# Patient Record
Sex: Male | Born: 1975
Health system: Southern US, Community
[De-identification: ages and names within clinical notes are randomized; demographics above are authoritative.]

## PROBLEM LIST (undated history)

## (undated) DIAGNOSIS — E11621 Type 2 diabetes mellitus with foot ulcer: Secondary | ICD-10-CM

## (undated) DIAGNOSIS — E349 Endocrine disorder, unspecified: Secondary | ICD-10-CM

## (undated) DIAGNOSIS — N183 Chronic kidney disease, stage 3 unspecified: Secondary | ICD-10-CM

## (undated) DIAGNOSIS — T8781 Dehiscence of amputation stump: Secondary | ICD-10-CM

## (undated) DIAGNOSIS — M869 Osteomyelitis, unspecified: Secondary | ICD-10-CM

## (undated) DIAGNOSIS — N289 Disorder of kidney and ureter, unspecified: Secondary | ICD-10-CM

## (undated) DIAGNOSIS — M5136 Other intervertebral disc degeneration, lumbar region: Secondary | ICD-10-CM

## (undated) DIAGNOSIS — K219 Gastro-esophageal reflux disease without esophagitis: Secondary | ICD-10-CM

## (undated) DIAGNOSIS — I999 Unspecified disorder of circulatory system: Secondary | ICD-10-CM

## (undated) DIAGNOSIS — E785 Hyperlipidemia, unspecified: Secondary | ICD-10-CM

## (undated) DIAGNOSIS — M51369 Other intervertebral disc degeneration, lumbar region without mention of lumbar back pain or lower extremity pain: Secondary | ICD-10-CM

## (undated) DIAGNOSIS — R519 Headache, unspecified: Secondary | ICD-10-CM

## (undated) DIAGNOSIS — K3184 Gastroparesis: Secondary | ICD-10-CM

## (undated) DIAGNOSIS — E119 Type 2 diabetes mellitus without complications: Secondary | ICD-10-CM

## (undated) DIAGNOSIS — D649 Anemia, unspecified: Secondary | ICD-10-CM

## (undated) DIAGNOSIS — I739 Peripheral vascular disease, unspecified: Secondary | ICD-10-CM

## (undated) DIAGNOSIS — F1910 Other psychoactive substance abuse, uncomplicated: Secondary | ICD-10-CM

## (undated) DIAGNOSIS — M86179 Other acute osteomyelitis, unspecified ankle and foot: Secondary | ICD-10-CM

## (undated) DIAGNOSIS — L97509 Non-pressure chronic ulcer of other part of unspecified foot with unspecified severity: Secondary | ICD-10-CM

## (undated) DIAGNOSIS — I10 Essential (primary) hypertension: Secondary | ICD-10-CM

## (undated) DIAGNOSIS — M359 Systemic involvement of connective tissue, unspecified: Secondary | ICD-10-CM

## (undated) DIAGNOSIS — Z89431 Acquired absence of right foot: Secondary | ICD-10-CM

## (undated) DIAGNOSIS — R51 Headache: Secondary | ICD-10-CM

## (undated) DIAGNOSIS — M86671 Other chronic osteomyelitis, right ankle and foot: Secondary | ICD-10-CM

## (undated) DIAGNOSIS — E1169 Type 2 diabetes mellitus with other specified complication: Secondary | ICD-10-CM

## (undated) DIAGNOSIS — K859 Acute pancreatitis without necrosis or infection, unspecified: Secondary | ICD-10-CM

## (undated) DIAGNOSIS — M6701 Short Achilles tendon (acquired), right ankle: Secondary | ICD-10-CM

## (undated) DIAGNOSIS — K449 Diaphragmatic hernia without obstruction or gangrene: Secondary | ICD-10-CM

## (undated) HISTORY — DX: Acquired absence of right foot: Z89.431

## (undated) HISTORY — DX: Osteomyelitis, unspecified: M86.9

## (undated) HISTORY — PX: LAPAROSCOPIC CHOLECYSTECTOMY: SUR755

## (undated) HISTORY — DX: Other chronic osteomyelitis, right ankle and foot: M86.671

## (undated) HISTORY — DX: Non-pressure chronic ulcer of other part of unspecified foot with unspecified severity: L97.509

## (undated) HISTORY — DX: Type 2 diabetes mellitus with other specified complication: E11.69

## (undated) HISTORY — DX: Type 2 diabetes mellitus with foot ulcer: E11.621

## (undated) HISTORY — DX: Short Achilles tendon (acquired), right ankle: M67.01

---

## 2002-08-02 ENCOUNTER — Emergency Department (HOSPITAL_COMMUNITY): Admission: EM | Admit: 2002-08-02 | Discharge: 2002-08-02 | Payer: Self-pay | Admitting: Emergency Medicine

## 2002-11-15 ENCOUNTER — Emergency Department (HOSPITAL_COMMUNITY): Admission: EM | Admit: 2002-11-15 | Discharge: 2002-11-15 | Payer: Self-pay | Admitting: Emergency Medicine

## 2004-04-12 ENCOUNTER — Emergency Department (HOSPITAL_COMMUNITY): Admission: EM | Admit: 2004-04-12 | Discharge: 2004-04-12 | Payer: Self-pay | Admitting: Emergency Medicine

## 2005-03-19 ENCOUNTER — Emergency Department (HOSPITAL_COMMUNITY): Admission: EM | Admit: 2005-03-19 | Discharge: 2005-03-19 | Payer: Self-pay | Admitting: Emergency Medicine

## 2005-08-20 ENCOUNTER — Inpatient Hospital Stay (HOSPITAL_COMMUNITY): Admission: EM | Admit: 2005-08-20 | Discharge: 2005-08-22 | Payer: Self-pay | Admitting: Emergency Medicine

## 2005-09-25 ENCOUNTER — Encounter (HOSPITAL_BASED_OUTPATIENT_CLINIC_OR_DEPARTMENT_OTHER): Admission: RE | Admit: 2005-09-25 | Discharge: 2005-10-14 | Payer: Self-pay | Admitting: Surgery

## 2006-04-22 ENCOUNTER — Ambulatory Visit: Payer: Self-pay | Admitting: Family Medicine

## 2006-04-23 ENCOUNTER — Ambulatory Visit: Payer: Self-pay | Admitting: *Deleted

## 2006-06-11 ENCOUNTER — Ambulatory Visit: Payer: Self-pay | Admitting: Nurse Practitioner

## 2006-07-18 ENCOUNTER — Emergency Department (HOSPITAL_COMMUNITY): Admission: EM | Admit: 2006-07-18 | Discharge: 2006-07-19 | Payer: Self-pay | Admitting: Emergency Medicine

## 2006-07-30 ENCOUNTER — Ambulatory Visit: Payer: Self-pay | Admitting: Family Medicine

## 2006-10-28 ENCOUNTER — Ambulatory Visit: Payer: Self-pay | Admitting: Family Medicine

## 2007-02-24 ENCOUNTER — Encounter (INDEPENDENT_AMBULATORY_CARE_PROVIDER_SITE_OTHER): Payer: Self-pay | Admitting: *Deleted

## 2007-04-26 ENCOUNTER — Emergency Department (HOSPITAL_COMMUNITY): Admission: EM | Admit: 2007-04-26 | Discharge: 2007-04-26 | Payer: Self-pay | Admitting: Emergency Medicine

## 2007-07-05 ENCOUNTER — Ambulatory Visit: Payer: Self-pay | Admitting: Internal Medicine

## 2007-07-26 ENCOUNTER — Emergency Department (HOSPITAL_COMMUNITY): Admission: EM | Admit: 2007-07-26 | Discharge: 2007-07-27 | Payer: Self-pay | Admitting: Emergency Medicine

## 2007-08-19 ENCOUNTER — Emergency Department (HOSPITAL_COMMUNITY): Admission: EM | Admit: 2007-08-19 | Discharge: 2007-08-19 | Payer: Self-pay | Admitting: Emergency Medicine

## 2007-08-26 ENCOUNTER — Emergency Department (HOSPITAL_COMMUNITY): Admission: EM | Admit: 2007-08-26 | Discharge: 2007-08-27 | Payer: Self-pay | Admitting: Emergency Medicine

## 2007-09-03 ENCOUNTER — Ambulatory Visit: Payer: Self-pay | Admitting: Internal Medicine

## 2007-10-04 ENCOUNTER — Emergency Department (HOSPITAL_COMMUNITY): Admission: EM | Admit: 2007-10-04 | Discharge: 2007-10-04 | Payer: Self-pay | Admitting: Emergency Medicine

## 2007-12-23 ENCOUNTER — Emergency Department (HOSPITAL_COMMUNITY): Admission: EM | Admit: 2007-12-23 | Discharge: 2007-12-24 | Payer: Self-pay | Admitting: Emergency Medicine

## 2008-02-01 ENCOUNTER — Emergency Department (HOSPITAL_COMMUNITY): Admission: EM | Admit: 2008-02-01 | Discharge: 2008-02-01 | Payer: Self-pay | Admitting: Emergency Medicine

## 2008-02-15 ENCOUNTER — Emergency Department (HOSPITAL_COMMUNITY): Admission: EM | Admit: 2008-02-15 | Discharge: 2008-02-15 | Payer: Self-pay | Admitting: Emergency Medicine

## 2008-02-25 ENCOUNTER — Encounter (HOSPITAL_BASED_OUTPATIENT_CLINIC_OR_DEPARTMENT_OTHER): Admission: RE | Admit: 2008-02-25 | Discharge: 2008-05-25 | Payer: Self-pay | Admitting: Internal Medicine

## 2008-03-23 ENCOUNTER — Ambulatory Visit (HOSPITAL_COMMUNITY): Admission: RE | Admit: 2008-03-23 | Discharge: 2008-03-23 | Payer: Self-pay | Admitting: Internal Medicine

## 2008-04-12 ENCOUNTER — Emergency Department (HOSPITAL_COMMUNITY): Admission: EM | Admit: 2008-04-12 | Discharge: 2008-04-12 | Payer: Self-pay | Admitting: Emergency Medicine

## 2008-04-27 ENCOUNTER — Encounter (INDEPENDENT_AMBULATORY_CARE_PROVIDER_SITE_OTHER): Payer: Self-pay | Admitting: Internal Medicine

## 2008-04-27 ENCOUNTER — Ambulatory Visit: Payer: Self-pay | Admitting: Family Medicine

## 2008-04-27 LAB — CONVERTED CEMR LAB
ALT: 22 units/L (ref 0–53)
AST: 14 units/L (ref 0–37)
Albumin: 3.8 g/dL (ref 3.5–5.2)
Alkaline Phosphatase: 80 units/L (ref 39–117)
CO2: 25 meq/L (ref 19–32)
Chloride: 97 meq/L (ref 96–112)
Glucose, Bld: 405 mg/dL — ABNORMAL HIGH (ref 70–99)
Sodium: 132 meq/L — ABNORMAL LOW (ref 135–145)
Total Protein: 8.2 g/dL (ref 6.0–8.3)

## 2008-05-26 ENCOUNTER — Encounter (HOSPITAL_BASED_OUTPATIENT_CLINIC_OR_DEPARTMENT_OTHER): Admission: RE | Admit: 2008-05-26 | Discharge: 2008-08-24 | Payer: Self-pay | Admitting: Internal Medicine

## 2008-05-31 ENCOUNTER — Ambulatory Visit: Payer: Self-pay | Admitting: Internal Medicine

## 2008-05-31 ENCOUNTER — Encounter: Payer: Self-pay | Admitting: Family Medicine

## 2008-05-31 LAB — CONVERTED CEMR LAB
LDL Cholesterol: 103 mg/dL — ABNORMAL HIGH (ref 0–99)
Triglycerides: 135 mg/dL (ref <150)

## 2009-01-16 ENCOUNTER — Emergency Department (HOSPITAL_COMMUNITY): Admission: EM | Admit: 2009-01-16 | Discharge: 2009-01-16 | Payer: Self-pay | Admitting: Emergency Medicine

## 2009-01-18 ENCOUNTER — Inpatient Hospital Stay (HOSPITAL_COMMUNITY): Admission: EM | Admit: 2009-01-18 | Discharge: 2009-01-21 | Payer: Self-pay | Admitting: Emergency Medicine

## 2009-06-04 ENCOUNTER — Emergency Department (HOSPITAL_COMMUNITY): Admission: EM | Admit: 2009-06-04 | Discharge: 2009-06-04 | Payer: Self-pay | Admitting: Emergency Medicine

## 2009-06-06 ENCOUNTER — Ambulatory Visit: Payer: Self-pay | Admitting: Internal Medicine

## 2009-06-06 ENCOUNTER — Encounter (INDEPENDENT_AMBULATORY_CARE_PROVIDER_SITE_OTHER): Payer: Self-pay | Admitting: Family Medicine

## 2009-06-06 LAB — CONVERTED CEMR LAB: Microalb, Ur: 26.44 mg/dL — ABNORMAL HIGH (ref 0.00–1.89)

## 2009-11-13 ENCOUNTER — Ambulatory Visit: Payer: Self-pay | Admitting: Family Medicine

## 2009-12-29 ENCOUNTER — Emergency Department (HOSPITAL_COMMUNITY): Admission: EM | Admit: 2009-12-29 | Discharge: 2009-12-29 | Payer: Self-pay | Admitting: Emergency Medicine

## 2010-01-16 ENCOUNTER — Emergency Department (HOSPITAL_COMMUNITY): Admission: EM | Admit: 2010-01-16 | Discharge: 2010-01-16 | Payer: Self-pay | Admitting: Emergency Medicine

## 2010-04-25 ENCOUNTER — Emergency Department (HOSPITAL_COMMUNITY): Admission: EM | Admit: 2010-04-25 | Discharge: 2010-04-26 | Payer: Self-pay | Admitting: Emergency Medicine

## 2010-04-26 ENCOUNTER — Encounter (INDEPENDENT_AMBULATORY_CARE_PROVIDER_SITE_OTHER): Payer: Self-pay | Admitting: *Deleted

## 2010-04-26 LAB — CONVERTED CEMR LAB
ALT: 17 units/L (ref 0–53)
BUN: 13 mg/dL (ref 6–23)
CO2: 29 meq/L (ref 19–32)
Calcium: 9.4 mg/dL (ref 8.4–10.5)
Creatinine, Ser: 0.93 mg/dL (ref 0.40–1.50)
LDL Cholesterol: 99 mg/dL (ref 0–99)
Potassium: 4.4 meq/L (ref 3.5–5.3)
Total Bilirubin: 0.4 mg/dL (ref 0.3–1.2)
Triglycerides: 35 mg/dL (ref <150)
VLDL: 7 mg/dL (ref 0–40)

## 2010-04-30 ENCOUNTER — Emergency Department (HOSPITAL_COMMUNITY): Admission: EM | Admit: 2010-04-30 | Discharge: 2010-04-30 | Payer: Self-pay | Admitting: Emergency Medicine

## 2010-05-01 ENCOUNTER — Emergency Department (HOSPITAL_COMMUNITY): Admission: EM | Admit: 2010-05-01 | Discharge: 2010-05-01 | Payer: Self-pay | Admitting: Emergency Medicine

## 2010-06-09 HISTORY — PX: TOE AMPUTATION: SHX809

## 2010-07-19 ENCOUNTER — Encounter: Payer: Self-pay | Admitting: Infectious Disease

## 2010-07-19 ENCOUNTER — Inpatient Hospital Stay (HOSPITAL_COMMUNITY)
Admission: EM | Admit: 2010-07-19 | Discharge: 2010-07-24 | DRG: 617 | Disposition: A | Discharge status: Home / Self Care | Payer: Medicaid Other | Attending: Internal Medicine | Admitting: Internal Medicine

## 2010-07-19 ENCOUNTER — Emergency Department (HOSPITAL_COMMUNITY): Payer: Medicaid Other

## 2010-07-19 DIAGNOSIS — M9080 Osteopathy in diseases classified elsewhere, unspecified site: Secondary | ICD-10-CM | POA: Diagnosis present

## 2010-07-19 DIAGNOSIS — E1169 Type 2 diabetes mellitus with other specified complication: Principal | ICD-10-CM | POA: Diagnosis present

## 2010-07-19 DIAGNOSIS — I10 Essential (primary) hypertension: Secondary | ICD-10-CM | POA: Diagnosis present

## 2010-07-19 DIAGNOSIS — F17200 Nicotine dependence, unspecified, uncomplicated: Secondary | ICD-10-CM | POA: Diagnosis present

## 2010-07-19 DIAGNOSIS — M869 Osteomyelitis, unspecified: Secondary | ICD-10-CM | POA: Diagnosis present

## 2010-07-20 LAB — DIFFERENTIAL
Basophils Absolute: 0 10*3/uL (ref 0.0–0.1)
Lymphocytes Relative: 32 % (ref 12–46)
Lymphs Abs: 2.6 10*3/uL (ref 0.7–4.0)
Monocytes Absolute: 0.5 10*3/uL (ref 0.1–1.0)
Monocytes Relative: 6 % (ref 3–12)
Neutrophils Relative %: 58 % (ref 43–77)

## 2010-07-20 LAB — BASIC METABOLIC PANEL
BUN: 11 mg/dL (ref 6–23)
Calcium: 9.2 mg/dL (ref 8.4–10.5)
Creatinine, Ser: 0.92 mg/dL (ref 0.4–1.5)
GFR calc non Af Amer: >60 mL/min (ref >60)
Potassium: 3.5 mEq/L (ref 3.5–5.1)

## 2010-07-20 LAB — GLUCOSE, CAPILLARY
Glucose-Capillary: 162 mg/dL — ABNORMAL HIGH (ref 70–99)
Glucose-Capillary: 167 mg/dL — ABNORMAL HIGH (ref 70–99)

## 2010-07-20 LAB — HEMOGLOBIN A1C: Mean Plasma Glucose: 146 mg/dL — ABNORMAL HIGH (ref <117)

## 2010-07-20 LAB — SEDIMENTATION RATE: Sed Rate: 24 mm/hr — ABNORMAL HIGH (ref 0–16)

## 2010-07-20 LAB — CBC
HCT: 37.6 % — ABNORMAL LOW (ref 39.0–52.0)
WBC: 8 10*3/uL (ref 4.0–10.5)

## 2010-07-20 LAB — MRSA PCR SCREENING: MRSA by PCR: NEGATIVE

## 2010-07-21 ENCOUNTER — Inpatient Hospital Stay (HOSPITAL_COMMUNITY): Payer: Medicaid Other

## 2010-07-21 DIAGNOSIS — L089 Local infection of the skin and subcutaneous tissue, unspecified: Secondary | ICD-10-CM

## 2010-07-21 DIAGNOSIS — E1169 Type 2 diabetes mellitus with other specified complication: Secondary | ICD-10-CM

## 2010-07-21 LAB — GLUCOSE, CAPILLARY
Glucose-Capillary: 105 mg/dL — ABNORMAL HIGH (ref 70–99)
Glucose-Capillary: 147 mg/dL — ABNORMAL HIGH (ref 70–99)
Glucose-Capillary: 121 mg/dL — ABNORMAL HIGH (ref 70–99)

## 2010-07-21 MED ORDER — GADOBENATE DIMEGLUMINE 529 MG/ML IV SOLN
20.0000 mL | Freq: Once | INTRAVENOUS | Status: AC
  Administered 2010-07-21: 20 mL via INTRAVENOUS

## 2010-07-21 NOTE — H&P (Signed)
NAMEYEIDEN, FRENKEL              ACCOUNT NO.:  [id number]  MEDICAL RECORD NO.:  [id number]           PATIENT TYPE:  E  LOCATION:  [facility]                         FACILITY:  [facility]  PHYSICIAN:  Hilary Hertz, MD      DATE OF BIRTH:  Feb 26, 1976  DATE OF ADMISSION:  07/19/2010                              HISTORY & PHYSICAL   PRIMARY CARE PHYSICIAN:  [facility].  CHIEF COMPLAINT:  Left toe pain.  HISTORY OF PRESENT ILLNESS:  The patient is a 35 year old man with a history of diabetes and hypertension who presents with a few day history of left toe pain after he stubbed his toe.  The patient reports that since that time he has had a clear drainage out of the tip of his left toe.  He denies any fevers.  He is walking.  He is able to walk around okay and no nausea, vomiting, or diarrhea.  No other specific symptoms.  REVIEW OF SYSTEMS:  Review of 10-organ systems was done and is negative except as stated above in the HPI.  ALLERGIES:  No known drug allergies.  MEDICATIONS: 1. Glipizide 10 mg daily. 2. Lisinopril 10 mg daily. 3. Metformin 500 mg twice a day.  PAST MEDICAL HISTORY:  Diabetes, hypertension, and cellulitis of right leg in 2010.  SOCIAL HISTORY:  He smokes 1 cigarette a day for the past year.  No alcohol use.  FAMILY HISTORY:  No family history of diabetes.  PHYSICAL EXAMINATION:  VITAL SIGNS:  Blood pressure 150/94, heart rate 83, respiratory rate 18, and temperature 97.7. GENERAL:  The patient is in no acute distress. HEENT:  Mucous membranes are moist.  Sclerae are anicteric. CARDIOVASCULAR:  Regular rate and rhythm.  No murmurs, rubs, or gallops. LUNGS:  Clear to auscultation bilaterally.  Nonlabored respiratory effort. ABDOMEN:  Obese, soft, nontender, and nondistended. EXTREMITIES:  Trace pedal edema.  The patient has scaling skin of bilateral lower extremities and the patient's bilateral feet has multiple areas on both sides of extensive calluses and  skin cracks as well as overgrown toenails bilaterally.  There is no overlying skin erythema or warmth.  On the plantar side of the patient's left great toe, there is an avulsed area of skin with no current active drainage. The skin surrounding that lesion is extensively callused and sloughing off.  Again, there is no appreciable surrounding skin change. NEURO:  Cranial nerves II-XII grossly intact.  Nonfocal. SKIN:  As above. PSYCHIATRIC:  Normal mood and affect.  LABORATORY DATA:  Sodium 139, potassium 3.5, chloride 107, bicarb 27, BUN 11, creatinine 0.92, and glucose 125.  White count 8, hemoglobin 12.5, and platelets 239.  Left toe x-ray shows soft tissue irregularity consistent with ulcerations of the first and second digits, irregularity of the top of the distal phalanx of the great toe, may indicate osteomyelitis.  ASSESSMENT AND PLAN:  The patient is a 35 year old man with hypertension and diabetes who presents with draining left toe ulceration, status post injury. 1. Left toe pain/draining ulceration:  This may have been due to the     recent trauma the patient describes or this may  just be diabetic     foot disease.  The patient's feet have been described in this     manner since his last admission in 2010.  The patient reports that     he does not see a podiatrist at this time.  I am concerned that     this is a diabetic foot infection, probable osteomyelitis given the     x-ray findings.  We will send blood cultures and get an MRI of the     patient's left foot to rule out osteomyelitis as well as check a     CRP and a sed rate.  We will start the patient on vancomycin and     Zosyn.  He does have a history of MRSA cellulitis.  We will make     sure the antibiotics are started after the blood cultures are     obtained.  We will obtain a surgery consult in the morning for     surgical evaluation of the patient's infection because it is     unlikely to resolve just with  antibiotics.  Wound care consult ordered. 2. Diabetes:  At this time, we will check hemoglobin A1c to assess the     patient's control of his diabetes.  We will hold his glipizide and     metformin for now in case for any contrast studies.  We will put     the patient on sliding scale insulin and we will need to arrange     podiatry followup for the patient as an outpatient for future care     of his feet as a preventative measure. 3. Hypertension:  We will continue the patient's home lisinopril. 4. Fluid, electrolytes, nutrition:  Hep-Lock IV.  Replete     electrolytes.  Diabetic diet. 5. Prophylaxis:  Lovenox for DVT prophylaxis. 6. Disposition:  The patient will be admitted for further workup.          ______________________________ Hilary Hertz, MD     JF/MEDQ  D:  07/20/2010  T:  07/20/2010  Job:  [id number]  Electronically Signed by Hilary Hertz MD on 07/20/2010 05:42:53 AM

## 2010-07-22 ENCOUNTER — Other Ambulatory Visit: Payer: Self-pay | Admitting: Orthopedic Surgery

## 2010-07-22 LAB — GLUCOSE, CAPILLARY
Glucose-Capillary: 99 mg/dL (ref 70–99)
Glucose-Capillary: 120 mg/dL — ABNORMAL HIGH (ref 70–99)
Glucose-Capillary: 88 mg/dL (ref 70–99)
Glucose-Capillary: 88 mg/dL (ref 70–99)

## 2010-07-23 DIAGNOSIS — E1169 Type 2 diabetes mellitus with other specified complication: Secondary | ICD-10-CM

## 2010-07-23 DIAGNOSIS — L089 Local infection of the skin and subcutaneous tissue, unspecified: Secondary | ICD-10-CM

## 2010-07-23 LAB — GLUCOSE, CAPILLARY
Glucose-Capillary: 76 mg/dL (ref 70–99)
Glucose-Capillary: 112 mg/dL — ABNORMAL HIGH (ref 70–99)
Glucose-Capillary: 108 mg/dL — ABNORMAL HIGH (ref 70–99)

## 2010-07-24 ENCOUNTER — Inpatient Hospital Stay (HOSPITAL_COMMUNITY): Payer: Medicaid Other

## 2010-07-24 LAB — CBC
HCT: 35 % — ABNORMAL LOW (ref 39.0–52.0)
Hemoglobin: 11.7 g/dL — ABNORMAL LOW (ref 13.0–17.0)
RDW: 14 % (ref 11.5–15.5)
WBC: 8.4 10*3/uL (ref 4.0–10.5)

## 2010-07-24 LAB — DIFFERENTIAL
Basophils Absolute: 0 10*3/uL (ref 0.0–0.1)
Basophils Relative: 0 % (ref 0–1)
Lymphocytes Relative: 24 % (ref 12–46)
Neutro Abs: 5.3 10*3/uL (ref 1.7–7.7)

## 2010-07-24 LAB — BASIC METABOLIC PANEL
CO2: 29 mEq/L (ref 19–32)
Calcium: 8.7 mg/dL (ref 8.4–10.5)
GFR calc Af Amer: >60 mL/min (ref >60)
GFR calc non Af Amer: >60 mL/min (ref >60)
Glucose, Bld: 105 mg/dL — ABNORMAL HIGH (ref 70–99)
Potassium: 3.8 mEq/L (ref 3.5–5.1)
Sodium: 137 mEq/L (ref 135–145)

## 2010-07-24 LAB — GLUCOSE, CAPILLARY

## 2010-07-26 ENCOUNTER — Telehealth: Payer: Self-pay | Admitting: Infectious Disease

## 2010-07-26 LAB — CULTURE, BLOOD (ROUTINE X 2): Culture: NO GROWTH

## 2010-07-26 LAB — TISSUE CULTURE

## 2010-07-27 LAB — ANAEROBIC CULTURE

## 2010-07-29 NOTE — Discharge Summary (Signed)
[name], [name]              ACCOUNT NO.:  [id number]  MEDICAL RECORD NO.:  [id number]           PATIENT TYPE:  I  LOCATION:  5151                         FACILITY:  [facility]  PHYSICIAN:  Curtis Massed, MD    DATE OF BIRTH:  August 03, 1975  DATE OF ADMISSION:  07/19/2010 DATE OF DISCHARGE:                        DISCHARGE SUMMARY - REFERRING   The patient's primary care practitioner is at [facility].  PRIMARY DISCHARGE DIAGNOSES: 1. Left great toe osteomyelitis status post left great toe amputation     through the midportion of the proximal phalanx. 2. The patient's secondary discharge diagnoses type 2 diabetes. 3. Hypertension.  DISCHARGE MEDICATIONS: 1. Levofloxacin 750 mg p.o. daily for 26 days. 2. Lisinopril 20 mg p.o. daily. 3. Percocet 5/325 1 tablet p.o. q.6 h. p.r.n. 4. Vancomycin 1 gram IV q.8 h. 5. Glipizide 10 mg 1 tablet p.o. daily. 6. Levemir 20 units at bedtime. 7. Metformin 500 mg 1 tablet p.o. twice daily. CONSULTANTS:  On the case, 1. Dr. August Clark from Orthopedics. 2. Dr. Daiva Clark and Dr. Orvan Clark from Infectious Disease.  BRIEF HPI:  The patient is a very pleasant 35 year old black male with history of diabetes and hypertension, who actually came in on July 19, 2010 for left foot pain after he had stubbed his toe.  He also reported drainage from the left toe.  He denied any nausea or vomiting. He was evaluated in the emergency room and found to have draining ulceration along with local cellulitis and then admitted to the hospitalist service for further evaluation and treatment.  For further details, please see the history and physical that was dictated by Dr. Lurline Clark on admission.  PERTINENT RADIOLOGICAL STUDIES:  MRI of the left foot with and without contrast shows cellulitis involving the great toe with osteomyelitis involving the distal phalanx.  PERTINENT LABORATORY DATA: 1. Tissue culture done on the July 22, 2010, shows gram-positive  cocci in pairs and the gram stain, but the culture results are     still pending. 2. Blood cultures done on the July 20, 2010, were negative. 3. Discharge laboratory data shows a WBC of 8.5, hemoglobin of 11.7,     and a platelet count of 241. 4. Discharge chemistry shows a sodium of 137, potassium of 3.8,     chloride of 102, bicarb of 29, BUN of 10, creatinine of 1.07, and a     calcium of 8.7. 5. HbA1c is 6.7. 6. ESR was 24.  BRIEF HOSPITAL COURSE: 1. Left great toe cellulitis and osteomyelitis.  The patient was     admitted and orthopedic consultation was obtained..  The patient     was then seen in consult by Dr. August Clark.  The patient then underwent a     left great toe amputation through the midportion of the proximal     phalanx on July 22, 2010.  Cultures are negative so far, but     the Gram-stain is positive for gram-positive cocci in pairs.  The     patient has been evaluated by Infectious Disease and initially the     patient was on vancomycin and Zosyn.  This  regimen has now been     changed to vancomycin and Levaquin.  A PICC line has been placed.     Current plans are to continue with vancomycin and Levaquin for 4     weeks.  Levaquin will be provided orally.  Vancomycin will be given     IV at home.  I have spoken with Dr. Daiva Clark.  He will follow up the     cultures and also follow up on the patient as well as an     outpatient.  The patient will need to make an appointment with Dr.     August Clark in around 5 to 6 days' time as an outpatient. 2. Diabetes.  The patient was placed on sliding scale regimen while in     the hospital, however, on discharge he will resume his usual     medications. 3. Hypertension.  His blood pressure was slightly uncontrolled and his     lisinopril has been increased to 20 mg.  DISPOSITION:  The patient will be discharged home.  FOLLOWUP INSTRUCTIONS: 1. The patient will follow up with Dr. Daiva Clark, he is to call [phone]     in the  next 1 or 2 weeks for an appointment. 2. Weekly CBC, chemistries, and vancomycin trough will need to be     faxed to Dr. Clinton Clark office as ordered by Dr. Daiva Clark in the     order sheet. 3. The patient will follow up with Dr. August Clark in the next 5 to 6 days'     time.  He is to call and make an appointment.  Dr. Diamantina Clark number     has been left on the pink sheet. 4. The patient should follow up with Dr. Audria Clark at [facility], who     was his primary care practitioner, in the next 1 or 2 weeks.  He is     to call and make an appointment. 5. The patient's tissue cultures is currently pending.  The patient's     HIV test is also currently pending and this will need to be     followed up as well.  Dr. Daiva Clark will follow this up.  Total time spent equals 45 minutes.     Curtis Massed, MD     SG/MEDQ  D:  07/24/2010  T:  07/24/2010  Job:  [id number]  Electronically Signed by [name]  on 07/29/2010 04:26:21 PM

## 2010-07-30 ENCOUNTER — Telehealth: Payer: Self-pay | Admitting: Infectious Disease

## 2010-07-30 ENCOUNTER — Encounter: Payer: Self-pay | Admitting: Infectious Disease

## 2010-07-30 DIAGNOSIS — M86179 Other acute osteomyelitis, unspecified ankle and foot: Secondary | ICD-10-CM | POA: Insufficient documentation

## 2010-07-30 DIAGNOSIS — A4101 Sepsis due to Methicillin susceptible Staphylococcus aureus: Secondary | ICD-10-CM | POA: Insufficient documentation

## 2010-07-30 HISTORY — DX: Other acute osteomyelitis, unspecified ankle and foot: M86.179

## 2010-08-06 NOTE — Progress Notes (Signed)
Summary: Care Plan OVersight  Phone Note Outgoing Call   Call placed by: Acey Lav MD,  July 30, 2010 6:02 PM Details for Reason: Care Plan Oversight Summary of Call: [phone] (30 or more mins)  I have supervised home care and/or infusion therapy for this pt, including providing orders for care, review of labs and/or home health care plans, communicating with the home health care professionals and/or patient/caregivers to integrate current information into the medical treatment plan and/or adjust the medical therapy. This supervision has been provided for _32_minutes during the calendar month. Dates for this oversight February 15th thru August 22, 2010  Treatment for MSSA osteomyeltiis Initial call taken by: Acey Lav MD,  July 30, 2010 6:03 PM  New Problems: METHICILLIN SUSCEPTIBLE STAPH AUREUS SEPTICEMIA (ICD-038.11) ACUTE OSTEOMYELITIS, ANKLE AND FOOT (ICD-730.07)   New Problems: METHICILLIN SUSCEPTIBLE STAPH AUREUS SEPTICEMIA (ICD-038.11) ACUTE OSTEOMYELITIS, ANKLE AND FOOT (ICD-730.07)

## 2010-08-06 NOTE — Progress Notes (Signed)
Summary: Want to change him to IV ANCEF  Phone Note Other Incoming   Caller: Fulton Mole [phone] Summary of Call: Patient is currently receiving IV Vanc, he was also d/c on Levaquin but can't afford bc its 700.00. Would the IV Vanc be enough or can he have something cheaper to replace the Levaquin? He is currently on charity case with [facility]. Initial call taken by: Starleen Arms CMA,  July 26, 2010 4:56 PM  Follow-up for Phone Call        He actually grew pure MSSA from his wound culture. Can we change him to cefazolin at 2 grams IV every 8 hours and stop the vancomycin and the levaquin? Follow-up by: Acey Lav MD,  July 30, 2010 4:05 PM

## 2010-08-07 NOTE — Consult Note (Signed)
Curtis, Clark              ACCOUNT NO.:  [id number]  MEDICAL RECORD NO.:  [id number]           PATIENT TYPE:  LOCATION:                                 FACILITY:  PHYSICIAN:  Burnard Bunting, [name]    DATE OF BIRTH:  May 10, 1976  DATE OF CONSULTATION: DATE OF DISCHARGE:                                CONSULTATION   CHIEF COMPLAINT:  Left great toe infection.  HISTORY OF PRESENT ILLNESS:  [name] is a 35 year old patient admitted to the hospital on July 19, 2010, with left toe pain.  The patient has a history of diabetes and hypertension after he stabbed his toe 2-3 days prior to admission.  Since that time, he has had some clear drainage out of the tip of his left toe.  Denies any fevers.  The patient is ambulatory.  He denies any other orthopedic complaints.  All systems reviewed are negative as they relate to the left toe.  He has no known drug allergies.  CURRENT MEDICATIONS: 1. Glipizide 10 mg daily. 2. Lisinopril 10 mg daily. 3. Metformin 500 mg twice a day.  Past medical history is notable for diabetes, hypertension, cellulitis of right leg in 2010.  The patient smokes one cigarette a day for the past year.  Denies any alcohol use.  No family history of diabetes.  On examination, he is well developed, well nourished in no acute distress, alert and oriented, answers questions appropriately.  Blood pressure 140/90, heart rate is 80, respiratory 18, temperature is 98. The patient is in no acute distress.  He has good cervical spine range of motion.  Extraocular movements intact.  Both feet are examined.  He has palpable pedal pulses, does have decreased sensation on the dorsoplantar aspect of his foot.  He does have a dry skin and fungal infection in the toes on the left-hand side.  He has got about a centimeter by centimeter area of ulceration on the plantar pad on the distal phalanx of his toe.  There is no real crepitus, induration, or cellulitis  noted around this region.  There is no lesser toe deformities.  No pitting edema in the lower extremities.  He does not have heel-cord contracture.  There is little bit of a plantar callus over the first metatarsal head but again this does not appear to be infected.  Laboratory data include sodium 139, potassium 3.5, BUN and creatinine 11 and 0.92.  White count on admission 8, hemoglobin 12.5.  Plain radiographs show ulcerations and MRI scan shows distal tuft osteomyelitis.  IMPRESSION:  Left distal tuft osteomyelitis in a 35 year old patient with diabetes.  Plan is to hold empiric antibiotics per Infectious Disease consult.  RECOMMENDATIONS:  Needs to have a distal tuft toe amputation performed which will be scheduled for today.  Lovenox was held yesterday.  The patient understands the risks and benefits of surgical intervention as well as need for diligent diabetes management.  All questions were answered.  Medical decision-making is complicated today by decision for surgery.     Burnard Bunting, [name]     GSD/MEDQ  D:  07/22/2010  T:  07/22/2010  Job:  [id number]  Electronically Signed by Reece Agar.  DEAN [name] on 08/07/2010 08:40:04 AM

## 2010-08-07 NOTE — Op Note (Signed)
  [name], [name]              ACCOUNT NO.:  [id number]  MEDICAL RECORD NO.:  [id number]           PATIENT TYPE:  LOCATION:                                 FACILITY:  PHYSICIAN:  Burnard Bunting, [name]    DATE OF BIRTH:  Apr 22, 1976  DATE OF PROCEDURE:  07/22/2010 DATE OF DISCHARGE:                              OPERATIVE REPORT   PREOPERATIVE DIAGNOSIS:  Left great toe osteomyelitis.  POSTOPERATIVE DIAGNOSIS:  Left great toe osteomyelitis.  PROCEDURE:  Left great toe amputation through the midportion of the proximal phalanx.  SURGEON:  Burnard Bunting, MD  ASSISTANT:  None.  ANESTHESIA:  LMA.  ESTIMATED BLOOD LOSS:  Minimal.  INDICATIONS:  [name] is a 36 year old patient with left toe osteomyelitis who presents for operative management after explanation of risks and benefits.  PROCEDURE IN DETAIL:  The patient was brought to the operating room where LMA anesthesia was induced.  Perioperative antibiotics were held until cultures were obtained.  Time-out was called.  Left foot was then pre-scrubbed with Hibiclens and saline, draped in a sterile manner.  An elliptical fish-mouth incision was then made centered with the distal limbs over the IP joint.  This was an area of where which maximize normal skin coverage.  The skin distal to these incisions were quite callus.  The skin and subcutaneous tissue were sharply divided.  IP joint was circumferentially incised.  Periosteal elevation was performed at distal end of the proximal phalanx proximally.  A saw was then used to remove about half of the distal phalanx in order to achieve a flap closure.  Dorsal surface was beveled.  Tourniquet was released. Bleeding points encountered and controlled using electrocautery.  A thorough irrigation was performed.  Capsule was then closed over the distal bone stump with a 3-0 Vicryl suture.  Skin flaps were then reapproximated using 3-0 nylon suture.  Bulky dressing was applied.   The patient tolerated the procedure well, without immediate complications.     Burnard Bunting, [name]    GSD/MEDQ  D:  07/22/2010  T:  07/23/2010  Job:  [id number]  Electronically Signed by Reece Agar.  Amorina Doerr [name] on 08/07/2010 08:40:01 AM

## 2010-08-15 NOTE — Miscellaneous (Signed)
Summary: [facility]: Infusion Therapy Orders  [facility]: Infusion Therapy Orders   Imported By: Florinda Marker 08/07/2010 15:41:36  _____________________________________________________________________  External Attachment:    Type:   Image     Comment:   External Document

## 2010-08-15 NOTE — Medication Information (Signed)
Summary: [facility]: RX  [facility]: RX   Imported By: Florinda Marker 08/06/2010 15:47:24  _____________________________________________________________________  External Attachment:    Type:   Image     Comment:   External Document

## 2010-09-09 LAB — DIFFERENTIAL
Basophils Absolute: 0 10*3/uL (ref 0.0–0.1)
Lymphocytes Relative: 11 % — ABNORMAL LOW (ref 12–46)
Neutro Abs: 9.7 10*3/uL — ABNORMAL HIGH (ref 1.7–7.7)

## 2010-09-09 LAB — COMPREHENSIVE METABOLIC PANEL WITH GFR
ALT: 18 U/L (ref 0–53)
AST: 20 U/L (ref 0–37)
Albumin: 3.6 g/dL (ref 3.5–5.2)
Alkaline Phosphatase: 83 U/L (ref 39–117)
BUN: 10 mg/dL (ref 6–23)
CO2: 28 meq/L (ref 19–32)
Calcium: 9.4 mg/dL (ref 8.4–10.5)
Chloride: 102 meq/L (ref 96–112)
Creatinine, Ser: 0.86 mg/dL (ref 0.4–1.5)
GFR calc non Af Amer: >60 mL/min
Glucose, Bld: 217 mg/dL — ABNORMAL HIGH (ref 70–99)
Potassium: 3.9 meq/L (ref 3.5–5.1)
Sodium: 139 meq/L (ref 135–145)
Total Bilirubin: 0.8 mg/dL (ref 0.3–1.2)
Total Protein: 8.6 g/dL — ABNORMAL HIGH (ref 6.0–8.3)

## 2010-09-09 LAB — CBC
HCT: 41.2 % (ref 39.0–52.0)
Hemoglobin: 14 g/dL (ref 13.0–17.0)
MCHC: 33.9 g/dL (ref 30.0–36.0)
MCV: 87.7 fL (ref 78.0–100.0)
Platelets: 276 K/uL (ref 150–400)
RBC: 4.7 MIL/uL (ref 4.22–5.81)
RDW: 13.8 % (ref 11.5–15.5)
WBC: 11.3 K/uL — ABNORMAL HIGH (ref 4.0–10.5)

## 2010-09-09 LAB — LIPASE, BLOOD: Lipase: 14 U/L (ref 11–59)

## 2010-09-14 LAB — GLUCOSE, CAPILLARY
Glucose-Capillary: 145 mg/dL — ABNORMAL HIGH (ref 70–99)
Glucose-Capillary: 151 mg/dL — ABNORMAL HIGH (ref 70–99)
Glucose-Capillary: 151 mg/dL — ABNORMAL HIGH (ref 70–99)
Glucose-Capillary: 201 mg/dL — ABNORMAL HIGH (ref 70–99)
Glucose-Capillary: 231 mg/dL — ABNORMAL HIGH (ref 70–99)
Glucose-Capillary: 254 mg/dL — ABNORMAL HIGH (ref 70–99)
Glucose-Capillary: 259 mg/dL — ABNORMAL HIGH (ref 70–99)
Glucose-Capillary: 94 mg/dL (ref 70–99)
Glucose-Capillary: 95 mg/dL (ref 70–99)

## 2010-09-14 LAB — COMPREHENSIVE METABOLIC PANEL
AST: 19 U/L (ref 0–37)
Albumin: 2.9 g/dL — ABNORMAL LOW (ref 3.5–5.2)
Calcium: 9 mg/dL (ref 8.4–10.5)
Creatinine, Ser: 1.14 mg/dL (ref 0.4–1.5)
GFR calc Af Amer: >60 mL/min (ref >60)
GFR calc non Af Amer: >60 mL/min (ref >60)

## 2010-09-14 LAB — URINALYSIS, ROUTINE W REFLEX MICROSCOPIC
Ketones, ur: 15 mg/dL — AB
Nitrite: NEGATIVE
Protein, ur: 100 mg/dL — AB
Specific Gravity, Urine: 1.029 (ref 1.005–1.030)
Urobilinogen, UA: 4 mg/dL — ABNORMAL HIGH (ref 0.0–1.0)

## 2010-09-14 LAB — CULTURE, BLOOD (ROUTINE X 2): Culture: NO GROWTH

## 2010-09-14 LAB — CBC
Hemoglobin: 10.5 g/dL — ABNORMAL LOW (ref 13.0–17.0)
Hemoglobin: 10.5 g/dL — ABNORMAL LOW (ref 13.0–17.0)
MCHC: 33.2 g/dL (ref 30.0–36.0)
MCHC: 33.5 g/dL (ref 30.0–36.0)
MCHC: 33.6 g/dL (ref 30.0–36.0)
MCV: 87.9 fL (ref 78.0–100.0)
Platelets: 295 10*3/uL (ref 150–400)
RBC: 3.52 MIL/uL — ABNORMAL LOW (ref 4.22–5.81)
RBC: 3.54 MIL/uL — ABNORMAL LOW (ref 4.22–5.81)
WBC: 11 10*3/uL — ABNORMAL HIGH (ref 4.0–10.5)

## 2010-09-14 LAB — URINE MICROSCOPIC-ADD ON

## 2010-09-14 LAB — DIFFERENTIAL
Eosinophils Relative: 0 % (ref 0–5)
Lymphocytes Relative: 12 % (ref 12–46)
Lymphs Abs: 1.6 10*3/uL (ref 0.7–4.0)
Monocytes Absolute: 1.3 10*3/uL — ABNORMAL HIGH (ref 0.1–1.0)
Neutro Abs: 10.7 10*3/uL — ABNORMAL HIGH (ref 1.7–7.7)

## 2010-09-14 LAB — BASIC METABOLIC PANEL
Calcium: 8.7 mg/dL (ref 8.4–10.5)
Creatinine, Ser: 0.77 mg/dL (ref 0.4–1.5)
GFR calc Af Amer: >60 mL/min (ref >60)
GFR calc non Af Amer: >60 mL/min (ref >60)
Sodium: 134 mEq/L — ABNORMAL LOW (ref 135–145)

## 2010-09-14 LAB — IRON AND TIBC
Saturation Ratios: 16 % — ABNORMAL LOW (ref 20–55)
TIBC: 151 ug/dL — ABNORMAL LOW (ref 215–435)
UIBC: 127 ug/dL

## 2010-09-14 LAB — FERRITIN: Ferritin: 368 ng/mL — ABNORMAL HIGH (ref 22–322)

## 2010-09-14 LAB — CULTURE, ROUTINE-ABSCESS

## 2010-09-14 LAB — FOLATE: Folate: 6.3 ng/mL

## 2010-09-14 LAB — HEMOGLOBIN A1C
Hgb A1c MFr Bld: 10.6 % — ABNORMAL HIGH (ref 4.6–6.1)
Mean Plasma Glucose: 258 mg/dL

## 2010-09-14 LAB — RETICULOCYTES: Retic Count, Absolute: 33 10*3/uL (ref 19.0–186.0)

## 2010-09-14 LAB — TSH: TSH: 1.526 u[IU]/mL (ref 0.350–4.500)

## 2010-09-14 LAB — T4, FREE: Free T4: 0.87 ng/dL (ref 0.80–1.80)

## 2010-09-16 ENCOUNTER — Ambulatory Visit: Payer: Self-pay | Admitting: Infectious Disease

## 2010-09-20 ENCOUNTER — Ambulatory Visit (INDEPENDENT_AMBULATORY_CARE_PROVIDER_SITE_OTHER): Payer: Self-pay | Admitting: Infectious Disease

## 2010-09-20 ENCOUNTER — Encounter: Payer: Self-pay | Admitting: Infectious Disease

## 2010-09-20 VITALS — BP 164/111 | HR 79 | Temp 98.2°F | Ht 73.0 in | Wt 232.0 lb

## 2010-09-20 DIAGNOSIS — M869 Osteomyelitis, unspecified: Secondary | ICD-10-CM

## 2010-09-20 DIAGNOSIS — M86179 Other acute osteomyelitis, unspecified ankle and foot: Secondary | ICD-10-CM

## 2010-09-20 DIAGNOSIS — E119 Type 2 diabetes mellitus without complications: Secondary | ICD-10-CM

## 2010-09-20 DIAGNOSIS — B351 Tinea unguium: Secondary | ICD-10-CM | POA: Insufficient documentation

## 2010-09-20 DIAGNOSIS — A4101 Sepsis due to Methicillin susceptible Staphylococcus aureus: Secondary | ICD-10-CM

## 2010-09-20 NOTE — Progress Notes (Signed)
  Subjective:    Patient ID: [name], male    DOB: May 21, 1976, 35 y.o.   MRN: [id number]  HPI  35 year old [place] American male with history of diabetes mellitus who presented to [facility] along with a with osteomyelitis involving his left great toe. He was given pre-operative antibiotics by the hospitalist team there were broad including vancomycin and Zosyn. Dr August Saucer saw the patient  and performed  a Left great toe amputation through the midportion of the   proximal phalanx. Intraoperative cultures subsequently grew grew methicillin sensitive staph not aureus. The patient was transitioned from vancomycin and Levaquin to intravenous cefazolin as an as remained on this through this current date. His pain has resolved. He has no drainage from his foot. He has no fevers chills night sweats nausea, vomiting or malaise. His weight is improved.   Review of Systems As per history of present illness otherwise 12 point review of systems is negative    Objective:   Physical Exam Patient is alert and oriented x4. HEENT is normocephalic atraumatic his pupils are equal round reactive to light his vision is grossly intact. Oropharynx is clear his neck is supple his cardiovascular exam reveals a regular rate and rhythm no murmurs or rubs his lungs are clear to auscultation bilaterally without wheezes rhonchi or rales abdomen soft nondistended nontender positive bowel sounds. Extremities. Patient's left great toe indication site is clean dry and intact without purulence or or fluctuance. His toenails are heavily infected with the fundus and are with onychomycosis. His soles are heavily calloused. Bilaterally. There is no other drainable lesion. PMH of his PICC line reveals to be clean dry and intact without purulence or fluctuance. Neurological he is not a nonfocal exam. His gait is normal.   Assessment & Plan:  ACUTE OSTEOMYELITIS, ANKLE AND FOOT We will discontinue his cefazolin and pulled his PICC line. I  will check a sedimentation rate and C-reactive protein. If these are encouraging we'll observe him off antibiotics and have him return to clinic.  METHICILLIN SUSCEPTIBLE STAPH AUREUS SEPTICEMIA Will consider decontamination regimen in the future certainly he should have this before any elective surgery.  Diabetes mellitus He is followed at [facility]. He'll need to better control his diabetes. He needs to get diabetic shoes be followed by podiatry.  Onychomycosis Benefit from terbinafine.

## 2010-09-20 NOTE — Assessment & Plan Note (Signed)
He is followed at [facility]. He'll need to better control his diabetes. He needs to get diabetic shoes be followed by podiatry.

## 2010-09-20 NOTE — Assessment & Plan Note (Signed)
Will consider decontamination regimen in the future certainly he should have this before any elective surgery.

## 2010-09-20 NOTE — Assessment & Plan Note (Signed)
Benefit from terbinafine.

## 2010-09-20 NOTE — Assessment & Plan Note (Signed)
We will discontinue his cefazolin and pulled his PICC line. I will check a sedimentation rate and C-reactive protein. If these are encouraging we'll observe him off antibiotics and have him return to clinic.

## 2010-10-10 ENCOUNTER — Encounter: Payer: Self-pay | Admitting: Infectious Disease

## 2010-10-13 ENCOUNTER — Emergency Department (HOSPITAL_COMMUNITY): Payer: Medicaid Other

## 2010-10-13 ENCOUNTER — Inpatient Hospital Stay (HOSPITAL_COMMUNITY)
Admission: EM | Admit: 2010-10-13 | Discharge: 2010-10-18 | DRG: 639 | Disposition: A | Discharge status: Home / Self Care | Payer: Medicaid Other | Source: Ambulatory Visit | Attending: Internal Medicine | Admitting: Internal Medicine

## 2010-10-13 DIAGNOSIS — Z7982 Long term (current) use of aspirin: Secondary | ICD-10-CM

## 2010-10-13 DIAGNOSIS — I10 Essential (primary) hypertension: Secondary | ICD-10-CM | POA: Diagnosis present

## 2010-10-13 DIAGNOSIS — S98119A Complete traumatic amputation of unspecified great toe, initial encounter: Secondary | ICD-10-CM

## 2010-10-13 DIAGNOSIS — R112 Nausea with vomiting, unspecified: Secondary | ICD-10-CM | POA: Diagnosis not present

## 2010-10-13 DIAGNOSIS — F17200 Nicotine dependence, unspecified, uncomplicated: Secondary | ICD-10-CM | POA: Diagnosis present

## 2010-10-13 DIAGNOSIS — E1169 Type 2 diabetes mellitus with other specified complication: Principal | ICD-10-CM | POA: Diagnosis present

## 2010-10-13 DIAGNOSIS — L02619 Cutaneous abscess of unspecified foot: Secondary | ICD-10-CM | POA: Diagnosis present

## 2010-10-13 DIAGNOSIS — R51 Headache: Secondary | ICD-10-CM | POA: Diagnosis not present

## 2010-10-13 DIAGNOSIS — L03039 Cellulitis of unspecified toe: Secondary | ICD-10-CM | POA: Diagnosis present

## 2010-10-14 LAB — DIFFERENTIAL
Basophils Relative: 0 % (ref 0–1)
Eosinophils Absolute: 0.3 10*3/uL (ref 0.0–0.7)
Lymphs Abs: 3.1 10*3/uL (ref 0.7–4.0)
Monocytes Absolute: 0.8 10*3/uL (ref 0.1–1.0)
Monocytes Relative: 7 % (ref 3–12)

## 2010-10-14 LAB — URINALYSIS, ROUTINE W REFLEX MICROSCOPIC
Glucose, UA: NEGATIVE mg/dL
Hgb urine dipstick: NEGATIVE
Protein, ur: 30 mg/dL — AB
pH: 5 (ref 5.0–8.0)

## 2010-10-14 LAB — URINE MICROSCOPIC-ADD ON

## 2010-10-14 LAB — GLUCOSE, CAPILLARY: Glucose-Capillary: 126 mg/dL — ABNORMAL HIGH (ref 70–99)

## 2010-10-14 LAB — BASIC METABOLIC PANEL
BUN: 19 mg/dL (ref 6–23)
CO2: 29 mEq/L (ref 19–32)
Chloride: 102 mEq/L (ref 96–112)
Creatinine, Ser: 0.98 mg/dL (ref 0.4–1.5)
GFR calc Af Amer: >60 mL/min (ref >60)
Potassium: 3.6 mEq/L (ref 3.5–5.1)

## 2010-10-14 LAB — HEMOGLOBIN A1C
Hgb A1c MFr Bld: 6.9 % — ABNORMAL HIGH (ref <5.7)
Mean Plasma Glucose: 151 mg/dL — ABNORMAL HIGH (ref <117)

## 2010-10-14 LAB — CBC
Hemoglobin: 12 g/dL — ABNORMAL LOW (ref 13.0–17.0)
MCH: 29.1 pg (ref 26.0–34.0)
MCHC: 33.5 g/dL (ref 30.0–36.0)
MCV: 86.9 fL (ref 78.0–100.0)
Platelets: 235 10*3/uL (ref 150–400)

## 2010-10-14 NOTE — H&P (Signed)
[name], [name]              ACCOUNT NO.:  [id number]  MEDICAL RECORD NO.:  [id number]           PATIENT TYPE:  E  LOCATION:  [facility]                         FACILITY:  [facility]  PHYSICIAN:  Houston Siren, MD           DATE OF BIRTH:  1975/06/27  DATE OF ADMISSION:  10/13/2010 DATE OF DISCHARGE:                             HISTORY & PHYSICAL   PRIMARY CARE PHYSICIAN:  [facility].  ORTHOPEDICS:  Burnard Bunting, MD  INFECTIOUS DISEASE:  Acey Lav, MD  ADVANCE DIRECTIVE:  Full code.  REASON FOR ADMISSION:  Diabetic cellulitis, and toe necrosis.  HISTORY OF PRESENT ILLNESS:  This is a 35 year old male with history of hypertension, diabetes, and unfortunately active tobacco user, presented with swelling of his left second toe and ankle along with pain.  This has progressed over only a few days according to him.  There is some purulent discharge over the toe as well.  He has no systemic illness. He denied any fever, chills, nausea, vomiting, diaphoresis or any other symptomatology.  Workup in the emergency room included a creatinine of 0.98, a white count of 10,500.  His x-rays showed partial first toe amputation and second toe with swelling and gas foci consistent with infection.  Hospitalist was asked to admit the patient for diabetic cellulitis and second left toe necrosis.  PAST MEDICAL HISTORY: 1. Hypertension. 2. Diabetes. 3. Prior toe amputation.  SOCIAL HISTORY:  He denied tobacco, alcohol, or drug use.  ALLERGIES:  No known drug allergies.  CURRENT MEDICATIONS:  Glipizide, metformin, and lisinopril.  Dosages unclear.  PHYSICAL EXAMINATION:  VITAL SIGNS:  Blood pressure 160/90, pulse of 82, respiratory rate of 20, temperature 97.8. GENERAL:  He is alert, oriented, and conversing. HEENT:  Sclerae are nonicteric.  He does not look toxic.  Throat is clear. NECK:  Supple.  No stridor. CARDIAC:  S1, S2 regular.  I do not hear any murmur, rub, or gallop. LUNGS:   Clear. ABDOMEN:  Soft, nondistended, and nontender. EXTREMITIES:  Right leg was unremarkable.  No swelling.  Left leg with swollen ankle all the way to approximately third down from his lower extremity.  The second toe is necrotic looking with some crusty and purulent exudate.  He does have good distal pulses bilaterally.  OBJECTIVE FINDINGS:  X-ray and lab work as noted above.  IMPRESSION:  This is a 35 year old with diabetes, hypertension, tobacco use, who had lost partial left great toe, now presented with infection and having gas foci on his x-ray, likely having a diabetic cellulitis and necrosis.  We will admit and continue vancomycin and Zosyn given in the emergency room.  We will give him some pain medication as well.  I suspect that he might need amputation, so please consult Orthopedics in the morning.  In the interim, I will make him n.p.o., give him intravenous fluids, and hold his metformin and glipizide.  If his blood glucose is elevated, we will use insulin sliding scale.  He is a full code, will be admitted to [facility].  I will continue his ACE inhibitor.  It will  serve him well to quit his tobacco use indefinitely.     Houston Siren, MD     PL/MEDQ  D:  10/14/2010  T:  10/14/2010  Job:  [id number]  Electronically Signed by Houston Siren  on 10/14/2010 04:44:30 AM

## 2010-10-15 LAB — CBC
HCT: 31.7 % — ABNORMAL LOW (ref 39.0–52.0)
Hemoglobin: 10.7 g/dL — ABNORMAL LOW (ref 13.0–17.0)
MCH: 29.4 pg (ref 26.0–34.0)
MCV: 87.1 fL (ref 78.0–100.0)
RBC: 3.64 MIL/uL — ABNORMAL LOW (ref 4.22–5.81)
WBC: 5.8 10*3/uL (ref 4.0–10.5)

## 2010-10-15 LAB — BASIC METABOLIC PANEL
BUN: 14 mg/dL (ref 6–23)
CO2: 30 mEq/L (ref 19–32)
Chloride: 101 mEq/L (ref 96–112)
Creatinine, Ser: 0.99 mg/dL (ref 0.4–1.5)
Potassium: 3.7 mEq/L (ref 3.5–5.1)

## 2010-10-15 LAB — GLUCOSE, CAPILLARY
Glucose-Capillary: 101 mg/dL — ABNORMAL HIGH (ref 70–99)
Glucose-Capillary: 96 mg/dL (ref 70–99)

## 2010-10-16 LAB — GLUCOSE, CAPILLARY
Glucose-Capillary: 106 mg/dL — ABNORMAL HIGH (ref 70–99)
Glucose-Capillary: 100 mg/dL — ABNORMAL HIGH (ref 70–99)
Glucose-Capillary: 120 mg/dL — ABNORMAL HIGH (ref 70–99)

## 2010-10-17 LAB — GLUCOSE, CAPILLARY
Glucose-Capillary: 104 mg/dL — ABNORMAL HIGH (ref 70–99)
Glucose-Capillary: 99 mg/dL (ref 70–99)
Glucose-Capillary: 98 mg/dL (ref 70–99)

## 2010-10-18 LAB — GLUCOSE, CAPILLARY
Glucose-Capillary: 127 mg/dL — ABNORMAL HIGH (ref 70–99)
Glucose-Capillary: 96 mg/dL (ref 70–99)

## 2010-10-21 ENCOUNTER — Encounter: Payer: Self-pay | Admitting: Infectious Disease

## 2010-10-22 NOTE — Group Therapy Note (Signed)
NAMEARIF, AMENDOLA              ACCOUNT NO.:  [id number]   MEDICAL RECORD NO.:  [id number]           PATIENT TYPE:   LOCATION:                                 FACILITY:   PHYSICIAN:  Lenon Curt. Chilton Si, [name]  DATE OF BIRTH:  [birth year]                                 PROGRESS NOTE   HISTORY:  A 35 year old male returns for inspection of an ulceration/wound of the  tip of his right great toe, reportedly sustained in a basketball game.  He says that the wound is doing well.  He has no discomfort there and  there has been little drainage.   Diabetic control continues to be poor with all capillary glucoses  greater than 200 mg% despite rise in his Levemir to 25 units nightly.   EXAMINATION:  1. Temperature 97.6, pulse 84, respirations 16, and blood pressure      136/87.  Capillary glucose 201.  2. Wound, right great toe, measures 0.5 x 0.7 x 0.2 and surrounded by      a significant amount of callus still despite prior debridements.   PLAN:  Wound again was debrided of extensive callus that surrounds it.  We were  down to new tissue in virtually every dimension following this.  The  wound itself appears to be doing well and may be beginning to pucker in  and heal.  There is no evidence of infection.   The dressings included Neosporin, gauze, and a toe sack.  The patient  advised to return in 2 weeks for, hopefully, a final checkout visit.  Return visit was to be sure that callus was not continuing to impede the  healing of this wound.      Lenon Curt Chilton Si, [name]  Electronically Signed     AGG/MEDQ  D:  03/31/2008  T:  04/01/2008  Job:  [id number]

## 2010-10-22 NOTE — Assessment & Plan Note (Signed)
[facility]   NAMEMarland Clark  PARKE, JANDREAU              ACCOUNT NO.:  [id number]   MEDICAL RECORD NO.:  [id number]      DATE OF BIRTH:  [birth year]   PHYSICIAN:  Lenon Curt. Chilton Si, [name]   VISIT DATE:  04/14/2008                                   OFFICE VISIT   HISTORY:  A 35 year old with ulceration at the end of his right great  toe, returns for a recheck, last seen by me 2 weeks ago.  Wound was  doing reasonably well at that time, has been covered over with more  callus since his last visitation.  Denies any pain, minimal drainage.   Incidental finding is also made of a spider bite with large abscess of  the back of his head.  His primary care physician is taking care of this  and Wound Care Clinic is not involved.   EXAM:  Temperature 97.6, pulse 87, respirations 20, blood pressure  131/87.  Wound measurements of wound #2 right great toe are 0.6 x 0.9 x  0.1 cm depth.  The wound is clean.  There is good granulation tissue at  the base.  There is a considerable amount of overlying callus hiding  some of the wounds.  The following debridement, the wound actually  appeared little larger than was initially felt to be.  However, the  tissues are in good shape and healing him very nicely.   TREATMENT:  A selective debridement was done with the scalpel.  The  patient tolerated the procedure well.  Neosporin ointment was applied as  well as gauze.  The patient was given instructions on how to protect  this area.  He is to return in 2 weeks hopefully for a final check of  this wound.   CPT code, 16109 and [phone].  ICD-9 code, 707.15 ulcer of other part of foot.      Lenon Curt Chilton Si, [name]  Electronically Signed     AGG/MEDQ  D:  04/14/2008  T:  04/14/2008  Job:  [id number]

## 2010-10-22 NOTE — H&P (Signed)
[name], KRISE NO.:  [id number]   MEDICAL RECORD NO.:  [id number]          PATIENT TYPE:  EMS   LOCATION:  MAJO                         FACILITY:  [facility]   PHYSICIAN:  Vania Rea, [name] DATE OF BIRTH:  [birth year]   DATE OF ADMISSION:  01/17/2009  DATE OF DISCHARGE:                              HISTORY & PHYSICAL   PRIMARY CARE PHYSICIAN:  Dr. Audria Nine at [facility].   CHIEF COMPLAINT:  Recurrent boils.   HISTORY OF PRESENT ILLNESS:  This is a 35 year old African American  gentleman with a history of diabetes, type 2 for the past 15 years who  is maintained on Levemir as well as oral antidiabetic medication but  whose blood sugar tends to run in the 200s each morning.  He has been  having recurrent boils for the past one month.  The patient says the  boil started in his axilla, but cleared up but for the past 10 days has  been having abscess boil that has started on his right knee and has been  getting worse.  The patient was seen in the emergency room yesterday,  was started on Bactrim and Keflex and returned for evaluation today and  has a fever and degree of the inflammation around his knee has gotten  markedly worse and the hospitalist service was called to assist with  management.  Apart from the pain in the knee and the fevers the patient  does not complain of any other issues.  He denies frequency, dysuria or  increasing thirst.   The patient does have a past history of right toe infection secondary to  trauma which took a long time to heal as an outpatient.   PAST MEDICAL HISTORY:  1. Diabetes.  2. Hypertension,   MEDICATIONS:  1. Keflex 500 mg three times daily since yesterday.  2. Bactrim double strength one tablet twice daily since yesterday.  3. Metformin 500 mg twice daily.  4. Glucotrol 10 mg twice daily.  5. Lisinopril 10 mg daily.  6. Levemir 30 units at bedtime.   ALLERGIES:  NO KNOWN DRUG ALLERGIES.   SOCIAL HISTORY:  He  works as a Financial risk analyst at [employer]. He lives with his  sister.  He does have a fiancee' but he is not married.  He denies  tobacco, alcohol or illicit drug use.   FAMILY HISTORY:  Significant for diabetes and hypertension.   REVIEW OF SYSTEMS:  Other than noted above a 10-point review of systems  did not reveal any significant information other than his girlfriend  insists that he does not take his medications regularly and she feels  this is why his blood sugars are frequently uncontrolled.   PHYSICAL EXAMINATION:  GENERAL:  This is an obese young African American  gentleman lying in the stretcher in no acute distress.  VITAL SIGNS:  His temperature is 100.3, his pulse is 114, respirations 16, blood  pressure 132/72, saturating at 100% on room air.  HEENT:  His pupils are round, equal, and reactive.  Mucous membranes are  pink and anicteric.  He is not dehydrated.  NECK:  No cervical lymphadenopathy or thyromegaly.  No jugular venous  distention.  No carotid bruits  CHEST:  Clear to auscultation bilaterally.  CARDIOVASCULAR:  Regular rhythm.  No murmur.  ABDOMEN:  Soft and nontender.  No masses.  EXTREMITIES:  He has a healing boil on the left elbow.  He has a scabbed  over boil on his right knee with extensive cellulitis surrounding this  area about four inches in diameter and swollen and hot right leg.  Dorsalis pedis pulses are 3+ on the left and 2+ on the right.  He has no  bony joint deformity.  Some of his toenails are overgrown and need  cutting.  He has calluses on the balls of both feet, the left greater  than right and also on the pads of the great toes bilaterally.  He has  multiple moderate-sized lymph nodes in the right inguinal region.  They  are nontender.  He has shotty left axillary lymph nodes.  SKIN:  His skin has boils and cellulitis as noted above.  CENTRAL NERVOUS SYSTEM:  Cranial nerves II-XII are grossly intact.  He  has no focal lateralizing signs.   LABORATORY  DATA:  His white count is 13.6, hemoglobin 11.3, platelets  295, absolute neutrophil count is 10.7.  Sodium 132, potassium 3.9,  chloride 96, CO2 26, glucose 249, BUN 11, creatinine 1.04.  Liver  functions are normal with the exception of albumin of 2.9 with a total  protein of 8.  Urinalysis showed cloudy urine with a specific gravity of  1.029, 100 protein, nitrite and leukocyte esterase negative.  Microscopy  shows no white cells or red cells and rare bacteria.   ASSESSMENT:  1. Cellulitis of the right leg.  2. Recurrent boils.  3. Diabetes type 2, uncontrolled.  4. Hypertension, controlled.  5. Obesity.  6. Probable diabetic neuropathy, given multiple calluses on his feet.  7. Diabetic nephropathy.   PLAN:  Will admit this gentleman for intravenous antibiotic treatment  with vancomycin and Rocephin after blood cultures.  Will counsel him on  the importance of tight diabetes control to prevent vascular  complications. the importance of ophthalmology and podiatry follow-up  every six months.  Other plans as per orders.      Vania Rea, [name]  Electronically Signed     LC/MEDQ  D:  01/17/2009  T:  01/18/2009  Job:  [id number]   cc:   Dr. Audria Nine

## 2010-10-22 NOTE — Assessment & Plan Note (Signed)
[facility]   NAMEMarland Clark  [name], [name]              ACCOUNT NO.:  [id number]   MEDICAL RECORD NO.:  [id number]      DATE OF BIRTH:  [birth year]   PHYSICIAN:  Lenon Curt. Chilton Si, [name]        VISIT DATE:                                   OFFICE VISIT   HISTORY:  A 35 year old male with a deep ulceration at the tip of his  left great toe which he states was brought on by playing basketball.  This is his second visit.  He had some selective debridement done,  March 16, 2008, by Dr. Leanord Hawking. There was a question of underlying  osteomyelitis.  The patient was to get an x-ray of his toe.  This has  not been done as of yet.  He was given a prescription for doxycycline  and was also given an offloading sandal.   The patient is diabetic and is under poor control.  His last hemoglobin  A1c reportedly was over 10.  His blood pressure today was 149/99,  however, he says that he did not take his blood pressure medications  this morning.  General medical care has been through [facility].  He says it has been several months since he was there.   Review of blood sugars with the patient today indicates that his  diabetes is under poor control with glucose values generally being  greater than 200 mg percent (none in the area less than 150 mg percent).  He is taking oral hypoglycemics as well as Levemir 20 units at bedtime.   PHYSICAL EXAMINATION:  Temp 97.8, pulse 86, respirations 18, and blood  pressure 149/99.  Left great toe has extensive amount of callous  surrounding deep ulceration and some sluff inside the ulceration.   TREATMENT:  Extensive debridement of callous and sluff was done in the  course of today's visit.   Dressings, Neosporin ointment and gauze to be applied daily at home by  the patient after he wash with soap and water.   The patient was also advised to continue to use offloading sandal daily  and to increase his Levemir to 25 units at bedtime.  If  blood sugars are  not less than 200 mg percent on average next week, then he should  increase to 30 units Levemir at bedtime.   We emphasized diabetic control in the course of trying to get this wound  healed and we advised him that his increased risk of long term adverse  complications of diabetes because of the combined problems of diabetes  and hypertension.  The patient's wife accompanied him on today's visit  and is aware of all the advice that was given.   The patient advised to return in 1-2 weeks for reevaluation of his  wound, possible additional debridement, and hopefully to see some signs  of healing.      Lenon Curt Chilton Si, [name]  Electronically Signed    AGG/MEDQ  D:  03/23/2008  T:  03/24/2008  Job:  [id number]

## 2010-10-22 NOTE — Assessment & Plan Note (Signed)
[facility]   NAMEMarland Kitchen  Curtis, Clark              ACCOUNT NO.:  [id number]   MEDICAL RECORD NO.:  [id number]      DATE OF BIRTH:  1975-06-26   PHYSICIAN:  Lenon Curt. Chilton Si, [name]   VISIT DATE:  05/26/2008                                   OFFICE VISIT   HISTORY:  A 35 year old male who initially injured his left great toe  while playing basketball and returns for reevaluation of the wound  today.  It has been debrided on several visits to the [facility] Care Clinic  and has done well.  There is full healing of the wound present today.  He has no residual pain or discomfort.   PHYSICAL EXAMINATION:  VITAL SIGNS:  Temperature 97.7, pulse 79,  respirations 18, and blood pressure 148/98.  Capillary glucose 200 mg%.  EXTREMITIES:  Wound inspection shows full healing of the left great toe  with no residual problems.   TREATMENT:  The patient is discharged from clinic, will return as  needed.   ICD-9 code 707.15 ulcer of the toe.   CPT code number [phone].      Lenon Curt Chilton Si, [name]  Electronically Signed     AGG/MEDQ  D:  05/26/2008  T:  05/27/2008  Job:  [id number]

## 2010-10-22 NOTE — Assessment & Plan Note (Signed)
[facility]   NAMEMarland Clark  LOVELACE, CERVENY              ACCOUNT NO.:  [id number]   MEDICAL RECORD NO.:  [id number]      DATE OF BIRTH:  04/24/1976   PHYSICIAN:  Lenon Curt. Chilton Si, [name]   VISIT DATE:  05/12/2008                                   OFFICE VISIT   HISTORY:  This 35 year old male with ulceration at the end of his left  great toe returns for reevaluation today.  The patient has no pain.  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  He tries to protect his  feet as much as possible, but again there is much time spent up on his  feet walking around.   PHYSICAL EXAMINATION:  VITAL SIGNS:  Temperature 97.7, pulse 74,  respirations 16, blood pressure 120/86, and glucose 199.  Wound measurements are less than 0.1 x less than 0.1 x a depth of 0.1  cm.  Wound is closing up nicely.  There is no inflammation and no  significant drainage.   TREATMENT:  Sharp debridement with scalpel and forceps was done.  The  patient tolerated the procedure well.  There was no discomfort.  We then  applied Neosporin and gauze to the left great toe.  He would change his  bandage daily.  He is to return in 2 weeks for hopefully a final check  on this wound.  Dimensions were so small today that we considered  discharge; however, he forms callus readily that we want to check on  this wound one more time.   ICD-9 CODE NUMBER:  707.15, ulcer of the toe.   CPT CODE NUMBER:  99213 and [id number] (selective debridement).      Lenon Curt Chilton Si, [name]  Electronically Signed     AGG/MEDQ  D:  05/12/2008  T:  05/12/2008  Job:  [id number]

## 2010-10-22 NOTE — Discharge Summary (Signed)
NAMESAWYER, MENTZER              ACCOUNT NO.:  [id number]   MEDICAL RECORD NO.:  [id number]          PATIENT TYPE:  INP   LOCATION:  5124                         FACILITY:  [facility]   PHYSICIAN:  Hillery Aldo, [name]   DATE OF BIRTH:  [birth year]   DATE OF ADMISSION:  01/17/2009  DATE OF DISCHARGE:  01/21/2009                               DISCHARGE SUMMARY   PRIMARY CARE PHYSICIAN:  Maurice March, MD at [facility].   DISCHARGE DIAGNOSES:  1. Right knee MRSA cellulitis.  2. Subcutaneous abscess anterior to the patellar tendons status post      incision and drainage.  3. Infectious myositis of the tibialis anterior and anterior      compartment of the proximal right leg.  4. Uncontrolled type 2 diabetes.  5. Hypertension.  6. Anemia of chronic disease.   DISCHARGE MEDICATIONS:  1. Bactrim DS 2 tablets p.o. q.12 h x11 days.  2. Oxycodone 5 mg p.o. q.4 h p.r.n. pain.  3. Metformin 500 mg p.o. b.i.d.  4. Glucotrol 10 mg p.o. b.i.d.  5. Lisinopril 10 mg p.o. daily.  6. Levemir 30 units subcutaneously nightly.   CONSULTATIONS:  Mark C. Ophelia Charter, MD of Orthopedic Surgery.   BRIEF ADMISSION HISTORY OF PRESENT ILLNESS:  The patient is a 35-year-  old male with past medical history of recurrent boils.  Apparently, the  patient has had boils involving his axilla, elbow, and developed a  painful boil of the right knee.  The patient was seen in the emergency  department on January 16, 2009, and given a prescription for Bactrim and  Keflex, but returned for evaluation when he became febrile and the  inflammation and pain in his right knee got worse.  For the full  details, please see the dictated report done by Dr. Orvan Falconer.   PROCEDURES AND DIAGNOSTIC STUDIES:  MRI of the right knee showed severe  cellulitis.  Small abscess anterior to the patellar tendon and tibial  tuberosity measuring 3.2 x 2.9 x 1.0 cm.  Infectious myositis of the  tibialis anterior/anterior compartment of the  proximal leg.   DISCHARGE LABORATORY VALUES:  Blood cultures were negative.  White blood  cell count was 11, hemoglobin 10.5, hematocrit 31.6, platelets 310.  Sodium was 134, potassium 3.6, chloride 97, bicarb 29, BUN 6, creatinine  0.77, glucose 113, calcium 8.7.  Hemoglobin A1c was 10.6%.  Thyroid  function studies were normal.  An anemia panel showed a low iron, low  total iron binding capacity, low percent saturation and high ferritin  with normal B12 and folate levels consistent with anemia of chronic  disease.   HOSPITAL COURSE:  1. Cellulitis/right knee abscess/myositis:  The patient was seen and      evaluated and it was felt that the patient had a high probability      for an underlying abscess.  Because of the significant swelling and      pain, an MRI was obtained to rule out underlying fasciitis or      abscess.  MRI did confirm an abscess and he subsequently underwent  incision and drainage by Dr. Ophelia Charter.  It appeared as if the abscess      was consistent with staph aureus.  The patient has completed 3 days      of empiric treatment with vancomycin and Rocephin.  The abscess      cavity is granulating well.  At this point, the patient is stable      for discharge.  We will discharge him on double strength Septra, 2      tablets twice a day for an additional 11 days to complete a total      course of therapy of 2 weeks.  The patient will follow up with Dr.      Ophelia Charter.  The patient was advised that if he should develop any      further abscesses, these will likely need to be drained and that he      should contact his primary care physician.  Wound cultures      confirmed MRSA, septra sensitive.  2. Uncontrolled type 2 diabetes:  The patient's insulin was adjusted      while in the hospital, but we suspect that his lack of glycemic      control was related to his underlying infectious process.  Once his      abscess was drained, his glycemic control improved.  3.  Hypertension:  The patient's blood pressure is well controlled on      his home regimen.  4. Anemia of chronic disease:  The patient did have an anemia panel      drawn with the findings as noted above.  He does not require any      additional supplementation.   DISPOSITION:  The patient is medically stable and is requesting  discharge home.  The patient was instructed by the nursing staff how to  adequately change his dressings.  He was given a supply of iodoform,  sterile Q-tips, and 4 x 4's for dressing changes.  Instructed to shower  daily with an antibacterial soap such as Dial.  Instructed to keep the  abscess covered with 4 x 4's and an Ace wrap until it completely heals.   CONDITION ON DISCHARGE:  Improved.   Time spent coordinating care for discharge and discharge instructions  equals 35 minutes.      Hillery Aldo, [name]  Electronically Signed     CR/MEDQ  D:  01/21/2009  T:  01/21/2009  Job:  [id number]   cc:   Maurice March, [name]

## 2010-10-22 NOTE — Assessment & Plan Note (Signed)
[facility]   NAME:  [name], [name]              ACCOUNT NO.:  [id number]   MEDICAL RECORD NO.:  [id number]      DATE OF BIRTH:  January 09, 1976   PHYSICIAN:  Maxwell Caul, [name] VISIT DATE:  03/16/2008                                   OFFICE VISIT   [name] is a new patient to our clinic I believe referred by the  emergency room.  He is a 35 year old type 2 diabetic who is an active  man who is here for our primary review of wound on his left great toe.   The history is slightly over a month ago he noted pain and draining on  the tip of his left great toe.  He thought he had injured the toe  playing basketball with friction in his shoes.  He was seen in the  emergency room.  I do not see that any cultures or x-rays were done.  He  received Keflex and ciprofloxacin.  He then returned to the emergency  room in followup but he does not have a primary care physician.  They re-  prescribed Augmentin which he completed both courses of antibiotics for  about a week.   The area in general feels better.  He is not experiencing any pain.   PAST MEDICAL HISTORY:  Type 2 diabetes initially on oral medications  from when he was about 20.  He has been seen in this clinic before and  there was a review here from Dr. Tanda Rockers from 2007 at which time he had  a left foot ulcer.   Current medications are reviewed.  He is on Glucotrol 5 mg b.i.d.,  metformin 500 b.i.d., and Levemir insulin 20 units at bedtime.  He is  also on lisinopril 10 mg once a day.   Socially, the patient is active, plays pickup basketball where he thinks  this wound actually happened, he is in security at [employer], spends a lot of  time on his feet and actually needs to work.   REVIEW OF SYSTEMS:  Specifically, negative for chest pain, shortness of  breath or easy fatigability.   On examination limited to his lower extremities, his peripheral pulses  were very brisk and normal on the right, perhaps  less so on the left but  easily palpable.  He does not have any active ischemia.  The left toe  was slightly discolored and also somewhat swollen versus the right.  Nevertheless, there was no pain or warmth.   Wound exam, the area on the tip of his left great toe underwent a  selective debridement of a large amount of callus and adherent tissue to  the wound surface.  After all of this was removed, there is a small open  wound on the tip of the toe.   IMPRESSION:  1. Wagner grade 2 diabetic wound on the left great toe.  This was      selectively debrided as listed above.  I recommended antibacterial      soap wash, Polysporin, Kerlix toe sock all and a healing sandal.  I      am hopeful that he will be able to work in the healing sandal and      he seems to think he  is.  The right great toe has no specific      wound.  I did debride some callus from this.  I simply recommended      Allevyn.  2. Question of underlying osteomyelitis.  I am going to send him for      an x-ray of the toe.  I am really uncertain      whether the swelling and the discoloration is chronic or not.  He      seems to think not although he was less than 100% certain about      this.  I gave him a prescription for doxycycline 100 b.i.d. simply      on the appearance of the toe.  There was certainly nothing that      needed culturing, however.           ______________________________  Maxwell Caul, [name]     MGR/MEDQ  D:  03/16/2008  T:  03/17/2008  Job:  [id number]

## 2010-10-22 NOTE — Assessment & Plan Note (Signed)
[facility]   NAMEMarland Clark  AKRAM, KISSICK              ACCOUNT NO.:  [id number]   MEDICAL RECORD NO.:  [id number]      DATE OF BIRTH:  [birth year]   PHYSICIAN:  Lenon Curt. Chilton Si, [name]   VISIT DATE:  04/28/2008                                   OFFICE VISIT   HISTORY:  A 35 year old male returns for a recheck of the wound of his  right great toe was last seen about 2 weeks ago and the wound again has  been covered with more callus since his last visitation.  He denies any  pain.  Drainage has been minimal.   Incidental finding of a spider bite with a large abscess of the back of  the head was made last visit.  His primary care physician took care of  this.  Wound Care Clinic is not involved.  This area is healing.   EXAMINATION:  Temperature 97.9, pulse 85, respirations 20, blood  pressure 132/86.  Capillary glucose 160 mg percent.  The measurement of  the left great toe was not recorded today.  Extensive debridement was  done with a scalpel as far as callus surrounding this wound.  The wound  overall appears smaller to me than was previously measured.  Following  debridement, it was again covered with Neosporin and gauze.  The patient  is advised to do this daily.  He is to return for physician check of the  wound in 2 weeks.      Lenon Curt Chilton Si, [name]  Electronically Signed     AGG/MEDQ  D:  04/28/2008  T:  04/29/2008  Job:  [id number]

## 2010-10-25 NOTE — Discharge Summary (Signed)
  [name], [name]              ACCOUNT NO.:  [id number]  MEDICAL RECORD NO.:  [id number]           PATIENT TYPE:  I  LOCATION:  5039                         FACILITY:  [facility]  PHYSICIAN:  Rock Nephew, MD       DATE OF BIRTH:  12-May-1976  DATE OF ADMISSION:  10/13/2010 DATE OF DISCHARGE:  10/17/2010                        DISCHARGE SUMMARY - REFERRING   ADDENDUM  The patient had some nausea and vomiting.  The patient will also be in addition Zofran will be added, Zofran 4 mg by mouth every 6 hours as needed for pain to the discharge medications.     Rock Nephew, MD     NH/MEDQ  D:  10/17/2010  T:  10/17/2010  Job:  [id number]  Electronically Signed by Rock Nephew MD on 10/25/2010 04:46:03 PM

## 2010-10-25 NOTE — Discharge Summary (Signed)
[name], [name]              ACCOUNT NO.:  [id number]  MEDICAL RECORD NO.:  [id number]           PATIENT TYPE:  I  LOCATION:  5039                         FACILITY:  [facility]  PHYSICIAN:  Rock Nephew, MD       DATE OF BIRTH:  1976-04-24  DATE OF ADMISSION:  10/14/2010 DATE OF DISCHARGE:  10/17/2010                        DISCHARGE SUMMARY - REFERRING   PRIMARY CARE PHYSICIAN:  Maurice March, MD, at [facility].  DISCHARGE DIAGNOSES: 1. Left second toe cellulitis. 2. Hypertension 3. Type 2 diabetes mellitus. 4. Tobacco abuse.  DISCHARGE MEDICATIONS: 1. Aspirin 81 mg p.o. daily. 2. Bactrim Double Strength 1 tablet p.o. b.i.d. for 7 days. 3. Vicodin 1 tablet p.o. q.6 h. p.r.n. pain. 4. Glipizide 10 mg p.o. daily. 5. Levemir 20 units subcutaneously daily at bedtime. 6. Lisinopril 20 mg p.o. daily. 7. Metformin 500 mg p.o. b.i.d.  DISPOSITION:  The patient is discharged home.  DIET:  Carbohydrate modified.  PROCEDURES PERFORMED:  The patient had 2-view left foot x-ray which showed partial left first toe amputation, left second toe soft tissue swelling with small scattered foci of gas suspicious for soft tissue infection, no acute osseous abnormalities.  CONSULTATIONS ON THIS CASE:  Nadara Mustard, MD, of Orthopedics.  FOLLOWUP:  The patient should follow up with Dr. Jonathon Jordan on Oct 22, 2010.  The patient is to also follow up with Dr. Lajoyce Corners in around Oct 21, 2010.  BRIEF HISTORY OF PRESENT ILLNESS:  Chief complaint:  Left foot diabetic cellulitis and toe necrosis.  A 35 year old male with a history of multiple medical problems including hypertension, diabetes, and unfortunately active tobacco abuse who presents with swelling of the left toe and ankle with pain.  This has progressed over in a few days.  He has no systemic illness.  He denied any fevers, chills, nausea, vomiting, diaphoresis, or other symptomatology.  The patient was noted to have  left toe cellulitis.  HOSPITAL COURSE: 1. Left second toe cellulitis:  The patient was admitted to the     hospital.  The patient was placed on broad-spectrum antibiotics,     vancomycin and Zosyn.  The patient was seen in consultation with     Dr. Lajoyce Corners.  He recommended 5 days of IV antibiotics followed by oral     antibiotics and following up in his office.  The patient did not     have cultures drawn.  The patient will be discharged on oral     Bactrim and follow up with Dr. Lajoyce Corners. 2. Hypertension:  The patient's blood pressure has been controlled.     The patient takes lisinopril at home which is continued. 3. Type 2 diabetes mellitus:  The patient was placed on Lantus and     sliding scale during the hospitalization.  Sugars were reasonably     well controlled.  The patient's hemoglobin A1c was 6.9. 4. Tobacco abuse:  The patient was counseled. 5. DVT prophylaxis:  The patient received heparin for DVT prophylaxis.     Rock Nephew, MD     NH/MEDQ  D:  10/17/2010  T:  10/17/2010  Job:  [id number]  cc:   Maurice March, [name] Acey Lav, MD Nadara Mustard, MD  Electronically Signed by Rock Nephew MD on 10/25/2010 04:45:53 PM

## 2010-10-25 NOTE — Consult Note (Signed)
Curtis, Clark              ACCOUNT NO.:  [id number]   MEDICAL RECORD NO.:  [id number]          PATIENT TYPE:  INP   LOCATION:  5707                         FACILITY:  [facility]   PHYSICIAN:  Theresia Majors. Tanda Rockers, M.D.DATE OF BIRTH:  1975-07-08   DATE OF CONSULTATION:  09/26/2005  DATE OF DISCHARGE:  08/22/2005                                   CONSULTATION   REASON FOR CONSULTATION:  [name] is a 35 year old diabetic referred  by Dr. Morene Antu for evaluation of an ulcerations of the left foot.   IMPRESSION:  Diabetic foot ulcer, Wagner II.   RECOMMENDATION:  The ulcer was debrided full-thickness in the [facility]ound Center,  cultured and off-loaded with a shoe and topical antibiotic and had a  dressing. We will see the patient in three days for follow-up to assess his  response to therapy.   SUBJECTIVE:  [name] is a 35 year old diabetic who has been on oral  hypoglycemic agents for several years. His most recent blood sugar was day  before yesterday and measured to 230 mg percent on a.m. fasting. He does not  have a primary care physician. He was initially seen by Dr. Ferd Glassing on an  urgent basis with the diagnosis of diabetes and foot ulceration made and  referral to the Wound Center ensued.   His past medical history is remarkable for no reported allergies to  medication.  His previous surgery has included a circumcision.   CURRENT MEDICATIONS:  Rifampin 300 mg b.i.d., started on September 21, 2005,  Doxycycline 100 mg b.i.d. started September 21, 2005, Glipizide 5 mg daily and  metformin 500 mg b.i.d.   The patient has never had diabetic dietary instruction. He has a strong  family history of diabetes.   SOCIAL HISTORY:  He is employed at a [employer].   REVIEW OF SYSTEMS:  Specifically negative for chest pain, shortness of  breath, or easy fatigue ability. He admits to the polydipsia and polyphagia.  His weight has been steadily increasing and he feels it is  related to his  increased intake. He does admit to some bilateral thigh pain associated with  some fuzzy vision but denies any transient paralysis. There are no bowel or  bladder symptoms. The remainder of review of systems is negative.   PHYSICAL EXAMINATION:  GENERAL:  He is a well-developed male in no acute distress.  HEENT exam is clear.  NECK:  Supple.  LUNGS:  Clear.  HEART:  Sounds were distant.  ABDOMEN:  Soft.  EXTREMITY:  Exam is remarkable for trace edema. There are bounding 3+  dorsalis pedis pulses bilaterally. On the left foot there is a large  necrotic area at the metatarsal phalangeal joint at the first digit. The  center of the ulcer is oriented more medially but does rotate volarly onto  the foot. This area was sharply debrided with a Baird Lyons in this content which  was cultured and sent to the laboratory. Photographs were taken which  characterize the lesion.  NEUROLOGICAL:  The patient is insensate.   DISCUSSION:  This patient has a diabetic  foot ulcer. We will treat him with  off loading continues antibiotics. We have instructed him of the need for a  primary care physician for the ongoing management of his diabetes. We will  see him in three days for reevaluation of his wound. The patient seems to  understand and he will give the Wound Center a call if there are  difficulties. He has also been instructed to be seen again at the [facility] if he notices progressive fever, bleeding or chills.           ______________________________  Theresia Majors Tanda Rockers, [name]     [name]  D:  09/26/2005  T:  09/26/2005  Job:  [id number]   cc:   Morene Antu, [name]   Dr. Earlene Plater

## 2010-10-27 ENCOUNTER — Inpatient Hospital Stay (HOSPITAL_COMMUNITY)
Admission: EM | Admit: 2010-10-27 | Discharge: 2010-11-01 | DRG: 394 | Disposition: A | Discharge status: Home / Self Care | Payer: Medicaid Other | Attending: Internal Medicine | Admitting: Internal Medicine

## 2010-10-27 ENCOUNTER — Encounter (HOSPITAL_COMMUNITY): Payer: Self-pay | Admitting: Radiology

## 2010-10-27 ENCOUNTER — Emergency Department (HOSPITAL_COMMUNITY): Payer: Medicaid Other

## 2010-10-27 DIAGNOSIS — E538 Deficiency of other specified B group vitamins: Secondary | ICD-10-CM | POA: Diagnosis present

## 2010-10-27 DIAGNOSIS — I10 Essential (primary) hypertension: Secondary | ICD-10-CM | POA: Diagnosis present

## 2010-10-27 DIAGNOSIS — D649 Anemia, unspecified: Secondary | ICD-10-CM | POA: Diagnosis present

## 2010-10-27 DIAGNOSIS — S98119A Complete traumatic amputation of unspecified great toe, initial encounter: Secondary | ICD-10-CM

## 2010-10-27 DIAGNOSIS — E1169 Type 2 diabetes mellitus with other specified complication: Secondary | ICD-10-CM | POA: Diagnosis present

## 2010-10-27 DIAGNOSIS — Z79899 Other long term (current) drug therapy: Secondary | ICD-10-CM

## 2010-10-27 DIAGNOSIS — N179 Acute kidney failure, unspecified: Secondary | ICD-10-CM | POA: Diagnosis present

## 2010-10-27 DIAGNOSIS — Z7982 Long term (current) use of aspirin: Secondary | ICD-10-CM

## 2010-10-27 DIAGNOSIS — M9080 Osteopathy in diseases classified elsewhere, unspecified site: Secondary | ICD-10-CM | POA: Diagnosis present

## 2010-10-27 DIAGNOSIS — M86679 Other chronic osteomyelitis, unspecified ankle and foot: Secondary | ICD-10-CM | POA: Diagnosis present

## 2010-10-27 DIAGNOSIS — I880 Nonspecific mesenteric lymphadenitis: Principal | ICD-10-CM | POA: Diagnosis present

## 2010-10-27 DIAGNOSIS — F17200 Nicotine dependence, unspecified, uncomplicated: Secondary | ICD-10-CM | POA: Diagnosis present

## 2010-10-27 DIAGNOSIS — Z794 Long term (current) use of insulin: Secondary | ICD-10-CM

## 2010-10-27 LAB — CBC
HCT: 30.3 % — ABNORMAL LOW (ref 39.0–52.0)
MCV: 85.1 fL (ref 78.0–100.0)
RDW: 13.1 % (ref 11.5–15.5)
WBC: 9 10*3/uL (ref 4.0–10.5)

## 2010-10-27 LAB — URINE MICROSCOPIC-ADD ON

## 2010-10-27 LAB — URINALYSIS, ROUTINE W REFLEX MICROSCOPIC
Ketones, ur: NEGATIVE mg/dL
Leukocytes, UA: NEGATIVE
Nitrite: NEGATIVE
Protein, ur: 100 mg/dL — AB
pH: 5 (ref 5.0–8.0)

## 2010-10-27 LAB — DIFFERENTIAL
Eosinophils Relative: 1 % (ref 0–5)
Lymphocytes Relative: 18 % (ref 12–46)
Lymphs Abs: 1.7 10*3/uL (ref 0.7–4.0)
Monocytes Absolute: 0.7 10*3/uL (ref 0.1–1.0)

## 2010-10-27 LAB — BASIC METABOLIC PANEL
BUN: 28 mg/dL — ABNORMAL HIGH (ref 6–23)
GFR calc non Af Amer: 28 mL/min — ABNORMAL LOW (ref >60)
Glucose, Bld: 181 mg/dL — ABNORMAL HIGH (ref 70–99)
Potassium: 3.7 mEq/L (ref 3.5–5.1)

## 2010-10-27 MED ORDER — IOHEXOL 300 MG/ML  SOLN
100.0000 mL | Freq: Once | INTRAMUSCULAR | Status: AC | PRN
  Administered 2010-10-27: 100 mL via INTRAVENOUS

## 2010-10-28 DIAGNOSIS — K920 Hematemesis: Secondary | ICD-10-CM

## 2010-10-28 DIAGNOSIS — R112 Nausea with vomiting, unspecified: Secondary | ICD-10-CM

## 2010-10-28 DIAGNOSIS — R1031 Right lower quadrant pain: Secondary | ICD-10-CM

## 2010-10-28 DIAGNOSIS — I880 Nonspecific mesenteric lymphadenitis: Secondary | ICD-10-CM

## 2010-10-28 LAB — BASIC METABOLIC PANEL
Calcium: 8.8 mg/dL (ref 8.4–10.5)
Chloride: 101 mEq/L (ref 96–112)
Creatinine, Ser: 2.24 mg/dL — ABNORMAL HIGH (ref 0.4–1.5)
GFR calc non Af Amer: 34 mL/min — ABNORMAL LOW (ref >60)
Sodium: 136 mEq/L (ref 135–145)

## 2010-10-28 LAB — GLUCOSE, CAPILLARY
Glucose-Capillary: 104 mg/dL — ABNORMAL HIGH (ref 70–99)
Glucose-Capillary: 101 mg/dL — ABNORMAL HIGH (ref 70–99)

## 2010-10-29 ENCOUNTER — Inpatient Hospital Stay (HOSPITAL_COMMUNITY): Payer: Medicaid Other

## 2010-10-29 DIAGNOSIS — R112 Nausea with vomiting, unspecified: Secondary | ICD-10-CM

## 2010-10-29 DIAGNOSIS — R1031 Right lower quadrant pain: Secondary | ICD-10-CM

## 2010-10-29 DIAGNOSIS — I880 Nonspecific mesenteric lymphadenitis: Secondary | ICD-10-CM

## 2010-10-29 LAB — MRSA PCR SCREENING: MRSA by PCR: NEGATIVE

## 2010-10-29 LAB — CBC
Platelets: 372 10*3/uL (ref 150–400)
RBC: 3.1 MIL/uL — ABNORMAL LOW (ref 4.22–5.81)
RDW: 13.4 % (ref 11.5–15.5)
WBC: 7.6 10*3/uL (ref 4.0–10.5)

## 2010-10-29 LAB — HEMOGLOBIN AND HEMATOCRIT, BLOOD: Hemoglobin: 9 g/dL — ABNORMAL LOW (ref 13.0–17.0)

## 2010-10-29 LAB — BASIC METABOLIC PANEL
Chloride: 105 mEq/L (ref 96–112)
GFR calc Af Amer: 47 mL/min — ABNORMAL LOW (ref >60)
GFR calc non Af Amer: 39 mL/min — ABNORMAL LOW (ref >60)
Potassium: 3.8 mEq/L (ref 3.5–5.1)
Sodium: 140 mEq/L (ref 135–145)

## 2010-10-29 LAB — GLUCOSE, CAPILLARY
Glucose-Capillary: 102 mg/dL — ABNORMAL HIGH (ref 70–99)
Glucose-Capillary: 110 mg/dL — ABNORMAL HIGH (ref 70–99)
Glucose-Capillary: 111 mg/dL — ABNORMAL HIGH (ref 70–99)
Glucose-Capillary: 115 mg/dL — ABNORMAL HIGH (ref 70–99)
Glucose-Capillary: 116 mg/dL — ABNORMAL HIGH (ref 70–99)
Glucose-Capillary: 98 mg/dL (ref 70–99)

## 2010-10-29 LAB — HEMOGLOBIN A1C: Hgb A1c MFr Bld: 6.9 % — ABNORMAL HIGH (ref <5.7)

## 2010-10-30 ENCOUNTER — Inpatient Hospital Stay (HOSPITAL_COMMUNITY): Payer: Medicaid Other

## 2010-10-30 DIAGNOSIS — R1031 Right lower quadrant pain: Secondary | ICD-10-CM

## 2010-10-30 DIAGNOSIS — I880 Nonspecific mesenteric lymphadenitis: Secondary | ICD-10-CM

## 2010-10-30 DIAGNOSIS — R112 Nausea with vomiting, unspecified: Secondary | ICD-10-CM

## 2010-10-30 LAB — BASIC METABOLIC PANEL
CO2: 27 mEq/L (ref 19–32)
Calcium: 8.9 mg/dL (ref 8.4–10.5)
Creatinine, Ser: 1.91 mg/dL — ABNORMAL HIGH (ref 0.4–1.5)
GFR calc Af Amer: 49 mL/min — ABNORMAL LOW (ref >60)
GFR calc non Af Amer: 41 mL/min — ABNORMAL LOW (ref >60)
Sodium: 139 mEq/L (ref 135–145)

## 2010-10-30 LAB — CBC
HCT: 25.9 % — ABNORMAL LOW (ref 39.0–52.0)
Hemoglobin: 8.8 g/dL — ABNORMAL LOW (ref 13.0–17.0)
MCHC: 34 g/dL (ref 30.0–36.0)
RDW: 13.5 % (ref 11.5–15.5)
WBC: 8.1 10*3/uL (ref 4.0–10.5)

## 2010-10-30 LAB — GLUCOSE, CAPILLARY
Glucose-Capillary: 90 mg/dL (ref 70–99)
Glucose-Capillary: 98 mg/dL (ref 70–99)
Glucose-Capillary: 101 mg/dL — ABNORMAL HIGH (ref 70–99)
Glucose-Capillary: 92 mg/dL (ref 70–99)
Glucose-Capillary: 88 mg/dL (ref 70–99)

## 2010-10-30 LAB — LIPASE, BLOOD: Lipase: 23 U/L (ref 11–59)

## 2010-10-30 LAB — URINE CULTURE: Colony Count: NO GROWTH

## 2010-10-31 ENCOUNTER — Other Ambulatory Visit (HOSPITAL_COMMUNITY): Payer: Self-pay

## 2010-10-31 DIAGNOSIS — R112 Nausea with vomiting, unspecified: Secondary | ICD-10-CM

## 2010-10-31 DIAGNOSIS — I880 Nonspecific mesenteric lymphadenitis: Secondary | ICD-10-CM

## 2010-10-31 LAB — GLUCOSE, CAPILLARY
Glucose-Capillary: 121 mg/dL — ABNORMAL HIGH (ref 70–99)
Glucose-Capillary: 89 mg/dL (ref 70–99)
Glucose-Capillary: 92 mg/dL (ref 70–99)
Glucose-Capillary: 95 mg/dL (ref 70–99)

## 2010-10-31 LAB — COMPREHENSIVE METABOLIC PANEL
AST: 16 U/L (ref 0–37)
Albumin: 2.7 g/dL — ABNORMAL LOW (ref 3.5–5.2)
Alkaline Phosphatase: 65 U/L (ref 39–117)
BUN: 13 mg/dL (ref 6–23)
CO2: 24 mEq/L (ref 19–32)
Chloride: 104 mEq/L (ref 96–112)
GFR calc Af Amer: 52 mL/min — ABNORMAL LOW (ref >60)
GFR calc non Af Amer: 43 mL/min — ABNORMAL LOW (ref >60)
Potassium: 3.5 mEq/L (ref 3.5–5.1)
Total Bilirubin: 0.4 mg/dL (ref 0.3–1.2)

## 2010-10-31 LAB — RPR: RPR Ser Ql: NONREACTIVE

## 2010-10-31 LAB — VITAMIN B12: Vitamin B-12: 254 pg/mL (ref 211–911)

## 2010-10-31 LAB — FERRITIN: Ferritin: 244 ng/mL (ref 22–322)

## 2010-11-01 ENCOUNTER — Other Ambulatory Visit (HOSPITAL_COMMUNITY): Payer: Self-pay

## 2010-11-01 LAB — BASIC METABOLIC PANEL
GFR calc non Af Amer: 45 mL/min — ABNORMAL LOW (ref >60)
Glucose, Bld: 139 mg/dL — ABNORMAL HIGH (ref 70–99)
Potassium: 3.7 mEq/L (ref 3.5–5.1)
Sodium: 139 mEq/L (ref 135–145)

## 2010-11-01 LAB — GLUCOSE, CAPILLARY: Glucose-Capillary: 121 mg/dL — ABNORMAL HIGH (ref 70–99)

## 2010-11-03 NOTE — Discharge Summary (Signed)
[name], [name]              ACCOUNT NO.:  [id number]  MEDICAL RECORD NO.:  [id number]           PATIENT TYPE:  I  LOCATION:  5523                         FACILITY:  [facility]  PHYSICIAN:  Marinda Elk, M.D.DATE OF BIRTH:  Feb 03, 1976  DATE OF ADMISSION:  10/27/2010 DATE OF DISCHARGE:  11/01/2010                              DISCHARGE SUMMARY   PRIMARY CARE DOCTOR:  [facility].  CONSULTING DOCTOR:  Iva Boop, MD,FACG and Eber Hong, PA  DISCHARGE DIAGNOSES: 1. Mesenteric adenitis. 2. Low B12. 3. Chronic osteomyelitis to be followed up by his orthopedic doctor as     an outpatient. 4. Hypertension. 5. Acute kidney injury.  DISCHARGE MEDICATIONS: 1. Cipro 500 mg p.o. b.i.d. 2. Flagyl 500 mg t.i.d. 3. Norvasc 10 mg daily. 4. Aspirin 81 mg daily. 5. Glipizide 10 mg daily. 6. Levemir 20 units at bedtime. 7. Zocor 20 mg daily. 8. Zofran 4 mg every 6 hours p.r.n. 9. Vicodin one subcu every 4-6 hours p.r.n.  PROCEDURES PERFORMED: 1. Oct 30, 2010, chest x-ray showed no acute cardiopulmonary disease. 2. Abdominal on Oct 30, 2010, showed negative abdominal abdomen. 3. Renal ultrasound show unremarkable renal ultrasound. 4. CT abdomen and pelvis on Oct 27, 2010, showed a single large 3.5 x     2.1 mesenteric node noted in the anterior distal aorta with     surrounding soft tissue stranding this may represent focal     infectious and inflammatory process, incomplete ascending rotation     of the right kidney with mild associated perinephric stranding. 5. Small foci of increased attenuation of the distal esophagus may     represent normal intraluminal contour and possible for small polyp.  BRIEF ADMITTING H AND P:  This is a 35 year old African American male with type 2 diabetes, well-controlled comes in with a history of cellulitis on the left middle toe treated with antibiotic discharged on Oct 17, 2010, as per him he is continued to have nausea and  vomiting since discharge his condition has worsened about a week with progressive nausea and vomiting and abdominal pain in the right lower quadrant. Denies any fever or chills.  Has not eaten anything.  His pain is 7/10. He cannot keep anything down.  As per him, yesterday, he noticed some blood in his vomitus, which was described as a small amount, however, his vomitus was basically the color of what he eats.  He came to the ED, was admitted to the CTU for observation with IV Zofran and symptoms did not improve, so we were asked to admit and further evaluate.  Please refer to dictation on Oct 28, 2010, for further details.  BRIEF HOSPITAL COURSE: 1. Mesenteric adenitis.  He was started on Cipro and Flagyl.  His pain     took about 3 days to improve.  After this, he was tolerating his     diet.  On admission, he was started on Cipro and Flagyl, but then     changed to vancomycin and Zosyn.  When his pain was improved and     when he is tolerating his diet, he was changed  to Cipro and Flagyl     which he tolerated well.  He will continue this for a total 14     days.  He will follow up with his primary care doctor as an     outpatient. 2. Chronic osteomyelitis of his right toe.  Ortho was consulted.  They     say that they will follow him as an outpatient.  Until 4 weeks     after this abdominal condition has been resolved. 3. Low B12.  An MMA is pending.  At this time, he was given 1000 mcg     vitamin B12.  He will follow up with his primary care doctor  who     will check a B12 at that time. 4. Hypertension.  His blood pressure was high, initially he has had     acute kidney injury, so his blood pressure medications were     stopped, especially his lisinopril.  He was started on Norvasc.     Now, he was well-controlled.  His creatinine is coming down, so he     was started on lisinopril the next week, and go back on his     metformin as needed. 5. Acute kidney injury.  This  resolved with IV fluids.  His creatinine     continues to trend down.  On the day of discharge, his creatinine     is 1.7, his baseline creatinine was 1.4.  His baseline is 1.  So he     will continue to hold his ACE and metformin and we will start his     Ace and metformin next week. 6. Diabetes type 2, currently well-controlled, no changes were made.     But his metformin was held on admission.  I am going to continued     to be on hold until next week.  When he sees his primary care     doctor, at that time, we will start his metformin.  Discharge vitals, temperature 98, pulse 91, respirations 18, blood pressure 170/106.  He is saturating 99% on room air.     Marinda Elk, [name]     AF/MEDQ  D:  11/01/2010  T:  11/02/2010  Job:  [id number]  cc:   Dala Dock  Electronically Signed by Marinda Elk [name] on 11/03/2010 02:19:00 PM

## 2010-11-06 LAB — CULTURE, BLOOD (ROUTINE X 2)
Culture  Setup Time: 201205240045
Culture: NO GROWTH
Culture: NO GROWTH

## 2010-11-15 ENCOUNTER — Ambulatory Visit (HOSPITAL_COMMUNITY)
Admission: RE | Admit: 2010-11-15 | Discharge: 2010-11-15 | Disposition: A | Discharge status: Home / Self Care | Payer: Medicaid Other | Source: Ambulatory Visit | Attending: Orthopedic Surgery | Admitting: Orthopedic Surgery

## 2010-11-15 ENCOUNTER — Other Ambulatory Visit: Payer: Self-pay | Admitting: Orthopedic Surgery

## 2010-11-15 DIAGNOSIS — L97509 Non-pressure chronic ulcer of other part of unspecified foot with unspecified severity: Secondary | ICD-10-CM | POA: Insufficient documentation

## 2010-11-15 DIAGNOSIS — I10 Essential (primary) hypertension: Secondary | ICD-10-CM | POA: Insufficient documentation

## 2010-11-15 DIAGNOSIS — M869 Osteomyelitis, unspecified: Secondary | ICD-10-CM | POA: Insufficient documentation

## 2010-11-15 DIAGNOSIS — E1169 Type 2 diabetes mellitus with other specified complication: Secondary | ICD-10-CM | POA: Insufficient documentation

## 2010-11-15 DIAGNOSIS — M9080 Osteopathy in diseases classified elsewhere, unspecified site: Secondary | ICD-10-CM | POA: Insufficient documentation

## 2010-11-15 DIAGNOSIS — Z01812 Encounter for preprocedural laboratory examination: Secondary | ICD-10-CM | POA: Insufficient documentation

## 2010-11-15 LAB — APTT: aPTT: 38 seconds — ABNORMAL HIGH (ref 24–37)

## 2010-11-15 LAB — COMPREHENSIVE METABOLIC PANEL
ALT: 13 U/L (ref 0–53)
AST: 14 U/L (ref 0–37)
CO2: 28 mEq/L (ref 19–32)
Calcium: 9.1 mg/dL (ref 8.4–10.5)
Sodium: 136 mEq/L (ref 135–145)
Total Protein: 7.8 g/dL (ref 6.0–8.3)

## 2010-11-15 LAB — CBC
MCV: 86.7 fL (ref 78.0–100.0)
Platelets: 299 10*3/uL (ref 150–400)
RBC: 3.69 MIL/uL — ABNORMAL LOW (ref 4.22–5.81)
WBC: 7.5 10*3/uL (ref 4.0–10.5)

## 2010-11-15 LAB — GLUCOSE, CAPILLARY: Glucose-Capillary: 119 mg/dL — ABNORMAL HIGH (ref 70–99)

## 2010-11-15 LAB — SURGICAL PCR SCREEN: Staphylococcus aureus: NEGATIVE

## 2010-11-15 LAB — PROTIME-INR: Prothrombin Time: 14.9 seconds (ref 11.6–15.2)

## 2010-11-18 LAB — GLUCOSE, CAPILLARY: Glucose-Capillary: 177 mg/dL — ABNORMAL HIGH (ref 70–99)

## 2010-11-20 NOTE — Op Note (Signed)
  NAMEEDON, HOADLEY              ACCOUNT NO.:  [id number]  MEDICAL RECORD NO.:  [id number]  LOCATION:  SDSC                         FACILITY:  [facility]  PHYSICIAN:  Nadara Mustard, MD     DATE OF BIRTH:  Jan 14, 1976  DATE OF PROCEDURE:  11/15/2010 DATE OF DISCHARGE:  11/15/2010                              OPERATIVE REPORT   PREOPERATIVE DIAGNOSIS:  Osteomyelitis, abscess, and ulceration, left foot second toe.  POSTOPERATIVE DIAGNOSIS:  Osteomyelitis, abscess, and ulceration, left foot second toe.  PROCEDURE: 1. Left foot second toe amputation at the metatarsophalangeal joint. 2. Application of ACell powder and ACell xenograft tissue graft to the     amputation site.  SURGEON:  Nadara Mustard, MD  ANESTHESIA:  Ankle block.  ESTIMATED BLOOD LOSS:  Minimal.  ANTIBIOTICS:  One gram of Kefzol.  DRAINS:  None.  COMPLICATIONS:  None.  TOURNIQUET TIME:  None.  DISPOSITION:  To PACU in stable condition.  INDICATIONS FOR PROCEDURE:  The patient is a 35 year old gentleman with type 2 diabetes status post great toe amputation who has had progressive sausage digit, swelling, ulceration, foul odor, and exposed bone on the second toe.  Due to the clinical abscess and osteomyelitis ulceration with exposed bone, and failure of conservative treatment with antibiotics, The patient presents at this time for surgical intervention.  Risks and benefits were discussed including persistent infection, nonhealing of the wound, need for higher-level amputation. The patient states he understands and wished to proceed at this time.  DESCRIPTION OF PROCEDURE:  The patient underwent an ankle block and then was brought to OR 10.  After adequate level of anesthesia was obtained, the patient's left lower extremity was prepped using DuraPrep and draped into a sterile field.  A fishmouth incision was made through the mid shaft of the proximal phalanx of the left foot second toe.  The toe  was amputated through the MTP joint.  The wound was irrigated with normal saline.  Electrocautery was used for hemostasis.  The void within the soft tissue was filled with the ACell powder.  The incision was closed using 2-0 nylon.  The wound was then covered with the ACell xenograft tissue graft.  This was covered Adaptic, orthopedic sponges, ABD dressing, Kerlix, and Coban.  The patient was then taken to PACU in stable condition.  Plan for discharge to home.  Prescription for Percocet for pain.  Follow up in the office in 2 weeks.     Nadara Mustard, MD     MVD/MEDQ  D:  11/15/2010  T:  11/16/2010  Job:  [id number]  Electronically Signed by Aldean Baker MD on 11/20/2010 06:56:10 AM

## 2010-11-22 NOTE — Consult Note (Signed)
[name], [name]              ACCOUNT NO.:  [id number]  MEDICAL RECORD NO.:  [id number]  LOCATION:                                 FACILITY:  PHYSICIAN:  Gabrielle Dare. Janee Morn, M.D.DATE OF BIRTH:  [birth year]  DATE OF CONSULTATION:  10/30/2010 DATE OF DISCHARGE:                                CONSULTATION   REFERRING PHYSICIAN: 1. Iva Boop, MD, FACG 2. Marinda Elk, MD  CHIEF COMPLAINT:  Right lower quadrant pain/adenitis.  BRIEF HISTORY:  The patient is a 35 year old African American male who was recently in hospital from Oct 14, 2010, through Oct 17, 2010, with a diagnosis of left second toe cellulitis.  He was treated during that hospitalization for cellulitis and distal toe necrosis with IV and then oral antibiotics.  He was subsequently discharged home on Oct 17, 2010, at which time he was having some nausea and vomiting and was treated with Zofran.  The patient reports ongoing nausea and vomiting since he was discharged and was readmitted for nausea and vomiting on Oct 27, 2010.  Initially, the exam was reported negative and then there was some right lower quadrant discomfort.  He underwent a CT scan on Oct 27, 2010, which showed some mild focal inflammatory changes.  There was also incomplete ascend and rotation of the right kidney with mild perinephric stranding.  There was also some attenuation at the distal esophagus. The patient has been in the hospital and on antibiotics with ongoing nausea and vomiting since readmission on Oct 27, 2010.  He has been seen by GI and we are asked see in consultation also.  PAST MEDICAL HISTORY: 1. He is an insulin-dependent diabetic. 2. Tobacco use. 3. Hypertension. 4. Left toe cellulitis.  PAST SURGICAL HISTORY:  He had distal great toe amputation on July 22, 2010.  FAMILY HISTORY:  Both parents are living in good health.  SOCIAL HISTORY:  The patient denies tobacco, alcohol, or drug use.  He lives with his  fiancee.  He works as a Financial risk analyst at [employer], currently unemployed.  REVIEW OF SYSTEMS:  SKIN:  The only changes are noted in his left second toe.  Fever none.  Weight loss none he is aware of.  CV:  Negative for headache, dizziness, syncope, stroke, or seizure.  PULMONARY:  No orthopnea, PND, or DOE.  He is not coughing or wheezing.  GI:  Positive for nausea, vomiting, abdominal discomfort, usually most recently in the right lower quadrant.  He also reports he has not had a recent bowel movement.  GU:  No trouble voiding.  MUSCULOSKELETAL:  No changes. ENDOCRINE:  Diabetes.  No other changes.  PSYCHIATRIC:  No new changes.  HOME MEDICATIONS: 1. Levemir insulin 20 units h.s. 2. Lisinopril 20 mg daily. 3. Metformin 500 mg b.i.d.  Current medications include heparin, Reglan, Protonix.  Says he is on the vanc and Zosyn protocol, labetalol, Lopressor, Zofran, and Phenergan.  ALLERGIES:  None known.  PHYSICAL EXAMINATION:  GENERAL:  The patient is a 35 year old African American male who was asleep only went we went in.  Alert, oriented, cooperative.  At that time, he was having no discomfort and no nausea. Apparently, he had  been recently medicated. VITAL SIGNS:  Temperature 98.5, heart rate 88, blood pressure 157/53, respiratory rate is 17, sats are 95% on room air last exam. HEAD, EAR, NOSE, AND THROAT:  Within normal limits. NECK:  Trachea is in the midline.  Thyroid is nonpalpable.  No JVD.  No bruits. RESPIRATORY:  Effort is normal. CHEST:  Clear to auscultation.  No wheezing, rhonchi, or rales. CARDIAC:  Normal S1 and S2.  No murmur or rub was heard.  Pulses are +2 and equal in both upper and lower extremities.  Chest is nontender. ABDOMEN:  Bowel sounds are present.  He is not distended.  Currently, he is not tender.  No hernia, masses, or abscesses noted. RECTAL AND GENITALIA:  Deferred. LYMPHADENOPATHY:  None palpated. SKIN:  Changes of left second toe.  Great distal toe has  been amputated. NEUROLOGIC:  No focal changes PSYCHIATRIC:  No acute changes.  Bit of a flat affect.  The patient was up vomiting when I went back to ask another question after the initial exam.  LABORATORY DATA:  Urine culture on admission showed no growth.  White count is 8.1, hemoglobin 8.8, hematocrit 25, platelets 361,000. Hemoglobin A1c is 6.9.  Sodium is 139, potassium is 3.9, chloride is 106, CO2 is 27, BUN is 13, creatinine is 1.91.  I do not see liver function studies or lipase or amylase.  Hemoglobin A1c is 6.9.  DIAGNOSTICS:  Renal ultrasound is essentially normal on Oct 29, 2010. CT of the abdomen as noted above.  IMPRESSION: 1. Right lower quadrant abdominal pain, nausea, vomiting. 2. Adenitis on CT. 3. Insulin-dependent diabetes. 4. Acute renal insufficiency, creatinine on Oct 15, 2010, was 0.99, it     was 2.65 on admission on Oct 27, 2010, and is currently down to     1.91. 5. Anemia.  Hemoglobin was 10.7 and hematocrit was 31.7 on Oct 15, 2010.  Again, today it is down to 8.8 and 25.9 respectively. 6. Questionable osteomyelitis. 7. Body mass index of 30.  PLAN:  The patient was seen and evaluated by Dr. Janee Morn.  He notes he has mesenteric adenitis with no significant abdominal tenderness.  No need for surgery at this time.  He should improve with IV fluids and antiemetics.  At this point, there is no reason for surgical intervention.  Please call us back if we can be of any further assistance.     Eber Hong, P.A.   ______________________________ Gabrielle Dare. Janee Morn, [name]    WDJ/MEDQ  D:  10/30/2010  T:  10/31/2010  Job:  [id number]  cc:   Iva Boop, MD,FACG  Electronically Signed by Sherrie George P.A. on 11/13/2010 03:27:30 PM Electronically Signed by Violeta Gelinas [name] on 11/22/2010 06:32:12 PM

## 2010-12-22 ENCOUNTER — Emergency Department (HOSPITAL_COMMUNITY): Payer: Medicaid Other

## 2010-12-22 ENCOUNTER — Inpatient Hospital Stay (HOSPITAL_COMMUNITY)
Admission: EM | Admit: 2010-12-22 | Discharge: 2010-12-25 | DRG: 418 | Disposition: A | Discharge status: Home / Self Care | Payer: Medicaid Other | Attending: Internal Medicine | Admitting: Internal Medicine

## 2010-12-22 DIAGNOSIS — K8000 Calculus of gallbladder with acute cholecystitis without obstruction: Principal | ICD-10-CM | POA: Diagnosis present

## 2010-12-22 DIAGNOSIS — Z794 Long term (current) use of insulin: Secondary | ICD-10-CM

## 2010-12-22 DIAGNOSIS — I10 Essential (primary) hypertension: Secondary | ICD-10-CM | POA: Diagnosis present

## 2010-12-22 DIAGNOSIS — F17200 Nicotine dependence, unspecified, uncomplicated: Secondary | ICD-10-CM | POA: Diagnosis present

## 2010-12-22 DIAGNOSIS — E109 Type 1 diabetes mellitus without complications: Secondary | ICD-10-CM | POA: Diagnosis present

## 2010-12-22 DIAGNOSIS — M8660 Other chronic osteomyelitis, unspecified site: Secondary | ICD-10-CM | POA: Diagnosis present

## 2010-12-22 LAB — BLOOD GAS, VENOUS
Acid-Base Excess: 3.6 mmol/L — ABNORMAL HIGH (ref 0.0–2.0)
Bicarbonate: 27.3 mEq/L — ABNORMAL HIGH (ref 20.0–24.0)
O2 Saturation: 52 %
Patient temperature: 98.6
TCO2: 25 mmol/L (ref 0–100)
pCO2, Ven: 39.7 mmHg — ABNORMAL LOW (ref 45.0–50.0)
pH, Ven: 7.453 — ABNORMAL HIGH (ref 7.250–7.300)
pO2, Ven: 28.2 mmHg — CL (ref 30.0–45.0)

## 2010-12-22 LAB — DIFFERENTIAL
Basophils Absolute: 0 10*3/uL (ref 0.0–0.1)
Basophils Relative: 0 % (ref 0–1)
Eosinophils Absolute: 0 10*3/uL (ref 0.0–0.7)
Eosinophils Relative: 0 % (ref 0–5)
Lymphocytes Relative: 8 % — ABNORMAL LOW (ref 12–46)
Lymphs Abs: 1 10*3/uL (ref 0.7–4.0)
Monocytes Absolute: 1 10*3/uL (ref 0.1–1.0)
Monocytes Relative: 8 % (ref 3–12)
Neutro Abs: 10.8 10*3/uL — ABNORMAL HIGH (ref 1.7–7.7)
Neutrophils Relative %: 84 % — ABNORMAL HIGH (ref 43–77)

## 2010-12-22 LAB — COMPREHENSIVE METABOLIC PANEL
BUN: 19 mg/dL (ref 6–23)
Calcium: 9.7 mg/dL (ref 8.4–10.5)
Creatinine, Ser: 1.33 mg/dL (ref 0.50–1.35)
GFR calc Af Amer: >60 mL/min (ref >60)
Glucose, Bld: 140 mg/dL — ABNORMAL HIGH (ref 70–99)
Total Protein: 8.5 g/dL — ABNORMAL HIGH (ref 6.0–8.3)

## 2010-12-22 LAB — CBC
HCT: 32.4 % — ABNORMAL LOW (ref 39.0–52.0)
Hemoglobin: 11 g/dL — ABNORMAL LOW (ref 13.0–17.0)
MCH: 29.6 pg (ref 26.0–34.0)
MCHC: 34 g/dL (ref 30.0–36.0)
MCV: 87.3 fL (ref 78.0–100.0)
Platelets: 242 10*3/uL (ref 150–400)
RBC: 3.71 MIL/uL — ABNORMAL LOW (ref 4.22–5.81)
RDW: 13.4 % (ref 11.5–15.5)
WBC: 12.8 10*3/uL — ABNORMAL HIGH (ref 4.0–10.5)

## 2010-12-22 LAB — URINALYSIS, ROUTINE W REFLEX MICROSCOPIC
Nitrite: NEGATIVE
Protein, ur: 30 mg/dL — AB
Specific Gravity, Urine: 1.018 (ref 1.005–1.030)
Urobilinogen, UA: 1 mg/dL (ref 0.0–1.0)

## 2010-12-22 LAB — LIPASE, BLOOD: Lipase: 17 U/L (ref 11–59)

## 2010-12-22 LAB — URINE MICROSCOPIC-ADD ON

## 2010-12-22 MED ORDER — IOHEXOL 300 MG/ML  SOLN
100.0000 mL | Freq: Once | INTRAMUSCULAR | Status: AC | PRN
  Administered 2010-12-22: 100 mL via INTRAVENOUS

## 2010-12-23 ENCOUNTER — Inpatient Hospital Stay (HOSPITAL_COMMUNITY): Payer: Medicaid Other

## 2010-12-23 ENCOUNTER — Other Ambulatory Visit (INDEPENDENT_AMBULATORY_CARE_PROVIDER_SITE_OTHER): Payer: Self-pay | Admitting: General Surgery

## 2010-12-23 DIAGNOSIS — K8000 Calculus of gallbladder with acute cholecystitis without obstruction: Secondary | ICD-10-CM

## 2010-12-23 LAB — CBC
HCT: 31.5 % — ABNORMAL LOW (ref 39.0–52.0)
Hemoglobin: 10.4 g/dL — ABNORMAL LOW (ref 13.0–17.0)
MCH: 29.1 pg (ref 26.0–34.0)
MCHC: 33 g/dL (ref 30.0–36.0)
MCV: 88 fL (ref 78.0–100.0)
Platelets: 244 10*3/uL (ref 150–400)
RBC: 3.58 MIL/uL — ABNORMAL LOW (ref 4.22–5.81)
RDW: 13.8 % (ref 11.5–15.5)
WBC: 14.8 10*3/uL — ABNORMAL HIGH (ref 4.0–10.5)

## 2010-12-23 LAB — SURGICAL PCR SCREEN
MRSA, PCR: NEGATIVE
Staphylococcus aureus: NEGATIVE

## 2010-12-23 LAB — BASIC METABOLIC PANEL
BUN: 14 mg/dL (ref 6–23)
CO2: 28 mEq/L (ref 19–32)
Calcium: 9.4 mg/dL (ref 8.4–10.5)
Chloride: 104 mEq/L (ref 96–112)
Creatinine, Ser: 1.26 mg/dL (ref 0.50–1.35)
GFR calc Af Amer: >60 mL/min (ref >60)
GFR calc non Af Amer: >60 mL/min (ref >60)
Glucose, Bld: 135 mg/dL — ABNORMAL HIGH (ref 70–99)
Potassium: 4.2 mEq/L (ref 3.5–5.1)
Sodium: 140 mEq/L (ref 135–145)

## 2010-12-23 LAB — GLUCOSE, CAPILLARY
Glucose-Capillary: 145 mg/dL — ABNORMAL HIGH (ref 70–99)
Glucose-Capillary: 151 mg/dL — ABNORMAL HIGH (ref 70–99)
Glucose-Capillary: 67 mg/dL — ABNORMAL LOW (ref 70–99)

## 2010-12-23 LAB — HEMOGLOBIN A1C
Hgb A1c MFr Bld: 6.2 % — ABNORMAL HIGH (ref <5.7)
Mean Plasma Glucose: 131 mg/dL — ABNORMAL HIGH (ref <117)

## 2010-12-23 NOTE — Consult Note (Signed)
NAMEEUCLID, CASSETTA              ACCOUNT NO.:  [id number]  MEDICAL RECORD NO.:  [id number]  LOCATION:  1444                         FACILITY:  [facility]  PHYSICIAN:  Sharlet Salina T. Raeleen Winstanley, M.D.DATE OF BIRTH:  [birth year]  DATE OF CONSULTATION:  12/23/2010 DATE OF DISCHARGE:                                CONSULTATION   CHIEF COMPLAINT:  Right upper quadrant abdominal pain.  HISTORY:  I was asked by the [facility] service to evaluate Mr. Lynnea Ferrier.  He was admitted last night from the emergency room.  He states that yesterday morning at about 24 hours ago he developed the fairly sudden onset of severe right upper quadrant abdominal pain.  He describes aching pain underneath his right ribcage without radiation. The pain is quite severe.  It has been constant since onset and only partially relieved when he gets pain medication and then it recurs. Soon after onset, he had nausea and vomiting with no hematemesis.  Bowel movements have been normal.  He denies fever or chills.  He denies any previous history of similar pain or any previous significant GI or abdominal complaints.  He was admitted by the [facility] last night and has been placed on Zosyn for possible cholecystitis.  PAST MEDICAL HISTORY:  Significant for insulin-dependent diabetes mellitus, hypertension.  He has had a previous episode of acute renal injury.  PAST SURGICAL HISTORY:  He has had 2 toes amputated from his left foot about a month ago.  MEDICATIONS ON ADMISSION: 1. Levemir 20 units at bedtime. 2. Lisinopril 20 daily. 3. Metformin 500 b.i.d. 4. Norvasc 10 daily. 5. Aspirin 81 daily. 6. Glipizide 10 daily. 7. Zofran p.r.n. 8. Vicodin p.r.n.[name]  CURRENT MEDICATIONS:  Lovenox 50 mg subcu last night, Zosyn IV.  ALLERGIES:  No known drug allergies.  SOCIAL HISTORY:  He is currently unemployed.  Healthcare is at [employer].  He is married.  He smokes cigarettes.  Does not drink alcohol.  FAMILY  HISTORY:  Significant for diabetes and hypertension.  REVIEW OF SYSTEMS:  GENERAL:  Denies fever, chills, weight change. HEENT:  Denies vision, hearing, swallowing problems.  RESPIRATORY:  No cough, shortness of breath, wheezing.  CARDIAC:  No chest pain, palpitations, history of heart disease.  ABDOMEN:  GI as above.  GU:  No urinary burning frequency.  PHYSICAL EXAMINATION:  GENERAL:  Normal weight African-American male who appears in significant pain. SKIN:  Warm and dry without rash or infection. HEENT:  No palpable mass or thyromegaly.  Sclerae nonicteric. Oropharynx clear. LYMPH NODES:  No cervical, supraclavicular or inguinal nodes palpable. LUNGS:  Clear without wheezing or increased work of breathing. CARDIAC:  Regular.  Mild tachycardia.  No murmurs.  No JVD.  Peripheral pulses intact. ABDOMEN:  Nondistended.  Bowel sounds hypoactive.  There is well- localized very significant right upper quadrant tenderness with guarding.  No discernible masses or organomegaly. EXTREMITIES:  No joint swelling or deformity.  He has healed amputations of the great and second toe on the left.  NEUROLOGIC:  Alert, fully oriented.  Motor and sensory exams grossly normal.  LABORATORY DATA:  Urine microscopic unremarkable.  LFTs, electrolytes, renal function all within normal limits.  White blood count  on admission was 12.8, today is 14.8; hemoglobin is 10.4; lipase is 17.  IMAGING:  CT angio of the chest last night from the emergency room was unremarkable with no evidence of pulmonary embolus or other pathology. Ultrasound was performed which I reviewed today with the radiologist. There is abnormal thickening of the gallbladder wall consistent with adenosis or possible inflammation.  There is sludge or possibly small stones in the neck of the gallbladder.  The common bile duct is normal in size at 5 mm, and there is no evidence of common bile duct stones seen despite the original printed  report which will be amended.  ASSESSMENT/PLAN:  This is a 35 year old diabetic male with acute severe right upper quadrant abdominal pain, tenderness and elevated white count.  He has an abnormal gallbladder ultrasound with wall thickening, sludge and possible small stones.  He has no chronic GI history such as ulcer disease to explain his clinical presentation.  I suspect he has acute cholecystitis.  I have recommended proceeding with urgent laparoscopic cholecystectomy with cholangiogram.  He will be taken to the operating room today.     Lorne Skeens. Namine Beahm, [name]     Tory Emerald  D:  12/23/2010  T:  12/23/2010  Job:  [id number]  Electronically Signed by Glenna Fellows [name] on 12/23/2010 03:40:59 PM

## 2010-12-23 NOTE — Op Note (Signed)
NAMEROBBY, [name]              ACCOUNT NO.:  [id number]  MEDICAL RECORD NO.:  [id number]  LOCATION:  1444                         FACILITY:  [facility]  PHYSICIAN:  Sharlet Salina T. Jamin Humphries, M.D.DATE OF BIRTH:  [birth year]  DATE OF PROCEDURE:  12/23/2010 DATE OF DISCHARGE:                              OPERATIVE REPORT   PREOPERATIVE DIAGNOSIS:  Acute cholecystitis.  POSTOPERATIVE DIAGNOSIS:  Acute cholecystitis.  SURGICAL PROCEDURES:  Laparoscopic cholecystectomy with intraoperative cholangiogram.  SURGEON:  Sharlet Salina T. Elisa Sorlie, [name]  ASSISTANT:  Barnetta Chapel, PA  BRIEF HISTORY:  This patient is a 36 year old male who presents with 24 hours of acute severe right upper quadrant abdominal pain.  He has elevated white count and localized tenderness.  Gallbladder ultrasound has shown some irregular thickening of the gallbladder wall and possible sludge or small stones.  I have recommended laparoscopic and possible open cholecystectomy for apparent acute cholecystitis.  Ashby Dawes of the procedure, its indications, risks of anesthetic complications, bleeding, infection, bile leak, bile duct injury were discussed and understood. He is now brought to operating room for this procedure.  DESCRIPTION OF OPERATION:  The patient was brought to the operating room, placed in supine position on the operating room table and general endotracheal anesthesia was induced.  He was already on broad-spectrum IV antibiotics.  PAS were in place.  The abdomen was widely sterilely prepped and draped.  A preprocedure time-out was performed.  Local anesthesia was used to infiltrate the trocar sites.  A supraumbilical incision 0.5 cm in the midline was made and dissection was carried down to the midline fascia which was incised for 1 cm and under direct vision and elevated with Kocher clamps.  The peritoneum entered with careful blunt dissection.  Through mattress suture of 0 Vicryl, the Hassan trocar was  placed and pneumoperitoneum established.  Under direct vision, a 12-mm trocar was placed subxiphoid and two 5 mm trocars on the right subcostal margin.  The omentum was adhered to the gallbladder and it was bluntly stripped down showing a distended, edematous acutely inflamed gallbladder with patchy necrosis.  The gallbladder was tense and was aspirated with the aspiration needle allowing the fundus to be grasped and elevated.  Further omental adhesions were stripped down off the gallbladder and chronic adhesions to the omentum up to the liver edge were carefully taken down with cautery dissection.  The infundibulum was gradually able to be exposed and was grasped and retracted inferolaterally.  Further fibrofatty tissue was carefully taken down off the infundibulum working down towards the porta hepatis in a slow fashion.  The cystic artery was then identified anteriorly coursing up on the gallbladder wall and it was divided between 2 proximal and 1 distal clip.  This allowed further dissection of the distal gallbladder and the cystic duct was identified and the cystic duct gallbladder junction identified and dissected 360 degrees.  When the anatomy was clear, the cystic duct was clipped off the gallbladder junction.  Operative cholangiogram obtained through the cystic duct. This showed good filling of normal common bile duct and intrahepatic ducts with free flow into the duodenum and no filling defects. Following this, cholangiocath was removed and the cystic duct was triply clipped  proximally and divided.  The gallbladder was then dissected free from its bed using hook cautery.  Again noted was marked edema, fibrosis and patchy gangrenous changes.  The gallbladder was able be detached, placed in EndoCatch bag and brought out through the umbilical incision. There were small stones and a lot of sludge in the gallbladder.  The gallbladder bed was irrigated.  Hemostasis obtained with  cautery.  A closed suction drain was left in gallbladder bed and brought out through one of the lateral 5-mm trocar sites.  The abdomen was inspected for hemostasis which appeared complete.  There was no evidence of trocar injury or other problems.  All CO2 was evacuated and mattress suture secured at the umbilicus after removing the [facility] trocar.  Skin incisions were closed with subcuticular Monocryl and Dermabond.  The patient was taken to recovery room in good condition.     [name], [name]     Tory Emerald  D:  12/23/2010  T:  12/23/2010  Job:  [id number]  Electronically Signed by Glenna Fellows [name] on 12/23/2010 03:41:04 PM

## 2010-12-24 LAB — COMPREHENSIVE METABOLIC PANEL
ALT: 48 U/L (ref 0–53)
Albumin: 2.7 g/dL — ABNORMAL LOW (ref 3.5–5.2)
Alkaline Phosphatase: 77 U/L (ref 39–117)
Calcium: 8.9 mg/dL (ref 8.4–10.5)
GFR calc Af Amer: >60 mL/min (ref >60)
Glucose, Bld: 91 mg/dL (ref 70–99)
Potassium: 4 mEq/L (ref 3.5–5.1)
Sodium: 137 mEq/L (ref 135–145)
Total Protein: 7.3 g/dL (ref 6.0–8.3)

## 2010-12-24 LAB — CBC
MCH: 29 pg (ref 26.0–34.0)
MCHC: 32.6 g/dL (ref 30.0–36.0)
Platelets: 209 10*3/uL (ref 150–400)
RDW: 14 % (ref 11.5–15.5)

## 2010-12-24 LAB — GLUCOSE, CAPILLARY
Glucose-Capillary: 76 mg/dL (ref 70–99)
Glucose-Capillary: 84 mg/dL (ref 70–99)

## 2010-12-25 LAB — CBC
HCT: 25.8 % — ABNORMAL LOW (ref 39.0–52.0)
MCH: 29.4 pg (ref 26.0–34.0)
MCV: 88.1 fL (ref 78.0–100.0)
Platelets: 208 10*3/uL (ref 150–400)
RBC: 2.93 MIL/uL — ABNORMAL LOW (ref 4.22–5.81)

## 2010-12-26 NOTE — Discharge Summary (Unsigned)
[name], [name]              ACCOUNT NO.:  [id number]  MEDICAL RECORD NO.:  [id number]  LOCATION:  1444                         FACILITY:  [facility]  PHYSICIAN:  Hartley Barefoot, MD    DATE OF BIRTH:  11-19-1975  DATE OF ADMISSION:  12/22/2010 DATE OF DISCHARGE:  12/25/2010                              DISCHARGE SUMMARY   DISCHARGE DIAGNOSES: 1. Acute gangrenous cholecystitis, status post cholecystectomy. 2. Diabetes. 3. Hypertension. 4. Anemia. 5. Acute illness post surgery.  DISCHARGE MEDICATIONS: 1. Norvasc 5 mg p.o. daily. 2. Augmentin 875 one tablet b.i.d. for 5 days. 3. Ferrous sulfate one tablet p.o. b.i.d. 4. Oxycodone one to two tablet by mouth every 6 hours as needed. 5. Levemir 10 units subcu daily at bedtime. 6. Metformin 500 p.o. b.i.d.  MEDICATIONS TO STOP DURING THIS HOSPITALIZATION:  Lisinopril, ibuprofen, and glipizide.  X-ray:  Abdominal ultrasound irregular nodularity around the margin of the gall bladder and appearance suggestive of cholesterolosis or possibly adenoma.  No definite gallstone, mild dilated common bite with borderline intrahepatic biliary duct dilation.  Cholangiogram showed negative for retained common bile duct stone.  CT angio chest no filling defect is identified in the pulmonary artery tree to suggest pulmonary embolism.  Chest x-ray:  No active cardiopulmonary disease.  BRIEF HISTORY OF PRESENT ILLNESS:  This a very pleasant 35 year old African American male with past medical history of diabetes type 2 who presents complaining of abdominal pain.  He related pain 6/10 in intensity, sharp.  The patient had prior episode in March which improved.  HOSPITAL COURSE: 1. Acute gangrenous cholecystitis, cholelithiasis.  The patient was     admitted to the hospital.  Surgery was consulted and GI.  The     patient was started on Zosyn.  He received 3 days of IV Zosyn.  He     will be discharged on Augmentin for 5 more days.  He is status  post     cholecystectomy on December 23, 2010.  He had an intraoperative     cholangiogram.  The cholangiogram showed good filling of normal     common bile duct and intrahepatic duct with free flow into the     duodenum and no filling defect.  Following this, cholangiocath was     removed and the cystic duct was triply.  The patient tolerating     diet.  He will follow with Surgery.  It was okay to discharge home     today from surgery point of view. 2. Diabetes.  The patient had a couple of hypoglycemic episodes during     this hospitalization.  He is now tolerating diet.  He was     discharged on lower dose of Levemir.  We will hold glipizide.  He     will be discharged on metformin.  We will need to increase his     Levemir and put him back on Glipizide as he tolerates diet and     blood sugar increases. 3. Anemia.  I suspected an acute illness and post surgery, his     hemoglobin has been 8.6 and 9.1.  He will be discharged on ferrous     sulfate  325 p.o. b.i.d. 4. Hypertension.  Lisinopril will be discontinued.  This was changed     to Norvasc. 5. On the day of discharge, the patient was in improved condition,     tolerating diet.  Blood pressure 143/91, sat 99 on room air,     respirations 16, pulse 83, temperature 98.7, white blood cell 11.7,     hemoglobin 8.6, hematocrit 25, platelets 208.  Sodium 137,     potassium 4.0, chloride 101, bicarb 31, glucose 91, BUN 12,     creatinine 1.3.     Hartley Barefoot, MD     BR/MEDQ  D:  12/25/2010  T:  12/25/2010  Job:  [id number]  cc:   [facility]

## 2011-01-02 NOTE — Discharge Summary (Signed)
NAMEDENNYS, GUIN              ACCOUNT NO.:  [id number]  MEDICAL RECORD NO.:  [id number]  LOCATION:  1444                         FACILITY:  [facility]  PHYSICIAN:  Hartley Barefoot, MD    DATE OF BIRTH:  Sep 30, 1975  DATE OF ADMISSION:  12/22/2010 DATE OF DISCHARGE:  12/25/2010                              DISCHARGE SUMMARY   DISCHARGE DIAGNOSES: 1. Acute gangrenous cholecystitis, status post cholecystectomy. 2. Diabetes. 3. Hypertension. 4. Anemia of Acute illness post surgery.  DISCHARGE MEDICATIONS: 1. Norvasc 5 mg p.o. daily. 2. Augmentin 875 one tablet b.i.d. for 5 days. 3. Ferrous sulfate one tablet p.o. b.i.d. 4. Oxycodone one to two tablet by mouth every 6 hours as needed. 5. Levemir 10 units subcu daily at bedtime. 6. Metformin 500 p.o. b.i.d.  MEDICATIONS TO STOP DURING THIS HOSPITALIZATION:  Lisinopril, ibuprofen, and glipizide.  X-ray:  Abdominal ultrasound irregular nodularity around the margin of the gall bladder and appearance suggestive of cholesterolosis or possibly adenoma.  No definite gallstone, mild dilated common bite with borderline intrahepatic biliary duct dilation.  Cholangiogram showed negative for retained common bile duct stone.  CT angio chest no filling defect is identified in the pulmonary artery tree to suggest pulmonary embolism.  Chest x-ray:  No active cardiopulmonary disease.  BRIEF HISTORY OF PRESENT ILLNESS:  This a very pleasant 35 year old African American male with past medical history of diabetes type 2 who presents complaining of abdominal pain.  He related pain 6/10 in intensity, sharp.  The patient had prior episode in March which improved.  HOSPITAL COURSE: 1. Acute gangrenous cholecystitis, cholelithiasis.  The patient was     admitted to the hospital.  Surgery and GI were consulted.  The     patient was started on Zosyn.  He received 3 days of IV Zosyn.  He     will be discharged on Augmentin for 5 more days.  He is status  post     cholecystectomy on December 23, 2010.  He had an intraoperative     cholangiogram.  The cholangiogram showed good filling of normal     common bile duct and intrahepatic duct with free flow into the     duodenum and no filling defect.  Following this, cholangiocath was     removed and the cystic duct was triply.  The patient tolerating     diet.  He will follow with Surgery.  It was okay to discharge home     today from surgery point of view. 2. Diabetes.  The patient had a couple of hypoglycemic episodes during     this hospitalization.  He is now tolerating diet.  He was     discharged on lower dose of Levemir.  We will hold glipizide.  He     will be discharged on metformin.  We will need to increase his     Levemir and put him back on Glipizide as he tolerates diet and     blood sugar increases. 3. Anemia.  I suspected an acute illness and post surgery, his     hemoglobin has been 8.6 and 9.1.  He will be discharged on ferrous     sulfate  325 p.o. b.i.d. 4. Hypertension.  Lisinopril will be discontinued.  This was changed    to Norvasc. 5. On the day of discharge, the patient was in improved condition,     tolerating diet.  Blood pressure 143/91, sat 99 on room air,     respirations 16, pulse 83, temperature 98.7, white blood cell 11.7,     hemoglobin 8.6, hematocrit 25, platelets 208.  Sodium 137,     potassium 4.0, chloride 101, bicarb 31, glucose 91, BUN 12,     creatinine 1.3.     Hartley Barefoot, MD     BR/MEDQ  D:  12/25/2010  T:  12/31/2010  Job:  [id number]  cc:   [facility]  Electronically Signed by Hartley Barefoot MD on 01/02/2011 08:46:40 PM

## 2011-01-03 NOTE — H&P (Signed)
[name], Clark              ACCOUNT NO.:  [id number]  MEDICAL RECORD NO.:  [id number]           PATIENT TYPE:  O  LOCATION:  1839                         FACILITY:  [facility]  PHYSICIAN:  Baltazar Najjar, MD     DATE OF BIRTH:  [birth year]  DATE OF ADMISSION:  10/27/2010 DATE OF DISCHARGE:                             HISTORY & PHYSICAL   PRIMARY CARE PHYSICIAN:  [facility].  CODE STATUS:  Full code.  CHIEF COMPLAINT:  Abdominal pain and vomiting.  HISTORY OF PRESENT ILLNESS:  [name] is a 35 year old African- American man with history of diabetes type 1.  He was recently hospitalized for cellulitis of his left middle toe treated with antibiotics, discharged on Oct 17, 2010.  As per him, he had nausea and vomiting since discharge and his condition worsened about a week ago with progressive nausea or vomiting and abdominal pain in his right lower quadrant mostly.  He denies any fever or chills.  Denies any diarrhea.  As per him, he did not have bowel movement for 3 days.  He described his abdominal pain as intermittent, sharp, mostly located on his right lower quadrant 7/10 and is worse associated with nausea and vomiting.  He cannot keep anything down in his stomach.  As per him, yesterday he noticed some blood in his vomitus, which he describes as small amount; however, his vomitus color was basically when he eats or drink.  In the ER, the patient was kept in the CDU for observation, was treated with Zofran and IV fluid; however, his symptoms did not improve and I was asked to see him for further workup and admission.  I was called by Ms. Green, the PA, who initially stated that his abdominal exam is unremarkable and his CT scan is unremarkable.  However, on further when I saw the patient, the patient did have right lower quadrant abdominal pain and his CT scan showed some finding, which will be described below.  PAST MEDICAL HISTORY: 1. History of diabetes mellitus  type 1. 2. Hypertension. 3. History of tobacco abuse. 4. Recent hospitalization for left second toe cellulitis treated with     antibiotics.  ALLERGIES:  No known drug allergy.  MEDICATIONS:  Home medications:  Medication reconciliation form is still pending, however, as per his records he is on, 1. Levemir insulin 20 units at bedtime. 2. Lisinopril 20 mg daily. 3. Metformin 500 mg b.i.d.  We are awaiting verification and     reconciliation of his medication at the time of dictation.  SOCIAL HISTORY:  Lives with fiance.  He smoked two cigarettes a day as per him is for a year.  Denies any drinking of alcohol or illicit drug use.  He is currently unemployed.  FAMILY HISTORY:  Significant for diabetes and hypertension in his immediate family member.  REVIEW OF SYSTEMS:  CHEST:  Denies any shortness of breath or cough. CARDIOVASCULAR:  No chest pain or palpitation.  ABDOMEN:  As above in HPI, abdominal pain, nausea, vomiting, and constipation.  GU:  The patient denies any dysuria, urinary frequency, hematuria. CONSTITUTIONAL:  No fever or chills.  PHYSICAL  EXAM:  VITAL SIGNS:  Blood pressure 162/104, pulse rate of 20, temperature 98.7, O2 sat 99% on room air.  He is alert in moderate distress. NECK:  Supple.  No JVD. CHEST:  Clear to auscultation bilaterally. CARDIOVASCULAR:  S1 and S2.  Regular rhythm and rate. ABDOMEN:  Soft.  He has right lower quadrant tenderness on palpation. No rebound.  No guarding.  Bowel sounds positive. EXTREMITIES:  Showed no edema.  He does have a right upper plantar corn that is chronic.  His left foot showed third toe partial amputation, second toe chronic cellulitic changes with no erythema, no drainage.  LABORATORY DATA:  Potassium 3.7, BUN 22, creatinine 2.24, glucose 98. Urinalysis showed 3-6 wbc's, 3-6 rbc's, small blood, 100 mg/dL of protein.  CBC showed white count of 9, hemoglobin 10.3, hematocrit 30.3, platelet count  378,000.  RADIOLOGY/IMAGING:  CT of the abdomen and pelvis showed single and large 3.5 x 2.1 cm mesenteric node, anterior to the distal aorta with surrounding soft tissue stranding.  This may reflect a mild focal infectious or inflammatory process, perhaps focal mesenteric adenitis. Incomplete sense and rotation of the right kidney with mild associated perinephric stranding.  Small focal increase attenuation at the distal esophagus may reflect normal intraluminal content or possibly a small polyp.  ASSESSMENT/PLAN: 1. Abdominal pain/nausea and vomiting.  Unclear etiology at this time. 2. Abnormal CT of abdomen findings, suggestive of possible focal     mesenteric adenitis. 3. Diabetes mellitus type 1. 4. Hypertension. 5. Tobacco abuse. 6. Acute  kidney injury.  PLAN: 1. The patient will be kept n.p.o.  I will hydrate him normal saline     at 150 mL/hr. 2. I will empirically start him on ciprofloxacin and Flagyl for     possible mesenteric adenitis, although infectious etiology is not     confirmed yet and the patient does not have systemic symptoms to     suggest any infectious etiology at this time.  No fever.  No     chills.  So antibiotic can be discontinued if not felt to be     helpful. 3. I will send the urine for urine culture given perinephric stranding     on CT scan.  The patient will already be on ciprofloxacin as above. 4. Acute kidney injury, most likely prerenal azotemia secondary to     dehydration.  We will continue with IV fluids and monitor renal     function. 5. Since he is n.p.o., we will check his CBG q.4 h.  I will hold long-     acting insulin and cover him with insulin sliding scale.  Hold     metformin as well. 6. For hypertension, given acute kidney injury, I will hold ACE     inhibitor, however, we will treat with calcium-channel blocker. 7. Dr. Leone Payor from GI was consulted and he will kindly see him on     consultation. 8. The patient stated that  he had small amount of blood in his     vomitus, which I think is probably gastric irritation secondary to     vigorous vomiting, I am not anticipating any major GI bleed on him,     however, I will monitor his H and H q.8 h and place him on PPI     pending further GI evaluation. 9. Consult wound care. 10.The patient will be placed on nicotine patch and counseled on     tobacco cessation. 11.Symptomatic management for his nausea  and vomiting with Phenergan     and Zofran. 12.Continue to monitor the patient's progress and advance diet as     tolerated. 13.Code status.  The patient is full code.          ______________________________ Baltazar Najjar, MD     SA/MEDQ  D:  10/28/2010  T:  10/28/2010  Job:  [id number]  cc:   [facility]  Electronically Signed by Hannah Beat MD on 01/03/2011 03:08:48 PM

## 2011-01-07 ENCOUNTER — Encounter (INDEPENDENT_AMBULATORY_CARE_PROVIDER_SITE_OTHER): Payer: Self-pay | Admitting: Radiology

## 2011-01-07 ENCOUNTER — Ambulatory Visit (INDEPENDENT_AMBULATORY_CARE_PROVIDER_SITE_OTHER): Payer: Medicaid Other | Admitting: Radiology

## 2011-01-07 DIAGNOSIS — K810 Acute cholecystitis: Secondary | ICD-10-CM

## 2011-01-07 NOTE — Progress Notes (Signed)
[name] is a 35 y.o. male who had a laparoscopic cholecystectomy with intraoperative cholangiogram by Dr. Johna Sheriff about 2 weeks ago  The pathology report confirmed cholecystitis and choleltihasis.  The patient reports that they are feeling well with normal bowel movements and good appetite.  The pre-operative symptoms of abdominal pain, nausea, and vomiting have resolved.    Physical examination - Incisions appear well-healed with no sign of infection or bleeding.   Abdomen - soft, non-tender  Impression:  s/p laparoscopic cholecystectomy  Plan:  Resume regular diet and full activity.  Follow-up on a PRN basis. He is also clear to return to work, note provided.

## 2011-01-08 NOTE — H&P (Signed)
NAMEMICHIAL, [name]              ACCOUNT NO.:  [id number]  MEDICAL RECORD NO.:  [id number]  LOCATION:  [facility]                         FACILITY:  [facility]  PHYSICIAN:  Celso Amy, MD   DATE OF BIRTH:  May 22, 1976  DATE OF ADMISSION:  12/22/2010 DATE OF DISCHARGE:                             HISTORY & PHYSICAL   PRIMARY CARE:  [facility].  CHIEF COMPLAINT:  Abdominal pain.  HISTORY OF PRESENT ILLNESS:  The patient is a 35 year old African American male with a past medical history of diabetes mellitus type 1 who presented to the ER with a chief complaint of abdominal pain. History of present illness dates back to this a.m. when the patient had sudden onset of abdominal pain.  It is right upper quadrant in location, 6 out of 10 in intensity, sharp, stabbing in quality and nonradiating. This increases on movement and decreases with pain medication.  The patient has 1 episode of emesis, no blood.  Complained of cough.  Nocomplaint of fever or chills.  No complaint of dyspnea or chest pain. No complaint of syncope.  No complaint of diarrhea.  No complaint of bright red blood per rectum.  The patient had a similar episode in May which improved, but the patient had recurrence of abdominal pain this morning.  Otherwise, he was in good health until this a.m.  ALLERGIES:  The patient has no known drug allergies.  SOCIAL HISTORY:  The patient lives with fiancee.  Continues to smoke. Denies drinking, but occasionally used marijuana.  FAMILY HISTORY:  Positive for hypertension and diabetes.  REVIEW OF SYSTEMS:  Negative.  PAST MEDICAL HISTORY:  Positive for: 1. Diabetes mellitus type 1. 2. Hypertension. 3. Tobacco abuse. 4. Chronic osteomyelitis. 5. Mesenteric adenitis. 6. History of acute kidney injury.  MEDICATIONS:  As outpatient, the patient is on: 1. Levemir insulin 20 units q.h.s. 2. Lisinopril 20 mg p.o. daily. 3. Metformin 500 mg p.o. b.i.d. 4. Norvasc 10 mg p.o.  daily. 5. Aspirin 81 mg p.o. daily. 6. Glipizide 10 mg p.o. daily. 7. Zofran p.r.n. 8. Vicodin p.r.n.  He said he was not taking Norvasc or aspirin.  PHYSICAL EXAMINATION:  VITAL SIGNS:  Blood pressure 136/70, pulse 80, respiratory rate 16, temperature afebrile. GENERAL:  On general examination, the patient is awake, alert, oriented to time, place and person, well built, well nourished, lying comfortably on the bed. HEENT: Pupils equally reactive to light and accommodation.  Extraocular movements intact.  Head is atraumatic, normocephalic. RESPIRATORY SYSTEM:  No acute respiratory distress. CHEST:  Clear to auscultation bilaterally. CARDIOVASCULAR:  S1, S2.  Regular rate and rhythm.  No murmur appreciated. GI:  Bowel sounds are present.  Abdomen is soft, nondistended, is tender over the right upper quadrant. EXTREMITIES:  No lower extremity edema.  No cyanosis was seen. CNS:  Cranial nerves II through XII are grossly intact.  Moving all 4 extremities.  Strength is 5/5 in both upper and lower extremities. PSYCH:  The patient has normal mood and affect.  LABORATORY DATA:  The patient's sodium is 137, potassium 4.4, serum chloride 102, bicarb 28, BUN 19, serum creatinine 1.3.  The patient's glucose is 140.  The patient's WBC are high  at 12.8.  The patient's hemoglobin is 11, platelets 242.  The patient's UA is positive for protein.  His D-dimer was high at 0.92.  The patient's ALP is 71, AST is 22, ALT is 18.  Lipase was 17.  Albumin is 3.5.  The patient had CT angio done because the D-dimer was high and did not show any PE.  The patient had abdominal ultrasound done, which at this point is not visible in E-chart, but is visible in the ER system, which showed irregular nodularity along the margin of gallbladder and appearance suggesting cholesterolosis or possible adenomatous of gallbladder.  No definite gallstones observed.  There is likely sludge in the gallbladder.  Mildly  dilated common bile duct with borderline intrahepatic biliary dilatation.  This is likely related to choledocholithiasis.  Right pelvic kidney.  IMPRESSION: 1. Gastrointestinal:  The patient is being admitted with abdominal     pain.  This is choledocholithiasis.  Surgery was consulted in the     ER.  This is biliary dilatation in the abdominal ultrasound and the     patient's WBCs are high, so this could questionably be cholangitis. 2. Diabetes mellitus type 1.  The patient has a 20 plus history of     diabetes mellitus type 1, is stable at this point. 3. Cardiovascular:  The patient is hemodynamically stable. 4. Hypertension.  The patient's blood pressure is right now stable. 5. DVT.  The patient will need DVT prophylaxis.  PLAN: 1. We will admit the patient to regular floor. 2. We will keep the patient n.p.o. 3. We will start the patient on IV antibiotics for possible     cholangitis. 4. We will ask for GI consult in the a.m. to help with the need of     ERCP. 5. We will continue half dose of insulin as the patient is being kept     n.p.o. 6. We will hold the patient's ACE and metformin, as the patient's     volume status is fragile in SIRS state and the     patient has a history of acute kidney injury in the past. 7. We will keep the patient on DVT prophylaxis.    The patient's further  course depends upon how he does with this treatment plan.     Celso Amy, MD     MB/MEDQ  D:  12/22/2010  T:  12/22/2010  Job:  [id number]  Electronically Signed by Celso Amy [name] on 01/08/2011 21:30:86 PM

## 2011-02-28 LAB — I-STAT 8, (EC8 V) (CONVERTED LAB)
Acid-Base Excess: 3 — ABNORMAL HIGH
Chloride: 98
HCT: 46
Hemoglobin: 15.6
Operator id: 272551
Potassium: 4
Sodium: 135
TCO2: 31
pH, Ven: 7.356 — ABNORMAL HIGH

## 2011-02-28 LAB — POCT I-STAT CREATININE
Creatinine, Ser: 0.9
Operator id: 272551

## 2011-03-03 LAB — I-STAT 8, (EC8 V) (CONVERTED LAB)
Chloride: 102
Glucose, Bld: 355 — ABNORMAL HIGH
Potassium: 4.3
pCO2, Ven: 43.5 — ABNORMAL LOW
pH, Ven: 7.443 — ABNORMAL HIGH

## 2011-03-03 LAB — CBC
HCT: 42.1
Hemoglobin: 14.2
MCHC: 33.7
RBC: 4.86
RDW: 13.3

## 2011-03-03 LAB — URINALYSIS, ROUTINE W REFLEX MICROSCOPIC
Bilirubin Urine: NEGATIVE
Hgb urine dipstick: NEGATIVE
Ketones, ur: NEGATIVE
Nitrite: NEGATIVE
Protein, ur: 30 — AB
Urobilinogen, UA: 1

## 2011-03-03 LAB — DIFFERENTIAL
Basophils Absolute: 0
Basophils Relative: 0
Eosinophils Relative: 1
Monocytes Absolute: 0.5
Monocytes Relative: 7
Neutro Abs: 4

## 2011-03-03 LAB — POCT I-STAT CREATININE: Operator id: 161631

## 2011-03-03 LAB — RAPID STREP SCREEN (MED CTR MEBANE ONLY): Streptococcus, Group A Screen (Direct): NEGATIVE

## 2011-03-03 LAB — URINE MICROSCOPIC-ADD ON

## 2011-03-04 LAB — CBC
HCT: 40.5
MCHC: 33.3
MCV: 86.8
Platelets: 300
RDW: 13.8

## 2011-03-04 LAB — URINALYSIS, ROUTINE W REFLEX MICROSCOPIC
Leukocytes, UA: NEGATIVE
Nitrite: NEGATIVE
Protein, ur: >300 — AB
Specific Gravity, Urine: 1.033 — ABNORMAL HIGH
Urobilinogen, UA: 1

## 2011-03-04 LAB — COMPREHENSIVE METABOLIC PANEL
Albumin: 3.4 — ABNORMAL LOW
BUN: 10
Calcium: 9
Chloride: 98
Creatinine, Ser: 0.84
Total Bilirubin: 0.8
Total Protein: 7.6

## 2011-03-04 LAB — DIFFERENTIAL
Basophils Absolute: 0
Lymphocytes Relative: 11 — ABNORMAL LOW
Lymphs Abs: 1.3
Monocytes Absolute: 0.5
Neutro Abs: 9.5 — ABNORMAL HIGH

## 2011-03-04 LAB — URINE MICROSCOPIC-ADD ON

## 2011-03-07 LAB — DIFFERENTIAL
Basophils Absolute: 0
Basophils Relative: 0
Eosinophils Absolute: 0.1
Monocytes Absolute: 1
Neutro Abs: 7.8 — ABNORMAL HIGH
Neutrophils Relative %: 71

## 2011-03-07 LAB — POCT I-STAT, CHEM 8
BUN: 12
Creatinine, Ser: 1.3
Glucose, Bld: 489 — ABNORMAL HIGH
Hemoglobin: 15.3
Potassium: 4.1

## 2011-03-07 LAB — URINALYSIS, ROUTINE W REFLEX MICROSCOPIC
Bilirubin Urine: NEGATIVE
Nitrite: NEGATIVE
Protein, ur: 30 — AB
Specific Gravity, Urine: 1.038 — ABNORMAL HIGH
Urobilinogen, UA: 1

## 2011-03-07 LAB — CBC
HCT: 40.6
Hemoglobin: 14.1
MCHC: 34.7
RBC: 4.68

## 2011-03-07 LAB — URINE MICROSCOPIC-ADD ON

## 2011-03-11 LAB — CULTURE, ROUTINE-ABSCESS

## 2011-04-11 ENCOUNTER — Encounter (HOSPITAL_COMMUNITY): Payer: Self-pay | Admitting: Pharmacy Technician

## 2011-04-13 ENCOUNTER — Encounter (HOSPITAL_COMMUNITY): Payer: Self-pay

## 2011-04-13 ENCOUNTER — Emergency Department (HOSPITAL_COMMUNITY)
Admission: EM | Admit: 2011-04-13 | Discharge: 2011-04-14 | Disposition: A | Discharge status: Home / Self Care | Payer: Self-pay | Attending: Emergency Medicine | Admitting: Emergency Medicine

## 2011-04-13 DIAGNOSIS — F17200 Nicotine dependence, unspecified, uncomplicated: Secondary | ICD-10-CM | POA: Insufficient documentation

## 2011-04-13 DIAGNOSIS — J020 Streptococcal pharyngitis: Secondary | ICD-10-CM | POA: Insufficient documentation

## 2011-04-13 DIAGNOSIS — I10 Essential (primary) hypertension: Secondary | ICD-10-CM | POA: Insufficient documentation

## 2011-04-13 DIAGNOSIS — E119 Type 2 diabetes mellitus without complications: Secondary | ICD-10-CM | POA: Insufficient documentation

## 2011-04-13 HISTORY — DX: Essential (primary) hypertension: I10

## 2011-04-13 NOTE — ED Notes (Signed)
Pt complains of a sore throat, fever and chills with a lower back and side pain for two days

## 2011-04-14 MED ORDER — PREDNISONE 50 MG PO TABS
ORAL_TABLET | ORAL | Status: AC
  Filled 2011-04-14: qty 1

## 2011-04-14 MED ORDER — PREDNISONE 10 MG PO TABS: 20.0000 mg | ORAL_TABLET | Freq: Every day | ORAL | Status: DC

## 2011-04-14 MED ORDER — PENICILLIN G BENZATHINE 1200000 UNIT/2ML IM SUSP
1.2000 10*6.[IU] | Freq: Once | INTRAMUSCULAR | Status: AC
  Administered 2011-04-14: 1.2 10*6.[IU] via INTRAMUSCULAR

## 2011-04-14 MED ORDER — PREDNISONE 50 MG PO TABS
50.0000 mg | ORAL_TABLET | Freq: Once | ORAL | Status: AC
  Administered 2011-04-14: 50 mg via ORAL

## 2011-04-14 MED ORDER — HYDROCODONE-ACETAMINOPHEN 7.5-500 MG/15ML PO SOLN: 15.0000 mL | Freq: Four times a day (QID) | ORAL | Status: DC | PRN

## 2011-04-14 MED ORDER — PENICILLIN G BENZATHINE 1200000 UNIT/2ML IM SUSP
INTRAMUSCULAR | Status: AC
  Filled 2011-04-14: qty 2

## 2011-04-14 NOTE — ED Provider Notes (Signed)
Medical screening examination/treatment/procedure(s) were performed by non-physician practitioner and as supervising physician I was immediately available for consultation/collaboration.   Nat Christen, MD 04/14/11 [phone]

## 2011-04-14 NOTE — ED Provider Notes (Signed)
History     CSN: [id number] Arrival date & time: 04/13/2011  8:44 PM   None     No chief complaint on file.   (Consider location/radiation/quality/duration/timing/severity/associated sxs/prior treatment) HPI Comments: Patient states that he's had a sore throat for the last 2 days. He's having pain and difficulty swallowing but is not having trouble breathing. He denies any headache, ear pain, change in vision, sinus pressure, or cough. Patient states that he has had fevers, night sweats, and chills.  The history is provided by the patient.    Past Medical History  Diagnosis Date  . Diabetes mellitus   . Hypertension     Past Surgical History  Procedure Date  . Cholecystectomy     History reviewed. No pertinent family history.  History  Substance Use Topics  . Smoking status: Current Some Day Smoker -- 1.0 packs/day    Types: Cigarettes  . Smokeless tobacco: Not on file  . Alcohol Use: No      Review of Systems  HENT: Negative for ear pain, congestion, rhinorrhea and sinus pressure.   Respiratory: Negative for chest tightness, shortness of breath and wheezing.   Cardiovascular: Negative for palpitations.    Allergies  Review of patient's allergies indicates no known allergies.  Home Medications   Current Outpatient Rx  Name Route Sig Dispense Refill  . GLIPIZIDE 10 MG PO TABS Oral Take 10 mg by mouth daily.      [name] LEVEMIR FLEXPEN [place] Subcutaneous Inject 20 Units into the skin at bedtime as needed.      [name] LISINOPRIL PO Oral Take 20 mg by mouth daily.      [name] METFORMIN HCL 1000 MG PO TABS Oral Take 500 mg by mouth 2 (two) times daily.       BP 128/77  Pulse 95  Temp(Src) 99.4 F (37.4 C) (Oral)  Resp 18  SpO2 100%  Physical Exam  Constitutional: He is oriented to person, place, and time. He appears well-developed and well-nourished. No distress.  HENT:  Head: Normocephalic and atraumatic. No trismus in the jaw.  Nose: Nose normal.  Mouth/Throat:  Mucous membranes are not dry. No oral lesions. No uvula swelling. Oropharyngeal exudate, posterior oropharyngeal edema and posterior oropharyngeal erythema present. No tonsillar abscesses.  Eyes: Conjunctivae and EOM are normal. No scleral icterus.  Neck: Normal range of motion. Neck supple. No tracheal deviation present. No thyromegaly present.  Cardiovascular: Normal rate, regular rhythm, normal heart sounds and intact distal pulses.   Pulmonary/Chest: Effort normal and breath sounds normal. No stridor.  Musculoskeletal: Normal range of motion. He exhibits no edema and no tenderness.  Neurological: He is alert and oriented to person, place, and time. He has normal reflexes. Coordination normal.  Skin: Skin is warm and dry. No rash noted. He is not diaphoretic. No erythema. No pallor.  Psychiatric: He has a normal mood and affect. His behavior is normal.    ED Course  Procedures (including critical care time)  Labs Reviewed - No data to display No results found.   No diagnosis found.    MDM    Jaci Carrel, [place] 04/14/11 [phone]

## 2011-04-15 ENCOUNTER — Encounter (HOSPITAL_COMMUNITY)
Admission: RE | Disposition: A | Discharge status: Home / Self Care | Payer: Self-pay | Source: Ambulatory Visit | Attending: Orthopedic Surgery

## 2011-04-15 ENCOUNTER — Encounter (HOSPITAL_COMMUNITY): Payer: Self-pay

## 2011-04-15 ENCOUNTER — Ambulatory Visit (HOSPITAL_COMMUNITY)
Admission: RE | Admit: 2011-04-15 | Discharge: 2011-04-15 | Disposition: A | Discharge status: Home / Self Care | Payer: Self-pay | Source: Ambulatory Visit | Attending: Orthopedic Surgery | Admitting: Orthopedic Surgery

## 2011-04-15 DIAGNOSIS — M1610 Unilateral primary osteoarthritis, unspecified hip: Secondary | ICD-10-CM | POA: Insufficient documentation

## 2011-04-15 DIAGNOSIS — I10 Essential (primary) hypertension: Secondary | ICD-10-CM | POA: Insufficient documentation

## 2011-04-15 DIAGNOSIS — Z538 Procedure and treatment not carried out for other reasons: Secondary | ICD-10-CM | POA: Insufficient documentation

## 2011-04-15 DIAGNOSIS — M169 Osteoarthritis of hip, unspecified: Secondary | ICD-10-CM | POA: Insufficient documentation

## 2011-04-15 DIAGNOSIS — E119 Type 2 diabetes mellitus without complications: Secondary | ICD-10-CM | POA: Insufficient documentation

## 2011-04-15 SURGERY — AMPUTATION, FOOT, RAY
Anesthesia: General | Site: Foot | Laterality: Left

## 2011-04-15 SURGICAL SUPPLY — 39 items
BANDAGE ESMARK 6X9 LF (GAUZE/BANDAGES/DRESSINGS) IMPLANT
BLADE SAW SGTL MED 73X18.5 STR (BLADE) IMPLANT
BNDG CMPR 9X6 STRL LF SNTH (GAUZE/BANDAGES/DRESSINGS)
BNDG COHESIVE 4X5 TAN STRL (GAUZE/BANDAGES/DRESSINGS) ×2 IMPLANT
BNDG COHESIVE 6X5 TAN STRL LF (GAUZE/BANDAGES/DRESSINGS) ×2 IMPLANT
BNDG ESMARK 6X9 LF (GAUZE/BANDAGES/DRESSINGS)
CLOTH BEACON ORANGE TIMEOUT ST (SAFETY) ×2 IMPLANT
CUFF TOURNIQUET SINGLE 34IN LL (TOURNIQUET CUFF) IMPLANT
CUFF TOURNIQUET SINGLE 44IN (TOURNIQUET CUFF) IMPLANT
DRAPE U-SHAPE 47X51 STRL (DRAPES) ×4 IMPLANT
DRSG ADAPTIC 3X8 NADH LF (GAUZE/BANDAGES/DRESSINGS) ×2 IMPLANT
DURAPREP 26ML APPLICATOR (WOUND CARE) ×2 IMPLANT
ELECT REM PT RETURN 9FT ADLT (ELECTROSURGICAL) ×2
ELECTRODE REM PT RTRN 9FT ADLT (ELECTROSURGICAL) ×1 IMPLANT
GLOVE BIOGEL PI IND STRL 9 (GLOVE) ×1 IMPLANT
GLOVE BIOGEL PI INDICATOR 9 (GLOVE) ×1
GLOVE SURG ORTHO 9.0 STRL STRW (GLOVE) ×2 IMPLANT
GOWN PREVENTION PLUS XLARGE (GOWN DISPOSABLE) ×2 IMPLANT
GOWN SRG XL XLNG 56XLVL 4 (GOWN DISPOSABLE) ×1 IMPLANT
GOWN STRL NON-REIN XL XLG LVL4 (GOWN DISPOSABLE) ×2
KIT BASIN OR (CUSTOM PROCEDURE TRAY) ×2 IMPLANT
KIT ROOM TURNOVER OR (KITS) ×2 IMPLANT
MANIFOLD NEPTUNE II (INSTRUMENTS) ×2 IMPLANT
NS IRRIG 1000ML POUR BTL (IV SOLUTION) ×2 IMPLANT
PACK ORTHO EXTREMITY (CUSTOM PROCEDURE TRAY) ×2 IMPLANT
PAD ARMBOARD 7.5X6 YLW CONV (MISCELLANEOUS) ×2 IMPLANT
PAD CAST 4YDX4 CTTN HI CHSV (CAST SUPPLIES) ×1 IMPLANT
PADDING CAST COTTON 4X4 STRL (CAST SUPPLIES) ×2
SPONGE GAUZE 4X4 12PLY (GAUZE/BANDAGES/DRESSINGS) ×2 IMPLANT
SPONGE LAP 18X18 X RAY DECT (DISPOSABLE) ×2 IMPLANT
STAPLER VISISTAT 35W (STAPLE) ×2 IMPLANT
STOCKINETTE IMPERVIOUS LG (DRAPES) IMPLANT
SUCTION FRAZIER TIP 10 FR DISP (SUCTIONS) ×2 IMPLANT
SUT ETHILON 2 0 PSLX (SUTURE) ×4 IMPLANT
TOWEL OR 17X24 6PK STRL BLUE (TOWEL DISPOSABLE) ×2 IMPLANT
TOWEL OR 17X26 10 PK STRL BLUE (TOWEL DISPOSABLE) ×2 IMPLANT
TUBE CONNECTING 12X1/4 (SUCTIONS) ×2 IMPLANT
UNDERPAD 30X30 INCONTINENT (UNDERPADS AND DIAPERS) ×2 IMPLANT
WATER STERILE IRR 1000ML POUR (IV SOLUTION) ×2 IMPLANT

## 2011-04-16 NOTE — H&P (Deleted)
ROS  Physical Exam     

## 2011-04-22 ENCOUNTER — Encounter (HOSPITAL_COMMUNITY): Payer: Self-pay | Admitting: Pharmacy Technician

## 2011-04-23 ENCOUNTER — Encounter (HOSPITAL_COMMUNITY): Payer: Self-pay | Admitting: Pharmacy Technician

## 2011-04-24 ENCOUNTER — Encounter (HOSPITAL_COMMUNITY)
Admission: RE | Admit: 2011-04-24 | Discharge: 2011-04-24 | Disposition: A | Discharge status: Home / Self Care | Payer: Medicaid Other | Source: Ambulatory Visit | Attending: Orthopedic Surgery | Admitting: Orthopedic Surgery

## 2011-04-24 ENCOUNTER — Encounter (HOSPITAL_COMMUNITY): Payer: Self-pay

## 2011-04-24 ENCOUNTER — Other Ambulatory Visit (HOSPITAL_COMMUNITY): Payer: Self-pay | Admitting: Orthopedic Surgery

## 2011-04-24 DIAGNOSIS — Z01818 Encounter for other preprocedural examination: Secondary | ICD-10-CM | POA: Insufficient documentation

## 2011-04-24 DIAGNOSIS — Z01812 Encounter for preprocedural laboratory examination: Secondary | ICD-10-CM | POA: Insufficient documentation

## 2011-04-24 HISTORY — DX: Unspecified disorder of circulatory system: I99.9

## 2011-04-24 LAB — CBC
Platelets: 279 10*3/uL (ref 150–400)
RBC: 3.89 MIL/uL — ABNORMAL LOW (ref 4.22–5.81)
RDW: 14 % (ref 11.5–15.5)
WBC: 10.9 10*3/uL — ABNORMAL HIGH (ref 4.0–10.5)

## 2011-04-24 LAB — BASIC METABOLIC PANEL
CO2: 31 mEq/L (ref 19–32)
Calcium: 10.1 mg/dL (ref 8.4–10.5)
Creatinine, Ser: 1.1 mg/dL (ref 0.50–1.35)
GFR calc Af Amer: >90 mL/min (ref >90)
GFR calc non Af Amer: 86 mL/min — ABNORMAL LOW (ref >90)
Sodium: 142 mEq/L (ref 135–145)

## 2011-04-24 LAB — HEPATIC FUNCTION PANEL
Albumin: 3.4 g/dL — ABNORMAL LOW (ref 3.5–5.2)
Alkaline Phosphatase: 88 U/L (ref 39–117)
Total Protein: 8.4 g/dL — ABNORMAL HIGH (ref 6.0–8.3)

## 2011-04-24 LAB — SURGICAL PCR SCREEN: MRSA, PCR: NEGATIVE

## 2011-04-24 MED ORDER — CEFAZOLIN SODIUM-DEXTROSE 2-3 GM-% IV SOLR
2.0000 g | INTRAVENOUS | Status: DC
  Filled 2011-04-24: qty 50

## 2011-04-24 NOTE — Pre-Procedure Instructions (Signed)
21 [name]  04/24/2011   Your procedure is scheduled on:  Friday, Nov 16  Report to [facility] at 0930 AM.  Call this number if you have problems the morning of surgery: [phone]   Remember:   Do not eat food:After Midnight.  Do not drink clear liquids: 4 Hours before arrival.  Take these medicines the morning of surgery with A SIP OF WATER: none; take 1/2  Insulin dose tonight with a snack   Do not wear jewelry, make-up or nail polish.  Do not wear lotions, powders, or perfumes. You may wear deodorant.  Do not shave 48 hours prior to surgery.  Do not bring valuables to the hospital.  Contacts, dentures or bridgework may not be worn into surgery.  Leave suitcase in the car. After surgery it may be brought to your room.  For patients admitted to the hospital, checkout time is 11:00 AM the day of discharge.   Patients discharged the day of surgery will not be allowed to drive home.  Name and phone number of your driver: N/A  Special Instructions: CHG Shower Use Special Wash: 1/2 bottle night before surgery and 1/2 bottle morning of surgery.   Please read over the following fact sheets that you were given: Pain Booklet

## 2011-04-25 ENCOUNTER — Encounter (HOSPITAL_COMMUNITY): Payer: Self-pay

## 2011-04-25 ENCOUNTER — Ambulatory Visit (HOSPITAL_COMMUNITY)
Admission: RE | Admit: 2011-04-25 | Discharge: 2011-04-25 | Disposition: A | Discharge status: Home / Self Care | Payer: Self-pay | Source: Ambulatory Visit | Attending: Orthopedic Surgery | Admitting: Orthopedic Surgery

## 2011-04-25 ENCOUNTER — Encounter (HOSPITAL_COMMUNITY)
Admission: RE | Disposition: A | Discharge status: Home / Self Care | Payer: Self-pay | Source: Ambulatory Visit | Attending: Orthopedic Surgery

## 2011-04-25 ENCOUNTER — Encounter (HOSPITAL_COMMUNITY): Payer: Self-pay | Admitting: *Deleted

## 2011-04-25 ENCOUNTER — Other Ambulatory Visit: Payer: Self-pay | Admitting: Orthopedic Surgery

## 2011-04-25 ENCOUNTER — Ambulatory Visit (HOSPITAL_COMMUNITY): Payer: Self-pay

## 2011-04-25 DIAGNOSIS — M9080 Osteopathy in diseases classified elsewhere, unspecified site: Secondary | ICD-10-CM | POA: Insufficient documentation

## 2011-04-25 DIAGNOSIS — Z01812 Encounter for preprocedural laboratory examination: Secondary | ICD-10-CM | POA: Insufficient documentation

## 2011-04-25 DIAGNOSIS — M869 Osteomyelitis, unspecified: Secondary | ICD-10-CM | POA: Insufficient documentation

## 2011-04-25 DIAGNOSIS — E1069 Type 1 diabetes mellitus with other specified complication: Secondary | ICD-10-CM | POA: Insufficient documentation

## 2011-04-25 DIAGNOSIS — M86179 Other acute osteomyelitis, unspecified ankle and foot: Secondary | ICD-10-CM

## 2011-04-25 DIAGNOSIS — I10 Essential (primary) hypertension: Secondary | ICD-10-CM | POA: Insufficient documentation

## 2011-04-25 DIAGNOSIS — M6240 Contracture of muscle, unspecified site: Secondary | ICD-10-CM | POA: Insufficient documentation

## 2011-04-25 HISTORY — PX: AMPUTATION: SHX166

## 2011-04-25 LAB — PROTIME-INR
INR: 1.01 (ref 0.00–1.49)
Prothrombin Time: 13.5 seconds (ref 11.6–15.2)

## 2011-04-25 LAB — APTT: aPTT: 32 seconds (ref 24–37)

## 2011-04-25 LAB — GLUCOSE, CAPILLARY: Glucose-Capillary: 156 mg/dL — ABNORMAL HIGH (ref 70–99)

## 2011-04-25 SURGERY — AMPUTATION DIGIT
Anesthesia: General | Site: Foot | Laterality: Left | Wound class: Dirty or Infected

## 2011-04-25 MED ORDER — FENTANYL CITRATE 0.05 MG/ML IJ SOLN
INTRAMUSCULAR | Status: DC | PRN
  Administered 2011-04-25: 50 ug via INTRAVENOUS
  Administered 2011-04-25: 100 ug via INTRAVENOUS

## 2011-04-25 MED ORDER — HYDROMORPHONE HCL PF 1 MG/ML IJ SOLN
0.2500 mg | INTRAMUSCULAR | Status: DC | PRN
  Administered 2011-04-25 (×2): 0.5 mg via INTRAVENOUS

## 2011-04-25 MED ORDER — MIDAZOLAM HCL 5 MG/5ML IJ SOLN
INTRAMUSCULAR | Status: DC | PRN
  Administered 2011-04-25: 1 mg via INTRAVENOUS

## 2011-04-25 MED ORDER — ONDANSETRON HCL 4 MG/2ML IJ SOLN: 4.0000 mg | Freq: Four times a day (QID) | INTRAMUSCULAR | Status: DC | PRN

## 2011-04-25 MED ORDER — CEFAZOLIN SODIUM 1-5 GM-% IV SOLN
INTRAVENOUS | Status: DC | PRN
  Administered 2011-04-25: 2 g via INTRAVENOUS

## 2011-04-25 MED ORDER — PROPOFOL 10 MG/ML IV EMUL
INTRAVENOUS | Status: DC | PRN
  Administered 2011-04-25: 500 mg via INTRAVENOUS

## 2011-04-25 MED ORDER — SODIUM CHLORIDE 0.9 % IR SOLN
Status: DC | PRN
  Administered 2011-04-25: 1000 mL

## 2011-04-25 MED ORDER — ONDANSETRON HCL 4 MG/2ML IJ SOLN
INTRAMUSCULAR | Status: DC | PRN
  Administered 2011-04-25: 4 mg via INTRAVENOUS

## 2011-04-25 MED ORDER — LACTATED RINGERS IV SOLN
INTRAVENOUS | Status: DC
  Administered 2011-04-25: 11:00:00 via INTRAVENOUS

## 2011-04-25 MED ORDER — OXYCODONE-ACETAMINOPHEN 5-325 MG PO TABS: 1.0000 | ORAL_TABLET | ORAL | Status: AC | PRN

## 2011-04-25 SURGICAL SUPPLY — 51 items
BANDAGE GAUZE 4  KLING STR (GAUZE/BANDAGES/DRESSINGS) IMPLANT
BANDAGE GAUZE ELAST BULKY 4 IN (GAUZE/BANDAGES/DRESSINGS) ×1 IMPLANT
BLADE AVERAGE 25X9 (BLADE) IMPLANT
BLADE MINI RND TIP GREEN BEAV (BLADE) IMPLANT
BNDG CMPR 9X4 STRL LF SNTH (GAUZE/BANDAGES/DRESSINGS)
BNDG COHESIVE 1X5 TAN STRL LF (GAUZE/BANDAGES/DRESSINGS) IMPLANT
BNDG COHESIVE 6X5 TAN NS LF (GAUZE/BANDAGES/DRESSINGS) ×1 IMPLANT
BNDG COHESIVE 6X5 TAN STRL LF (GAUZE/BANDAGES/DRESSINGS) ×1 IMPLANT
BNDG ESMARK 4X9 LF (GAUZE/BANDAGES/DRESSINGS) ×1 IMPLANT
BNDG GAUZE STRTCH 6 (GAUZE/BANDAGES/DRESSINGS) IMPLANT
CLOTH BEACON ORANGE TIMEOUT ST (SAFETY) ×2 IMPLANT
CORDS BIPOLAR (ELECTRODE) ×2 IMPLANT
COVER SURGICAL LIGHT HANDLE (MISCELLANEOUS) ×2 IMPLANT
CUFF TOURNIQUET SINGLE 18IN (TOURNIQUET CUFF) IMPLANT
CUFF TOURNIQUET SINGLE 24IN (TOURNIQUET CUFF) IMPLANT
CUFF TOURNIQUET SINGLE 34IN LL (TOURNIQUET CUFF) IMPLANT
CUFF TOURNIQUET SINGLE 44IN (TOURNIQUET CUFF) IMPLANT
DRAPE U-SHAPE 47X51 STRL (DRAPES) ×2 IMPLANT
DRSG ADAPTIC 3X8 NADH LF (GAUZE/BANDAGES/DRESSINGS) ×1 IMPLANT
DURAPREP 26ML APPLICATOR (WOUND CARE) ×2 IMPLANT
ELECT REM PT RETURN 9FT ADLT (ELECTROSURGICAL) ×2
ELECTRODE REM PT RTRN 9FT ADLT (ELECTROSURGICAL) ×1 IMPLANT
GAUZE SPONGE 2X2 8PLY STRL LF (GAUZE/BANDAGES/DRESSINGS) IMPLANT
GAUZE SPONGE 4X4 12PLY STRL LF (GAUZE/BANDAGES/DRESSINGS) ×1 IMPLANT
GLOVE BIOGEL PI IND STRL 9 (GLOVE) ×1 IMPLANT
GLOVE BIOGEL PI INDICATOR 9 (GLOVE) ×1
GLOVE SURG ORTHO 9.0 STRL STRW (GLOVE) ×2 IMPLANT
GOWN PREVENTION PLUS XLARGE (GOWN DISPOSABLE) ×2 IMPLANT
GOWN SRG XL XLNG 56XLVL 4 (GOWN DISPOSABLE) ×1 IMPLANT
GOWN STRL NON-REIN XL XLG LVL4 (GOWN DISPOSABLE) ×2
KIT BASIN OR (CUSTOM PROCEDURE TRAY) ×2 IMPLANT
KIT ROOM TURNOVER OR (KITS) ×2 IMPLANT
MANIFOLD NEPTUNE II (INSTRUMENTS) ×2 IMPLANT
NDL HYPO 25GX1X1/2 BEV (NEEDLE) IMPLANT
NEEDLE HYPO 25GX1X1/2 BEV (NEEDLE) IMPLANT
NS IRRIG 1000ML POUR BTL (IV SOLUTION) ×2 IMPLANT
PACK ORTHO EXTREMITY (CUSTOM PROCEDURE TRAY) ×2 IMPLANT
PAD ARMBOARD 7.5X6 YLW CONV (MISCELLANEOUS) ×2 IMPLANT
PAD CAST 4YDX4 CTTN HI CHSV (CAST SUPPLIES) IMPLANT
PADDING CAST COTTON 4X4 STRL (CAST SUPPLIES)
SPECIMEN JAR SMALL (MISCELLANEOUS) ×2 IMPLANT
SPONGE GAUZE 2X2 STER 10/PKG (GAUZE/BANDAGES/DRESSINGS)
SPONGE GAUZE 4X4 12PLY (GAUZE/BANDAGES/DRESSINGS) IMPLANT
SUCTION FRAZIER TIP 10 FR DISP (SUCTIONS) IMPLANT
SUT ETHILON 2 0 PSLX (SUTURE) IMPLANT
SUT VIC AB 2-0 FS1 27 (SUTURE) IMPLANT
SYR CONTROL 10ML LL (SYRINGE) IMPLANT
TOWEL OR 17X24 6PK STRL BLUE (TOWEL DISPOSABLE) ×2 IMPLANT
TOWEL OR 17X26 10 PK STRL BLUE (TOWEL DISPOSABLE) ×2 IMPLANT
TUBE CONNECTING 12X1/4 (SUCTIONS) IMPLANT
WATER STERILE IRR 1000ML POUR (IV SOLUTION) ×2 IMPLANT

## 2011-04-25 NOTE — Anesthesia Postprocedure Evaluation (Signed)
Anesthesia Post Note  Patient: [name]  Procedure(s) Performed:  AMPUTATION DIGIT - Left foot 3rd toe amputation MTP joint, Gastroc Recession  Achilles Lengthening   Anesthesia type: General  Patient location: PACU  Post pain: Pain level controlled and Adequate analgesia  Post assessment: Post-op Vital signs reviewed, Patient's Cardiovascular Status Stable, Respiratory Function Stable, Patent Airway and Pain level controlled  Last Vitals:  Filed Vitals:   04/25/11 1342  BP:   Pulse: 69  Temp:   Resp: 13    Post vital signs: Reviewed and stable  Level of consciousness: awake, alert  and oriented  Complications: No apparent anesthesia complications

## 2011-04-25 NOTE — Preoperative (Signed)
Beta Blockers   Reason not to administer Beta Blockers:Not Applicable 

## 2011-04-25 NOTE — Anesthesia Procedure Notes (Signed)
Procedure Name: LMA Insertion Date/Time: 04/25/2011 12:10 PM Performed by: Glendora Score Pre-anesthesia Checklist: Patient identified, Emergency Drugs available, Suction available and Patient being monitored Patient Re-evaluated:Patient Re-evaluated prior to inductionOxygen Delivery Method: Circle System Utilized Preoxygenation: Pre-oxygenation with 100% oxygen Intubation Type: IV induction Ventilation: Mask ventilation without difficulty LMA: LMA with gastric port inserted LMA Size: 5.0 Number of attempts: 2 Placement Confirmation: positive ETCO2 and breath sounds checked- equal and bilateral Tube secured with: Tape Dental Injury: Teeth and Oropharynx as per pre-operative assessment

## 2011-04-25 NOTE — Anesthesia Preprocedure Evaluation (Addendum)
Anesthesia Evaluation  Patient identified by MRN, date of birth, ID band Patient awake    Reviewed: Allergy & Precautions, H&P , NPO status , Patient's Chart, lab work & pertinent test results  Airway Mallampati: II  Neck ROM: full    Dental   Pulmonary          Cardiovascular hypertension,     Neuro/Psych    GI/Hepatic   Endo/Other  Diabetes mellitus-, Type 1  Renal/GU      Musculoskeletal   Abdominal   Peds  Hematology   Anesthesia Other Findings   Reproductive/Obstetrics                          Anesthesia Physical Anesthesia Plan  ASA: II  Anesthesia Plan: General   Post-op Pain Management:    Induction:   Airway Management Planned: LMA  Additional Equipment:   Intra-op Plan:   Post-operative Plan:   Informed Consent: I have reviewed the patients History and Physical, chart, labs and discussed the procedure including the risks, benefits and alternatives for the proposed anesthesia with the patient or authorized representative who has indicated his/her understanding and acceptance.   Dental advisory given  Plan Discussed with: CRNA, Surgeon and Anesthesiologist  Anesthesia Plan Comments:        Anesthesia Quick Evaluation

## 2011-04-25 NOTE — Transfer of Care (Signed)
Immediate Anesthesia Transfer of Care Note  Patient: [name]  Procedure(s) Performed:  AMPUTATION DIGIT - Left foot 3rd toe amputation MTP joint, Gastroc Recession  Achilles Lengthening   Patient Location: PACU  Anesthesia Type: General  Level of Consciousness: sedated  Airway & Oxygen Therapy: Patient Spontanous Breathing and Patient connected to face mask oxygen  Post-op Assessment: Report given to PACU RN and Post -op Vital signs reviewed and stable  Post vital signs: Reviewed and stable  Complications: No apparent anesthesia complications

## 2011-04-25 NOTE — H&P (Signed)
[name] is an 35 y.o. male.   Chief Complaint: Osteomyelitis and ulcer left third toe HPI: Patient presents with chronic heel CORD contracture with diabetic insensate neuropathy with ulceration and osteomyelitis of the third toe. Patient has failed conservative treatment with activity modifications pressure unloading and wound care dressings as well as antibiotics.  Past Medical History  Diagnosis Date  . Hypertension   . Diabetes mellitus     type 2 iddm x 18 yrs  . Vascular disease     poor circulation to left foot    Past Surgical History  Procedure Date  . Cholecystectomy   . Toe amputation 2012    left foot; great toe and second toe    History reviewed. No pertinent family history. Social History:  reports that he has been smoking Cigarettes.  He has a .25 pack-year smoking history. He has never used smokeless tobacco. He reports that he uses illicit drugs (Marijuana) about 10 times per week. His alcohol history not on file.  Allergies: No Known Allergies  Medications Prior to Admission  Medication Dose Route Frequency Provider Last Rate Last Dose  . ceFAZolin (ANCEF) IVPB 2 g/50 mL premix  2 g Intravenous 60 min Pre-Op Nadara Mustard, MD       Medications Prior to Admission  Medication Sig Dispense Refill  . glipiZIDE (GLUCOTROL) 10 MG tablet Take 10 mg by mouth daily.        . Insulin Detemir (LEVEMIR FLEXPEN [place]) Inject 20 Units into the skin at bedtime as needed. Based on CBGs      . lisinopril (PRINIVIL,ZESTRIL) 20 MG tablet Take 20 mg by mouth daily.        . metFORMIN (GLUCOPHAGE) 1000 MG tablet Take 500 mg by mouth 2 (two) times daily.         Results for orders placed during the hospital encounter of 04/25/11 (from the past 48 hour(s))  HEPATIC FUNCTION PANEL     Status: Abnormal   Collection Time   04/24/11 12:19 PM      Component Value Range Comment   Total Protein 8.4 (*) 6.0 - 8.3 (g/dL)    Albumin 3.4 (*) 3.5 - 5.2 (g/dL)    AST 15  0 - 37 (U/L)    ALT 20  0 - 53 (U/L)    Alkaline Phosphatase 88  39 - 117 (U/L)    Total Bilirubin 0.2 (*) 0.3 - 1.2 (mg/dL)    Bilirubin, Direct <1.9  0.0 - 0.3 (mg/dL)    Indirect Bilirubin NOT CALCULATED  0.3 - 0.9 (mg/dL)   APTT     Status: Normal   Collection Time   04/25/11  9:44 AM      Component Value Range Comment   aPTT 32  24 - 37 (seconds)   PROTIME-INR     Status: Normal   Collection Time   04/25/11  9:44 AM      Component Value Range Comment   Prothrombin Time 13.5  11.6 - 15.2 (seconds)    INR 1.01  0.00 - 1.49    GLUCOSE, CAPILLARY     Status: Abnormal   Collection Time   04/25/11  9:51 AM      Component Value Range Comment   Glucose-Capillary 156 (*) 70 - 99 (mg/dL)    No results found.  ROS  Blood pressure 136/88, pulse 76, temperature 98.2 F (36.8 C), temperature source Oral, resp. rate 20, weight 102.6 kg (226 lb 3.1 oz), SpO2 100.00%. Physical  Exam  On examination patient is heel cord contracture with dorsiflexion of the ankle only to neutral. He has palpable pulses. He has the ulcer osteomyelitis and a wound that probes to bone and the left third toe. Assessment/Plan Osteomyelitis and Wagner grade 3 ulcer left third toe. Heel cord contracture. Plan Will plan for a gastrocnemius recession and amputation of the third toe through the MTP joint.  [name],[name] 04/25/2011, 11:19 AM

## 2011-04-25 NOTE — Op Note (Signed)
04/25/2011  12:50 PM  PATIENT:  [name]  35 y.o. male  PRE-OPERATIVE DIAGNOSIS:  Left foot 3rd toe osteomyelitis, heel cord contracture  POST-OPERATIVE DIAGNOSIS: Same   PROCEDURE:  Procedure(s): Amputation left foot third toe MTP joint Gastrocnemius recession left leg AMPUTATION DIGIT  SURGEON:  Surgeon(s): Nadara Mustard, MD      ANESTHESIA:   general  EBL:            SPECIMEN:  Source of Specimen:  Left foot third toe  DISPOSITION OF SPECIMEN:  PATHOLOGY    TOURNIQUET:  * No tourniquets in log *  DICTATION: .Dragon Dictation patient was brought to OR room 3 and underwent a general anesthetic. After adequate levels of anesthesia were obtained patient's left lower extremity was prepped using DuraPrep and draped in a sterile field. A fishmouth incision was made at the third toe MTP joint the toe was amputated through the MTP joint the wound is irrigated and closed with 3-0 nylon. Attention was then focused at the medial aspect of left calf. A longitudinal incision was made medially approximately 3 cm in length this was bluntly carried down to the gastrocnemius fascia. The fascia was released the patient's dorsiflexion went from 10 short of neutral to 30 past neutral. The wound is irrigated with normal saline. The wound was closed using 2-0 nylon. The wound was covered with Adaptic orthopedic sponges Kerlix and Coban. Patient was extubated taken the PACU in stable condition.  PLAN OF CARE: Discharge to home after PACU  PATIENT DISPOSITION:  PACU - hemodynamically stable.   Delay start of Pharmacological VTE agent (>24hrs) due to surgical blood loss or risk of bleeding:  no

## 2011-04-25 NOTE — Progress Notes (Signed)
Dr Chaney Malling notified that pt drank one cup of water at 0800.

## 2011-04-29 ENCOUNTER — Encounter (HOSPITAL_COMMUNITY): Payer: Self-pay | Admitting: Orthopedic Surgery

## 2011-06-15 ENCOUNTER — Emergency Department (HOSPITAL_COMMUNITY)
Admission: EM | Admit: 2011-06-15 | Discharge: 2011-06-15 | Disposition: A | Discharge status: Home / Self Care | Payer: Self-pay | Attending: Emergency Medicine | Admitting: Emergency Medicine

## 2011-06-15 ENCOUNTER — Encounter (HOSPITAL_COMMUNITY): Payer: Self-pay | Admitting: *Deleted

## 2011-06-15 ENCOUNTER — Emergency Department (HOSPITAL_COMMUNITY): Payer: Self-pay

## 2011-06-15 DIAGNOSIS — R059 Cough, unspecified: Secondary | ICD-10-CM | POA: Insufficient documentation

## 2011-06-15 DIAGNOSIS — Z79899 Other long term (current) drug therapy: Secondary | ICD-10-CM | POA: Insufficient documentation

## 2011-06-15 DIAGNOSIS — R05 Cough: Secondary | ICD-10-CM | POA: Insufficient documentation

## 2011-06-15 DIAGNOSIS — N390 Urinary tract infection, site not specified: Secondary | ICD-10-CM | POA: Insufficient documentation

## 2011-06-15 DIAGNOSIS — R1013 Epigastric pain: Secondary | ICD-10-CM | POA: Insufficient documentation

## 2011-06-15 DIAGNOSIS — R112 Nausea with vomiting, unspecified: Secondary | ICD-10-CM | POA: Insufficient documentation

## 2011-06-15 DIAGNOSIS — I10 Essential (primary) hypertension: Secondary | ICD-10-CM | POA: Insufficient documentation

## 2011-06-15 DIAGNOSIS — E119 Type 2 diabetes mellitus without complications: Secondary | ICD-10-CM | POA: Insufficient documentation

## 2011-06-15 DIAGNOSIS — R5381 Other malaise: Secondary | ICD-10-CM | POA: Insufficient documentation

## 2011-06-15 DIAGNOSIS — M2550 Pain in unspecified joint: Secondary | ICD-10-CM | POA: Insufficient documentation

## 2011-06-15 DIAGNOSIS — IMO0001 Reserved for inherently not codable concepts without codable children: Secondary | ICD-10-CM | POA: Insufficient documentation

## 2011-06-15 LAB — COMPREHENSIVE METABOLIC PANEL
Albumin: 3.5 g/dL (ref 3.5–5.2)
Alkaline Phosphatase: 76 U/L (ref 39–117)
BUN: 22 mg/dL (ref 6–23)
Calcium: 10 mg/dL (ref 8.4–10.5)
Creatinine, Ser: 1.09 mg/dL (ref 0.50–1.35)
Potassium: 4.6 mEq/L (ref 3.5–5.1)
Total Protein: 8.5 g/dL — ABNORMAL HIGH (ref 6.0–8.3)

## 2011-06-15 LAB — URINALYSIS, ROUTINE W REFLEX MICROSCOPIC
Leukocytes, UA: NEGATIVE
Nitrite: NEGATIVE
Specific Gravity, Urine: 1.02 (ref 1.005–1.030)
pH: 7.5 (ref 5.0–8.0)

## 2011-06-15 LAB — URINE MICROSCOPIC-ADD ON

## 2011-06-15 LAB — DIFFERENTIAL
Basophils Relative: 0 % (ref 0–1)
Eosinophils Absolute: 0.2 10*3/uL (ref 0.0–0.7)
Monocytes Relative: 5 % (ref 3–12)
Neutrophils Relative %: 66 % (ref 43–77)

## 2011-06-15 LAB — CBC
Hemoglobin: 11.5 g/dL — ABNORMAL LOW (ref 13.0–17.0)
MCH: 30.4 pg (ref 26.0–34.0)
MCHC: 34.6 g/dL (ref 30.0–36.0)

## 2011-06-15 LAB — POCT I-STAT 3, VENOUS BLOOD GAS (G3P V): Acid-Base Excess: 3 mmol/L — ABNORMAL HIGH (ref 0.0–2.0)

## 2011-06-15 LAB — GLUCOSE, CAPILLARY: Glucose-Capillary: 98 mg/dL (ref 70–99)

## 2011-06-15 LAB — LIPASE, BLOOD: Lipase: 25 U/L (ref 11–59)

## 2011-06-15 MED ORDER — ONDANSETRON HCL 4 MG/2ML IJ SOLN
4.0000 mg | Freq: Once | INTRAMUSCULAR | Status: AC
  Administered 2011-06-15: 4 mg via INTRAVENOUS
  Filled 2011-06-15: qty 2

## 2011-06-15 MED ORDER — SODIUM CHLORIDE 0.9 % IV SOLN
INTRAVENOUS | Status: DC
  Administered 2011-06-15: 1000 mL via INTRAVENOUS

## 2011-06-15 MED ORDER — DEXTROSE 5 % IV SOLN
1.0000 g | Freq: Once | INTRAVENOUS | Status: AC
  Administered 2011-06-15: 1 g via INTRAVENOUS
  Filled 2011-06-15: qty 10

## 2011-06-15 MED ORDER — CEPHALEXIN 500 MG PO CAPS: ORAL_CAPSULE | ORAL | Status: AC

## 2011-06-15 MED ORDER — PANTOPRAZOLE SODIUM 40 MG IV SOLR
40.0000 mg | Freq: Once | INTRAVENOUS | Status: AC
  Administered 2011-06-15: 40 mg via INTRAVENOUS
  Filled 2011-06-15: qty 40

## 2011-06-15 MED ORDER — HYDROMORPHONE HCL PF 1 MG/ML IJ SOLN
1.0000 mg | Freq: Once | INTRAMUSCULAR | Status: AC
  Administered 2011-06-15: 1 mg via INTRAVENOUS
  Filled 2011-06-15: qty 1

## 2011-06-15 MED ORDER — METOCLOPRAMIDE HCL 5 MG/ML IJ SOLN
10.0000 mg | Freq: Once | INTRAMUSCULAR | Status: AC
  Filled 2011-06-15: qty 2

## 2011-06-15 MED ORDER — ONDANSETRON 8 MG PO TBDP: ORAL_TABLET | ORAL | Status: AC

## 2011-06-15 MED ORDER — METOCLOPRAMIDE HCL 10 MG PO TABS: 10.0000 mg | ORAL_TABLET | Freq: Four times a day (QID) | ORAL | Status: DC | PRN

## 2011-06-15 MED ORDER — FAMOTIDINE 20 MG PO TABS: 20.0000 mg | ORAL_TABLET | Freq: Two times a day (BID) | ORAL | Status: DC

## 2011-06-15 MED ORDER — SODIUM CHLORIDE 0.9 % IV BOLUS (SEPSIS)
2000.0000 mL | Freq: Once | INTRAVENOUS | Status: AC
  Administered 2011-06-15: 2000 mL via INTRAVENOUS

## 2011-06-15 MED ORDER — OXYCODONE-ACETAMINOPHEN 5-325 MG PO TABS: 2.0000 | ORAL_TABLET | ORAL | Status: AC | PRN

## 2011-06-15 MED ORDER — FAMOTIDINE IN NACL 20-0.9 MG/50ML-% IV SOLN
20.0000 mg | Freq: Once | INTRAVENOUS | Status: AC
  Administered 2011-06-15: 20 mg via INTRAVENOUS
  Filled 2011-06-15: qty 50

## 2011-06-15 NOTE — ED Notes (Signed)
CBG 98  

## 2011-06-15 NOTE — ED Notes (Signed)
Reports mid abd pain that started 4 days ago but has become progressively worse, denies any n/v/d.

## 2011-06-15 NOTE — ED Provider Notes (Signed)
History     CSN: [id number]  Arrival date & time 06/15/11  1427   First MD Initiated Contact with Patient 06/15/11 1638      Chief Complaint  Patient presents with  . Abdominal Pain    (Consider location/radiation/quality/duration/timing/severity/associated sxs/prior treatment) HPI This 36 year old male has a history of diabetes with neuropathy and he has also had prior cholecystectomy who now presents with 2-3 days of cough, general body aches, generalized fatigue, nausea, upper abdominal epigastric pain which is nonradiating, and now today developed several episodes of nonbloody vomiting. His abdominal pain is nonradiating it is constant aching and sore moderately severe with associated nausea and vomiting without dysuria or frequency of urination without diarrhea or bloody stools. He has no shortness of breath no confusion no severe headache no stiff neck no rash. There's been no treatment at home prior to arrival. Past Medical History  Diagnosis Date  . Hypertension   . Diabetes mellitus     type 2 iddm x 18 yrs  . Vascular disease     poor circulation to left foot    Past Surgical History  Procedure Date  . Cholecystectomy   . Toe amputation 2012    left foot; great toe and second toe  . Amputation 04/15/2011    Procedure: AMPUTATION RAY;  Surgeon: Nadara Mustard, MD;  Location: [facility] OR;  Service: Orthopedics;  Laterality: Left;  left foot third toe amputation and MPP joint and gastroc resection VS. achilles lengthing   . Amputation 04/25/2011    Procedure: AMPUTATION DIGIT;  Surgeon: Nadara Mustard, MD;  Location: [facility] OR;  Service: Orthopedics;  Laterality: Left;  Left foot 3rd toe amputation MTP joint, Gastroc Recession  Achilles Lengthening     History reviewed. No pertinent family history.  History  Substance Use Topics  . Smoking status: Current Some Day Smoker -- 0.2 packs/day for 1 years    Types: Cigarettes  . Smokeless tobacco: Never Used  . Alcohol Use: No       Review of Systems  Constitutional: Negative for fever.       10 Systems reviewed and are negative for acute change except as noted in the HPI.  HENT: Negative for congestion.   Eyes: Negative for discharge and redness.  Respiratory: Positive for cough. Negative for shortness of breath.   Cardiovascular: Negative for chest pain.  Gastrointestinal: Positive for nausea, vomiting and abdominal pain. Negative for diarrhea and blood in stool.  Genitourinary: Negative for dysuria.  Musculoskeletal: Positive for myalgias and arthralgias. Negative for back pain.  Skin: Negative for rash.  Neurological: Negative for syncope, numbness and headaches.  Psychiatric/Behavioral:       No behavior change.    Allergies  Review of patient's allergies indicates no known allergies.  Home Medications   Current Outpatient Rx  Name Route Sig Dispense Refill  . GLIPIZIDE 10 MG PO TABS Oral Take 10 mg by mouth daily.      [name] LEVEMIR FLEXPEN [place] Subcutaneous Inject 20 Units into the skin at bedtime as needed. Based on CBGs    . LISINOPRIL 20 MG PO TABS Oral Take 20 mg by mouth daily.      [name] METFORMIN HCL 1000 MG PO TABS Oral Take 500 mg by mouth 2 (two) times daily.     . CEPHALEXIN 500 MG PO CAPS  2 caps po bid x 7 days 28 capsule 0  . FAMOTIDINE 20 MG PO TABS Oral Take 1 tablet (20 mg total) by  mouth 2 (two) times daily. 10 tablet 0  . METOCLOPRAMIDE HCL 10 MG PO TABS Oral Take 1 tablet (10 mg total) by mouth every 6 (six) hours as needed (nausea/headache). 6 tablet 0  . ONDANSETRON 8 MG PO TBDP  8mg  ODT q4 hours prn nausea 4 tablet 0  . OXYCODONE-ACETAMINOPHEN 5-325 MG PO TABS Oral Take 2 tablets by mouth every 4 (four) hours as needed for pain. 20 tablet 0    BP 148/89  Pulse 79  Temp(Src) 98.4 F (36.9 C) (Oral)  Resp 18  SpO2 100%  Physical Exam  Nursing note and vitals reviewed. Constitutional:       Awake, alert, nontoxic appearance.  HENT:  Head: Atraumatic.  Mouth/Throat: No  oropharyngeal exudate.  Eyes: Right eye exhibits no discharge. Left eye exhibits no discharge.  Neck: Neck supple.  Cardiovascular: Regular rhythm.   No murmur heard. Pulmonary/Chest: Effort normal and breath sounds normal. No respiratory distress. He has no wheezes. He has no rales. He exhibits no tenderness.  Abdominal: Soft. Bowel sounds are normal. He exhibits no mass. There is tenderness. There is no rebound and no guarding.       Mild to moderate epigastric and left upper quadrant tenderness only with right upper quadrant nontender in the entire lower abdomen is nontender with no rebound and no CVA tenderness  Musculoskeletal: He exhibits no tenderness.       Baseline ROM, no obvious new focal weakness.  Neurological:       Mental status and motor strength appears baseline for patient and situation.  Skin: No rash noted.  Psychiatric: He has a normal mood and affect.    ED Course  Procedures (including critical care time) Patient feels much better, abdomen soft and nontender, patient denies flank pain, patient's pain is not colicky, patient's presentation is not typical for renal colic, since the patient feels much better we will place him on antibiotics for possible urine infection, I will add a urine culture, he appears stable for close follow up with his doctor in one to 2 days and he understands and agrees with this assessment and plan as well. Labs Reviewed  CBC - Abnormal; Notable for the following:    RBC 3.78 (*)    Hemoglobin 11.5 (*)    HCT 33.2 (*)    All other components within normal limits  COMPREHENSIVE METABOLIC PANEL - Abnormal; Notable for the following:    Glucose, Bld 103 (*)    Total Protein 8.5 (*)    Total Bilirubin 0.2 (*)    GFR calc non Af Amer 87 (*)    All other components within normal limits  URINALYSIS, ROUTINE W REFLEX MICROSCOPIC - Abnormal; Notable for the following:    Hgb urine dipstick LARGE (*)    Protein, ur >300 (*)    All other  components within normal limits  POCT I-STAT 3, BLOOD GAS (G3P V) - Abnormal; Notable for the following:    pH, Ven 7.384 (*)    Bicarbonate 29.1 (*)    Acid-Base Excess 3.0 (*)    All other components within normal limits  DIFFERENTIAL  LIPASE, BLOOD  GLUCOSE, CAPILLARY  URINE MICROSCOPIC-ADD ON  POCT CBG MONITORING  URINE CULTURE   Dg Chest 2 View  06/15/2011  *RADIOLOGY REPORT*  Clinical Data: Cough, chest congestion, chills, vomiting, right upper quadrant abdominal pain, smoker.  CHEST - 2 VIEW  Comparison: 12/22/2010.  Findings: Poor inspiration.  Grossly stable normal sized heart. Clear lungs with  normal vascularity.  Interval cholecystectomy clips.  Unremarkable bones.  IMPRESSION: No acute abnormality.  Original Report Authenticated By: Darrol Angel, [name]     1. Abdominal pain   2. Urinary tract infection       MDM  I doubt any other EMC precluding discharge at this time including, but not necessarily limited to the following:sepsis, peritonitis.        Hurman Horn, MD 06/15/11 2146

## 2011-06-15 NOTE — ED Notes (Signed)
Pt states understanding of discharge instructions 

## 2011-06-15 NOTE — ED Notes (Signed)
Pt actively vomiting. IV started and Dr. Fonnie Jarvis notified of pt.

## 2011-08-03 ENCOUNTER — Emergency Department (HOSPITAL_COMMUNITY)
Admission: EM | Admit: 2011-08-03 | Discharge: 2011-08-03 | Disposition: A | Discharge status: Home / Self Care | Payer: Self-pay | Attending: Emergency Medicine | Admitting: Emergency Medicine

## 2011-08-03 ENCOUNTER — Encounter (HOSPITAL_COMMUNITY): Payer: Self-pay | Admitting: Emergency Medicine

## 2011-08-03 DIAGNOSIS — I10 Essential (primary) hypertension: Secondary | ICD-10-CM | POA: Insufficient documentation

## 2011-08-03 DIAGNOSIS — R112 Nausea with vomiting, unspecified: Secondary | ICD-10-CM | POA: Insufficient documentation

## 2011-08-03 DIAGNOSIS — E119 Type 2 diabetes mellitus without complications: Secondary | ICD-10-CM | POA: Insufficient documentation

## 2011-08-03 DIAGNOSIS — F17200 Nicotine dependence, unspecified, uncomplicated: Secondary | ICD-10-CM | POA: Insufficient documentation

## 2011-08-03 DIAGNOSIS — R197 Diarrhea, unspecified: Secondary | ICD-10-CM | POA: Insufficient documentation

## 2011-08-03 LAB — COMPREHENSIVE METABOLIC PANEL
ALT: 38 U/L (ref 0–53)
AST: 25 U/L (ref 0–37)
Albumin: 3.2 g/dL — ABNORMAL LOW (ref 3.5–5.2)
Alkaline Phosphatase: 81 U/L (ref 39–117)
BUN: 17 mg/dL (ref 6–23)
Chloride: 108 mEq/L (ref 96–112)
Potassium: 3.7 mEq/L (ref 3.5–5.1)
Sodium: 141 mEq/L (ref 135–145)
Total Bilirubin: 0.2 mg/dL — ABNORMAL LOW (ref 0.3–1.2)
Total Protein: 7.7 g/dL (ref 6.0–8.3)

## 2011-08-03 LAB — URINE MICROSCOPIC-ADD ON

## 2011-08-03 LAB — URINALYSIS, ROUTINE W REFLEX MICROSCOPIC
Bilirubin Urine: NEGATIVE
Glucose, UA: NEGATIVE mg/dL
Ketones, ur: NEGATIVE mg/dL
Specific Gravity, Urine: 1.025 (ref 1.005–1.030)
pH: 5.5 (ref 5.0–8.0)

## 2011-08-03 LAB — LIPASE, BLOOD: Lipase: 25 U/L (ref 11–59)

## 2011-08-03 LAB — DIFFERENTIAL
Basophils Relative: 0 % (ref 0–1)
Monocytes Relative: 5 % (ref 3–12)
Neutro Abs: 6.7 10*3/uL (ref 1.7–7.7)
Neutrophils Relative %: 72 % (ref 43–77)

## 2011-08-03 LAB — CBC
Hemoglobin: 10.7 g/dL — ABNORMAL LOW (ref 13.0–17.0)
MCHC: 33.6 g/dL (ref 30.0–36.0)
Platelets: 217 10*3/uL (ref 150–400)
RBC: 3.6 MIL/uL — ABNORMAL LOW (ref 4.22–5.81)

## 2011-08-03 MED ORDER — MORPHINE SULFATE 4 MG/ML IJ SOLN
4.0000 mg | Freq: Once | INTRAMUSCULAR | Status: AC
  Administered 2011-08-03: 4 mg via INTRAVENOUS
  Filled 2011-08-03: qty 1

## 2011-08-03 MED ORDER — KETOROLAC TROMETHAMINE 30 MG/ML IJ SOLN
30.0000 mg | Freq: Once | INTRAMUSCULAR | Status: AC
  Administered 2011-08-03: 30 mg via INTRAVENOUS
  Filled 2011-08-03: qty 1

## 2011-08-03 MED ORDER — ONDANSETRON HCL 4 MG/2ML IJ SOLN
4.0000 mg | Freq: Once | INTRAMUSCULAR | Status: AC
  Administered 2011-08-03: 4 mg via INTRAVENOUS
  Filled 2011-08-03: qty 2

## 2011-08-03 MED ORDER — ONDANSETRON HCL 4 MG PO TABS: 4.0000 mg | ORAL_TABLET | Freq: Four times a day (QID) | ORAL | Status: AC

## 2011-08-03 MED ORDER — HYDROCODONE-ACETAMINOPHEN 5-500 MG PO TABS: 1.0000 | ORAL_TABLET | Freq: Four times a day (QID) | ORAL | Status: AC | PRN

## 2011-08-03 MED ORDER — SODIUM CHLORIDE 0.9 % IV BOLUS (SEPSIS)
1000.0000 mL | Freq: Once | INTRAVENOUS | Status: AC
  Administered 2011-08-03: 1000 mL via INTRAVENOUS

## 2011-08-03 NOTE — Discharge Instructions (Signed)
Nausea and Vomiting Nausea is a sick feeling that often comes before throwing up (vomiting). Vomiting is a reflex where stomach contents come out of your mouth. Vomiting can cause severe loss of body fluids (dehydration). Children and elderly adults can become dehydrated quickly, especially if they also have diarrhea. Nausea and vomiting are symptoms of a condition or disease. It is important to find the cause of your symptoms. CAUSES   Direct irritation of the stomach lining. This irritation can result from increased acid production (gastroesophageal reflux disease), infection, food poisoning, taking certain medicines (such as nonsteroidal anti-inflammatory drugs), alcohol use, or tobacco use.   Signals from the brain.These signals could be caused by a headache, heat exposure, an inner ear disturbance, increased pressure in the brain from injury, infection, a tumor, or a concussion, pain, emotional stimulus, or metabolic problems.   An obstruction in the gastrointestinal tract (bowel obstruction).   Illnesses such as diabetes, hepatitis, gallbladder problems, appendicitis, kidney problems, cancer, sepsis, atypical symptoms of a heart attack, or eating disorders.   Medical treatments such as chemotherapy and radiation.   Receiving medicine that makes you sleep (general anesthetic) during surgery.  DIAGNOSIS Your caregiver may ask for tests to be done if the problems do not improve after a few days. Tests may also be done if symptoms are severe or if the reason for the nausea and vomiting is not clear. Tests may include:  Urine tests.   Blood tests.   Stool tests.   Cultures (to look for evidence of infection).   X-rays or other imaging studies.  Test results can help your caregiver make decisions about treatment or the need for additional tests. TREATMENT You need to stay well hydrated. Drink frequently but in small amounts.You may wish to drink water, sports drinks, clear broth, or  eat frozen ice pops or gelatin dessert to help stay hydrated.When you eat, eating slowly may help prevent nausea.There are also some antinausea medicines that may help prevent nausea. HOME CARE INSTRUCTIONS   Take all medicine as directed by your caregiver.   If you do not have an appetite, do not force yourself to eat. However, you must continue to drink fluids.   If you have an appetite, eat a normal diet unless your caregiver tells you differently.   Eat a variety of complex carbohydrates (rice, wheat, potatoes, bread), lean meats, yogurt, fruits, and vegetables.   Avoid high-fat foods because they are more difficult to digest.   Drink enough water and fluids to keep your urine clear or pale yellow.   If you are dehydrated, ask your caregiver for specific rehydration instructions. Signs of dehydration may include:   Severe thirst.   Dry lips and mouth.   Dizziness.   Dark urine.   Decreasing urine frequency and amount.   Confusion.   Rapid breathing or pulse.  SEEK IMMEDIATE MEDICAL CARE IF:   You have blood or brown flecks (like coffee grounds) in your vomit.   You have black or bloody stools.   You have a severe headache or stiff neck.   You are confused.   You have severe abdominal pain.   You have chest pain or trouble breathing.   You do not urinate at least once every 8 hours.   You develop cold or clammy skin.   You continue to vomit for longer than 24 to 48 hours.   You have a fever.  MAKE SURE YOU:   Understand these instructions.   Will watch your  condition.   Will get help right away if you are not doing well or get worse.  Document Released: 05/26/2005 Document Revised: 02/05/2011 Document Reviewed: 10/23/2010 [employer] Patient Information 2012 [place], [place].  This is secondary to a viral illness or your metformin. Stop the metformin and only take it if you notice your blood sugar increasing. Followup as scheduled with your primary  doctor

## 2011-08-03 NOTE — ED Notes (Signed)
[id number]  Rn notified [address]

## 2011-08-03 NOTE — ED Provider Notes (Signed)
History     CSN: [id number]  Arrival date & time 08/03/11  1648   First MD Initiated Contact with Patient 08/03/11 1853      Chief Complaint  Patient presents with  . Emesis    (Consider location/radiation/quality/duration/timing/severity/associated sxs/prior treatment) HPI Comments: Patient states that he began vomiting after resuming his metformin. States he's had these symptoms on and off since being on the informant. States when not taking it he had no emesis.  Patient is a 36 y.o. male presenting with vomiting. The history is provided by the patient. No language interpreter was used.  Emesis  This is a new problem. The current episode started more than 2 days ago (3 days ago). The problem occurs 2 to 4 times per day. The problem has been gradually worsening. The emesis has an appearance of stomach contents. There has been no fever. Associated symptoms include abdominal pain and diarrhea. Pertinent negatives include no arthralgias, no chills, no cough, no fever, no headaches and no myalgias.    Past Medical History  Diagnosis Date  . Hypertension   . Diabetes mellitus     type 2 iddm x 18 yrs  . Vascular disease     poor circulation to left foot    Past Surgical History  Procedure Date  . Cholecystectomy   . Toe amputation 2012    left foot; great toe and second toe  . Amputation 04/15/2011    Procedure: AMPUTATION RAY;  Surgeon: Nadara Mustard, MD;  Location: [facility] OR;  Service: Orthopedics;  Laterality: Left;  left foot third toe amputation and MPP joint and gastroc resection VS. achilles lengthing   . Amputation 04/25/2011    Procedure: AMPUTATION DIGIT;  Surgeon: Nadara Mustard, MD;  Location: [facility] OR;  Service: Orthopedics;  Laterality: Left;  Left foot 3rd toe amputation MTP joint, Gastroc Recession  Achilles Lengthening     No family history on file.  History  Substance Use Topics  . Smoking status: Current Everyday Smoker -- 0.2 packs/day for 1 years    Types:  Cigarettes  . Smokeless tobacco: Never Used  . Alcohol Use: No      Review of Systems  Constitutional: Positive for fatigue. Negative for fever, chills, activity change and appetite change.  HENT: Negative for congestion, sore throat, rhinorrhea, neck pain and neck stiffness.   Respiratory: Negative for cough and shortness of breath.   Cardiovascular: Negative for chest pain and palpitations.  Gastrointestinal: Positive for nausea, vomiting, abdominal pain and diarrhea.  Genitourinary: Negative for dysuria, urgency, frequency and flank pain.  Musculoskeletal: Negative for myalgias, back pain and arthralgias.  Neurological: Negative for dizziness, weakness, light-headedness, numbness and headaches.  All other systems reviewed and are negative.    Allergies  Review of patient's allergies indicates no known allergies.  Home Medications   Current Outpatient Rx  Name Route Sig Dispense Refill  . GLIPIZIDE 10 MG PO TABS Oral Take 10 mg by mouth daily.     [name] LEVEMIR FLEXPEN [place] Subcutaneous Inject 20 Units into the skin at bedtime as needed. Based on CBGs    . LISINOPRIL 20 MG PO TABS Oral Take 20 mg by mouth daily.     [name] METFORMIN HCL 1000 MG PO TABS Oral Take 500 mg by mouth 2 (two) times daily.     [name] HYDROCODONE-ACETAMINOPHEN 5-500 MG PO TABS Oral Take 1-2 tablets by mouth every 6 (six) hours as needed for pain. 6 tablet 0  . ONDANSETRON HCL 4  MG PO TABS Oral Take 1 tablet (4 mg total) by mouth every 6 (six) hours. 12 tablet 0    BP 136/92  Pulse 88  Temp(Src) 98.1 F (36.7 C) (Oral)  Resp 22  SpO2 100%  Physical Exam  Nursing note and vitals reviewed. Constitutional: He is oriented to person, place, and time. He appears well-developed and well-nourished. No distress.  HENT:  Head: Normocephalic and atraumatic.  Mouth/Throat: Oropharynx is clear and moist.  Eyes: Conjunctivae and EOM are normal. Pupils are equal, round, and reactive to light.  Neck: Normal range of motion.  Neck supple.  Cardiovascular: Normal rate, regular rhythm, normal heart sounds and intact distal pulses.  Exam reveals no gallop and no friction rub.   No murmur heard. Pulmonary/Chest: Effort normal and breath sounds normal. No respiratory distress.  Abdominal: Soft. Bowel sounds are normal. There is tenderness (diffuse). There is no rebound and no guarding.  Musculoskeletal: Normal range of motion. He exhibits no tenderness.  Neurological: He is alert and oriented to person, place, and time. No cranial nerve deficit.  Skin: Skin is warm and dry. No rash noted.    ED Course  Procedures (including critical care time)  Labs Reviewed  CBC - Abnormal; Notable for the following:    RBC 3.60 (*)    Hemoglobin 10.7 (*)    HCT 31.8 (*)    All other components within normal limits  URINALYSIS, ROUTINE W REFLEX MICROSCOPIC - Abnormal; Notable for the following:    Hgb urine dipstick TRACE (*)    Protein, ur >300 (*)    All other components within normal limits  GLUCOSE, CAPILLARY - Abnormal; Notable for the following:    Glucose-Capillary 104 (*)    All other components within normal limits  DIFFERENTIAL  URINE MICROSCOPIC-ADD ON  COMPREHENSIVE METABOLIC PANEL  LIPASE, BLOOD   No results found.   1. Diabetes mellitus   2. Nausea vomiting and diarrhea       MDM  Nausea, vomiting, diarrhea likely secondary to viral gastroenteritis versus metformin. Patient states he has a history of similar symptoms while taking metformin. Laboratory studies are relatively unremarkable. He was signed out to my colleague Ivonne Krysti Hickling [facility] who will followup on laboratory studies and his overall status. If he is improved he'll be discharged home with instructions to continue aggressive oral hydration and a prescription for Vicodin Zofran. He has a previously scheduled appointment in one week for reassessment. I instructed him to refrain from metformin unless his blood glucose is significantly higher than  his baseline.        Dayton Bailiff, MD 08/03/11 2003

## 2011-08-03 NOTE — ED Notes (Signed)
C/o nausea, vomiting, diarrhea, and LUQ pain x 3 days.

## 2011-12-08 ENCOUNTER — Emergency Department (HOSPITAL_BASED_OUTPATIENT_CLINIC_OR_DEPARTMENT_OTHER): Payer: Self-pay

## 2011-12-08 ENCOUNTER — Encounter (HOSPITAL_BASED_OUTPATIENT_CLINIC_OR_DEPARTMENT_OTHER): Payer: Self-pay | Admitting: *Deleted

## 2011-12-08 ENCOUNTER — Emergency Department (HOSPITAL_BASED_OUTPATIENT_CLINIC_OR_DEPARTMENT_OTHER)
Admission: EM | Admit: 2011-12-08 | Discharge: 2011-12-08 | Disposition: A | Discharge status: Home / Self Care | Payer: Self-pay | Attending: Emergency Medicine | Admitting: Emergency Medicine

## 2011-12-08 DIAGNOSIS — E119 Type 2 diabetes mellitus without complications: Secondary | ICD-10-CM | POA: Insufficient documentation

## 2011-12-08 DIAGNOSIS — R109 Unspecified abdominal pain: Secondary | ICD-10-CM | POA: Insufficient documentation

## 2011-12-08 DIAGNOSIS — K529 Noninfective gastroenteritis and colitis, unspecified: Secondary | ICD-10-CM

## 2011-12-08 DIAGNOSIS — R10817 Generalized abdominal tenderness: Secondary | ICD-10-CM | POA: Insufficient documentation

## 2011-12-08 DIAGNOSIS — I10 Essential (primary) hypertension: Secondary | ICD-10-CM | POA: Insufficient documentation

## 2011-12-08 DIAGNOSIS — K449 Diaphragmatic hernia without obstruction or gangrene: Secondary | ICD-10-CM | POA: Insufficient documentation

## 2011-12-08 DIAGNOSIS — K5289 Other specified noninfective gastroenteritis and colitis: Secondary | ICD-10-CM | POA: Insufficient documentation

## 2011-12-08 DIAGNOSIS — R1110 Vomiting, unspecified: Secondary | ICD-10-CM | POA: Insufficient documentation

## 2011-12-08 LAB — LIPASE, BLOOD: Lipase: 23 U/L (ref 11–59)

## 2011-12-08 LAB — URINE MICROSCOPIC-ADD ON

## 2011-12-08 LAB — URINALYSIS, ROUTINE W REFLEX MICROSCOPIC
Bilirubin Urine: NEGATIVE
Nitrite: NEGATIVE
Specific Gravity, Urine: 1.038 — ABNORMAL HIGH (ref 1.005–1.030)
Urobilinogen, UA: 1 mg/dL (ref 0.0–1.0)
pH: 7 (ref 5.0–8.0)

## 2011-12-08 LAB — HEPATIC FUNCTION PANEL
AST: 24 U/L (ref 0–37)
Albumin: 3.9 g/dL (ref 3.5–5.2)
Total Protein: 8.6 g/dL — ABNORMAL HIGH (ref 6.0–8.3)

## 2011-12-08 LAB — CBC WITH DIFFERENTIAL/PLATELET
Basophils Absolute: 0 10*3/uL (ref 0.0–0.1)
Eosinophils Relative: 1 % (ref 0–5)
HCT: 34.9 % — ABNORMAL LOW (ref 39.0–52.0)
Lymphocytes Relative: 16 % (ref 12–46)
Lymphs Abs: 1.8 10*3/uL (ref 0.7–4.0)
MCV: 86.8 fL (ref 78.0–100.0)
Monocytes Absolute: 0.7 10*3/uL (ref 0.1–1.0)
Neutro Abs: 8.2 10*3/uL — ABNORMAL HIGH (ref 1.7–7.7)
RBC: 4.02 MIL/uL — ABNORMAL LOW (ref 4.22–5.81)
RDW: 13.1 % (ref 11.5–15.5)
WBC: 10.7 10*3/uL — ABNORMAL HIGH (ref 4.0–10.5)

## 2011-12-08 LAB — BASIC METABOLIC PANEL
CO2: 26 mEq/L (ref 19–32)
Chloride: 100 mEq/L (ref 96–112)
GFR calc Af Amer: 81 mL/min — ABNORMAL LOW (ref >90)
Potassium: 3.9 mEq/L (ref 3.5–5.1)
Sodium: 137 mEq/L (ref 135–145)

## 2011-12-08 MED ORDER — CIPROFLOXACIN HCL 500 MG PO TABS: 500.0000 mg | ORAL_TABLET | Freq: Two times a day (BID) | ORAL | Status: AC

## 2011-12-08 MED ORDER — SODIUM CHLORIDE 0.9 % IV BOLUS (SEPSIS)
1000.0000 mL | Freq: Once | INTRAVENOUS | Status: AC
  Administered 2011-12-08: 1000 mL via INTRAVENOUS

## 2011-12-08 MED ORDER — ONDANSETRON HCL 4 MG/2ML IJ SOLN
4.0000 mg | Freq: Once | INTRAMUSCULAR | Status: AC
  Administered 2011-12-08: 4 mg via INTRAVENOUS

## 2011-12-08 MED ORDER — PROMETHAZINE HCL 25 MG/ML IJ SOLN
25.0000 mg | Freq: Once | INTRAMUSCULAR | Status: AC
  Administered 2011-12-08: 25 mg via INTRAVENOUS
  Filled 2011-12-08: qty 1

## 2011-12-08 MED ORDER — ONDANSETRON HCL 4 MG/2ML IJ SOLN
INTRAMUSCULAR | Status: AC
  Administered 2011-12-08: 4 mg via INTRAVENOUS
  Filled 2011-12-08: qty 2

## 2011-12-08 MED ORDER — IOHEXOL 300 MG/ML  SOLN: 10.0000 mL | Freq: Once | INTRAMUSCULAR | Status: DC | PRN

## 2011-12-08 MED ORDER — ONDANSETRON 4 MG PO TBDP: 4.0000 mg | ORAL_TABLET | Freq: Three times a day (TID) | ORAL | Status: AC | PRN

## 2011-12-08 MED ORDER — METRONIDAZOLE 500 MG PO TABS: 500.0000 mg | ORAL_TABLET | Freq: Two times a day (BID) | ORAL | Status: AC

## 2011-12-08 MED ORDER — MORPHINE SULFATE 4 MG/ML IJ SOLN
4.0000 mg | Freq: Once | INTRAMUSCULAR | Status: AC
  Administered 2011-12-08: 4 mg via INTRAVENOUS
  Filled 2011-12-08: qty 1

## 2011-12-08 MED ORDER — PROMETHAZINE HCL 25 MG PO TABS: 25.0000 mg | ORAL_TABLET | Freq: Four times a day (QID) | ORAL | Status: DC | PRN

## 2011-12-08 MED ORDER — IOHEXOL 300 MG/ML  SOLN: 100.0000 mL | Freq: Once | INTRAMUSCULAR | Status: DC | PRN

## 2011-12-08 NOTE — ED Provider Notes (Signed)
Medical screening examination/treatment/procedure(s) were performed by non-physician practitioner and as supervising physician I was immediately available for consultation/collaboration.   Jerryl Holzhauer B. Panagiota Perfetti, MD 12/08/11 2125 

## 2011-12-08 NOTE — ED Provider Notes (Signed)
History     CSN: [id number]  Arrival date & time 12/08/11  1124   First MD Initiated Contact with Patient 12/08/11 1211      Chief Complaint  Patient presents with  . Abdominal Pain    (Consider location/radiation/quality/duration/timing/severity/associated sxs/prior treatment) HPI Comments: Pt c/o generalized abdominal pain:pt states that he was seen 2 days ago at [place] and had a negative ultrasound:pt states that he was sent home on pain medication and vomiting and he hasn't had any relief  Patient is a 36 y.o. male presenting with abdominal pain. The history is provided by the patient.  Abdominal Pain The primary symptoms of the illness include abdominal pain, nausea, vomiting and diarrhea. The primary symptoms of the illness do not include fever or dysuria. The current episode started more than 2 days ago. The onset of the illness was sudden. The problem has not changed since onset.   Past Medical History  Diagnosis Date  . Hypertension   . Diabetes mellitus     type 2 iddm x 18 yrs  . Vascular disease     poor circulation to left foot    Past Surgical History  Procedure Date  . Cholecystectomy   . Toe amputation 2012    left foot; great toe and second toe  . Amputation 04/15/2011    Procedure: AMPUTATION RAY;  Surgeon: Nadara Mustard, MD;  Location: [facility] OR;  Service: Orthopedics;  Laterality: Left;  left foot third toe amputation and MPP joint and gastroc resection VS. achilles lengthing   . Amputation 04/25/2011    Procedure: AMPUTATION DIGIT;  Surgeon: Nadara Mustard, MD;  Location: [facility] OR;  Service: Orthopedics;  Laterality: Left;  Left foot 3rd toe amputation MTP joint, Gastroc Recession  Achilles Lengthening     No family history on file.  History  Substance Use Topics  . Smoking status: Current Everyday Smoker -- 0.2 packs/day for 1 years    Types: Cigarettes  . Smokeless tobacco: Never Used  . Alcohol Use: No      Review of Systems  Constitutional:  Negative for fever.  HENT: Negative.   Respiratory: Negative.   Cardiovascular: Negative.   Gastrointestinal: Positive for nausea, vomiting, abdominal pain and diarrhea.  Genitourinary: Negative for dysuria.  Musculoskeletal: Negative.     Allergies  Review of patient's allergies indicates no known allergies.  Home Medications   Current Outpatient Rx  Name Route Sig Dispense Refill  . GLIPIZIDE 10 MG PO TABS Oral Take 10 mg by mouth daily.     [name] LEVEMIR FLEXPEN [place] Subcutaneous Inject 20 Units into the skin at bedtime as needed. Based on CBGs    . LISINOPRIL 20 MG PO TABS Oral Take 20 mg by mouth daily.     [name] METFORMIN HCL 1000 MG PO TABS Oral Take 500 mg by mouth 2 (two) times daily.       BP 172/102  Pulse 81  Temp 97.7 F (36.5 C) (Oral)  Resp 22  Ht 6\' 1"  (1.854 m)  Wt 226 lb (102.513 kg)  BMI 29.82 kg/m2  SpO2 100%  Physical Exam  Nursing note and vitals reviewed. Constitutional: He is oriented to person, place, and time. He appears well-developed and well-nourished.  HENT:  Head: Normocephalic and atraumatic.  Cardiovascular: Normal rate and regular rhythm.   Pulmonary/Chest: Effort normal and breath sounds normal.  Abdominal: Soft. Bowel sounds are normal. There is generalized tenderness.  Musculoskeletal: Normal range of motion.  Neurological: He is  alert and oriented to person, place, and time.  Skin: Skin is warm and dry.  Psychiatric: He has a normal mood and affect.    ED Course  Procedures (including critical care time)  Labs Reviewed  BASIC METABOLIC PANEL - Abnormal; Notable for the following:    Glucose, Bld 163 (*)     GFR calc non Af Amer 70 (*)     GFR calc Af Amer 81 (*)     All other components within normal limits  CBC WITH DIFFERENTIAL - Abnormal; Notable for the following:    WBC 10.7 (*)     RBC 4.02 (*)     Hemoglobin 12.1 (*)     HCT 34.9 (*)     Neutro Abs 8.2 (*)     All other components within normal limits  URINALYSIS,  ROUTINE W REFLEX MICROSCOPIC - Abnormal; Notable for the following:    Specific Gravity, Urine 1.038 (*)     Hgb urine dipstick TRACE (*)     Protein, ur 100 (*)     All other components within normal limits  HEPATIC FUNCTION PANEL - Abnormal; Notable for the following:    Total Protein 8.6 (*)     All other components within normal limits  LIPASE, BLOOD  URINE MICROSCOPIC-ADD ON   Ct Abdomen Pelvis W Contrast  12/08/2011  *RADIOLOGY REPORT*  Clinical Data: Abdominal pain  CT ABDOMEN AND PELVIS WITH CONTRAST  Technique:  Multidetector CT imaging of the abdomen and pelvis was performed following the standard protocol during bolus administration of intravenous contrast.  Contrast:  100 ml of omni 300  Comparison: 10/27/2010  Findings: The lung bases are clear.  There is no focal liver abnormality.  Previous cholecystectomy.  No biliary dilatation.  The pancreas appears within normal limits. Normal appearance of the spleen.  Both adrenal glands are normal.  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  The left kidney appears normal.  No evidence for nephrolithiasis or obstructive uropathy.  The urinary bladder appears normal.  The prostate gland and seminal vesicles are negative.  There is no adenopathy within the upper abdomen.  No pelvic or inguinal adenopathy identified.  The patient has a small hiatal hernia.  The stomach appears normal.   The proximal small bowel loops are upper limits of normal in caliber measuring 2.9 cm, image 19. There is a small amount of free fluid within the right lower quadrant of the abdomen.  Mild edema/fluid is identified within the mesentery.  There is hyperemia of the vasa recta to the small bowel loops  Review of the visualized osseous structures is significant for mild L4-5 and L5 S1 degenerative disc disease.  IMPRESSION:  1.  Small bowel loops are upper limits of normal and there is mild edema and hyperemia within the small bowel mesentery. I suspect these findings  reflect an inflammatory or infectious enteritis with small bowel ileus. 2.  No evidence for high-grade bowel obstruction or abscess formation. 3.  Small amount of free fluid within the right lower quadrant of the abdomen likely secondary to inflammatory changes. 3.  Hiatal hernia.  Original Report Authenticated By: Rosealee Albee, [name]     1. Enteritis   2. Vomiting       MDM  Pt tolerating po here;will treat with antibiotic on out pt basis:pt not in dka        Teressa Lower, NP 12/08/11 1646

## 2011-12-08 NOTE — Discharge Instructions (Signed)
Nausea and Vomiting  Nausea is a sick feeling that often comes before throwing up (vomiting). Vomiting is a reflex where stomach contents come out of your mouth. Vomiting can cause severe loss of body fluids (dehydration). Children and elderly adults can become dehydrated quickly, especially if they also have diarrhea. Nausea and vomiting are symptoms of a condition or disease. It is important to find the cause of your symptoms.  CAUSES    Direct irritation of the stomach lining. This irritation can result from increased acid production (gastroesophageal reflux disease), infection, food poisoning, taking certain medicines (such as nonsteroidal anti-inflammatory drugs), alcohol use, or tobacco use.   Signals from the brain.These signals could be caused by a headache, heat exposure, an inner ear disturbance, increased pressure in the brain from injury, infection, a tumor, or a concussion, pain, emotional stimulus, or metabolic problems.   An obstruction in the gastrointestinal tract (bowel obstruction).   Illnesses such as diabetes, hepatitis, gallbladder problems, appendicitis, kidney problems, cancer, sepsis, atypical symptoms of a heart attack, or eating disorders.   Medical treatments such as chemotherapy and radiation.   Receiving medicine that makes you sleep (general anesthetic) during surgery.  DIAGNOSIS  Your caregiver may ask for tests to be done if the problems do not improve after a few days. Tests may also be done if symptoms are severe or if the reason for the nausea and vomiting is not clear. Tests may include:   Urine tests.   Blood tests.   Stool tests.   Cultures (to look for evidence of infection).   X-rays or other imaging studies.  Test results can help your caregiver make decisions about treatment or the need for additional tests.  TREATMENT  You need to stay well hydrated. Drink frequently but in small amounts.You may wish to drink water, sports drinks, clear broth, or eat frozen  ice pops or gelatin dessert to help stay hydrated.When you eat, eating slowly may help prevent nausea.There are also some antinausea medicines that may help prevent nausea.  HOME CARE INSTRUCTIONS    Take all medicine as directed by your caregiver.   If you do not have an appetite, do not force yourself to eat. However, you must continue to drink fluids.   If you have an appetite, eat a normal diet unless your caregiver tells you differently.   Eat a variety of complex carbohydrates (rice, wheat, potatoes, bread), lean meats, yogurt, fruits, and vegetables.   Avoid high-fat foods because they are more difficult to digest.   Drink enough water and fluids to keep your urine clear or pale yellow.   If you are dehydrated, ask your caregiver for specific rehydration instructions. Signs of dehydration may include:   Severe thirst.   Dry lips and mouth.   Dizziness.   Dark urine.   Decreasing urine frequency and amount.   Confusion.   Rapid breathing or pulse.  SEEK IMMEDIATE MEDICAL CARE IF:    You have blood or brown flecks (like coffee grounds) in your vomit.   You have black or bloody stools.   You have a severe headache or stiff neck.   You are confused.   You have severe abdominal pain.   You have chest pain or trouble breathing.   You do not urinate at least once every 8 hours.   You develop cold or clammy skin.   You continue to vomit for longer than 24 to 48 hours.   You have a fever.  MAKE SURE YOU:      Understand these instructions.   Will watch your condition.   Will get help right away if you are not doing well or get worse.  Document Released: 05/26/2005 Document Revised: 05/15/2011 Document Reviewed: 10/23/2010  [employer] Patient Information 2012 [employer].

## 2011-12-08 NOTE — ED Notes (Signed)
This rn enters pt room with erin, from radiology. Pt noted putting finger down his throat. Pt instructed not to put finger down his throat to induce vomiting. Pt given two cups water, instructed to drink over 30 minutes. Pt verbalizes understanding of importance of drinking water for quality of ct scan.

## 2011-12-08 NOTE — ED Notes (Signed)
Sent to [facility] for information on patient and have received--gave to [place] at 13:05.

## 2011-12-08 NOTE — ED Notes (Signed)
Patient states he developed generalized abdominal pain 4 days ago which is associated with nausea, vomiting and diarrhea..  States he was seen at [facility] 2 days ago, given IVF, phenergan and an ultrasound which was negative.

## 2011-12-10 ENCOUNTER — Emergency Department (HOSPITAL_COMMUNITY)
Admission: EM | Admit: 2011-12-10 | Discharge: 2011-12-10 | Disposition: A | Discharge status: Home / Self Care | Payer: Self-pay | Attending: Emergency Medicine | Admitting: Emergency Medicine

## 2011-12-10 ENCOUNTER — Emergency Department (HOSPITAL_COMMUNITY): Payer: Self-pay

## 2011-12-10 ENCOUNTER — Encounter (HOSPITAL_COMMUNITY): Payer: Self-pay | Admitting: Emergency Medicine

## 2011-12-10 DIAGNOSIS — K529 Noninfective gastroenteritis and colitis, unspecified: Secondary | ICD-10-CM

## 2011-12-10 DIAGNOSIS — F17200 Nicotine dependence, unspecified, uncomplicated: Secondary | ICD-10-CM | POA: Insufficient documentation

## 2011-12-10 DIAGNOSIS — E119 Type 2 diabetes mellitus without complications: Secondary | ICD-10-CM | POA: Insufficient documentation

## 2011-12-10 DIAGNOSIS — I10 Essential (primary) hypertension: Secondary | ICD-10-CM | POA: Insufficient documentation

## 2011-12-10 DIAGNOSIS — R109 Unspecified abdominal pain: Secondary | ICD-10-CM | POA: Insufficient documentation

## 2011-12-10 DIAGNOSIS — R112 Nausea with vomiting, unspecified: Secondary | ICD-10-CM

## 2011-12-10 LAB — URINALYSIS, ROUTINE W REFLEX MICROSCOPIC
Glucose, UA: NEGATIVE mg/dL
Leukocytes, UA: NEGATIVE
Nitrite: NEGATIVE
Protein, ur: >300 mg/dL — AB
pH: 7 (ref 5.0–8.0)

## 2011-12-10 LAB — COMPREHENSIVE METABOLIC PANEL
Albumin: 3.8 g/dL (ref 3.5–5.2)
BUN: 14 mg/dL (ref 6–23)
Creatinine, Ser: 1.16 mg/dL (ref 0.50–1.35)
GFR calc Af Amer: >90 mL/min (ref >90)
Total Bilirubin: 0.4 mg/dL (ref 0.3–1.2)
Total Protein: 8.5 g/dL — ABNORMAL HIGH (ref 6.0–8.3)

## 2011-12-10 LAB — CBC WITH DIFFERENTIAL/PLATELET
Basophils Relative: 0 % (ref 0–1)
Eosinophils Absolute: 0 10*3/uL (ref 0.0–0.7)
Eosinophils Relative: 0 % (ref 0–5)
HCT: 35.3 % — ABNORMAL LOW (ref 39.0–52.0)
Hemoglobin: 12.5 g/dL — ABNORMAL LOW (ref 13.0–17.0)
MCH: 30.3 pg (ref 26.0–34.0)
MCHC: 35.4 g/dL (ref 30.0–36.0)
MCV: 85.7 fL (ref 78.0–100.0)
Monocytes Absolute: 0.5 10*3/uL (ref 0.1–1.0)
Monocytes Relative: 5 % (ref 3–12)

## 2011-12-10 LAB — URINE MICROSCOPIC-ADD ON

## 2011-12-10 MED ORDER — HYDROCODONE-ACETAMINOPHEN 5-325 MG PO TABS: 1.0000 | ORAL_TABLET | Freq: Four times a day (QID) | ORAL | Status: AC | PRN

## 2011-12-10 MED ORDER — SODIUM CHLORIDE 0.9 % IV BOLUS (SEPSIS)
1000.0000 mL | Freq: Once | INTRAVENOUS | Status: AC
  Administered 2011-12-10 (×2): 1000 mL via INTRAVENOUS

## 2011-12-10 MED ORDER — PANTOPRAZOLE SODIUM 40 MG IV SOLR
40.0000 mg | Freq: Once | INTRAVENOUS | Status: AC
  Administered 2011-12-10: 40 mg via INTRAVENOUS
  Filled 2011-12-10: qty 40

## 2011-12-10 MED ORDER — METOCLOPRAMIDE HCL 5 MG/ML IJ SOLN
10.0000 mg | Freq: Once | INTRAMUSCULAR | Status: AC
  Administered 2011-12-10: 10 mg via INTRAVENOUS
  Filled 2011-12-10: qty 2

## 2011-12-10 MED ORDER — FENTANYL CITRATE 0.05 MG/ML IJ SOLN
50.0000 ug | Freq: Once | INTRAMUSCULAR | Status: AC
  Administered 2011-12-10: 50 ug via INTRAVENOUS
  Filled 2011-12-10: qty 2

## 2011-12-10 MED ORDER — ONDANSETRON 8 MG PO TBDP: 8.0000 mg | ORAL_TABLET | Freq: Once | ORAL | Status: DC

## 2011-12-10 MED ORDER — PROMETHAZINE HCL 25 MG RE SUPP: 25.0000 mg | Freq: Four times a day (QID) | RECTAL | Status: DC | PRN

## 2011-12-10 MED ORDER — ONDANSETRON HCL 4 MG/2ML IJ SOLN
4.0000 mg | Freq: Once | INTRAMUSCULAR | Status: AC
  Administered 2011-12-10: 4 mg via INTRAVENOUS
  Filled 2011-12-10: qty 2

## 2011-12-10 NOTE — ED Provider Notes (Signed)
History     CSN: [id number]  Arrival date & time 12/10/11  1301   First MD Initiated Contact with Patient 12/10/11 1319      3:19 PM HPI Pt reports persistent abdominal pain, N/v for 5 days. States he has been diagnosed with Enteritis. Spouse reports he is unable to keep medications down.Denies chang in BMs, or urinary symptoms. Reports similar symptoms in the past but never this severe. Patient is a 36 y.o. male presenting with abdominal pain.  Abdominal Pain The primary symptoms of the illness include abdominal pain, nausea and vomiting. The primary symptoms of the illness do not include fever, shortness of breath, diarrhea or dysuria. The current episode started more than 2 days ago. The onset of the illness was gradual. The problem has been gradually worsening.  The patient has not had a change in bowel habit. Symptoms associated with the illness do not include chills, constipation, urgency, hematuria, frequency or back pain. Significant associated medical issues include diabetes.    Past Medical History  Diagnosis Date  . Hypertension   . Diabetes mellitus     type 2 iddm x 18 yrs  . Vascular disease     poor circulation to left foot    Past Surgical History  Procedure Date  . Cholecystectomy   . Toe amputation 2012    left foot; great toe and second toe  . Amputation 04/15/2011    Procedure: AMPUTATION RAY;  Surgeon: Nadara Mustard, MD;  Location: [facility] OR;  Service: Orthopedics;  Laterality: Left;  left foot third toe amputation and MPP joint and gastroc resection VS. achilles lengthing   . Amputation 04/25/2011    Procedure: AMPUTATION DIGIT;  Surgeon: Nadara Mustard, MD;  Location: [facility] OR;  Service: Orthopedics;  Laterality: Left;  Left foot 3rd toe amputation MTP joint, Gastroc Recession  Achilles Lengthening     No family history on file.  History  Substance Use Topics  . Smoking status: Current Everyday Smoker -- 0.2 packs/day for 1 years    Types: Cigarettes  .  Smokeless tobacco: Never Used  . Alcohol Use: No      Review of Systems  Constitutional: Negative for fever and chills.  Respiratory: Negative for shortness of breath.   Cardiovascular: Negative for chest pain.  Gastrointestinal: Positive for nausea, vomiting and abdominal pain. Negative for diarrhea, constipation, blood in stool and rectal pain.  Genitourinary: Negative for dysuria, urgency, frequency, hematuria, flank pain, discharge, penile pain and testicular pain.  Musculoskeletal: Negative for back pain.  Neurological: Negative for dizziness, weakness, numbness and headaches.  All other systems reviewed and are negative.    Allergies  Review of patient's allergies indicates no known allergies.  Home Medications   Current Outpatient Rx  Name Route Sig Dispense Refill  . CIPROFLOXACIN HCL 500 MG PO TABS Oral Take 1 tablet (500 mg total) by mouth every 12 (twelve) hours. 14 tablet 0  . LEVEMIR FLEXPEN [place] Subcutaneous Inject 20-30 Units into the skin at bedtime as needed. Based on CBGs    . LISINOPRIL 20 MG PO TABS Oral Take 20 mg by mouth daily.     [name] METFORMIN HCL 1000 MG PO TABS Oral Take 500 mg by mouth 2 (two) times daily.     [name] METRONIDAZOLE 500 MG PO TABS Oral Take 1 tablet (500 mg total) by mouth 2 (two) times daily. 14 tablet 0  . ONDANSETRON 4 MG PO TBDP Oral Take 1 tablet (4 mg total) by mouth  every 8 (eight) hours as needed for nausea. 20 tablet 0  . PROMETHAZINE HCL 25 MG PO TABS Oral Take 1 tablet (25 mg total) by mouth every 6 (six) hours as needed for nausea. 15 tablet 0    BP 177/101  Pulse 105  Temp 99.9 F (37.7 C) (Oral)  Resp 26  Ht 6\' 1"  (1.854 m)  Wt 235 lb (106.595 kg)  BMI 31.00 kg/m2  SpO2 99%  Physical Exam  Vitals reviewed. Constitutional: He is oriented to person, place, and time. He appears well-developed and well-nourished.  HENT:  Head: Normocephalic and atraumatic.  Eyes: Conjunctivae are normal. Pupils are equal, round, and reactive  to light.  Neck: Normal range of motion. Neck supple.  Cardiovascular: Normal rate, regular rhythm and normal heart sounds.   Pulmonary/Chest: Effort normal and breath sounds normal.  Abdominal: Soft. Bowel sounds are normal. He exhibits no distension and no mass. There is tenderness (right sided). There is no rebound and no guarding.  Neurological: He is alert and oriented to person, place, and time.  Skin: Skin is warm and dry. No rash noted. No erythema. No pallor.  Psychiatric: He has a normal mood and affect. His behavior is normal.    ED Course  Procedures   Results for orders placed during the hospital encounter of 12/10/11  CBC WITH DIFFERENTIAL      Component Value Range   WBC 9.9  4.0 - 10.5 K/uL   RBC 4.12 (*) 4.22 - 5.81 MIL/uL   Hemoglobin 12.5 (*) 13.0 - 17.0 g/dL   HCT 16.1 (*) 09.6 - 04.5 %   MCV 85.7  78.0 - 100.0 fL   MCH 30.3  26.0 - 34.0 pg   MCHC 35.4  30.0 - 36.0 g/dL   RDW 40.9  81.1 - 91.4 %   Platelets 253  150 - 400 K/uL   Neutrophils Relative 83 (*) 43 - 77 %   Neutro Abs 8.2 (*) 1.7 - 7.7 K/uL   Lymphocytes Relative 12  12 - 46 %   Lymphs Abs 1.2  0.7 - 4.0 K/uL   Monocytes Relative 5  3 - 12 %   Monocytes Absolute 0.5  0.1 - 1.0 K/uL   Eosinophils Relative 0  0 - 5 %   Eosinophils Absolute 0.0  0.0 - 0.7 K/uL   Basophils Relative 0  0 - 1 %   Basophils Absolute 0.0  0.0 - 0.1 K/uL  COMPREHENSIVE METABOLIC PANEL      Component Value Range   Sodium 134 (*) 135 - 145 mEq/L   Potassium 3.5  3.5 - 5.1 mEq/L   Chloride 99  96 - 112 mEq/L   CO2 24  19 - 32 mEq/L   Glucose, Bld 153 (*) 70 - 99 mg/dL   BUN 14  6 - 23 mg/dL   Creatinine, Ser 7.82  0.50 - 1.35 mg/dL   Calcium 9.8  8.4 - 95.6 mg/dL   Total Protein 8.5 (*) 6.0 - 8.3 g/dL   Albumin 3.8  3.5 - 5.2 g/dL   AST 16  0 - 37 U/L   ALT 16  0 - 53 U/L   Alkaline Phosphatase 89  39 - 117 U/L   Total Bilirubin 0.4  0.3 - 1.2 mg/dL   GFR calc non Af Amer 80 (*) >90 mL/min   GFR calc Af Amer >90   >90 mL/min  URINALYSIS, ROUTINE W REFLEX MICROSCOPIC      Component Value Range  Color, Urine AMBER (*) YELLOW   APPearance CLEAR  CLEAR   Specific Gravity, Urine 1.025  1.005 - 1.030   pH 7.0  5.0 - 8.0   Glucose, UA NEGATIVE  NEGATIVE mg/dL   Hgb urine dipstick TRACE (*) NEGATIVE   Bilirubin Urine NEGATIVE  NEGATIVE   Ketones, ur TRACE (*) NEGATIVE mg/dL   Protein, ur >829 (*) NEGATIVE mg/dL   Urobilinogen, UA 0.2  0.0 - 1.0 mg/dL   Nitrite NEGATIVE  NEGATIVE   Leukocytes, UA NEGATIVE  NEGATIVE  URINE MICROSCOPIC-ADD ON      Component Value Range   Squamous Epithelial / LPF RARE  RARE   RBC / HPF 0-2  <3 RBC/hpf   Bacteria, UA FEW (*) RARE   Urine-Other MUCOUS PRESENT     Ct Abdomen Pelvis W Contrast  12/08/2011  *RADIOLOGY REPORT*  Clinical Data: Abdominal pain  CT ABDOMEN AND PELVIS WITH CONTRAST  Technique:  Multidetector CT imaging of the abdomen and pelvis was performed following the standard protocol during bolus administration of intravenous contrast.  Contrast:  100 ml of omni 300  Comparison: 10/27/2010  Findings: The lung bases are clear.  There is no focal liver abnormality.  Previous cholecystectomy.  No biliary dilatation.  The pancreas appears within normal limits. Normal appearance of the spleen.  Both adrenal glands are normal.  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  The left kidney appears normal.  No evidence for nephrolithiasis or obstructive uropathy.  The urinary bladder appears normal.  The prostate gland and seminal vesicles are negative.  There is no adenopathy within the upper abdomen.  No pelvic or inguinal adenopathy identified.  The patient has a small hiatal hernia.  The stomach appears normal.   The proximal small bowel loops are upper limits of normal in caliber measuring 2.9 cm, image 19. There is a small amount of free fluid within the right lower quadrant of the abdomen.  Mild edema/fluid is identified within the mesentery.  There is hyperemia of the  vasa recta to the small bowel loops  Review of the visualized osseous structures is significant for mild L4-5 and L5 S1 degenerative disc disease.  IMPRESSION:  1.  Small bowel loops are upper limits of normal and there is mild edema and hyperemia within the small bowel mesentery. I suspect these findings reflect an inflammatory or infectious enteritis with small bowel ileus. 2.  No evidence for high-grade bowel obstruction or abscess formation. 3.  Small amount of free fluid within the right lower quadrant of the abdomen likely secondary to inflammatory changes. 3.  Hiatal hernia.  Original Report Authenticated By: Rosealee Albee, [name]     MDM   Reports feeling better after reglan and fentanyl. No acute changes on labs or imaging. CT 2 days ago indicated gastritis. Will d/c with analgesics and Phenergan suppository. Pt voices understanding and is ready for d/c      Thomasene Lot, PA-C 12/10/11 1832

## 2011-12-10 NOTE — ED Provider Notes (Signed)
Medical screening examination/treatment/procedure(s) were performed by non-physician practitioner and as supervising physician I was immediately available for consultation/collaboration.  Desira Alessandrini R. Deshawn Witty, MD 12/10/11 2355 

## 2011-12-10 NOTE — ED Notes (Signed)
Pt reports right sided abdominal pain that started this today at 6 am pt has nausea and vomiting. Pt reports fever. Denies diarrhea.

## 2012-02-06 ENCOUNTER — Encounter (HOSPITAL_COMMUNITY): Payer: Self-pay | Admitting: *Deleted

## 2012-02-06 ENCOUNTER — Emergency Department (HOSPITAL_COMMUNITY)
Admission: EM | Admit: 2012-02-06 | Discharge: 2012-02-06 | Disposition: A | Discharge status: Home / Self Care | Payer: Self-pay | Attending: Emergency Medicine | Admitting: Emergency Medicine

## 2012-02-06 DIAGNOSIS — S98139A Complete traumatic amputation of one unspecified lesser toe, initial encounter: Secondary | ICD-10-CM | POA: Insufficient documentation

## 2012-02-06 DIAGNOSIS — K0889 Other specified disorders of teeth and supporting structures: Secondary | ICD-10-CM

## 2012-02-06 DIAGNOSIS — F17200 Nicotine dependence, unspecified, uncomplicated: Secondary | ICD-10-CM | POA: Insufficient documentation

## 2012-02-06 DIAGNOSIS — I10 Essential (primary) hypertension: Secondary | ICD-10-CM | POA: Insufficient documentation

## 2012-02-06 DIAGNOSIS — E119 Type 2 diabetes mellitus without complications: Secondary | ICD-10-CM | POA: Insufficient documentation

## 2012-02-06 DIAGNOSIS — K029 Dental caries, unspecified: Secondary | ICD-10-CM | POA: Insufficient documentation

## 2012-02-06 MED ORDER — PENICILLIN V POTASSIUM 250 MG PO TABS
500.0000 mg | ORAL_TABLET | Freq: Once | ORAL | Status: AC
  Administered 2012-02-06: 500 mg via ORAL
  Filled 2012-02-06: qty 2

## 2012-02-06 MED ORDER — PENICILLIN V POTASSIUM 500 MG PO TABS: 500.0000 mg | ORAL_TABLET | Freq: Three times a day (TID) | ORAL | Status: AC

## 2012-02-06 MED ORDER — HYDROCODONE-ACETAMINOPHEN 5-325 MG PO TABS
2.0000 | ORAL_TABLET | Freq: Once | ORAL | Status: AC
  Administered 2012-02-06: 2 via ORAL
  Filled 2012-02-06: qty 2

## 2012-02-06 MED ORDER — HYDROCODONE-ACETAMINOPHEN 5-325 MG PO TABS
1.0000 | ORAL_TABLET | ORAL | Status: AC | PRN
Start: 2012-02-06 — End: 2012-02-16

## 2012-02-06 NOTE — ED Provider Notes (Signed)
History     CSN: [id number]  Arrival date & time 02/06/12  0050   First MD Initiated Contact with Patient 02/06/12 0109      Chief Complaint  Patient presents with  . Dental Pain  . Otalgia  . Sore Throat   HPI  History provided by the patient. Patient is a 36 year old African American male with history of hypertension and diabetes who presents with complaints of increasing left lower molar dental pains. Patient reports long history of broken indicate molar teeth. He states he never has significant problems her pains with this until the past 2-3 days. He denies any injury to the teeth. Patient tried using some over-the-counter pain medications without relief. Pain radiates to left ear and to the back throat area. He has pain with any eating or sometimes swallowing. He denies any fever, chills or sweats.    Past Medical History  Diagnosis Date  . Hypertension   . Diabetes mellitus     type 2 iddm x 18 yrs  . Vascular disease     poor circulation to left foot    Past Surgical History  Procedure Date  . Cholecystectomy   . Toe amputation 2012    left foot; great toe and second toe  . Amputation 04/15/2011    Procedure: AMPUTATION RAY;  Surgeon: Nadara Mustard, MD;  Location: [facility];  Service: Orthopedics;  Laterality: Left;  left foot third toe amputation and MPP joint and gastroc resection VS. achilles lengthing   . Amputation 04/25/2011    Procedure: AMPUTATION DIGIT;  Surgeon: Nadara Mustard, MD;  Location: [facility] OR;  Service: Orthopedics;  Laterality: Left;  Left foot 3rd toe amputation MTP joint, Gastroc Recession  Achilles Lengthening     No family history on file.  History  Substance Use Topics  . Smoking status: Current Everyday Smoker -- 0.2 packs/day for 1 years    Types: Cigarettes  . Smokeless tobacco: Never Used  . Alcohol Use: No      Review of Systems  Constitutional: Negative for fever, chills and diaphoresis.  HENT: Positive for ear pain and dental  problem. Negative for trouble swallowing and voice change.   Gastrointestinal: Negative for nausea and vomiting.    Allergies  Review of patient's allergies indicates no known allergies.  Home Medications   Current Outpatient Rx  Name Route Sig Dispense Refill  . LEVEMIR FLEXPEN [place] Subcutaneous Inject 20-30 Units into the skin at bedtime as needed. Based on CBGs    . LISINOPRIL 20 MG PO TABS Oral Take 20 mg by mouth daily.     [name] METFORMIN HCL 1000 MG PO TABS Oral Take 500 mg by mouth 2 (two) times daily.     [name] PROMETHAZINE HCL 25 MG RE SUPP Rectal Place 1 suppository (25 mg total) rectally every 6 (six) hours as needed for nausea. 12 each 0  . PROMETHAZINE HCL 25 MG PO TABS Oral Take 1 tablet (25 mg total) by mouth every 6 (six) hours as needed for nausea. 15 tablet 0    BP 134/86  Pulse 87  Temp 98.3 F (36.8 C) (Oral)  Resp 16  SpO2 98%  Physical Exam  Nursing note and vitals reviewed. Constitutional: He is oriented to person, place, and time. He appears well-developed and well-nourished. No distress.  HENT:  Head: Normocephalic and atraumatic.  Mouth/Throat: Oropharynx is clear and moist.    Neck: Normal range of motion. Neck supple.       No  meningeal signs.   Cardiovascular: Normal rate and regular rhythm.   Pulmonary/Chest: Effort normal and breath sounds normal.  Lymphadenopathy:    He has no cervical adenopathy.  Neurological: He is alert and oriented to person, place, and time.  Skin: Skin is warm and dry.  Psychiatric: He has a normal mood and affect. His behavior is normal.    ED Course  Procedures       1. Pain, dental   2. Dental decay       MDM  1:15AM patient seen and evaluated. Patient with widespread dental decay especially over molars bilaterally and top and bottom. Left lower 3 molars are all decayed to the gumline. There is tenderness to palpation along this area. No swelling under the tongue. No significant swelling of the canal. No  asymmetry of face.        Angus Seller, [place] 02/06/12 [phone]

## 2012-02-06 NOTE — ED Notes (Signed)
Pt c/o L lower molar dental pain since Mon.  Tues he began to experience L ear and throat pain.  Denies fever.

## 2012-02-06 NOTE — ED Provider Notes (Signed)
Medical screening examination/treatment/procedure(s) were performed by non-physician practitioner and as supervising physician I was immediately available for consultation/collaboration.  Sunnie Nielsen, MD 02/06/12 [phone]

## 2012-02-06 NOTE — ED Notes (Signed)
Pt states he developed dental pain in left lower molar area 4 days ago.  He also c/o left ear pain and sore throat for 3 days.  Denies Fever, chills.  Pain rated 10/10 , not relieved by tylenol.  Hurts to swallow but does not feel airway is obstructing.  Throat reddened, three broken teeth noted on bottom left.

## 2012-07-06 ENCOUNTER — Encounter (HOSPITAL_COMMUNITY): Payer: Self-pay | Admitting: Cardiology

## 2012-07-06 ENCOUNTER — Emergency Department (HOSPITAL_COMMUNITY)
Admission: EM | Admit: 2012-07-06 | Discharge: 2012-07-06 | Disposition: A | Discharge status: Home / Self Care | Payer: Medicaid Other | Attending: Emergency Medicine | Admitting: Emergency Medicine

## 2012-07-06 ENCOUNTER — Emergency Department (HOSPITAL_COMMUNITY): Payer: Medicaid Other

## 2012-07-06 DIAGNOSIS — Z79899 Other long term (current) drug therapy: Secondary | ICD-10-CM | POA: Insufficient documentation

## 2012-07-06 DIAGNOSIS — J3489 Other specified disorders of nose and nasal sinuses: Secondary | ICD-10-CM | POA: Insufficient documentation

## 2012-07-06 DIAGNOSIS — J029 Acute pharyngitis, unspecified: Secondary | ICD-10-CM | POA: Insufficient documentation

## 2012-07-06 DIAGNOSIS — F17200 Nicotine dependence, unspecified, uncomplicated: Secondary | ICD-10-CM | POA: Insufficient documentation

## 2012-07-06 DIAGNOSIS — E119 Type 2 diabetes mellitus without complications: Secondary | ICD-10-CM | POA: Insufficient documentation

## 2012-07-06 DIAGNOSIS — Z9089 Acquired absence of other organs: Secondary | ICD-10-CM | POA: Insufficient documentation

## 2012-07-06 DIAGNOSIS — I10 Essential (primary) hypertension: Secondary | ICD-10-CM | POA: Insufficient documentation

## 2012-07-06 DIAGNOSIS — R509 Fever, unspecified: Secondary | ICD-10-CM | POA: Insufficient documentation

## 2012-07-06 DIAGNOSIS — R112 Nausea with vomiting, unspecified: Secondary | ICD-10-CM | POA: Insufficient documentation

## 2012-07-06 DIAGNOSIS — Z8679 Personal history of other diseases of the circulatory system: Secondary | ICD-10-CM | POA: Insufficient documentation

## 2012-07-06 DIAGNOSIS — IMO0001 Reserved for inherently not codable concepts without codable children: Secondary | ICD-10-CM | POA: Insufficient documentation

## 2012-07-06 DIAGNOSIS — N179 Acute kidney failure, unspecified: Secondary | ICD-10-CM | POA: Insufficient documentation

## 2012-07-06 DIAGNOSIS — R0602 Shortness of breath: Secondary | ICD-10-CM | POA: Insufficient documentation

## 2012-07-06 LAB — COMPREHENSIVE METABOLIC PANEL
BUN: 16 mg/dL (ref 6–23)
CO2: 23 mEq/L (ref 19–32)
Calcium: 8.9 mg/dL (ref 8.4–10.5)
Chloride: 101 mEq/L (ref 96–112)
Creatinine, Ser: 1.47 mg/dL — ABNORMAL HIGH (ref 0.50–1.35)
GFR calc Af Amer: 70 mL/min — ABNORMAL LOW (ref >90)
GFR calc non Af Amer: 60 mL/min — ABNORMAL LOW (ref >90)
Glucose, Bld: 104 mg/dL — ABNORMAL HIGH (ref 70–99)
Total Bilirubin: 0.3 mg/dL (ref 0.3–1.2)

## 2012-07-06 LAB — CBC WITH DIFFERENTIAL/PLATELET
Basophils Absolute: 0 10*3/uL (ref 0.0–0.1)
Eosinophils Relative: 3 % (ref 0–5)
HCT: 35.4 % — ABNORMAL LOW (ref 39.0–52.0)
Hemoglobin: 11.7 g/dL — ABNORMAL LOW (ref 13.0–17.0)
Lymphocytes Relative: 10 % — ABNORMAL LOW (ref 12–46)
Lymphs Abs: 0.6 10*3/uL — ABNORMAL LOW (ref 0.7–4.0)
MCV: 90.1 fL (ref 78.0–100.0)
Monocytes Absolute: 1 10*3/uL (ref 0.1–1.0)
Monocytes Relative: 15 % — ABNORMAL HIGH (ref 3–12)
Neutro Abs: 4.9 10*3/uL (ref 1.7–7.7)
RDW: 14.7 % (ref 11.5–15.5)
WBC: 6.8 10*3/uL (ref 4.0–10.5)

## 2012-07-06 MED ORDER — HYDROCODONE-ACETAMINOPHEN 7.5-325 MG/15ML PO SOLN: 15.0000 mL | Freq: Four times a day (QID) | ORAL | Status: DC | PRN

## 2012-07-06 MED ORDER — SODIUM CHLORIDE 0.9 % IV BOLUS (SEPSIS)
1000.0000 mL | Freq: Once | INTRAVENOUS | Status: AC
  Administered 2012-07-06: 1000 mL via INTRAVENOUS

## 2012-07-06 MED ORDER — MORPHINE SULFATE 4 MG/ML IJ SOLN
4.0000 mg | INTRAMUSCULAR | Status: DC | PRN
  Administered 2012-07-06: 4 mg via INTRAVENOUS
  Filled 2012-07-06: qty 1

## 2012-07-06 MED ORDER — PROMETHAZINE HCL 25 MG PO TABS: 25.0000 mg | ORAL_TABLET | Freq: Four times a day (QID) | ORAL | Status: DC | PRN

## 2012-07-06 MED ORDER — ONDANSETRON HCL 4 MG/2ML IJ SOLN
4.0000 mg | Freq: Once | INTRAMUSCULAR | Status: AC
  Administered 2012-07-06: 4 mg via INTRAVENOUS
  Filled 2012-07-06: qty 2

## 2012-07-06 MED ORDER — ACETAMINOPHEN 325 MG PO TABS
650.0000 mg | ORAL_TABLET | Freq: Once | ORAL | Status: AC
  Administered 2012-07-06: 650 mg via ORAL
  Filled 2012-07-06: qty 2

## 2012-07-06 NOTE — ED Provider Notes (Signed)
History     CSN: [id number]  Arrival date & time 07/06/12  7829   First MD Initiated Contact with Patient 07/06/12 [phone]      Chief Complaint  Patient presents with  . Cough  . Fever    (Consider location/radiation/quality/duration/timing/severity/associated sxs/prior treatment) HPI  Ziv Kooi is a 37 y.o. male complaining of fever, N/V, productive cough, sore throat, rhinorrhea, myoglia, SOB, Denies change in bowel or bladder habits, rash or meningismus. POsitive sick contacts at work. Pt is not tolerating PO and has not eaten in 3 days.   Past Medical History  Diagnosis Date  . Hypertension   . Diabetes mellitus     type 2 iddm x 18 yrs  . Vascular disease     poor circulation to left foot    Past Surgical History  Procedure Date  . Cholecystectomy   . Toe amputation 2012    left foot; great toe and second toe  . Amputation 04/15/2011    Procedure: AMPUTATION RAY;  Surgeon: Nadara Mustard, MD;  Location: [facility] OR;  Service: Orthopedics;  Laterality: Left;  left foot third toe amputation and MPP joint and gastroc resection VS. achilles lengthing   . Amputation 04/25/2011    Procedure: AMPUTATION DIGIT;  Surgeon: Nadara Mustard, MD;  Location: [facility] OR;  Service: Orthopedics;  Laterality: Left;  Left foot 3rd toe amputation MTP joint, Gastroc Recession  Achilles Lengthening     History reviewed. No pertinent family history.  History  Substance Use Topics  . Smoking status: Current Every Day Smoker -- 0.2 packs/day for 1 years    Types: Cigarettes  . Smokeless tobacco: Never Used  . Alcohol Use: No      Review of Systems  Constitutional: Negative for fever.  Respiratory: Negative for shortness of breath.   Cardiovascular: Negative for chest pain.  Gastrointestinal: Negative for nausea, vomiting, abdominal pain and diarrhea.  All other systems reviewed and are negative.    Allergies  Review of patient's allergies indicates no known allergies.  Home Medications    Current Outpatient Rx  Name  Route  Sig  Dispense  Refill  . LEVEMIR FLEXPEN [place]   Subcutaneous   Inject 20 Units into the skin at bedtime. Based on CBGs         . LISINOPRIL 20 MG PO TABS   Oral   Take 20 mg by mouth daily.          [name] METFORMIN HCL 1000 MG PO TABS   Oral   Take 500 mg by mouth daily with breakfast.            BP 154/83  Pulse 95  Temp 100.6 F (38.1 C) (Oral)  Resp 18  SpO2 100%  Physical Exam  Nursing note and vitals reviewed. Constitutional: He is oriented to person, place, and time. He appears well-developed and well-nourished. No distress.  HENT:  Head: Normocephalic and atraumatic.  Mouth/Throat: Oropharynx is clear and moist.  Eyes: Conjunctivae normal and EOM are normal. Pupils are equal, round, and reactive to light.  Neck: Normal range of motion. Neck supple.  Cardiovascular: Normal rate, regular rhythm and intact distal pulses.   Pulmonary/Chest: Effort normal and breath sounds normal. No stridor. No respiratory distress. He has no wheezes. He has no rales. He exhibits no tenderness.  Abdominal: Soft. Bowel sounds are normal. He exhibits no distension and no mass. There is no tenderness. There is no rebound and no guarding.  Musculoskeletal: Normal range of  motion.  Neurological: He is alert and oriented to person, place, and time.  Psychiatric: He has a normal mood and affect.    ED Course  Procedures (including critical care time)  Labs Reviewed  CBC WITH DIFFERENTIAL - Abnormal; Notable for the following:    RBC 3.93 (*)     Hemoglobin 11.7 (*)     HCT 35.4 (*)     Lymphocytes Relative 10 (*)     Lymphs Abs 0.6 (*)     Monocytes Relative 15 (*)     All other components within normal limits  COMPREHENSIVE METABOLIC PANEL - Abnormal; Notable for the following:    Glucose, Bld 104 (*)     Creatinine, Ser 1.47 (*)     Albumin 3.3 (*)     GFR calc non Af Amer 60 (*)     GFR calc Af Amer 70 (*)     All other components within  normal limits   No results found.   1. Influenza-like illness   2. Acute kidney injury       MDM  Non-insulin-dependent diabetic with hypertension complaining of flulike symptoms including nausea and vomiting. Chest x-ray shows no infiltrate, blood work is normal except for a elevation of his creatinine to 1.47, this is not usual for him. This is likely secondary to dehydration from the vomiting for the last 3 days. IV fluids administered and patient is tolerating by mouth at this time he reports a significant subjective improvement in his condition.  Vital signs are stable and he is appropriate for discharge at this time. Discussed case with attending who agrees with care plan and stability for discharge.   Pt verbalized understanding and agrees with care plan. Outpatient follow-up and return precautions given.    New Prescriptions   HYDROCODONE-ACETAMINOPHEN (HYCET) 7.5-325 MG/15 ML SOLUTION    Take 15 mLs by mouth every 6 (six) hours as needed for pain or cough. Maximum 90 mL per day. Do not combine with any other acetaminophen-containing solutions or tabs   PROMETHAZINE (PHENERGAN) 25 MG TABLET    Take 1 tablet (25 mg total) by mouth every 6 (six) hours as needed for nausea.      Wynetta Emery, PA-C 07/06/12 1556

## 2012-07-06 NOTE — ED Notes (Signed)
Pt reports since Sunday night he's had a cough, congestion, n/v. States he has been unable to eat or drink well over the past 2 days.

## 2012-07-07 ENCOUNTER — Emergency Department (HOSPITAL_COMMUNITY): Payer: Medicaid Other

## 2012-07-07 ENCOUNTER — Emergency Department (HOSPITAL_COMMUNITY)
Admission: EM | Admit: 2012-07-07 | Discharge: 2012-07-08 | Disposition: A | Discharge status: Home / Self Care | Payer: Medicaid Other | Attending: Emergency Medicine | Admitting: Emergency Medicine

## 2012-07-07 ENCOUNTER — Encounter (HOSPITAL_COMMUNITY): Payer: Self-pay

## 2012-07-07 DIAGNOSIS — I10 Essential (primary) hypertension: Secondary | ICD-10-CM | POA: Insufficient documentation

## 2012-07-07 DIAGNOSIS — E119 Type 2 diabetes mellitus without complications: Secondary | ICD-10-CM | POA: Insufficient documentation

## 2012-07-07 DIAGNOSIS — R05 Cough: Secondary | ICD-10-CM | POA: Insufficient documentation

## 2012-07-07 DIAGNOSIS — Z79899 Other long term (current) drug therapy: Secondary | ICD-10-CM | POA: Insufficient documentation

## 2012-07-07 DIAGNOSIS — Z8679 Personal history of other diseases of the circulatory system: Secondary | ICD-10-CM | POA: Insufficient documentation

## 2012-07-07 DIAGNOSIS — M545 Low back pain, unspecified: Secondary | ICD-10-CM | POA: Insufficient documentation

## 2012-07-07 DIAGNOSIS — R059 Cough, unspecified: Secondary | ICD-10-CM | POA: Insufficient documentation

## 2012-07-07 DIAGNOSIS — R110 Nausea: Secondary | ICD-10-CM | POA: Insufficient documentation

## 2012-07-07 DIAGNOSIS — Z794 Long term (current) use of insulin: Secondary | ICD-10-CM | POA: Insufficient documentation

## 2012-07-07 DIAGNOSIS — F17200 Nicotine dependence, unspecified, uncomplicated: Secondary | ICD-10-CM | POA: Insufficient documentation

## 2012-07-07 DIAGNOSIS — R1032 Left lower quadrant pain: Secondary | ICD-10-CM | POA: Insufficient documentation

## 2012-07-07 LAB — BASIC METABOLIC PANEL
BUN: 15 mg/dL (ref 6–23)
Chloride: 99 mEq/L (ref 96–112)
Creatinine, Ser: 1.31 mg/dL (ref 0.50–1.35)
Glucose, Bld: 79 mg/dL (ref 70–99)
Potassium: 3.9 mEq/L (ref 3.5–5.1)

## 2012-07-07 LAB — CBC
HCT: 37.5 % — ABNORMAL LOW (ref 39.0–52.0)
Hemoglobin: 12.5 g/dL — ABNORMAL LOW (ref 13.0–17.0)
MCHC: 33.3 g/dL (ref 30.0–36.0)
MCV: 89.3 fL (ref 78.0–100.0)
RDW: 13.9 % (ref 11.5–15.5)
WBC: 4.1 10*3/uL (ref 4.0–10.5)

## 2012-07-07 MED ORDER — KETOROLAC TROMETHAMINE 60 MG/2ML IM SOLN
60.0000 mg | Freq: Once | INTRAMUSCULAR | Status: AC
  Administered 2012-07-08: 60 mg via INTRAMUSCULAR
  Filled 2012-07-07: qty 2

## 2012-07-07 NOTE — ED Notes (Signed)
Patient was seen here 1 day ago dx influenza-like illness and acute kidney injury. Presents today c/o continued fevers, difficulty breathing, cough, vomiting, back pain and chest aching. Also has headache and abdominal pain. Denies chills, diarrhea or constipation.

## 2012-07-07 NOTE — ED Provider Notes (Signed)
Medical screening examination/treatment/procedure(s) were performed by non-physician practitioner and as supervising physician I was immediately available for consultation/collaboration.   Gwyneth Sprout, MD 07/07/12 1011

## 2012-07-07 NOTE — ED Provider Notes (Signed)
History     CSN: [id number]  Arrival date & time 07/07/12  1804   First MD Initiated Contact with Patient 07/07/12 2325      Chief Complaint  Patient presents with  . URI  . Cough    (Consider location/radiation/quality/duration/timing/severity/associated sxs/prior treatment) HPI Comments: 37 year old male who was seen here in the last 24-48 hours for a flulike illness presents with a complaint of ongoing cough, lower back pain, left lower quadrant abdominal pain and nausea. These are similar symptoms to what he has had recently, he was evaluated yesterday as well as today with some blood work and today had a chest x-ray none of which showed a definite etiology of his symptoms. His symptoms are persistent, nothing seems to make them better or worse but he did improve temporarily with medications given at his prior ER visit. He denies associated diarrhea, rashes, swelling. He does admit to being a diabetic and having high blood pressure.  Patient is a 37 y.o. male presenting with URI and cough. The history is provided by the patient and medical records.  URI The primary symptoms include cough.  Cough    Past Medical History  Diagnosis Date  . Hypertension   . Diabetes mellitus     type 2 iddm x 18 yrs  . Vascular disease     poor circulation to left foot    Past Surgical History  Procedure Date  . Cholecystectomy   . Toe amputation 2012    left foot; great toe and second toe  . Amputation 04/15/2011    Procedure: AMPUTATION RAY;  Surgeon: Nadara Mustard, MD;  Location: [facility] OR;  Service: Orthopedics;  Laterality: Left;  left foot third toe amputation and MPP joint and gastroc resection VS. achilles lengthing   . Amputation 04/25/2011    Procedure: AMPUTATION DIGIT;  Surgeon: Nadara Mustard, MD;  Location: [facility] OR;  Service: Orthopedics;  Laterality: Left;  Left foot 3rd toe amputation MTP joint, Gastroc Recession  Achilles Lengthening     No family history on file.  History   Substance Use Topics  . Smoking status: Current Every Day Smoker -- 0.2 packs/day for 1 years    Types: Cigarettes  . Smokeless tobacco: Never Used  . Alcohol Use: No      Review of Systems  Respiratory: Positive for cough.   All other systems reviewed and are negative.    Allergies  Review of patient's allergies indicates no known allergies.  Home Medications   Current Outpatient Rx  Name  Route  Sig  Dispense  Refill  . HYDROCODONE-ACETAMINOPHEN 7.5-325 MG/15ML PO SOLN   Oral   Take 15 mLs by mouth every 6 (six) hours as needed for pain or cough. Maximum 90 mL per day. Do not combine with any other acetaminophen-containing solutions or tabs   120 mL   0   . LEVEMIR FLEXPEN [place]   Subcutaneous   Inject 20 Units into the skin at bedtime. Based on CBGs         . LISINOPRIL 20 MG PO TABS   Oral   Take 20 mg by mouth daily.          [name] METFORMIN HCL 1000 MG PO TABS   Oral   Take 500 mg by mouth daily with breakfast.          . PROMETHAZINE HCL 25 MG PO TABS   Oral   Take 1 tablet (25 mg total) by mouth every 6 (six)  hours as needed for nausea.   12 tablet   0     BP 147/88  Pulse 81  Temp 99.1 F (37.3 C) (Oral)  Resp 18  SpO2 99%  Physical Exam  Nursing note and vitals reviewed. Constitutional: He appears well-developed and well-nourished. No distress.  HENT:  Head: Normocephalic and atraumatic.  Mouth/Throat: Oropharynx is clear and moist. No oropharyngeal exudate.       Oropharynx is clear, no exudate asymmetry or hypertrophy.  Eyes: Conjunctivae normal and EOM are normal. Pupils are equal, round, and reactive to light. Right eye exhibits no discharge. Left eye exhibits no discharge. No scleral icterus.  Neck: Normal range of motion. Neck supple. No JVD present. No thyromegaly present.  Cardiovascular: Normal rate, regular rhythm, normal heart sounds and intact distal pulses.  Exam reveals no gallop and no friction rub.   No murmur  heard. Pulmonary/Chest: Effort normal and breath sounds normal. No respiratory distress. He has no wheezes. He has no rales.  Abdominal: Soft. Bowel sounds are normal. He exhibits no distension and no mass. There is tenderness ( Mild suprapubic and left lower quadrant tenderness, no guarding, no rebound, no peritoneal signs, no pain at McBurney's point).  Musculoskeletal: Normal range of motion. He exhibits no edema and no tenderness.  Lymphadenopathy:    He has no cervical adenopathy.  Neurological: He is alert. Coordination normal.  Skin: Skin is warm and dry. No rash noted. No erythema.  Psychiatric: He has a normal mood and affect. His behavior is normal.    ED Course  Procedures (including critical care time)  Labs Reviewed  CBC - Abnormal; Notable for the following:    RBC 4.20 (*)     Hemoglobin 12.5 (*)     HCT 37.5 (*)     All other components within normal limits  BASIC METABOLIC PANEL - Abnormal; Notable for the following:    GFR calc non Af Amer 69 (*)     GFR calc Af Amer 80 (*)     All other components within normal limits  URINALYSIS, ROUTINE W REFLEX MICROSCOPIC   Dg Chest 2 View  07/07/2012  *RADIOLOGY REPORT*  Clinical Data: Fever.  Short of breath.  Cough.  CHEST - 2 VIEW  Comparison: 07/06/2012  Findings: Heart size is normal.  Mediastinal shadows are normal. The lungs are clear.  No effusions.  There may be mild central bronchial thickening.  IMPRESSION: No infiltrate or collapse.  Possible bronchial thickening.   Original Report Authenticated By: Paulina Fusi, [name]    Dg Chest 2 View  07/06/2012  *RADIOLOGY REPORT*  Clinical Data: Cough, fever  CHEST - 2 VIEW  Comparison: 06/15/2011  Findings: Cardiomediastinal silhouette is stable.  No acute infiltrate or pleural effusion.  No pulmonary edema.  Central mild bronchitic changes.  IMPRESSION: No acute infiltrate or pulmonary edema.  Central mild bronchitic changes.   Original Report Authenticated By: Natasha Mead, [name]       1. Influenza-like illness       MDM  The patient appears mildly uncomfortable, has lower abdominal tenderness, laboratory data today shows that the patient has a improving creatinine from his prior visits from 1.5-1.3. His other labs for lites are normal, he does not have leukocytosis and his chest x-ray is normal.  PA and lateral views of the chest were obtained by digital radiography. I have personally interpreted these x-rays and find her to be no signs of pulmonary infiltrate, cardiomegaly, subdiaphragmatic free air, soft tissue  abnormality, no obvious bony abnormalities or fractures.  His abdomen does have some tenderness, I would consider urinary tract infection as well as possible diverticulitis though this could be related to his coughing and abdominal wall strain. He does have a positive Carnett's sign.  UA pending, Toradol for pain.   UA negative for infection though he has some ketones.  Pt given pain meds, feels better, no other acute findings.  Pt stable for d/c.   Vida Roller, MD 07/08/12 [phone]

## 2012-07-08 LAB — URINALYSIS, ROUTINE W REFLEX MICROSCOPIC
Bilirubin Urine: NEGATIVE
Glucose, UA: NEGATIVE mg/dL
Ketones, ur: 40 mg/dL — AB
Specific Gravity, Urine: 1.027 (ref 1.005–1.030)
pH: 5.5 (ref 5.0–8.0)

## 2012-07-08 LAB — URINE MICROSCOPIC-ADD ON

## 2012-07-08 MED ORDER — TRAMADOL HCL 50 MG PO TABS: 50.0000 mg | ORAL_TABLET | Freq: Four times a day (QID) | ORAL | Status: DC | PRN

## 2012-07-08 MED ORDER — NAPROXEN 500 MG PO TABS: 500.0000 mg | ORAL_TABLET | Freq: Two times a day (BID) | ORAL | Status: DC

## 2012-07-08 NOTE — ED Notes (Signed)
Pt discharged.Vital signs stable and GCS 15 

## 2012-09-14 ENCOUNTER — Emergency Department (HOSPITAL_COMMUNITY)
Admission: EM | Admit: 2012-09-14 | Discharge: 2012-09-14 | Disposition: A | Discharge status: Home / Self Care | Payer: Medicaid Other | Attending: Emergency Medicine | Admitting: Emergency Medicine

## 2012-09-14 ENCOUNTER — Encounter (HOSPITAL_COMMUNITY): Payer: Self-pay | Admitting: Emergency Medicine

## 2012-09-14 ENCOUNTER — Emergency Department (HOSPITAL_COMMUNITY): Payer: Medicaid Other

## 2012-09-14 DIAGNOSIS — R112 Nausea with vomiting, unspecified: Secondary | ICD-10-CM | POA: Insufficient documentation

## 2012-09-14 DIAGNOSIS — B349 Viral infection, unspecified: Secondary | ICD-10-CM

## 2012-09-14 DIAGNOSIS — I10 Essential (primary) hypertension: Secondary | ICD-10-CM | POA: Insufficient documentation

## 2012-09-14 DIAGNOSIS — B9789 Other viral agents as the cause of diseases classified elsewhere: Secondary | ICD-10-CM | POA: Insufficient documentation

## 2012-09-14 DIAGNOSIS — Z8679 Personal history of other diseases of the circulatory system: Secondary | ICD-10-CM | POA: Insufficient documentation

## 2012-09-14 DIAGNOSIS — R059 Cough, unspecified: Secondary | ICD-10-CM | POA: Insufficient documentation

## 2012-09-14 DIAGNOSIS — F17200 Nicotine dependence, unspecified, uncomplicated: Secondary | ICD-10-CM | POA: Insufficient documentation

## 2012-09-14 DIAGNOSIS — R52 Pain, unspecified: Secondary | ICD-10-CM | POA: Insufficient documentation

## 2012-09-14 DIAGNOSIS — E119 Type 2 diabetes mellitus without complications: Secondary | ICD-10-CM | POA: Insufficient documentation

## 2012-09-14 DIAGNOSIS — R05 Cough: Secondary | ICD-10-CM | POA: Insufficient documentation

## 2012-09-14 DIAGNOSIS — Z794 Long term (current) use of insulin: Secondary | ICD-10-CM | POA: Insufficient documentation

## 2012-09-14 DIAGNOSIS — Z79899 Other long term (current) drug therapy: Secondary | ICD-10-CM | POA: Insufficient documentation

## 2012-09-14 DIAGNOSIS — R509 Fever, unspecified: Secondary | ICD-10-CM | POA: Insufficient documentation

## 2012-09-14 LAB — POCT I-STAT, CHEM 8
Calcium, Ion: 1.21 mmol/L (ref 1.12–1.23)
Glucose, Bld: 109 mg/dL — ABNORMAL HIGH (ref 70–99)
HCT: 41 % (ref 39.0–52.0)
Hemoglobin: 13.9 g/dL (ref 13.0–17.0)
Potassium: 4.2 mEq/L (ref 3.5–5.1)

## 2012-09-14 LAB — CBC WITH DIFFERENTIAL/PLATELET
Basophils Absolute: 0 10*3/uL (ref 0.0–0.1)
Eosinophils Relative: 2 % (ref 0–5)
Lymphocytes Relative: 11 % — ABNORMAL LOW (ref 12–46)
MCV: 87.8 fL (ref 78.0–100.0)
Neutro Abs: 11.5 10*3/uL — ABNORMAL HIGH (ref 1.7–7.7)
Neutrophils Relative %: 79 % — ABNORMAL HIGH (ref 43–77)
Platelets: 214 10*3/uL (ref 150–400)
RBC: 4.34 MIL/uL (ref 4.22–5.81)
RDW: 14 % (ref 11.5–15.5)
WBC: 14.5 10*3/uL — ABNORMAL HIGH (ref 4.0–10.5)

## 2012-09-14 LAB — URINALYSIS, ROUTINE W REFLEX MICROSCOPIC
Bilirubin Urine: NEGATIVE
Leukocytes, UA: NEGATIVE
Nitrite: NEGATIVE
Specific Gravity, Urine: 1.021 (ref 1.005–1.030)
Urobilinogen, UA: 1 mg/dL (ref 0.0–1.0)

## 2012-09-14 LAB — URINE MICROSCOPIC-ADD ON

## 2012-09-14 LAB — LIPASE, BLOOD: Lipase: 19 U/L (ref 11–59)

## 2012-09-14 MED ORDER — ONDANSETRON HCL 4 MG PO TABS: 4.0000 mg | ORAL_TABLET | Freq: Three times a day (TID) | ORAL | Status: DC | PRN

## 2012-09-14 MED ORDER — IOHEXOL 300 MG/ML  SOLN
100.0000 mL | Freq: Once | INTRAMUSCULAR | Status: AC | PRN
  Administered 2012-09-14: 80 mL via INTRAVENOUS

## 2012-09-14 MED ORDER — MORPHINE SULFATE 4 MG/ML IJ SOLN
4.0000 mg | Freq: Once | INTRAMUSCULAR | Status: AC
  Administered 2012-09-14: 4 mg via INTRAVENOUS
  Filled 2012-09-14: qty 1

## 2012-09-14 MED ORDER — METFORMIN HCL 500 MG PO TABS: 500.0000 mg | ORAL_TABLET | Freq: Two times a day (BID) | ORAL | Status: DC

## 2012-09-14 MED ORDER — ONDANSETRON HCL 4 MG/2ML IJ SOLN
4.0000 mg | Freq: Once | INTRAMUSCULAR | Status: AC
  Administered 2012-09-14: 4 mg via INTRAVENOUS
  Filled 2012-09-14: qty 2

## 2012-09-14 MED ORDER — SODIUM CHLORIDE 0.9 % IV BOLUS (SEPSIS)
1000.0000 mL | Freq: Once | INTRAVENOUS | Status: AC
  Administered 2012-09-14: 1000 mL via INTRAVENOUS

## 2012-09-14 MED ORDER — LISINOPRIL 10 MG PO TABS: 20.0000 mg | ORAL_TABLET | Freq: Every day | ORAL | Status: DC

## 2012-09-14 MED ORDER — IOHEXOL 300 MG/ML  SOLN
25.0000 mL | INTRAMUSCULAR | Status: AC
  Administered 2012-09-14: 25 mL via ORAL

## 2012-09-14 NOTE — ED Provider Notes (Signed)
Medical screening examination/treatment/procedure(s) were performed by non-physician practitioner and as supervising physician I was immediately available for consultation/collaboration.  Dameshia Seybold R. Shirleyann Montero, MD 09/14/12 1545 

## 2012-09-14 NOTE — ED Notes (Signed)
Patient aware that we need urine specimen.  Patient stated he couldn't go right now.

## 2012-09-14 NOTE — ED Provider Notes (Signed)
History     CSN: [id number]  Arrival date & time 09/14/12  5621   First MD Initiated Contact with Patient 09/14/12 1034      Chief Complaint  Patient presents with  . Abdominal Pain    (Consider location/radiation/quality/duration/timing/severity/associated sxs/prior treatment) HPI Comments: Patient presents to the emergency department with chief complaint of abdominal pain. Patient states the pain began 1-2 days ago. He also endorses generalized body aches, cough and low-grade fever. He states that he has had nausea and vomiting, but no diarrhea. He has not tried taking anything to alleviate his symptoms. He states his pain is 8/10. Nothing makes it worse or better.  The history is provided by the patient. No language interpreter was used.    Past Medical History  Diagnosis Date  . Hypertension   . Diabetes mellitus     type 2 iddm x 18 yrs  . Vascular disease     poor circulation to left foot    Past Surgical History  Procedure Laterality Date  . Cholecystectomy    . Toe amputation  2012    left foot; great toe and second toe  . Amputation  04/15/2011    Procedure: AMPUTATION RAY;  Surgeon: Nadara Mustard, MD;  Location: [facility] OR;  Service: Orthopedics;  Laterality: Left;  left foot third toe amputation and MPP joint and gastroc resection VS. achilles lengthing   . Amputation  04/25/2011    Procedure: AMPUTATION DIGIT;  Surgeon: Nadara Mustard, MD;  Location: [facility] OR;  Service: Orthopedics;  Laterality: Left;  Left foot 3rd toe amputation MTP joint, Gastroc Recession  Achilles Lengthening     History reviewed. No pertinent family history.  History  Substance Use Topics  . Smoking status: Current Every Day Smoker -- 0.25 packs/day for 1 years    Types: Cigarettes  . Smokeless tobacco: Never Used  . Alcohol Use: No      Review of Systems  All other systems reviewed and are negative.    Allergies  Review of patient's allergies indicates no known allergies.  Home  Medications   Current Outpatient Rx  Name  Route  Sig  Dispense  Refill  . Insulin Detemir (LEVEMIR FLEXPEN [place])   Subcutaneous   Inject 20 Units into the skin at bedtime. Based on CBGs         . lisinopril (PRINIVIL,ZESTRIL) 20 MG tablet   Oral   Take 20 mg by mouth daily.          . metFORMIN (GLUCOPHAGE) 500 MG tablet   Oral   Take 500 mg by mouth daily with breakfast.           BP 153/93  Pulse 81  Temp(Src) 98.1 F (36.7 C) (Oral)  Resp 18  SpO2 98%  Physical Exam  Nursing note and vitals reviewed. Constitutional: He is oriented to person, place, and time. He appears well-developed and well-nourished.  HENT:  Head: Normocephalic and atraumatic.  Eyes: Conjunctivae and EOM are normal. Pupils are equal, round, and reactive to light. Right eye exhibits no discharge. Left eye exhibits no discharge. No scleral icterus.  Neck: Normal range of motion. Neck supple. No JVD present.  Cardiovascular: Normal rate, regular rhythm, normal heart sounds and intact distal pulses.  Exam reveals no gallop and no friction rub.   No murmur heard. Pulmonary/Chest: Effort normal and breath sounds normal. No respiratory distress. He has no wheezes. He has no rales. He exhibits no tenderness.  Abdominal: Soft. He  exhibits no distension and no mass. There is tenderness. There is no rebound and no guarding.  Right lower quadrant pain, without rebound tenderness, no right upper quadrant tenderness, no left lower quadrant tenderness. No fluid wave or sides peritonitis.  Musculoskeletal: Normal range of motion. He exhibits no edema and no tenderness.  Neurological: He is alert and oriented to person, place, and time.  CN 3-12 intact  Skin: Skin is warm and dry.  Psychiatric: He has a normal mood and affect. His behavior is normal. Judgment and thought content normal.    ED Course  Procedures (including critical care time)  Labs Reviewed  CBC WITH DIFFERENTIAL - Abnormal; Notable for the  following:    WBC 14.5 (*)    HCT 38.1 (*)    Neutrophils Relative 79 (*)    Neutro Abs 11.5 (*)    Lymphocytes Relative 11 (*)    Monocytes Absolute 1.1 (*)    All other components within normal limits  LIPASE, BLOOD  URINALYSIS, ROUTINE W REFLEX MICROSCOPIC   Results for orders placed during the hospital encounter of 09/14/12  CBC WITH DIFFERENTIAL      Result Value Range   WBC 14.5 (*) 4.0 - 10.5 K/uL   RBC 4.34  4.22 - 5.81 MIL/uL   Hemoglobin 13.0  13.0 - 17.0 g/dL   HCT 16.1 (*) 09.6 - 04.5 %   MCV 87.8  78.0 - 100.0 fL   MCH 30.0  26.0 - 34.0 pg   MCHC 34.1  30.0 - 36.0 g/dL   RDW 40.9  81.1 - 91.4 %   Platelets 214  150 - 400 K/uL   Neutrophils Relative 79 (*) 43 - 77 %   Neutro Abs 11.5 (*) 1.7 - 7.7 K/uL   Lymphocytes Relative 11 (*) 12 - 46 %   Lymphs Abs 1.6  0.7 - 4.0 K/uL   Monocytes Relative 8  3 - 12 %   Monocytes Absolute 1.1 (*) 0.1 - 1.0 K/uL   Eosinophils Relative 2  0 - 5 %   Eosinophils Absolute 0.3  0.0 - 0.7 K/uL   Basophils Relative 0  0 - 1 %   Basophils Absolute 0.0  0.0 - 0.1 K/uL  LIPASE, BLOOD      Result Value Range   Lipase 19  11 - 59 U/L  URINALYSIS, ROUTINE W REFLEX MICROSCOPIC      Result Value Range   Color, Urine YELLOW  YELLOW   APPearance CLEAR  CLEAR   Specific Gravity, Urine 1.021  1.005 - 1.030   pH 6.5  5.0 - 8.0   Glucose, UA NEGATIVE  NEGATIVE mg/dL   Hgb urine dipstick TRACE (*) NEGATIVE   Bilirubin Urine NEGATIVE  NEGATIVE   Ketones, ur NEGATIVE  NEGATIVE mg/dL   Protein, ur >782 (*) NEGATIVE mg/dL   Urobilinogen, UA 1.0  0.0 - 1.0 mg/dL   Nitrite NEGATIVE  NEGATIVE   Leukocytes, UA NEGATIVE  NEGATIVE  URINE MICROSCOPIC-ADD ON      Result Value Range   Squamous Epithelial / LPF RARE  RARE   WBC, UA 0-2  <3 WBC/hpf   RBC / HPF 0-2  <3 RBC/hpf   Bacteria, UA RARE  RARE   Casts GRANULAR CAST (*) NEGATIVE  POCT I-STAT, CHEM 8      Result Value Range   Sodium 142  135 - 145 mEq/L   Potassium 4.2  3.5 - 5.1 mEq/L    Chloride 104  96 -  112 mEq/L   BUN 15  6 - 23 mg/dL   Creatinine, Ser 1.61 (*) 0.50 - 1.35 mg/dL   Glucose, Bld 096 (*) 70 - 99 mg/dL   Calcium, Ion 0.45  4.09 - 1.23 mmol/L   TCO2 34  0 - 100 mmol/L   Hemoglobin 13.9  13.0 - 17.0 g/dL   HCT 81.1  91.4 - 78.2 %   Ct Abdomen Pelvis W Contrast  09/14/2012  *RADIOLOGY REPORT*  Clinical Data: Right lower quadrant abdominal pain.  CT ABDOMEN AND PELVIS WITH CONTRAST  Technique:  Multidetector CT imaging of the abdomen and pelvis was performed following the standard protocol during bolus administration of intravenous contrast.  Contrast: 80mL OMNIPAQUE IOHEXOL 300 MG/ML  SOLN  Comparison: 12/08/2011  Findings: Subtle ground-glass densities at the lung bases could represent mild air trapping or atelectasis.  There is no evidence for free intraperitoneal air.  Normal appearance of the liver and portal venous system.  The gallbladder has been removed.  Normal appearance of the spleen, pancreas, left kidney and adrenal tissue.  The right kidney is malrotated but no evidence for hydronephrosis.  The appendix has a normal appearance.  There is no inflammation around the appendix.  Normal appearance of the prostate, seminal vesicles and urinary bladder.  Again seen is a prominent lymph node in the left external iliac region that measures 1.1 cm but not significantly changed. Overall, there is no significant free fluid or lymphadenopathy.  No acute bony abnormality.  There is vacuum disc phenomenon in the L4-L5 and L5-S1.  IMPRESSION: No acute abnormalities within the abdomen or pelvis.   Original Report Authenticated By: Richarda Overlie, [name]       1. Viral illness       MDM  Patient with right lower quadrant abdominal pain. He has definite right lower quadrant tenderness to palpation, concern for appendicitis, will order CT scan to rule out. He also has some other generalized body aches, nausea, and vomiting. Will give fluids, check basic labs, and will  reassess.   Patient still feeling sick after Zofran, and states that the pain medicine helped. He vomited his first cup of contrast. Will give more, and encourage you take small sips.   3:19 PM CT is negative.  Patient states that he is feeling better on recheck.  He is now tolerating PO.  VSS.  Patient has follow-up with [facility] at [facility].  Will discharge the patient to home.  He understands and agrees with the plan.  Return precautions discussed.      Roxy Horseman, PA-C 09/14/12 1529

## 2012-09-14 NOTE — ED Notes (Signed)
Pt reports 3 day hx of cough, bodyaches, low grade fever. Yesterday with onset of nausea, vomiting. Pt reports pain located in lower back. Alert, oriented, NAD, MAE.

## 2012-09-14 NOTE — ED Notes (Signed)
Urinal supplied to patient.  Patient states he is unable to void at this time.

## 2012-09-16 ENCOUNTER — Inpatient Hospital Stay (HOSPITAL_COMMUNITY): Payer: Medicaid Other

## 2012-09-16 ENCOUNTER — Inpatient Hospital Stay (HOSPITAL_COMMUNITY)
Admission: EM | Admit: 2012-09-16 | Discharge: 2012-09-17 | DRG: 440 | Disposition: A | Discharge status: Home / Self Care | Payer: Medicaid Other | Attending: Internal Medicine | Admitting: Internal Medicine

## 2012-09-16 ENCOUNTER — Encounter (HOSPITAL_COMMUNITY): Payer: Self-pay | Admitting: Emergency Medicine

## 2012-09-16 DIAGNOSIS — K859 Acute pancreatitis without necrosis or infection, unspecified: Principal | ICD-10-CM | POA: Diagnosis present

## 2012-09-16 DIAGNOSIS — I999 Unspecified disorder of circulatory system: Secondary | ICD-10-CM | POA: Diagnosis present

## 2012-09-16 DIAGNOSIS — Z9089 Acquired absence of other organs: Secondary | ICD-10-CM

## 2012-09-16 DIAGNOSIS — I10 Essential (primary) hypertension: Secondary | ICD-10-CM | POA: Diagnosis present

## 2012-09-16 DIAGNOSIS — S98139A Complete traumatic amputation of one unspecified lesser toe, initial encounter: Secondary | ICD-10-CM

## 2012-09-16 DIAGNOSIS — R112 Nausea with vomiting, unspecified: Secondary | ICD-10-CM | POA: Diagnosis present

## 2012-09-16 DIAGNOSIS — K5289 Other specified noninfective gastroenteritis and colitis: Secondary | ICD-10-CM | POA: Diagnosis present

## 2012-09-16 DIAGNOSIS — F17200 Nicotine dependence, unspecified, uncomplicated: Secondary | ICD-10-CM | POA: Diagnosis present

## 2012-09-16 DIAGNOSIS — E119 Type 2 diabetes mellitus without complications: Secondary | ICD-10-CM | POA: Diagnosis present

## 2012-09-16 DIAGNOSIS — Z794 Long term (current) use of insulin: Secondary | ICD-10-CM

## 2012-09-16 DIAGNOSIS — R109 Unspecified abdominal pain: Secondary | ICD-10-CM

## 2012-09-16 DIAGNOSIS — A084 Viral intestinal infection, unspecified: Secondary | ICD-10-CM | POA: Diagnosis present

## 2012-09-16 DIAGNOSIS — R05 Cough: Secondary | ICD-10-CM

## 2012-09-16 DIAGNOSIS — K5900 Constipation, unspecified: Secondary | ICD-10-CM | POA: Diagnosis present

## 2012-09-16 HISTORY — DX: Other acute osteomyelitis, unspecified ankle and foot: M86.179

## 2012-09-16 LAB — CBC WITH DIFFERENTIAL/PLATELET
Basophils Relative: 0 % (ref 0–1)
Eosinophils Absolute: 0.3 10*3/uL (ref 0.0–0.7)
HCT: 37.3 % — ABNORMAL LOW (ref 39.0–52.0)
Hemoglobin: 13 g/dL (ref 13.0–17.0)
Lymphs Abs: 1.4 10*3/uL (ref 0.7–4.0)
MCH: 30.2 pg (ref 26.0–34.0)
MCHC: 34.9 g/dL (ref 30.0–36.0)
Monocytes Absolute: 0.5 10*3/uL (ref 0.1–1.0)
Monocytes Relative: 5 % (ref 3–12)
Neutrophils Relative %: 77 % (ref 43–77)
RBC: 4.3 MIL/uL (ref 4.22–5.81)

## 2012-09-16 LAB — COMPREHENSIVE METABOLIC PANEL
Alkaline Phosphatase: 88 U/L (ref 39–117)
BUN: 19 mg/dL (ref 6–23)
Chloride: 103 mEq/L (ref 96–112)
Creatinine, Ser: 1.33 mg/dL (ref 0.50–1.35)
GFR calc Af Amer: 79 mL/min — ABNORMAL LOW (ref >90)
Glucose, Bld: 134 mg/dL — ABNORMAL HIGH (ref 70–99)
Potassium: 3.8 mEq/L (ref 3.5–5.1)
Total Bilirubin: 0.2 mg/dL — ABNORMAL LOW (ref 0.3–1.2)
Total Protein: 8 g/dL (ref 6.0–8.3)

## 2012-09-16 LAB — GLUCOSE, CAPILLARY
Glucose-Capillary: 103 mg/dL — ABNORMAL HIGH (ref 70–99)
Glucose-Capillary: 128 mg/dL — ABNORMAL HIGH (ref 70–99)
Glucose-Capillary: 130 mg/dL — ABNORMAL HIGH (ref 70–99)

## 2012-09-16 LAB — RAPID URINE DRUG SCREEN, HOSP PERFORMED: Barbiturates: NOT DETECTED

## 2012-09-16 LAB — LIPASE, BLOOD: Lipase: 140 U/L — ABNORMAL HIGH (ref 11–59)

## 2012-09-16 LAB — HEMOGLOBIN A1C: Hgb A1c MFr Bld: 5.6 % (ref <5.7)

## 2012-09-16 LAB — HIV ANTIBODY (ROUTINE TESTING W REFLEX): HIV: NONREACTIVE

## 2012-09-16 MED ORDER — METOPROLOL TARTRATE 1 MG/ML IV SOLN
5.0000 mg | Freq: Four times a day (QID) | INTRAVENOUS | Status: DC | PRN
  Administered 2012-09-16 (×2): 5 mg via INTRAVENOUS
  Filled 2012-09-16 (×2): qty 5

## 2012-09-16 MED ORDER — ACETAMINOPHEN 650 MG RE SUPP: 650.0000 mg | Freq: Four times a day (QID) | RECTAL | Status: DC | PRN

## 2012-09-16 MED ORDER — SODIUM CHLORIDE 0.9 % IV BOLUS (SEPSIS)
1000.0000 mL | Freq: Once | INTRAVENOUS | Status: AC
  Administered 2012-09-16: 1000 mL via INTRAVENOUS

## 2012-09-16 MED ORDER — ONDANSETRON HCL 4 MG/2ML IJ SOLN
4.0000 mg | Freq: Once | INTRAMUSCULAR | Status: AC
  Administered 2012-09-16: 4 mg via INTRAVENOUS
  Filled 2012-09-16: qty 2

## 2012-09-16 MED ORDER — FENTANYL CITRATE 0.05 MG/ML IJ SOLN
50.0000 ug | Freq: Once | INTRAMUSCULAR | Status: AC
  Administered 2012-09-16: 50 ug via INTRAVENOUS
  Filled 2012-09-16: qty 2

## 2012-09-16 MED ORDER — SODIUM CHLORIDE 0.9 % IV SOLN
INTRAVENOUS | Status: DC
  Administered 2012-09-16 – 2012-09-17 (×4): via INTRAVENOUS

## 2012-09-16 MED ORDER — PANTOPRAZOLE SODIUM 40 MG IV SOLR
40.0000 mg | Freq: Every day | INTRAVENOUS | Status: DC
  Administered 2012-09-16 – 2012-09-17 (×2): 40 mg via INTRAVENOUS
  Filled 2012-09-16 (×2): qty 40

## 2012-09-16 MED ORDER — ONDANSETRON HCL 4 MG/2ML IJ SOLN
4.0000 mg | Freq: Four times a day (QID) | INTRAMUSCULAR | Status: DC | PRN
  Administered 2012-09-16: 4 mg via INTRAVENOUS
  Filled 2012-09-16: qty 2

## 2012-09-16 MED ORDER — PROMETHAZINE HCL 25 MG/ML IJ SOLN
12.5000 mg | Freq: Four times a day (QID) | INTRAMUSCULAR | Status: DC | PRN
  Filled 2012-09-16: qty 1

## 2012-09-16 MED ORDER — ACETAMINOPHEN 325 MG PO TABS
650.0000 mg | ORAL_TABLET | Freq: Four times a day (QID) | ORAL | Status: DC | PRN
  Administered 2012-09-17: 650 mg via ORAL
  Filled 2012-09-16: qty 2

## 2012-09-16 MED ORDER — MORPHINE SULFATE 2 MG/ML IJ SOLN
2.0000 mg | INTRAMUSCULAR | Status: DC | PRN
  Administered 2012-09-16: 2 mg via INTRAVENOUS
  Filled 2012-09-16: qty 1

## 2012-09-16 MED ORDER — ONDANSETRON HCL 4 MG PO TABS: 4.0000 mg | ORAL_TABLET | Freq: Four times a day (QID) | ORAL | Status: DC | PRN

## 2012-09-16 MED ORDER — PROMETHAZINE HCL 25 MG/ML IJ SOLN
25.0000 mg | Freq: Once | INTRAMUSCULAR | Status: AC
  Administered 2012-09-16: 25 mg via INTRAVENOUS
  Filled 2012-09-16 (×2): qty 1

## 2012-09-16 MED ORDER — ENOXAPARIN SODIUM 40 MG/0.4ML ~~LOC~~ SOLN
40.0000 mg | SUBCUTANEOUS | Status: DC
  Filled 2012-09-16 (×2): qty 0.4

## 2012-09-16 MED ORDER — MORPHINE SULFATE 2 MG/ML IJ SOLN
2.0000 mg | INTRAMUSCULAR | Status: DC | PRN
  Administered 2012-09-16 (×2): 2 mg via INTRAVENOUS
  Filled 2012-09-16 (×2): qty 1

## 2012-09-16 NOTE — ED Notes (Signed)
Pt BIB EMS for c/o abd pain, n/v x4 days. Pt seen in ED yesterday given rx that pt did NOT get filled. Vomiting started again this AM and pt called EMS.

## 2012-09-16 NOTE — ED Notes (Signed)
Paged Admit Doctor for addition pain medication order.  Spoke with Company secretary for status on bed placement. Will call back with updated status.

## 2012-09-16 NOTE — ED Notes (Signed)
Dr.Lockwood in room at this time. Pt reports lower mid abd pain.

## 2012-09-16 NOTE — ED Provider Notes (Signed)
History     CSN: [id number]  Arrival date & time 09/16/12  4696   First MD Initiated Contact with Patient 09/16/12 [phone]      Chief Complaint  Patient presents with  . Emesis  . Abdominal Pain    HPI  Patient presents today as after an initial evaluation for abdominal pain, now with continued abdominal pain, continued nausea and vomiting. He states that his symptoms actually began several days prior to that initial evaluation .  Initially they were briefly better while in the emergency department, when he received fluids, analgesics, antiemetics.  Since discharge symptoms have been persistent, with mid abdominal pain, crampy, intermittent.  There is still nausea, and the patient hasn't had recent episodes of emesis. No diarrhea, fever objectively.  The patient does complain of fever and chills subjectively. No dyspnea, no chest pain. The patient adds that there are multiple people with similar symptoms in his household.   Past Medical History  Diagnosis Date  . Hypertension   . Diabetes mellitus     type 2 iddm x 18 yrs  . Vascular disease     poor circulation to left foot    Past Surgical History  Procedure Laterality Date  . Cholecystectomy    . Toe amputation  2012    left foot; great toe and second toe  . Amputation  04/15/2011    Procedure: AMPUTATION RAY;  Surgeon: Nadara Mustard, MD;  Location: [facility] OR;  Service: Orthopedics;  Laterality: Left;  left foot third toe amputation and MPP joint and gastroc resection VS. achilles lengthing   . Amputation  04/25/2011    Procedure: AMPUTATION DIGIT;  Surgeon: Nadara Mustard, MD;  Location: [facility] OR;  Service: Orthopedics;  Laterality: Left;  Left foot 3rd toe amputation MTP joint, Gastroc Recession  Achilles Lengthening     History reviewed. No pertinent family history.  History  Substance Use Topics  . Smoking status: Current Every Day Smoker -- 0.25 packs/day for 1 years    Types: Cigarettes  . Smokeless tobacco: Never Used   . Alcohol Use: No      Review of Systems  Constitutional:       Per HPI, otherwise negative  HENT:       Per HPI, otherwise negative  Respiratory:       Per HPI, otherwise negative  Cardiovascular:       Per HPI, otherwise negative  Gastrointestinal: Positive for nausea, vomiting and abdominal pain. Negative for diarrhea.  Endocrine:       Negative aside from HPI  Genitourinary:       Neg aside from HPI   Musculoskeletal:       Per HPI, otherwise negative  Skin: Negative.   Neurological: Negative for syncope.    Allergies  Review of patient's allergies indicates no known allergies.  Home Medications   Current Outpatient Rx  Name  Route  Sig  Dispense  Refill  . Insulin Detemir (LEVEMIR FLEXPEN [place])   Subcutaneous   Inject 20 Units into the skin at bedtime. Based on CBGs         . lisinopril (PRINIVIL,ZESTRIL) 10 MG tablet   Oral   Take 2 tablets (20 mg total) by mouth daily.   14 tablet   0   . metFORMIN (GLUCOPHAGE) 500 MG tablet   Oral   Take 1 tablet (500 mg total) by mouth 2 (two) times daily with a meal.   14 tablet   0   .  ondansetron (ZOFRAN) 4 MG tablet   Oral   Take 4 mg by mouth every 8 (eight) hours as needed for nausea. Nausea/vomiting           BP 189/104  Pulse 70  Temp(Src) 97.7 F (36.5 C)  Resp 19  Wt 235 lb (106.595 kg)  BMI 31.01 kg/m2  SpO2 95%  Physical Exam  Nursing note and vitals reviewed. Constitutional: He is oriented to person, place, and time. He appears well-developed. No distress.  HENT:  Head: Normocephalic and atraumatic.  Eyes: Conjunctivae and EOM are normal.  Cardiovascular: Normal rate and regular rhythm.   Pulmonary/Chest: Effort normal. No stridor. No respiratory distress.  Abdominal: Soft. Normal appearance and bowel sounds are normal. He exhibits no distension. There is no hepatosplenomegaly. There is tenderness in the epigastric area, periumbilical area and suprapubic area. There is no rigidity, no  rebound, no guarding and no CVA tenderness.  Musculoskeletal: He exhibits no edema.  Neurological: He is alert and oriented to person, place, and time.  Skin: Skin is warm and dry.  Psychiatric: He has a normal mood and affect.    ED Course  Procedures (including critical care time)  Labs Reviewed  CBC WITH DIFFERENTIAL - Abnormal; Notable for the following:    HCT 37.3 (*)    All other components within normal limits  GLUCOSE, CAPILLARY - Abnormal; Notable for the following:    Glucose-Capillary 130 (*)    All other components within normal limits  COMPREHENSIVE METABOLIC PANEL  LIPASE, BLOOD   Ct Abdomen Pelvis W Contrast  09/14/2012  *RADIOLOGY REPORT*  Clinical Data: Right lower quadrant abdominal pain.  CT ABDOMEN AND PELVIS WITH CONTRAST  Technique:  Multidetector CT imaging of the abdomen and pelvis was performed following the standard protocol during bolus administration of intravenous contrast.  Contrast: 80mL OMNIPAQUE IOHEXOL 300 MG/ML  SOLN  Comparison: 12/08/2011  Findings: Subtle ground-glass densities at the lung bases could represent mild air trapping or atelectasis.  There is no evidence for free intraperitoneal air.  Normal appearance of the liver and portal venous system.  The gallbladder has been removed.  Normal appearance of the spleen, pancreas, left kidney and adrenal tissue.  The right kidney is malrotated but no evidence for hydronephrosis.  The appendix has a normal appearance.  There is no inflammation around the appendix.  Normal appearance of the prostate, seminal vesicles and urinary bladder.  Again seen is a prominent lymph node in the left external iliac region that measures 1.1 cm but not significantly changed. Overall, there is no significant free fluid or lymphadenopathy.  No acute bony abnormality.  There is vacuum disc phenomenon in the L4-L5 and L5-S1.  IMPRESSION: No acute abnormalities within the abdomen or pelvis.   Original Report Authenticated By: Richarda Overlie, [name]      No diagnosis found.   I reviewed the recent radiograph studies  From 2 days , as well as the note from 2 days ago.  10:43 AM Patient ambulatory - nausea persists.  Phenergan is delayed from pharmacy.  Initial labs notable for Lipase elevation.  12:12 PM Patient appears uncomfortable.  O2- 99%ra, normal   MDM  This young male with diabetes presents for second time several days with ongoing abdominal pain.  Notably, the patient had CT evaluation 2 days ago, and this was largely unremarkable.  The patient has prior cholecystectomy.  Today's evaluation demonstrates evidence of pancreatitis.  The patient received 2 L IV fluid resuscitation, as well  as analgesics, antiemetics.  Given the absence of significant that he was admitted for further evaluation and management the        Gerhard Munch, MD 09/16/12 1213

## 2012-09-16 NOTE — H&P (Signed)
Hospital Admission Note Date: 09/16/2012  Patient name: [name] Medical record number: [id number] Date of birth: 01-Aug-1975 Age: 37 y.o. Gender: male PCP: Default, Provider, MD  Medical Service: Internal Medicine Teaching Service   Attending physician:  Dr. Kem Kays    1st Contact: Dr. Sherrine Maples   Pager: [phone] 2nd Contact: Dr. Manson Passey   Pager: [phone] After 5 pm or weekends: 1st Contact:      Pager: [phone] 2nd Contact:      Pager: [phone]  Chief Complaint: Abdominal pain, nausea, vomiting  History of Present Illness:  37yo M with PMH HTN and DM II presents to the ED with 4 days of N/V, an abdominal pain. He was seen in the ED on 4/8 for similar symptoms. A CT abd/pelvis was performed and was normal, and labs were normal as well. He was told that his symptoms appeared to be related to a viral GI illness and he was sent home with Zofran after his symptoms improved. Dueing his intial visist, he endorsedHe presents today with continued mid and lower abdominal pain and N/V. He states that he has vomited 5x today and has not been able to eat or drink anything over the past 4 days. He states that he does not follow a very healthy diet and the last things he ate were ribs but that was at least 4 days ago. He denies EtOH use and states that his last drink was over 2 ys ago. He denies drug use. He is s/p lap chole 1 1/2 ys ago.  He is also c/o a productive cough over the past 2 days with green mucus. He endorses a sore throat with drainage as well, which seems to have resolved. He also feels as through he has had a fever but has not taken his temperature.  Of note, he is supposed to be taking Metformin, Levemir, and  for his diabetes, but ran out of medications in August. He states that he does still have some of the insulin and uses it from time to time.     Meds: Current Outpatient Rx  Name  Route  Sig  Dispense  Refill  . Insulin Detemir (LEVEMIR FLEXPEN [place])   Subcutaneous   Inject 20 Units  into the skin at bedtime. Based on CBGs         . lisinopril (PRINIVIL,ZESTRIL) 10 MG tablet   Oral   Take 2 tablets (20 mg total) by mouth daily.   14 tablet   0   . metFORMIN (GLUCOPHAGE) 500 MG tablet   Oral   Take 1 tablet (500 mg total) by mouth 2 (two) times daily with a meal.   14 tablet   0   . ondansetron (ZOFRAN) 4 MG tablet   Oral   Take 4 mg by mouth every 8 (eight) hours as needed for nausea. Nausea/vomiting           Allergies: Allergies as of 09/16/2012  . (No Known Allergies)   Past Medical History  Diagnosis Date  . Hypertension   . Diabetes mellitus     type 2 iddm x 18 yrs  . Vascular disease     poor circulation to left foot   Past Surgical History  Procedure Laterality Date  . Cholecystectomy    . Toe amputation  2012    left foot; great toe and second toe  . Amputation  04/15/2011    Procedure: AMPUTATION RAY;  Surgeon: Nadara Mustard, MD;  Location: [facility] OR;  Service:  Orthopedics;  Laterality: Left;  left foot third toe amputation and MPP joint and gastroc resection VS. achilles lengthing   . Amputation  04/25/2011    Procedure: AMPUTATION DIGIT;  Surgeon: Nadara Mustard, MD;  Location: [facility] OR;  Service: Orthopedics;  Laterality: Left;  Left foot 3rd toe amputation MTP joint, Gastroc Recession  Achilles Lengthening    History reviewed. No pertinent family history. History   Social History  . Marital Status: Married    Spouse Name: N/A    Number of Children: N/A  . Years of Education: N/A   Occupational History  . Not on file.   Social History Main Topics  . Smoking status: Current Every Day Smoker -- 0.25 packs/day for 1 years    Types: Cigarettes  . Smokeless tobacco: Never Used  . Alcohol Use: No  . Drug Use: 10.00 per week    Special: Marijuana  . Sexually Active: Not on file   Other Topics Concern  . Not on file   Social History Narrative  . No narrative on file    Review of Systems: A 10 point ROS was performed;  pertinent positives and negatives were noted in the HPI   Physical Exam: Blood pressure 146/84, pulse 81, temperature 97.7 F (36.5 C), resp. rate 18, weight 235 lb (106.595 kg), SpO2 94.00%. General: Alert, well-developed, and cooperative on examination.  Head: Normocephalic and atraumatic.  Eyes: Vision grossly intact, pupils equal, round, and reactive to light Mouth: Pharynx pink and moist Neck: Supple, full ROM.  Lungs: CTAB, normal respiratory effort, no accessory muscle use, no crackles, and no wheezes. Heart: Regular rate, regular rhythm, no murmur, no gallop, and no rub.  Abdomen: Soft, TTP in mid abdomen and bilateral lower quadrants, mild midepigastric tenderness to palpation, hypoactive bowel sounds Msk: No joint swelling, warmth, or erythema.  Extremities: 2+ radial and DP pulses bilaterally. No cyanosis, clubbing, edema Neurologic: Alert & oriented X3, nonfocal Skin: Turgor normal and no rashes.  Psych: Appears ill, otherwise normal   Lab results: Basic Metabolic Panel:  Recent Labs  16/10/96 1158 09/16/12 0855  NA 142 140  K 4.2 3.8  CL 104 103  CO2  --  25  GLUCOSE 109* 134*  BUN 15 19  CREATININE 1.40* 1.33  CALCIUM  --  9.6   Liver Function Tests:  Recent Labs  09/16/12 0855  AST 20  ALT 17  ALKPHOS 88  BILITOT 0.2*  PROT 8.0  ALBUMIN 3.2*    Recent Labs  09/14/12 1033 09/16/12 0855  LIPASE 19 140*   CBC:  Recent Labs  09/14/12 1033 09/14/12 1158 09/16/12 0855  WBC 14.5*  --  9.4  NEUTROABS 11.5*  --  7.2  HGB 13.0 13.9 13.0  HCT 38.1* 41.0 37.3*  MCV 87.8  --  86.7  PLT 214  --  206   CBG:  Recent Labs  09/16/12 0913  GLUCAP 130*   Urinalysis:  Recent Labs  09/14/12 1246  COLORURINE YELLOW  LABSPEC 1.021  PHURINE 6.5  GLUCOSEU NEGATIVE  HGBUR TRACE*  BILIRUBINUR NEGATIVE  KETONESUR NEGATIVE  PROTEINUR >300*  UROBILINOGEN 1.0  NITRITE NEGATIVE  LEUKOCYTESUR NEGATIVE     Imaging results:  Ct Abdomen  Pelvis W Contrast  09/14/2012  *RADIOLOGY REPORT*  Clinical Data: Right lower quadrant abdominal pain.  CT ABDOMEN AND PELVIS WITH CONTRAST  Technique:  Multidetector CT imaging of the abdomen and pelvis was performed following the standard protocol during bolus administration of intravenous contrast.  Contrast: 80mL OMNIPAQUE IOHEXOL 300 MG/ML  SOLN  Comparison: 12/08/2011  Findings: Subtle ground-glass densities at the lung bases could represent mild air trapping or atelectasis.  There is no evidence for free intraperitoneal air.  Normal appearance of the liver and portal venous system.  The gallbladder has been removed.  Normal appearance of the spleen, pancreas, left kidney and adrenal tissue.  The right kidney is malrotated but no evidence for hydronephrosis.  The appendix has a normal appearance.  There is no inflammation around the appendix.  Normal appearance of the prostate, seminal vesicles and urinary bladder.  Again seen is a prominent lymph node in the left external iliac region that measures 1.1 cm but not significantly changed. Overall, there is no significant free fluid or lymphadenopathy.  No acute bony abnormality.  There is vacuum disc phenomenon in the L4-L5 and L5-S1.  IMPRESSION: No acute abnormalities within the abdomen or pelvis.   Original Report Authenticated By: Richarda Overlie, [name]     Other results: EKG: None avaliable  Assessment & Plan by Problem: 37yo M with PMH HTN and DM II presents to the ED with 4 days of N/V, and abdominal pain.   Abdominal pain: His symptoms appear to be consistent with viral gastroenteritis which have not improved since he was last seen in the ED. He is tender to palpation in his lower quadrants and mid abdomen however denies any diarrhea but has been constipated for the past 4 days with hypoactive bowel sounds. He has minimal midepigastric tenderness but his lipase is elevated to 140, up from 19 on 4/8. This elevated lipase could be from his vomiting, but  we cannot rule out acute pancreatitis. He is 1 1/2 years s/p cholecystectomy- he is unsure of the reason; he states he hasn't eaten any food for the past 4 days but normally does not eat very healthy meals, and denies any alcohol use over the past 2 years, so possibly his pancreatitis is related to hypertriglyceridemia.  - Admit to IMTS to Med-Surg - NS@175  - NPO - Morphine 2mg  IV q4h PRN - Phenergan/Zofran - Protonix 40mg  IV daily - Lipid panel - AM BMP - AM CBC  Productive cough: Cough x2 days. He endorses a sore throat initially, which has resolved. He is having sneezing and nasal congestion. He denies sick contacts and denies seasonal allergies. This could possibly be a viral URI vs allergic rhinitis. Will obtain CXR to r/o possible infection. - CXR  Diabetes Mellitus, Type II: Pt is supposed to be on Levemir, Metformin, and Glipizide but has only been using his insulin at times. He states that he ran out of his medicines in August. His blood glucose is 139 today, but he has not been eating or drinking over the past few days. Will hold treatment for now while NPO and check CBGs. - A1c - CBGs q4h  HTN: H/o HTN, not on medications. BP elevated on admission, but improving, likely due to his vomiting. Will start PRN Metoprolol. - Metoprolol 5mg  IV q6h PRN with hold parameters  DVT PPx: Lovenox   Dispo: Disposition is deferred at this time, awaiting improvement of current medical problems. Anticipated discharge in approximately 1-3 day(s).   The patient does not have a current PCP (Default, Provider, MD), therefore will possibly be requiring OPC follow-up after discharge.   The patient does not have transportation limitations that hinder transportation to clinic appointments.  Signed: Genelle Gather 09/16/2012, 12:53 PM

## 2012-09-17 DIAGNOSIS — K859 Acute pancreatitis without necrosis or infection, unspecified: Principal | ICD-10-CM

## 2012-09-17 DIAGNOSIS — R112 Nausea with vomiting, unspecified: Secondary | ICD-10-CM

## 2012-09-17 DIAGNOSIS — A084 Viral intestinal infection, unspecified: Secondary | ICD-10-CM | POA: Diagnosis present

## 2012-09-17 LAB — BASIC METABOLIC PANEL
BUN: 14 mg/dL (ref 6–23)
Calcium: 8.4 mg/dL (ref 8.4–10.5)
Creatinine, Ser: 1.21 mg/dL (ref 0.50–1.35)
GFR calc Af Amer: 88 mL/min — ABNORMAL LOW (ref >90)
GFR calc non Af Amer: 76 mL/min — ABNORMAL LOW (ref >90)

## 2012-09-17 LAB — CBC
HCT: 33.2 % — ABNORMAL LOW (ref 39.0–52.0)
MCHC: 33.7 g/dL (ref 30.0–36.0)
Platelets: 215 10*3/uL (ref 150–400)
RDW: 13.8 % (ref 11.5–15.5)

## 2012-09-17 LAB — LIPID PANEL
Cholesterol: 152 mg/dL (ref 0–200)
VLDL: 22 mg/dL (ref 0–40)

## 2012-09-17 LAB — GLUCOSE, CAPILLARY
Glucose-Capillary: 88 mg/dL (ref 70–99)
Glucose-Capillary: 127 mg/dL — ABNORMAL HIGH (ref 70–99)

## 2012-09-17 MED ORDER — POTASSIUM CHLORIDE 10 MEQ/100ML IV SOLN
10.0000 meq | INTRAVENOUS | Status: AC
  Administered 2012-09-17 (×2): 10 meq via INTRAVENOUS
  Filled 2012-09-17 (×2): qty 100

## 2012-09-17 MED ORDER — OXYCODONE HCL 5 MG PO TABS: 5.0000 mg | ORAL_TABLET | ORAL | Status: DC | PRN

## 2012-09-17 MED ORDER — PANTOPRAZOLE SODIUM 20 MG PO TBEC: 20.0000 mg | DELAYED_RELEASE_TABLET | Freq: Every day | ORAL | Status: DC

## 2012-09-17 MED ORDER — POTASSIUM CHLORIDE 10 MEQ/100ML IV SOLN: 10.0000 meq | INTRAVENOUS | Status: DC

## 2012-09-17 NOTE — Progress Notes (Signed)
NURSING PROGRESS NOTE  [name] [id number] Discharge Data: 09/17/2012 8:16 PM Attending Provider: Jonah Blue, DO [id number], Provider, MD     [name] to be D/C'd Home per MD order.  Discussed with the patient the After Visit Summary and all questions fully answered. All IV's discontinued with no bleeding noted. All belongings returned to patient for patient to take home.   Last Vital Signs:  Blood pressure 156/75, pulse 75, temperature 98.3 F (36.8 C), temperature source Oral, resp. rate 20, height 6\' 1"  (1.854 m), weight 106.595 kg (235 lb), SpO2 100.00%.  Discharge Medication List   Medication List    STOP taking these medications       LEVEMIR FLEXPEN [place]     metFORMIN 500 MG tablet  Commonly known as:  GLUCOPHAGE     ondansetron 4 MG tablet  Commonly known as:  ZOFRAN      TAKE these medications       lisinopril 10 MG tablet  Commonly known as:  PRINIVIL,ZESTRIL  Take 2 tablets (20 mg total) by mouth daily.         Pt's education was done. Pt was wheeled down via wheelchair.

## 2012-09-17 NOTE — Care Management Note (Unsigned)
    Page 1 of 1   09/17/2012     12:09:50 PM   CARE MANAGEMENT NOTE 09/17/2012  Patient:  DYER, KLUG   Account Number:  [id number]  Date Initiated:  09/17/2012  Documentation initiated by:  Letha Cape  Subjective/Objective Assessment:   dx pancreatitis  admit- lives with family.     Action/Plan:   Anticipated DC Date:  09/19/2012   Anticipated DC Plan:  HOME/SELF CARE      DC Planning Services  CM consult      Choice offered to / List presented to:             Status of service:  In process, will continue to follow Medicare Important Message given?   (If response is "NO", the following Medicare IM given date fields will be blank) Date Medicare IM given:   Date Additional Medicare IM given:    Discharge Disposition:    Per UR Regulation:  Reviewed for med. necessity/level of care/duration of stay  If discussed at Long Length of Stay Meetings, dates discussed:    Comments:  09/17/12 11:15 Letha Cape RN, BSN [phone] patient lives with family, pta indep.  Patient does not have a PCP , NCM will schedule apt for patient at [facility].  Patient's apt at [facility] is 4/18 at 5:30.

## 2012-09-17 NOTE — Discharge Summary (Signed)
Internal Medicine Teaching [facility] Discharge Note  Name: [name] MRN: [id number] DOB: 05-21-1976 37 y.o.  Date of Admission: 09/16/2012  8:17 AM Date of Discharge: 09/17/2012 Attending Physician: Jonah Blue, DO  Discharge Diagnosis: Principal Problem:   Acute pancreatitis Active Problems:   Diabetes mellitus   Nausea & vomiting   Discharge Medications:   Medication List    ASK your doctor about these medications       LEVEMIR FLEXPEN [place]  Inject 20 Units into the skin at bedtime. Based on CBGs     lisinopril 10 MG tablet  Commonly known as:  PRINIVIL,ZESTRIL  Take 2 tablets (20 mg total) by mouth daily.     metFORMIN 500 MG tablet  Commonly known as:  GLUCOPHAGE  Take 1 tablet (500 mg total) by mouth 2 (two) times daily with a meal.     ondansetron 4 MG tablet  Commonly known as:  ZOFRAN  Take 4 mg by mouth every 8 (eight) hours as needed for nausea. Nausea/vomiting        Disposition and follow-up:   [name] was discharged from [facility] in Stable condition.  At the hospital follow up visit please address his constipation, his blood pressure as it has been elevated, and his blood glucose- he was on Levemir, glypizide, and metformin previously but has not been taking them since August; A1c this admission was 5.6. He has also been given a list of community providers.  Follow-up Appointments:     Follow-up Information   Follow up with [facility] On 09/24/2012. (5:30, please bring $20 co-pay and photo id and medications)    Contact information:   [address] [place] [address] [phone]       Consultations:  None  Procedures Performed:  X-ray Chest Pa And Lateral   09/16/2012  *RADIOLOGY REPORT*  Clinical Data: Cough  CHEST - 2 VIEW  Comparison: 07/07/12  Findings: Cardiomediastinal silhouette is stable.  No acute infiltrate or pleural effusion.  No pulmonary edema.   Bony thorax is unremarkable.  IMPRESSION: No active disease.  No significant change.   Original Report Authenticated By: Natasha Mead, [name]    Ct Abdomen Pelvis W Contrast  09/14/2012  *RADIOLOGY REPORT*  Clinical Data: Right lower quadrant abdominal pain.  CT ABDOMEN AND PELVIS WITH CONTRAST  Technique:  Multidetector CT imaging of the abdomen and pelvis was performed following the standard protocol during bolus administration of intravenous contrast.  Contrast: 80mL OMNIPAQUE IOHEXOL 300 MG/ML  SOLN  Comparison: 12/08/2011  Findings: Subtle ground-glass densities at the lung bases could represent mild air trapping or atelectasis.  There is no evidence for free intraperitoneal air.  Normal appearance of the liver and portal venous system.  The gallbladder has been removed.  Normal appearance of the spleen, pancreas, left kidney and adrenal tissue.  The right kidney is malrotated but no evidence for hydronephrosis.  The appendix has a normal appearance.  There is no inflammation around the appendix.  Normal appearance of the prostate, seminal vesicles and urinary bladder.  Again seen is a prominent lymph node in the left external iliac region that measures 1.1 cm but not significantly changed. Overall, there is no significant free fluid or lymphadenopathy.  No acute bony abnormality.  There is vacuum disc phenomenon in the L4-L5 and L5-S1.  IMPRESSION: No acute abnormalities within the abdomen or pelvis.   Original Report Authenticated By: Richarda Overlie, [name]  Admission History of Present Illness:  37yo M with PMH HTN and DM II presents to the ED with 4 days of N/V, an abdominal pain. He was seen in the ED on 4/8 for similar symptoms. A CT abd/pelvis was performed and was normal, and labs were normal as well. He was told that his symptoms appeared to be related to a viral GI illness and he was sent home with Zofran after his symptoms improved. Dueing his intial visist, he endorsedHe presents today with continued  mid and lower abdominal pain and N/V. He states that he has vomited 5x today and has not been able to eat or drink anything over the past 4 days. He states that he does not follow a very healthy diet and the last things he ate were ribs but that was at least 4 days ago. He denies EtOH use and states that his last drink was over 2 ys ago. He denies drug use. He is s/p lap chole 1 1/2 ys ago.  He is also c/o a productive cough over the past 2 days with green mucus. He endorses a sore throat with drainage as well, which seems to have resolved. He also feels as through he has had a fever but has not taken his temperature.  Of note, he is supposed to be taking Metformin, Levemir, and for his diabetes, but ran out of medications in August. He states that he does still have some of the insulin and uses it from time to time.  Review of Systems:  A 10 point ROS was performed; pertinent positives and negatives were noted in the HPI  Physical Exam:  Blood pressure 146/84, pulse 81, temperature 97.7 F (36.5 C), resp. rate 18, weight 235 lb (106.595 kg), SpO2 94.00%.  General: Alert, well-developed, and cooperative on examination.  Head: Normocephalic and atraumatic.  Eyes: Vision grossly intact, pupils equal, round, and reactive to light  Mouth: Pharynx pink and moist Neck: Supple, full ROM.  Lungs: CTAB, normal respiratory effort, no accessory muscle use, no crackles, and no wheezes. Heart: Regular rate, regular rhythm, no murmur, no gallop, and no rub.  Abdomen: Soft, TTP in mid abdomen and bilateral lower quadrants, mild midepigastric tenderness to palpation, hypoactive bowel sounds  Msk: No joint swelling, warmth, or erythema.  Extremities: 2+ radial and DP pulses bilaterally. No cyanosis, clubbing, edema Neurologic: Alert & oriented X3, nonfocal  Skin: Turgor normal and no rashes.  Psych: Appears ill, otherwise normal    Hospital Course by problem list: 37yo M with PMH HTN and DM II who was  admitted with 4 days of N/V and abdominal pain.   1. Possible viral gastroenteritis and acute pancreatitis: His symptoms appear to be consistent with viral gastroenteritis which, on admission, had not improved since he was last seen in the ED on 4/8. However, today his symptoms have dramatically improved and he looks much better than on admission. He is only mildly tender to palpation in his lower quadrants still with hypoactive bowel sounds.The elevated lipase on admission could be from his vomiting, as acute pancreatitis seems unlikely at this time, but still cannot rule out. He does have a small leukocytosis to 11.2 this morning but remains afebrile. Of note, his lipid panel was normal, HIV nonreactive, and A1c was 5.6 (even off medications for approximately 7-37mo). On the morning of discharge, he did complain about lower abdominal pain, his bowel sounds were hypoactive, and he had not had a bowel movement in over 4 days; possible  that his abdominal pain is from his constipation, which can be monitored as an outpatient. He diet was sucessfully advanced and well tolerated, and he was ready for discharge home.  2. Diabetes Mellitus, Type II: Pt is supposed to be on Levemir, Metformin, and Glipizide but has reportedly only been using his insulin intermittently. He states that he ran out of his medicines in August '13. His blood glucose was 139 on admission, but he had not been eating or drinking over the days prior to admission. CBGs since admission are well controlled. His A1c is 5.6 off of his medications, and since he appears to have good control, he really might be able to maintain control with diet. His blood glucose will need to be monitored as an outpatient by a medical provider.  3. HTN: H/o HTN, not on medications. BP elevated on admission, but improving, likely due to his vomiting/pain. PRN IV Metoprolol was started on admission, but he only required 2 doses when he was first admitted and none since  that time. His pressures have been stable but slightly elevated since that time. This will need to be monitored as an outpatient.  Productive cough: Resolved. Cough x2 days prior to admission. He endorsed a sore throat initially, which resolved prior to admission. CXR was normal. He was having sneezing and nasal congestion, thought likely from a viral URI vs allergic rhinitis.    Discharge Vitals:  BP 153/95  Pulse 69  Temp(Src) 98.2 F (36.8 C) (Oral)  Resp 20  Ht 6\' 1"  (1.854 m)  Wt 235 lb (106.595 kg)  BMI 31.01 kg/m2  SpO2 99%  Discharge Labs:  Results for orders placed during the hospital encounter of 09/16/12 (from the past 24 hour(s))  URINE RAPID DRUG SCREEN (HOSP PERFORMED)     Status: Abnormal   Collection Time    09/16/12  5:07 PM      Result Value Range   Opiates POSITIVE (*) NONE DETECTED   Cocaine NONE DETECTED  NONE DETECTED   Benzodiazepines NONE DETECTED  NONE DETECTED   Amphetamines NONE DETECTED  NONE DETECTED   Tetrahydrocannabinol POSITIVE (*) NONE DETECTED   Barbiturates NONE DETECTED  NONE DETECTED  HEMOGLOBIN A1C     Status: None   Collection Time    09/16/12  5:09 PM      Result Value Range   Hemoglobin A1C 5.6  <5.7 %   Mean Plasma Glucose 114  <117 mg/dL  HIV ANTIBODY (ROUTINE TESTING)     Status: None   Collection Time    09/16/12  5:09 PM      Result Value Range   HIV NON REACTIVE  NON REACTIVE  GLUCOSE, CAPILLARY     Status: Abnormal   Collection Time    09/16/12  7:01 PM      Result Value Range   Glucose-Capillary 128 (*) 70 - 99 mg/dL  GLUCOSE, CAPILLARY     Status: Abnormal   Collection Time    09/16/12 11:51 PM      Result Value Range   Glucose-Capillary 103 (*) 70 - 99 mg/dL  BASIC METABOLIC PANEL     Status: Abnormal   Collection Time    09/17/12  6:58 AM      Result Value Range   Sodium 141  135 - 145 mEq/L   Potassium 3.3 (*) 3.5 - 5.1 mEq/L   Chloride 108  96 - 112 mEq/L   CO2 27  19 - 32 mEq/L   Glucose, Bld  91  70 - 99  mg/dL   BUN 14  6 - 23 mg/dL   Creatinine, Ser 1.61  0.50 - 1.35 mg/dL   Calcium 8.4  8.4 - 09.6 mg/dL   GFR calc non Af Amer 76 (*) >90 mL/min   GFR calc Af Amer 88 (*) >90 mL/min  CBC     Status: Abnormal   Collection Time    09/17/12  6:58 AM      Result Value Range   WBC 11.2 (*) 4.0 - 10.5 K/uL   RBC 3.76 (*) 4.22 - 5.81 MIL/uL   Hemoglobin 11.2 (*) 13.0 - 17.0 g/dL   HCT 04.5 (*) 40.9 - 81.1 %   MCV 88.3  78.0 - 100.0 fL   MCH 29.8  26.0 - 34.0 pg   MCHC 33.7  30.0 - 36.0 g/dL   RDW 91.4  78.2 - 95.6 %   Platelets 215  150 - 400 K/uL  LIPID PANEL     Status: Abnormal   Collection Time    09/17/12  6:58 AM      Result Value Range   Cholesterol 152  0 - 200 mg/dL   Triglycerides 213  <086 mg/dL   HDL 38 (*) >57 mg/dL   Total CHOL/HDL Ratio 4.0     VLDL 22  0 - 40 mg/dL   LDL Cholesterol 92  0 - 99 mg/dL  GLUCOSE, CAPILLARY     Status: None   Collection Time    09/17/12  7:56 AM      Result Value Range   Glucose-Capillary 90  70 - 99 mg/dL  GLUCOSE, CAPILLARY     Status: None   Collection Time    09/17/12 11:22 AM      Result Value Range   Glucose-Capillary 88  70 - 99 mg/dL    Signed: Genelle Gather 09/17/2012, 3:21 PM   Time Spent on Discharge: Services Ordered on Discharge: None Equipment Ordered on Discharge: None

## 2012-09-17 NOTE — H&P (Signed)
INTERNAL MEDICINE TEACHING SERVICE Attending Admission Note  Date: 09/17/2012  Patient name: [name]  Medical record number: [id number]  Date of birth: [birth year]    I have seen and evaluated [name] and discussed their care with the Residency Team.  36 yr. Old AAM w/ pmhx signficant for HTN, Type 2 IDDM, HTN, presented due to abdominal pain, nausea vomiting.  He has been seen in the ED over the past year for repeated similar symptoms. A CT abd/pelvis was performed and no abnormalities reported, other than a malrotated right kidney without hydronephrosis or stones visible.  He admits to recent sick contacts with similar symptoms.  He denies melena or hematochezia.  He states his abdominal pain was mostly over the lower quadrants, crampy in nature, but has now resolved.  He admits to significant vomiting over the previous few days and decreased PO intake. He has a hx of lap chole for gallstones. He denies alcohol use.  He denies illicit drug use but THC was detected in his urine and then he stated he uses THC about 3 times a month. He admitted to recent upper respiratory symptoms likely due to a recent viral URI.  A CXR showed no infiltrated and these have mostly resolved.  He states he skips his medications from time to time due to affordability issues. An elevated lipase was noted on admission. He feels better today and is eager to go home. Physical Exam: Blood pressure 154/92, pulse 72, temperature 98.1 F (36.7 C), temperature source Oral, resp. rate 18, height 6\' 1"  (1.854 m), weight 235 lb (106.595 kg), SpO2 98.00%.  General: Vital signs reviewed and noted. Well-developed, well-nourished, in no acute distress; alert, appropriate and cooperative throughout examination.  Head: Normocephalic, atraumatic.  Eyes: PERRL, EOMI, No signs of anemia or jaundince.  Nose: Mucous membranes moist, not inflammed, nonerythematous.  Throat: Oropharynx nonerythematous, no exudate appreciated.    Neck: No deformities, masses, or tenderness noted.Supple, No carotid Bruits, no JVD.  Lungs:  Normal respiratory effort. Clear to auscultation BL without crackles or wheezes.  Heart: RRR. S1 and S2 normal without gallop, murmur, or rubs.  Abdomen:  +BS. Mild LLQ/RLQ tenderness on deep palpation. No masses. No epigastric tenderness.  Extremities: No pretibial edema.  Neurologic: A&O X3, CN II - XII are grossly intact. Motor strength is 5/5 in the all 4 extremities, Sensations intact to light touch, Cerebellar signs negative.  Skin: No visible rashes, scars.    Lab results: Results for orders placed during the hospital encounter of 09/16/12 (from the past 24 hour(s))  URINE RAPID DRUG SCREEN (HOSP PERFORMED)     Status: Abnormal   Collection Time    09/16/12  5:07 PM      Result Value Range   Opiates POSITIVE (*) NONE DETECTED   Cocaine NONE DETECTED  NONE DETECTED   Benzodiazepines NONE DETECTED  NONE DETECTED   Amphetamines NONE DETECTED  NONE DETECTED   Tetrahydrocannabinol POSITIVE (*) NONE DETECTED   Barbiturates NONE DETECTED  NONE DETECTED  HEMOGLOBIN A1C     Status: None   Collection Time    09/16/12  5:09 PM      Result Value Range   Hemoglobin A1C 5.6  <5.7 %   Mean Plasma Glucose 114  <117 mg/dL  HIV ANTIBODY (ROUTINE TESTING)     Status: None   Collection Time    09/16/12  5:09 PM      Result Value Range   HIV NON REACTIVE  NON REACTIVE  GLUCOSE, CAPILLARY     Status: Abnormal   Collection Time    09/16/12  7:01 PM      Result Value Range   Glucose-Capillary 128 (*) 70 - 99 mg/dL  GLUCOSE, CAPILLARY     Status: Abnormal   Collection Time    09/16/12 11:51 PM      Result Value Range   Glucose-Capillary 103 (*) 70 - 99 mg/dL  BASIC METABOLIC PANEL     Status: Abnormal   Collection Time    09/17/12  6:58 AM      Result Value Range   Sodium 141  135 - 145 mEq/L   Potassium 3.3 (*) 3.5 - 5.1 mEq/L   Chloride 108  96 - 112 mEq/L   CO2 27  19 - 32 mEq/L    Glucose, Bld 91  70 - 99 mg/dL   BUN 14  6 - 23 mg/dL   Creatinine, Ser 1.61  0.50 - 1.35 mg/dL   Calcium 8.4  8.4 - 09.6 mg/dL   GFR calc non Af Amer 76 (*) >90 mL/min   GFR calc Af Amer 88 (*) >90 mL/min  CBC     Status: Abnormal   Collection Time    09/17/12  6:58 AM      Result Value Range   WBC 11.2 (*) 4.0 - 10.5 K/uL   RBC 3.76 (*) 4.22 - 5.81 MIL/uL   Hemoglobin 11.2 (*) 13.0 - 17.0 g/dL   HCT 04.5 (*) 40.9 - 81.1 %   MCV 88.3  78.0 - 100.0 fL   MCH 29.8  26.0 - 34.0 pg   MCHC 33.7  30.0 - 36.0 g/dL   RDW 91.4  78.2 - 95.6 %   Platelets 215  150 - 400 K/uL  LIPID PANEL     Status: Abnormal   Collection Time    09/17/12  6:58 AM      Result Value Range   Cholesterol 152  0 - 200 mg/dL   Triglycerides 213  <086 mg/dL   HDL 38 (*) >57 mg/dL   Total CHOL/HDL Ratio 4.0     VLDL 22  0 - 40 mg/dL   LDL Cholesterol 92  0 - 99 mg/dL  GLUCOSE, CAPILLARY     Status: None   Collection Time    09/17/12  7:56 AM      Result Value Range   Glucose-Capillary 90  70 - 99 mg/dL  GLUCOSE, CAPILLARY     Status: None   Collection Time    09/17/12 11:22 AM      Result Value Range   Glucose-Capillary 88  70 - 99 mg/dL    Imaging results:  X-ray Chest Pa And Lateral   09/16/2012  *RADIOLOGY REPORT*  Clinical Data: Cough  CHEST - 2 VIEW  Comparison: 07/07/12  Findings: Cardiomediastinal silhouette is stable.  No acute infiltrate or pleural effusion.  No pulmonary edema.  Bony thorax is unremarkable.  IMPRESSION: No active disease.  No significant change.   Original Report Authenticated By: Natasha Mead, [name]      Assessment and Plan: I agree with the formulated Assessment and Plan with the following changes:  36 yr. Old AAM w/ pmhx signficant for HTN, Type 2 IDDM, HTN, presented due to abdominal pain, nausea vomiting. 1) Gastroenteritis: I am not convinced this represents acute pancreatitis, I think lipase is elevated due to vomiting. I would advance his diet, make sure his electrolytes  are replaced, and if he tolerates  meals, he can go home.  He will need PCP f/u to assure he completed his medicaid application and continues to take his medications.   2) Type 2 IDDM: A1C is controlled, so I suspect he is taking enough of his medications. He will need f/u. -rest per resident note.   The treatment plan was discussed in detail with the patient.  Alternatives to treatment, side effects, risks and benefits, and complications were discussed with the patient. Informed consent was obtained. The patient agrees to proceed with the current treatment plan.  Jonah Blue, [place] 4/11/20141:14 PM

## 2012-09-17 NOTE — Progress Notes (Signed)
Subjective: No further episodes of nausea and vomiting since last night, per pt. Still with mid lower abdominal pain, which is improved from yesterday. Still no bowel movement over the past few days.  Objective: Vital signs in last 24 hours: Filed Vitals:   09/16/12 1940 09/16/12 2033 09/17/12 0500 09/17/12 0835  BP: 175/95 162/88 127/78 154/92  Pulse: 80  77 72  Temp: 98.8 F (37.1 C)  98.9 F (37.2 C) 98.1 F (36.7 C)  TempSrc: Oral  Oral Oral  Resp: 22  20 18   Height:      Weight:      SpO2: 100%  98% 98%   Weight change:   Intake/Output Summary (Last 24 hours) at 09/17/12 1118 Last data filed at 09/17/12 0900  Gross per 24 hour  Intake      0 ml  Output    126 ml  Net   -126 ml   Vitals reviewed. General: Resting in bed, NAD HEENT: PERRL, EOMI, no scleral icterus Cardiac: RRR, no rubs, murmurs or gallops Pulm: clear to auscultation bilaterally, no wheezes, rales, or rhonchi Abd: soft, mildly TTP in bilateral lower quadrants, nondistended, hypoactive bowel sounds Ext: warm and well perfused, no pedal edema, vascular changes proximal to the ankle bilaterally Neuro: alert and oriented X3, nonfocal  Lab Results: Basic Metabolic Panel:  Recent Labs Lab 09/16/12 0855 09/17/12 0658  NA 140 141  K 3.8 3.3*  CL 103 108  CO2 25 27  GLUCOSE 134* 91  BUN 19 14  CREATININE 1.33 1.21  CALCIUM 9.6 8.4   Liver Function Tests:  Recent Labs Lab 09/16/12 0855  AST 20  ALT 17  ALKPHOS 88  BILITOT 0.2*  PROT 8.0  ALBUMIN 3.2*    Recent Labs Lab 09/14/12 1033 09/16/12 0855  LIPASE 19 140*   CBC:  Recent Labs Lab 09/14/12 1033  09/16/12 0855 09/17/12 0658  WBC 14.5*  --  9.4 11.2*  NEUTROABS 11.5*  --  7.2  --   HGB 13.0  < > 13.0 11.2*  HCT 38.1*  < > 37.3* 33.2*  MCV 87.8  --  86.7 88.3  PLT 214  --  206 215  < > = values in this interval not displayed.  CBG:  Recent Labs Lab 09/16/12 0913 09/16/12 1901 09/16/12 2351 09/17/12 0756  GLUCAP  130* 128* 103* 90   Hemoglobin A1C:  Recent Labs Lab 09/16/12 1709  HGBA1C 5.6   Fasting Lipid Panel:  Recent Labs Lab 09/17/12 0658  CHOL 152  HDL 38*  LDLCALC 92  TRIG 454  CHOLHDL 4.0   Urine Drug Screen: Drugs of Abuse     Component Value Date/Time   LABOPIA POSITIVE* 09/16/2012 1707   COCAINSCRNUR NONE DETECTED 09/16/2012 1707   LABBENZ NONE DETECTED 09/16/2012 1707   AMPHETMU NONE DETECTED 09/16/2012 1707   THCU POSITIVE* 09/16/2012 1707   LABBARB NONE DETECTED 09/16/2012 1707    Urinalysis:  Recent Labs Lab 09/14/12 1246  COLORURINE YELLOW  LABSPEC 1.021  PHURINE 6.5  GLUCOSEU NEGATIVE  HGBUR TRACE*  BILIRUBINUR NEGATIVE  KETONESUR NEGATIVE  PROTEINUR >300*  UROBILINOGEN 1.0  NITRITE NEGATIVE  LEUKOCYTESUR NEGATIVE   Studies/Results: X-ray Chest Pa And Lateral   09/16/2012  *RADIOLOGY REPORT*  Clinical Data: Cough  CHEST - 2 VIEW  Comparison: 07/07/12  Findings: Cardiomediastinal silhouette is stable.  No acute infiltrate or pleural effusion.  No pulmonary edema.  Bony thorax is unremarkable.  IMPRESSION: No active disease.  No significant change.  Original Report Authenticated By: Natasha Mead, [name]    Medications: I have reviewed the patient's current medications. Scheduled Meds: . enoxaparin (LOVENOX) injection  40 mg Subcutaneous Q24H  . pantoprazole (PROTONIX) IV  40 mg Intravenous Daily  . potassium chloride  10 mEq Intravenous Q1 Hr x 2   Continuous Infusions: . sodium chloride 175 mL/hr at 09/17/12 0723   PRN Meds:.acetaminophen, acetaminophen, metoprolol, morphine injection, ondansetron (ZOFRAN) IV, ondansetron, promethazine  Assessment/Plan: 37yo M with PMH HTN and DM II who was admitted with 4 days of N/V and abdominal pain.  Possible viral gastroenteritis and acute pancreatitis: His symptoms appear to be consistent with viral gastroenteritis which, on admission, had not improved since he was last seen in the ED on 4/8. However, today his  symptoms have dramatically improved and he looks much better than on admission. He is only mildly tender to palpation in his lower quadrants still with hypoactive bowel sounds.The elevated lipase on admission could be from his vomiting, as acute pancreatitis seems unlikely at this time, but still cannot rule out. He does have  A small leukocytosis this morning but remains afebrile. Of note, his lipid panel was normal, HIV nonreactive, and A1c was 5.6 (even off medications). He continues to complain about lower abdominal pain, his bowel sounds are hypoactive, and he has not had a bowel movement in over 4 days; possible that his abdominal pain is from his constipation. Holding off on treating the constipation at this time, waiting for him to tolerate a diet.  - NS@175 , will d/c pending po intake. - Advance diet as tolerated - D/c IV pain medication, start po oxycodone IR - Phenergan/Zofran  - Protonix 20mg  po daily  - AM BMP  - AM CBC   Diabetes Mellitus, Type II: Pt is supposed to be on Levemir, Metformin, and Glipizide but has only been using his insulin at times. He states that he ran out of his medicines in August. His blood glucose was 139 on admission, but he had not been eating or drinking over the days prior to admission. CBGs since admission are well controlled. His A1c is 5.6 - CBGs AC&HS  HTN: H/o HTN, not on medications. BP elevated on admission, but improving, likely from to his vomiting/pain. PRN IV Metoprolol was started on admission, but he has not required any since that time. Once he is tolerating a diet, will transition to po medications if needed.  - Metoprolol 5mg  IV q6h PRN with hold parameters   Productive cough: Resolved. Cough x2 days prior to admission. He endorsed a sore throat initially, which resolved prior to admission. He was having sneezing and nasal congestion, thought likely from a viral URI vs allergic rhinitis. CXR normal.   DVT PPx: Lovenox  Dispo: Disposition is  deferred at this time, awaiting improvement of current medical problems.  Anticipated discharge in approximately 1-2 day(s).   The patient does not have a current PCP (Default, Provider, MD), therefore will possibly be requiring OPC follow-up after discharge (previous [facility] patient)  The patient does not have transportation limitations that hinder transportation to clinic appointments.  .Services Needed at time of discharge: Y = Yes, Blank = No PT:   OT:   RN:   Equipment:   Other:     LOS: 1 day   Genelle Gather 09/17/2012, 11:18 AM

## 2012-09-20 NOTE — Discharge Summary (Signed)
INTERNAL MEDICINE TEACHING SERVICE Attending Note  Date: 09/20/2012  Patient name: [name]  Medical record number: [id number]  Date of birth: 08-01-1975   This patient has been  discussed with the house staff. Please see their note for complete details. I concur with their findings and plan.   The treatment plan was discussed in detail with the patient.  Alternatives to treatment, side effects, risks and benefits, and complications were discussed with the patient. Informed consent was obtained. The patient agrees to proceed with the current treatment plan.  Jonah Blue, DO  09/20/2012, 11:07 AM

## 2012-10-01 ENCOUNTER — Encounter (HOSPITAL_COMMUNITY): Payer: Self-pay | Admitting: Emergency Medicine

## 2012-10-01 ENCOUNTER — Emergency Department (HOSPITAL_COMMUNITY)
Admission: EM | Admit: 2012-10-01 | Discharge: 2012-10-01 | Disposition: A | Discharge status: Home / Self Care | Payer: Medicaid Other | Attending: Emergency Medicine | Admitting: Emergency Medicine

## 2012-10-01 DIAGNOSIS — M79675 Pain in left toe(s): Secondary | ICD-10-CM

## 2012-10-01 DIAGNOSIS — Z8679 Personal history of other diseases of the circulatory system: Secondary | ICD-10-CM | POA: Insufficient documentation

## 2012-10-01 DIAGNOSIS — Z8739 Personal history of other diseases of the musculoskeletal system and connective tissue: Secondary | ICD-10-CM | POA: Insufficient documentation

## 2012-10-01 DIAGNOSIS — I10 Essential (primary) hypertension: Secondary | ICD-10-CM | POA: Insufficient documentation

## 2012-10-01 DIAGNOSIS — M79609 Pain in unspecified limb: Secondary | ICD-10-CM | POA: Insufficient documentation

## 2012-10-01 DIAGNOSIS — E119 Type 2 diabetes mellitus without complications: Secondary | ICD-10-CM | POA: Insufficient documentation

## 2012-10-01 DIAGNOSIS — Z79899 Other long term (current) drug therapy: Secondary | ICD-10-CM | POA: Insufficient documentation

## 2012-10-01 LAB — CBC WITH DIFFERENTIAL/PLATELET
Basophils Absolute: 0 10*3/uL (ref 0.0–0.1)
Basophils Relative: 0 % (ref 0–1)
Hemoglobin: 11.7 g/dL — ABNORMAL LOW (ref 13.0–17.0)
MCHC: 33.5 g/dL (ref 30.0–36.0)
Monocytes Relative: 8 % (ref 3–12)
Neutro Abs: 4.5 10*3/uL (ref 1.7–7.7)
Neutrophils Relative %: 60 % (ref 43–77)
WBC: 7.6 10*3/uL (ref 4.0–10.5)

## 2012-10-01 MED ORDER — HYDROCODONE-ACETAMINOPHEN 5-325 MG PO TABS: 1.0000 | ORAL_TABLET | Freq: Four times a day (QID) | ORAL | Status: DC | PRN

## 2012-10-01 MED ORDER — CEPHALEXIN 500 MG PO CAPS: 500.0000 mg | ORAL_CAPSULE | Freq: Four times a day (QID) | ORAL | Status: DC

## 2012-10-01 MED ORDER — HYDROCODONE-ACETAMINOPHEN 5-325 MG PO TABS
1.0000 | ORAL_TABLET | ORAL | Status: AC
  Administered 2012-10-01: 1 via ORAL
  Filled 2012-10-01: qty 1

## 2012-10-01 NOTE — ED Notes (Signed)
Pt presenting to ed with c/o swelling to left leg and pt with drainage noted to his left toe where he had amputation due to his diabetes. Pt with dark discoloration noted to lower leg onset x 3 days

## 2012-10-01 NOTE — ED Provider Notes (Signed)
History    CSN: [id number] Arrival date & time 10/01/12  1819 First MD Initiated Contact with Patient 10/01/12 1915      Chief Complaint  Patient presents with  . swelling to leg    HPI Patient presents to the emergency room with complaints of a sore on his left great toe. Patient states he noticed it a few days ago.  Since that time the symptoms have gradually progressed. He has not had any fevers. He has not noticed any erythema. He has noted some soreness in his lower leg.  She has history of diabetes and previously had an amputation of toes on that left foot. The patient's diabetes is under better control now and in fact he does not take medications any longer. The patient was concerned about the possibility of infection so he came to the emergency department.  Past Medical History  Diagnosis Date  . Hypertension   . Diabetes mellitus     type 2 iddm x 18 yrs  . Vascular disease     poor circulation to left foot  . Acute osteomyelitis, ankle and foot 07/30/2010    Qualifier: Diagnosis of  By: Daiva Eves MD, Remi Haggard      Past Surgical History  Procedure Laterality Date  . Cholecystectomy    . Toe amputation  2012    left foot; great toe and second toe  . Amputation  04/15/2011    Procedure: AMPUTATION RAY;  Surgeon: Nadara Mustard, MD;  Location: [facility] OR;  Service: Orthopedics;  Laterality: Left;  left foot third toe amputation and MPP joint and gastroc resection VS. achilles lengthing   . Amputation  04/25/2011    Procedure: AMPUTATION DIGIT;  Surgeon: Nadara Mustard, MD;  Location: [facility] OR;  Service: Orthopedics;  Laterality: Left;  Left foot 3rd toe amputation MTP joint, Gastroc Recession  Achilles Lengthening     No family history on file.  History  Substance Use Topics  . Smoking status: Current Every Day Smoker -- 0.25 packs/day for 1 years    Types: Cigarettes  . Smokeless tobacco: Never Used  . Alcohol Use: No      Review of Systems  All other systems reviewed and  are negative.    Allergies  Review of patient's allergies indicates no known allergies.  Home Medications   Current Outpatient Rx  Name  Route  Sig  Dispense  Refill  . lisinopril (PRINIVIL,ZESTRIL) 10 MG tablet   Oral   Take 2 tablets (20 mg total) by mouth daily.   14 tablet   0   . thiamine (VITAMIN B-1) 100 MG tablet   Oral   Take 100 mg by mouth daily.           BP 138/70  Pulse 82  Temp(Src) 99.9 F (37.7 C) (Oral)  Resp 20  SpO2 99%  Physical Exam  Nursing note and vitals reviewed. Constitutional: He appears well-developed and well-nourished. No distress.  HENT:  Head: Normocephalic and atraumatic.  Right Ear: External ear normal.  Left Ear: External ear normal.  Eyes: Conjunctivae are normal. Right eye exhibits no discharge. Left eye exhibits no discharge. No scleral icterus.  Neck: Neck supple. No tracheal deviation present.  Cardiovascular: Normal rate.   Pulmonary/Chest: Effort normal. No stridor. No respiratory distress.  Musculoskeletal: He exhibits tenderness. He exhibits no edema.  Status post amputation of toes left foot, left great toe with partial amputation, no purulence, small area of slight fluctuance at the tip of  his toe,  strong peripheral pulses, no calf tenderness, no erythema, mild tenderness of the distal lower leg  Neurological: He is alert. Cranial nerve deficit: no gross deficits.  Skin: Skin is warm and dry. No rash noted.  Psychiatric: He has a normal mood and affect.    ED Course  Procedures (including critical care time) Chlorhexidine was placed on the left great toe. Using an 18-gauge needle under sterile conditions the small area at the tip of his toe was aspirated. No purulent drainage.  No serous fluid.  Labs Reviewed  CBC WITH DIFFERENTIAL - Abnormal; Notable for the following:    RBC 3.86 (*)    Hemoglobin 11.7 (*)    HCT 34.9 (*)    All other components within normal limits  GLUCOSE, CAPILLARY    MDM   Pt exam  is reassuring.  No sign of acute vascular occlusion.  No definite signs of cellulitis, abscess.  Pt does have prior history of diabetes however now he no longer requires medications and blood sugar is normal.  Pt does stand on his feet a lot.  Suspect he might have some irritation from his shoes.  Will start him on oral abx in case he is developing an early cellulitis.       Celene Kras, MD 10/01/12 2128

## 2012-10-29 ENCOUNTER — Encounter (HOSPITAL_COMMUNITY): Payer: Self-pay | Admitting: *Deleted

## 2012-10-29 ENCOUNTER — Emergency Department (HOSPITAL_COMMUNITY)
Admission: EM | Admit: 2012-10-29 | Discharge: 2012-10-29 | Disposition: A | Discharge status: Home / Self Care | Payer: Medicaid Other | Attending: Emergency Medicine | Admitting: Emergency Medicine

## 2012-10-29 ENCOUNTER — Emergency Department (HOSPITAL_COMMUNITY): Payer: Medicaid Other

## 2012-10-29 DIAGNOSIS — S98139A Complete traumatic amputation of one unspecified lesser toe, initial encounter: Secondary | ICD-10-CM | POA: Insufficient documentation

## 2012-10-29 DIAGNOSIS — E1169 Type 2 diabetes mellitus with other specified complication: Secondary | ICD-10-CM | POA: Insufficient documentation

## 2012-10-29 DIAGNOSIS — I10 Essential (primary) hypertension: Secondary | ICD-10-CM | POA: Insufficient documentation

## 2012-10-29 DIAGNOSIS — F17200 Nicotine dependence, unspecified, uncomplicated: Secondary | ICD-10-CM | POA: Insufficient documentation

## 2012-10-29 DIAGNOSIS — M7989 Other specified soft tissue disorders: Secondary | ICD-10-CM | POA: Insufficient documentation

## 2012-10-29 DIAGNOSIS — Z8679 Personal history of other diseases of the circulatory system: Secondary | ICD-10-CM | POA: Insufficient documentation

## 2012-10-29 DIAGNOSIS — E11628 Type 2 diabetes mellitus with other skin complications: Secondary | ICD-10-CM

## 2012-10-29 DIAGNOSIS — S98119A Complete traumatic amputation of unspecified great toe, initial encounter: Secondary | ICD-10-CM | POA: Insufficient documentation

## 2012-10-29 DIAGNOSIS — Y835 Amputation of limb(s) as the cause of abnormal reaction of the patient, or of later complication, without mention of misadventure at the time of the procedure: Secondary | ICD-10-CM | POA: Insufficient documentation

## 2012-10-29 DIAGNOSIS — Z8739 Personal history of other diseases of the musculoskeletal system and connective tissue: Secondary | ICD-10-CM | POA: Insufficient documentation

## 2012-10-29 DIAGNOSIS — T8140XA Infection following a procedure, unspecified, initial encounter: Secondary | ICD-10-CM | POA: Insufficient documentation

## 2012-10-29 DIAGNOSIS — Z79899 Other long term (current) drug therapy: Secondary | ICD-10-CM | POA: Insufficient documentation

## 2012-10-29 LAB — GLUCOSE, CAPILLARY: Glucose-Capillary: 96 mg/dL (ref 70–99)

## 2012-10-29 MED ORDER — CEPHALEXIN 500 MG PO CAPS: 500.0000 mg | ORAL_CAPSULE | Freq: Three times a day (TID) | ORAL | Status: DC

## 2012-10-29 NOTE — ED Notes (Signed)
Pt states he was hospitalized 3 weeks ago for wound infection and he has a new cut on bottom of toe and he thinks it is infected again the pain is 12/10

## 2012-10-29 NOTE — ED Provider Notes (Signed)
History     CSN: [id number]  Arrival date & time 10/29/12  2041   First MD Initiated Contact with Patient 10/29/12 2104      Chief Complaint  Patient presents with  . Foot Pain   HPI  History provided by the patient. Patient is a 37 year old male with history of hypertension, diabetes, previous osteomyelitis and amputation of toes who presents with complaints of increasing pain and drainage from his remaining left great toe. Patient states that he was recently hospitalized with foot infection. He was treated for this with a 10 day course of antibiotics which she finished 1-1/2 weeks ago. He was doing well since that time but states he was playing [employer] call 2 days ago and since that time his had increased pain and slight swelling around his great toe. He also reports noticing some purulent drainage from the toe area. He is not using treatments for symptoms. He denies any erythematous streaks up the leg. Denies fever, chills or sweats. No other aggravating or alleviating factors. No other associated symptoms.    Past Medical History  Diagnosis Date  . Hypertension   . Diabetes mellitus     type 2 iddm x 18 yrs  . Vascular disease     poor circulation to left foot  . Acute osteomyelitis, ankle and foot 07/30/2010    Qualifier: Diagnosis of  By: Daiva Eves MD, Remi Haggard      Past Surgical History  Procedure Laterality Date  . Cholecystectomy    . Toe amputation  2012    left foot; great toe and second toe  . Amputation  04/15/2011    Procedure: AMPUTATION RAY;  Surgeon: Nadara Mustard, MD;  Location: [facility] OR;  Service: Orthopedics;  Laterality: Left;  left foot third toe amputation and MPP joint and gastroc resection VS. achilles lengthing   . Amputation  04/25/2011    Procedure: AMPUTATION DIGIT;  Surgeon: Nadara Mustard, MD;  Location: [facility] OR;  Service: Orthopedics;  Laterality: Left;  Left foot 3rd toe amputation MTP joint, Gastroc Recession  Achilles Lengthening     History reviewed. No  pertinent family history.  History  Substance Use Topics  . Smoking status: Current Every Day Smoker -- 0.25 packs/day for 1 years    Types: Cigarettes  . Smokeless tobacco: Never Used  . Alcohol Use: No      Review of Systems  Constitutional: Negative for fever, chills and diaphoresis.  All other systems reviewed and are negative.    Allergies  Review of patient's allergies indicates no known allergies.  Home Medications   Current Outpatient Rx  Name  Route  Sig  Dispense  Refill  . lisinopril (PRINIVIL,ZESTRIL) 10 MG tablet   Oral   Take 2 tablets (20 mg total) by mouth daily.   14 tablet   0     BP 138/87  Pulse 53  Temp(Src) 98.5 F (36.9 C) (Oral)  Resp 20  Ht 6' (1.829 m)  Wt 222 lb (100.699 kg)  BMI 30.1 kg/m2  SpO2 100%  Physical Exam  Nursing note and vitals reviewed. Constitutional: He is oriented to person, place, and time. He appears well-developed and well-nourished. No distress.  HENT:  Head: Normocephalic.  Cardiovascular: Normal rate and regular rhythm.   No murmur heard. Pulmonary/Chest: Effort normal and breath sounds normal. No respiratory distress. He has no wheezes. He has no rales.  Musculoskeletal: Normal range of motion.  Partial amputation of the left first toe. Complete amputation  of the left second and third toes. There is thick and callused dry skin to the remaining part of the left great toe with deep fissures on the plantar aspect. No active bleeding or drainage. There may be mild swelling with tenderness to palpation. No significant erythema. Mild increased warmth of the skin. Normal dorsal pedal pulses.  Neurological: He is alert and oriented to person, place, and time.  Skin: Skin is warm.  Psychiatric: He has a normal mood and affect. His behavior is normal.    ED Course  Procedures   Results for orders placed during the hospital encounter of 10/29/12  GLUCOSE, CAPILLARY      Result Value Range   Glucose-Capillary 96   70 - 99 mg/dL      Dg Foot Complete Left  10/29/2012   *RADIOLOGY REPORT*  Clinical Data: Swelling and pain about the left foot for 3 weeks.  LEFT FOOT - COMPLETE 3+ VIEW  Comparison: Left foot radiographs performed 10/13/2010  Findings: There is chronic absence of portions of the first, second, third and fifth toes.  Amputation at the second and third toes is new from prior study, and there is new erosion of the distal fifth toe.  There appears to have been some degree of healing with respect to erosion at the distal first toe. Underlying acute osteomyelitis cannot be excluded, but is difficult to fully characterize on radiograph.  Visualized joint spaces are otherwise preserved.  An os peroneum is noted.  Mild diffuse soft tissue swelling is noted.  IMPRESSION:  1.  Chronic absence of portions of the first, second, third and fifth toes; new amputation of the second and third toes, and new erosion of the distal fifth toe.  Some degree of healing with respect to prior erosion at the distal first toe; underlying acute osteomyelitis cannot be excluded, but is not well characterized on radiograph. 2.  Os peroneum noted.   Original Report Authenticated By: Tonia Ghent, [name]     1. Diabetic infection of left foot       MDM  9:15PM patient seen and evaluated. Patient well-appearing in no acute distress. He does not appear severely ill or toxic. Afebrile.        Angus Seller, PA-C 10/30/12 [phone]

## 2012-10-30 NOTE — ED Provider Notes (Signed)
Medical screening examination/treatment/procedure(s) were performed by non-physician practitioner and as supervising physician I was immediately available for consultation/collaboration.    Adonai Helzer R Elver Stadler, MD 10/30/12 1556 

## 2012-11-12 ENCOUNTER — Encounter (HOSPITAL_BASED_OUTPATIENT_CLINIC_OR_DEPARTMENT_OTHER): Payer: Medicaid Other | Attending: General Surgery

## 2012-11-12 DIAGNOSIS — L84 Corns and callosities: Secondary | ICD-10-CM | POA: Insufficient documentation

## 2012-11-12 DIAGNOSIS — X58XXXA Exposure to other specified factors, initial encounter: Secondary | ICD-10-CM | POA: Insufficient documentation

## 2012-11-12 DIAGNOSIS — E1142 Type 2 diabetes mellitus with diabetic polyneuropathy: Secondary | ICD-10-CM | POA: Insufficient documentation

## 2012-11-12 DIAGNOSIS — I10 Essential (primary) hypertension: Secondary | ICD-10-CM | POA: Insufficient documentation

## 2012-11-12 DIAGNOSIS — I739 Peripheral vascular disease, unspecified: Secondary | ICD-10-CM | POA: Insufficient documentation

## 2012-11-12 DIAGNOSIS — S98139A Complete traumatic amputation of one unspecified lesser toe, initial encounter: Secondary | ICD-10-CM | POA: Insufficient documentation

## 2012-11-12 DIAGNOSIS — E1149 Type 2 diabetes mellitus with other diabetic neurological complication: Secondary | ICD-10-CM | POA: Insufficient documentation

## 2012-11-12 DIAGNOSIS — S8990XA Unspecified injury of unspecified lower leg, initial encounter: Secondary | ICD-10-CM | POA: Insufficient documentation

## 2012-11-12 DIAGNOSIS — L97509 Non-pressure chronic ulcer of other part of unspecified foot with unspecified severity: Secondary | ICD-10-CM | POA: Insufficient documentation

## 2012-11-12 LAB — GLUCOSE, CAPILLARY: Glucose-Capillary: 115 mg/dL — ABNORMAL HIGH (ref 70–99)

## 2012-11-13 NOTE — Progress Notes (Signed)
[facility]  NAME:  [name], [name]              ACCOUNT NO.:  [id number]  MEDICAL RECORD NO.:  [id number]      DATE OF BIRTH:  1975-08-29  PHYSICIAN:  Maxwell Caul, [name] VISIT DATE:  11/12/2012                                  OFFICE VISIT   HISTORY OF PRESENT ILLNESS:  This is a 37 year old type 2 diabetic, who apparently is not on any current treatment, although he was recently on Levemir.  He was hospitalized in April for acute pancreatitis, although just seems to have been a short admission.  He was seen in [facility], and I believe referred here for wounds on his remaining left first toe and an extensive area over the right plantar first metatarsal head.  From the records from the urgent care visit on May 23, he states he was recently hospitalized with a foot infection, although I do not see this in the [facility] and finished a course of antibiotics. On that day, he was there, he was playing baseball and had pain and swelling around the toe as well as some drainage.  He has had a history of osteomyelitis involving the ankle and foot in 2012.  PAST MEDICAL HISTORY:  Type 2 diabetes with diabetic neuropathy, hypertension, peripheral vascular disease, and acute osteomyelitis.  PAST SURGICAL HISTORY:  Cholecystectomy and partial great toe amputation on the left as well as amputations of his left 2nd and 3rd toes in 2012.  IMAGING DATA:  X-ray on Oct 29, 2012, documented previous amputation, it was felt to be some degree of healing with respect to an erosion of the left first toe.  Underlying acute osteomyelitis cannot not be well characterized.  Also erosions noted in the distal left 5th toe.  PHYSICAL EXAMINATION:  VITAL SIGNS:  His temperature was 98.2, pulse is 80, respirations 16, blood pressure 140/89, CBG was 115. VASCULAR ASSESSMENT:  He actually had a brisk dorsalis pedis pulses. ABIs were normal on the left at 1.12 and normal on  the right at 1.17. The 2 areas in question were on the plantar first metatarsal head, which had a thick jagged callus measuring 3.5 x 2.5 x 0.3.  The remnant of his left great toe, which was an amputation through the PIP was covered in a thick black eschar with a small ulcer showing on the plantar aspect. Both of these areas underwent extensive surgical debridements removing thick redundant callus, nonviable subcutaneous tissue.  On the right plantar 1st metatarsal head, a eventual wound was the revealed, however, this appears to be clean.  Further debridement of this area is likely to be necessary.  The remnant of his left 1st toe also underwent an extensive removal of the eschar, nonviable subcutaneous tissue.  The open areas on the plantar aspect.  I did not re-culture this nor did I ordered further antibiotics.  IMPRESSIONS:  Probable Wegener's 3 wounds at least on the left first toe possibly on the right as well.  After extensive surgical debridements, we applied this silver alginate.  The plantar right foot was off-loaded in a Darco sandal and a healing sandal.  It is quite reasonable to consider doing an MRI especially on the left foot.  I did not re-culture and did not add to his antibiotics.  I have reviewed [facility] other than the x-ray, I do not see any relevant microbiology nor do I see prior vascular assessments, although at the bed side I think his vascular supply is quite normal.  Type 2 diabetes, recently on insulin.  He is not on insulin now only lisinopril.  As noted above, his blood sugar was actually 115.  We will see this gentleman in a week's time.  I think he is going to need further debridement of these areas.  Further imaging studies may be necessary.  He is neuropathic, but I do not think there is any evidence of a significant macrovascular component to this.          ______________________________ Maxwell Caul, [name]     MGR/MEDQ  D:   11/12/2012  T:  11/13/2012  Job:  [id number]

## 2012-12-17 ENCOUNTER — Encounter (HOSPITAL_BASED_OUTPATIENT_CLINIC_OR_DEPARTMENT_OTHER): Payer: Medicaid Other | Attending: General Surgery

## 2012-12-17 DIAGNOSIS — L84 Corns and callosities: Secondary | ICD-10-CM | POA: Insufficient documentation

## 2012-12-17 DIAGNOSIS — L97509 Non-pressure chronic ulcer of other part of unspecified foot with unspecified severity: Secondary | ICD-10-CM | POA: Insufficient documentation

## 2012-12-17 DIAGNOSIS — E1169 Type 2 diabetes mellitus with other specified complication: Secondary | ICD-10-CM | POA: Insufficient documentation

## 2013-01-28 ENCOUNTER — Other Ambulatory Visit (HOSPITAL_BASED_OUTPATIENT_CLINIC_OR_DEPARTMENT_OTHER): Payer: Self-pay | Admitting: General Surgery

## 2013-01-28 ENCOUNTER — Encounter (HOSPITAL_BASED_OUTPATIENT_CLINIC_OR_DEPARTMENT_OTHER): Payer: Medicaid Other | Attending: General Surgery

## 2013-01-28 ENCOUNTER — Ambulatory Visit (HOSPITAL_COMMUNITY)
Admission: RE | Admit: 2013-01-28 | Discharge: 2013-01-28 | Disposition: A | Discharge status: Home / Self Care | Payer: Medicaid Other | Source: Ambulatory Visit | Attending: General Surgery | Admitting: General Surgery

## 2013-01-28 DIAGNOSIS — E119 Type 2 diabetes mellitus without complications: Secondary | ICD-10-CM | POA: Insufficient documentation

## 2013-01-28 DIAGNOSIS — M869 Osteomyelitis, unspecified: Secondary | ICD-10-CM

## 2013-01-28 DIAGNOSIS — L97509 Non-pressure chronic ulcer of other part of unspecified foot with unspecified severity: Secondary | ICD-10-CM | POA: Insufficient documentation

## 2013-01-28 DIAGNOSIS — L84 Corns and callosities: Secondary | ICD-10-CM | POA: Insufficient documentation

## 2013-01-28 DIAGNOSIS — S8990XA Unspecified injury of unspecified lower leg, initial encounter: Secondary | ICD-10-CM | POA: Insufficient documentation

## 2013-01-28 DIAGNOSIS — E1169 Type 2 diabetes mellitus with other specified complication: Secondary | ICD-10-CM | POA: Insufficient documentation

## 2013-01-28 DIAGNOSIS — X58XXXA Exposure to other specified factors, initial encounter: Secondary | ICD-10-CM | POA: Insufficient documentation

## 2013-02-08 ENCOUNTER — Encounter (HOSPITAL_BASED_OUTPATIENT_CLINIC_OR_DEPARTMENT_OTHER): Payer: Medicaid Other | Attending: General Surgery

## 2013-02-08 DIAGNOSIS — L97509 Non-pressure chronic ulcer of other part of unspecified foot with unspecified severity: Secondary | ICD-10-CM | POA: Insufficient documentation

## 2013-02-08 DIAGNOSIS — L84 Corns and callosities: Secondary | ICD-10-CM | POA: Insufficient documentation

## 2013-02-08 DIAGNOSIS — L97409 Non-pressure chronic ulcer of unspecified heel and midfoot with unspecified severity: Secondary | ICD-10-CM | POA: Insufficient documentation

## 2013-02-08 DIAGNOSIS — E1169 Type 2 diabetes mellitus with other specified complication: Secondary | ICD-10-CM | POA: Insufficient documentation

## 2013-02-15 ENCOUNTER — Other Ambulatory Visit (HOSPITAL_BASED_OUTPATIENT_CLINIC_OR_DEPARTMENT_OTHER): Payer: Self-pay | Admitting: General Surgery

## 2013-02-15 DIAGNOSIS — R609 Edema, unspecified: Secondary | ICD-10-CM

## 2013-02-15 DIAGNOSIS — R52 Pain, unspecified: Secondary | ICD-10-CM

## 2013-02-18 ENCOUNTER — Emergency Department (INDEPENDENT_AMBULATORY_CARE_PROVIDER_SITE_OTHER)
Admission: EM | Admit: 2013-02-18 | Discharge: 2013-02-18 | Disposition: A | Discharge status: Home / Self Care | Payer: Medicaid Other | Source: Home / Self Care | Attending: Family Medicine | Admitting: Family Medicine

## 2013-02-18 ENCOUNTER — Encounter (HOSPITAL_COMMUNITY): Payer: Self-pay | Admitting: Emergency Medicine

## 2013-02-18 DIAGNOSIS — E11621 Type 2 diabetes mellitus with foot ulcer: Secondary | ICD-10-CM

## 2013-02-18 DIAGNOSIS — N189 Chronic kidney disease, unspecified: Secondary | ICD-10-CM

## 2013-02-18 DIAGNOSIS — N179 Acute kidney failure, unspecified: Secondary | ICD-10-CM

## 2013-02-18 DIAGNOSIS — L97509 Non-pressure chronic ulcer of other part of unspecified foot with unspecified severity: Secondary | ICD-10-CM

## 2013-02-18 DIAGNOSIS — E1169 Type 2 diabetes mellitus with other specified complication: Secondary | ICD-10-CM

## 2013-02-18 DIAGNOSIS — I10 Essential (primary) hypertension: Secondary | ICD-10-CM

## 2013-02-18 LAB — POCT I-STAT, CHEM 8
Creatinine, Ser: 1.6 mg/dL — ABNORMAL HIGH (ref 0.50–1.35)
Glucose, Bld: 114 mg/dL — ABNORMAL HIGH (ref 70–99)
HCT: 45 % (ref 39.0–52.0)
Hemoglobin: 15.3 g/dL (ref 13.0–17.0)
TCO2: 30 mmol/L (ref 0–100)

## 2013-02-18 MED ORDER — CEPHALEXIN 500 MG PO CAPS: 500.0000 mg | ORAL_CAPSULE | Freq: Three times a day (TID) | ORAL | Status: DC

## 2013-02-18 MED ORDER — LISINOPRIL 10 MG PO TABS: 10.0000 mg | ORAL_TABLET | Freq: Every day | ORAL | Status: DC

## 2013-02-18 MED ORDER — HYDROCODONE-ACETAMINOPHEN 5-325 MG PO TABS: 2.0000 | ORAL_TABLET | ORAL | Status: DC | PRN

## 2013-02-18 NOTE — ED Notes (Deleted)
Pt here for f/u packing removal from right side of face. Pt denies pain or fever. Wound has been draining. Pt has no concerns. Jan Ranson, SMA

## 2013-02-18 NOTE — ED Notes (Addendum)
Pt c/o diabetic ulcers on both feet x 1 week with swelling and drainage. Pt states he is here for a second opinion on left foot. He has been diagnosed and treated at [facility].  Pt states he is out of Lisinopril for High Blood Pressure and would like a refill. Jan Ranson, SMA

## 2013-02-18 NOTE — ED Notes (Signed)
Ed notes filed under wrong patient by [employer]

## 2013-02-18 NOTE — ED Provider Notes (Signed)
CSN: [id number]     Arrival date & time 02/18/13  1001 History   First MD Initiated Contact with Patient 02/18/13 1031     Chief Complaint  Patient presents with  . Diabetic Ulcer  . Hypertension   (Consider location/radiation/quality/duration/timing/severity/associated sxs/prior Treatment) HPI Comments: 38 year old male with history of poorly controlled diabetes and left food pressure sores and has history of osteomyelitis with several amputations. Here requesting refill on his blood pressure medications. Patient reports intermittent headaches he also had 3 tooth pulled in the last week but denies any gum swelling or drainage. State he ran out of pain medications. He also wants me to check his left toe wound and reports increased pain from baseline in his left toe. He is being followed at the wound care center and has an appointment in 4 days. He also has a MRI scheduled prior to his next appointment. He states his pain in the left toe is worse but denies any changes in swelling states drainage is persistent. Patient says he was told " the toe will be amputated" and wants a second opinion. Denies fever or chills. Denies abdominal pain nausea vomiting or diarrhea. Patient states that he has not taken any diabetes medications after his hospital discharge in May as he was instructed to only follow a diabetic diet on discharge.  States he ran out of his blood pressure medications over a week ago. Denies chest pain, blurry vision, balance problems. Patient does not have a primary care provider.    Past Medical History  Diagnosis Date  . Hypertension   . Diabetes mellitus     type 2 iddm x 18 yrs  . Vascular disease     poor circulation to left foot  . Acute osteomyelitis, ankle and foot 07/30/2010    Qualifier: Diagnosis of  By: Daiva Eves MD, Remi Haggard     Past Surgical History  Procedure Laterality Date  . Cholecystectomy    . Toe amputation  2012    left foot; great toe and second toe  .  Amputation  04/15/2011    Procedure: AMPUTATION RAY;  Surgeon: Nadara Mustard, MD;  Location: [facility] OR;  Service: Orthopedics;  Laterality: Left;  left foot third toe amputation and MPP joint and gastroc resection VS. achilles lengthing   . Amputation  04/25/2011    Procedure: AMPUTATION DIGIT;  Surgeon: Nadara Mustard, MD;  Location: [facility] OR;  Service: Orthopedics;  Laterality: Left;  Left foot 3rd toe amputation MTP joint, Gastroc Recession  Achilles Lengthening    No family history on file. History  Substance Use Topics  . Smoking status: Current Every Day Smoker -- 0.25 packs/day for 1 years    Types: Cigarettes  . Smokeless tobacco: Never Used  . Alcohol Use: No    Review of Systems  Constitutional: Negative for fever, chills, diaphoresis, activity change, appetite change and fatigue.  Gastrointestinal: Negative for nausea, vomiting and abdominal pain.  Endocrine: Negative for polydipsia, polyphagia and polyuria.  Skin: Positive for wound.  Neurological: Positive for headaches. Negative for dizziness.    Allergies  Review of patient's allergies indicates no known allergies.  Home Medications   Current Outpatient Rx  Name  Route  Sig  Dispense  Refill  . cephALEXin (KEFLEX) 500 MG capsule   Oral   Take 1 capsule (500 mg total) by mouth 3 (three) times daily.   21 capsule   0   . HYDROcodone-acetaminophen (NORCO/VICODIN) 5-325 MG per tablet   Oral  Take 2 tablets by mouth every 4 (four) hours as needed for pain.   15 tablet   0   . lisinopril (PRINIVIL,ZESTRIL) 10 MG tablet   Oral   Take 1 tablet (10 mg total) by mouth daily.   30 tablet   0    BP 138/93  Pulse 74  Temp(Src) 97.6 F (36.4 C) (Oral)  Resp 16  SpO2 100% Physical Exam  Nursing note and vitals reviewed. Constitutional: He is oriented to person, place, and time. He appears well-developed and well-nourished. No distress.  HENT:  Head: Normocephalic and atraumatic.  Mouth/Throat: Oropharynx is clear  and moist.  Eyes: Conjunctivae and EOM are normal. Pupils are equal, round, and reactive to light. No scleral icterus.  Neck: No JVD present.  Cardiovascular: Normal rate, regular rhythm, normal heart sounds and intact distal pulses.  Exam reveals no gallop and no friction rub.   No murmur heard. Pulmonary/Chest: Effort normal and breath sounds normal. He has no rales.  Abdominal: Soft. There is no tenderness.  Lymphadenopathy:    He has no cervical adenopathy.  Neurological: He is alert and oriented to person, place, and time. He displays normal reflexes. No cranial nerve deficit. He exhibits normal muscle tone. Coordination normal.  Skin: He is not diaphoretic.  left foot: several partially amputated toes. The first toe is partially amputated and has a pressure sore distal and inferior to the stump. There is transudate drainage and impress mild associated swelling. Patient report significant tenderness with palpation. I do not feel fluctuations and there is no redness increased temperature or purulent drainage.      ED Course  Procedures (including critical care time) Labs Review Labs Reviewed  POCT I-STAT, CHEM 8 - Abnormal; Notable for the following:    BUN 29 (*)    Creatinine, Ser 1.60 (*)    Glucose, Bld 114 (*)    Calcium, Ion 1.24 (*)    All other components within normal limits   Imaging Review No results found.  MDM   1. Hypertension   2. Diabetic foot ulcer   3. Renal failure (ARF), acute on chronic    #1) restarted it lisinopril to a lower dose 10 mg daily due to to patient creatinine being 1.6 on i-STAT today. #2) prescribed Keflex for possible infection although it looks like chronic findings on examination.. Recommended to keep followup appointment and wound care Center and scheduled left foot MRI appointment before. Prescribed Norco 325/5 mg #15 tablets for pain. #3) impress acute on chronic renal failure likely related to diabetes and uncontrolled hypertension as  he has not been taking his blood pressure medications for weeks. Creatinine between 1.2-1.4 in the last 4 months. Patient aware of this finding and about the recommendation to followup in 1-2 weeks here or at a PCP office. I personally scheduled a followup and new patient appointment to stablish care at [facility] on September 24 at 2:00 p.m Supportive care and red flags that should prompt his return to medical attention discussed with patient and provided in writing.   Sharin Grave, MD 02/19/13 1034

## 2013-02-21 ENCOUNTER — Ambulatory Visit (HOSPITAL_COMMUNITY)
Admission: RE | Admit: 2013-02-21 | Discharge: 2013-02-21 | Disposition: A | Discharge status: Home / Self Care | Payer: Medicaid Other | Source: Ambulatory Visit | Attending: General Surgery | Admitting: General Surgery

## 2013-02-21 DIAGNOSIS — R52 Pain, unspecified: Secondary | ICD-10-CM

## 2013-02-21 DIAGNOSIS — L03039 Cellulitis of unspecified toe: Secondary | ICD-10-CM | POA: Insufficient documentation

## 2013-02-21 DIAGNOSIS — S98139A Complete traumatic amputation of one unspecified lesser toe, initial encounter: Secondary | ICD-10-CM | POA: Insufficient documentation

## 2013-02-21 DIAGNOSIS — R609 Edema, unspecified: Secondary | ICD-10-CM

## 2013-02-21 DIAGNOSIS — X58XXXA Exposure to other specified factors, initial encounter: Secondary | ICD-10-CM | POA: Insufficient documentation

## 2013-02-21 DIAGNOSIS — S8990XA Unspecified injury of unspecified lower leg, initial encounter: Secondary | ICD-10-CM | POA: Insufficient documentation

## 2013-02-21 DIAGNOSIS — L02619 Cutaneous abscess of unspecified foot: Secondary | ICD-10-CM | POA: Insufficient documentation

## 2013-02-21 MED ORDER — GADOBENATE DIMEGLUMINE 529 MG/ML IV SOLN
20.0000 mL | Freq: Once | INTRAVENOUS | Status: AC | PRN
  Administered 2013-02-21: 20 mL via INTRAVENOUS

## 2013-03-02 ENCOUNTER — Ambulatory Visit: Payer: Medicaid Other

## 2013-03-08 ENCOUNTER — Other Ambulatory Visit (HOSPITAL_BASED_OUTPATIENT_CLINIC_OR_DEPARTMENT_OTHER): Payer: Self-pay | Admitting: General Surgery

## 2013-03-08 ENCOUNTER — Ambulatory Visit (HOSPITAL_COMMUNITY)
Admission: RE | Admit: 2013-03-08 | Discharge: 2013-03-08 | Disposition: A | Discharge status: Home / Self Care | Payer: Medicaid Other | Source: Ambulatory Visit | Attending: General Surgery | Admitting: General Surgery

## 2013-03-08 DIAGNOSIS — M869 Osteomyelitis, unspecified: Secondary | ICD-10-CM

## 2013-03-08 DIAGNOSIS — L97509 Non-pressure chronic ulcer of other part of unspecified foot with unspecified severity: Secondary | ICD-10-CM | POA: Insufficient documentation

## 2013-03-15 ENCOUNTER — Encounter (HOSPITAL_BASED_OUTPATIENT_CLINIC_OR_DEPARTMENT_OTHER): Payer: Medicaid Other | Attending: General Surgery

## 2013-03-15 DIAGNOSIS — L97509 Non-pressure chronic ulcer of other part of unspecified foot with unspecified severity: Secondary | ICD-10-CM | POA: Insufficient documentation

## 2013-03-15 DIAGNOSIS — L84 Corns and callosities: Secondary | ICD-10-CM | POA: Insufficient documentation

## 2013-03-15 DIAGNOSIS — E1169 Type 2 diabetes mellitus with other specified complication: Secondary | ICD-10-CM | POA: Insufficient documentation

## 2013-03-15 DIAGNOSIS — L089 Local infection of the skin and subcutaneous tissue, unspecified: Secondary | ICD-10-CM | POA: Insufficient documentation

## 2013-03-18 ENCOUNTER — Other Ambulatory Visit (HOSPITAL_COMMUNITY): Payer: Self-pay | Admitting: Orthopedic Surgery

## 2013-03-22 ENCOUNTER — Encounter (HOSPITAL_COMMUNITY): Payer: Self-pay | Admitting: Emergency Medicine

## 2013-03-22 ENCOUNTER — Inpatient Hospital Stay (HOSPITAL_COMMUNITY)
Admission: EM | Admit: 2013-03-22 | Discharge: 2013-03-28 | DRG: 617 | Disposition: A | Discharge status: Home / Self Care | Payer: Medicaid Other | Attending: Internal Medicine | Admitting: Internal Medicine

## 2013-03-22 ENCOUNTER — Emergency Department (HOSPITAL_COMMUNITY): Payer: Medicaid Other

## 2013-03-22 ENCOUNTER — Encounter (HOSPITAL_COMMUNITY): Payer: Self-pay

## 2013-03-22 DIAGNOSIS — IMO0002 Reserved for concepts with insufficient information to code with codable children: Principal | ICD-10-CM | POA: Diagnosis present

## 2013-03-22 DIAGNOSIS — A4101 Sepsis due to Methicillin susceptible Staphylococcus aureus: Secondary | ICD-10-CM

## 2013-03-22 DIAGNOSIS — N183 Chronic kidney disease, stage 3 unspecified: Secondary | ICD-10-CM | POA: Diagnosis present

## 2013-03-22 DIAGNOSIS — I10 Essential (primary) hypertension: Secondary | ICD-10-CM | POA: Diagnosis present

## 2013-03-22 DIAGNOSIS — E1165 Type 2 diabetes mellitus with hyperglycemia: Secondary | ICD-10-CM | POA: Diagnosis present

## 2013-03-22 DIAGNOSIS — A084 Viral intestinal infection, unspecified: Secondary | ICD-10-CM

## 2013-03-22 DIAGNOSIS — M9080 Osteopathy in diseases classified elsewhere, unspecified site: Secondary | ICD-10-CM | POA: Diagnosis present

## 2013-03-22 DIAGNOSIS — B351 Tinea unguium: Secondary | ICD-10-CM

## 2013-03-22 DIAGNOSIS — L97509 Non-pressure chronic ulcer of other part of unspecified foot with unspecified severity: Secondary | ICD-10-CM

## 2013-03-22 DIAGNOSIS — N058 Unspecified nephritic syndrome with other morphologic changes: Secondary | ICD-10-CM | POA: Diagnosis present

## 2013-03-22 DIAGNOSIS — E1169 Type 2 diabetes mellitus with other specified complication: Secondary | ICD-10-CM

## 2013-03-22 DIAGNOSIS — N179 Acute kidney failure, unspecified: Secondary | ICD-10-CM | POA: Diagnosis present

## 2013-03-22 DIAGNOSIS — M869 Osteomyelitis, unspecified: Secondary | ICD-10-CM | POA: Diagnosis present

## 2013-03-22 DIAGNOSIS — L03039 Cellulitis of unspecified toe: Secondary | ICD-10-CM | POA: Diagnosis present

## 2013-03-22 DIAGNOSIS — E1129 Type 2 diabetes mellitus with other diabetic kidney complication: Secondary | ICD-10-CM | POA: Diagnosis present

## 2013-03-22 DIAGNOSIS — L02619 Cutaneous abscess of unspecified foot: Secondary | ICD-10-CM | POA: Diagnosis present

## 2013-03-22 DIAGNOSIS — K859 Acute pancreatitis without necrosis or infection, unspecified: Secondary | ICD-10-CM

## 2013-03-22 DIAGNOSIS — F17200 Nicotine dependence, unspecified, uncomplicated: Secondary | ICD-10-CM | POA: Diagnosis present

## 2013-03-22 DIAGNOSIS — E1151 Type 2 diabetes mellitus with diabetic peripheral angiopathy without gangrene: Secondary | ICD-10-CM

## 2013-03-22 DIAGNOSIS — D649 Anemia, unspecified: Secondary | ICD-10-CM | POA: Diagnosis present

## 2013-03-22 DIAGNOSIS — R112 Nausea with vomiting, unspecified: Secondary | ICD-10-CM

## 2013-03-22 DIAGNOSIS — I129 Hypertensive chronic kidney disease with stage 1 through stage 4 chronic kidney disease, or unspecified chronic kidney disease: Secondary | ICD-10-CM | POA: Diagnosis present

## 2013-03-22 DIAGNOSIS — M86179 Other acute osteomyelitis, unspecified ankle and foot: Secondary | ICD-10-CM

## 2013-03-22 DIAGNOSIS — N189 Chronic kidney disease, unspecified: Secondary | ICD-10-CM | POA: Diagnosis present

## 2013-03-22 DIAGNOSIS — E11621 Type 2 diabetes mellitus with foot ulcer: Secondary | ICD-10-CM

## 2013-03-22 DIAGNOSIS — Z794 Long term (current) use of insulin: Secondary | ICD-10-CM | POA: Diagnosis present

## 2013-03-22 DIAGNOSIS — E119 Type 2 diabetes mellitus without complications: Secondary | ICD-10-CM

## 2013-03-22 DIAGNOSIS — L97511 Non-pressure chronic ulcer of other part of right foot limited to breakdown of skin: Secondary | ICD-10-CM | POA: Diagnosis present

## 2013-03-22 DIAGNOSIS — F1210 Cannabis abuse, uncomplicated: Secondary | ICD-10-CM | POA: Diagnosis present

## 2013-03-22 LAB — CBC WITH DIFFERENTIAL/PLATELET
Basophils Relative: 0 % (ref 0–1)
Eosinophils Absolute: 0.3 10*3/uL (ref 0.0–0.7)
Eosinophils Relative: 2 % (ref 0–5)
HCT: 31.8 % — ABNORMAL LOW (ref 39.0–52.0)
Hemoglobin: 11.2 g/dL — ABNORMAL LOW (ref 13.0–17.0)
MCH: 31.4 pg (ref 26.0–34.0)
MCHC: 35.2 g/dL (ref 30.0–36.0)
MCV: 89.1 fL (ref 78.0–100.0)
Monocytes Absolute: 1 10*3/uL (ref 0.1–1.0)
Monocytes Relative: 9 % (ref 3–12)

## 2013-03-22 LAB — COMPREHENSIVE METABOLIC PANEL
Albumin: 3 g/dL — ABNORMAL LOW (ref 3.5–5.2)
BUN: 27 mg/dL — ABNORMAL HIGH (ref 6–23)
Calcium: 8.8 mg/dL (ref 8.4–10.5)
Chloride: 101 mEq/L (ref 96–112)
Creatinine, Ser: 2.11 mg/dL — ABNORMAL HIGH (ref 0.50–1.35)
Total Bilirubin: 0.3 mg/dL (ref 0.3–1.2)

## 2013-03-22 LAB — GLUCOSE, CAPILLARY: Glucose-Capillary: 180 mg/dL — ABNORMAL HIGH (ref 70–99)

## 2013-03-22 MED ORDER — MORPHINE SULFATE 4 MG/ML IJ SOLN
4.0000 mg | Freq: Once | INTRAMUSCULAR | Status: AC
  Administered 2013-03-22: 4 mg via INTRAVENOUS
  Filled 2013-03-22: qty 1

## 2013-03-22 MED ORDER — SODIUM CHLORIDE 0.9 % IV SOLN
INTRAVENOUS | Status: DC
  Administered 2013-03-23 – 2013-03-26 (×4): via INTRAVENOUS

## 2013-03-22 MED ORDER — PIPERACILLIN-TAZOBACTAM 3.375 G IVPB
3.3750 g | Freq: Three times a day (TID) | INTRAVENOUS | Status: DC
  Administered 2013-03-23 – 2013-03-28 (×17): 3.375 g via INTRAVENOUS
  Filled 2013-03-22 (×19): qty 50

## 2013-03-22 MED ORDER — VANCOMYCIN HCL 10 G IV SOLR
2500.0000 mg | Freq: Once | INTRAVENOUS | Status: AC
  Administered 2013-03-22: 2500 mg via INTRAVENOUS
  Filled 2013-03-22: qty 2500

## 2013-03-22 MED ORDER — SODIUM CHLORIDE 0.9 % IV SOLN
1500.0000 mg | INTRAVENOUS | Status: DC
  Administered 2013-03-23: 1500 mg via INTRAVENOUS
  Filled 2013-03-22 (×2): qty 1500

## 2013-03-22 MED ORDER — HEPARIN SODIUM (PORCINE) 5000 UNIT/ML IJ SOLN
5000.0000 [IU] | Freq: Three times a day (TID) | INTRAMUSCULAR | Status: DC
  Administered 2013-03-23 – 2013-03-28 (×16): 5000 [IU] via SUBCUTANEOUS
  Filled 2013-03-22 (×19): qty 1

## 2013-03-22 MED ORDER — SODIUM CHLORIDE 0.9 % IV BOLUS (SEPSIS): 500.0000 mL | Freq: Once | INTRAVENOUS | Status: DC

## 2013-03-22 MED ORDER — INSULIN ASPART 100 UNIT/ML ~~LOC~~ SOLN
0.0000 [IU] | SUBCUTANEOUS | Status: DC
  Administered 2013-03-23 (×2): via SUBCUTANEOUS

## 2013-03-22 MED ORDER — VANCOMYCIN HCL 1000 MG IV SOLR
15.0000 mg/kg | Freq: Once | INTRAVENOUS | Status: DC
  Administered 2013-03-22: 22:00:00 via INTRAVENOUS

## 2013-03-22 MED ORDER — PIPERACILLIN-TAZOBACTAM 3.375 G IVPB 30 MIN
3.3750 g | Freq: Once | INTRAVENOUS | Status: DC
  Filled 2013-03-22: qty 50

## 2013-03-22 NOTE — Progress Notes (Signed)
ANTIBIOTIC CONSULT NOTE - INITIAL  Pharmacy Consult for vancomycin and zosyn Indication: foot osteomyelitis  No Known Allergies  Patient Measurements: weight 109 kg, height 73 inches (per patient)    Vital Signs: Temp: 98.3 F (36.8 C) (10/14 2051) Temp src: Oral (10/14 2051) BP: 120/63 mmHg (10/14 2145) Pulse Rate: 87 (10/14 2145) Intake/Output from previous day:   Intake/Output from this shift:    Labs:  Recent Labs  03/22/13 1848  WBC 11.5*  HGB 11.2*  PLT 191  CREATININE 2.11*   The CrCl is unknown because both a height and weight (above a minimum accepted value) are required for this calculation. No results found for this basename: VANCOTROUGH, VANCOPEAK, VANCORANDOM, GENTTROUGH, GENTPEAK, GENTRANDOM, TOBRATROUGH, TOBRAPEAK, TOBRARND, AMIKACINPEAK, AMIKACINTROU, AMIKACIN,  in the last 72 hours   Microbiology: No results found for this or any previous visit (from the past 720 hour(s)).  Medical History: Past Medical History  Diagnosis Date  . Hypertension   . Diabetes mellitus     type 2 iddm x 18 yrs  . Vascular disease     poor circulation to left foot  . Acute osteomyelitis, ankle and foot 07/30/2010    Qualifier: Diagnosis of  By: Daiva Eves MD, Remi Haggard      Medications:  See med rec   Assessment: Patient is a 37 y.o M with hx of poorly controlled diabetes and osteomyelitis from foot ulcers.  He had his left foot great toe and second toe amputated in 2012.  He presented to the ED today with c/o worsening of pain and swelling in his feet.  Patient stated that he is scheduled to have his infected toes amputated this Friday by Dr. Lajoyce Corners.  To start broad antibiotic for foot osteomyelitis.   Goal of Therapy:  Vancomycin trough level 15-20 mcg/ml  Plan:  1) vancomycin 2500 mg IV x1 bolus, then 1500mg  IV q24h 2) zosyn 3.375gm Iv x1 over 30 minutes, then zosyn 3.375gm IV q8h (infuse over 4 hours) 3) check vancomycin level at steady state  Lashonna Rieke  P 03/22/2013,9:58 PM

## 2013-03-22 NOTE — ED Notes (Signed)
Family at bedside. 

## 2013-03-22 NOTE — ED Provider Notes (Signed)
CSN: [id number]     Arrival date & time 03/22/13  1830 History   First MD Initiated Contact with Patient 03/22/13 2053     Chief Complaint  Patient presents with  . Foot Pain   (Consider location/radiation/quality/duration/timing/severity/associated sxs/prior Treatment) HPI Comments: 37 yo male with no primary doctor, hx of sepsis, osteo, uncontrolled DM, toe amputation presents with known foot ulcers and plan for amputation on Friday however pt developed fevers, warmth and pus draining the past two days. Sent over for IV abx.  Dr Lajoyce Corners ortho.  Nothing improves.  Pt not on abx currently.  Pain with palpation.    Patient is a 37 y.o. male presenting with lower extremity pain. The history is provided by the patient.  Foot Pain This is a recurrent problem. Pertinent negatives include no chest pain, no abdominal pain, no headaches and no shortness of breath.    Past Medical History  Diagnosis Date  . Hypertension   . Diabetes mellitus     type 2 iddm x 18 yrs  . Vascular disease     poor circulation to left foot  . Acute osteomyelitis, ankle and foot 07/30/2010    Qualifier: Diagnosis of  By: Daiva Eves MD, Remi Haggard     Past Surgical History  Procedure Laterality Date  . Cholecystectomy    . Toe amputation  2012    left foot; great toe and second toe  . Amputation  04/15/2011    Procedure: AMPUTATION RAY;  Surgeon: Nadara Mustard, MD;  Location[facility];  Service: Orthopedics;  Laterality: Left;  left foot third toe amputation and MPP joint and gastroc resection VS. achilles lengthing   . Amputation  04/25/2011    Procedure: AMPUTATION DIGIT;  Surgeon: Nadara Mustard, MD;  Location: [facility] OR;  Service: Orthopedics;  Laterality: Left;  Left foot 3rd toe amputation MTP joint, Gastroc Recession  Achilles Lengthening    History reviewed. No pertinent family history. History  Substance Use Topics  . Smoking status: Current Every Day Smoker -- 0.25 packs/day for 1 years    Types: Cigarettes  .  Smokeless tobacco: Never Used  . Alcohol Use: No    Review of Systems  Constitutional: Negative for fever and chills.  Eyes: Negative for visual disturbance.  Respiratory: Negative for shortness of breath.   Cardiovascular: Negative for chest pain.  Gastrointestinal: Negative for vomiting and abdominal pain.  Genitourinary: Negative for dysuria and flank pain.  Musculoskeletal: Negative for back pain, neck pain and neck stiffness.  Skin: Positive for rash.  Neurological: Positive for light-headedness. Negative for headaches.    Allergies  Review of patient's allergies indicates no known allergies.  Home Medications   Current Outpatient Rx  Name  Route  Sig  Dispense  Refill  . lisinopril (PRINIVIL,ZESTRIL) 10 MG tablet   Oral   Take 1 tablet (10 mg total) by mouth daily.   30 tablet   0    BP 138/72  Pulse 91  Temp(Src) 98.3 F (36.8 C) (Oral)  Resp 18  SpO2 100% Physical Exam  Nursing note and vitals reviewed. Constitutional: He is oriented to person, place, and time. He appears well-developed and well-nourished.  HENT:  Head: Normocephalic and atraumatic.  Eyes: Conjunctivae are normal. Right eye exhibits no discharge. Left eye exhibits no discharge.  Neck: Normal range of motion. Neck supple. No tracheal deviation present.  Cardiovascular: Normal rate and regular rhythm.   Pulmonary/Chest: Effort normal and breath sounds normal.  Abdominal: Soft. He  exhibits no distension. There is no tenderness. There is no guarding.  Musculoskeletal: He exhibits edema and tenderness.  Neurological: He is alert and oriented to person, place, and time.  Skin: Skin is warm. Rash noted.  Mild swelling distal feet bilateral and toes, previous amputations.  Large ulcer with discharge from great toe and plantar surface bilateral.  Mild warmth and erythema distal.    Psychiatric: He has a normal mood and affect.    ED Course  Procedures (including critical care time) Labs  Review Labs Reviewed  CBC WITH DIFFERENTIAL - Abnormal; Notable for the following:    WBC 11.5 (*)    RBC 3.57 (*)    Hemoglobin 11.2 (*)    HCT 31.8 (*)    Neutro Abs 8.2 (*)    All other components within normal limits  COMPREHENSIVE METABOLIC PANEL - Abnormal; Notable for the following:    Glucose, Bld 166 (*)    BUN 27 (*)    Creatinine, Ser 2.11 (*)    Albumin 3.0 (*)    GFR calc non Af Amer 39 (*)    GFR calc Af Amer 45 (*)    All other components within normal limits  GLUCOSE, CAPILLARY - Abnormal; Notable for the following:    Glucose-Capillary 180 (*)    All other components within normal limits  CULTURE, BLOOD (ROUTINE X 2)  CULTURE, BLOOD (ROUTINE X 2)   Imaging Review Dg Foot Complete Left  03/22/2013   CLINICAL DATA:  Foot infection. Wound posterior great toe.  EXAM: LEFT FOOT - COMPLETE 3+ VIEW  COMPARISON:  01/28/2013  FINDINGS: Interphalangeal amputation of the great toe. Complete digital amputations of the 2nd and 3rd digits.  Ulceration of the great toe stump. High-density material on the stump is likely appointment superficially. No osseous erosion. No new joint narrowing.  The remainder of the foot shows no acute findings, such as fracture or evidence of osseous infection. Degenerative changes most notable at the 1st MTP joint and along the dorsal talar neck.  IMPRESSION: Ulcer on the great toe stump. No evidence of osteomyelitis.   Electronically Signed   By: Tiburcio Pea [name]   On: 03/22/2013 22:34   Dg Foot Complete Right  03/22/2013   CLINICAL DATA:  Osteomyelitis with infection. Diabetes.  EXAM: RIGHT FOOT COMPLETE - 3+ VIEW  COMPARISON:  03/08/2013  FINDINGS: Soft tissue wound re- demonstrated with radiodense material in the web space, possibly overlying packing material. Vascular calcification. Cellulitic change but no definite erosive change to suggest osteomyelitis. If no contraindications, MRI is more sensitive.  IMPRESSION: Cellulitis without definite  signs of osteomyelitis. Vascular calcification. Similar appearance to priors.   Electronically Signed   By: Davonna Belling [name]   On: 03/22/2013 22:39    EKG Interpretation   None       MDM  No diagnosis found. Concern for cellulitis and osteo, with upcoming surgery and medical hx Cultures and broad IV abx with pharmacy assistance Fluids given, paged ortho and hospitalist.   Spoke with ortho and Dr Julian Reil, admitted for IV abx.  Cellulitis, Osteomyelitis, ARF  Enid Skeens, MD 03/23/13 [phone]

## 2013-03-22 NOTE — ED Notes (Signed)
Pt presents to department for evaluation of bilateral foot pain and swelling. States wound infection to both feet, he is scheduled to have great toe amputations bilaterally on Friday. States pain and swelling became worse today, was told by PCP to come to ED for possible admission. Pt is alert and oriented x4.

## 2013-03-22 NOTE — H&P (Signed)
[facility] History and Physical  [name] ZOX:[id number] DOB: 1975/10/19 DOA: 03/22/2013  Referring physician: ED PCP: Default, Provider, MD  Chief Complaint: Foot pain  HPI: [name] is a 37 y.o. male with h/o sepsis, osteomyelitis and toe amputation, who presents to the ED with known B great toe ulcers that had been planned to be amputated by Dr. Lajoyce Corners on Friday of this week.  Unfortunately over the past 2 days at home patient developed fevers, warmth, and pus draining from his wounds on both of his feet.  He went to his PCP who promptly sent him to the ED for IV abx and admission.  Review of Systems: 12 systems reviewed and otherwise negative.  Past Medical History  Diagnosis Date  . Hypertension   . Diabetes mellitus     type 2 iddm x 18 yrs  . Vascular disease     poor circulation to left foot  . Acute osteomyelitis, ankle and foot 07/30/2010    Qualifier: Diagnosis of  By: Daiva Eves MD, Remi Haggard     Past Surgical History  Procedure Laterality Date  . Cholecystectomy    . Toe amputation  2012    left foot; great toe and second toe  . Amputation  04/15/2011    Procedure: AMPUTATION RAY;  Surgeon: Nadara Mustard, MD;  Location: [facility] OR;  Service: Orthopedics;  Laterality: Left;  left foot third toe amputation and MPP joint and gastroc resection VS. achilles lengthing   . Amputation  04/25/2011    Procedure: AMPUTATION DIGIT;  Surgeon: Nadara Mustard, MD;  Location: [facility] OR;  Service: Orthopedics;  Laterality: Left;  Left foot 3rd toe amputation MTP joint, Gastroc Recession  Achilles Lengthening    Social History:  reports that he has been smoking Cigarettes.  He has a .25 pack-year smoking history. He has never used smokeless tobacco. He reports that he uses illicit drugs (Marijuana) about 10 times per week. He reports that he does not drink alcohol.   No Known Allergies  History reviewed. No pertinent family history.  Prior to Admission medications   Medication  Sig Start Date End Date Taking? Authorizing Provider  lisinopril (PRINIVIL,ZESTRIL) 10 MG tablet Take 1 tablet (10 mg total) by mouth daily. 02/18/13  Yes Sharin Grave, MD   Physical Exam: Filed Vitals:   03/22/13 2145  BP: 120/63  Pulse: 87  Temp:   Resp:     General:  NAD, resting comfortably in bed Eyes: PEERLA EOMI ENT: mucous membranes moist Neck: supple w/o JVD Cardiovascular: RRR w/o MRG Respiratory: CTA B Abdomen: soft, nt, nd, bs+ Skin: large ulcers with discharge on B great toes on plantar surfaces, erythema surrounding Musculoskeletal: MAE, full ROM all 4 extremities Psychiatric: normal tone and affect Neurologic: AAOx3, grossly non-focal  Labs on Admission:  Basic Metabolic Panel:  Recent Labs Lab 03/22/13 1848  NA 138  K 4.1  CL 101  CO2 26  GLUCOSE 166*  BUN 27*  CREATININE 2.11*  CALCIUM 8.8   Liver Function Tests:  Recent Labs Lab 03/22/13 1848  AST 20  ALT 27  ALKPHOS 74  BILITOT 0.3  PROT 8.1  ALBUMIN 3.0*   No results found for this basename: LIPASE, AMYLASE,  in the last 168 hours No results found for this basename: AMMONIA,  in the last 168 hours CBC:  Recent Labs Lab 03/22/13 1848  WBC 11.5*  NEUTROABS 8.2*  HGB 11.2*  HCT 31.8*  MCV 89.1  PLT 191   Cardiac  Enzymes: No results found for this basename: CKTOTAL, CKMB, CKMBINDEX, TROPONINI,  in the last 168 hours  BNP (last 3 results) No results found for this basename: PROBNP,  in the last 8760 hours CBG:  Recent Labs Lab 03/22/13 2053  GLUCAP 180*    Radiological Exams on Admission: Dg Foot Complete Left  03/22/2013   CLINICAL DATA:  Foot infection. Wound posterior great toe.  EXAM: LEFT FOOT - COMPLETE 3+ VIEW  COMPARISON:  01/28/2013  FINDINGS: Interphalangeal amputation of the great toe. Complete digital amputations of the 2nd and 3rd digits.  Ulceration of the great toe stump. High-density material on the stump is likely appointment superficially. No osseous  erosion. No new joint narrowing.  The remainder of the foot shows no acute findings, such as fracture or evidence of osseous infection. Degenerative changes most notable at the 1st MTP joint and along the dorsal talar neck.  IMPRESSION: Ulcer on the great toe stump. No evidence of osteomyelitis.   Electronically Signed   By: Tiburcio Pea [name]   On: 03/22/2013 22:34   Dg Foot Complete Right  03/22/2013   CLINICAL DATA:  Osteomyelitis with infection. Diabetes.  EXAM: RIGHT FOOT COMPLETE - 3+ VIEW  COMPARISON:  03/08/2013  FINDINGS: Soft tissue wound re- demonstrated with radiodense material in the web space, possibly overlying packing material. Vascular calcification. Cellulitic change but no definite erosive change to suggest osteomyelitis. If no contraindications, MRI is more sensitive.  IMPRESSION: Cellulitis without definite signs of osteomyelitis. Vascular calcification. Similar appearance to priors.   Electronically Signed   By: Davonna Belling [name]   On: 03/22/2013 22:39    EKG: Independently reviewed.  Assessment/Plan Principal Problem:   Diabetic foot ulcers Active Problems:   Diabetes mellitus   AKI (acute kidney injury)   1. Diabetic foot ulcers of both great toes - with systemic symptoms of infection, initiated treatment in ED with broad spectrum zosyn and vanc, continuing this, ortho called but was not [facility] on call tonight so they need to be called in AM.  Likely needs amputation that was scheduled for Friday moved up.  Keeping patient npo after midnight, on SSI for DM2. 2. AKI - creatinine of 2.1 holding lisinopril and gentle hydration with NS.    Code Status: Full Code (must indicate code status--if unknown or must be presumed, indicate so) Family Communication: No family in room (indicate person spoken with, if applicable, with phone number if by telephone) Disposition Plan: Admit to inpatient (indicate anticipated LOS)  Time spent: 70 min  Jamarea Selner M. [facility] Pager [phone]  If 7PM-7AM, please contact night-coverage [email or web address] Password [email or web address] 03/22/2013, 10:58 PM

## 2013-03-22 NOTE — ED Notes (Signed)
Patient transported to X-ray 

## 2013-03-23 ENCOUNTER — Encounter (HOSPITAL_COMMUNITY): Payer: Self-pay | Admitting: *Deleted

## 2013-03-23 ENCOUNTER — Inpatient Hospital Stay (HOSPITAL_COMMUNITY): Admission: RE | Admit: 2013-03-23 | Payer: Medicaid Other | Source: Ambulatory Visit

## 2013-03-23 DIAGNOSIS — E1169 Type 2 diabetes mellitus with other specified complication: Secondary | ICD-10-CM | POA: Diagnosis present

## 2013-03-23 DIAGNOSIS — D649 Anemia, unspecified: Secondary | ICD-10-CM

## 2013-03-23 DIAGNOSIS — E1159 Type 2 diabetes mellitus with other circulatory complications: Secondary | ICD-10-CM

## 2013-03-23 DIAGNOSIS — M869 Osteomyelitis, unspecified: Secondary | ICD-10-CM | POA: Diagnosis present

## 2013-03-23 DIAGNOSIS — N189 Chronic kidney disease, unspecified: Secondary | ICD-10-CM

## 2013-03-23 DIAGNOSIS — M9080 Osteopathy in diseases classified elsewhere, unspecified site: Secondary | ICD-10-CM

## 2013-03-23 DIAGNOSIS — I10 Essential (primary) hypertension: Secondary | ICD-10-CM | POA: Diagnosis present

## 2013-03-23 DIAGNOSIS — I798 Other disorders of arteries, arterioles and capillaries in diseases classified elsewhere: Secondary | ICD-10-CM

## 2013-03-23 DIAGNOSIS — E1151 Type 2 diabetes mellitus with diabetic peripheral angiopathy without gangrene: Secondary | ICD-10-CM | POA: Diagnosis present

## 2013-03-23 LAB — BASIC METABOLIC PANEL
Calcium: 8.1 mg/dL — ABNORMAL LOW (ref 8.4–10.5)
GFR calc Af Amer: 54 mL/min — ABNORMAL LOW (ref >90)
GFR calc non Af Amer: 46 mL/min — ABNORMAL LOW (ref >90)
Glucose, Bld: 119 mg/dL — ABNORMAL HIGH (ref 70–99)
Sodium: 140 mEq/L (ref 135–145)

## 2013-03-23 LAB — GLUCOSE, CAPILLARY
Glucose-Capillary: 153 mg/dL — ABNORMAL HIGH (ref 70–99)
Glucose-Capillary: 135 mg/dL — ABNORMAL HIGH (ref 70–99)
Glucose-Capillary: 85 mg/dL (ref 70–99)

## 2013-03-23 LAB — CBC
HCT: 26.8 % — ABNORMAL LOW (ref 39.0–52.0)
MCH: 30.3 pg (ref 26.0–34.0)
MCHC: 34 g/dL (ref 30.0–36.0)
Platelets: 155 10*3/uL (ref 150–400)
RBC: 3 MIL/uL — ABNORMAL LOW (ref 4.22–5.81)
RDW: 13.3 % (ref 11.5–15.5)

## 2013-03-23 LAB — HEMOGLOBIN A1C
Hgb A1c MFr Bld: 6.9 % — ABNORMAL HIGH (ref <5.7)
Mean Plasma Glucose: 151 mg/dL — ABNORMAL HIGH (ref <117)

## 2013-03-23 MED ORDER — INSULIN ASPART 100 UNIT/ML ~~LOC~~ SOLN
0.0000 [IU] | Freq: Three times a day (TID) | SUBCUTANEOUS | Status: DC
  Administered 2013-03-26: 1 [IU] via SUBCUTANEOUS
  Administered 2013-03-26: 2 [IU] via SUBCUTANEOUS
  Administered 2013-03-26: 1 [IU] via SUBCUTANEOUS
  Administered 2013-03-27: 13:00:00 via SUBCUTANEOUS
  Administered 2013-03-28: 3 [IU] via SUBCUTANEOUS
  Administered 2013-03-28: 1 [IU] via SUBCUTANEOUS

## 2013-03-23 MED ORDER — ACETAMINOPHEN 325 MG PO TABS
650.0000 mg | ORAL_TABLET | ORAL | Status: DC | PRN
  Administered 2013-03-23 – 2013-03-27 (×2): 650 mg via ORAL
  Filled 2013-03-23: qty 2

## 2013-03-23 MED ORDER — INSULIN ASPART 100 UNIT/ML ~~LOC~~ SOLN
0.0000 [IU] | Freq: Three times a day (TID) | SUBCUTANEOUS | Status: DC
  Administered 2013-03-23: 2 [IU] via SUBCUTANEOUS

## 2013-03-23 MED ORDER — INSULIN ASPART 100 UNIT/ML ~~LOC~~ SOLN: 0.0000 [IU] | Freq: Every day | SUBCUTANEOUS | Status: DC

## 2013-03-23 MED ORDER — INFLUENZA VAC SPLIT QUAD 0.5 ML IM SUSP
0.5000 mL | INTRAMUSCULAR | Status: AC
  Administered 2013-03-24: 0.5 mL via INTRAMUSCULAR
  Filled 2013-03-23: qty 0.5

## 2013-03-23 MED ORDER — MORPHINE SULFATE 2 MG/ML IJ SOLN
2.0000 mg | INTRAMUSCULAR | Status: DC | PRN
  Administered 2013-03-23 – 2013-03-26 (×10): 2 mg via INTRAVENOUS
  Filled 2013-03-23 (×10): qty 1

## 2013-03-23 MED ORDER — PNEUMOCOCCAL VAC POLYVALENT 25 MCG/0.5ML IJ INJ
0.5000 mL | INJECTION | INTRAMUSCULAR | Status: AC
  Administered 2013-03-24: 0.5 mL via INTRAMUSCULAR
  Filled 2013-03-23: qty 0.5

## 2013-03-23 NOTE — Consult Note (Signed)
Dr. Lajoyce Corners has seen this patient and is planning amputation on Friday.  [facility] will not consult for this reason.  Kingjames Coury Coeburn RN,CWOCN [phone]

## 2013-03-23 NOTE — Consult Note (Signed)
Reason for Consult: Osteomyelitis bilateral great toe Referring Physician: Dr. Arkin  [name] is an 37 y.o. male.  HPI: Patient is a 37-year-old gentleman well known to me with bilateral great toe osteomyelitis. Patient has had acute increase in purulent drainage from the right great toe.  Past Medical History  Diagnosis Date  . Hypertension   . Diabetes mellitus     type 2 iddm x 18 yrs  . Vascular disease     poor circulation to left foot  . Acute osteomyelitis, ankle and foot 07/30/2010    Qualifier: Diagnosis of  By: Van Dam MD, Cornelius      Past Surgical History  Procedure Laterality Date  . Cholecystectomy    . Toe amputation  2012    left foot; great toe and second toe  . Amputation  04/15/2011    Procedure: AMPUTATION RAY;  Surgeon: Marcus V Duda, MD;  Location: [facility] OR;  Service: Orthopedics;  Laterality: Left;  left foot third toe amputation and MPP joint and gastroc resection VS. achilles lengthing   . Amputation  04/25/2011    Procedure: AMPUTATION DIGIT;  Surgeon: Marcus V Duda, MD;  Location: [facility] OR;  Service: Orthopedics;  Laterality: Left;  Left foot 3rd toe amputation MTP joint, Gastroc Recession  Achilles Lengthening     History reviewed. No pertinent family history.  Social History:  reports that he has been smoking Cigarettes.  He has a .25 pack-year smoking history. He has never used smokeless tobacco. He reports that he uses illicit drugs (Marijuana) about 10 times per week. He reports that he does not drink alcohol.  Allergies: No Known Allergies  Medications: I have reviewed the patient's current medications.  Results for orders placed during the hospital encounter of 03/22/13 (from the past 48 hour(s))  CBC WITH DIFFERENTIAL     Status: Abnormal   Collection Time    03/22/13  6:48 PM      Result Value Range   WBC 11.5 (*) 4.0 - 10.5 K/uL   RBC 3.57 (*) 4.22 - 5.81 MIL/uL   Hemoglobin 11.2 (*) 13.0 - 17.0 g/dL   HCT 31.8 (*) 39.0 - 52.0 %   MCV  89.1  78.0 - 100.0 fL   MCH 31.4  26.0 - 34.0 pg   MCHC 35.2  30.0 - 36.0 g/dL   RDW 13.2  11.5 - 15.5 %   Platelets 191  150 - 400 K/uL   Neutrophils Relative % 71  43 - 77 %   Neutro Abs 8.2 (*) 1.7 - 7.7 K/uL   Lymphocytes Relative 18  12 - 46 %   Lymphs Abs 2.1  0.7 - 4.0 K/uL   Monocytes Relative 9  3 - 12 %   Monocytes Absolute 1.0  0.1 - 1.0 K/uL   Eosinophils Relative 2  0 - 5 %   Eosinophils Absolute 0.3  0.0 - 0.7 K/uL   Basophils Relative 0  0 - 1 %   Basophils Absolute 0.0  0.0 - 0.1 K/uL  COMPREHENSIVE METABOLIC PANEL     Status: Abnormal   Collection Time    03/22/13  6:48 PM      Result Value Range   Sodium 138  135 - 145 mEq/L   Potassium 4.1  3.5 - 5.1 mEq/L   Chloride 101  96 - 112 mEq/L   CO2 26  19 - 32 mEq/L   Glucose, Bld 166 (*) 70 - 99 mg/dL   BUN 27 (*)   6 - 23 mg/dL   Creatinine, Ser 2.11 (*) 0.50 - 1.35 mg/dL   Calcium 8.8  8.4 - 10.5 mg/dL   Total Protein 8.1  6.0 - 8.3 g/dL   Albumin 3.0 (*) 3.5 - 5.2 g/dL   AST 20  0 - 37 U/L   ALT 27  0 - 53 U/L   Alkaline Phosphatase 74  39 - 117 U/L   Total Bilirubin 0.3  0.3 - 1.2 mg/dL   GFR calc non Af Amer 39 (*) >90 mL/min   GFR calc Af Amer 45 (*) >90 mL/min   Comment: (NOTE)     The eGFR has been calculated using the CKD EPI equation.     This calculation has not been validated in all clinical situations.     eGFR's persistently <90 mL/min signify possible Chronic Kidney     Disease.  GLUCOSE, CAPILLARY     Status: Abnormal   Collection Time    03/22/13  8:53 PM      Result Value Range   Glucose-Capillary 180 (*) 70 - 99 mg/dL  GLUCOSE, CAPILLARY     Status: Abnormal   Collection Time    03/23/13  1:08 AM      Result Value Range   Glucose-Capillary 153 (*) 70 - 99 mg/dL  GLUCOSE, CAPILLARY     Status: Abnormal   Collection Time    03/23/13  5:40 AM      Result Value Range   Glucose-Capillary 124 (*) 70 - 99 mg/dL    Dg Foot Complete Left  03/22/2013   CLINICAL DATA:  Foot infection.  Wound posterior great toe.  EXAM: LEFT FOOT - COMPLETE 3+ VIEW  COMPARISON:  01/28/2013  FINDINGS: Interphalangeal amputation of the great toe. Complete digital amputations of the 2nd and 3rd digits.  Ulceration of the great toe stump. High-density material on the stump is likely appointment superficially. No osseous erosion. No new joint narrowing.  The remainder of the foot shows no acute findings, such as fracture or evidence of osseous infection. Degenerative changes most notable at the 1st MTP joint and along the dorsal talar neck.  IMPRESSION: Ulcer on the great toe stump. No evidence of osteomyelitis.   Electronically Signed   By: Jonathan  Watts [name]   On: 03/22/2013 22:34   Dg Foot Complete Right  03/22/2013   CLINICAL DATA:  Osteomyelitis with infection. Diabetes.  EXAM: RIGHT FOOT COMPLETE - 3+ VIEW  COMPARISON:  03/08/2013  FINDINGS: Soft tissue wound re- demonstrated with radiodense material in the web space, possibly overlying packing material. Vascular calcification. Cellulitic change but no definite erosive change to suggest osteomyelitis. If no contraindications, MRI is more sensitive.  IMPRESSION: Cellulitis without definite signs of osteomyelitis. Vascular calcification. Similar appearance to priors.   Electronically Signed   By: John  Curnes [name]   On: 03/22/2013 22:39    Review of Systems  All other systems reviewed and are negative.   Blood pressure 116/74, pulse 83, temperature 98.7 F (37.1 C), temperature source Oral, resp. rate 16, height 6' 1" (1.854 m), weight 107.502 kg (237 lb), SpO2 97.00%. Physical Exam On examination patient has a palpable pulse bilaterally. Patient has osteomyelitis of the first metatarsal head right foot with a purulent draining ulcer beneath the MTP joint. Examination of the left foot he has an ulcer beneath the proximal phalanx of the left great toe. Assessment/Plan: Assessment: Osteomyelitis abscess right great toe first metatarsal head #2  osteomyelitis ulceration left great toe. Plan   will plan for surgery on Friday for right foot first ray amputation and left great toe amputation. Risks and benefits were discussed patient states he understands and wished to proceed at this time. We'll keep the patient on IV antibiotics for 48 hours before surgery to improve chances of  the incisions healing.  DUDA,MARCUS V 03/23/2013, 6:53 AM      

## 2013-03-23 NOTE — Progress Notes (Signed)
[facility] PROGRESS NOTE  [name] RUE:[id number] DOB: 1975-06-26 DOA: 03/22/2013 PCP: Dorrene German, MD  HPI/Brief narrative 37 year old male patient with history of DM 2/IDDM complicated by PAD, possible peripheral neuropathy, HTN, prior history of osteomyelitis & left toe amputations presented to the ED on 03/22/13 due to fevers, draining wounds from both feet. He was evaluated by Dr. Lajoyce Corners who plans surgery on 03/25/13.  Assessment/Plan:  Osteomyelitis & abscess right great toe and osteomyelitis with ulceration of left great toe - Orthopedic consultation appreciated. - Continue empiric IV Zosyn and vancomycin. - Plan for right first ray amputation and left great toe amputation on 03/25/13.  Type II DM with possible peripheral neuropathy and PAD - Patient states that he has been off all antidiabetic medications for approximately a year. He states that he checks CBGs twice daily at home with highest reading being 120 mg per DL. - Continue SSI.  Acute on possible stage II chronic kidney disease from diabetic nephropathy - Acute renal failure precipitated by ACE inhibitors. - Hold ACE inhibitors. Continue gentle hydration and follow BMP. - Improving.  Anemia - Probably from chronic disease and chronic kidney disease - No active bleeding. - Follow CBC in a.m.  Hypertension  - Controlled    DVT prophylaxis: Heparin  Lines/catheters: PIV  Nutrition: Diabetic diet   Activity:  Ad lib.  Code Status: Full  Family Communication: None  Disposition Plan: Home when medically stable    Consultants:  Orthopedics   Procedures:  None   Antibiotics:  IV Zosyn 10/14 >  IV vancomycin 10/14 >   Subjective: Mild pain bilateral feet/toes.   Objective: Filed Vitals:   03/23/13 0000 03/23/13 0102 03/23/13 0549 03/23/13 1000  BP: 112/65 122/80 116/74 120/72  Pulse: 86 86 83 83  Temp: 98.6 F (37 C) 98.6 F (37 C) 98.7 F (37.1 C) 98.2 F (36.8 C)  TempSrc:  Oral Oral Oral Oral  Resp: 15 15 16 18   Height:  6\' 1"  (1.854 m)    Weight:  107.502 kg (237 lb)    SpO2: 97% 99% 97% 98%    Intake/Output Summary (Last 24 hours) at 03/23/13 1414 Last data filed at 03/23/13 0900  Gross per 24 hour  Intake    240 ml  Output    200 ml  Net     40 ml   Filed Weights   03/23/13 0102  Weight: 107.502 kg (237 lb)     Exam:  General exam: Moderately built and overweight male patient lying comfortably supine in bed  Respiratory system: Clear. No increased work of breathing. Cardiovascular system: S1 & S2 heard, RRR. No JVD, murmurs, gallops, clicks or pedal edema. Gastrointestinal system: Abdomen is nondistended, soft and nontender. Normal bowel sounds heard. Central nervous system: Alert and oriented. No focal neurological deficits. Extremities: Symmetric 5 x 5 power.Purulent draining ulcer beneath the right great toe stump/MTP joint. Small ulcer beneath left great toe proximal phalanx. Amputated left second and third toes.    Data Reviewed: Basic Metabolic Panel:  Recent Labs Lab 03/22/13 1848 03/23/13 0539  NA 138 140  K 4.1 4.2  CL 101 106  CO2 26 27  GLUCOSE 166* 119*  BUN 27* 25*  CREATININE 2.11* 1.82*  CALCIUM 8.8 8.1*   Liver Function Tests:  Recent Labs Lab 03/22/13 1848  AST 20  ALT 27  ALKPHOS 74  BILITOT 0.3  PROT 8.1  ALBUMIN 3.0*   No results found for this basename: LIPASE, AMYLASE,  in  the last 168 hours No results found for this basename: AMMONIA,  in the last 168 hours CBC:  Recent Labs Lab 03/22/13 1848 03/23/13 0539  WBC 11.5* 8.1  NEUTROABS 8.2*  --   HGB 11.2* 9.1*  HCT 31.8* 26.8*  MCV 89.1 89.3  PLT 191 155   Cardiac Enzymes: No results found for this basename: CKTOTAL, CKMB, CKMBINDEX, TROPONINI,  in the last 168 hours BNP (last 3 results) No results found for this basename: PROBNP,  in the last 8760 hours CBG:  Recent Labs Lab 03/22/13 2053 03/23/13 0108 03/23/13 0540  03/23/13 0802 03/23/13 1208  GLUCAP 180* 153* 124* 106* 135*    No results found for this or any previous visit (from the past 240 hour(s)).    Additional labs: 1. None     Studies: Dg Foot Complete Left  03/22/2013   CLINICAL DATA:  Foot infection. Wound posterior great toe.  EXAM: LEFT FOOT - COMPLETE 3+ VIEW  COMPARISON:  01/28/2013  FINDINGS: Interphalangeal amputation of the great toe. Complete digital amputations of the 2nd and 3rd digits.  Ulceration of the great toe stump. High-density material on the stump is likely appointment superficially. No osseous erosion. No new joint narrowing.  The remainder of the foot shows no acute findings, such as fracture or evidence of osseous infection. Degenerative changes most notable at the 1st MTP joint and along the dorsal talar neck.  IMPRESSION: Ulcer on the great toe stump. No evidence of osteomyelitis.   Electronically Signed   By: Tiburcio Pea [name]   On: 03/22/2013 22:34   Dg Foot Complete Right  03/22/2013   CLINICAL DATA:  Osteomyelitis with infection. Diabetes.  EXAM: RIGHT FOOT COMPLETE - 3+ VIEW  COMPARISON:  03/08/2013  FINDINGS: Soft tissue wound re- demonstrated with radiodense material in the web space, possibly overlying packing material. Vascular calcification. Cellulitic change but no definite erosive change to suggest osteomyelitis. If no contraindications, MRI is more sensitive.  IMPRESSION: Cellulitis without definite signs of osteomyelitis. Vascular calcification. Similar appearance to priors.   Electronically Signed   By: Davonna Belling [name]   On: 03/22/2013 22:39        Scheduled Meds: . heparin  5,000 Units Subcutaneous Q8H  . [START ON 03/24/2013] influenza vac split quadrivalent PF  0.5 mL Intramuscular Tomorrow-1000  . insulin aspart  0-15 Units Subcutaneous TID WC & HS  . piperacillin-tazobactam  3.375 g Intravenous Once  . piperacillin-tazobactam (ZOSYN)  IV  3.375 g Intravenous Q8H  . [START ON  03/24/2013] pneumococcal 23 valent vaccine  0.5 mL Intramuscular Tomorrow-1000  . sodium chloride  500 mL Intravenous Once  . vancomycin  1,500 mg Intravenous Q24H   Continuous Infusions: . sodium chloride 100 mL/hr at 03/23/13 0224    Principal Problem:   Diabetic foot ulcers Active Problems:   Diabetes mellitus   AKI (acute kidney injury)    Time spent: 35 minutes.    Marcellus Scott, MD, FACP, FHM. [facility] Pager [phone]  If 7PM-7AM, please contact night-coverage [email or web address] Password [email or web address] 03/23/2013, 2:14 PM    LOS: 1 day

## 2013-03-24 LAB — GLUCOSE, CAPILLARY
Glucose-Capillary: 99 mg/dL (ref 70–99)
Glucose-Capillary: 100 mg/dL — ABNORMAL HIGH (ref 70–99)
Glucose-Capillary: 101 mg/dL — ABNORMAL HIGH (ref 70–99)

## 2013-03-24 LAB — BASIC METABOLIC PANEL
CO2: 27 mEq/L (ref 19–32)
Chloride: 103 mEq/L (ref 96–112)
Creatinine, Ser: 1.58 mg/dL — ABNORMAL HIGH (ref 0.50–1.35)
GFR calc Af Amer: 64 mL/min — ABNORMAL LOW (ref >90)
Glucose, Bld: 89 mg/dL (ref 70–99)
Potassium: 4.1 mEq/L (ref 3.5–5.1)
Sodium: 137 mEq/L (ref 135–145)

## 2013-03-24 LAB — CBC
HCT: 26.6 % — ABNORMAL LOW (ref 39.0–52.0)
Platelets: 172 10*3/uL (ref 150–400)
RDW: 13.4 % (ref 11.5–15.5)
WBC: 6.7 10*3/uL (ref 4.0–10.5)

## 2013-03-24 MED ORDER — CHLORHEXIDINE GLUCONATE 4 % EX LIQD
60.0000 mL | Freq: Once | CUTANEOUS | Status: AC
  Administered 2013-03-25: 4 via TOPICAL
  Filled 2013-03-24 (×2): qty 60

## 2013-03-24 MED ORDER — VANCOMYCIN HCL IN DEXTROSE 1-5 GM/200ML-% IV SOLN
1000.0000 mg | Freq: Two times a day (BID) | INTRAVENOUS | Status: DC
  Administered 2013-03-24 – 2013-03-28 (×9): 1000 mg via INTRAVENOUS
  Filled 2013-03-24 (×10): qty 200

## 2013-03-24 NOTE — Progress Notes (Signed)
[facility] PROGRESS NOTE  [name] ZOX:[id number] DOB: 1975-09-22 DOA: 03/22/2013 PCP: Dorrene German, MD  HPI/Brief narrative 37 year old male patient with history of DM 2/IDDM complicated by PAD, possible peripheral neuropathy, HTN, prior history of osteomyelitis & left toe amputations presented to the ED on 03/22/13 due to fevers, draining wounds from both feet. He was evaluated by Dr. Lajoyce Corners who plans surgery on 03/25/13.  Assessment/Plan:  Osteomyelitis & abscess right great toe and osteomyelitis with ulceration of left great toe - Orthopedic consultation appreciated. - Continue empiric IV Zosyn and vancomycin. - Plan for right first ray amputation and left great toe amputation on 03/25/13.  Type II DM with possible peripheral neuropathy and PAD - Patient states that he has been off all antidiabetic medications for approximately a year. He states that he checks CBGs twice daily at home with highest reading being 120 mg per DL. - Continue SSI.  Acute on possible stage II chronic kidney disease from diabetic nephropathy - Acute renal failure precipitated by ACE inhibitors. - Hold ACE inhibitors. Continue gentle hydration and follow BMP. - Improving. Baseline creatinine probably in the 1.3-1.4 range. - Creatinine 2.11 > 1.82 > 1.58  Anemia - Probably from chronic disease and chronic kidney disease - No active bleeding. - Stable. Some of the hemoglobin dropped may be from hemodilution. Follow CBC in a.m. and transfuse if hemoglobin less than 7 g per DL.  Hypertension  - Controlled    DVT prophylaxis: Heparin  Lines/catheters: PIV  Nutrition: Diabetic diet   Activity:  Ad lib.  Code Status: Full  Family Communication: None  Disposition Plan: Home when medically stable    Consultants:  Orthopedics   Procedures:  None   Antibiotics:  IV Zosyn 10/14 >  IV vancomycin 10/14 >   Subjective: Pain in both feet/toes is better. Ambulating in the room. Denies  any other complaints.  Objective: Filed Vitals:   03/23/13 2214 03/24/13 0541 03/24/13 0956 03/24/13 1400  BP: 124/77 126/76 137/85 128/78  Pulse: 71 69 75 78  Temp: 98.3 F (36.8 C) 98.3 F (36.8 C) 98.6 F (37 C) 98.2 F (36.8 C)  TempSrc: Oral Oral Oral Oral  Resp: 18 18 18 18   Height:      Weight:      SpO2: 99% 100% 100% 100%    Intake/Output Summary (Last 24 hours) at 03/24/13 1559 Last data filed at 03/24/13 1523  Gross per 24 hour  Intake 3418.33 ml  Output   1601 ml  Net 1817.33 ml   Filed Weights   03/23/13 0102  Weight: 107.502 kg (237 lb)     Exam:  General exam: Moderately built and overweight male patient lying comfortably supine in bed  Respiratory system: Clear. No increased work of breathing. Cardiovascular system: S1 & S2 heard, RRR. No JVD, murmurs, gallops, clicks or pedal edema. Gastrointestinal system: Abdomen is nondistended, soft and nontender. Normal bowel sounds heard. Central nervous system: Alert and oriented. No focal neurological deficits. Extremities: Symmetric 5 x 5 power.Purulent draining ulcer beneath the right great toe stump/MTP joint. Small ulcer beneath left great toe proximal phalanx. Amputated left second and third toes.    Data Reviewed: Basic Metabolic Panel:  Recent Labs Lab 03/22/13 1848 03/23/13 0539 03/24/13 0440  NA 138 140 137  K 4.1 4.2 4.1  CL 101 106 103  CO2 26 27 27   GLUCOSE 166* 119* 89  BUN 27* 25* 18  CREATININE 2.11* 1.82* 1.58*  CALCIUM 8.8 8.1* 8.4  Liver Function Tests:  Recent Labs Lab 03/22/13 1848  AST 20  ALT 27  ALKPHOS 74  BILITOT 0.3  PROT 8.1  ALBUMIN 3.0*   No results found for this basename: LIPASE, AMYLASE,  in the last 168 hours No results found for this basename: AMMONIA,  in the last 168 hours CBC:  Recent Labs Lab 03/22/13 1848 03/23/13 0539 03/24/13 0440  WBC 11.5* 8.1 6.7  NEUTROABS 8.2*  --   --   HGB 11.2* 9.1* 8.9*  HCT 31.8* 26.8* 26.6*  MCV 89.1 89.3  90.5  PLT 191 155 172   Cardiac Enzymes: No results found for this basename: CKTOTAL, CKMB, CKMBINDEX, TROPONINI,  in the last 168 hours BNP (last 3 results) No results found for this basename: PROBNP,  in the last 8760 hours CBG:  Recent Labs Lab 03/23/13 1208 03/23/13 1611 03/23/13 2210 03/24/13 0751 03/24/13 1204  GLUCAP 135* 85 99 100* 101*    Recent Results (from the past 240 hour(s))  CULTURE, BLOOD (ROUTINE X 2)     Status: None   Collection Time    03/22/13 10:39 PM      Result Value Range Status   Specimen Description BLOOD RIGHT ARM   Final   Special Requests BOTTLES DRAWN AEROBIC AND ANAEROBIC   Final   Culture  Setup Time     Final   Value: 03/23/2013 04:37     Performed at [facility]   Culture     Final   Value:        BLOOD CULTURE RECEIVED NO GROWTH TO DATE CULTURE WILL BE HELD FOR 5 DAYS BEFORE ISSUING A FINAL NEGATIVE REPORT     Performed at [facility]   Report Status PENDING   Incomplete  CULTURE, BLOOD (ROUTINE X 2)     Status: None   Collection Time    03/22/13 10:40 PM      Result Value Range Status   Specimen Description BLOOD LEFT ARM   Final   Special Requests BOTTLES DRAWN AEROBIC AND ANAEROBIC 10CC EACH   Final   Culture  Setup Time     Final   Value: 03/23/2013 04:37     Performed at [facility]   Culture     Final   Value:        BLOOD CULTURE RECEIVED NO GROWTH TO DATE CULTURE WILL BE HELD FOR 5 DAYS BEFORE ISSUING A FINAL NEGATIVE REPORT     Performed at [facility]   Report Status PENDING   Incomplete      Additional labs: 1. None     Studies: Dg Foot Complete Left  03/22/2013   CLINICAL DATA:  Foot infection. Wound posterior great toe.  EXAM: LEFT FOOT - COMPLETE 3+ VIEW  COMPARISON:  01/28/2013  FINDINGS: Interphalangeal amputation of the great toe. Complete digital amputations of the 2nd and 3rd digits.  Ulceration of the great toe stump. High-density material on the stump is likely  appointment superficially. No osseous erosion. No new joint narrowing.  The remainder of the foot shows no acute findings, such as fracture or evidence of osseous infection. Degenerative changes most notable at the 1st MTP joint and along the dorsal talar neck.  IMPRESSION: Ulcer on the great toe stump. No evidence of osteomyelitis.   Electronically Signed   By: Tiburcio Pea [name]   On: 03/22/2013 22:34   Dg Foot Complete Right  03/22/2013   CLINICAL DATA:  Osteomyelitis with  infection. Diabetes.  EXAM: RIGHT FOOT COMPLETE - 3+ VIEW  COMPARISON:  03/08/2013  FINDINGS: Soft tissue wound re- demonstrated with radiodense material in the web space, possibly overlying packing material. Vascular calcification. Cellulitic change but no definite erosive change to suggest osteomyelitis. If no contraindications, MRI is more sensitive.  IMPRESSION: Cellulitis without definite signs of osteomyelitis. Vascular calcification. Similar appearance to priors.   Electronically Signed   By: Davonna Belling [name]   On: 03/22/2013 22:39        Scheduled Meds: . heparin  5,000 Units Subcutaneous Q8H  . insulin aspart  0-5 Units Subcutaneous QHS  . insulin aspart  0-9 Units Subcutaneous TID WC  . piperacillin-tazobactam  3.375 g Intravenous Once  . piperacillin-tazobactam (ZOSYN)  IV  3.375 g Intravenous Q8H  . sodium chloride  500 mL Intravenous Once  . vancomycin  1,000 mg Intravenous BID   Continuous Infusions: . sodium chloride 100 mL/hr at 03/23/13 0224    Principal Problem:   Diabetic osteomyelitis b/l toes Active Problems:   Diabetic foot ulcers   AKI (acute kidney injury)   DM (diabetes mellitus) type II uncontrolled, periph vascular disorder   Renal failure, acute on chronic   Anemia   Essential hypertension, benign    Time spent: 25 minutes.    Marcellus Scott, MD, FACP, FHM. [facility] Pager [phone]  If 7PM-7AM, please contact night-coverage [email or web address] Password  [email or web address] 03/24/2013, 3:59 PM    LOS: 2 days

## 2013-03-24 NOTE — Progress Notes (Signed)
ANTIBIOTIC CONSULT NOTE - FOLLOW UP  Pharmacy Consult for Vancomycin and Zosyn Indication: foot ulcer/osteomyelitis  No Known Allergies  Patient Measurements: Height: 6\' 1"  (185.4 cm) Weight: 237 lb (107.502 kg) IBW/kg (Calculated) : 79.9  Vital Signs: Temp: 98.6 F (37 C) (10/16 0956) Temp src: Oral (10/16 0956) BP: 137/85 mmHg (10/16 0956) Pulse Rate: 75 (10/16 0956) Intake/Output from previous day: 10/15 0701 - 10/16 0700 In: 1830 [P.O.:840; I.V.:890; IV Piggyback:100] Out: 1200 [Urine:1200] Intake/Output from this shift: Total I/O In: 240 [P.O.:240] Out: 400 [Urine:400]  Labs:  Recent Labs  03/22/13 1848 03/23/13 0539 03/24/13 0440  WBC 11.5* 8.1 6.7  HGB 11.2* 9.1* 8.9*  PLT 191 155 172  CREATININE 2.11* 1.82* 1.58*   Estimated Creatinine Clearance: 83.1 ml/min (by C-G formula based on Cr of 1.58).    Microbiology: Recent Results (from the past 720 hour(s))  CULTURE, BLOOD (ROUTINE X 2)     Status: None   Collection Time    03/22/13 10:39 PM      Result Value Range Status   Specimen Description BLOOD RIGHT ARM   Final   Special Requests BOTTLES DRAWN AEROBIC AND ANAEROBIC   Final   Culture  Setup Time     Final   Value: 03/23/2013 04:37     Performed at [facility]   Culture     Final   Value:        BLOOD CULTURE RECEIVED NO GROWTH TO DATE CULTURE WILL BE HELD FOR 5 DAYS BEFORE ISSUING A FINAL NEGATIVE REPORT     Performed at [facility]   Report Status PENDING   Incomplete  CULTURE, BLOOD (ROUTINE X 2)     Status: None   Collection Time    03/22/13 10:40 PM      Result Value Range Status   Specimen Description BLOOD LEFT ARM   Final   Special Requests BOTTLES DRAWN AEROBIC AND ANAEROBIC 10CC EACH   Final   Culture  Setup Time     Final   Value: 03/23/2013 04:37     Performed at [facility]   Culture     Final   Value:        BLOOD CULTURE RECEIVED NO GROWTH TO DATE CULTURE WILL BE HELD FOR 5 DAYS BEFORE ISSUING  A FINAL NEGATIVE REPORT     Performed at [facility]   Report Status PENDING   Incomplete    Anti-infectives   Start     Dose/Rate Route Frequency Ordered Stop   03/23/13 2200  vancomycin (VANCOCIN) 1,500 mg in sodium chloride 0.9 % 500 mL IVPB     1,500 mg 250 mL/hr over 120 Minutes Intravenous Every 24 hours 03/22/13 2215     03/23/13 0500  piperacillin-tazobactam (ZOSYN) IVPB 3.375 g     3.375 g 12.5 mL/hr over 240 Minutes Intravenous Every 8 hours 03/22/13 2215     03/22/13 2230  piperacillin-tazobactam (ZOSYN) IVPB 3.375 g     3.375 g 100 mL/hr over 30 Minutes Intravenous  Once 03/22/13 2215     03/22/13 2230  vancomycin (VANCOCIN) 2,500 mg in sodium chloride 0.9 % 500 mL IVPB     2,500 mg 250 mL/hr over 120 Minutes Intravenous  Once 03/22/13 2215 03/23/13 0142   03/22/13 2145  vancomycin (VANCOCIN) 15 mg/kg in sodium chloride 0.9 % 100 mL IVPB  Status:  Discontinued     15 mg/kg 100 mL/hr over 60 Minutes Intravenous  Once 03/22/13  2142 03/22/13 2216      Assessment: 37 yo M with foot infection started on IV antibiotics 10/14 in preparation for amputation surgery scheduled for 10/17.  Pt noted to have ARF on admission with SCr 2.11.  SCr has continued to improve with hydration and holding of ACEi.  SCr today is 1.58 with CrCl ~ 80 ml./min.  Will adjust abx doses accordingly.   Goal of Therapy:  Vancomycin trough level 15-20 mcg/ml  Plan:  1) Change Vancomycin to 1gm IV q12h 2) Continue zosyn 3.375gm IV q8h (infuse over 4 hours) 3) check vancomycin level at steady state 4) Follow up length of therapy of post-op IV antibiotics  Cuinn Westerhold, Pharm.D., BCPS Clinical Pharmacist Pager [phone] 03/24/2013 11:49 AM

## 2013-03-24 NOTE — Care Management Note (Signed)
   CARE MANAGEMENT NOTE 03/24/2013  Patient:  [name], [name]   Account Number:  [id number]  Date Initiated:  03/24/2013  Documentation initiated by:  Anarie Kalish  Subjective/Objective Assessment:   Consult for [facility] needs, not orders yet.     Action/Plan:   Following for identifed HH needs and orders.   Anticipated DC Date:  03/28/2013   Anticipated DC Plan:  HOME W HOME HEALTH SERVICES         Choice offered to / List presented to:             Status of service:  In process, will continue to follow Medicare Important Message given?   (If response is "NO", the following Medicare IM given date fields will be blank) Date Medicare IM given:   Date Additional Medicare IM given:    Discharge Disposition:    Per UR Regulation:    If discussed at Long Length of Stay Meetings, dates discussed:    Comments:

## 2013-03-25 ENCOUNTER — Inpatient Hospital Stay (HOSPITAL_COMMUNITY): Payer: Medicaid Other | Admitting: Certified Registered"

## 2013-03-25 ENCOUNTER — Ambulatory Visit (HOSPITAL_COMMUNITY): Admission: RE | Admit: 2013-03-25 | Payer: Medicaid Other | Source: Ambulatory Visit | Admitting: Orthopedic Surgery

## 2013-03-25 ENCOUNTER — Encounter (HOSPITAL_COMMUNITY)
Admission: EM | Disposition: A | Discharge status: Home / Self Care | Payer: Self-pay | Source: Home / Self Care | Attending: Internal Medicine

## 2013-03-25 ENCOUNTER — Encounter (HOSPITAL_COMMUNITY): Payer: Self-pay | Admitting: Certified Registered"

## 2013-03-25 ENCOUNTER — Encounter (HOSPITAL_COMMUNITY): Payer: Medicaid Other | Admitting: Vascular Surgery

## 2013-03-25 DIAGNOSIS — I10 Essential (primary) hypertension: Secondary | ICD-10-CM

## 2013-03-25 HISTORY — PX: AMPUTATION: SHX166

## 2013-03-25 LAB — GLUCOSE, CAPILLARY
Glucose-Capillary: 103 mg/dL — ABNORMAL HIGH (ref 70–99)
Glucose-Capillary: 116 mg/dL — ABNORMAL HIGH (ref 70–99)
Glucose-Capillary: 90 mg/dL (ref 70–99)
Glucose-Capillary: 79 mg/dL (ref 70–99)

## 2013-03-25 LAB — BASIC METABOLIC PANEL
BUN: 17 mg/dL (ref 6–23)
CO2: 23 mEq/L (ref 19–32)
Calcium: 8.6 mg/dL (ref 8.4–10.5)
Creatinine, Ser: 1.62 mg/dL — ABNORMAL HIGH (ref 0.50–1.35)
GFR calc non Af Amer: 53 mL/min — ABNORMAL LOW (ref >90)
Glucose, Bld: 110 mg/dL — ABNORMAL HIGH (ref 70–99)

## 2013-03-25 LAB — CBC
HCT: 28.2 % — ABNORMAL LOW (ref 39.0–52.0)
Hemoglobin: 9.6 g/dL — ABNORMAL LOW (ref 13.0–17.0)
MCH: 29.8 pg (ref 26.0–34.0)
MCHC: 34 g/dL (ref 30.0–36.0)
MCV: 87.6 fL (ref 78.0–100.0)
RBC: 3.22 MIL/uL — ABNORMAL LOW (ref 4.22–5.81)

## 2013-03-25 SURGERY — AMPUTATION, FOOT, RAY
Anesthesia: General | Site: Foot | Laterality: Bilateral | Wound class: Dirty or Infected

## 2013-03-25 MED ORDER — 0.9 % SODIUM CHLORIDE (POUR BTL) OPTIME
TOPICAL | Status: DC | PRN
  Administered 2013-03-25: 200 mL

## 2013-03-25 MED ORDER — METOCLOPRAMIDE HCL 5 MG/ML IJ SOLN
5.0000 mg | Freq: Three times a day (TID) | INTRAMUSCULAR | Status: DC | PRN
  Administered 2013-03-27 (×2): 10 mg via INTRAVENOUS
  Filled 2013-03-25 (×3): qty 2

## 2013-03-25 MED ORDER — ONDANSETRON HCL 4 MG PO TABS: 4.0000 mg | ORAL_TABLET | Freq: Four times a day (QID) | ORAL | Status: DC | PRN

## 2013-03-25 MED ORDER — METOCLOPRAMIDE HCL 5 MG PO TABS
5.0000 mg | ORAL_TABLET | Freq: Three times a day (TID) | ORAL | Status: DC | PRN
  Filled 2013-03-25: qty 2

## 2013-03-25 MED ORDER — HYDROMORPHONE HCL PF 1 MG/ML IJ SOLN
1.0000 mg | INTRAMUSCULAR | Status: DC | PRN
  Administered 2013-03-26 – 2013-03-28 (×6): 1 mg via INTRAVENOUS
  Filled 2013-03-25 (×6): qty 1

## 2013-03-25 MED ORDER — SODIUM CHLORIDE 0.9 % IV SOLN: INTRAVENOUS | Status: DC

## 2013-03-25 MED ORDER — OXYCODONE HCL 5 MG/5ML PO SOLN: 5.0000 mg | Freq: Once | ORAL | Status: AC | PRN

## 2013-03-25 MED ORDER — MIDAZOLAM HCL 2 MG/2ML IJ SOLN: 1.0000 mg | INTRAMUSCULAR | Status: DC | PRN

## 2013-03-25 MED ORDER — OXYCODONE-ACETAMINOPHEN 5-325 MG PO TABS
1.0000 | ORAL_TABLET | ORAL | Status: DC | PRN
Start: 2013-03-25 — End: 2013-03-28
  Administered 2013-03-26 – 2013-03-28 (×2): 2 via ORAL
  Filled 2013-03-25 (×2): qty 2

## 2013-03-25 MED ORDER — FENTANYL CITRATE 0.05 MG/ML IJ SOLN: 50.0000 ug | Freq: Once | INTRAMUSCULAR | Status: DC

## 2013-03-25 MED ORDER — HYDROCODONE-ACETAMINOPHEN 5-325 MG PO TABS: 1.0000 | ORAL_TABLET | ORAL | Status: DC | PRN

## 2013-03-25 MED ORDER — OXYCODONE HCL 5 MG PO TABS
5.0000 mg | ORAL_TABLET | Freq: Once | ORAL | Status: AC | PRN
Start: 2013-03-25 — End: 2013-03-25

## 2013-03-25 MED ORDER — FENTANYL CITRATE 0.05 MG/ML IJ SOLN
INTRAMUSCULAR | Status: DC | PRN
  Administered 2013-03-25 (×2): 50 ug via INTRAVENOUS
  Administered 2013-03-25: 25 ug via INTRAVENOUS

## 2013-03-25 MED ORDER — MIDAZOLAM HCL 5 MG/5ML IJ SOLN
INTRAMUSCULAR | Status: DC | PRN
  Administered 2013-03-25: 2 mg via INTRAVENOUS

## 2013-03-25 MED ORDER — PROPOFOL INFUSION 10 MG/ML OPTIME
INTRAVENOUS | Status: DC | PRN
  Administered 2013-03-25: 100 ug/kg/min via INTRAVENOUS

## 2013-03-25 MED ORDER — ONDANSETRON HCL 4 MG/2ML IJ SOLN
4.0000 mg | Freq: Four times a day (QID) | INTRAMUSCULAR | Status: DC | PRN
  Administered 2013-03-25: 4 mg via INTRAVENOUS
  Filled 2013-03-25 (×2): qty 2

## 2013-03-25 MED ORDER — ONDANSETRON HCL 4 MG/2ML IJ SOLN
4.0000 mg | Freq: Four times a day (QID) | INTRAMUSCULAR | Status: DC | PRN
  Administered 2013-03-26 – 2013-03-28 (×4): 4 mg via INTRAVENOUS
  Filled 2013-03-25 (×3): qty 2

## 2013-03-25 MED ORDER — HYDROMORPHONE HCL PF 1 MG/ML IJ SOLN
0.2500 mg | INTRAMUSCULAR | Status: DC | PRN
  Administered 2013-03-26: 0.5 mg via INTRAVENOUS
  Filled 2013-03-25: qty 1

## 2013-03-25 MED ORDER — PROMETHAZINE HCL 25 MG/ML IJ SOLN: 6.2500 mg | INTRAMUSCULAR | Status: DC | PRN

## 2013-03-25 MED ORDER — ONDANSETRON HCL 4 MG/2ML IJ SOLN
INTRAMUSCULAR | Status: DC | PRN
  Administered 2013-03-25: 4 mg via INTRAMUSCULAR

## 2013-03-25 SURGICAL SUPPLY — 52 items
BANDAGE ESMARK 6X9 LF (GAUZE/BANDAGES/DRESSINGS) IMPLANT
BANDAGE GAUZE ELAST BULKY 4 IN (GAUZE/BANDAGES/DRESSINGS) ×4 IMPLANT
BLADE SAW SGTL MED 73X18.5 STR (BLADE) IMPLANT
BNDG CMPR 9X6 STRL LF SNTH (GAUZE/BANDAGES/DRESSINGS)
BNDG COHESIVE 4X5 TAN STRL (GAUZE/BANDAGES/DRESSINGS) ×3 IMPLANT
BNDG COHESIVE 6X5 TAN STRL LF (GAUZE/BANDAGES/DRESSINGS) ×1 IMPLANT
BNDG ESMARK 6X9 LF (GAUZE/BANDAGES/DRESSINGS)
CLOTH BEACON ORANGE TIMEOUT ST (SAFETY) ×1 IMPLANT
CUFF TOURNIQUET SINGLE 34IN LL (TOURNIQUET CUFF) IMPLANT
CUFF TOURNIQUET SINGLE 44IN (TOURNIQUET CUFF) IMPLANT
DRAPE EXTREMITY BILATERAL (DRAPE) ×1 IMPLANT
DRAPE U-SHAPE 47X51 STRL (DRAPES) ×4 IMPLANT
DRSG ADAPTIC 3X8 NADH LF (GAUZE/BANDAGES/DRESSINGS) ×2 IMPLANT
DRSG EMULSION OIL 3X3 NADH (GAUZE/BANDAGES/DRESSINGS) ×2 IMPLANT
DRSG PAD ABDOMINAL 8X10 ST (GAUZE/BANDAGES/DRESSINGS) ×2 IMPLANT
DURAPREP 26ML APPLICATOR (WOUND CARE) ×2 IMPLANT
ELECT REM PT RETURN 9FT ADLT (ELECTROSURGICAL) ×2
ELECTRODE REM PT RTRN 9FT ADLT (ELECTROSURGICAL) ×1 IMPLANT
GLOVE BIOGEL PI IND STRL 7.5 (GLOVE) IMPLANT
GLOVE BIOGEL PI IND STRL 8 (GLOVE) IMPLANT
GLOVE BIOGEL PI IND STRL 9 (GLOVE) ×1 IMPLANT
GLOVE BIOGEL PI INDICATOR 7.5 (GLOVE) ×2
GLOVE BIOGEL PI INDICATOR 8 (GLOVE) ×2
GLOVE BIOGEL PI INDICATOR 9 (GLOVE) ×1
GLOVE ECLIPSE 7.0 STRL STRAW (GLOVE) ×1 IMPLANT
GLOVE SURG ORTHO 9.0 STRL STRW (GLOVE) ×2 IMPLANT
GLOVE SURG SS PI 7.0 STRL IVOR (GLOVE) ×2 IMPLANT
GLOVE SURG SS PI 8.0 STRL IVOR (GLOVE) ×2 IMPLANT
GOWN DECONTAM XXLG NS DISP (GOWN DISPOSABLE) ×1 IMPLANT
GOWN PREVENTION PLUS XLARGE (GOWN DISPOSABLE) IMPLANT
GOWN SRG XL XLNG 56XLVL 4 (GOWN DISPOSABLE) ×1 IMPLANT
GOWN STRL NON-REIN XL XLG LVL4 (GOWN DISPOSABLE) ×4
KIT BASIN OR (CUSTOM PROCEDURE TRAY) ×2 IMPLANT
KIT ROOM TURNOVER OR (KITS) ×2 IMPLANT
MANIFOLD NEPTUNE II (INSTRUMENTS) ×2 IMPLANT
NS IRRIG 1000ML POUR BTL (IV SOLUTION) ×2 IMPLANT
PACK ORTHO EXTREMITY (CUSTOM PROCEDURE TRAY) ×2 IMPLANT
PAD ARMBOARD 7.5X6 YLW CONV (MISCELLANEOUS) ×4 IMPLANT
PAD CAST 4YDX4 CTTN HI CHSV (CAST SUPPLIES) ×1 IMPLANT
PADDING CAST COTTON 4X4 STRL (CAST SUPPLIES)
SPONGE GAUZE 4X4 12PLY (GAUZE/BANDAGES/DRESSINGS) ×4 IMPLANT
SPONGE LAP 18X18 X RAY DECT (DISPOSABLE) ×3 IMPLANT
STAPLER VISISTAT 35W (STAPLE) IMPLANT
STOCKINETTE IMPERVIOUS LG (DRAPES) IMPLANT
STOCKINETTE TUBULAR COTT 4X25 (GAUZE/BANDAGES/DRESSINGS) ×2 IMPLANT
SUCTION FRAZIER TIP 10 FR DISP (SUCTIONS) ×2 IMPLANT
SUT ETHILON 2 0 PSLX (SUTURE) ×5 IMPLANT
TOWEL OR 17X24 6PK STRL BLUE (TOWEL DISPOSABLE) ×2 IMPLANT
TOWEL OR 17X26 10 PK STRL BLUE (TOWEL DISPOSABLE) ×2 IMPLANT
TUBE CONNECTING 12X1/4 (SUCTIONS) ×2 IMPLANT
UNDERPAD 30X30 INCONTINENT (UNDERPADS AND DIAPERS) ×2 IMPLANT
WATER STERILE IRR 1000ML POUR (IV SOLUTION) IMPLANT

## 2013-03-25 NOTE — H&P (View-Only) (Signed)
Reason for Consult: Osteomyelitis bilateral great toe Referring Physician: Dr. Alfonzo Beers [name] is an 37 y.o. male.  HPI: Patient is a 37 year old gentleman well known to me with bilateral great toe osteomyelitis. Patient has had acute increase in purulent drainage from the right great toe.  Past Medical History  Diagnosis Date  . Hypertension   . Diabetes mellitus     type 2 iddm x 18 yrs  . Vascular disease     poor circulation to left foot  . Acute osteomyelitis, ankle and foot 07/30/2010    Qualifier: Diagnosis of  By: Daiva Eves MD, Remi Haggard      Past Surgical History  Procedure Laterality Date  . Cholecystectomy    . Toe amputation  2012    left foot; great toe and second toe  . Amputation  04/15/2011    Procedure: AMPUTATION RAY;  Surgeon: Nadara Mustard, MD;  Location: [facility] OR;  Service: Orthopedics;  Laterality: Left;  left foot third toe amputation and MPP joint and gastroc resection VS. achilles lengthing   . Amputation  04/25/2011    Procedure: AMPUTATION DIGIT;  Surgeon: Nadara Mustard, MD;  Location: [facility] OR;  Service: Orthopedics;  Laterality: Left;  Left foot 3rd toe amputation MTP joint, Gastroc Recession  Achilles Lengthening     History reviewed. No pertinent family history.  Social History:  reports that he has been smoking Cigarettes.  He has a .25 pack-year smoking history. He has never used smokeless tobacco. He reports that he uses illicit drugs (Marijuana) about 10 times per week. He reports that he does not drink alcohol.  Allergies: No Known Allergies  Medications: I have reviewed the patient's current medications.  Results for orders placed during the hospital encounter of 03/22/13 (from the past 48 hour(s))  CBC WITH DIFFERENTIAL     Status: Abnormal   Collection Time    03/22/13  6:48 PM      Result Value Range   WBC 11.5 (*) 4.0 - 10.5 K/uL   RBC 3.57 (*) 4.22 - 5.81 MIL/uL   Hemoglobin 11.2 (*) 13.0 - 17.0 g/dL   HCT 16.1 (*) 09.6 - 04.5 %   MCV  89.1  78.0 - 100.0 fL   MCH 31.4  26.0 - 34.0 pg   MCHC 35.2  30.0 - 36.0 g/dL   RDW 40.9  81.1 - 91.4 %   Platelets 191  150 - 400 K/uL   Neutrophils Relative % 71  43 - 77 %   Neutro Abs 8.2 (*) 1.7 - 7.7 K/uL   Lymphocytes Relative 18  12 - 46 %   Lymphs Abs 2.1  0.7 - 4.0 K/uL   Monocytes Relative 9  3 - 12 %   Monocytes Absolute 1.0  0.1 - 1.0 K/uL   Eosinophils Relative 2  0 - 5 %   Eosinophils Absolute 0.3  0.0 - 0.7 K/uL   Basophils Relative 0  0 - 1 %   Basophils Absolute 0.0  0.0 - 0.1 K/uL  COMPREHENSIVE METABOLIC PANEL     Status: Abnormal   Collection Time    03/22/13  6:48 PM      Result Value Range   Sodium 138  135 - 145 mEq/L   Potassium 4.1  3.5 - 5.1 mEq/L   Chloride 101  96 - 112 mEq/L   CO2 26  19 - 32 mEq/L   Glucose, Bld 166 (*) 70 - 99 mg/dL   BUN 27 (*)  6 - 23 mg/dL   Creatinine, Ser 1.61 (*) 0.50 - 1.35 mg/dL   Calcium 8.8  8.4 - 09.6 mg/dL   Total Protein 8.1  6.0 - 8.3 g/dL   Albumin 3.0 (*) 3.5 - 5.2 g/dL   AST 20  0 - 37 U/L   ALT 27  0 - 53 U/L   Alkaline Phosphatase 74  39 - 117 U/L   Total Bilirubin 0.3  0.3 - 1.2 mg/dL   GFR calc non Af Amer 39 (*) >90 mL/min   GFR calc Af Amer 45 (*) >90 mL/min   Comment: (NOTE)     The eGFR has been calculated using the CKD EPI equation.     This calculation has not been validated in all clinical situations.     eGFR's persistently <90 mL/min signify possible Chronic Kidney     Disease.  GLUCOSE, CAPILLARY     Status: Abnormal   Collection Time    03/22/13  8:53 PM      Result Value Range   Glucose-Capillary 180 (*) 70 - 99 mg/dL  GLUCOSE, CAPILLARY     Status: Abnormal   Collection Time    03/23/13  1:08 AM      Result Value Range   Glucose-Capillary 153 (*) 70 - 99 mg/dL  GLUCOSE, CAPILLARY     Status: Abnormal   Collection Time    03/23/13  5:40 AM      Result Value Range   Glucose-Capillary 124 (*) 70 - 99 mg/dL    Dg Foot Complete Left  03/22/2013   CLINICAL DATA:  Foot infection.  Wound posterior great toe.  EXAM: LEFT FOOT - COMPLETE 3+ VIEW  COMPARISON:  01/28/2013  FINDINGS: Interphalangeal amputation of the great toe. Complete digital amputations of the 2nd and 3rd digits.  Ulceration of the great toe stump. High-density material on the stump is likely appointment superficially. No osseous erosion. No new joint narrowing.  The remainder of the foot shows no acute findings, such as fracture or evidence of osseous infection. Degenerative changes most notable at the 1st MTP joint and along the dorsal talar neck.  IMPRESSION: Ulcer on the great toe stump. No evidence of osteomyelitis.   Electronically Signed   By: Tiburcio Pea [name]   On: 03/22/2013 22:34   Dg Foot Complete Right  03/22/2013   CLINICAL DATA:  Osteomyelitis with infection. Diabetes.  EXAM: RIGHT FOOT COMPLETE - 3+ VIEW  COMPARISON:  03/08/2013  FINDINGS: Soft tissue wound re- demonstrated with radiodense material in the web space, possibly overlying packing material. Vascular calcification. Cellulitic change but no definite erosive change to suggest osteomyelitis. If no contraindications, MRI is more sensitive.  IMPRESSION: Cellulitis without definite signs of osteomyelitis. Vascular calcification. Similar appearance to priors.   Electronically Signed   By: Davonna Belling [name]   On: 03/22/2013 22:39    Review of Systems  All other systems reviewed and are negative.   Blood pressure 116/74, pulse 83, temperature 98.7 F (37.1 C), temperature source Oral, resp. rate 16, height 6\' 1"  (1.854 m), weight 107.502 kg (237 lb), SpO2 97.00%. Physical Exam On examination patient has a palpable pulse bilaterally. Patient has osteomyelitis of the first metatarsal head right foot with a purulent draining ulcer beneath the MTP joint. Examination of the left foot he has an ulcer beneath the proximal phalanx of the left great toe. Assessment/Plan: Assessment: Osteomyelitis abscess right great toe first metatarsal head #2  osteomyelitis ulceration left great toe. Plan  will plan for surgery on Friday for right foot first ray amputation and left great toe amputation. Risks and benefits were discussed patient states he understands and wished to proceed at this time. We'll keep the patient on IV antibiotics for 48 hours before surgery to improve chances of  the incisions healing.  Saretta Dahlem V 03/23/2013, 6:53 AM

## 2013-03-25 NOTE — Progress Notes (Signed)
Called by patient in the room because he's bleeding.Patient was sitting at the side of bed,blood seeping out of dressing and on the floor.Per patient,he stood up to use the urinal.Assisted patient  back to bed,foot elevated on pillows,dressing reinforced with ABD pads and kerlix.Patient denies any c/o dizziness.VSS.Paged MD on call. Brytney Somes Joselita,RN

## 2013-03-25 NOTE — Anesthesia Postprocedure Evaluation (Signed)
  Anesthesia Post-op Note  Patient: [name]  Procedure(s) Performed: Procedure(s) with comments: AMPUTATION RAY (Bilateral) - Left Great Toe Amputation at  MTP Joint, Right 1st and 2nd Ray Amputation   Patient Location: PACU  Anesthesia Type:MAC  Level of Consciousness: awake and alert   Airway and Oxygen Therapy: Patient Spontanous Breathing  Post-op Pain: mild  Post-op Assessment: Post-op Vital signs reviewed, Patient's Cardiovascular Status Stable, Respiratory Function Stable, Patent Airway, No signs of Nausea or vomiting and Pain level controlled  Post-op Vital Signs: Reviewed and stable  Complications: No apparent anesthesia complications

## 2013-03-25 NOTE — Op Note (Signed)
OPERATIVE REPORT  DATE OF SURGERY: 03/25/2013  PATIENT:  [name],  37 y.o. male  PRE-OPERATIVE DIAGNOSIS:  Osteomyelitis Left Great Toe and Right MTP joint Great Toe  POST-OPERATIVE DIAGNOSIS:  Osteomyelitis Left Great Toe and Right MTP joint Great Toe  PROCEDURE:  Procedure(s): AMPUTATION RAY right foot first and second ray. Amputation left great toe at the MTP joint.   SURGEON:  Surgeon(s): Nadara Mustard, MD  ANESTHESIA:   regional  EBL:  Minimal ML  SPECIMEN:  No Specimen  TOURNIQUET:  * No tourniquets in log *  PROCEDURE DETAILS: Patient is a 37 year old gentleman with osteomyelitis abscess ulceration bilateral great toes. He has failed conservative care including IV antibiotics and presents at this time for surgical intervention. Risks and benefits were discussed including infection neurovascular injury nonhealing of the wound need for additional surgery. Patient states he understands and wished to proceed at this time. Description of procedure patient was brought to the operating room after undergoing a regional anesthesia. After adequate levels and anesthesia obtained patient's bilateral lower extremities were prepped using DuraPrep and draped into a sterile field. Attention was first focused on the left foot. The left great toe was amputated at the MTP joint. There was good petechial bleeding hemostasis was obtained the ends the wound was cleansed using saline and the incision was closed using 2-0 nylon. Attention was then focused on the right great toe the first ray was resected through the base of the first metatarsal. There was necrotic wound that extended down to the MTP joint of the second toe. This required the second metatarsal and second toe to be resected due to the infection of the second MTP joint. There was good petechial bleeding around the skin once with this was debrided. The wound was irrigated with normal saline and the skin was closed using 2-0 nylon. The  wounds were covered with Adaptic orthopedic sponges ABDs dressing Kerlix and Coban. Patient was taken to the PACU in stable condition.  PLAN OF CARE: Admit to inpatient   PATIENT DISPOSITION:  PACU - hemodynamically stable.   Nadara Mustard, MD 03/25/2013 7:41 PM

## 2013-03-25 NOTE — Anesthesia Preprocedure Evaluation (Signed)
Anesthesia Evaluation  Patient identified by MRN, date of birth, ID band Patient awake    Reviewed: Allergy & Precautions, H&P , NPO status , Patient's Chart, lab work & pertinent test results, reviewed documented beta blocker date and time   Airway Mallampati: II TM Distance: >3 FB     Dental  (+) Teeth Intact and Dental Advisory Given   Pulmonary          Cardiovascular hypertension, Pt. on medications + Peripheral Vascular Disease     Neuro/Psych    GI/Hepatic   Endo/Other  diabetes, Type 1, Insulin Dependent  Renal/GU Renal InsufficiencyRenal disease     Musculoskeletal   Abdominal   Peds  Hematology  (+) Blood dyscrasia, anemia ,   Anesthesia Other Findings   Reproductive/Obstetrics                           Anesthesia Physical Anesthesia Plan  ASA: III  Anesthesia Plan: MAC and Regional   Post-op Pain Management: MAC Combined w/ Regional for Post-op pain   Induction:   Airway Management Planned: Simple Face Mask and Natural Airway  Additional Equipment:   Intra-op Plan:   Post-operative Plan:   Informed Consent: I have reviewed the patients History and Physical, chart, labs and discussed the procedure including the risks, benefits and alternatives for the proposed anesthesia with the patient or authorized representative who has indicated his/her understanding and acceptance.     Plan Discussed with: Anesthesiologist and Surgeon  Anesthesia Plan Comments:         Anesthesia Quick Evaluation

## 2013-03-25 NOTE — Progress Notes (Signed)
Patient back from OR/PACU,alert,oriented.VSS.Patient able to move both lower extremities,exposed toes warm to touch.Patient able to wiggle toes.Elevated extremities on pillow.Will continue to monitor. Jaysen Wey Joselita,RN

## 2013-03-25 NOTE — Anesthesia Procedure Notes (Addendum)
Anesthesia Regional Block:  Ankle block  Pre-Anesthetic Checklist: ,, timeout performed, Correct Patient, Correct Site, Correct Laterality, Correct Procedure, Correct Position, site marked, Risks and benefits discussed,  Surgical consent,  Pre-op evaluation,  At surgeon's request and post-op pain management  Laterality: Left and Right  Prep: chloraprep and alcohol swabs       Needles:  Injection technique: Single-shot      Needle Gauge: 25 and 25 G    Additional Needles: Ankle block Narrative:  Start time: 03/25/2013 5:20 PM End time: 03/25/2013 5:25 PM Injection made incrementally with aspirations every 5 mL.  Performed by: Personally  Anesthesiologist: Maren Beach MD  Additional Notes: Bilat ankles. 30cc 2% Lidocaine and 25cc 0.5% Marcaine w/ epi w/o difficulty. Ant / Post Tibial nerves .  GES   Procedure Name: MAC Date/Time: 03/25/2013 6:00 PM Performed by: Jerilee Hoh Pre-anesthesia Checklist: Patient identified, Emergency Drugs available, Suction available and Patient being monitored Patient Re-evaluated:Patient Re-evaluated prior to inductionOxygen Delivery Method: Simple face mask Intubation Type: IV induction Placement Confirmation: positive ETCO2 and breath sounds checked- equal and bilateral

## 2013-03-25 NOTE — Progress Notes (Signed)
[facility] PROGRESS NOTE  [name] ZOX:[id number] DOB: 07-Jul-1975 DOA: 03/22/2013 PCP: Dorrene German, MD  HPI/Brief narrative 37 year old male patient with history of DM 2/IDDM complicated by PAD, possible peripheral neuropathy, HTN, prior history of osteomyelitis & left toe amputations presented to the ED on 03/22/13 due to fevers, draining wounds from both feet. He was evaluated by Dr. Lajoyce Corners who plans surgery on 03/25/13.  Assessment/Plan:  Osteomyelitis & abscess right great toe and osteomyelitis with ulceration of left great toe - Orthopedic consultation appreciated. - Continue empiric IV Zosyn and vancomycin. - Plan for right first ray amputation and left great toe amputation on 03/25/13.  Type II DM with possible peripheral neuropathy and PAD - Patient states that he has been off all antidiabetic medications for approximately a year. He states that he checks CBGs twice daily at home with highest reading being 120 mg per DL. - Continue SSI.  Acute on possible stage II chronic kidney disease from diabetic nephropathy - Acute renal failure precipitated by ACE inhibitors. - Hold ACE inhibitors. Continue gentle hydration and follow BMP. - Improving. Baseline creatinine probably in the 1.3-1.4 range. - Creatinine 2.11 > 1.82 > 1.58>1.62 : ? New elevated baseline - Follow BMP in am. Continue IVF additional day while NPO, surgery etc.  Anemia - Probably from chronic disease and chronic kidney disease - No active bleeding. - Stable. Follow CBC in a.m. and transfuse if hemoglobin less than 7 g per DL.  Hypertension  - Controlled    DVT prophylaxis: Heparin  Lines/catheters: PIV  Nutrition: NPO for surgery Activity:  Ad lib.  Code Status: Full  Family Communication: None  Disposition Plan: Home when medically stable ? DC on weekend.   Consultants:  Orthopedics   Procedures:  None   Antibiotics:  IV Zosyn 10/14 >  IV vancomycin 10/14 >    Subjective: Nausea without abdominal pain or vomiting since this morning.  Objective: Filed Vitals:   03/24/13 1900 03/24/13 2138 03/25/13 0548 03/25/13 0924  BP: 137/84 152/81 147/88 146/89  Pulse: 73 78 76 72  Temp: 98.5 F (36.9 C) 97.7 F (36.5 C) 98.3 F (36.8 C) 98.4 F (36.9 C)  TempSrc: Oral Oral Oral   Resp: 18 18 18 18   Height:      Weight:      SpO2: 100% 96% 100% 99%    Intake/Output Summary (Last 24 hours) at 03/25/13 1110 Last data filed at 03/25/13 0925  Gross per 24 hour  Intake 1493.33 ml  Output    351 ml  Net 1142.33 ml   Filed Weights   03/23/13 0102  Weight: 107.502 kg (237 lb)     Exam:  General exam: Moderately built and overweight male patient lying comfortably supine in bed  Respiratory system: Clear. No increased work of breathing. Cardiovascular system: S1 & S2 heard, RRR. No JVD, murmurs, gallops, clicks or pedal edema. Gastrointestinal system: Abdomen is nondistended, soft and nontender. Normal bowel sounds heard. Central nervous system: Alert and oriented. No focal neurological deficits. Extremities: Symmetric 5 x 5 power.Purulent draining ulcer beneath the right great toe stump/MTP joint. Small ulcer beneath left great toe proximal phalanx. Amputated left second and third toes.    Data Reviewed: Basic Metabolic Panel:  Recent Labs Lab 03/22/13 1848 03/23/13 0539 03/24/13 0440 03/25/13 0553  NA 138 140 137 138  K 4.1 4.2 4.1 4.2  CL 101 106 103 106  CO2 26 27 27 23   GLUCOSE 166* 119* 89 110*  BUN 27*  25* 18 17  CREATININE 2.11* 1.82* 1.58* 1.62*  CALCIUM 8.8 8.1* 8.4 8.6   Liver Function Tests:  Recent Labs Lab 03/22/13 1848  AST 20  ALT 27  ALKPHOS 74  BILITOT 0.3  PROT 8.1  ALBUMIN 3.0*   No results found for this basename: LIPASE, AMYLASE,  in the last 168 hours No results found for this basename: AMMONIA,  in the last 168 hours CBC:  Recent Labs Lab 03/22/13 1848 03/23/13 0539 03/24/13 0440  03/25/13 0553  WBC 11.5* 8.1 6.7 7.0  NEUTROABS 8.2*  --   --   --   HGB 11.2* 9.1* 8.9* 9.6*  HCT 31.8* 26.8* 26.6* 28.2*  MCV 89.1 89.3 90.5 87.6  PLT 191 155 172 196   Cardiac Enzymes: No results found for this basename: CKTOTAL, CKMB, CKMBINDEX, TROPONINI,  in the last 168 hours BNP (last 3 results) No results found for this basename: PROBNP,  in the last 8760 hours CBG:  Recent Labs Lab 03/24/13 0751 03/24/13 1204 03/24/13 1713 03/24/13 2132 03/25/13 0723  GLUCAP 100* 101* 101* 112* 103*    Recent Results (from the past 240 hour(s))  CULTURE, BLOOD (ROUTINE X 2)     Status: None   Collection Time    03/22/13 10:39 PM      Result Value Range Status   Specimen Description BLOOD RIGHT ARM   Final   Special Requests BOTTLES DRAWN AEROBIC AND ANAEROBIC   Final   Culture  Setup Time     Final   Value: 03/23/2013 04:37     Performed at [facility]   Culture     Final   Value:        BLOOD CULTURE RECEIVED NO GROWTH TO DATE CULTURE WILL BE HELD FOR 5 DAYS BEFORE ISSUING A FINAL NEGATIVE REPORT     Performed at [facility]   Report Status PENDING   Incomplete  CULTURE, BLOOD (ROUTINE X 2)     Status: None   Collection Time    03/22/13 10:40 PM      Result Value Range Status   Specimen Description BLOOD LEFT ARM   Final   Special Requests BOTTLES DRAWN AEROBIC AND ANAEROBIC 10CC EACH   Final   Culture  Setup Time     Final   Value: 03/23/2013 04:37     Performed at [facility]   Culture     Final   Value:        BLOOD CULTURE RECEIVED NO GROWTH TO DATE CULTURE WILL BE HELD FOR 5 DAYS BEFORE ISSUING A FINAL NEGATIVE REPORT     Performed at [facility]   Report Status PENDING   Incomplete  SURGICAL PCR SCREEN     Status: None   Collection Time    03/24/13 11:41 PM      Result Value Range Status   MRSA, PCR NEGATIVE  NEGATIVE Final   Staphylococcus aureus NEGATIVE  NEGATIVE Final   Comment:            The Xpert SA Assay (FDA      approved for NASAL specimens     in patients over 66 years of age),     is one component of     a comprehensive surveillance     program.  Test performance has     been validated by [employer] for patients greater     than or equal to 1  year old.     It is not intended     to diagnose infection nor to     guide or monitor treatment.      Additional labs: 1. None     Studies: No results found.      Scheduled Meds: . chlorhexidine  60 mL Topical Once  . heparin  5,000 Units Subcutaneous Q8H  . insulin aspart  0-5 Units Subcutaneous QHS  . insulin aspart  0-9 Units Subcutaneous TID WC  . piperacillin-tazobactam  3.375 g Intravenous Once  . piperacillin-tazobactam (ZOSYN)  IV  3.375 g Intravenous Q8H  . sodium chloride  500 mL Intravenous Once  . vancomycin  1,000 mg Intravenous BID   Continuous Infusions: . sodium chloride 100 mL/hr at 03/24/13 1720    Principal Problem:   Diabetic osteomyelitis b/l toes Active Problems:   Diabetic foot ulcers   AKI (acute kidney injury)   DM (diabetes mellitus) type II uncontrolled, periph vascular disorder   Renal failure, acute on chronic   Anemia   Essential hypertension, benign    Time spent: 25 minutes.    Marcellus Scott, MD, FACP, FHM. [facility] Pager [phone]  If 7PM-7AM, please contact night-coverage [email or web address] Password [email or web address] 03/25/2013, 11:10 AM    LOS: 3 days

## 2013-03-25 NOTE — Progress Notes (Signed)
Called received from Dr. Particia Nearing made aware of bleeding on patient's rt foot incision.Order to elevate extremity above level of heart.Will continue to monitor. Tavaughn Silguero Joselita,RN

## 2013-03-25 NOTE — Interval H&P Note (Signed)
History and Physical Interval Note:  03/25/2013 5:54 AM  [name]  has presented today for surgery, with the diagnosis of Osteomyelitis Left Great Toe and Right MTP joint Great Toe  The various methods of treatment have been discussed with the patient and family. After consideration of risks, benefits and other options for treatment, the patient has consented to  Procedure(s) with comments: AMPUTATION RAY (Bilateral) - Left Great Toe Amputation and MTP Joint, Right 1st Ray Amputation Great Toe as a surgical intervention .  The patient's history has been reviewed, patient examined, no change in status, stable for surgery.  I have reviewed the patient's chart and labs.  Questions were answered to the patient's satisfaction.     [name]

## 2013-03-25 NOTE — Transfer of Care (Signed)
Immediate Anesthesia Transfer of Care Note  Patient: [name]  Procedure(s) Performed: Procedure(s) with comments: AMPUTATION RAY (Bilateral) - Left Great Toe Amputation at  MTP Joint, Right 1st and 2nd Ray Amputation   Patient Location: PACU  Anesthesia Type:MAC  Level of Consciousness: awake, alert , oriented and patient cooperative  Airway & Oxygen Therapy: Patient Spontanous Breathing  Post-op Assessment: Report given to PACU RN, Post -op Vital signs reviewed and stable and Patient moving all extremities  Post vital signs: Reviewed and stable  Complications: No apparent anesthesia complications

## 2013-03-26 LAB — BASIC METABOLIC PANEL
BUN: 14 mg/dL (ref 6–23)
Chloride: 100 mEq/L (ref 96–112)
GFR calc Af Amer: 64 mL/min — ABNORMAL LOW (ref >90)
GFR calc non Af Amer: 56 mL/min — ABNORMAL LOW (ref >90)
Glucose, Bld: 144 mg/dL — ABNORMAL HIGH (ref 70–99)
Potassium: 4.1 mEq/L (ref 3.5–5.1)
Sodium: 135 mEq/L (ref 135–145)

## 2013-03-26 LAB — GLUCOSE, CAPILLARY
Glucose-Capillary: 130 mg/dL — ABNORMAL HIGH (ref 70–99)
Glucose-Capillary: 155 mg/dL — ABNORMAL HIGH (ref 70–99)
Glucose-Capillary: 111 mg/dL — ABNORMAL HIGH (ref 70–99)

## 2013-03-26 LAB — CBC
HCT: 24.6 % — ABNORMAL LOW (ref 39.0–52.0)
Hemoglobin: 8.5 g/dL — ABNORMAL LOW (ref 13.0–17.0)
MCHC: 34.6 g/dL (ref 30.0–36.0)
RBC: 2.81 MIL/uL — ABNORMAL LOW (ref 4.22–5.81)
RDW: 12.7 % (ref 11.5–15.5)
WBC: 8.7 10*3/uL (ref 4.0–10.5)

## 2013-03-26 NOTE — Progress Notes (Signed)
[facility] PROGRESS NOTE  [name] KZS:[id number] DOB: [birth year] DOA: 03/22/2013 PCP: Dorrene German, MD  HPI/Brief narrative 37 year old male patient with history of DM 2/IDDM complicated by PAD, possible peripheral neuropathy, HTN, prior history of osteomyelitis & left toe amputations presented to the ED on 03/22/13 due to fevers, draining wounds from both feet. He was evaluated by Dr. Lajoyce Corners who plans surgery on 03/25/13.  Assessment/Plan:  S/P Amputation of right foot 1st & 2nd ray & Left great toe at MTP on 10/17 for Osteomyelitis & abscess - Orthopedic consultation & FU appreciated. - Continue empiric IV Zosyn and vancomycin. Dr. Lajoyce Corners to advise duration & choice of Abx. - Some blood soiling of right foot dressing. Patient also has concerns/questions for Dr. Lajoyce Corners regarding right second toe amputation.  Type II DM with possible peripheral neuropathy and PAD - Patient states that he has been off all antidiabetic medications for approximately a year. He states that he checks CBGs twice daily at home with highest reading being 120 mg per DL. - Continue SSI. Good inpatient control.  Acute on possible stage II chronic kidney disease from diabetic nephropathy - Acute renal failure precipitated by ACE inhibitors. - Hold ACE inhibitors. Continue gentle hydration and follow BMP. - Improving. Baseline creatinine probably in the 1.3-1.4 range. - Creatinine 2.11 > 1.82 > 1.58>1.62 : ? New elevated baseline - Creatinine stable. DC IVF.  Anemia - Probably from chronic disease and chronic kidney disease - No active bleeding. - Stable. Follow CBC in a.m. and transfuse if hemoglobin less than 7 g per DL.  Hypertension  - Controlled    DVT prophylaxis: Heparin  Lines/catheters: PIV  Nutrition: Diabetic diet Activity:  Ad lib.  Code Status: Full  Family Communication: None  Disposition Plan: Home when medically stable ? DC on weekend-pending orthopedics  clearance.   Consultants:  Orthopedics   Procedures:  None   Antibiotics:  IV Zosyn 10/14 >  IV vancomycin 10/14 >   Subjective: Some pain and operated bilateral feet. No further nausea.  Objective: Filed Vitals:   03/25/13 2339 03/26/13 0030 03/26/13 0549 03/26/13 0821  BP: 124/82 125/79 134/81 129/78  Pulse: 80 81 87 83  Temp:   99.8 F (37.7 C) 99.4 F (37.4 C)  TempSrc:   Oral Oral  Resp:  18 16 16   Height:      Weight:      SpO2: 100% 100% 100% 100%    Intake/Output Summary (Last 24 hours) at 03/26/13 1128 Last data filed at 03/26/13 0700  Gross per 24 hour  Intake   1850 ml  Output   1750 ml  Net    100 ml   Filed Weights   03/23/13 0102  Weight: 107.502 kg (237 lb)     Exam:  General exam: Moderately built and overweight male patient lying comfortably supine in bed  Respiratory system: Clear. No increased work of breathing. Cardiovascular system: S1 & S2 heard, RRR. No JVD, murmurs, gallops, clicks or pedal edema. Telemetry: Sinus rhythm. Gastrointestinal system: Abdomen is nondistended, soft and nontender. Normal bowel sounds heard. Central nervous system: Alert and oriented. No focal neurological deficits. Extremities: Symmetric 5 x 5 power. Right foot dressing stained with blood on the medial aspect. Left foot dressing clean and dry.   Data Reviewed: Basic Metabolic Panel:  Recent Labs Lab 03/22/13 1848 03/23/13 0539 03/24/13 0440 03/25/13 0553 03/26/13 0544  NA 138 140 137 138 135  K 4.1 4.2 4.1 4.2 4.1  CL 101 106  103 106 100  CO2 26 27 27 23 26   GLUCOSE 166* 119* 89 110* 144*  BUN 27* 25* 18 17 14   CREATININE 2.11* 1.82* 1.58* 1.62* 1.56*  CALCIUM 8.8 8.1* 8.4 8.6 8.6   Liver Function Tests:  Recent Labs Lab 03/22/13 1848  AST 20  ALT 27  ALKPHOS 74  BILITOT 0.3  PROT 8.1  ALBUMIN 3.0*   No results found for this basename: LIPASE, AMYLASE,  in the last 168 hours No results found for this basename: AMMONIA,  in the  last 168 hours CBC:  Recent Labs Lab 03/22/13 1848 03/23/13 0539 03/24/13 0440 03/25/13 0553 03/26/13 0544  WBC 11.5* 8.1 6.7 7.0 8.7  NEUTROABS 8.2*  --   --   --   --   HGB 11.2* 9.1* 8.9* 9.6* 8.5*  HCT 31.8* 26.8* 26.6* 28.2* 24.6*  MCV 89.1 89.3 90.5 87.6 87.5  PLT 191 155 172 196 207   Cardiac Enzymes: No results found for this basename: CKTOTAL, CKMB, CKMBINDEX, TROPONINI,  in the last 168 hours BNP (last 3 results) No results found for this basename: PROBNP,  in the last 8760 hours CBG:  Recent Labs Lab 03/24/13 2132 03/25/13 0723 03/25/13 1133 03/25/13 1856 03/25/13 2125  GLUCAP 112* 103* 116* 90 79    Recent Results (from the past 240 hour(s))  CULTURE, BLOOD (ROUTINE X 2)     Status: None   Collection Time    03/22/13 10:39 PM      Result Value Range Status   Specimen Description BLOOD RIGHT ARM   Final   Special Requests BOTTLES DRAWN AEROBIC AND ANAEROBIC   Final   Culture  Setup Time     Final   Value: 03/23/2013 04:37     Performed at [facility]   Culture     Final   Value:        BLOOD CULTURE RECEIVED NO GROWTH TO DATE CULTURE WILL BE HELD FOR 5 DAYS BEFORE ISSUING A FINAL NEGATIVE REPORT     Performed at [facility]   Report Status PENDING   Incomplete  CULTURE, BLOOD (ROUTINE X 2)     Status: None   Collection Time    03/22/13 10:40 PM      Result Value Range Status   Specimen Description BLOOD LEFT ARM   Final   Special Requests BOTTLES DRAWN AEROBIC AND ANAEROBIC 10CC EACH   Final   Culture  Setup Time     Final   Value: 03/23/2013 04:37     Performed at [facility]   Culture     Final   Value:        BLOOD CULTURE RECEIVED NO GROWTH TO DATE CULTURE WILL BE HELD FOR 5 DAYS BEFORE ISSUING A FINAL NEGATIVE REPORT     Performed at [facility]   Report Status PENDING   Incomplete  SURGICAL PCR SCREEN     Status: None   Collection Time    03/24/13 11:41 PM      Result Value Range Status   MRSA,  PCR NEGATIVE  NEGATIVE Final   Staphylococcus aureus NEGATIVE  NEGATIVE Final   Comment:            The Xpert SA Assay (FDA     approved for NASAL specimens     in patients over 58 years of age),     is one component of     a comprehensive surveillance  program.  Test performance has     been validated by [facility] for patients greater     than or equal to 64 year old.     It is not intended     to diagnose infection nor to     guide or monitor treatment.      Additional labs: 1. None     Studies: No results found.      Scheduled Meds: . fentaNYL  50-100 mcg Intravenous Once  . heparin  5,000 Units Subcutaneous Q8H  . insulin aspart  0-5 Units Subcutaneous QHS  . insulin aspart  0-9 Units Subcutaneous TID WC  . piperacillin-tazobactam  3.375 g Intravenous Once  . piperacillin-tazobactam (ZOSYN)  IV  3.375 g Intravenous Q8H  . sodium chloride  500 mL Intravenous Once  . vancomycin  1,000 mg Intravenous BID   Continuous Infusions: . sodium chloride 100 mL/hr at 03/26/13 0707  . sodium chloride      Principal Problem:   Diabetic osteomyelitis b/l toes Active Problems:   Diabetic foot ulcers   AKI (acute kidney injury)   DM (diabetes mellitus) type II uncontrolled, periph vascular disorder   Renal failure, acute on chronic   Anemia   Essential hypertension, benign    Time spent: 25 minutes.    Marcellus Scott, MD, FACP, FHM. [facility] Pager [phone]  If 7PM-7AM, please contact night-coverage [email or web address] Password [email or web address] 03/26/2013, 11:28 AM    LOS: 4 days

## 2013-03-26 NOTE — Progress Notes (Signed)
Orthopedic Tech Progress Note Patient Details:  [name] 25-May-1976 [id number]  Ortho Devices Type of Ortho Device: Postop shoe/boot Ortho Device/Splint Location: Bilateral post op shoes Ortho Device/Splint Interventions: Application   [name] 03/26/2013, 7:04 AM

## 2013-03-26 NOTE — Plan of Care (Signed)
Problem: Acute Rehab PT Goals(only PT should resolve) Goal: Pt Will Go Up/Down Stairs Family to be able to demo good technique with wheelchair mobility up/down 3 steps.

## 2013-03-26 NOTE — Progress Notes (Signed)
Notified Ortho tech of order for bilateral post op boot. Horace Lukas Joselita,RN

## 2013-03-26 NOTE — Progress Notes (Signed)
   CARE MANAGEMENT NOTE 03/26/2013  Patient:  [name], [name]   Account Number:  [id number]  Date Initiated:  03/24/2013  Documentation initiated by:  ROYAL,CHERYL  Subjective/Objective Assessment:   Consult for [facility] needs, not orders yet.     Action/Plan:   Following for identifed HH needs and orders.   Anticipated DC Date:  03/28/2013   Anticipated DC Plan:  HOME W HOME HEALTH SERVICES  In-house referral  Clinical Social Worker      DC Associate Professor  CM consult      [facility] Choice  HOME HEALTH   Choice offered to / List presented to:             [facility] agency  [facility]   Status of service:  In process, will continue to follow Medicare Important Message given?   (If response is "NO", the following Medicare IM given date fields will be blank) Date Medicare IM given:   Date Additional Medicare IM given:    Discharge Disposition:    Per UR Regulation:    If discussed at Long Length of Stay Meetings, dates discussed:    Comments:  03/26/13 16:15 CM spoke to pt in room and pt states he will be returning to his mother's address upon discharge.  CM asked if he would be at [address], [place], [place] [address] for home health services.  Pt has Medicaid for insurance and choice was offered.  Pt chooses [facility] to deliver Home Health services.  CM will fax referral once home health orders are written.  Will continue to follow for discharge needs. Freddy Jaksch, BSN, CM [phone].

## 2013-03-26 NOTE — Evaluation (Signed)
Physical Therapy Evaluation Patient Details Name: [name] MRN: [id number] DOB: 07/12/1975 Today's Date: 03/26/2013 Time: 4098-1191 PT Time Calculation (min): 14 min  PT Assessment / Plan / Recommendation History of Present Illness   Pt is a 37 y.o. Male adm with bil great toe osteomyelitis. PMH includes DM, HTN, and vascular disease.    Clinical Impression  Patient is s/p Rt 1st & 2nd ray amputation and Lt great toe amputation surgery resulting in functional limitations due to the deficits listed below (see PT Problem List). Patient will benefit from skilled PT to increase their independence and safety with mobility to allow discharge to the venue listed below. Pt able to perform ant/post transfer very well for first session. Pt will need ramp or family education on stair amb with use of wheelchair mobility to enter house due to NWB status on bil LEs.  Pt reports he will be staying with this family and have 24/7 (A) upon acute D/C.   PT Assessment  Patient needs continued PT services    Follow Up Recommendations  Supervision - Intermittent;Supervision for mobility/OOB    Does the patient have the potential to tolerate intense rehabilitation      Barriers to Discharge Inaccessible home environment pt will need ramp or have family members educated on stair training for wheelchair mobility     Equipment Recommendations  Wheelchair (measurements PT);Wheelchair cushion (measurements PT);Other (comment);3in1 (PT) (wheelchair with elevated leg rests )    Recommendations for Other Services     Frequency Min 4X/week    Precautions / Restrictions Precautions Precautions: None Restrictions Weight Bearing Restrictions: Yes RLE Weight Bearing: Non weight bearing LLE Weight Bearing: Non weight bearing   Pertinent Vitals/Pain 7/10 Rt LE > Lt LE ; patient repositioned for comfort in chair       Mobility  Bed Mobility Bed Mobility: Supine to Sit;Sitting - Scoot to Edge of Bed Supine  to Sit: HOB flat;5: Supervision Sitting - Scoot to Edge of Bed: 5: Supervision Details for Bed Mobility Assistance: cues to come into long sitting position with HOB flattened to simulate home enviroment; no physical (A) needed  Transfers Transfers: Risk manager: To lower surface;5: Supervision Details for Transfer Assistance: pt required setup (A) to bring chair to bedside; cues for sequencing and to maintain NWB status on bil LEs; no physical (A) needed;  Ambulation/Gait Ambulation/Gait Assistance: Not tested (comment) (pt bil LE NWB) Stairs: No Wheelchair Mobility Wheelchair Mobility: No         PT Diagnosis: Difficulty walking;Acute pain  PT Problem List: Decreased knowledge of use of DME;Decreased mobility;Pain PT Treatment Interventions: DME instruction;Functional mobility training;Therapeutic exercise;Therapeutic activities;Balance training;Neuromuscular re-education;Wheelchair mobility training;Patient/family education     PT Goals(Current goals can be found in the care plan section) Acute Rehab PT Goals Patient Stated Goal: to have less pain; especially in the right foot PT Goal Formulation: With patient Time For Goal Achievement: 04/02/13 Potential to Achieve Goals: Good  Visit Information  Last PT Received On: 03/26/13 Assistance Needed: +1       Prior Functioning  Home Living Family/patient expects to be discharged to:: Private residence Living Arrangements: Other relatives;Parent Available Help at Discharge: Family;Available 24 hours/day Type of Home: House Home Access: Stairs to enter [employer] of Steps: 3 Home Layout: One level Home Equipment: None Additional Comments: pt reported that he has walk in shower; believes a wheelchair will fit through doors in his house  Prior Function Level of Independence: Independent Communication Communication: No difficulties Dominant  Hand: Left    Cognition   Cognition Arousal/Alertness: Awake/alert Behavior During Therapy: WFL for tasks assessed/performed Overall Cognitive Status: Within Functional Limits for tasks assessed    Extremity/Trunk Assessment Upper Extremity Assessment Upper Extremity Assessment: Overall WFL for tasks assessed Lower Extremity Assessment Lower Extremity Assessment: RLE deficits/detail;LLE deficits/detail RLE Deficits / Details: unable to test LEs; hips and knees overall WFL RLE: Unable to fully assess due to immobilization;Unable to fully assess due to pain RLE Sensation: history of peripheral neuropathy LLE Deficits / Details: unable to assess LEs  LLE: Unable to fully assess due to pain;Unable to fully assess due to immobilization LLE Sensation: history of peripheral neuropathy Cervical / Trunk Assessment Cervical / Trunk Assessment: Normal   Balance Balance Balance Assessed: Yes Static Sitting Balance Static Sitting - Balance Support: Bilateral upper extremity supported;Feet unsupported Static Sitting - Level of Assistance: 7: Independent  End of Session PT - End of Session Activity Tolerance: Patient tolerated treatment well Patient left: in chair;with call bell/phone within reach Nurse Communication: Mobility status  GP     Donell Sievert, [place] [phone] 03/26/2013, 11:32 AM

## 2013-03-27 LAB — GLUCOSE, CAPILLARY
Glucose-Capillary: 156 mg/dL — ABNORMAL HIGH (ref 70–99)
Glucose-Capillary: 106 mg/dL — ABNORMAL HIGH (ref 70–99)
Glucose-Capillary: 133 mg/dL — ABNORMAL HIGH (ref 70–99)

## 2013-03-27 LAB — CBC
HCT: 23.6 % — ABNORMAL LOW (ref 39.0–52.0)
MCH: 30.7 pg (ref 26.0–34.0)
MCHC: 34.7 g/dL (ref 30.0–36.0)
Platelets: 229 10*3/uL (ref 150–400)
RBC: 2.67 MIL/uL — ABNORMAL LOW (ref 4.22–5.81)
RDW: 13 % (ref 11.5–15.5)

## 2013-03-27 NOTE — Progress Notes (Signed)
Subjective: 2 Days Post-Op Procedure(s) (LRB): AMPUTATION RAY (Bilateral) Patient reports pain as mild.    Objective: Vital signs in last 24 hours: Temp:  [98.1 F (36.7 C)-98.9 F (37.2 C)] 98.5 F (36.9 C) (10/19 0503) Pulse Rate:  [78-87] 78 (10/19 0503) Resp:  [18-20] 18 (10/19 0503) BP: (106-142)/(65-80) 111/65 mmHg (10/19 0503) SpO2:  [98 %-99 %] 98 % (10/19 0503)  Intake/Output from previous day: 10/18 0701 - 10/19 0700 In: 600 [P.O.:600] Out: 2625 [Urine:2625] Intake/Output this shift: Total I/O In: -  Out: 800 [Urine:800]   Recent Labs  03/25/13 0553 03/26/13 0544 03/27/13 0405  HGB 9.6* 8.5* 8.2*    Recent Labs  03/26/13 0544 03/27/13 0405  WBC 8.7 10.4  RBC 2.81* 2.67*  HCT 24.6* 23.6*  PLT 207 229    Recent Labs  03/25/13 0553 03/26/13 0544  NA 138 135  K 4.2 4.1  CL 106 100  CO2 23 26  BUN 17 14  CREATININE 1.62* 1.56*  GLUCOSE 110* 144*  CALCIUM 8.6 8.6   No results found for this basename: LABPT, INR,  in the last 72 hours  Dressing changed bloody drainage. he has been FWB .  therapy ordered. needs walker.  has bilat foot problems and needs to be TDWB on right foot transfers to recliner.   Assessment/Plan: 2 Days Post-Op Procedure(s) (LRB): AMPUTATION RAY (Bilateral)  cont. IV ABX   Edges of right foot skin look good. Blood soaked dressing changed. Has foot edema and needs elevation.   Jerline Linzy C 03/27/2013, 11:39 AM

## 2013-03-27 NOTE — Progress Notes (Signed)
[facility] PROGRESS NOTE  [name] ZOX:[id number] DOB: 02/10/1976 DOA: 03/22/2013 PCP: Dorrene German, MD  HPI/Brief narrative 37 year old male patient with history of DM 2/IDDM complicated by PAD, possible peripheral neuropathy, HTN, prior history of osteomyelitis & left toe amputations presented to the ED on 03/22/13 due to fevers, draining wounds from both feet. He was evaluated by Dr. Lajoyce Corners who plans surgery on 03/25/13.  Assessment/Plan:  S/P Amputation of right foot 1st & 2nd ray & Left great toe at MTP on 10/17 for Osteomyelitis & abscess - Orthopedic consultation & FU appreciated. - Continue empiric IV Zosyn and vancomycin. Dr. Lajoyce Corners to advise duration & choice of Abx, activity/weight bearing status, dressing change and OP FU recommendations . - Patient also has concerns/questions for Dr. Lajoyce Corners regarding right second toe amputation. - Ortho follow up appreciated- recommend continued IV Abx.  Type II DM with possible peripheral neuropathy and PAD - Patient states that he has been off all antidiabetic medications for approximately a year. He states that he checks CBGs twice daily at home with highest reading being 120 mg per DL. - Continue SSI. Good inpatient control.  Acute on possible stage II chronic kidney disease from diabetic nephropathy - Acute renal failure precipitated by ACE inhibitors. - Hold ACE inhibitors. Continue gentle hydration and follow BMP. - Improving. Baseline creatinine probably in the 1.3-1.4 range. - Creatinine 2.11 > 1.82 > 1.58>1.62 : ? New elevated baseline - Creatinine stable. DCed IVF.  Anemia - Probably from chronic disease and chronic kidney disease - No active bleeding. - Stable. Follow CBC in a.m. and transfuse if hemoglobin less than 7 g per DL.  Hypertension  - Controlled    DVT prophylaxis: Heparin  Lines/catheters: PIV  Nutrition: Diabetic diet Activity:  Ad lib.  Code Status: Full  Family Communication: None  Disposition  Plan: Home when medically stable -pending orthopedics clearance.   Consultants:  Orthopedics   Procedures:  None   Antibiotics:  IV Zosyn 10/14 >  IV vancomycin 10/14 >   Subjective: Mild pain in feet/surgery site. Vomited once this am. Denies abdominal pain or nausea.  Objective: Filed Vitals:   03/26/13 1415 03/26/13 1842 03/26/13 2113 03/27/13 0503  BP: 138/78 142/80 106/74 111/65  Pulse: 87 83 79 78  Temp: 98.4 F (36.9 C) 98.1 F (36.7 C) 98.9 F (37.2 C) 98.5 F (36.9 C)  TempSrc: Oral Oral Oral Oral  Resp: 20 20 20 18   Height:      Weight:      SpO2: 98% 99% 98% 98%    Intake/Output Summary (Last 24 hours) at 03/27/13 1402 Last data filed at 03/27/13 0936  Gross per 24 hour  Intake    360 ml  Output   3125 ml  Net  -2765 ml   Filed Weights   03/23/13 0102  Weight: 107.502 kg (237 lb)     Exam:  General exam: Moderately built and overweight male patient lying comfortably supine in bed  Respiratory system: Clear. No increased work of breathing. Cardiovascular system: S1 & S2 heard, RRR. No JVD, murmurs, gallops, clicks or pedal edema.  Gastrointestinal system: Abdomen is nondistended, soft and nontender. Normal bowel sounds heard. Central nervous system: Alert and oriented. No focal neurological deficits. Extremities: Symmetric 5 x 5 power. Right foot dressing stained with blood on the medial aspect- no worse than yesterday. (was seen prior to dressing change by Ortho today). Left foot dressing clean and dry.   Data Reviewed: Basic Metabolic Panel:  Recent  Labs Lab 03/22/13 1848 03/23/13 0539 03/24/13 0440 03/25/13 0553 03/26/13 0544  NA 138 140 137 138 135  K 4.1 4.2 4.1 4.2 4.1  CL 101 106 103 106 100  CO2 26 27 27 23 26   GLUCOSE 166* 119* 89 110* 144*  BUN 27* 25* 18 17 14   CREATININE 2.11* 1.82* 1.58* 1.62* 1.56*  CALCIUM 8.8 8.1* 8.4 8.6 8.6   Liver Function Tests:  Recent Labs Lab 03/22/13 1848  AST 20  ALT 27  ALKPHOS  74  BILITOT 0.3  PROT 8.1  ALBUMIN 3.0*   No results found for this basename: LIPASE, AMYLASE,  in the last 168 hours No results found for this basename: AMMONIA,  in the last 168 hours CBC:  Recent Labs Lab 03/22/13 1848 03/23/13 0539 03/24/13 0440 03/25/13 0553 03/26/13 0544 03/27/13 0405  WBC 11.5* 8.1 6.7 7.0 8.7 10.4  NEUTROABS 8.2*  --   --   --   --   --   HGB 11.2* 9.1* 8.9* 9.6* 8.5* 8.2*  HCT 31.8* 26.8* 26.6* 28.2* 24.6* 23.6*  MCV 89.1 89.3 90.5 87.6 87.5 88.4  PLT 191 155 172 196 207 229   Cardiac Enzymes: No results found for this basename: CKTOTAL, CKMB, CKMBINDEX, TROPONINI,  in the last 168 hours BNP (last 3 results) No results found for this basename: PROBNP,  in the last 8760 hours CBG:  Recent Labs Lab 03/26/13 1203 03/26/13 1623 03/26/13 2116 03/27/13 0820 03/27/13 1223  GLUCAP 130* 155* 111* 120* 156*    Recent Results (from the past 240 hour(s))  CULTURE, BLOOD (ROUTINE X 2)     Status: None   Collection Time    03/22/13 10:39 PM      Result Value Range Status   Specimen Description BLOOD RIGHT ARM   Final   Special Requests BOTTLES DRAWN AEROBIC AND ANAEROBIC   Final   Culture  Setup Time     Final   Value: 03/23/2013 04:37     Performed at [facility]   Culture     Final   Value:        BLOOD CULTURE RECEIVED NO GROWTH TO DATE CULTURE WILL BE HELD FOR 5 DAYS BEFORE ISSUING A FINAL NEGATIVE REPORT     Performed at [facility]   Report Status PENDING   Incomplete  CULTURE, BLOOD (ROUTINE X 2)     Status: None   Collection Time    03/22/13 10:40 PM      Result Value Range Status   Specimen Description BLOOD LEFT ARM   Final   Special Requests BOTTLES DRAWN AEROBIC AND ANAEROBIC 10CC EACH   Final   Culture  Setup Time     Final   Value: 03/23/2013 04:37     Performed at [facility]   Culture     Final   Value:        BLOOD CULTURE RECEIVED NO GROWTH TO DATE CULTURE WILL BE HELD FOR 5 DAYS BEFORE  ISSUING A FINAL NEGATIVE REPORT     Performed at [facility]   Report Status PENDING   Incomplete  SURGICAL PCR SCREEN     Status: None   Collection Time    03/24/13 11:41 PM      Result Value Range Status   MRSA, PCR NEGATIVE  NEGATIVE Final   Staphylococcus aureus NEGATIVE  NEGATIVE Final   Comment:  The Xpert SA Assay (FDA     approved for NASAL specimens     in patients over 30 years of age),     is one component of     a comprehensive surveillance     program.  Test performance has     been validated by [employer] for patients greater     than or equal to 36 year old.     It is not intended     to diagnose infection nor to     guide or monitor treatment.      Additional labs: 1. None     Studies: No results found.      Scheduled Meds: . heparin  5,000 Units Subcutaneous Q8H  . insulin aspart  0-9 Units Subcutaneous TID WC  . piperacillin-tazobactam  3.375 g Intravenous Once  . piperacillin-tazobactam (ZOSYN)  IV  3.375 g Intravenous Q8H  . sodium chloride  500 mL Intravenous Once  . vancomycin  1,000 mg Intravenous BID   Continuous Infusions:    Principal Problem:   Diabetic osteomyelitis b/l toes Active Problems:   Diabetic foot ulcers   AKI (acute kidney injury)   DM (diabetes mellitus) type II uncontrolled, periph vascular disorder   Renal failure, acute on chronic   Anemia   Essential hypertension, benign    Time spent: 20 minutes.    Marcellus Scott, MD, FACP, FHM. [facility] Pager [phone]  If 7PM-7AM, please contact night-coverage [email or web address] Password [email or web address] 03/27/2013, 2:02 PM    LOS: 5 days

## 2013-03-28 ENCOUNTER — Encounter (HOSPITAL_COMMUNITY): Payer: Self-pay | Admitting: Orthopedic Surgery

## 2013-03-28 LAB — CBC
Hemoglobin: 8.1 g/dL — ABNORMAL LOW (ref 13.0–17.0)
MCH: 30.2 pg (ref 26.0–34.0)
MCHC: 34.5 g/dL (ref 30.0–36.0)
RDW: 12.8 % (ref 11.5–15.5)
WBC: 10.4 10*3/uL (ref 4.0–10.5)

## 2013-03-28 LAB — GLUCOSE, CAPILLARY: Glucose-Capillary: 126 mg/dL — ABNORMAL HIGH (ref 70–99)

## 2013-03-28 MED ORDER — DOXYCYCLINE HYCLATE 100 MG PO TABS: 100.0000 mg | ORAL_TABLET | Freq: Two times a day (BID) | ORAL | Status: DC

## 2013-03-28 MED ORDER — OXYCODONE-ACETAMINOPHEN 5-325 MG PO TABS: 1.0000 | ORAL_TABLET | Freq: Four times a day (QID) | ORAL | Status: DC | PRN

## 2013-03-28 NOTE — Progress Notes (Signed)
Physical Therapy Treatment Patient Details Name: [name] MRN: [id number] DOB: June 02, 1976 Today's Date: 03/28/2013 Time: 4098-1191 PT Time Calculation (min): 24 min  PT Assessment / Plan / Recommendation  History of Present Illness Pt is a 37 y.o. Male adm with bil great toe osteomyelitis. PMH includes DM, HTN, and vascular disease.     PT Comments   Treatment session focused on practicing transfers with SPT while WB through heels only with RW. Pt required min guard (A) for this time of transfer. Discussed ant/posterior transfer bed <> wheelchair <> 3 in 1. Pt reports he has been independent with setup (A) only since last session. Discussed and demo pressure relief techniques in wheelchair and in bed to prevent pressure ulcers. Will cont to f/u with pt to increase independence with transfers. Pt will have 24/7 (A) and could benefit from HHPT pending rehab progress in acute setting to maximize independence with transfers.   Follow Up Recommendations  Supervision - Intermittent;Supervision for mobility/OOB; home health PT      Does the patient have the potential to tolerate intense rehabilitation     Barriers to Discharge        Equipment Recommendations  Wheelchair (measurements PT);Wheelchair cushion (measurements PT);Other (comment);3in1 (PT);Rolling walker with 5" wheels    Recommendations for Other Services    Frequency Min 4X/week   Progress towards PT Goals Progress towards PT goals: Progressing toward goals  Plan Current plan remains appropriate    Precautions / Restrictions Precautions Precautions: None Required Braces or Orthoses: Other Brace/Splint Other Brace/Splint: bil post op shoes Restrictions Weight Bearing Restrictions: Yes RLE Weight Bearing: Non weight bearing LLE Weight Bearing: Non weight bearing Other Position/Activity Restrictions: may weight bear through heels for transfers   Pertinent Vitals/Pain Did not rate pain.     Mobility  Bed Mobility Bed  Mobility: Supine to Sit;Sitting - Scoot to Edge of Bed Supine to Sit: 6: Modified independent (Device/Increase time) Sitting - Scoot to Edge of Bed: 6: Modified independent (Device/Increase time) Sit to Supine: 6: Modified independent (Device/Increase time) Details for Bed Mobility Assistance: pt demo good technique and ability to perform bed mobilty without physical (A)  Transfers Transfers: Sit to Stand;Stand to Sit Sit to Stand: 4: Min assist;From bed Stand to Sit: To chair/3-in-1;With armrests;4: Min guard Details for Transfer Assistance: pt with difficulty initially powering up to stand through bil Heels and UEs; cues for hand placement and sequencing with RW: pt required incr time due to pain Ambulation/Gait Ambulation/Gait Assistance: 4: Min assist Ambulation Distance (Feet): 5 Feet Assistive device: Rolling walker Ambulation/Gait Assistance Details: cues for gt sequencing and to maintain WB through bil LEs; min guard to steady and balance Gait Pattern: Step-to pattern (on bil heels) Gait velocity: decreased Stairs: No Wheelchair Mobility Wheelchair Mobility: No         PT Diagnosis:    PT Problem List:   PT Treatment Interventions:     PT Goals (current goals can now be found in the care plan section) Acute Rehab PT Goals Patient Stated Goal: to get home soon PT Goal Formulation: With patient Time For Goal Achievement: 04/02/13 Potential to Achieve Goals: Good  Visit Information  Last PT Received On: 03/28/13 Assistance Needed: +1 History of Present Illness: Pt is a 37 y.o. Male adm with bil great toe osteomyelitis. PMH includes DM, HTN, and vascular disease.      Subjective Data  Subjective: Pt lying supine with wife present; agreeable to therapy. reports he has a ramp at his  house now Patient Stated Goal: to get home soon   Cognition  Cognition Arousal/Alertness: Awake/alert Behavior During Therapy: WFL for tasks assessed/performed Overall Cognitive Status:  Within Functional Limits for tasks assessed    Balance  Balance Balance Assessed: Yes Static Sitting Balance Static Sitting - Balance Support: Bilateral upper extremity supported;Feet unsupported Static Sitting - Level of Assistance: 7: Independent Static Standing Balance Static Standing - Balance Support: Bilateral upper extremity supported;During functional activity Static Standing - Level of Assistance: 5: Stand by assistance  End of Session PT - End of Session Equipment Utilized During Treatment: Gait belt;Other (comment) (post op shoes ) Activity Tolerance: Patient tolerated treatment well Patient left: in chair;with call bell/phone within reach;Other (comment) (OT present ) Nurse Communication: Mobility status;Weight bearing status;Precautions   GP     Donell Sievert, [place] [phone] 03/28/2013, 1:01 PM

## 2013-03-28 NOTE — Progress Notes (Signed)
Patient ID: [name], male   DOB: 02-09-1976, 37 y.o.   MRN: [id number] Patient safe for discharge to home. Change dressings today. Weightbearing on the heels bilaterally for transfer training no gait training. I'll followup in the office in 2 weeks.

## 2013-03-28 NOTE — Progress Notes (Signed)
ANTIBIOTIC CONSULT NOTE - FOLLOW UP  Pharmacy Consult for Vancomycin + Zosyn Indication: Right and Left foot ulcers/osteo (s/p partial amputations on 10/17)  No Known Allergies  Patient Measurements: Height: 6\' 1"  (185.4 cm) Weight: 237 lb (107.502 kg) IBW/kg (Calculated) : 79.9  Vital Signs: Temp: 98.6 F (37 C) (10/20 0917) Temp src: Oral (10/20 0550) BP: 124/77 mmHg (10/20 0917) Pulse Rate: 86 (10/20 0917) Intake/Output from previous day: 10/19 0701 - 10/20 0700 In: 480 [P.O.:480] Out: 3600 [Urine:3600] Intake/Output from this shift: Total I/O In: 200 [P.O.:200] Out: 300 [Urine:300]  Labs:  Recent Labs  03/26/13 0544 03/27/13 0405 03/28/13 0525  WBC 8.7 10.4 10.4  HGB 8.5* 8.2* 8.1*  PLT 207 229 266  CREATININE 1.56*  --   --    Estimated Creatinine Clearance: 84.2 ml/min (by C-G formula based on Cr of 1.56). No results found for this basename: VANCOTROUGH, VANCOPEAK, VANCORANDOM, GENTTROUGH, GENTPEAK, GENTRANDOM, TOBRATROUGH, TOBRAPEAK, TOBRARND, AMIKACINPEAK, AMIKACINTROU, AMIKACIN,  in the last 72 hours   Microbiology: Recent Results (from the past 720 hour(s))  CULTURE, BLOOD (ROUTINE X 2)     Status: None   Collection Time    03/22/13 10:39 PM      Result Value Range Status   Specimen Description BLOOD RIGHT ARM   Final   Special Requests BOTTLES DRAWN AEROBIC AND ANAEROBIC   Final   Culture  Setup Time     Final   Value: 03/23/2013 04:37     Performed at [facility]   Culture     Final   Value:        BLOOD CULTURE RECEIVED NO GROWTH TO DATE CULTURE WILL BE HELD FOR 5 DAYS BEFORE ISSUING A FINAL NEGATIVE REPORT     Performed at [facility]   Report Status PENDING   Incomplete  CULTURE, BLOOD (ROUTINE X 2)     Status: None   Collection Time    03/22/13 10:40 PM      Result Value Range Status   Specimen Description BLOOD LEFT ARM   Final   Special Requests BOTTLES DRAWN AEROBIC AND ANAEROBIC 10CC EACH   Final   Culture   Setup Time     Final   Value: 03/23/2013 04:37     Performed at [facility]   Culture     Final   Value:        BLOOD CULTURE RECEIVED NO GROWTH TO DATE CULTURE WILL BE HELD FOR 5 DAYS BEFORE ISSUING A FINAL NEGATIVE REPORT     Performed at [facility]   Report Status PENDING   Incomplete  SURGICAL PCR SCREEN     Status: None   Collection Time    03/24/13 11:41 PM      Result Value Range Status   MRSA, PCR NEGATIVE  NEGATIVE Final   Staphylococcus aureus NEGATIVE  NEGATIVE Final   Comment:            The Xpert SA Assay (FDA     approved for NASAL specimens     in patients over 47 years of age),     is one component of     a comprehensive surveillance     program.  Test performance has     been validated by [employer] for patients greater     than or equal to 43 year old.     It is not intended     to diagnose  infection nor to     guide or monitor treatment.    Anti-infectives   Start     Dose/Rate Route Frequency Ordered Stop   03/24/13 1230  vancomycin (VANCOCIN) IVPB 1000 mg/200 mL premix     1,000 mg 200 mL/hr over 60 Minutes Intravenous 2 times daily 03/24/13 1150     03/23/13 2200  vancomycin (VANCOCIN) 1,500 mg in sodium chloride 0.9 % 500 mL IVPB  Status:  Discontinued     1,500 mg 250 mL/hr over 120 Minutes Intravenous Every 24 hours 03/22/13 2215 03/24/13 1150   03/23/13 0500  piperacillin-tazobactam (ZOSYN) IVPB 3.375 g     3.375 g 12.5 mL/hr over 240 Minutes Intravenous Every 8 hours 03/22/13 2215     03/22/13 2230  piperacillin-tazobactam (ZOSYN) IVPB 3.375 g  Status:  Discontinued     3.375 g 100 mL/hr over 30 Minutes Intravenous  Once 03/22/13 2215 03/28/13 1116   03/22/13 2230  vancomycin (VANCOCIN) 2,500 mg in sodium chloride 0.9 % 500 mL IVPB     2,500 mg 250 mL/hr over 120 Minutes Intravenous  Once 03/22/13 2215 03/23/13 0142   03/22/13 2145  vancomycin (VANCOCIN) 15 mg/kg in sodium chloride 0.9 % 100 mL IVPB  Status:  Discontinued      15 mg/kg 100 mL/hr over 60 Minutes Intravenous  Once 03/22/13 2142 03/22/13 2216      Assessment: 37 y.o. M who continues on Vancomycin + Zosyn for empiric coverage of B/L foot wounds s/p R foot ray amputation and L great toe amputation at the MTP joint on 10/17. Doses remain appropriate at this time. Holding off on checking Vancomycin trough for now as plans per ortho are to discharge home on oral doxycycline x 2 weeks  Goal of Therapy:  Vancomycin trough level 15-20 mcg/ml Proper antibiotics for infection/cultures adjusted for renal/hepatic function   Plan:  1. Continue Vancomycin 1g IV every 12 hours 2. Zosyn 3.375g IV every 8 hours (infused over 4 hours) 3. Will continue to follow renal function, culture results, LOT, and antibiotic de-escalation plans   Georgina Pillion, PharmD, BCPS Clinical Pharmacist Pager: [phone] 03/28/2013 1:15 PM

## 2013-03-28 NOTE — Progress Notes (Signed)
Patient discharge to home with family at 57.  Discharge instruction provided by RN, patient verbalized understanding, no further questions ask.  Dressing changed prior to discharge to bilateral feet with 4x4, kerlex, and ace wrap.  Belonging packed, patient escorted off unit by NT.

## 2013-03-28 NOTE — Progress Notes (Signed)
   CARE MANAGEMENT NOTE 03/28/2013  Patient:  [name], [name]   Account Number:  [id number]  Date Initiated:  03/24/2013  Documentation initiated by:  ROYAL,CHERYL  Subjective/Objective Assessment:   Consult for [facility] needs, not orders yet.     Action/Plan:   Following for identifed HH needs and orders.   Anticipated DC Date:  03/28/2013   Anticipated DC Plan:  HOME W HOME HEALTH SERVICES  In-house referral  Clinical Social Worker      DC Planning Services  CM consult      [facility] Choice  HOME HEALTH   Choice offered to / List presented to:     DME arranged  WHEELCHAIR - MANUAL      DME agency  [facility]     HH arranged  HH-2 PT      [facility] agency  [facility]   Status of service:  Completed, signed off Medicare Important Message given?   (If response is "NO", the following Medicare IM given date fields will be blank) Date Medicare IM given:   Date Additional Medicare IM given:    Discharge Disposition:  HOME W HOME HEALTH SERVICES  Per UR Regulation:    If discussed at Long Length of Stay Meetings, dates discussed:    Comments:  03/28/2013  [address] Russian RN, [place]  [phone] CM referral:  home health PT DME:  wheelchair/cuschion, ELR, RW, 3:1  Spoke with patient regarding home health servies upon discharge , NCM will contact [facility] with referral for services. He has a ramp at home.  Discussed DME needs: he has a 3:1 at home, NCM will order wheelchair, ELR, cushion. PT recommends RW, advised patient Medicaid would not cover RW,  advised cost $54, patient declined, will get one at home. [facility]/ Corrie Dandy called with referral for HHPT [facility]/ Darian called with referral for wheelchair  03/26/13 16:15 CM spoke to pt in room and pt states he will be returning to his mother's address upon discharge.  CM asked if he would be at [address], [place], [place] [address] for home health services.  Pt has Medicaid for insurance and choice was  offered.  Pt chooses [facility] to deliver Home Health services.  CM will fax referral once home health orders are written.  Will continue to follow for discharge needs. [name], BSN, CM [phone].

## 2013-03-28 NOTE — Evaluation (Signed)
Occupational Therapy Evaluation Patient Details Name: [name] MRN: [id number] DOB: 10-03-1975 Today's Date: 03/28/2013 Time: 7829-5621 OT Time Calculation (min): 15 min  OT Assessment / Plan / Recommendation History of present illness Pt is a 37 y.o. Male adm with bil great toe osteomyelitis. PMH includes DM, HTN, and vascular disease.     Clinical Impression   Pt at mod A level with LB ADLs and set up with UB ADLs. Pt able to transfer with min A and has B post op shoes. Pt will have 24 hr assist and sup at home and no further acute OT services indicated at this time    OT Assessment  Patient does not need any further OT services    Follow Up Recommendations  No OT follow up;Supervision/Assistance - 24 hour;Supervision - Intermittent    Barriers to Discharge  None    Equipment Recommendations  None recommended by OT    Recommendations for Other Services    Frequency       Precautions / Restrictions Precautions Precautions: None Required Braces or Orthoses: Other Brace/Splint Other Brace/Splint: bil post op shoes Restrictions Weight Bearing Restrictions: Yes RLE Weight Bearing: Non weight bearing LLE Weight Bearing: Non weight bearing Other Position/Activity Restrictions: may weight bear through heels for transfers   Pertinent Vitals/Pain 2/10    ADL  Grooming: Performed;Wash/dry hands;Wash/dry face;Set up Where Assessed - Grooming: Unsupported sitting Upper Body Bathing: Simulated;Set up Lower Body Bathing: Simulated;Moderate assistance Upper Body Dressing: Performed;Set up Lower Body Dressing: Performed;Moderate assistance Toilet Transfer: Simulated;Minimal assistance Toilet Transfer Method: Sit to stand Toileting - Clothing Manipulation and Hygiene: Performed;Moderate assistance Tub/Shower Transfer Method: Not assessed Equipment Used: Gait belt;Rolling walker;Other (comment) (B post op shoes) Transfers/Ambulation Related to ADLs: pt allowed to bear weight  through heels for transfers, pt with difficulty initially powering up to stand through bil Heels and UEs; cues for hand placement and sequencing with RW: pt required incr time due to pain ADL Comments: Pt requires mod level of A with LB ADLs due to B LE weight bearing restrictions, will have assist fro spouse/family at home    OT Diagnosis:    OT Problem List:   OT Treatment Interventions:     OT Goals(Current goals can be found in the care plan section) Acute Rehab OT Goals Patient Stated Goal: to get home soon  Visit Information  Last OT Received On: 03/28/13 Assistance Needed: +1 History of Present Illness: Pt is a 37 y.o. Male adm with bil great toe osteomyelitis. PMH includes DM, HTN, and vascular disease.         Prior Functioning     Home Living Family/patient expects to be discharged to:: Private residence Living Arrangements: Other relatives;Parent Available Help at Discharge: Family;Available 24 hours/day Type of Home: House Home Access: Stairs to enter [employer] of Steps: 3 Home Layout: One level Home Equipment: None Prior Function Level of Independence: Independent Communication Communication: No difficulties Dominant Hand: Left         Vision/Perception Vision - History Baseline Vision: No visual deficits Patient Visual Report: No change from baseline Perception Perception: Within Functional Limits   Cognition  Cognition Arousal/Alertness: Awake/alert Behavior During Therapy: WFL for tasks assessed/performed Overall Cognitive Status: Within Functional Limits for tasks assessed    Extremity/Trunk Assessment Upper Extremity Assessment Upper Extremity Assessment: Overall WFL for tasks assessed Lower Extremity Assessment Lower Extremity Assessment: Defer to PT evaluation Cervical / Trunk Assessment Cervical / Trunk Assessment: Normal     Mobility Bed Mobility Bed  Mobility: Supine to Sit;Sitting - Scoot to Edge of Bed Supine to Sit:  6: Modified independent (Device/Increase time) Sitting - Scoot to Edge of Bed: 6: Modified independent (Device/Increase time) Sit to Supine: 6: Modified independent (Device/Increase time) Details for Bed Mobility Assistance: pt demo good technique and ability to perform bed mobilty without physical (A)  Transfers Transfers: Sit to Stand;Stand to Sit Sit to Stand: 4: Min assist;From bed Stand to Sit: To chair/3-in-1;With armrests;4: Min guard;4: Min assist Details for Transfer Assistance: pt with difficulty initially powering up to stand through bil Heels and UEs; cues for hand placement and sequencing with RW: pt required incr time due to pain     Exercise     Balance Balance Balance Assessed: Yes Static Sitting Balance Static Sitting - Balance Support: Bilateral upper extremity supported;Feet unsupported Static Sitting - Level of Assistance: 7: Independent Static Standing Balance Static Standing - Balance Support: Bilateral upper extremity supported;During functional activity Static Standing - Level of Assistance: 5: Stand by assistance   End of Session OT - End of Session Equipment Utilized During Treatment: Gait belt;Rolling walker Activity Tolerance: Patient tolerated treatment well Patient left: in chair;with family/visitor present;with call bell/phone within reach  GO     Galen Manila 03/28/2013, 1:17 PM

## 2013-03-28 NOTE — Discharge Summary (Signed)
Physician Discharge Summary  [name] WUJ:[id number] DOB: [birth year] DOA: 03/22/2013  PCP: Dorrene German, MD  Admit date: 03/22/2013 Discharge date: 03/28/2013  Time spent: Greater than 30 minutes  Recommendations for Outpatient Follow-up:  1. Dr. Aldean Baker, Orthopedics on 04/01/13 at 10 AM. 2. Dr. Fleet Contras, PCP on 04/06/13 with repeat labs (CBC & BMP). 3. Home Health PT, Wheelchair (measurements PT);Wheelchair cushion (measurements PT); 3in1 (PT);Rolling walker with 5" wheels  4. No need of a dressing change of bilateral feet until followup with Dr. Lajoyce Corners on 10/24. 5. Follow final blood culture results that were sent from the hospital on 03/22/13 6. Consider outpatient evaluation and management of anemia as deemed necessary.   Discharge Diagnoses:  Principal Problem:   Diabetic osteomyelitis b/l toes Active Problems:   Diabetic foot ulcers   AKI (acute kidney injury)   DM (diabetes mellitus) type II uncontrolled, periph vascular disorder   Renal failure, acute on chronic   Anemia   Essential hypertension, benign   Discharge Condition: Improved & Stable  Diet recommendation: Heart healthy and diabetic diet.  Filed Weights   03/23/13 0102  Weight: 107.502 kg (237 lb)    History of present illness:  37 year old male patient with history of DM 2/IDDM complicated by PAD, possible peripheral neuropathy, HTN, prior history of osteomyelitis & left toe amputations presented to the ED on 03/22/13 due to fevers, draining wounds from both feet.    Hospital Course:   S/P Amputation of right foot 1st & 2nd ray & Left great toe at MTP on 10/17 for Osteomyelitis & abscess  - Orthopedic was consulted and after couple days of IV antibiotic coverage, patient underwent above surgery.  - Postoperatively, patient was continued on IV antibiotics for approximately 48 hours. - Dr. Lajoyce Corners has seen the patient today and cleared him for discharge. - Discussed with Dr. Lajoyce Corners who  recommends additional 2 weeks of oral doxycycline, weight bearing on heels bilaterally, no change in dressing until OP follow up with him on 10/24 (patient has appointment) - Home health services arranged as per PT and OT recommendations.  Type II DM with possible peripheral neuropathy and PAD  - Patient states that he has been off all antidiabetic medications for approximately a year. He states that he checks CBGs twice daily at home with highest reading being 120 mg per DL.  - Continue SSI. Good inpatient control. Has required minimal insulins. - Diet control as outpatient. - Hemoglobin A1c: 6.9.  Acute on possible stage II chronic kidney disease from diabetic nephropathy  - Acute renal failure precipitated by ACE inhibitors.  - Held ACE inhibitors and treated with gentle IV fluid hydration. Acute renal failure has resolved and patient has probably achieved a new baseline creatinine of 1.5-1.6. - Baseline creatinine probably in the 1.3-1.4 range, PTA.  - Consider outpatient nephrology consultation and followup. - Continue to hold ACE inhibitors due to recent episode of acute renal failure, until followup with PCP in a week's time.  Anemia  - Probably from chronic disease and chronic kidney disease  - No active bleeding.  - Stable. Follow CBC during outpatient PCP visit  Hypertension  - Controlled despite holding lisinopril.    Consultants:  Orthopedics   Procedures:   S/P Amputation of right foot 1st & 2nd ray & Left great toe at [facility] on 10/17      Discharge Exam:  Complaints:  Mild pain in both feet.  Filed Vitals:   03/27/13 2132 03/28/13 0550 03/28/13 0917 03/28/13  1333  BP: 112/66 118/74 124/77 117/71  Pulse: 86 75 86 86  Temp: 100.3 F (37.9 C) 98.2 F (36.8 C) 98.6 F (37 C) 98.3 F (36.8 C)  TempSrc: Oral Oral    Resp: 18 18 18 18   Height:      Weight:      SpO2: 100% 99% 98% 100%    General exam: Moderately built and overweight male patient lying  comfortably supine in bed  Respiratory system: Clear. No increased work of breathing.  Cardiovascular system: S1 & S2 heard, RRR. No JVD, murmurs, gallops, clicks or pedal edema.  Gastrointestinal system: Abdomen is nondistended, soft and nontender. Normal bowel sounds heard.  Central nervous system: Alert and oriented. No focal neurological deficits.  Extremities: Symmetric 5 x 5 power. Bilateral feet dressings, clean and dry.   Discharge Instructions      Discharge Orders   Future Orders Complete By Expires   Call MD for:  difficulty breathing, headache or visual disturbances  As directed    Call MD for:  extreme fatigue  As directed    Call MD for:  persistant dizziness or light-headedness  As directed    Call MD for:  persistant nausea and vomiting  As directed    Call MD for:  redness, tenderness, or signs of infection (pain, swelling, redness, odor or green/yellow discharge around incision site)  As directed    Call MD for:  severe uncontrolled pain  As directed    Call MD for:  temperature >100.4  As directed    Diet - low sodium heart healthy  As directed    Diet Carb Modified  As directed    Discharge instructions  As directed    Comments:     Weightbearing on the both heels   Increase activity slowly  As directed    Weight bearing as tolerated  As directed    Scheduling Instructions:     Weightbearing on bilateral heels for transfer training only. No gait training.   Questions:     Laterality:  bilateral   Extremity:  Lower       Medication List    STOP taking these medications       lisinopril 10 MG tablet  Commonly known as:  PRINIVIL,ZESTRIL      TAKE these medications       doxycycline 100 MG tablet  Commonly known as:  VIBRA-TABS  Take 1 tablet (100 mg total) by mouth 2 (two) times daily.     oxyCODONE-acetaminophen 5-325 MG per tablet  Commonly known as:  PERCOCET/ROXICET  Take 1 tablet by mouth every 6 (six) hours as needed for pain.        Follow-up Information   Follow up with Nadara Mustard, MD On 04/01/2013. (at 10 AM)    Specialty:  Orthopedic Surgery   Contact information:   [address] [place] [address] [place] [address] [phone]       Follow up with Dorrene German, MD On 04/06/2013. (At 2:30 PM. To be seen with repeat labs (CBC & BMP).)    Specialty:  Internal Medicine   Contact information:   [address] [place] [address] [phone]        The results of significant diagnostics from this hospitalization (including imaging, microbiology, ancillary and laboratory) are listed below for reference.    Significant Diagnostic Studies: Dg Foot Complete Left  03/22/2013   CLINICAL DATA:  Foot infection. Wound posterior great toe.  EXAM: LEFT FOOT - COMPLETE  3+ VIEW  COMPARISON:  01/28/2013  FINDINGS: Interphalangeal amputation of the great toe. Complete digital amputations of the 2nd and 3rd digits.  Ulceration of the great toe stump. High-density material on the stump is likely appointment superficially. No osseous erosion. No new joint narrowing.  The remainder of the foot shows no acute findings, such as fracture or evidence of osseous infection. Degenerative changes most notable at the 1st MTP joint and along the dorsal talar neck.  IMPRESSION: Ulcer on the great toe stump. No evidence of osteomyelitis.   Electronically Signed   By: Tiburcio Pea [name]   On: 03/22/2013 22:34   Dg Foot Complete Right  03/22/2013   CLINICAL DATA:  Osteomyelitis with infection. Diabetes.  EXAM: RIGHT FOOT COMPLETE - 3+ VIEW  COMPARISON:  03/08/2013  FINDINGS: Soft tissue wound re- demonstrated with radiodense material in the web space, possibly overlying packing material. Vascular calcification. Cellulitic change but no definite erosive change to suggest osteomyelitis. If no contraindications, MRI is more sensitive.  IMPRESSION: Cellulitis without definite signs of osteomyelitis. Vascular calcification. Similar appearance to  priors.   Electronically Signed   By: Davonna Belling [name]   On: 03/22/2013 22:39   Dg Foot Complete Right  03/08/2013   *RADIOLOGY REPORT*  Clinical Data: Osteomyelitis suspected with open wound of plantar surface right foot in the area of the first and second metatarsal phalangeal joints  RIGHT FOOT COMPLETE - 3+ VIEW  Comparison: None.  Findings: Lucency in the plantaris soft tissues over the first and second metatarsal head consistent with wound.  Wrapping material causes foreign body artifact.  No evidence of periosteal reaction or cortical destruction identified to indicate osteomyelitis definitively.  No osseous abnormalities in the region of the first or second metatarsal phalangeal joints.  There is a tiny approximately 2 mm cortical well defined lucency along the medial surface of the proximal phalanx just proximal to the interphalangeal joint, well away from the soft tissue wound.  This could relate to the anatomic variant or prior infection but does not appear consistent with acute osteomyelitis.  IMPRESSION: Soft tissue abnormalities consistent with clinical history.  No evidence of acute osteomyelitis.   Original Report Authenticated By: Esperanza Heir, [name]    Microbiology: Recent Results (from the past 240 hour(s))  CULTURE, BLOOD (ROUTINE X 2)     Status: None   Collection Time    03/22/13 10:39 PM      Result Value Range Status   Specimen Description BLOOD RIGHT ARM   Final   Special Requests BOTTLES DRAWN AEROBIC AND ANAEROBIC   Final   Culture  Setup Time     Final   Value: 03/23/2013 04:37     Performed at [facility]   Culture     Final   Value:        BLOOD CULTURE RECEIVED NO GROWTH TO DATE CULTURE WILL BE HELD FOR 5 DAYS BEFORE ISSUING A FINAL NEGATIVE REPORT     Performed at [facility]   Report Status PENDING   Incomplete  CULTURE, BLOOD (ROUTINE X 2)     Status: None   Collection Time    03/22/13 10:40 PM      Result Value Range Status    Specimen Description BLOOD LEFT ARM   Final   Special Requests BOTTLES DRAWN AEROBIC AND ANAEROBIC 10CC EACH   Final   Culture  Setup Time     Final   Value: 03/23/2013 04:37  Performed at [facility]     Final   Value:        BLOOD CULTURE RECEIVED NO GROWTH TO DATE CULTURE WILL BE HELD FOR 5 DAYS BEFORE ISSUING A FINAL NEGATIVE REPORT     Performed at [facility]   Report Status PENDING   Incomplete  SURGICAL PCR SCREEN     Status: None   Collection Time    03/24/13 11:41 PM      Result Value Range Status   MRSA, PCR NEGATIVE  NEGATIVE Final   Staphylococcus aureus NEGATIVE  NEGATIVE Final   Comment:            The Xpert SA Assay (FDA     approved for NASAL specimens     in patients over 38 years of age),     is one component of     a comprehensive surveillance     program.  Test performance has     been validated by [employer] for patients greater     than or equal to 32 year old.     It is not intended     to diagnose infection nor to     guide or monitor treatment.     Labs: Basic Metabolic Panel:  Recent Labs Lab 03/22/13 1848 03/23/13 0539 03/24/13 0440 03/25/13 0553 03/26/13 0544  NA 138 140 137 138 135  K 4.1 4.2 4.1 4.2 4.1  CL 101 106 103 106 100  CO2 26 27 27 23 26   GLUCOSE 166* 119* 89 110* 144*  BUN 27* 25* 18 17 14   CREATININE 2.11* 1.82* 1.58* 1.62* 1.56*  CALCIUM 8.8 8.1* 8.4 8.6 8.6   Liver Function Tests:  Recent Labs Lab 03/22/13 1848  AST 20  ALT 27  ALKPHOS 74  BILITOT 0.3  PROT 8.1  ALBUMIN 3.0*   No results found for this basename: LIPASE, AMYLASE,  in the last 168 hours No results found for this basename: AMMONIA,  in the last 168 hours CBC:  Recent Labs Lab 03/22/13 1848  03/24/13 0440 03/25/13 0553 03/26/13 0544 03/27/13 0405 03/28/13 0525  WBC 11.5*  < > 6.7 7.0 8.7 10.4 10.4  NEUTROABS 8.2*  --   --   --   --   --   --   HGB 11.2*  < > 8.9* 9.6* 8.5* 8.2* 8.1*  HCT 31.8*  < >  26.6* 28.2* 24.6* 23.6* 23.5*  MCV 89.1  < > 90.5 87.6 87.5 88.4 87.7  PLT 191  < > 172 196 207 229 266  < > = values in this interval not displayed. Cardiac Enzymes: No results found for this basename: CKTOTAL, CKMB, CKMBINDEX, TROPONINI,  in the last 168 hours BNP: BNP (last 3 results) No results found for this basename: PROBNP,  in the last 8760 hours CBG:  Recent Labs Lab 03/27/13 1223 03/27/13 1715 03/27/13 2137 03/28/13 0737 03/28/13 1113  GLUCAP 156* 106* 133* 126* 204*     Signed:  Marcellus Scott, MD, FACP, FHM. [facility] Pager [phone]  If 7PM-7AM, please contact night-coverage [email or web address] Password [email or web address] 03/28/2013, 1:55 PM

## 2013-03-29 LAB — CULTURE, BLOOD (ROUTINE X 2): Culture: NO GROWTH

## 2013-05-12 ENCOUNTER — Inpatient Hospital Stay (HOSPITAL_COMMUNITY)
Admission: EM | Admit: 2013-05-12 | Discharge: 2013-05-17 | DRG: 638 | Disposition: A | Discharge status: Home / Self Care | Payer: Medicaid Other | Attending: Internal Medicine | Admitting: Internal Medicine

## 2013-05-12 ENCOUNTER — Encounter (HOSPITAL_COMMUNITY): Payer: Self-pay | Admitting: Emergency Medicine

## 2013-05-12 ENCOUNTER — Emergency Department (HOSPITAL_COMMUNITY): Payer: Medicaid Other

## 2013-05-12 ENCOUNTER — Emergency Department (INDEPENDENT_AMBULATORY_CARE_PROVIDER_SITE_OTHER)
Admission: EM | Admit: 2013-05-12 | Discharge: 2013-05-12 | Disposition: A | Discharge status: Hospice | Payer: Medicaid Other | Source: Home / Self Care | Attending: Family Medicine | Admitting: Family Medicine

## 2013-05-12 DIAGNOSIS — M869 Osteomyelitis, unspecified: Secondary | ICD-10-CM

## 2013-05-12 DIAGNOSIS — N182 Chronic kidney disease, stage 2 (mild): Secondary | ICD-10-CM | POA: Diagnosis present

## 2013-05-12 DIAGNOSIS — E11621 Type 2 diabetes mellitus with foot ulcer: Secondary | ICD-10-CM | POA: Insufficient documentation

## 2013-05-12 DIAGNOSIS — M86179 Other acute osteomyelitis, unspecified ankle and foot: Secondary | ICD-10-CM

## 2013-05-12 DIAGNOSIS — A084 Viral intestinal infection, unspecified: Secondary | ICD-10-CM

## 2013-05-12 DIAGNOSIS — A4101 Sepsis due to Methicillin susceptible Staphylococcus aureus: Secondary | ICD-10-CM

## 2013-05-12 DIAGNOSIS — D631 Anemia in chronic kidney disease: Secondary | ICD-10-CM | POA: Diagnosis present

## 2013-05-12 DIAGNOSIS — L97509 Non-pressure chronic ulcer of other part of unspecified foot with unspecified severity: Secondary | ICD-10-CM

## 2013-05-12 DIAGNOSIS — E1169 Type 2 diabetes mellitus with other specified complication: Secondary | ICD-10-CM

## 2013-05-12 DIAGNOSIS — R112 Nausea with vomiting, unspecified: Secondary | ICD-10-CM | POA: Diagnosis present

## 2013-05-12 DIAGNOSIS — E1151 Type 2 diabetes mellitus with diabetic peripheral angiopathy without gangrene: Secondary | ICD-10-CM

## 2013-05-12 DIAGNOSIS — E1165 Type 2 diabetes mellitus with hyperglycemia: Principal | ICD-10-CM | POA: Diagnosis present

## 2013-05-12 DIAGNOSIS — N179 Acute kidney failure, unspecified: Secondary | ICD-10-CM | POA: Diagnosis present

## 2013-05-12 DIAGNOSIS — M9080 Osteopathy in diseases classified elsewhere, unspecified site: Secondary | ICD-10-CM | POA: Diagnosis present

## 2013-05-12 DIAGNOSIS — B351 Tinea unguium: Secondary | ICD-10-CM

## 2013-05-12 DIAGNOSIS — D649 Anemia, unspecified: Secondary | ICD-10-CM

## 2013-05-12 DIAGNOSIS — IMO0002 Reserved for concepts with insufficient information to code with codable children: Principal | ICD-10-CM | POA: Diagnosis present

## 2013-05-12 DIAGNOSIS — S98139A Complete traumatic amputation of one unspecified lesser toe, initial encounter: Secondary | ICD-10-CM

## 2013-05-12 DIAGNOSIS — K859 Acute pancreatitis without necrosis or infection, unspecified: Secondary | ICD-10-CM

## 2013-05-12 DIAGNOSIS — Z794 Long term (current) use of insulin: Secondary | ICD-10-CM

## 2013-05-12 DIAGNOSIS — I129 Hypertensive chronic kidney disease with stage 1 through stage 4 chronic kidney disease, or unspecified chronic kidney disease: Secondary | ICD-10-CM | POA: Diagnosis present

## 2013-05-12 DIAGNOSIS — E119 Type 2 diabetes mellitus without complications: Secondary | ICD-10-CM

## 2013-05-12 DIAGNOSIS — F17200 Nicotine dependence, unspecified, uncomplicated: Secondary | ICD-10-CM | POA: Diagnosis present

## 2013-05-12 DIAGNOSIS — Z993 Dependence on wheelchair: Secondary | ICD-10-CM

## 2013-05-12 DIAGNOSIS — I10 Essential (primary) hypertension: Secondary | ICD-10-CM

## 2013-05-12 HISTORY — DX: Type 2 diabetes mellitus with foot ulcer: L97.509

## 2013-05-12 HISTORY — DX: Type 2 diabetes mellitus with foot ulcer: E11.621

## 2013-05-12 LAB — COMPREHENSIVE METABOLIC PANEL
ALT: 5 U/L (ref 0–53)
AST: 5 U/L (ref 0–37)
Albumin: 3.5 g/dL (ref 3.5–5.2)
Alkaline Phosphatase: 77 U/L (ref 39–117)
BUN: 19 mg/dL (ref 6–23)
CO2: 29 mEq/L (ref 19–32)
Calcium: 9.4 mg/dL (ref 8.4–10.5)
Chloride: 101 mEq/L (ref 96–112)
Creatinine, Ser: 1.54 mg/dL — ABNORMAL HIGH (ref 0.50–1.35)
GFR calc Af Amer: 66 mL/min — ABNORMAL LOW (ref >90)
GFR calc non Af Amer: 57 mL/min — ABNORMAL LOW (ref >90)
Glucose, Bld: 148 mg/dL — ABNORMAL HIGH (ref 70–99)
Potassium: 4.3 mEq/L (ref 3.5–5.1)
Sodium: 138 mEq/L (ref 135–145)
Total Bilirubin: 0.2 mg/dL — ABNORMAL LOW (ref 0.3–1.2)
Total Protein: 8.4 g/dL — ABNORMAL HIGH (ref 6.0–8.3)

## 2013-05-12 LAB — CBC
HCT: 35.2 % — ABNORMAL LOW (ref 39.0–52.0)
Hemoglobin: 12 g/dL — ABNORMAL LOW (ref 13.0–17.0)
MCH: 31.1 pg (ref 26.0–34.0)
MCHC: 34.1 g/dL (ref 30.0–36.0)
MCV: 91.2 fL (ref 78.0–100.0)
Platelets: 243 10*3/uL (ref 150–400)
RBC: 3.86 MIL/uL — ABNORMAL LOW (ref 4.22–5.81)
RDW: 14.1 % (ref 11.5–15.5)
WBC: 7.4 10*3/uL (ref 4.0–10.5)

## 2013-05-12 LAB — GLUCOSE, CAPILLARY
Glucose-Capillary: 131 mg/dL — ABNORMAL HIGH (ref 70–99)
Glucose-Capillary: 175 mg/dL — ABNORMAL HIGH (ref 70–99)
Glucose-Capillary: 198 mg/dL — ABNORMAL HIGH (ref 70–99)

## 2013-05-12 LAB — CG4 I-STAT (LACTIC ACID): Lactic Acid, Venous: 1.43 mmol/L (ref 0.5–2.2)

## 2013-05-12 MED ORDER — MORPHINE SULFATE 2 MG/ML IJ SOLN
1.0000 mg | INTRAMUSCULAR | Status: DC | PRN
  Administered 2013-05-12 – 2013-05-16 (×14): 1 mg via INTRAVENOUS
  Filled 2013-05-12 (×14): qty 1

## 2013-05-12 MED ORDER — CIPROFLOXACIN IN D5W 400 MG/200ML IV SOLN
400.0000 mg | Freq: Two times a day (BID) | INTRAVENOUS | Status: DC
  Administered 2013-05-12 – 2013-05-17 (×10): 400 mg via INTRAVENOUS
  Filled 2013-05-12 (×11): qty 200

## 2013-05-12 MED ORDER — MORPHINE SULFATE 4 MG/ML IJ SOLN
4.0000 mg | Freq: Once | INTRAMUSCULAR | Status: AC
  Administered 2013-05-12: 4 mg via INTRAVENOUS
  Filled 2013-05-12: qty 1

## 2013-05-12 MED ORDER — ONDANSETRON HCL 4 MG PO TABS: 4.0000 mg | ORAL_TABLET | Freq: Four times a day (QID) | ORAL | Status: DC | PRN

## 2013-05-12 MED ORDER — VANCOMYCIN HCL IN DEXTROSE 1-5 GM/200ML-% IV SOLN
1000.0000 mg | Freq: Once | INTRAVENOUS | Status: AC
  Administered 2013-05-12: 1000 mg via INTRAVENOUS
  Filled 2013-05-12: qty 200

## 2013-05-12 MED ORDER — ACETAMINOPHEN 650 MG RE SUPP: 650.0000 mg | Freq: Four times a day (QID) | RECTAL | Status: DC | PRN

## 2013-05-12 MED ORDER — ONDANSETRON HCL 4 MG/2ML IJ SOLN: 4.0000 mg | Freq: Four times a day (QID) | INTRAMUSCULAR | Status: DC | PRN

## 2013-05-12 MED ORDER — ACETAMINOPHEN 325 MG PO TABS
650.0000 mg | ORAL_TABLET | Freq: Four times a day (QID) | ORAL | Status: DC | PRN
  Administered 2013-05-14 – 2013-05-15 (×4): 650 mg via ORAL
  Filled 2013-05-12 (×4): qty 2

## 2013-05-12 MED ORDER — VANCOMYCIN HCL IN DEXTROSE 1-5 GM/200ML-% IV SOLN
1000.0000 mg | Freq: Three times a day (TID) | INTRAVENOUS | Status: DC
  Administered 2013-05-12 – 2013-05-14 (×5): 1000 mg via INTRAVENOUS
  Filled 2013-05-12 (×8): qty 200

## 2013-05-12 MED ORDER — INSULIN ASPART 100 UNIT/ML ~~LOC~~ SOLN
0.0000 [IU] | Freq: Three times a day (TID) | SUBCUTANEOUS | Status: DC
  Administered 2013-05-13 (×2): 2 [IU] via SUBCUTANEOUS

## 2013-05-12 MED ORDER — PIPERACILLIN-TAZOBACTAM 3.375 G IVPB
3.3750 g | Freq: Once | INTRAVENOUS | Status: AC
  Administered 2013-05-12: 3.375 g via INTRAVENOUS
  Filled 2013-05-12: qty 50

## 2013-05-12 MED ORDER — SODIUM CHLORIDE 0.9 % IV SOLN: INTRAVENOUS | Status: AC

## 2013-05-12 NOTE — ED Notes (Signed)
Dr Diamantina Providence answering service notified that pt is roomed.  [phone].

## 2013-05-12 NOTE — ED Notes (Signed)
Pt is diabetic and had partial amputations to bilateral feet here several months ago. Returned to ucc today for eval because he has noticed increased swelling and pain to his feet in the past few days. They sent pt to ed for further workup. Pt states his blood sugar has been normal at home. Pt was told when he went home from the hospital after his foot surgery to stop taking all of his medications and so he has been off his blood pressure and other meds.

## 2013-05-12 NOTE — ED Notes (Signed)
C/o feet swelling for two days now States he has had surgery two months ago on feet. Patient has rest feet and elevated.

## 2013-05-12 NOTE — ED Provider Notes (Signed)
CSN: [id number]     Arrival date & time 05/12/13  1615 History   First MD Initiated Contact with Patient 05/12/13 1650     Chief Complaint  Patient presents with  . Foot Swelling   (Consider location/radiation/quality/duration/timing/severity/associated sxs/prior Treatment) Patient is a 36 y.o. male presenting with lower extremity pain. The history is provided by the patient.  Foot Pain This is a new problem. The current episode started 2 days ago. The problem has been gradually worsening.    Past Medical History  Diagnosis Date  . Hypertension   . Diabetes mellitus     type 2 iddm x 18 yrs  . Vascular disease     poor circulation to left foot  . Acute osteomyelitis, ankle and foot 07/30/2010    Qualifier: Diagnosis of  By: Daiva Eves MD, Remi Haggard     Past Surgical History  Procedure Laterality Date  . Cholecystectomy    . Toe amputation  2012    left foot; great toe and second toe  . Amputation  04/15/2011    Procedure: AMPUTATION RAY;  Surgeon: Nadara Mustard, MD;  Location: [facility] OR;  Service: Orthopedics;  Laterality: Left;  left foot third toe amputation and MPP joint and gastroc resection VS. achilles lengthing   . Amputation  04/25/2011    Procedure: AMPUTATION DIGIT;  Surgeon: Nadara Mustard, MD;  Location: [facility] OR;  Service: Orthopedics;  Laterality: Left;  Left foot 3rd toe amputation MTP joint, Gastroc Recession  Achilles Lengthening   . Amputation Bilateral 03/25/2013    Procedure: AMPUTATION RAY;  Surgeon: Nadara Mustard, MD;  Location: [facility] OR;  Service: Orthopedics;  Laterality: Bilateral;  Left Great Toe Amputation at  MTP Joint, Right 1st and 2nd Ray Amputation    History reviewed. No pertinent family history. History  Substance Use Topics  . Smoking status: Current Every Day Smoker -- 0.25 packs/day for 1 years    Types: Cigarettes  . Smokeless tobacco: Never Used  . Alcohol Use: No    Review of Systems  Constitutional: Negative for fever and chills.    Musculoskeletal: Positive for gait problem.  Skin: Positive for wound.    Allergies  Review of patient's allergies indicates no known allergies.  Home Medications   Current Outpatient Rx  Name  Route  Sig  Dispense  Refill  . doxycycline (VIBRA-TABS) 100 MG tablet   Oral   Take 1 tablet (100 mg total) by mouth 2 (two) times daily.   28 tablet   0   . oxyCODONE-acetaminophen (PERCOCET/ROXICET) 5-325 MG per tablet   Oral   Take 1 tablet by mouth every 6 (six) hours as needed for pain.   20 tablet   0    BP 191/94  Pulse 90  Temp(Src) 98 F (36.7 C) (Oral)  Resp 18  SpO2 98% Physical Exam  Nursing note and vitals reviewed. Constitutional: He is oriented to person, place, and time. He appears well-developed and well-nourished. He appears distressed.  Musculoskeletal: He exhibits edema and tenderness.       Right foot: He exhibits tenderness, bony tenderness and swelling.       Feet:  Neurological: He is alert and oriented to person, place, and time.  Skin: Skin is warm and dry.    ED Course  Procedures (including critical care time) Labs Review Labs Reviewed - No data to display Imaging Review No results found.  EKG Interpretation    Date/Time:    Ventricular Rate:  PR Interval:    QRS Duration:   QT Interval:    QTC Calculation:   R Axis:     Text Interpretation:              MDM      Linna Hoff, MD 05/12/13 1712

## 2013-05-12 NOTE — ED Provider Notes (Signed)
CSN: [id number]     Arrival date & time 05/12/13  1721 History   First MD Initiated Contact with Patient 05/12/13 1747     Chief Complaint  Patient presents with  . Foot Swelling   (Consider location/radiation/quality/duration/timing/severity/associated sxs/prior Treatment) HPI  37 year old male with history of insulin-dependent diabetes, vascular disease, and prior history of osteomyelitis presents for evaluation of foot pain and swelling. Patient reports he has had partial amputation of bilateral feet 2 months ago. Surgery was done by Dr. Lajoyce Corners, due to foot infection from uncontrolled diabetic. For the past 2 days he has had increased pain to both feet right worse than left. Pain is described as sharp and stabbing, persistent starts from the toe and radiates to bilateral calf. Has tried taking Tylenol and elevate feet without relief. States his blood sugar is well-controlled. Otherwise patient denies fever, chills, nausea, vomiting, diarrhea.  No recent abx use.  Currently wheel chair bound.    Past Medical History  Diagnosis Date  . Hypertension   . Diabetes mellitus     type 2 iddm x 18 yrs  . Vascular disease     poor circulation to left foot  . Acute osteomyelitis, ankle and foot 07/30/2010    Qualifier: Diagnosis of  By: Daiva Eves MD, Remi Haggard     Past Surgical History  Procedure Laterality Date  . Cholecystectomy    . Toe amputation  2012    left foot; great toe and second toe  . Amputation  04/15/2011    Procedure: AMPUTATION RAY;  Surgeon: Nadara Mustard, MD;  Location: [facility] OR;  Service: Orthopedics;  Laterality: Left;  left foot third toe amputation and MPP joint and gastroc resection VS. achilles lengthing   . Amputation  04/25/2011    Procedure: AMPUTATION DIGIT;  Surgeon: Nadara Mustard, MD;  Location: [facility] OR;  Service: Orthopedics;  Laterality: Left;  Left foot 3rd toe amputation MTP joint, Gastroc Recession  Achilles Lengthening   . Amputation Bilateral 03/25/2013   Procedure: AMPUTATION RAY;  Surgeon: Nadara Mustard, MD;  Location: [facility] OR;  Service: Orthopedics;  Laterality: Bilateral;  Left Great Toe Amputation at  MTP Joint, Right 1st and 2nd Ray Amputation    History reviewed. No pertinent family history. History  Substance Use Topics  . Smoking status: Current Every Day Smoker -- 0.25 packs/day for 1 years    Types: Cigarettes  . Smokeless tobacco: Never Used  . Alcohol Use: No    Review of Systems  All other systems reviewed and are negative.    Allergies  Review of patient's allergies indicates no known allergies.  Home Medications   Current Outpatient Rx  Name  Route  Sig  Dispense  Refill  . doxycycline (VIBRA-TABS) 100 MG tablet   Oral   Take 1 tablet (100 mg total) by mouth 2 (two) times daily.   28 tablet   0   . oxyCODONE-acetaminophen (PERCOCET/ROXICET) 5-325 MG per tablet   Oral   Take 1 tablet by mouth every 6 (six) hours as needed for pain.   20 tablet   0    BP 141/97  Pulse 83  Temp(Src) 97.7 F (36.5 C) (Oral)  Resp 16  SpO2 100% Physical Exam  Constitutional: He appears well-developed and well-nourished. No distress.  HENT:  Head: Atraumatic.  Eyes: Conjunctivae are normal.  Neck: Normal range of motion. Neck supple.  Cardiovascular: Normal rate and regular rhythm.   Pulmonary/Chest: Effort normal and breath sounds normal.  Abdominal:  Soft. There is no tenderness.  Musculoskeletal:  R foot: prior amputation of 1-3rd toes.  Foot moderately edematous with 2+ pitting edema.  Dry scaly skin with ulceration noted to stump of 1st toe at MCP.  A retain suture were noted, tenderness to palpation, foul smelling without obvious exudates.  No calf tenderness.  L foot: mild tenderness to stump of 1st-2nd toe without ulceration, erythema, or deformity.    Intact pedal pulses bilat.    Neurological: He is alert.  Skin: No rash noted.  Psychiatric: He has a normal mood and affect.    ED Course  Procedures  (including critical care time)  6:30 PM Pt with prior diabetic foot ulcer and prior toes amputations to both feet here with worsening pain and swelling to R foot, and some discomfort with L foot.  R foot with evidence concerning for worsening diabetic foot ulcer.  Will obtain xray to r/o osteo.  Will check basic labs, give pain medication and will start iv abx.    6:42 PM Care discussed with Dr. Juleen China who has also evaluate pt.  Plan to start broad spectrum abx including vanc/zosyn.  Orthopedist, Dr. August Saucer were notified by Urgent Care and is aware of pt being in the ER.  Recommend pt admitted to medicine, NPO after midnight and Dr. Lajoyce Corners to see pt tomorrow.  No prophylactic heparin.    8:19 PM Xray of R foot shows finding suspicious for osteo.  Will consult for admission.    8:34 PM i have consulted with [facility], Dr. Toniann Fail, who agrees to admit pt to team 10, regular floor, under his care.    Labs Review Labs Reviewed  COMPREHENSIVE METABOLIC PANEL - Abnormal; Notable for the following:    Glucose, Bld 148 (*)    Creatinine, Ser 1.54 (*)    Total Protein 8.4 (*)    Total Bilirubin 0.2 (*)    GFR calc non Af Amer 57 (*)    GFR calc Af Amer 66 (*)    All other components within normal limits  CBC - Abnormal; Notable for the following:    RBC 3.86 (*)    Hemoglobin 12.0 (*)    HCT 35.2 (*)    All other components within normal limits  GLUCOSE, CAPILLARY - Abnormal; Notable for the following:    Glucose-Capillary 131 (*)    All other components within normal limits  CG4 I-STAT (LACTIC ACID)   Imaging Review Dg Foot Complete Left  05/12/2013   CLINICAL DATA:  Diabetic ulcer  EXAM: LEFT FOOT - COMPLETE 3+ VIEW  COMPARISON:  Prior radiographs 03/22/2013  FINDINGS: Compared to prior, interval amputation of the proximal phalanx of the great toe. Irregularity of the distal phalanx of the little toe is similar compared to prior. No conventional radiographic evidence of  osteomyelitis. Soft tissue swelling is noted at the ball of the foot. No acute fracture or malalignment.  IMPRESSION: Soft tissue swelling at the ball of the foot. No conventional radiographic findings to suggest osteomyelitis.  Interval amputation of the proximal phalanx of the great toe compared to 03/22/2013.   Electronically Signed   By: Malachy Moan [name]   On: 05/12/2013 19:48   Dg Foot Complete Right  05/12/2013   CLINICAL DATA:  Diabetic foot ulcer.  Foot pain and swelling.  EXAM: RIGHT FOOT COMPLETE - 3+ VIEW  COMPARISON:  03/22/2013  FINDINGS: Patient has undergone interval amputation of the 1st and 2nd toes. Mild osteolysis in periosteal reaction is seen  at the surgical margin of the distal 2nd metatarsal, suspicious for osteomyelitis. No other sites of osteolysis or periostitis identified. Soft tissue swelling is seen involving the medial forefoot, without evidence of soft tissue gas. No evidence of fracture or dislocation.  IMPRESSION: Findings suspicious for osteomyelitis at the distal 2nd metatarsal amputation site.   Electronically Signed   By: Myles Rosenthal [name]   On: 05/12/2013 19:49    EKG Interpretation   None       MDM   1. Diabetic foot ulcer with osteomyelitis    BP 135/63  Pulse 84  Temp(Src) 98.5 F (36.9 C) (Oral)  Resp 16  SpO2 100%  I have reviewed nursing notes and vital signs. I personally reviewed the imaging tests through PACS system  I reviewed available ER/hospitalization records thought the EMR     Fayrene Helper, [place] 05/12/13 2035

## 2013-05-12 NOTE — H&P (Signed)
[facility] History and Physical  Jenesis Suchy WUJ:[id number] DOB: Feb 22, 1976 DOA: 05/12/2013  Referring physician: ER physician. PCP: Dorrene German, MD   Chief Complaint: Bilateral foot pain.  HPI: [name] is a 37 y.o. male who was admitted in October 2 months ago for osteomyelitis and had undergone amputation of the right foot first and second ray and left foot great toe secondary to abscess and osteomyelitis started experiencing swelling and pain of the both feet last 2 days. Patient also noticed mild discharge from the right foot surgical area. Denies any fever chills. X-rays show possible osteomyelitis of the right foot metatarsal area. On-call orthopedic surgeon was consulted by the ER physician and was advised to keep patient n.p.o. past midnight and Dr. Lajoyce Corners, patient's orthopedic surgeon will be seeing patient in a.m. Patient has been started with antibiotics and has been admitted for further management. Patient otherwise denies any chest pain or shortness of breath nausea vomiting abdominal pain diarrhea.   Review of Systems: As presented in the history of presenting illness, rest negative.  Past Medical History  Diagnosis Date  . Hypertension   . Diabetes mellitus     type 2 iddm x 18 yrs  . Vascular disease     poor circulation to left foot  . Acute osteomyelitis, ankle and foot 07/30/2010    Qualifier: Diagnosis of  By: Daiva Eves MD, Remi Haggard     Past Surgical History  Procedure Laterality Date  . Cholecystectomy    . Toe amputation  2012    left foot; great toe and second toe  . Amputation  04/15/2011    Procedure: AMPUTATION RAY;  Surgeon: Nadara Mustard, MD;  Location: [facility] OR;  Service: Orthopedics;  Laterality: Left;  left foot third toe amputation and MPP joint and gastroc resection VS. achilles lengthing   . Amputation  04/25/2011    Procedure: AMPUTATION DIGIT;  Surgeon: Nadara Mustard, MD;  Location: [facility] OR;  Service: Orthopedics;  Laterality: Left;  Left  foot 3rd toe amputation MTP joint, Gastroc Recession  Achilles Lengthening   . Amputation Bilateral 03/25/2013    Procedure: AMPUTATION RAY;  Surgeon: Nadara Mustard, MD;  Location: [facility] OR;  Service: Orthopedics;  Laterality: Bilateral;  Left Great Toe Amputation at  MTP Joint, Right 1st and 2nd Ray Amputation    Social History:  reports that he has been smoking Cigarettes.  He has a .25 pack-year smoking history. He has never used smokeless tobacco. He reports that he uses illicit drugs (Marijuana) about 10 times per week. He reports that he does not drink alcohol. Where does patient live home. Can patient participate in ADLs? Yes.  No Known Allergies  Family History: History reviewed. No pertinent family history.    Prior to Admission medications   Medication Sig Start Date End Date Taking? Authorizing Provider  acetaminophen (TYLENOL) 500 MG tablet Take 1,000 mg by mouth every 6 (six) hours as needed.   Yes Historical Provider, MD    Physical Exam: Filed Vitals:   05/12/13 1729 05/12/13 1906 05/12/13 2000  BP: 141/97 131/83 135/63  Pulse: 83 78 84  Temp: 97.7 F (36.5 C) 98.5 F (36.9 C)   TempSrc: Oral Oral   Resp: 16 16   SpO2: 100% 100% 100%     General:  Well-developed well-nourished.  Eyes: Anicteric no pallor.  ENT: No discharge from the ears eyes nose mouth.  Neck: No mass felt.  Cardiovascular: S1-S2 heard.  Respiratory: No rhonchi or crepitations.  Abdomen: Soft nontender bowel sounds present.  Skin: Patient has chronic skin changes in the lower extremities. There is mild discharge from the right foot surgical area of the right second ray.  Musculoskeletal: Swelling of the both feet but has good pulses.  Psychiatric: Appears normal.  Neurologic: Alert and oriented to time place and person. Moves all extremities.  Labs on Admission:  Basic Metabolic Panel:  Recent Labs Lab 05/12/13 1733  NA 138  K 4.3  CL 101  CO2 29  GLUCOSE 148*  BUN 19   CREATININE 1.54*  CALCIUM 9.4   Liver Function Tests:  Recent Labs Lab 05/12/13 1733  AST 5  ALT 5  ALKPHOS 77  BILITOT 0.2*  PROT 8.4*  ALBUMIN 3.5   No results found for this basename: LIPASE, AMYLASE,  in the last 168 hours No results found for this basename: AMMONIA,  in the last 168 hours CBC:  Recent Labs Lab 05/12/13 1733  WBC 7.4  HGB 12.0*  HCT 35.2*  MCV 91.2  PLT 243   Cardiac Enzymes: No results found for this basename: CKTOTAL, CKMB, CKMBINDEX, TROPONINI,  in the last 168 hours  BNP (last 3 results) No results found for this basename: PROBNP,  in the last 8760 hours CBG:  Recent Labs Lab 05/12/13 1732 05/12/13 2117  GLUCAP 131* 175*    Radiological Exams on Admission: Dg Foot Complete Left  05/12/2013   CLINICAL DATA:  Diabetic ulcer  EXAM: LEFT FOOT - COMPLETE 3+ VIEW  COMPARISON:  Prior radiographs 03/22/2013  FINDINGS: Compared to prior, interval amputation of the proximal phalanx of the great toe. Irregularity of the distal phalanx of the little toe is similar compared to prior. No conventional radiographic evidence of osteomyelitis. Soft tissue swelling is noted at the ball of the foot. No acute fracture or malalignment.  IMPRESSION: Soft tissue swelling at the ball of the foot. No conventional radiographic findings to suggest osteomyelitis.  Interval amputation of the proximal phalanx of the great toe compared to 03/22/2013.   Electronically Signed   By: Malachy Moan [name]   On: 05/12/2013 19:48   Dg Foot Complete Right  05/12/2013   CLINICAL DATA:  Diabetic foot ulcer.  Foot pain and swelling.  EXAM: RIGHT FOOT COMPLETE - 3+ VIEW  COMPARISON:  03/22/2013  FINDINGS: Patient has undergone interval amputation of the 1st and 2nd toes. Mild osteolysis in periosteal reaction is seen at the surgical margin of the distal 2nd metatarsal, suspicious for osteomyelitis. No other sites of osteolysis or periostitis identified. Soft tissue swelling is seen  involving the medial forefoot, without evidence of soft tissue gas. No evidence of fracture or dislocation.  IMPRESSION: Findings suspicious for osteomyelitis at the distal 2nd metatarsal amputation site.   Electronically Signed   By: Myles Rosenthal [name]   On: 05/12/2013 19:49     Assessment/Plan Principal Problem:   Osteomyelitis Active Problems:   Anemia   1. Possible osteomyelitis of the right lower extremity - patient has been placed on vancomycin and Cipro. Check sedimentation rate. Further recommendations per orthopedic surgeon. Patient has been kept n.p.o. past midnight in anticipation of possible procedure. Check Dopplers of the lower extremity. 2. Diabetes mellitus type 2 - last hemoglobin A1c 2 months ago was 6.9. Patient has been placed on sliding-scale coverage. Patient discharge may need to be on antidiabetic medications. Closely follow CBG trends. 3. Anemia - chronic probably from chronic kidney disease. Check anemia panel. Closely follow CBC. 4. Chronic kidney disease -  creatinine appears to be at baseline. Closely follow metabolic panel. 5. History of hypertension - presently on no medications. Closely follow blood pressure trends.    Code Status: Full code.  Family Communication: Family at the bedside.  Disposition Plan: Admit to inpatient.    Shirlie Enck N. [facility] Pager [phone].  If 7PM-7AM, please contact night-coverage [email or web address] Password [email or web address] 05/12/2013, 10:12 PM

## 2013-05-12 NOTE — ED Notes (Signed)
Pt taken to xray 

## 2013-05-12 NOTE — ED Notes (Signed)
Pt back from x-ray.

## 2013-05-12 NOTE — ED Notes (Signed)
Dr Kakrakandy in room 

## 2013-05-12 NOTE — Progress Notes (Signed)
ANTIBIOTIC CONSULT NOTE - INITIAL  Pharmacy Consult for ciprofloxacin and vancomycin Indication: osteomyelitis  No Known Allergies  Patient Measurements: Height: 6\' 1"  (185.4 cm) Weight: 235 lb (106.595 kg) IBW/kg (Calculated) : 79.9   Vital Signs: Temp: 98.5 F (36.9 C) (12/04 1906) Temp src: Oral (12/04 1906) BP: 135/63 mmHg (12/04 2000) Pulse Rate: 84 (12/04 2000) Intake/Output from previous day:   Intake/Output from this shift:    Labs:  Recent Labs  05/12/13 1733  WBC 7.4  HGB 12.0*  PLT 243  CREATININE 1.54*   Estimated Creatinine Clearance: 85 ml/min (by C-G formula based on Cr of 1.54). No results found for this basename: VANCOTROUGH, VANCOPEAK, VANCORANDOM, GENTTROUGH, GENTPEAK, GENTRANDOM, TOBRATROUGH, TOBRAPEAK, TOBRARND, AMIKACINPEAK, AMIKACINTROU, AMIKACIN,  in the last 72 hours   Microbiology: No results found for this or any previous visit (from the past 720 hour(s)).  Medical History: Past Medical History  Diagnosis Date  . Hypertension   . Diabetes mellitus     type 2 iddm x 18 yrs  . Vascular disease     poor circulation to left foot  . Acute osteomyelitis, ankle and foot 07/30/2010    Qualifier: Diagnosis of  By: Daiva Eves MD, Remi Haggard      Assessment: 62 YOM with diabetes s/p partial amputation in the past several months. Has had increased swelling and pain to his feet over the past few days. X-ray suspicious for osteomyelitis and patient admitted for IV antibiotics. To start ciprofloxacin and vancomycin per pharmacy. SCr 1.5mg /dL with est CrCL ~96EA/VWU. WBC 7.4, currently afebrile.  Goal of Therapy:  Vancomycin trough level 15-20 mcg/ml  Plan:  1. Ciprofloxacin 400mg  IV q12h 2. Vancomycin 1000mg  IV q8h 3. Follow c/s, clinical progression, any surgical plans, LOT, renal function, trough at [facility]  [place] Kretz D. Jayel Inks, PharmD, BCPS Clinical Pharmacist Pager: [phone] 05/12/2013 10:21 PM

## 2013-05-13 DIAGNOSIS — I10 Essential (primary) hypertension: Secondary | ICD-10-CM

## 2013-05-13 DIAGNOSIS — M7989 Other specified soft tissue disorders: Secondary | ICD-10-CM

## 2013-05-13 DIAGNOSIS — E1169 Type 2 diabetes mellitus with other specified complication: Secondary | ICD-10-CM

## 2013-05-13 DIAGNOSIS — M86179 Other acute osteomyelitis, unspecified ankle and foot: Secondary | ICD-10-CM

## 2013-05-13 DIAGNOSIS — M869 Osteomyelitis, unspecified: Secondary | ICD-10-CM

## 2013-05-13 DIAGNOSIS — L97509 Non-pressure chronic ulcer of other part of unspecified foot with unspecified severity: Secondary | ICD-10-CM

## 2013-05-13 DIAGNOSIS — M9080 Osteopathy in diseases classified elsewhere, unspecified site: Secondary | ICD-10-CM

## 2013-05-13 DIAGNOSIS — E119 Type 2 diabetes mellitus without complications: Secondary | ICD-10-CM

## 2013-05-13 LAB — BASIC METABOLIC PANEL
BUN: 20 mg/dL (ref 6–23)
CO2: 27 mEq/L (ref 19–32)
Chloride: 102 mEq/L (ref 96–112)
Creatinine, Ser: 1.53 mg/dL — ABNORMAL HIGH (ref 0.50–1.35)
Glucose, Bld: 138 mg/dL — ABNORMAL HIGH (ref 70–99)

## 2013-05-13 LAB — CBC
HCT: 30.9 % — ABNORMAL LOW (ref 39.0–52.0)
MCH: 31.2 pg (ref 26.0–34.0)
MCHC: 34.6 g/dL (ref 30.0–36.0)
MCV: 90.1 fL (ref 78.0–100.0)
Platelets: 200 10*3/uL (ref 150–400)
RDW: 14.2 % (ref 11.5–15.5)

## 2013-05-13 LAB — GLUCOSE, CAPILLARY
Glucose-Capillary: 122 mg/dL — ABNORMAL HIGH (ref 70–99)
Glucose-Capillary: 109 mg/dL — ABNORMAL HIGH (ref 70–99)
Glucose-Capillary: 166 mg/dL — ABNORMAL HIGH (ref 70–99)

## 2013-05-13 LAB — ABO/RH: ABO/RH(D): A POS

## 2013-05-13 MED ORDER — INSULIN ASPART 100 UNIT/ML ~~LOC~~ SOLN: 0.0000 [IU] | Freq: Every day | SUBCUTANEOUS | Status: DC

## 2013-05-13 MED ORDER — INSULIN ASPART 100 UNIT/ML ~~LOC~~ SOLN
0.0000 [IU] | Freq: Three times a day (TID) | SUBCUTANEOUS | Status: DC
  Administered 2013-05-14: 1 [IU] via SUBCUTANEOUS
  Administered 2013-05-14: 2 [IU] via SUBCUTANEOUS
  Administered 2013-05-16 (×2): 1 [IU] via SUBCUTANEOUS

## 2013-05-13 MED ORDER — MUPIROCIN CALCIUM 2 % EX CREA
TOPICAL_CREAM | Freq: Two times a day (BID) | CUTANEOUS | Status: DC
  Administered 2013-05-13 – 2013-05-16 (×8): via TOPICAL
  Administered 2013-05-17: 1 via TOPICAL
  Filled 2013-05-13: qty 15

## 2013-05-13 NOTE — Progress Notes (Signed)
[facility]                                                                                Patient Demographics  [name], is a 37 y.o. male, DOB - Jun 25, 1975, ZOX:[id number]  Admit date - 05/12/2013   Admitting Physician Curtis Clos, MD  Outpatient Primary MD for the patient is Curtis German, MD  LOS - 1   Chief Complaint  Patient presents with  . Foot Swelling        Assessment & Plan   Principal Problem:   Osteomyelitis Active Problems:   Anemia  Possible osteomyelitis of the right lower extremity -Continue IV vancomycin and ciprofloxacin -Dr. Lajoyce Clark, orthopedic surgery has been consulted -Recommendations are to continue IV antibiotics for 3-5 days and sending home on Cipro and doxycycline with outpatient followup -ESR 45 -Will consult wound care  Diabetes mellitus type 2 -Patient states he has been diet controlled -HbA1c pending, 2 months ago it was 6.9 -Continue insulin sliding scale coverage along with CBG monitoring  Hypertension -Currently normotensive -Currently on no medications, will continue to monitor  Chronic kidney disease, stage II -Patient appears to be at his baseline creatinine approximately 1.5 will continue to monitor  Anemia likely secondary to CKD -Baseline hemoglobin appears to be approximately 9 -Currently, H/H 10.7/30.9 -Currently hemodynamically stable with no signs of active bleeding, will continue to monitor -Anemia panel; Iron 53, TIBC 170, ferritin 244, folate 8.1  Code Status: Full  Family Communication: None at this time.  Disposition Plan: Admitted for approximately 3-5 days for IV antibiotics. Will be discharged home  Procedures  None  Consults   Dr. Lajoyce Clark, orthopedics  DVT Prophylaxis SCDs   Lab Results  Component Value Date   PLT 200 05/13/2013    Medications  Scheduled Meds: . ciprofloxacin  400 mg Intravenous Q12H  . insulin aspart  0-9 Units Subcutaneous TID WC  . mupirocin cream    Topical BID  . vancomycin  1,000 mg Intravenous Q8H   Continuous Infusions: . sodium chloride     PRN Meds:.acetaminophen, acetaminophen, morphine injection, ondansetron (ZOFRAN) IV, ondansetron  Antibiotics   Anti-infectives   Start     Dose/Rate Route Frequency Ordered Stop   05/12/13 2300  ciprofloxacin (CIPRO) IVPB 400 mg     400 mg 200 mL/hr over 60 Minutes Intravenous Every 12 hours 05/12/13 2222     05/12/13 2300  vancomycin (VANCOCIN) IVPB 1000 mg/200 mL premix     1,000 mg 200 mL/hr over 60 Minutes Intravenous Every 8 hours 05/12/13 2222     05/12/13 1845  vancomycin (VANCOCIN) IVPB 1000 mg/200 mL premix     1,000 mg 200 mL/hr over 60 Minutes Intravenous  Once 05/12/13 1842 05/12/13 1950   05/12/13 1845  piperacillin-tazobactam (ZOSYN) IVPB 3.375 g     3.375 g 12.5 mL/hr over 240 Minutes Intravenous  Once 05/12/13 1842 05/12/13 2343       Time Spent in minutes   35 minutes   [name] D.O. on 05/13/2013 at 9:55 AM  Between 7am to 7pm - Pager - [phone]  After 7pm go to [email or web address] - password [email or web address]  And look for the night  coverage person covering for me after hours  [facility] Office  [phone]    Subjective:   [name] seen and examined today.  Patient has no complaints today.  States his foot feels slightly improved, but still painful. Patient denies dizziness, chest pain, shortness of breath, abdominal pain, N/V/D/C, new weakness, numbess, tingling.    Objective:   Filed Vitals:   05/12/13 2000 05/12/13 2100 05/12/13 2223 05/13/13 0543  BP: 135/63  132/60 133/89  Pulse: 84  82 73  Temp:   98.7 F (37.1 C) 98.2 F (36.8 C)  TempSrc:    Oral  Resp:   18 18  Height:  6\' 1"  (1.854 m)    Weight:  106.595 kg (235 lb)    SpO2: 100%  100% 100%    Wt Readings from Last 3 Encounters:  05/12/13 106.595 kg (235 lb)  03/23/13 107.502 kg (237 lb)  03/23/13 107.502 kg (237 lb)     Intake/Output Summary (Last 24 hours)  at 05/13/13 0955 Last data filed at 05/13/13 0600  Gross per 24 hour  Intake    690 ml  Output    400 ml  Net    290 ml    Exam  General: Well developed, well nourished, NAD, appears stated age  HEENT: NCAT, PERRLA, EOMI, Anicteic Sclera, mucous membranes moist. No pharyngeal erythema or exudates  Neck: Supple, no JVD, no masses  Cardiovascular: S1 S2 auscultated, no rubs, murmurs or gallops. Regular rate and rhythm.  Respiratory: Clear to auscultation bilaterally with equal chest rise  Abdomen: Soft, nontender, nondistended, + bowel sounds  Extremities: warm dry without cyanosis clubbing. Trace swelling of feet.  Right foot, amputation of the first and second metatarsals. Ulceration noted with drainage on right foot surgical incision. Left foot also has multiple toe amputations including the great toe however incision well-healed, no drainage.   Neuro: AAOx3, cranial nerves grossly intact.   Skin: Without rashes exudates or nodules  Psych: Normal affect and demeanor with intact judgement and insight  Data Review   Micro Results No results found for this or any previous visit (from the past 240 hour(s)).  Radiology Reports Dg Foot Complete Left  05/12/2013   CLINICAL DATA:  Diabetic ulcer  EXAM: LEFT FOOT - COMPLETE 3+ VIEW  COMPARISON:  Prior radiographs 03/22/2013  FINDINGS: Compared to prior, interval amputation of the proximal phalanx of the great toe. Irregularity of the distal phalanx of the little toe is similar compared to prior. No conventional radiographic evidence of osteomyelitis. Soft tissue swelling is noted at the ball of the foot. No acute fracture or malalignment.  IMPRESSION: Soft tissue swelling at the ball of the foot. No conventional radiographic findings to suggest osteomyelitis.  Interval amputation of the proximal phalanx of the great toe compared to 03/22/2013.   Electronically Signed   By: [name] [name]   On: 05/12/2013 19:48   Dg Foot  Complete Right  05/12/2013   CLINICAL DATA:  Diabetic foot ulcer.  Foot pain and swelling.  EXAM: RIGHT FOOT COMPLETE - 3+ VIEW  COMPARISON:  03/22/2013  FINDINGS: Patient has undergone interval amputation of the 1st and 2nd toes. Mild osteolysis in periosteal reaction is seen at the surgical margin of the distal 2nd metatarsal, suspicious for osteomyelitis. No other sites of osteolysis or periostitis identified. Soft tissue swelling is seen involving the medial forefoot, without evidence of soft tissue gas. No evidence of fracture or dislocation.  IMPRESSION: Findings suspicious for osteomyelitis at  the distal 2nd metatarsal amputation site.   Electronically Signed   By: Myles Rosenthal [name]   On: 05/12/2013 19:49    CBC  Recent Labs Lab 05/12/13 1733 05/13/13 0526  WBC 7.4 6.6  HGB 12.0* 10.7*  HCT 35.2* 30.9*  PLT 243 200  MCV 91.2 90.1  MCH 31.1 31.2  MCHC 34.1 34.6  RDW 14.1 14.2    Chemistries   Recent Labs Lab 05/12/13 1733 05/13/13 0526  NA 138 136  K 4.3 4.1  CL 101 102  CO2 29 27  GLUCOSE 148* 138*  BUN 19 20  CREATININE 1.54* 1.53*  CALCIUM 9.4 8.8  AST 5  --   ALT 5  --   ALKPHOS 77  --   BILITOT 0.2*  --    ------------------------------------------------------------------------------------------------------------------ estimated creatinine clearance is 85.5 ml/min (by C-G formula based on Cr of 1.53). ------------------------------------------------------------------------------------------------------------------ No results found for this basename: HGBA1C,  in the last 72 hours ------------------------------------------------------------------------------------------------------------------ No results found for this basename: CHOL, HDL, LDLCALC, TRIG, CHOLHDL, LDLDIRECT,  in the last 72 hours ------------------------------------------------------------------------------------------------------------------ No results found for this basename: TSH, T4TOTAL,  FREET3, T3FREE, THYROIDAB,  in the last 72 hours ------------------------------------------------------------------------------------------------------------------ No results found for this basename: VITAMINB12, FOLATE, FERRITIN, TIBC, IRON, RETICCTPCT,  in the last 72 hours  Coagulation profile No results found for this basename: INR, PROTIME,  in the last 168 hours  No results found for this basename: DDIMER,  in the last 72 hours  Cardiac Enzymes No results found for this basename: CK, CKMB, TROPONINI, MYOGLOBIN,  in the last 168 hours ------------------------------------------------------------------------------------------------------------------ No components found with this basename: POCBNP,

## 2013-05-13 NOTE — Consult Note (Signed)
Please see note from Dr. Lajoyce Corners, he has written orders to address the right foot.  WOC will not consult as orders have been written for care just the am 05/13/13.  M. Eliberto Ivory RN,CWOCN [phone]

## 2013-05-13 NOTE — Progress Notes (Signed)
*  Preliminary Results* Bilateral lower extremity venous duplex completed. Bilateral lower extremities are negative for deep vein thrombosis. There is no evidence of Baker's cyst bilaterally.  05/13/2013  Kimbly Eanes, RVT, RDCS, RDMS  

## 2013-05-13 NOTE — Consult Note (Signed)
Reason for Consult: Ulceration possible osteomyelitis right foot status post first and second ray amputation with diabetic insensate neuropathy Referring Physician: Dr Emmit Pomfret is an 37 y.o. male.  HPI: Patient is a 37 year old gentleman who is about 2 months status post great toe amputation the left and first and second ray amputations of the right. Patient presents at this time with ulceration reported drainage from his right foot surgical incision. Patient denies any elevation of his blood sugars denies any fever or chills.  Past Medical History  Diagnosis Date  . Hypertension   . Diabetes mellitus     type 2 iddm x 18 yrs  . Vascular disease     poor circulation to left foot  . Acute osteomyelitis, ankle and foot 07/30/2010    Qualifier: Diagnosis of  By: Daiva Eves MD, Remi Haggard      Past Surgical History  Procedure Laterality Date  . Cholecystectomy    . Toe amputation  2012    left foot; great toe and second toe  . Amputation  04/15/2011    Procedure: AMPUTATION RAY;  Surgeon: Nadara Mustard, MD;  Location: [facility] OR;  Service: Orthopedics;  Laterality: Left;  left foot third toe amputation and MPP joint and gastroc resection VS. achilles lengthing   . Amputation  04/25/2011    Procedure: AMPUTATION DIGIT;  Surgeon: Nadara Mustard, MD;  Location: [facility] OR;  Service: Orthopedics;  Laterality: Left;  Left foot 3rd toe amputation MTP joint, Gastroc Recession  Achilles Lengthening   . Amputation Bilateral 03/25/2013    Procedure: AMPUTATION RAY;  Surgeon: Nadara Mustard, MD;  Location: [facility] OR;  Service: Orthopedics;  Laterality: Bilateral;  Left Great Toe Amputation at  MTP Joint, Right 1st and 2nd Ray Amputation     History reviewed. No pertinent family history.  Social History:  reports that he has been smoking Cigarettes.  He has a .25 pack-year smoking history. He has never used smokeless tobacco. He reports that he uses illicit drugs (Marijuana) about 10 times per week. He  reports that he does not drink alcohol.  Allergies: No Known Allergies  Medications: I have reviewed the patient's current medications.  Results for orders placed during the hospital encounter of 05/12/13 (from the past 48 hour(s))  GLUCOSE, CAPILLARY     Status: Abnormal   Collection Time    05/12/13  5:32 PM      Result Value Range   Glucose-Capillary 131 (*) 70 - 99 mg/dL  COMPREHENSIVE METABOLIC PANEL     Status: Abnormal   Collection Time    05/12/13  5:33 PM      Result Value Range   Sodium 138  135 - 145 mEq/L   Potassium 4.3  3.5 - 5.1 mEq/L   Chloride 101  96 - 112 mEq/L   CO2 29  19 - 32 mEq/L   Glucose, Bld 148 (*) 70 - 99 mg/dL   BUN 19  6 - 23 mg/dL   Creatinine, Ser 4.09 (*) 0.50 - 1.35 mg/dL   Calcium 9.4  8.4 - 81.1 mg/dL   Total Protein 8.4 (*) 6.0 - 8.3 g/dL   Albumin 3.5  3.5 - 5.2 g/dL   AST 5  0 - 37 U/L   ALT 5  0 - 53 U/L   Alkaline Phosphatase 77  39 - 117 U/L   Total Bilirubin 0.2 (*) 0.3 - 1.2 mg/dL   GFR calc non Af Amer 57 (*) >90 mL/min  GFR calc Af Amer 66 (*) >90 mL/min   Comment: (NOTE)     The eGFR has been calculated using the CKD EPI equation.     This calculation has not been validated in all clinical situations.     eGFR's persistently <90 mL/min signify possible Chronic Kidney     Disease.  CBC     Status: Abnormal   Collection Time    05/12/13  5:33 PM      Result Value Range   WBC 7.4  4.0 - 10.5 K/uL   RBC 3.86 (*) 4.22 - 5.81 MIL/uL   Hemoglobin 12.0 (*) 13.0 - 17.0 g/dL   HCT 16.1 (*) 09.6 - 04.5 %   MCV 91.2  78.0 - 100.0 fL   MCH 31.1  26.0 - 34.0 pg   MCHC 34.1  30.0 - 36.0 g/dL   RDW 40.9  81.1 - 91.4 %   Platelets 243  150 - 400 K/uL  CG4 I-STAT (LACTIC ACID)     Status: None   Collection Time    05/12/13  6:50 PM      Result Value Range   Lactic Acid, Venous 1.43  0.5 - 2.2 mmol/L  GLUCOSE, CAPILLARY     Status: Abnormal   Collection Time    05/12/13  9:17 PM      Result Value Range   Glucose-Capillary 175  (*) 70 - 99 mg/dL  SEDIMENTATION RATE     Status: Abnormal   Collection Time    05/12/13 11:10 PM      Result Value Range   Sed Rate 45 (*) 0 - 16 mm/hr  TYPE AND SCREEN     Status: None   Collection Time    05/12/13 11:10 PM      Result Value Range   ABO/RH(D) A POS     Antibody Screen NEG     Sample Expiration 05/15/2013    ABO/RH     Status: None   Collection Time    05/12/13 11:10 PM      Result Value Range   ABO/RH(D) A POS    GLUCOSE, CAPILLARY     Status: Abnormal   Collection Time    05/12/13 11:50 PM      Result Value Range   Glucose-Capillary 198 (*) 70 - 99 mg/dL  GLUCOSE, CAPILLARY     Status: Abnormal   Collection Time    05/13/13  4:07 AM      Result Value Range   Glucose-Capillary 122 (*) 70 - 99 mg/dL  CBC     Status: Abnormal   Collection Time    05/13/13  5:26 AM      Result Value Range   WBC 6.6  4.0 - 10.5 K/uL   RBC 3.43 (*) 4.22 - 5.81 MIL/uL   Hemoglobin 10.7 (*) 13.0 - 17.0 g/dL   HCT 78.2 (*) 95.6 - 21.3 %   MCV 90.1  78.0 - 100.0 fL   MCH 31.2  26.0 - 34.0 pg   MCHC 34.6  30.0 - 36.0 g/dL   RDW 08.6  57.8 - 46.9 %   Platelets 200  150 - 400 K/uL    Dg Foot Complete Left  05/12/2013   CLINICAL DATA:  Diabetic ulcer  EXAM: LEFT FOOT - COMPLETE 3+ VIEW  COMPARISON:  Prior radiographs 03/22/2013  FINDINGS: Compared to prior, interval amputation of the proximal phalanx of the great toe. Irregularity of the distal phalanx of the little toe is similar  compared to prior. No conventional radiographic evidence of osteomyelitis. Soft tissue swelling is noted at the ball of the foot. No acute fracture or malalignment.  IMPRESSION: Soft tissue swelling at the ball of the foot. No conventional radiographic findings to suggest osteomyelitis.  Interval amputation of the proximal phalanx of the great toe compared to 03/22/2013.   Electronically Signed   By: Malachy Moan [name]   On: 05/12/2013 19:48   Dg Foot Complete Right  05/12/2013   CLINICAL DATA:   Diabetic foot ulcer.  Foot pain and swelling.  EXAM: RIGHT FOOT COMPLETE - 3+ VIEW  COMPARISON:  03/22/2013  FINDINGS: Patient has undergone interval amputation of the 1st and 2nd toes. Mild osteolysis in periosteal reaction is seen at the surgical margin of the distal 2nd metatarsal, suspicious for osteomyelitis. No other sites of osteolysis or periostitis identified. Soft tissue swelling is seen involving the medial forefoot, without evidence of soft tissue gas. No evidence of fracture or dislocation.  IMPRESSION: Findings suspicious for osteomyelitis at the distal 2nd metatarsal amputation site.   Electronically Signed   By: Myles Rosenthal [name]   On: 05/12/2013 19:49    Review of Systems  All other systems reviewed and are negative.   Blood pressure 133/89, pulse 73, temperature 98.2 F (36.8 C), temperature source Oral, resp. rate 18, height 6\' 1"  (1.854 m), weight 106.595 kg (235 lb), SpO2 100.00%. Physical Exam On examination the left foot the surgical incision is well-healed there is no redness no cellulitis no fluctuance no signs of infection. Radiographs shows no bony abnormalities. Examination the right foot there is some red granulation tissue along the surgical incision. Use 1 nylon sutures still in place. The callus was removed and there was no fluctuance no drainage no signs of a deep abscess. Radiographs were reviewed which shows some changes over the distal aspect of the second metatarsal and this may be consistent with osteomyelitis. Patient has a normal white cell count has not had elevated blood glucose and has not had fever or chills. Assessment/Plan: Assessment: Well-healed amputation left great toe with slow healing surgical incision right foot with possible infection of the second metatarsal.  Plan: Would recommend IV antibiotics followed by a oral antibiotics. Discussed with the patient surgical intervention would require a transmetatarsal amputation and patient would like to try  to proceed with conservative therapy to see if antibiotics will resolve the problem. I will followup as an outpatient. Will start Bactroban dressing changes to the surgical incision.  Nataliyah Packham V 05/13/2013, 6:21 AM

## 2013-05-13 NOTE — Progress Notes (Signed)
UR completed 

## 2013-05-13 NOTE — Progress Notes (Signed)
Orthopedic Tech Progress Note Patient Details:  [name] 10-04-1975 [id number]  Ortho Devices Type of Ortho Device: Postop shoe/boot Ortho Device/Splint Interventions: Application   [name] 05/13/2013, 8:52 AM

## 2013-05-14 LAB — CBC
HCT: 33.4 % — ABNORMAL LOW (ref 39.0–52.0)
Hemoglobin: 11.1 g/dL — ABNORMAL LOW (ref 13.0–17.0)
MCV: 91 fL (ref 78.0–100.0)
Platelets: 227 10*3/uL (ref 150–400)
RBC: 3.67 MIL/uL — ABNORMAL LOW (ref 4.22–5.81)
RDW: 13.9 % (ref 11.5–15.5)
WBC: 6.8 10*3/uL (ref 4.0–10.5)

## 2013-05-14 LAB — GLUCOSE, CAPILLARY
Glucose-Capillary: 159 mg/dL — ABNORMAL HIGH (ref 70–99)
Glucose-Capillary: 122 mg/dL — ABNORMAL HIGH (ref 70–99)
Glucose-Capillary: 131 mg/dL — ABNORMAL HIGH (ref 70–99)

## 2013-05-14 LAB — VANCOMYCIN, TROUGH: Vancomycin Tr: 28.3 ug/mL (ref 10.0–20.0)

## 2013-05-14 MED ORDER — VANCOMYCIN HCL IN DEXTROSE 1-5 GM/200ML-% IV SOLN
1000.0000 mg | Freq: Two times a day (BID) | INTRAVENOUS | Status: DC
  Administered 2013-05-14 – 2013-05-17 (×6): 1000 mg via INTRAVENOUS
  Filled 2013-05-14 (×7): qty 200

## 2013-05-14 NOTE — Progress Notes (Signed)
ANTIBIOTIC CONSULT NOTE - FOLLOW UP  Pharmacy Consult for Vancomycin + Cipro Indication: R-foot osteo  No Known Allergies  Patient Measurements: Height: 6\' 1"  (185.4 cm) Weight: 235 lb (106.595 kg) IBW/kg (Calculated) : 79.9  Vital Signs: Temp: 98.2 F (36.8 C) (12/06 1354) Temp src: Oral (12/06 0538) BP: 142/84 mmHg (12/06 1354) Pulse Rate: 77 (12/06 1354) Intake/Output from previous day: 12/05 0701 - 12/06 0700 In: 1940 [P.O.:1340; I.V.:600] Out: 2550 [Urine:2550] Intake/Output from this shift: Total I/O In: 720 [P.O.:720] Out: -   Labs:  Recent Labs  05/12/13 1733 05/13/13 0526 05/14/13 0955  WBC 7.4 6.6 6.8  HGB 12.0* 10.7* 11.1*  PLT 243 200 227  CREATININE 1.54* 1.53*  --    Estimated Creatinine Clearance: 85.5 ml/min (by C-G formula based on Cr of 1.53).  Recent Labs  05/14/13 1353  VANCOTROUGH 28.3*     Microbiology: No results found for this or any previous visit (from the past 720 hour(s)).  Anti-infectives   Start     Dose/Rate Route Frequency Ordered Stop   05/12/13 2300  ciprofloxacin (CIPRO) IVPB 400 mg     400 mg 200 mL/hr over 60 Minutes Intravenous Every 12 hours 05/12/13 2222     05/12/13 2300  vancomycin (VANCOCIN) IVPB 1000 mg/200 mL premix     1,000 mg 200 mL/hr over 60 Minutes Intravenous Every 8 hours 05/12/13 2222     05/12/13 1845  vancomycin (VANCOCIN) IVPB 1000 mg/200 mL premix     1,000 mg 200 mL/hr over 60 Minutes Intravenous  Once 05/12/13 1842 05/12/13 1950   05/12/13 1845  piperacillin-tazobactam (ZOSYN) IVPB 3.375 g     3.375 g 12.5 mL/hr over 240 Minutes Intravenous  Once 05/12/13 1842 05/12/13 2343      Assessment: 37 y.o. M who continues on Vancomycin + Cipro for R-foot osteo with a SUPRAtherapeutic Vancomycin trough this afternoon (drawn VT 28.3, true VT ~26.6 mcg/ml, goal of 15-20 mcg/ml). SCr 1.53, CrCl~60-70 ml/min (normalized). Will reduce Vancomycin dose today.  Goal of Therapy:  Vancomycin trough level  15-20 mcg/ml  Plan:  1. Reduce Vancomycin to 1g IV every 12 hours 2. Continue Cipro 400 mg IV every 12 hours 3. Will continue to follow renal function, culture results, LOT, and antibiotic de-escalation plans   Georgina Pillion, PharmD, BCPS Clinical Pharmacist Pager: [phone] 05/14/2013 3:14 PM

## 2013-05-14 NOTE — Progress Notes (Signed)
[facility]                                                                                Patient Demographics  Loni Abdon, is a 37 y.o. male, DOB - 06/20/1975, ZOX:[id number]  Admit date - 05/12/2013   Admitting Physician Eduard Clos, MD  Outpatient Primary MD for the patient is Dorrene German, MD  LOS - 2   Chief Complaint  Patient presents with  . Foot Swelling        Assessment & Plan   Principal Problem:   Osteomyelitis Active Problems:   Anemia  Possible osteomyelitis of the right lower extremity -Continue IV vancomycin and ciprofloxacin -Dr. Lajoyce Corners, orthopedic surgery has been consulted -Recommendations are to continue IV antibiotics for 3-5 days and sending home on Cipro and doxycycline with outpatient followup -ESR 45 -Will consult wound care  Diabetes mellitus type 2 -Patient states he has been diet controlled -HbA1c pending, 2 months ago it was 6.9 -Continue insulin sliding scale coverage along with CBG monitoring  Hypertension -Currently normotensive -Currently on no medications, will continue to monitor  Chronic kidney disease, stage II -Patient appears to be at his baseline creatinine approximately 1.5 will continue to monitor  Anemia likely secondary to CKD -Baseline hemoglobin appears to be approximately 9 -Currently, H/H 10.7/30.9 -Currently hemodynamically stable with no signs of active bleeding, will continue to monitor -Anemia panel; Iron 53, TIBC 170, ferritin 244, folate 8.1  Code Status: Full  Family Communication: None at this time.  Disposition Plan: Admitted for approximately 3-5 days for IV antibiotics. Will be discharged home on PO antibiotics.    Procedures  None  Consults   Dr. Lajoyce Corners, orthopedics  DVT Prophylaxis SCDs   Lab Results  Component Value Date   PLT 200 05/13/2013    Medications  Scheduled Meds: . ciprofloxacin  400 mg Intravenous Q12H  . insulin aspart  0-5 Units Subcutaneous QHS  .  insulin aspart  0-9 Units Subcutaneous TID WC  . mupirocin cream   Topical BID  . vancomycin  1,000 mg Intravenous Q8H   Continuous Infusions:   PRN Meds:.acetaminophen, acetaminophen, morphine injection, ondansetron (ZOFRAN) IV, ondansetron  Antibiotics   Anti-infectives   Start     Dose/Rate Route Frequency Ordered Stop   05/12/13 2300  ciprofloxacin (CIPRO) IVPB 400 mg     400 mg 200 mL/hr over 60 Minutes Intravenous Every 12 hours 05/12/13 2222     05/12/13 2300  vancomycin (VANCOCIN) IVPB 1000 mg/200 mL premix     1,000 mg 200 mL/hr over 60 Minutes Intravenous Every 8 hours 05/12/13 2222     05/12/13 1845  vancomycin (VANCOCIN) IVPB 1000 mg/200 mL premix     1,000 mg 200 mL/hr over 60 Minutes Intravenous  Once 05/12/13 1842 05/12/13 1950   05/12/13 1845  piperacillin-tazobactam (ZOSYN) IVPB 3.375 g     3.375 g 12.5 mL/hr over 240 Minutes Intravenous  Once 05/12/13 1842 05/12/13 2343       Time Spent in minutes   25 minutes   Mario Voong D.O. on 05/14/2013 at 11:04 AM  Between 7am to 7pm - Pager - [phone]  After 7pm go to [email or web address] -  password [email or web address]  And look for the night coverage person covering for me after hours  [facility] Office  [phone]    Subjective:   Kalen Ratajczak seen and examined today.  Patient complains of right foot pain. Patient denies dizziness, chest pain, shortness of breath, abdominal pain, N/V/D/C, new weakness, numbess, tingling.    Objective:   Filed Vitals:   05/13/13 0543 05/13/13 1300 05/13/13 2201 05/14/13 0538  BP: 133/89 131/86 137/76 134/85  Pulse: 73 76 76 70  Temp: 98.2 F (36.8 C) 97.6 F (36.4 C) 98.3 F (36.8 C) 98.4 F (36.9 C)  TempSrc: Oral  Oral Oral  Resp: 18 17 16 16   Height:      Weight:      SpO2: 100% 100% 100% 99%    Wt Readings from Last 3 Encounters:  05/12/13 106.595 kg (235 lb)  03/23/13 107.502 kg (237 lb)  03/23/13 107.502 kg (237 lb)     Intake/Output Summary  (Last 24 hours) at 05/14/13 1104 Last data filed at 05/14/13 0800  Gross per 24 hour  Intake   2040 ml  Output   2350 ml  Net   -310 ml    Exam  General: Well developed, well nourished, NAD, appears stated age  HEENT: NCAT, PERRLA, mucous membranes moist.   Neck: Supple, no JVD, no masses  Cardiovascular: S1 S2 auscultated, no rubs, murmurs or gallops. Regular rate and rhythm.  Respiratory: Clear to auscultation bilaterally with equal chest rise  Abdomen: Soft, nontender, nondistended, + bowel sounds  Extremities: warm dry without cyanosis clubbing. Trace swelling of feet.  Right foot currently wrapped  Neuro: AAOx3, cranial nerves grossly intact.   Skin: Without rashes exudates or nodules  Psych: Normal affect and demeanor with intact judgement and insight  Data Review   Micro Results No results found for this or any previous visit (from the past 240 hour(s)).  Radiology Reports Dg Foot Complete Left  05/12/2013   CLINICAL DATA:  Diabetic ulcer  EXAM: LEFT FOOT - COMPLETE 3+ VIEW  COMPARISON:  Prior radiographs 03/22/2013  FINDINGS: Compared to prior, interval amputation of the proximal phalanx of the great toe. Irregularity of the distal phalanx of the little toe is similar compared to prior. No conventional radiographic evidence of osteomyelitis. Soft tissue swelling is noted at the ball of the foot. No acute fracture or malalignment.  IMPRESSION: Soft tissue swelling at the ball of the foot. No conventional radiographic findings to suggest osteomyelitis.  Interval amputation of the proximal phalanx of the great toe compared to 03/22/2013.   Electronically Signed   By: Malachy Moan [name]   On: 05/12/2013 19:48   Dg Foot Complete Right  05/12/2013   CLINICAL DATA:  Diabetic foot ulcer.  Foot pain and swelling.  EXAM: RIGHT FOOT COMPLETE - 3+ VIEW  COMPARISON:  03/22/2013  FINDINGS: Patient has undergone interval amputation of the 1st and 2nd toes. Mild osteolysis in  periosteal reaction is seen at the surgical margin of the distal 2nd metatarsal, suspicious for osteomyelitis. No other sites of osteolysis or periostitis identified. Soft tissue swelling is seen involving the medial forefoot, without evidence of soft tissue gas. No evidence of fracture or dislocation.  IMPRESSION: Findings suspicious for osteomyelitis at the distal 2nd metatarsal amputation site.   Electronically Signed   By: Myles Rosenthal [name]   On: 05/12/2013 19:49    CBC  Recent Labs Lab 05/12/13 1733 05/13/13 0526  WBC 7.4 6.6  HGB 12.0* 10.7*  HCT 35.2* 30.9*  PLT 243 200  MCV 91.2 90.1  MCH 31.1 31.2  MCHC 34.1 34.6  RDW 14.1 14.2    Chemistries   Recent Labs Lab 05/12/13 1733 05/13/13 0526  NA 138 136  K 4.3 4.1  CL 101 102  CO2 29 27  GLUCOSE 148* 138*  BUN 19 20  CREATININE 1.54* 1.53*  CALCIUM 9.4 8.8  AST 5  --   ALT 5  --   ALKPHOS 77  --   BILITOT 0.2*  --    ------------------------------------------------------------------------------------------------------------------ estimated creatinine clearance is 85.5 ml/min (by C-G formula based on Cr of 1.53). ------------------------------------------------------------------------------------------------------------------  Recent Labs  05/12/13 2310  HGBA1C 6.7*   ------------------------------------------------------------------------------------------------------------------ No results found for this basename: CHOL, HDL, LDLCALC, TRIG, CHOLHDL, LDLDIRECT,  in the last 72 hours ------------------------------------------------------------------------------------------------------------------ No results found for this basename: TSH, T4TOTAL, FREET3, T3FREE, THYROIDAB,  in the last 72 hours ------------------------------------------------------------------------------------------------------------------ No results found for this basename: VITAMINB12, FOLATE, FERRITIN, TIBC, IRON, RETICCTPCT,  in the last 72  hours  Coagulation profile No results found for this basename: INR, PROTIME,  in the last 168 hours  No results found for this basename: DDIMER,  in the last 72 hours  Cardiac Enzymes No results found for this basename: CK, CKMB, TROPONINI, MYOGLOBIN,  in the last 168 hours ------------------------------------------------------------------------------------------------------------------ No components found with this basename: POCBNP,

## 2013-05-15 DIAGNOSIS — N179 Acute kidney failure, unspecified: Secondary | ICD-10-CM

## 2013-05-15 DIAGNOSIS — N189 Chronic kidney disease, unspecified: Secondary | ICD-10-CM

## 2013-05-15 LAB — CBC
HCT: 30.4 % — ABNORMAL LOW (ref 39.0–52.0)
MCHC: 33.9 g/dL (ref 30.0–36.0)
Platelets: 206 10*3/uL (ref 150–400)
RBC: 3.37 MIL/uL — ABNORMAL LOW (ref 4.22–5.81)
RDW: 13.9 % (ref 11.5–15.5)
WBC: 7.7 10*3/uL (ref 4.0–10.5)

## 2013-05-15 LAB — BASIC METABOLIC PANEL
CO2: 27 mEq/L (ref 19–32)
Chloride: 100 mEq/L (ref 96–112)
GFR calc Af Amer: 73 mL/min — ABNORMAL LOW (ref >90)
GFR calc non Af Amer: 63 mL/min — ABNORMAL LOW (ref >90)
Potassium: 3.8 mEq/L (ref 3.5–5.1)
Sodium: 136 mEq/L (ref 135–145)

## 2013-05-15 LAB — GLUCOSE, CAPILLARY
Glucose-Capillary: 117 mg/dL — ABNORMAL HIGH (ref 70–99)
Glucose-Capillary: 96 mg/dL (ref 70–99)
Glucose-Capillary: 79 mg/dL (ref 70–99)

## 2013-05-15 NOTE — Progress Notes (Signed)
[facility]                                                                                Patient Demographics  [name], is a 37 y.o. male, DOB - [birth year], ZOX:[id number]  Admit date - 05/12/2013   Admitting Physician Eduard Clos, MD  Outpatient Primary MD for the patient is Dorrene German, MD  LOS - 3   Chief Complaint  Patient presents with  . Foot Swelling        Assessment & Plan   Principal Problem:   Osteomyelitis Active Problems:   Anemia  Possible osteomyelitis of the right lower extremity -Continue IV vancomycin and ciprofloxacin -Dr. Lajoyce Corners, orthopedic surgery has been consulted -Recommendations are to continue IV antibiotics for 3-5 days and sending home on Cipro and doxycycline with outpatient followup -ESR 45 -Wound care consulted.  Diabetes mellitus type 2 -Patient states he has been diet controlled -HbA1c pending, 2 months ago it was 6.9 -Continue insulin sliding scale coverage along with CBG monitoring  Hypertension -Currently normotensive -Currently on no medications, will continue to monitor  Chronic kidney disease, stage II -Patient appears to be at his baseline creatinine approximately 1.5 will continue to monitor  Anemia likely secondary to CKD -Baseline hemoglobin appears to be approximately 9 -Currently, H/H 10.3/30.4 -Currently hemodynamically stable with no signs of active bleeding, will continue to monitor -Anemia panel; Iron 53, TIBC 170, ferritin 244, folate 8.1  Code Status: Full  Family Communication: None at this time.  Disposition Plan: Admitted for approximately 3-5 days for IV antibiotics. Will be discharged home on PO antibiotics.    Procedures  None  Consults   Dr. Lajoyce Corners, orthopedics  DVT Prophylaxis SCDs   Lab Results  Component Value Date   PLT 206 05/15/2013    Medications  Scheduled Meds: . ciprofloxacin  400 mg Intravenous Q12H  . insulin aspart  0-5 Units Subcutaneous QHS  . insulin  aspart  0-9 Units Subcutaneous TID WC  . mupirocin cream   Topical BID  . vancomycin  1,000 mg Intravenous Q12H   Continuous Infusions:   PRN Meds:.acetaminophen, acetaminophen, morphine injection, ondansetron (ZOFRAN) IV, ondansetron  Antibiotics   Anti-infectives   Start     Dose/Rate Route Frequency Ordered Stop   05/14/13 2200  vancomycin (VANCOCIN) IVPB 1000 mg/200 mL premix     1,000 mg 200 mL/hr over 60 Minutes Intravenous Every 12 hours 05/14/13 1514     05/12/13 2300  ciprofloxacin (CIPRO) IVPB 400 mg     400 mg 200 mL/hr over 60 Minutes Intravenous Every 12 hours 05/12/13 2222     05/12/13 2300  vancomycin (VANCOCIN) IVPB 1000 mg/200 mL premix  Status:  Discontinued     1,000 mg 200 mL/hr over 60 Minutes Intravenous Every 8 hours 05/12/13 2222 05/14/13 1514   05/12/13 1845  vancomycin (VANCOCIN) IVPB 1000 mg/200 mL premix     1,000 mg 200 mL/hr over 60 Minutes Intravenous  Once 05/12/13 1842 05/12/13 1950   05/12/13 1845  piperacillin-tazobactam (ZOSYN) IVPB 3.375 g     3.375 g 12.5 mL/hr over 240 Minutes Intravenous  Once 05/12/13 1842 05/12/13 2343  Time Spent in minutes   25 minutes   Kayleeann Huxford D.O. on 05/15/2013 at 11:30 AM  Between 7am to 7pm - Pager - [phone]  After 7pm go to [email or web address] - password [email or web address]  And look for the night coverage person covering for me after hours  [facility] Office  [phone]    Subjective:   [name] seen and examined today.  Patient still complains of right foot pain. Patient denies dizziness, chest pain, shortness of breath, abdominal pain, N/V/D/C, new weakness, numbess, tingling.    Objective:   Filed Vitals:   05/14/13 0538 05/14/13 1354 05/14/13 2042 05/15/13 0637  BP: 134/85 142/84 133/88 127/82  Pulse: 70 77 72 73  Temp: 98.4 F (36.9 C) 98.2 F (36.8 C) 97.9 F (36.6 C) 98 F (36.7 C)  TempSrc: Oral  Oral Oral  Resp: 16 16 16 16   Height:      Weight:      SpO2:  99% 100% 100% 100%    Wt Readings from Last 3 Encounters:  05/12/13 106.595 kg (235 lb)  03/23/13 107.502 kg (237 lb)  03/23/13 107.502 kg (237 lb)     Intake/Output Summary (Last 24 hours) at 05/15/13 1130 Last data filed at 05/15/13 0800  Gross per 24 hour  Intake   3030 ml  Output    350 ml  Net   2680 ml    Exam  General: Well developed, well nourished, NAD, appears stated age  HEENT: NCAT, PERRLA, mucous membranes moist.   Neck: Supple, no JVD, no masses  Cardiovascular: S1 S2 auscultated, no rubs, murmurs or gallops. Regular rate and rhythm.  Respiratory: Clear to auscultation bilaterally with equal chest rise  Abdomen: Soft, nontender, nondistended, + bowel sounds  Extremities: warm dry without cyanosis clubbing. Trace swelling of feet.  Right foot currently wrapped  Neuro: AAOx3, cranial nerves grossly intact.   Skin: Without rashes exudates or nodules  Psych: Normal affect and demeanor with intact judgement and insight  Data Review   Micro Results No results found for this or any previous visit (from the past 240 hour(s)).  Radiology Reports Dg Foot Complete Left  05/12/2013   CLINICAL DATA:  Diabetic ulcer  EXAM: LEFT FOOT - COMPLETE 3+ VIEW  COMPARISON:  Prior radiographs 03/22/2013  FINDINGS: Compared to prior, interval amputation of the proximal phalanx of the great toe. Irregularity of the distal phalanx of the little toe is similar compared to prior. No conventional radiographic evidence of osteomyelitis. Soft tissue swelling is noted at the ball of the foot. No acute fracture or malalignment.  IMPRESSION: Soft tissue swelling at the ball of the foot. No conventional radiographic findings to suggest osteomyelitis.  Interval amputation of the proximal phalanx of the great toe compared to 03/22/2013.   Electronically Signed   By: Malachy Moan [name]   On: 05/12/2013 19:48   Dg Foot Complete Right  05/12/2013   CLINICAL DATA:  Diabetic foot ulcer.   Foot pain and swelling.  EXAM: RIGHT FOOT COMPLETE - 3+ VIEW  COMPARISON:  03/22/2013  FINDINGS: Patient has undergone interval amputation of the 1st and 2nd toes. Mild osteolysis in periosteal reaction is seen at the surgical margin of the distal 2nd metatarsal, suspicious for osteomyelitis. No other sites of osteolysis or periostitis identified. Soft tissue swelling is seen involving the medial forefoot, without evidence of soft tissue gas. No evidence of fracture or dislocation.  IMPRESSION: Findings suspicious for osteomyelitis at the distal 2nd metatarsal amputation site.  Electronically Signed   By: Myles Rosenthal [name]   On: 05/12/2013 19:49    CBC  Recent Labs Lab 05/12/13 1733 05/13/13 0526 05/14/13 0955 05/15/13 0300  WBC 7.4 6.6 6.8 7.7  HGB 12.0* 10.7* 11.1* 10.3*  HCT 35.2* 30.9* 33.4* 30.4*  PLT 243 200 227 206  MCV 91.2 90.1 91.0 90.2  MCH 31.1 31.2 30.2 30.6  MCHC 34.1 34.6 33.2 33.9  RDW 14.1 14.2 13.9 13.9    Chemistries   Recent Labs Lab 05/12/13 1733 05/13/13 0526 05/15/13 0300  NA 138 136 136  K 4.3 4.1 3.8  CL 101 102 100  CO2 29 27 27   GLUCOSE 148* 138* 149*  BUN 19 20 19   CREATININE 1.54* 1.53* 1.41*  CALCIUM 9.4 8.8 8.8  AST 5  --   --   ALT 5  --   --   ALKPHOS 77  --   --   BILITOT 0.2*  --   --    ------------------------------------------------------------------------------------------------------------------ estimated creatinine clearance is 92.8 ml/min (by C-G formula based on Cr of 1.41). ------------------------------------------------------------------------------------------------------------------  Recent Labs  05/12/13 2310  HGBA1C 6.7*   ------------------------------------------------------------------------------------------------------------------ No results found for this basename: CHOL, HDL, LDLCALC, TRIG, CHOLHDL, LDLDIRECT,  in the last 72  hours ------------------------------------------------------------------------------------------------------------------ No results found for this basename: TSH, T4TOTAL, FREET3, T3FREE, THYROIDAB,  in the last 72 hours ------------------------------------------------------------------------------------------------------------------ No results found for this basename: VITAMINB12, FOLATE, FERRITIN, TIBC, IRON, RETICCTPCT,  in the last 72 hours  Coagulation profile No results found for this basename: INR, PROTIME,  in the last 168 hours  No results found for this basename: DDIMER,  in the last 72 hours  Cardiac Enzymes No results found for this basename: CK, CKMB, TROPONINI, MYOGLOBIN,  in the last 168 hours ------------------------------------------------------------------------------------------------------------------ No components found with this basename: POCBNP,

## 2013-05-16 LAB — GLUCOSE, CAPILLARY
Glucose-Capillary: 118 mg/dL — ABNORMAL HIGH (ref 70–99)
Glucose-Capillary: 125 mg/dL — ABNORMAL HIGH (ref 70–99)
Glucose-Capillary: 139 mg/dL — ABNORMAL HIGH (ref 70–99)
Glucose-Capillary: 143 mg/dL — ABNORMAL HIGH (ref 70–99)

## 2013-05-16 MED ORDER — HYDROCODONE-ACETAMINOPHEN 5-325 MG PO TABS
1.0000 | ORAL_TABLET | ORAL | Status: DC | PRN
  Administered 2013-05-16 – 2013-05-17 (×4): 2 via ORAL
  Filled 2013-05-16 (×4): qty 2

## 2013-05-16 NOTE — Progress Notes (Signed)
[facility]                                                                                Patient Demographics  [name], is a 37 y.o. male, DOB - 12/09/1975, AVW:[id number]  Admit date - 05/12/2013   Admitting Physician Eduard Clos, MD  Outpatient Primary MD for the patient is Dorrene German, MD  LOS - 4   Chief Complaint  Patient presents with  . Foot Swelling        Assessment & Plan   Principal Problem:   Osteomyelitis Active Problems:   Anemia  Possible osteomyelitis of the right lower extremity -Continue IV vancomycin and ciprofloxacin -Dr. Lajoyce Corners, orthopedic surgery has been consulted -Recommendations are to continue IV antibiotics for 3-5 days and sending home on Cipro and doxycycline with outpatient followup -ESR 45 -Wound care consulted.  Diabetes mellitus type 2 -Patient states he has been diet controlled -HbA1c pending, 2 months ago it was 6.9 -Continue insulin sliding scale coverage along with CBG monitoring  Hypertension -Currently normotensive -Currently on no medications, will continue to monitor  Chronic kidney disease, stage II -Stable -Patient appears to be at his baseline creatinine approximately 1.5 will continue to monitor  Anemia likely secondary to CKD -Baseline hemoglobin appears to be approximately 9 -H/H 10.3/30.4 -Currently hemodynamically stable with no signs of active bleeding, will continue to monitor -Anemia panel; Iron 53, TIBC 170, ferritin 244, folate 8.1  Code Status: Full  Family Communication: None at this time.  Disposition Plan: Admitted for approximately 3-5 days for IV antibiotics. Will be discharged home on PO antibiotics on 12/9  Procedures  None  Consults   Dr. Lajoyce Corners, orthopedics  DVT Prophylaxis SCDs   Lab Results  Component Value Date   PLT 206 05/15/2013    Medications  Scheduled Meds: . ciprofloxacin  400 mg Intravenous Q12H  . insulin aspart  0-5 Units Subcutaneous QHS  .  insulin aspart  0-9 Units Subcutaneous TID WC  . mupirocin cream   Topical BID  . vancomycin  1,000 mg Intravenous Q12H   Continuous Infusions:   PRN Meds:.acetaminophen, acetaminophen, morphine injection, ondansetron (ZOFRAN) IV, ondansetron  Antibiotics   Anti-infectives   Start     Dose/Rate Route Frequency Ordered Stop   05/14/13 2200  vancomycin (VANCOCIN) IVPB 1000 mg/200 mL premix     1,000 mg 200 mL/hr over 60 Minutes Intravenous Every 12 hours 05/14/13 1514     05/12/13 2300  ciprofloxacin (CIPRO) IVPB 400 mg     400 mg 200 mL/hr over 60 Minutes Intravenous Every 12 hours 05/12/13 2222     05/12/13 2300  vancomycin (VANCOCIN) IVPB 1000 mg/200 mL premix  Status:  Discontinued     1,000 mg 200 mL/hr over 60 Minutes Intravenous Every 8 hours 05/12/13 2222 05/14/13 1514   05/12/13 1845  vancomycin (VANCOCIN) IVPB 1000 mg/200 mL premix     1,000 mg 200 mL/hr over 60 Minutes Intravenous  Once 05/12/13 1842 05/12/13 1950   05/12/13 1845  piperacillin-tazobactam (ZOSYN) IVPB 3.375 g     3.375 g 12.5 mL/hr over 240 Minutes Intravenous  Once 05/12/13 1842 05/12/13 2343  Time Spent in minutes   25 minutes   [place] D.O. on 05/16/2013 at 10:08 AM  Between 7am to 7pm - Pager - [phone]  After 7pm go to [email or web address] - password [email or web address]  And look for the night coverage person covering for me after hours  [facility] Office  [phone]    Subjective:   [name] seen and examined today.  Patient still complains of right foot pain, however seen walking in room.  Patient denies dizziness, chest pain, shortness of breath, abdominal pain, N/V/D/C, new weakness, numbess, tingling.    Objective:   Filed Vitals:   05/15/13 0637 05/15/13 1355 05/15/13 2037 05/16/13 0541  BP: 127/82 135/88 155/99 130/80  Pulse: 73 74 72 72  Temp: 98 F (36.7 C) 97.7 F (36.5 C) 98.1 F (36.7 C) 98 F (36.7 C)  TempSrc: Oral   Oral  Resp: 16 16 17 18    Height:      Weight:      SpO2: 100% 100% 100% 100%    Wt Readings from Last 3 Encounters:  05/12/13 106.595 kg (235 lb)  03/23/13 107.502 kg (237 lb)  03/23/13 107.502 kg (237 lb)     Intake/Output Summary (Last 24 hours) at 05/16/13 1008 Last data filed at 05/16/13 0756  Gross per 24 hour  Intake   2090 ml  Output      0 ml  Net   2090 ml    Exam  General: Well developed, well nourished, NAD, appears stated age  HEENT: NCAT, PERRLA, mucous membranes moist.   Neck: Supple, no JVD, no masses  Cardiovascular: S1 S2 auscultated, no rubs, murmurs or gallops. Regular rate and rhythm.  Respiratory: Clear to auscultation bilaterally with equal chest rise  Abdomen: Soft, nontender, nondistended, + bowel sounds  Extremities: warm dry without cyanosis clubbing. Trace swelling of feet.  Right foot currently wrapped  Neuro: AAOx3, cranial nerves grossly intact.   Skin: Without rashes exudates or nodules  Psych: Normal affect and demeanor with intact judgement and insight  Data Review   Micro Results No results found for this or any previous visit (from the past 240 hour(s)).  Radiology Reports Dg Foot Complete Left  05/12/2013   CLINICAL DATA:  Diabetic ulcer  EXAM: LEFT FOOT - COMPLETE 3+ VIEW  COMPARISON:  Prior radiographs 03/22/2013  FINDINGS: Compared to prior, interval amputation of the proximal phalanx of the great toe. Irregularity of the distal phalanx of the little toe is similar compared to prior. No conventional radiographic evidence of osteomyelitis. Soft tissue swelling is noted at the ball of the foot. No acute fracture or malalignment.  IMPRESSION: Soft tissue swelling at the ball of the foot. No conventional radiographic findings to suggest osteomyelitis.  Interval amputation of the proximal phalanx of the great toe compared to 03/22/2013.   Electronically Signed   By: Malachy Moan [name]   On: 05/12/2013 19:48   Dg Foot Complete Right  05/12/2013    CLINICAL DATA:  Diabetic foot ulcer.  Foot pain and swelling.  EXAM: RIGHT FOOT COMPLETE - 3+ VIEW  COMPARISON:  03/22/2013  FINDINGS: Patient has undergone interval amputation of the 1st and 2nd toes. Mild osteolysis in periosteal reaction is seen at the surgical margin of the distal 2nd metatarsal, suspicious for osteomyelitis. No other sites of osteolysis or periostitis identified. Soft tissue swelling is seen involving the medial forefoot, without evidence of soft tissue gas. No evidence of fracture or dislocation.  IMPRESSION: Findings suspicious for  osteomyelitis at the distal 2nd metatarsal amputation site.   Electronically Signed   By: Myles Rosenthal [name]   On: 05/12/2013 19:49    CBC  Recent Labs Lab 05/12/13 1733 05/13/13 0526 05/14/13 0955 05/15/13 0300  WBC 7.4 6.6 6.8 7.7  HGB 12.0* 10.7* 11.1* 10.3*  HCT 35.2* 30.9* 33.4* 30.4*  PLT 243 200 227 206  MCV 91.2 90.1 91.0 90.2  MCH 31.1 31.2 30.2 30.6  MCHC 34.1 34.6 33.2 33.9  RDW 14.1 14.2 13.9 13.9    Chemistries   Recent Labs Lab 05/12/13 1733 05/13/13 0526 05/15/13 0300  NA 138 136 136  K 4.3 4.1 3.8  CL 101 102 100  CO2 29 27 27   GLUCOSE 148* 138* 149*  BUN 19 20 19   CREATININE 1.54* 1.53* 1.41*  CALCIUM 9.4 8.8 8.8  AST 5  --   --   ALT 5  --   --   ALKPHOS 77  --   --   BILITOT 0.2*  --   --    ------------------------------------------------------------------------------------------------------------------ estimated creatinine clearance is 92.8 ml/min (by C-G formula based on Cr of 1.41). ------------------------------------------------------------------------------------------------------------------ No results found for this basename: HGBA1C,  in the last 72 hours ------------------------------------------------------------------------------------------------------------------ No results found for this basename: CHOL, HDL, LDLCALC, TRIG, CHOLHDL, LDLDIRECT,  in the last 72  hours ------------------------------------------------------------------------------------------------------------------ No results found for this basename: TSH, T4TOTAL, FREET3, T3FREE, THYROIDAB,  in the last 72 hours ------------------------------------------------------------------------------------------------------------------ No results found for this basename: VITAMINB12, FOLATE, FERRITIN, TIBC, IRON, RETICCTPCT,  in the last 72 hours  Coagulation profile No results found for this basename: INR, PROTIME,  in the last 168 hours  No results found for this basename: DDIMER,  in the last 72 hours  Cardiac Enzymes No results found for this basename: CK, CKMB, TROPONINI, MYOGLOBIN,  in the last 168 hours ------------------------------------------------------------------------------------------------------------------ No components found with this basename: POCBNP,

## 2013-05-16 NOTE — ED Provider Notes (Signed)
Medical screening examination/treatment/procedure(s) were conducted as a shared visit with non-physician practitioner(s) and myself.  I personally evaluated the patient during the encounter.  EKG Interpretation    Date/Time:    Ventricular Rate:    PR Interval:    QRS Duration:   QT Interval:    QTC Calculation:   R Axis:     Text Interpretation:             37yM with atraumatic foot pain and swelling. On exam RLE swelling from foot to mid shin. Tender to palpation over foot. Increased warmth. Able to range R ankle with no apparent difficulty. XR concerning for osteomyelitis. Hx of same s/p previous orthopedic surgery. Abx, ortho consult, admit.   Raeford Razor, MD 05/16/13 [phone]

## 2013-05-17 DIAGNOSIS — R112 Nausea with vomiting, unspecified: Secondary | ICD-10-CM

## 2013-05-17 DIAGNOSIS — I798 Other disorders of arteries, arterioles and capillaries in diseases classified elsewhere: Secondary | ICD-10-CM

## 2013-05-17 DIAGNOSIS — E1159 Type 2 diabetes mellitus with other circulatory complications: Secondary | ICD-10-CM

## 2013-05-17 MED ORDER — DOXYCYCLINE HYCLATE 50 MG PO CAPS: 50.0000 mg | ORAL_CAPSULE | Freq: Two times a day (BID) | ORAL | Status: DC

## 2013-05-17 MED ORDER — HYDROCODONE-ACETAMINOPHEN 5-325 MG PO TABS: 1.0000 | ORAL_TABLET | ORAL | Status: DC | PRN

## 2013-05-17 MED ORDER — ONDANSETRON HCL 4 MG PO TABS
4.0000 mg | ORAL_TABLET | Freq: Once | ORAL | Status: AC
  Administered 2013-05-17: 4 mg via ORAL
  Filled 2013-05-17: qty 1

## 2013-05-17 MED ORDER — CIPROFLOXACIN HCL 500 MG PO TABS: 500.0000 mg | ORAL_TABLET | Freq: Two times a day (BID) | ORAL | Status: DC

## 2013-05-17 NOTE — Progress Notes (Deleted)
Physician Discharge Summary  [name] ZOX:[id number] DOB: Jan 04, 1976 DOA: 05/12/2013  PCP: Dorrene German, MD  Admit date: 05/12/2013 Discharge date: 05/17/2013  Time spent: 35 minutes  Recommendations for Outpatient Follow-up:  Patient should follow up with his primary care physician in one week of discharge. He should also follow Dr. Lajoyce Corners within 2 weeks of discharge. He should continue taking his medications as prescribed.  Discharge Diagnoses:  Principal Problem:   Osteomyelitis Active Problems:   Diabetes mellitus   Nausea & vomiting   Renal failure, acute on chronic   Anemia   Essential hypertension, benign   Discharge Condition: Stable  Diet recommendation: Heart healthy and carb modified  Filed Weights   05/12/13 2100  Weight: 106.595 kg (235 lb)    History of present illness:  [name] is a 37 y.o. male who was admitted in October 2 months ago for osteomyelitis and had undergone amputation of the right foot first and second ray and left foot great toe secondary to abscess and osteomyelitis started experiencing swelling and pain of the both feet last 2 days. Patient also noticed mild discharge from the right foot surgical area. Denies any fever chills. X-rays show possible osteomyelitis of the right foot metatarsal area. On-call orthopedic surgeon was consulted by the ER physician and was advised to keep patient n.p.o. past midnight and Dr. Lajoyce Corners, patient's orthopedic surgeon will be seeing patient in a.m. Patient has been started with antibiotics and has been admitted for further management. Patient otherwise denies any chest pain or shortness of breath nausea vomiting abdominal pain diarrhea.   Hospital Course:  This is a 37 year old male with a history of osteomyelitis, diabetes mellitus, CAD stage II that presented to the emergency department for lower chimney pain. He was not a possible osteomyelitis. Started on IV vancomycin and ciprofloxacin. Dr. Lajoyce Corners, with  orthopedic surgeon was consulted and did recommend 3-5 days of IV antibiotics and then sending the patient home on ciprofloxacin and doxycycline with outpatient followup. Wound care was also consulted for patient's right lower extremity and wound. Patient also has a history of diabetes mellitus type 2. He had not been taking any medications for this in the diet-controlled. His hemoglobin A1c was noted to be 6.92 months ago. Patient was placed on insulin sliding coverage along with CBG monitoring however his blood sugars did remain stable. Patient also has a history of hypertension however on no medications for his hypertension which did remain stable. Patient does have CAD stage III however this did remain stable close his baseline of 1.5.  Patient also has anemia secondary chronic kidney disease. His H&H have remained stable and will monitor. He did have anemia panel showed an iron of 53, TIBC of 170, ferritin of 244, folate of 8.1. Patient remained hemodynamically stable with no signs of active bleeding. Patient will be discharged to home with by mouth antibiotics. This was discussed with patient he does understand they will follow up with Dr. Lajoyce Corners within 2 weeks time. Patient also follow up with his primary care physician within one week's time. He should have continued blood monitoring due to antibiotic coverage. This was discussed with the patient he does understand agree.  Procedures: None  Consultations: Dr. Lajoyce Corners, orthopedics  Discharge Exam: Filed Vitals:   05/17/13 0706  BP: 120/71  Pulse: 66  Temp: 97.7 F (36.5 C)  Resp: 14   Exam  General: Well developed, well nourished, NAD, appears stated age  HEENT: NCAT, PERRLA, mucous membranes moist.  Neck: Supple,  no JVD, no masses  Cardiovascular: S1 S2 auscultated, no rubs, murmurs or gallops. Regular rate and rhythm.  Respiratory: Clear to auscultation bilaterally with equal chest rise  Abdomen: Soft, nontender, nondistended, + bowel  sounds  Extremities: warm dry without cyanosis clubbing. Trace swelling of feet. Right foot currently wrapped  Neuro: AAOx3, cranial nerves grossly intact.  Skin: Without rashes exudates or nodules  Psych: Normal affect and demeanor with intact judgement and insight  Discharge Instructions      Discharge Orders   Future Orders Complete By Expires   Diet - low sodium heart healthy  As directed    Diet Carb Modified  As directed    Discharge instructions  As directed    Comments:     Patient should follow up with his primary care physician in one week of discharge. He should also follow Dr. Lajoyce Corners within 2 weeks of discharge. He should continue taking his medications as prescribed.   Increase activity slowly  As directed        Medication List         acetaminophen 500 MG tablet  Commonly known as:  TYLENOL  Take 1,000 mg by mouth every 6 (six) hours as needed.     ciprofloxacin 500 MG tablet  Commonly known as:  CIPRO  Take 1 tablet (500 mg total) by mouth 2 (two) times daily.     doxycycline 50 MG capsule  Commonly known as:  VIBRAMYCIN  Take 1 capsule (50 mg total) by mouth 2 (two) times daily.     HYDROcodone-acetaminophen 5-325 MG per tablet  Commonly known as:  NORCO/VICODIN  Take 1-2 tablets by mouth every 4 (four) hours as needed for moderate pain.       No Known Allergies Follow-up Information   Follow up with DUDA,MARCUS V, MD In 2 weeks.   Specialty:  Orthopedic Surgery   Contact information:   [address] [place] [address] [phone]       Follow up with Fleet Contras A, MD. Schedule an appointment as soon as possible for a visit in 1 week.   Specialty:  Internal Medicine   Contact information:   [address] [place] [address] [phone]        The results of significant diagnostics from this hospitalization (including imaging, microbiology, ancillary and laboratory) are listed below for reference.    Significant  Diagnostic Studies: Dg Foot Complete Left  05/12/2013   CLINICAL DATA:  Diabetic ulcer  EXAM: LEFT FOOT - COMPLETE 3+ VIEW  COMPARISON:  Prior radiographs 03/22/2013  FINDINGS: Compared to prior, interval amputation of the proximal phalanx of the great toe. Irregularity of the distal phalanx of the little toe is similar compared to prior. No conventional radiographic evidence of osteomyelitis. Soft tissue swelling is noted at the ball of the foot. No acute fracture or malalignment.  IMPRESSION: Soft tissue swelling at the ball of the foot. No conventional radiographic findings to suggest osteomyelitis.  Interval amputation of the proximal phalanx of the great toe compared to 03/22/2013.   Electronically Signed   By: Malachy Moan [name]   On: 05/12/2013 19:48   Dg Foot Complete Right  05/12/2013   CLINICAL DATA:  Diabetic foot ulcer.  Foot pain and swelling.  EXAM: RIGHT FOOT COMPLETE - 3+ VIEW  COMPARISON:  03/22/2013  FINDINGS: Patient has undergone interval amputation of the 1st and 2nd toes. Mild osteolysis in periosteal reaction is seen at the surgical margin of the distal 2nd  metatarsal, suspicious for osteomyelitis. No other sites of osteolysis or periostitis identified. Soft tissue swelling is seen involving the medial forefoot, without evidence of soft tissue gas. No evidence of fracture or dislocation.  IMPRESSION: Findings suspicious for osteomyelitis at the distal 2nd metatarsal amputation site.   Electronically Signed   By: Myles Rosenthal [name]   On: 05/12/2013 19:49    Microbiology: No results found for this or any previous visit (from the past 240 hour(s)).   Labs: Basic Metabolic Panel:  Recent Labs Lab 05/12/13 1733 05/13/13 0526 05/15/13 0300  NA 138 136 136  K 4.3 4.1 3.8  CL 101 102 100  CO2 29 27 27   GLUCOSE 148* 138* 149*  BUN 19 20 19   CREATININE 1.54* 1.53* 1.41*  CALCIUM 9.4 8.8 8.8   Liver Function Tests:  Recent Labs Lab 05/12/13 1733  AST 5  ALT 5  ALKPHOS  77  BILITOT 0.2*  PROT 8.4*  ALBUMIN 3.5   No results found for this basename: LIPASE, AMYLASE,  in the last 168 hours No results found for this basename: AMMONIA,  in the last 168 hours CBC:  Recent Labs Lab 05/12/13 1733 05/13/13 0526 05/14/13 0955 05/15/13 0300  WBC 7.4 6.6 6.8 7.7  HGB 12.0* 10.7* 11.1* 10.3*  HCT 35.2* 30.9* 33.4* 30.4*  MCV 91.2 90.1 91.0 90.2  PLT 243 200 227 206   Cardiac Enzymes: No results found for this basename: CKTOTAL, CKMB, CKMBINDEX, TROPONINI,  in the last 168 hours BNP: BNP (last 3 results) No results found for this basename: PROBNP,  in the last 8760 hours CBG:  Recent Labs Lab 05/16/13 0627 05/16/13 1209 05/16/13 1624 05/16/13 2150 05/17/13 0710  GLUCAP 118* 125* 139* 143* 114*       Signed:  Schon Clark  [facility] 05/17/2013, 10:08 AM

## 2013-05-17 NOTE — Discharge Summary (Signed)
Physician Discharge Summary  [name] MRN:[id number] DOB: 12/30/1975 DOA: 05/12/2013  PCP: AVBUERE,EDWIN A, MD  Admit date: 05/12/2013 Discharge date: 05/17/2013  Time spent: 35 minutes  Recommendations for Outpatient Follow-up:  Patient should follow up with his primary care physician in one week of discharge. He should also follow Dr. Duda within 2 weeks of discharge. He should continue taking his medications as prescribed.  Discharge Diagnoses:  Principal Problem:   Osteomyelitis Active Problems:   Diabetes mellitus   Nausea & vomiting   Renal failure, acute on chronic   Anemia   Essential hypertension, benign   Discharge Condition: Stable  Diet recommendation: Heart healthy and carb modified  Filed Weights   05/12/13 2100  Weight: 106.595 kg (235 lb)    History of present illness:  [name] is a 37 y.o. male who was admitted in October 2 months ago for osteomyelitis and had undergone amputation of the right foot first and second ray and left foot great toe secondary to abscess and osteomyelitis started experiencing swelling and pain of the both feet last 2 days. Patient also noticed mild discharge from the right foot surgical area. Denies any fever chills. X-rays show possible osteomyelitis of the right foot metatarsal area. On-call orthopedic surgeon was consulted by the ER physician and was advised to keep patient n.p.o. past midnight and Dr. Duda, patient's orthopedic surgeon will be seeing patient in a.m. Patient has been started with antibiotics and has been admitted for further management. Patient otherwise denies any chest pain or shortness of breath nausea vomiting abdominal pain diarrhea.   Hospital Course:  This is a 37-year-old male with a history of osteomyelitis, diabetes mellitus, CAD stage II that presented to the emergency department for lower chimney pain. He was not a possible osteomyelitis. Started on IV vancomycin and ciprofloxacin. Dr. Duda, with  orthopedic surgeon was consulted and did recommend 3-5 days of IV antibiotics and then sending the patient home on ciprofloxacin and doxycycline with outpatient followup. Wound care was also consulted for patient's right lower extremity and wound. Patient also has a history of diabetes mellitus type 2. He had not been taking any medications for this in the diet-controlled. His hemoglobin A1c was noted to be 6.92 months ago. Patient was placed on insulin sliding coverage along with CBG monitoring however his blood sugars did remain stable. Patient also has a history of hypertension however on no medications for his hypertension which did remain stable. Patient does have CAD stage III however this did remain stable close his baseline of 1.5.  Patient also has anemia secondary chronic kidney disease. His H&H have remained stable and will monitor. He did have anemia panel showed an iron of 53, TIBC of 170, ferritin of 244, folate of 8.1. Patient remained hemodynamically stable with no signs of active bleeding. Patient will be discharged to home with by mouth antibiotics. This was discussed with patient he does understand they will follow up with Dr. Duda within 2 weeks time. Patient also follow up with his primary care physician within one week's time. He should have continued blood monitoring due to antibiotic coverage. This was discussed with the patient he does understand agree.  Procedures: None  Consultations: Dr. Duda, orthopedics  Discharge Exam: Filed Vitals:   05/17/13 0706  BP: 120/71  Pulse: 66  Temp: 97.7 F (36.5 C)  Resp: 14   Exam  General: Well developed, well nourished, NAD, appears stated age  HEENT: NCAT, PERRLA, mucous membranes moist.  Neck: Supple,   no JVD, no masses  Cardiovascular: S1 S2 auscultated, no rubs, murmurs or gallops. Regular rate and rhythm.  Respiratory: Clear to auscultation bilaterally with equal chest rise  Abdomen: Soft, nontender, nondistended, + bowel  sounds  Extremities: warm dry without cyanosis clubbing. Trace swelling of feet. Right foot currently wrapped  Neuro: AAOx3, cranial nerves grossly intact.  Skin: Without rashes exudates or nodules  Psych: Normal affect and demeanor with intact judgement and insight  Discharge Instructions      Discharge Orders   Future Orders Complete By Expires   Diet - low sodium heart healthy  As directed    Diet Carb Modified  As directed    Discharge instructions  As directed    Comments:     Patient should follow up with his primary care physician in one week of discharge. He should also follow Dr. Duda within 2 weeks of discharge. He should continue taking his medications as prescribed.   Increase activity slowly  As directed        Medication List         acetaminophen 500 MG tablet  Commonly known as:  TYLENOL  Take 1,000 mg by mouth every 6 (six) hours as needed.     ciprofloxacin 500 MG tablet  Commonly known as:  CIPRO  Take 1 tablet (500 mg total) by mouth 2 (two) times daily.     doxycycline 50 MG capsule  Commonly known as:  VIBRAMYCIN  Take 1 capsule (50 mg total) by mouth 2 (two) times daily.     HYDROcodone-acetaminophen 5-325 MG per tablet  Commonly known as:  NORCO/VICODIN  Take 1-2 tablets by mouth every 4 (four) hours as needed for moderate pain.       No Known Allergies Follow-up Information   Follow up with DUDA,MARCUS V, MD In 2 weeks.   Specialty:  Orthopedic Surgery   Contact information:   [address] [place] [address] [phone]       Follow up with AVBUERE,EDWIN A, MD. Schedule an appointment as soon as possible for a visit in 1 week.   Specialty:  Internal Medicine   Contact information:   [address] [place] [address] [phone]        The results of significant diagnostics from this hospitalization (including imaging, microbiology, ancillary and laboratory) are listed below for reference.    Significant  Diagnostic Studies: Dg Foot Complete Left  05/12/2013   CLINICAL DATA:  Diabetic ulcer  EXAM: LEFT FOOT - COMPLETE 3+ VIEW  COMPARISON:  Prior radiographs 03/22/2013  FINDINGS: Compared to prior, interval amputation of the proximal phalanx of the great toe. Irregularity of the distal phalanx of the little toe is similar compared to prior. No conventional radiographic evidence of osteomyelitis. Soft tissue swelling is noted at the ball of the foot. No acute fracture or malalignment.  IMPRESSION: Soft tissue swelling at the ball of the foot. No conventional radiographic findings to suggest osteomyelitis.  Interval amputation of the proximal phalanx of the great toe compared to 03/22/2013.   Electronically Signed   By: Heath  McCullough [name]   On: 05/12/2013 19:48   Dg Foot Complete Right  05/12/2013   CLINICAL DATA:  Diabetic foot ulcer.  Foot pain and swelling.  EXAM: RIGHT FOOT COMPLETE - 3+ VIEW  COMPARISON:  03/22/2013  FINDINGS: Patient has undergone interval amputation of the 1st and 2nd toes. Mild osteolysis in periosteal reaction is seen at the surgical margin of the distal 2nd   metatarsal, suspicious for osteomyelitis. No other sites of osteolysis or periostitis identified. Soft tissue swelling is seen involving the medial forefoot, without evidence of soft tissue gas. No evidence of fracture or dislocation.  IMPRESSION: Findings suspicious for osteomyelitis at the distal 2nd metatarsal amputation site.   Electronically Signed   By: John  Stahl [name]   On: 05/12/2013 19:49    Microbiology: No results found for this or any previous visit (from the past 240 hour(s)).   Labs: Basic Metabolic Panel:  Recent Labs Lab 05/12/13 1733 05/13/13 0526 05/15/13 0300  NA 138 136 136  K 4.3 4.1 3.8  CL 101 102 100  CO2 29 27 27  GLUCOSE 148* 138* 149*  BUN 19 20 19  CREATININE 1.54* 1.53* 1.41*  CALCIUM 9.4 8.8 8.8   Liver Function Tests:  Recent Labs Lab 05/12/13 1733  AST 5  ALT 5  ALKPHOS  77  BILITOT 0.2*  PROT 8.4*  ALBUMIN 3.5   No results found for this basename: LIPASE, AMYLASE,  in the last 168 hours No results found for this basename: AMMONIA,  in the last 168 hours CBC:  Recent Labs Lab 05/12/13 1733 05/13/13 0526 05/14/13 0955 05/15/13 0300  WBC 7.4 6.6 6.8 7.7  HGB 12.0* 10.7* 11.1* 10.3*  HCT 35.2* 30.9* 33.4* 30.4*  MCV 91.2 90.1 91.0 90.2  PLT 243 200 227 206   Cardiac Enzymes: No results found for this basename: CKTOTAL, CKMB, CKMBINDEX, TROPONINI,  in the last 168 hours BNP: BNP (last 3 results) No results found for this basename: PROBNP,  in the last 8760 hours CBG:  Recent Labs Lab 05/16/13 0627 05/16/13 1209 05/16/13 1624 05/16/13 2150 05/17/13 0710  GLUCAP 118* 125* 139* 143* 114*       Signed:  Atarah Cadogan  [facility] 05/17/2013, 10:08 AM   

## 2013-05-17 NOTE — Progress Notes (Signed)
RN reviewed discharge instructions with patient. Al questions answered. Prescriptions given.   Patient wheeled down with volunteer services.

## 2013-08-26 ENCOUNTER — Emergency Department (HOSPITAL_COMMUNITY): Payer: Medicaid Other

## 2013-08-26 ENCOUNTER — Emergency Department (HOSPITAL_COMMUNITY)
Admission: EM | Admit: 2013-08-26 | Discharge: 2013-08-26 | Disposition: A | Discharge status: Home / Self Care | Payer: Medicaid Other | Attending: Emergency Medicine | Admitting: Emergency Medicine

## 2013-08-26 ENCOUNTER — Encounter (HOSPITAL_COMMUNITY): Payer: Self-pay | Admitting: Emergency Medicine

## 2013-08-26 DIAGNOSIS — Z792 Long term (current) use of antibiotics: Secondary | ICD-10-CM | POA: Insufficient documentation

## 2013-08-26 DIAGNOSIS — K449 Diaphragmatic hernia without obstruction or gangrene: Secondary | ICD-10-CM

## 2013-08-26 DIAGNOSIS — F17200 Nicotine dependence, unspecified, uncomplicated: Secondary | ICD-10-CM | POA: Insufficient documentation

## 2013-08-26 DIAGNOSIS — Z862 Personal history of diseases of the blood and blood-forming organs and certain disorders involving the immune mechanism: Secondary | ICD-10-CM | POA: Insufficient documentation

## 2013-08-26 DIAGNOSIS — R5383 Other fatigue: Secondary | ICD-10-CM

## 2013-08-26 DIAGNOSIS — R112 Nausea with vomiting, unspecified: Secondary | ICD-10-CM | POA: Insufficient documentation

## 2013-08-26 DIAGNOSIS — E119 Type 2 diabetes mellitus without complications: Secondary | ICD-10-CM | POA: Insufficient documentation

## 2013-08-26 DIAGNOSIS — Z9089 Acquired absence of other organs: Secondary | ICD-10-CM | POA: Insufficient documentation

## 2013-08-26 DIAGNOSIS — R5381 Other malaise: Secondary | ICD-10-CM | POA: Insufficient documentation

## 2013-08-26 DIAGNOSIS — R197 Diarrhea, unspecified: Secondary | ICD-10-CM | POA: Insufficient documentation

## 2013-08-26 DIAGNOSIS — Z8739 Personal history of other diseases of the musculoskeletal system and connective tissue: Secondary | ICD-10-CM | POA: Insufficient documentation

## 2013-08-26 DIAGNOSIS — R109 Unspecified abdominal pain: Secondary | ICD-10-CM

## 2013-08-26 DIAGNOSIS — I10 Essential (primary) hypertension: Secondary | ICD-10-CM | POA: Insufficient documentation

## 2013-08-26 DIAGNOSIS — R1013 Epigastric pain: Secondary | ICD-10-CM | POA: Insufficient documentation

## 2013-08-26 DIAGNOSIS — Z87448 Personal history of other diseases of urinary system: Secondary | ICD-10-CM | POA: Insufficient documentation

## 2013-08-26 LAB — COMPREHENSIVE METABOLIC PANEL
ALK PHOS: 96 U/L (ref 39–117)
ALT: 22 U/L (ref 0–53)
AST: 33 U/L (ref 0–37)
Albumin: 3.8 g/dL (ref 3.5–5.2)
BUN: 23 mg/dL (ref 6–23)
CO2: 22 mEq/L (ref 19–32)
Calcium: 10 mg/dL (ref 8.4–10.5)
Chloride: 100 mEq/L (ref 96–112)
Creatinine, Ser: 1.53 mg/dL — ABNORMAL HIGH (ref 0.50–1.35)
GFR calc non Af Amer: 57 mL/min — ABNORMAL LOW (ref >90)
GFR, EST AFRICAN AMERICAN: 66 mL/min — AB (ref >90)
GLUCOSE: 190 mg/dL — AB (ref 70–99)
POTASSIUM: 5.3 meq/L (ref 3.7–5.3)
SODIUM: 139 meq/L (ref 137–147)
Total Bilirubin: 0.3 mg/dL (ref 0.3–1.2)
Total Protein: 8.7 g/dL — ABNORMAL HIGH (ref 6.0–8.3)

## 2013-08-26 LAB — URINALYSIS, ROUTINE W REFLEX MICROSCOPIC
Bilirubin Urine: NEGATIVE
Glucose, UA: NEGATIVE mg/dL
Hgb urine dipstick: NEGATIVE
Ketones, ur: NEGATIVE mg/dL
Leukocytes, UA: NEGATIVE
NITRITE: NEGATIVE
Specific Gravity, Urine: 1.021 (ref 1.005–1.030)
UROBILINOGEN UA: 1 mg/dL (ref 0.0–1.0)
pH: 7.5 (ref 5.0–8.0)

## 2013-08-26 LAB — CBC WITH DIFFERENTIAL/PLATELET
BASOS PCT: 0 % (ref 0–1)
Basophils Absolute: 0 10*3/uL (ref 0.0–0.1)
Eosinophils Absolute: 0.1 10*3/uL (ref 0.0–0.7)
Eosinophils Relative: 1 % (ref 0–5)
HCT: 36.6 % — ABNORMAL LOW (ref 39.0–52.0)
Hemoglobin: 12.4 g/dL — ABNORMAL LOW (ref 13.0–17.0)
LYMPHS PCT: 15 % (ref 12–46)
Lymphs Abs: 1.6 10*3/uL (ref 0.7–4.0)
MCH: 29.3 pg (ref 26.0–34.0)
MCHC: 33.9 g/dL (ref 30.0–36.0)
MCV: 86.5 fL (ref 78.0–100.0)
MONOS PCT: 5 % (ref 3–12)
Monocytes Absolute: 0.5 10*3/uL (ref 0.1–1.0)
NEUTROS ABS: 8.3 10*3/uL — AB (ref 1.7–7.7)
NEUTROS PCT: 79 % — AB (ref 43–77)
Platelets: 204 10*3/uL (ref 150–400)
RBC: 4.23 MIL/uL (ref 4.22–5.81)
RDW: 14.2 % (ref 11.5–15.5)
WBC: 10.5 10*3/uL (ref 4.0–10.5)

## 2013-08-26 LAB — CBG MONITORING, ED: Glucose-Capillary: 155 mg/dL — ABNORMAL HIGH (ref 70–99)

## 2013-08-26 LAB — LIPASE, BLOOD: Lipase: 27 U/L (ref 11–59)

## 2013-08-26 LAB — URINE MICROSCOPIC-ADD ON

## 2013-08-26 MED ORDER — IOHEXOL 300 MG/ML  SOLN
100.0000 mL | Freq: Once | INTRAMUSCULAR | Status: AC | PRN
  Administered 2013-08-26: 100 mL via INTRAVENOUS

## 2013-08-26 MED ORDER — SODIUM CHLORIDE 0.9 % IV BOLUS (SEPSIS)
1000.0000 mL | Freq: Once | INTRAVENOUS | Status: AC
  Administered 2013-08-26: 1000 mL via INTRAVENOUS

## 2013-08-26 MED ORDER — PROMETHAZINE HCL 25 MG PO TABS: 25.0000 mg | ORAL_TABLET | Freq: Four times a day (QID) | ORAL | Status: DC | PRN

## 2013-08-26 MED ORDER — HYDROCODONE-ACETAMINOPHEN 5-325 MG PO TABS: 1.0000 | ORAL_TABLET | ORAL | Status: DC | PRN

## 2013-08-26 MED ORDER — FAMOTIDINE 20 MG PO TABS: 20.0000 mg | ORAL_TABLET | Freq: Two times a day (BID) | ORAL | Status: DC

## 2013-08-26 MED ORDER — HYDROMORPHONE HCL PF 1 MG/ML IJ SOLN
1.0000 mg | Freq: Once | INTRAMUSCULAR | Status: AC
  Administered 2013-08-26: 1 mg via INTRAVENOUS
  Filled 2013-08-26: qty 1

## 2013-08-26 MED ORDER — GI COCKTAIL ~~LOC~~
30.0000 mL | Freq: Once | ORAL | Status: AC
  Administered 2013-08-26: 30 mL via ORAL
  Filled 2013-08-26: qty 30

## 2013-08-26 MED ORDER — HYDROMORPHONE HCL PF 1 MG/ML IJ SOLN
INTRAMUSCULAR | Status: AC
  Filled 2013-08-26: qty 1

## 2013-08-26 MED ORDER — ONDANSETRON HCL 4 MG/2ML IJ SOLN
4.0000 mg | Freq: Once | INTRAMUSCULAR | Status: AC
  Administered 2013-08-26: 4 mg via INTRAVENOUS
  Filled 2013-08-26: qty 2

## 2013-08-26 MED ORDER — OMEPRAZOLE 20 MG PO CPDR: 20.0000 mg | DELAYED_RELEASE_CAPSULE | Freq: Every day | ORAL | Status: DC

## 2013-08-26 MED ORDER — MORPHINE SULFATE 4 MG/ML IJ SOLN
6.0000 mg | Freq: Once | INTRAMUSCULAR | Status: AC
  Administered 2013-08-26: 6 mg via INTRAVENOUS
  Filled 2013-08-26: qty 2

## 2013-08-26 NOTE — Discharge Instructions (Signed)
Start taking pepcid and prilosec for your abdominal pain.  Norco for pain. Phenergan for nausea. Follow up with primary care doctor and gastroenterologist. Return if worsening.    Abdominal Pain, Adult Many things can cause abdominal pain. Usually, abdominal pain is not caused by a disease and will improve without treatment. It can often be observed and treated at home. Your health care provider will do a physical exam and possibly order blood tests and X-rays to help determine the seriousness of your pain. However, in many cases, more time must pass before a clear cause of the pain can be found. Before that point, your health care provider may not know if you need more testing or further treatment. HOME CARE INSTRUCTIONS  Monitor your abdominal pain for any changes. The following actions may help to alleviate any discomfort you are experiencing:  Only take over-the-counter or prescription medicines as directed by your health care provider.  Do not take laxatives unless directed to do so by your health care provider.  Try a clear liquid diet (broth, tea, or water) as directed by your health care provider. Slowly move to a bland diet as tolerated. SEEK MEDICAL CARE IF:  You have unexplained abdominal pain.  You have abdominal pain associated with nausea or diarrhea.  You have pain when you urinate or have a bowel movement.  You experience abdominal pain that wakes you in the night.  You have abdominal pain that is worsened or improved by eating food.  You have abdominal pain that is worsened with eating fatty foods. SEEK IMMEDIATE MEDICAL CARE IF:   Your pain does not go away within 2 hours.  You have a fever.  You keep throwing up (vomiting).  Your pain is felt only in portions of the abdomen, such as the right side or the left lower portion of the abdomen.  You pass bloody or black tarry stools. MAKE SURE YOU:  Understand these instructions.   Will watch your condition.    Will get help right away if you are not doing well or get worse.  Document Released: 03/05/2005 Document Revised: 03/16/2013 Document Reviewed: 02/02/2013 [facility] Patient Information 2014 [employer], [place].   Hiatal Hernia A hiatal hernia occurs when a part of the stomach slides above the diaphragm. The diaphragm is the thin muscle separating the belly (abdomen) from the chest. A hiatal hernia can be something you are born with or develop over time. Hiatal hernias may allow stomach acid to flow back into your esophagus, the tube which carries food from your mouth to your stomach. If this acid causes problems it is called GERD (gastro-esophageal reflux disease).  SYMPTOMS  Common symptoms of GERD are heartburn (burning in your chest). This is worse when lying down or bending over. It may also cause belching and indigestion. Some of the things which make GERD worse are:  Increased weight pushes on stomach making acid rise more easily.  Smoking markedly increases acid production.  Alcohol decreases lower esophageal sphincter pressure (valve between stomach and esophagus), allowing acid from stomach into esophagus.  Late evening meals and going to bed with a full stomach increases pressure.  Anything that causes an increase in acid production.  Lower esophageal sphincter incompetence. DIAGNOSIS  Hiatal hernia is often diagnosed with x-rays of your stomach and small bowel. This is called an UGI (upper gastrointestinal x-ray). Sometimes a gastroscopic procedure is done. This is a procedure where your caregiver uses a flexible instrument to look into the stomach and small  bowel. HOME CARE INSTRUCTIONS   Try to achieve and maintain an ideal body weight.  Avoid drinking alcoholic beverages.  Stop smoking.  Put the head of your bed on 4 to 6 inch blocks. This will keep your head and esophagus higher than your stomach. If you cannot use blocks, sleep with several pillows under your head and  shoulders.  Over-the-counter medications will decrease acid production. Your caregiver can also prescribe medications for this. Take as directed.  1/2 to 1 teaspoon of an antacid taken every hour while awake, with meals and at bedtime, will neutralize acid.  Do not take aspirin, ibuprofen (Advil or Motrin), or other nonsteroidal anti-inflammatory drugs.  Do not wear tight clothing around your chest or stomach.  Eat smaller meals and eat more frequently. This keeps your stomach from getting too full. Eat slowly.  Do not lie down for 2 or 3 hours after eating. Do not eat or drink anything 1 to 2 hours before going to bed.  Avoid caffeine beverages (colas, coffee, cocoa, tea), fatty foods, citrus fruits and all other foods and drinks that contain acid and that seem to increase the problems.  Avoid bending over, especially after eating. Also avoid straining during bowel movements or when urinating or lifting things. Anything that increases the pressure in your belly increases the amount of acid that may be pushed up into your esophagus. SEEK IMMEDIATE MEDICAL CARE IF:  There is change in location (pain in arms, neck, jaw, teeth or back) of your pain, or the pain is getting worse.  You also experience nausea, vomiting, sweating (diaphoresis), or shortness of breath.  You develop continual vomiting, vomit blood or coffee ground material, have bright red blood in your stools, or have black tarry stools. Some of these symptoms could signal other problems such as heart disease. MAKE SURE YOU:   Understand these instructions.  Monitor your condition.  Contact your caregiver if you are not doing well or are getting worse. Document Released: 08/16/2003 Document Revised: 08/18/2011 Document Reviewed: 05/26/2005 [facility] Patient Information 2014 [place], [place].

## 2013-08-26 NOTE — ED Notes (Signed)
Per EMS: Pt c/o multiple episodes of N/V since 7am this morning. No fever. Pt states started having generalized abd pain last night after eating pizza and wings. No BM today. Unable to keep down any food or fluids. Pt had one bout of emesis en route to hospital.

## 2013-08-26 NOTE — ED Notes (Signed)
Pt still unable to void

## 2013-08-26 NOTE — ED Notes (Signed)
Bed: WA16 Expected date:  Expected time:  Means of arrival:  Comments: EMS-vomiting 

## 2013-08-26 NOTE — ED Notes (Addendum)
Pt states he is still unable to give urine sample 

## 2013-08-26 NOTE — ED Notes (Signed)
Pt aware of need for urine sample, states unable to urinate at this time 

## 2013-08-26 NOTE — ED Provider Notes (Signed)
CSN: [id number]     Arrival date & time 08/26/13  1724 History   First MD Initiated Contact with Patient 08/26/13 1727     Chief Complaint  Patient presents with  . Emesis     (Consider location/radiation/quality/duration/timing/severity/associated sxs/prior Treatment) HPI [name] is a 38 y.o. male who presents to ED with complaint of nausea, vomiting, abdominal pain. Pt has chronic abdominal pain, hx of pancreatitis, states this episode is worse than his usual. Unable to keep anything down. No diarrhea. No bowel movement today. States tried taking pepto bismol with no relief. States unable to keep any fluids down. Denies fever, chills. Denies back pain or urinary symptoms. Pt has hx of diabetes, recently started on metformin again. States ate pizza and wings yesterday.  Past Medical History  Diagnosis Date  . Hypertension   . Diabetes mellitus     type 2 iddm x 18 yrs  . Vascular disease     poor circulation to left foot  . Acute osteomyelitis, ankle and foot 07/30/2010    Qualifier: Diagnosis of  By: Daiva Eves MD, Remi Haggard     Past Surgical History  Procedure Laterality Date  . Cholecystectomy    . Toe amputation  2012    left foot; great toe and second toe  . Amputation  04/15/2011    Procedure: AMPUTATION RAY;  Surgeon: Nadara Mustard, MD;  Location: [facility] OR;  Service: Orthopedics;  Laterality: Left;  left foot third toe amputation and MPP joint and gastroc resection VS. achilles lengthing   . Amputation  04/25/2011    Procedure: AMPUTATION DIGIT;  Surgeon: Nadara Mustard, MD;  Location: [facility] OR;  Service: Orthopedics;  Laterality: Left;  Left foot 3rd toe amputation MTP joint, Gastroc Recession  Achilles Lengthening   . Amputation Bilateral 03/25/2013    Procedure: AMPUTATION RAY;  Surgeon: Nadara Mustard, MD;  Location: [facility] OR;  Service: Orthopedics;  Laterality: Bilateral;  Left Great Toe Amputation at  MTP Joint, Right 1st and 2nd Ray Amputation    No family history on  file. History  Substance Use Topics  . Smoking status: Current Every Day Smoker -- 0.25 packs/day for 1 years    Types: Cigarettes  . Smokeless tobacco: Never Used  . Alcohol Use: No    Review of Systems  Constitutional: Negative for fever and chills.  Respiratory: Negative for cough, chest tightness and shortness of breath.   Cardiovascular: Negative for chest pain, palpitations and leg swelling.  Gastrointestinal: Positive for nausea, vomiting, abdominal pain and diarrhea. Negative for abdominal distention.  Genitourinary: Negative for dysuria, urgency, frequency and hematuria.  Musculoskeletal: Negative for arthralgias, myalgias, neck pain and neck stiffness.  Skin: Negative for rash.  Allergic/Immunologic: Negative for immunocompromised state.  Neurological: Positive for weakness. Negative for dizziness, light-headedness, numbness and headaches.      Allergies  Review of patient's allergies indicates no known allergies.  Home Medications   Current Outpatient Rx  Name  Route  Sig  Dispense  Refill  . acetaminophen (TYLENOL) 500 MG tablet   Oral   Take 1,000 mg by mouth every 6 (six) hours as needed.         . ciprofloxacin (CIPRO) 500 MG tablet   Oral   Take 1 tablet (500 mg total) by mouth 2 (two) times daily.   50 tablet   0   . doxycycline (VIBRAMYCIN) 50 MG capsule   Oral   Take 1 capsule (50 mg total) by mouth 2 (two)  times daily.   50 capsule   0   . HYDROcodone-acetaminophen (NORCO/VICODIN) 5-325 MG per tablet   Oral   Take 1-2 tablets by mouth every 4 (four) hours as needed for moderate pain.   30 tablet   0    BP 159/86  Pulse 81  Temp(Src) 98.3 F (36.8 C) (Oral)  Resp 18  SpO2 98% Physical Exam  Nursing note and vitals reviewed. Constitutional: He appears well-developed and well-nourished. No distress.  HENT:  Head: Normocephalic and atraumatic.  Eyes: Conjunctivae are normal.  Neck: Neck supple.  Cardiovascular: Normal rate, regular  rhythm and normal heart sounds.   Pulmonary/Chest: Effort normal. No respiratory distress. He has no wheezes. He has no rales.  Abdominal: Soft. Bowel sounds are normal. He exhibits no distension. There is tenderness. There is no rebound.  Epigastric tenderness  Musculoskeletal: He exhibits no edema.  Neurological: He is alert.  Skin: Skin is warm and dry.    ED Course  Procedures (including critical care time) Labs Review Labs Reviewed  CBC WITH DIFFERENTIAL - Abnormal; Notable for the following:    Hemoglobin 12.4 (*)    HCT 36.6 (*)    Neutrophils Relative % 79 (*)    Neutro Abs 8.3 (*)    All other components within normal limits  COMPREHENSIVE METABOLIC PANEL - Abnormal; Notable for the following:    Glucose, Bld 190 (*)    Creatinine, Ser 1.53 (*)    Total Protein 8.7 (*)    GFR calc non Af Amer 57 (*)    GFR calc Af Amer 66 (*)    All other components within normal limits  URINALYSIS, ROUTINE W REFLEX MICROSCOPIC - Abnormal; Notable for the following:    Protein, ur >300 (*)    All other components within normal limits  CBG MONITORING, ED - Abnormal; Notable for the following:    Glucose-Capillary 155 (*)    All other components within normal limits  URINE CULTURE  LIPASE, BLOOD  URINE MICROSCOPIC-ADD ON   Imaging Review Ct Abdomen Pelvis W Contrast  08/26/2013   CLINICAL DATA:  Nausea, vomiting, generalized abdominal pain, history hypertension, diabetes, remote cholecystectomy in 2012.  EXAM: CT ABDOMEN AND PELVIS WITH CONTRAST  TECHNIQUE: Multidetector CT imaging of the abdomen and pelvis was performed using the standard protocol following bolus administration of intravenous contrast. Sagittal and coronal MPR images reconstructed from axial data set.  CONTRAST:  OMNIPAQUE IOHEXOL 300 MG/ML SOLN IV. Dilute oral contrast.  COMPARISON:  09/14/2012  FINDINGS: Dependent atelectasis right lower lobe.  Post cholecystectomy.  Ptotic and malrotated right kidney.  Tiny  umbilical hernia containing fat.  Liver, spleen, pancreas, kidneys, and adrenal glands otherwise normal appearance.  Normal appendix.  Minimal bladder wall thickening similar to previous exam.  Small hiatal hernia with wall thickening.  Stomach and bowel loops otherwise normal appearance.  No mass, adenopathy, free fluid, or inflammatory process.  Degenerative disc disease changes at L4-L5 and L5-S1.  IMPRESSION: Ptotic and malrotated right kidney.  Small hiatal hernia with questionable wall thickening of the distal esophagus and hernia sac ; consider routine follow-up endoscopy or upper GI exam to exclude mass, gastritis and distal esophagitis.  No other definite intra-abdominal or intrapelvic abnormalities.   Electronically Signed   By: Ulyses Southward [name]   On: 08/26/2013 20:45   Dg Abd Acute W/chest  08/26/2013   CLINICAL DATA:  Abdominal pain and vomiting  EXAM: ACUTE ABDOMEN SERIES (ABDOMEN 2 VIEW & CHEST 1  VIEW)  COMPARISON:  DG CHEST 2 VIEW dated 09/16/2012  FINDINGS: Normal cardiac silhouette. Lungs are clear. No free air beneath hemidiaphragms.  No dilated loops of large or small bowel. Moderate volume stool throughout the colon. Stool and gas in the rectum. No pathologic calcifications. Prior cholecystectomy.  IMPRESSION: 1.  No acute cardiopulmonary process.  . 2. No bowel obstruction or intraperitoneal free air.   Electronically Signed   By: Genevive BiStewart  Edmunds [name]   On: 08/26/2013 18:50     EKG Interpretation None      MDM   Final diagnoses:  Nausea & vomiting  Abdominal pain  Hiatal hernia   Pt with abdominal pain, hx of pancreatitis, feels the same. Will get labs, xray.   Pt continues to have pain, will get CT abd/pelvis to rule out obstriction vs colitis.   9:05 PM Pt feeling better. CT showing hiatal hernia with questionable wall thickening of the distal esophagus and hernia sac. Pt will need further follow up with GI. Will start on pepcid and prilosec. Will give a referral to GI.    Filed Vitals:   08/26/13 1726 08/26/13 1952 08/26/13 2223  BP: 159/86 123/71 113/61  Pulse: 81 89 88  Temp: 98.3 F (36.8 C) 97.9 F (36.6 C)   TempSrc: Oral Oral   Resp: 18 18 16   SpO2: 98% 92% 95%     Hero Mccathern A Emali Heyward, PA-C 08/27/13 0031

## 2013-08-26 NOTE — ED Notes (Signed)
Pt given urinal and made aware of need for urine specimen 

## 2013-08-26 NOTE — ED Notes (Signed)
Pt out of room for x-ray 

## 2013-08-28 ENCOUNTER — Emergency Department (HOSPITAL_COMMUNITY): Payer: Medicaid Other

## 2013-08-28 ENCOUNTER — Emergency Department (HOSPITAL_COMMUNITY)
Admission: EM | Admit: 2013-08-28 | Discharge: 2013-08-28 | Disposition: A | Discharge status: Home / Self Care | Payer: Medicaid Other | Attending: Emergency Medicine | Admitting: Emergency Medicine

## 2013-08-28 ENCOUNTER — Encounter (HOSPITAL_COMMUNITY): Payer: Self-pay | Admitting: Emergency Medicine

## 2013-08-28 DIAGNOSIS — F17200 Nicotine dependence, unspecified, uncomplicated: Secondary | ICD-10-CM | POA: Insufficient documentation

## 2013-08-28 DIAGNOSIS — M5137 Other intervertebral disc degeneration, lumbosacral region: Secondary | ICD-10-CM | POA: Insufficient documentation

## 2013-08-28 DIAGNOSIS — R112 Nausea with vomiting, unspecified: Secondary | ICD-10-CM

## 2013-08-28 DIAGNOSIS — N289 Disorder of kidney and ureter, unspecified: Secondary | ICD-10-CM | POA: Insufficient documentation

## 2013-08-28 DIAGNOSIS — E119 Type 2 diabetes mellitus without complications: Secondary | ICD-10-CM | POA: Insufficient documentation

## 2013-08-28 DIAGNOSIS — S98119A Complete traumatic amputation of unspecified great toe, initial encounter: Secondary | ICD-10-CM | POA: Insufficient documentation

## 2013-08-28 DIAGNOSIS — Z79899 Other long term (current) drug therapy: Secondary | ICD-10-CM | POA: Insufficient documentation

## 2013-08-28 DIAGNOSIS — R630 Anorexia: Secondary | ICD-10-CM | POA: Insufficient documentation

## 2013-08-28 DIAGNOSIS — M51379 Other intervertebral disc degeneration, lumbosacral region without mention of lumbar back pain or lower extremity pain: Secondary | ICD-10-CM | POA: Insufficient documentation

## 2013-08-28 DIAGNOSIS — I129 Hypertensive chronic kidney disease with stage 1 through stage 4 chronic kidney disease, or unspecified chronic kidney disease: Secondary | ICD-10-CM | POA: Insufficient documentation

## 2013-08-28 DIAGNOSIS — R1084 Generalized abdominal pain: Secondary | ICD-10-CM | POA: Insufficient documentation

## 2013-08-28 DIAGNOSIS — R109 Unspecified abdominal pain: Secondary | ICD-10-CM

## 2013-08-28 DIAGNOSIS — S98139A Complete traumatic amputation of one unspecified lesser toe, initial encounter: Secondary | ICD-10-CM | POA: Insufficient documentation

## 2013-08-28 HISTORY — DX: Acute pancreatitis without necrosis or infection, unspecified: K85.90

## 2013-08-28 HISTORY — DX: Other intervertebral disc degeneration, lumbar region: M51.36

## 2013-08-28 HISTORY — DX: Anemia, unspecified: D64.9

## 2013-08-28 HISTORY — DX: Diaphragmatic hernia without obstruction or gangrene: K44.9

## 2013-08-28 HISTORY — DX: Disorder of kidney and ureter, unspecified: N28.9

## 2013-08-28 HISTORY — DX: Other intervertebral disc degeneration, lumbar region without mention of lumbar back pain or lower extremity pain: M51.369

## 2013-08-28 LAB — CBC WITH DIFFERENTIAL/PLATELET
Basophils Absolute: 0 10*3/uL (ref 0.0–0.1)
Basophils Relative: 0 % (ref 0–1)
Eosinophils Absolute: 0.1 10*3/uL (ref 0.0–0.7)
Eosinophils Relative: 1 % (ref 0–5)
HEMATOCRIT: 34 % — AB (ref 39.0–52.0)
HEMOGLOBIN: 11.5 g/dL — AB (ref 13.0–17.0)
LYMPHS PCT: 14 % (ref 12–46)
Lymphs Abs: 1.6 10*3/uL (ref 0.7–4.0)
MCH: 29.5 pg (ref 26.0–34.0)
MCHC: 33.8 g/dL (ref 30.0–36.0)
MCV: 87.2 fL (ref 78.0–100.0)
MONOS PCT: 5 % (ref 3–12)
Monocytes Absolute: 0.6 10*3/uL (ref 0.1–1.0)
NEUTROS ABS: 8.8 10*3/uL — AB (ref 1.7–7.7)
NEUTROS PCT: 80 % — AB (ref 43–77)
Platelets: 203 10*3/uL (ref 150–400)
RBC: 3.9 MIL/uL — AB (ref 4.22–5.81)
RDW: 14.3 % (ref 11.5–15.5)
WBC: 11.1 10*3/uL — AB (ref 4.0–10.5)

## 2013-08-28 LAB — URINALYSIS, ROUTINE W REFLEX MICROSCOPIC
Bilirubin Urine: NEGATIVE
GLUCOSE, UA: NEGATIVE mg/dL
KETONES UR: NEGATIVE mg/dL
LEUKOCYTES UA: NEGATIVE
Nitrite: NEGATIVE
Protein, ur: >300 mg/dL — AB
SPECIFIC GRAVITY, URINE: 1.026 (ref 1.005–1.030)
Urobilinogen, UA: 1 mg/dL (ref 0.0–1.0)
pH: 6 (ref 5.0–8.0)

## 2013-08-28 LAB — COMPREHENSIVE METABOLIC PANEL
ALK PHOS: 90 U/L (ref 39–117)
ALT: 15 U/L (ref 0–53)
AST: 17 U/L (ref 0–37)
Albumin: 3.6 g/dL (ref 3.5–5.2)
BILIRUBIN TOTAL: 0.6 mg/dL (ref 0.3–1.2)
BUN: 18 mg/dL (ref 6–23)
CHLORIDE: 99 meq/L (ref 96–112)
CO2: 26 meq/L (ref 19–32)
CREATININE: 1.62 mg/dL — AB (ref 0.50–1.35)
Calcium: 9.6 mg/dL (ref 8.4–10.5)
GFR calc Af Amer: 62 mL/min — ABNORMAL LOW (ref >90)
GFR, EST NON AFRICAN AMERICAN: 53 mL/min — AB (ref >90)
Glucose, Bld: 213 mg/dL — ABNORMAL HIGH (ref 70–99)
POTASSIUM: 4 meq/L (ref 3.7–5.3)
Sodium: 139 mEq/L (ref 137–147)
Total Protein: 8.1 g/dL (ref 6.0–8.3)

## 2013-08-28 LAB — URINE MICROSCOPIC-ADD ON

## 2013-08-28 LAB — URINE CULTURE
Colony Count: NO GROWTH
Culture: NO GROWTH

## 2013-08-28 LAB — LIPASE, BLOOD: Lipase: 21 U/L (ref 11–59)

## 2013-08-28 MED ORDER — ONDANSETRON HCL 4 MG PO TABS: 4.0000 mg | ORAL_TABLET | Freq: Three times a day (TID) | ORAL | Status: DC | PRN

## 2013-08-28 MED ORDER — ONDANSETRON HCL 4 MG/2ML IJ SOLN
4.0000 mg | INTRAMUSCULAR | Status: AC | PRN
  Administered 2013-08-28 (×2): 4 mg via INTRAVENOUS
  Filled 2013-08-28 (×2): qty 2

## 2013-08-28 MED ORDER — PANTOPRAZOLE SODIUM 40 MG IV SOLR
40.0000 mg | Freq: Once | INTRAVENOUS | Status: AC
  Administered 2013-08-28: 40 mg via INTRAVENOUS
  Filled 2013-08-28: qty 40

## 2013-08-28 MED ORDER — SODIUM CHLORIDE 0.9 % IV BOLUS (SEPSIS)
500.0000 mL | Freq: Once | INTRAVENOUS | Status: AC
  Administered 2013-08-28: 500 mL via INTRAVENOUS

## 2013-08-28 MED ORDER — FAMOTIDINE IN NACL 20-0.9 MG/50ML-% IV SOLN
20.0000 mg | Freq: Once | INTRAVENOUS | Status: AC
  Administered 2013-08-28: 20 mg via INTRAVENOUS
  Filled 2013-08-28: qty 50

## 2013-08-28 MED ORDER — SODIUM CHLORIDE 0.9 % IV SOLN
INTRAVENOUS | Status: DC
  Administered 2013-08-28: 10:00:00 via INTRAVENOUS

## 2013-08-28 MED ORDER — MORPHINE SULFATE 4 MG/ML IJ SOLN
4.0000 mg | INTRAMUSCULAR | Status: AC | PRN
  Administered 2013-08-28 (×2): 4 mg via INTRAVENOUS
  Filled 2013-08-28 (×2): qty 1

## 2013-08-28 NOTE — ED Provider Notes (Signed)
Medical screening examination/treatment/procedure(s) were conducted as a shared visit with non-physician practitioner(s) and myself.  I personally evaluated the patient during the encounter.   EKG Interpretation None      Pt c/o nv, epigastric pain. Mild  Tenderness. Labs.   Suzi RootsKevin E Chaquita Basques, MD 08/28/13 [id number]

## 2013-08-28 NOTE — ED Provider Notes (Signed)
CSN: [id number]     Arrival date & time 08/28/13  0905 History   First MD Initiated Contact with Patient 08/28/13 [phone]     Chief Complaint  Patient presents with  . Emesis  . Abdominal Pain     HPI Pt was seen at 0940. Per pt, c/o gradual onset and persistence of multiple intermittent episodes of N/V that began 2 to 3 days ago.  Has been associated with generalized abd "pain," described as "aching." Pt was evaluated in the ED 2 days ago for same, dx likely gastritis/esophagitis, rx zofran, pepcid and prilosec, and was referred to GI MD. States he continues to have abd pain, intermittent N/V since being discharged. Has been able to tol PO fluids but not food, per family. States he has been taking zofran with relief of nausea.  Denies diarrhea, no CP/SOB, no back pain, no fevers, no black or blood in stools or emesis.     Past Medical History  Diagnosis Date  . Hypertension   . Diabetes mellitus     type 2 iddm x 18 yrs  . Vascular disease     poor circulation to left foot  . Acute osteomyelitis, ankle and foot 07/30/2010    Qualifier: Diagnosis of  By: Daiva Eves MD, Remi Haggard    . Anemia   . Renal insufficiency   . Hiatal hernia   . DDD (degenerative disc disease), lumbar   . Pancreatitis    Past Surgical History  Procedure Laterality Date  . Cholecystectomy    . Toe amputation  2012    left foot; great toe and second toe  . Amputation  04/15/2011    Procedure: AMPUTATION RAY;  Surgeon: Nadara Mustard, MD;  Location: [facility] OR;  Service: Orthopedics;  Laterality: Left;  left foot third toe amputation and MPP joint and gastroc resection VS. achilles lengthing   . Amputation  04/25/2011    Procedure: AMPUTATION DIGIT;  Surgeon: Nadara Mustard, MD;  Location: [facility] OR;  Service: Orthopedics;  Laterality: Left;  Left foot 3rd toe amputation MTP joint, Gastroc Recession  Achilles Lengthening   . Amputation Bilateral 03/25/2013    Procedure: AMPUTATION RAY;  Surgeon: Nadara Mustard, MD;  Location:  [facility] OR;  Service: Orthopedics;  Laterality: Bilateral;  Left Great Toe Amputation at  MTP Joint, Right 1st and 2nd Ray Amputation     History  Substance Use Topics  . Smoking status: Current Every Day Smoker -- 0.25 packs/day for 1 years    Types: Cigarettes  . Smokeless tobacco: Never Used  . Alcohol Use: No    Review of Systems ROS: Statement: All systems negative except as marked or noted in the HPI; Constitutional: Negative for fever and chills. ; ; Eyes: Negative for eye pain, redness and discharge. ; ; ENMT: Negative for ear pain, hoarseness, nasal congestion, sinus pressure and sore throat. ; ; Cardiovascular: Negative for chest pain, palpitations, diaphoresis, dyspnea and peripheral edema. ; ; Respiratory: Negative for cough, wheezing and stridor. ; ; Gastrointestinal: +N/V, abd pain. Negative for diarrhea, blood in stool, hematemesis, jaundice and rectal bleeding. . ; ; Genitourinary: Negative for dysuria, flank pain and hematuria. ; ; Musculoskeletal: Negative for back pain and neck pain. Negative for swelling and trauma.; ; Skin: Negative for pruritus, rash, abrasions, blisters, bruising and skin lesion.; ; Neuro: Negative for headache, lightheadedness and neck stiffness. Negative for weakness, altered level of consciousness , altered mental status, extremity weakness, paresthesias, involuntary movement, seizure and syncope.  Allergies  Review of patient's allergies indicates no known allergies.  Home Medications   Current Outpatient Rx  Name  Route  Sig  Dispense  Refill  . famotidine (PEPCID) 20 MG tablet   Oral   Take 1 tablet (20 mg total) by mouth 2 (two) times daily.   30 tablet   0   . HYDROcodone-acetaminophen (NORCO/VICODIN) 5-325 MG per tablet   Oral   Take 1-2 tablets by mouth every 4 (four) hours as needed.   20 tablet   0   . lisinopril (PRINIVIL,ZESTRIL) 10 MG tablet   Oral   Take 10 mg by mouth daily.         . metFORMIN (GLUCOPHAGE) 500 MG  tablet   Oral   Take 500 mg by mouth 2 (two) times daily with a meal.         . omeprazole (PRILOSEC) 20 MG capsule   Oral   Take 1 capsule (20 mg total) by mouth daily.   30 capsule   0   . promethazine (PHENERGAN) 25 MG tablet   Oral   Take 1 tablet (25 mg total) by mouth every 6 (six) hours as needed for nausea or vomiting.   10 tablet   0    BP 161/96  Pulse 78  Temp(Src) 98.3 F (36.8 C) (Oral)  Resp 23  SpO2 100% Physical Exam 0945: Physical examination:  Nursing notes reviewed; Vital signs and O2 SAT reviewed;  Constitutional: Well developed, Well nourished, Well hydrated, Uncomfortable appearing.; Head:  Normocephalic, atraumatic; Eyes: EOMI, PERRL, No scleral icterus; ENMT: Mouth and pharynx normal, Mucous membranes moist; Neck: Supple, Full range of motion, No lymphadenopathy; Cardiovascular: Regular rate and rhythm, No murmur, rub, or gallop; Respiratory: Breath sounds clear & equal bilaterally, No rales, rhonchi, wheezes.  Speaking full sentences with ease, Normal respiratory effort/excursion; Chest: Nontender, Movement normal; Abdomen: Soft, +diffusely tender to palp, esp mid-epigastric area. No rebound or guarding. Nondistended, Normal bowel sounds; Genitourinary: No CVA tenderness; Extremities: Pulses normal, No tenderness, No edema, No calf edema or asymmetry.; Neuro: AA&Ox3, Major CN grossly intact.  Speech clear. No gross focal motor or sensory deficits in extremities. Climbs on and off stretcher easily by himself. Gait steady.; Skin: Color normal, Warm, Dry.   ED Course  Procedures     EKG Interpretation None      MDM  MDM Reviewed: previous chart, nursing note and vitals Reviewed previous: labs and CT scan Interpretation: labs and x-ray   Ct Abdomen Pelvis W Contrast 08/26/2013   CLINICAL DATA:  Nausea, vomiting, generalized abdominal pain, history hypertension, diabetes, remote cholecystectomy in 2012.  EXAM: CT ABDOMEN AND PELVIS WITH CONTRAST   TECHNIQUE: Multidetector CT imaging of the abdomen and pelvis was performed using the standard protocol following bolus administration of intravenous contrast. Sagittal and coronal MPR images reconstructed from axial data set.  CONTRAST:  100mL OMNIPAQUE IOHEXOL 300 MG/ML SOLN IV. Dilute oral contrast.  COMPARISON:  09/14/2012  FINDINGS: Dependent atelectasis right lower lobe.  Post cholecystectomy.  Ptotic and malrotated right kidney.  Tiny umbilical hernia containing fat.  Liver, spleen, pancreas, kidneys, and adrenal glands otherwise normal appearance.  Normal appendix.  Minimal bladder wall thickening similar to previous exam.  Small hiatal hernia with wall thickening.  Stomach and bowel loops otherwise normal appearance.  No mass, adenopathy, free fluid, or inflammatory process.  Degenerative disc disease changes at L4-L5 and L5-S1.  IMPRESSION: Ptotic and malrotated right kidney.  Small hiatal hernia with questionable wall  thickening of the distal esophagus and hernia sac ; consider routine follow-up endoscopy or upper GI exam to exclude mass, gastritis and distal esophagitis.  No other definite intra-abdominal or intrapelvic abnormalities.   Electronically Signed   By: Ulyses Southward [name]   On: 08/26/2013 20:45    Results for orders placed during the hospital encounter of 08/28/13  URINALYSIS, ROUTINE W REFLEX MICROSCOPIC      Result Value Ref Range   Color, Urine YELLOW  YELLOW   APPearance CLOUDY (*) CLEAR   Specific Gravity, Urine 1.026  1.005 - 1.030   pH 6.0  5.0 - 8.0   Glucose, UA NEGATIVE  NEGATIVE mg/dL   Hgb urine dipstick TRACE (*) NEGATIVE   Bilirubin Urine NEGATIVE  NEGATIVE   Ketones, ur NEGATIVE  NEGATIVE mg/dL   Protein, ur >161 (*) NEGATIVE mg/dL   Urobilinogen, UA 1.0  0.0 - 1.0 mg/dL   Nitrite NEGATIVE  NEGATIVE   Leukocytes, UA NEGATIVE  NEGATIVE  CBC WITH DIFFERENTIAL      Result Value Ref Range   WBC 11.1 (*) 4.0 - 10.5 K/uL   RBC 3.90 (*) 4.22 - 5.81 MIL/uL    Hemoglobin 11.5 (*) 13.0 - 17.0 g/dL   HCT 09.6 (*) 04.5 - 40.9 %   MCV 87.2  78.0 - 100.0 fL   MCH 29.5  26.0 - 34.0 pg   MCHC 33.8  30.0 - 36.0 g/dL   RDW 81.1  91.4 - 78.2 %   Platelets 203  150 - 400 K/uL   Neutrophils Relative % 80 (*) 43 - 77 %   Neutro Abs 8.8 (*) 1.7 - 7.7 K/uL   Lymphocytes Relative 14  12 - 46 %   Lymphs Abs 1.6  0.7 - 4.0 K/uL   Monocytes Relative 5  3 - 12 %   Monocytes Absolute 0.6  0.1 - 1.0 K/uL   Eosinophils Relative 1  0 - 5 %   Eosinophils Absolute 0.1  0.0 - 0.7 K/uL   Basophils Relative 0  0 - 1 %   Basophils Absolute 0.0  0.0 - 0.1 K/uL  COMPREHENSIVE METABOLIC PANEL      Result Value Ref Range   Sodium 139  137 - 147 mEq/L   Potassium 4.0  3.7 - 5.3 mEq/L   Chloride 99  96 - 112 mEq/L   CO2 26  19 - 32 mEq/L   Glucose, Bld 213 (*) 70 - 99 mg/dL   BUN 18  6 - 23 mg/dL   Creatinine, Ser 9.56 (*) 0.50 - 1.35 mg/dL   Calcium 9.6  8.4 - 21.3 mg/dL   Total Protein 8.1  6.0 - 8.3 g/dL   Albumin 3.6  3.5 - 5.2 g/dL   AST 17  0 - 37 U/L   ALT 15  0 - 53 U/L   Alkaline Phosphatase 90  39 - 117 U/L   Total Bilirubin 0.6  0.3 - 1.2 mg/dL   GFR calc non Af Amer 53 (*) >90 mL/min   GFR calc Af Amer 62 (*) >90 mL/min  LIPASE, BLOOD      Result Value Ref Range   Lipase 21  11 - 59 U/L  URINE MICROSCOPIC-ADD ON      Result Value Ref Range   Squamous Epithelial / LPF RARE  RARE   WBC, UA 0-2  <3 WBC/hpf   Bacteria, UA RARE  RARE   Urine-Other MUCOUS PRESENT     Ct Abdomen Pelvis  W Contrast 08/26/2013   CLINICAL DATA:  Nausea, vomiting, generalized abdominal pain, history hypertension, diabetes, remote cholecystectomy in 2012.  EXAM: CT ABDOMEN AND PELVIS WITH CONTRAST  TECHNIQUE: Multidetector CT imaging of the abdomen and pelvis was performed using the standard protocol following bolus administration of intravenous contrast. Sagittal and coronal MPR images reconstructed from axial data set.  CONTRAST:  OMNIPAQUE IOHEXOL 300 MG/ML SOLN IV.  Dilute oral contrast.  COMPARISON:  09/14/2012  FINDINGS: Dependent atelectasis right lower lobe.  Post cholecystectomy.  Ptotic and malrotated right kidney.  Tiny umbilical hernia containing fat.  Liver, spleen, pancreas, kidneys, and adrenal glands otherwise normal appearance.  Normal appendix.  Minimal bladder wall thickening similar to previous exam.  Small hiatal hernia with wall thickening.  Stomach and bowel loops otherwise normal appearance.  No mass, adenopathy, free fluid, or inflammatory process.  Degenerative disc disease changes at L4-L5 and L5-S1.  IMPRESSION: Ptotic and malrotated right kidney.  Small hiatal hernia with questionable wall thickening of the distal esophagus and hernia sac ; consider routine follow-up endoscopy or upper GI exam to exclude mass, gastritis and distal esophagitis.  No other definite intra-abdominal or intrapelvic abnormalities.   Electronically Signed   By: Ulyses Southward [name]   On: 08/26/2013 20:45   Dg Abd Acute W/chest 08/28/2013   CLINICAL DATA:  Shortness of breath, mid abdominal pain, nausea  EXAM: ACUTE ABDOMEN SERIES (ABDOMEN 2 VIEW & CHEST 1 VIEW)  COMPARISON:  CT abdomen pelvis dated 08/26/2013  FINDINGS: Lungs are clear. No pleural effusion or pneumothorax.  The heart is normal in size.  Nonobstructive bowel gas pattern. Residual contrast/stool in the left colon.  No evidence of free air under the diaphragm on the upright view.  Cholecystectomy clips.  Visualized osseous structures are within normal limits.  IMPRESSION: No evidence of acute cardiopulmonary disease.  No evidence of small bowel obstruction or free air.  Residual contrast in the left colon.   Electronically Signed   By: Charline Bills [name]   On: 08/28/2013 10:46    1330:  CT A/P yesterday reassuring. Labs today with known hyperglycemia (hx DM), renal insuff, and anemia; all near baseline. IVF given, as well as morphine, zofran, pepcid and protonix. Pt has tol PO well while in the ED without  N/V.  No stooling while in the ED.  Abd benign, VSS. Feels better and wants to go home now. Pt has already received rx for pepcid, prilosec, phenergan and vicodin 2 days ago; encouraged to take these meds as prescribed. Dx and testing d/w pt and family.  Questions answered.  Verb understanding, agreeable to d/c home with outpt f/u.     Laray Anger, DO 08/31/13 1205

## 2013-08-28 NOTE — Discharge Instructions (Signed)
°Emergency Department Resource Guide °1) Find a Doctor and Pay Out of Pocket °Although you won't have to find out who is covered by your insurance plan, it is a good idea to ask around and get recommendations. You will then need to call the office and see if the doctor you have chosen will accept you as a new patient and what types of options they offer for patients who are self-pay. Some doctors offer discounts or will set up payment plans for their patients who do not have insurance, but you will need to ask so you aren't surprised when you get to your appointment. ° °2) Contact Your Local Health Department °Not all health departments have doctors that can see patients for sick visits, but many do, so it is worth a call to see if yours does. If you don't know where your local health department is, you can check in your phone book. The CDC also has a tool to help you locate your state's health department, and many state websites also have listings of all of their local health departments. ° °3) Find a Walk-in Clinic °If your illness is not likely to be very severe or complicated, you may want to try a walk in clinic. These are popping up all over the country in pharmacies, drugstores, and shopping centers. They're usually staffed by nurse practitioners or physician assistants that have been trained to treat common illnesses and complaints. They're usually fairly quick and inexpensive. However, if you have serious medical issues or chronic medical problems, these are probably not your best option. ° °No Primary Care Doctor: °- Call [facility] at  832-8000 - they can help you locate a primary care doctor that  accepts your insurance, provides certain services, etc. °- Physician Referral Service- 1-800-533-3463 ° °Chronic Pain Problems: °Organization         Address  Phone   Notes  °[facility]  (336) 297-2271 Patients need to be referred by their primary care doctor.  ° °Medication  Assistance: °Organization         Address  Phone   Notes  °[place] [facility] [address] °[place], [place] [address] (336) 641-8030 --Must be a resident of [place] °-- Must have NO insurance coverage whatsoever (no Medicaid/ Medicare, etc.) °-- The pt. MUST have a primary care doctor that directs their care regularly and follows them in the community °  °[facility]  (866) 331-1348   °[employer]  (888) 892-1162   ° °Agencies that provide inexpensive medical care: °Organization         Address  Phone   Notes  °[facility]  (336) 832-8035   °[facility]    (336) 832-7272   °[facility] [address] °[place], [place] [address] (336) 832-4777   °[facility] [address], °[place] (336) 271-4999   °[facility]    (336) 373-0678   °[facility]    (336) 272-1050   °[facility] ° [address], [place] Phone:  (336) 832-4444, Fax:  (336) 832-4440 Hours of Operation:  9 am - 6 pm, M-F.  Also accepts Medicaid/Medicare and self-pay.  °[facility] ° [address], [place] Phone: (336) 832-3150, Fax: (336) [phone]. Hours of Operation:  8:30 am - 5:30 pm, M-F.  Also accepts Medicaid and self-pay.  °[facility] [place] [address], [place] Phone: (336) 878-6027   °[facility] [address], [place], [place] (336)723-1848, Ext. 123 Mondays & Thursdays: 7-9 AM.  First 15 patients are seen on a first come, first serve basis. °  ° °Medicaid-accepting [place] Providers: ° °Organization         Address  Phone   Notes  °[facility] [address], [place] (336) 641-2100 Also accepts self-pay patients.  °[facility] [address], [place] ° (336) 856-9996   °[facility] [address], [place]  (336) 288-8857   °[facility] [address] [place] [address], [place] (336) 299-7000   °Veita Bland [address], [place]  ° (336) 373-1557 Only accepts [place] Access Medicaid patients after they have their name applied to their card.  ° °Self-Pay (no insurance) in [place]: ° °Organization         Address  Phone   Notes  °Sickle Cell Patients, [facility] [address], [place] (336) 832-1970   °[facility] [address], [place] (336) 832-4400   °[facility] ° [address], [place] (336) 992-4800   °[facility]/Dr. Osei-Bonsu ° [address] [place] [address], [place] or [address], [place] (336) 841-8500 Phone number for both [place] and [place] locations is the same.  °[facility] [address], [place] (336) 299-0000   °[facility] [address] [place] [address], [place] or [address] (336) 852-7530 °(336) 878-2260   °[facility] [address], [place] (336) 350-1642, phone; (336) 294-5005, fax Sees patients 1st and 3rd Saturday of every month.  Must not qualify for public or private insurance (i.e. Medicaid, Medicare, [place] Health Choice, Veterans' Benefits) • Household income should be no more than 200% of the poverty level •The clinic cannot treat you if you are pregnant or think you are pregnant • Sexually transmitted diseases are not treated at the clinic.  ° ° °Dental Care: °Organization         Address  Phone  Notes  °[place] [facility] [address], [place] (336) 641-6152 Accepts children up to age 21 who are enrolled in Medicaid or [place] Health Choice; pregnant women with a Medicaid card; and children who have applied for Medicaid or [place] Health Choice, but were declined, whose parents can pay a reduced fee at time of service.  °[place]  [facility] [place]  [address], [place] (336) 641-7733 Accepts children up to age 21 who are enrolled in Medicaid or [place] Health Choice; pregnant women with a Medicaid card; and children who have applied for Medicaid or [place] Health Choice, but were declined, whose parents can pay a reduced fee at time of service.  °[facility] ° [address], [place] (336) 641-4533 Patients are seen by appointment only. Walk-ins are not accepted. [facility] will see patients 18 years of age and older. °Monday - Tuesday (8am-5pm) °Most Wednesdays (8:30-5pm) °$30 per visit, cash only  °[facility] ° [address], [place] (336) 641-4533 Patients are seen by appointment only. Walk-ins are not accepted. [facility] will see patients 18 years of age and older. °One   Wednesday Evening (Monthly: Volunteer Based).  $30 per visit, cash only  °[facility]  (919) 537-3737 for adults; Children under age 4, call Graduate Pediatric Dentistry at ([phone]) [phone]. Children aged 4-14, please call (919) [phone] to request a pediatric application. ° Dental services are provided in all areas of dental care including fillings, crowns and bridges, complete and partial dentures, implants, gum treatment, root canals, and extractions. Preventive care is also provided. Treatment is provided to both adults and children. °Patients are selected via a lottery and there is often a waiting list. °  °[facility] [address], °[place] ° (336) 763-8833 [email or web address] °  °[facility] [address], [place], [place] (336)723-1848, Ext. 123 Second and Fourth Thursday of each month, opens at 6:30 AM; Clinic ends at 9 AM.  Patients are seen on a first-come first-served basis, and a limited number are seen during each clinic.  ° °[facility] ° [address], [place], [place] (336) 723-7904    Eligibility Requirements °You must have lived in [place], [place], or [place] counties for at least the last three months. °  You cannot be eligible for state or federal sponsored healthcare insurance, including Veterans Administration, Medicaid, or Medicare. °  You generally cannot be eligible for healthcare insurance through your employer.  °  How to apply: °Eligibility screenings are held every Tuesday and Wednesday afternoon from 1:00 pm until 4:00 pm. You do not need an appointment for the interview!  °[facility] [address], [place], [place] 336-631-2330   °[facility]  336-342-8273   °[facility]  336-703-3100   °[facility]  336-570-6415   ° °Behavioral Health Resources in the Community: °Intensive Outpatient Programs °Organization         Address  Phone  Notes  °[facility] [address], [place], [place] 336-878-6098   °[facility] [address], [place], [place] 336-832-9800   °[facility] [address], [place], [place] ° 336-882-2125   °[facility] [address],  °[place], [place] 1-800-853-5163 or 336-641-4981   °Substance Abuse Resources °Organization         Address  Phone  Notes  °[facility]  336-882-2125   °[facility]  336-784-9470   °[facility]  336-285-9073   °[facility]  336-845-3988   °Residential & Outpatient Substance Abuse Program  1-800-659-3381   °Psychological Services °Organization         Address  Phone  Notes  °[facility]  336- 832-9600   °[facility]  336- 378-7881   °[facility] [address], [place] 1-800-853-5163 or 336-641-4981   ° °Mobile Crisis Teams °Organization         Address  Phone  Notes  °[facility], Mobile Crisis Care Unit  1-877-626-1772   °Assertive °[facility] ° [address]  [place], [place] 336-834-9664   °Sharon DeEsch [address] °[place] 336-554-5454   ° °Self-Help/Support Groups °Organization         Address  Phone             Notes  °[facility]  - variety of support groups  336- 373-1402 Call for more information  °Narcotics Anonymous (NA), [facility] [address], °[place]  2 meetings at this location  ° °  Residential Treatment Programs Organization         Address  Phone  Notes  [facility] [address],    [place]  1-610-960-45401-(331)023-8793   [facility]  [address], [place] [phone], [place], [place] [phone]   [facility] [address], [place] [phone] Admissions: 8am-3pm M-F  [facility] [address]    [place], [place] [phone]   [facility] [address], [place], [place] [phone]) [phone]   [facility] [address]  [place], [place] [phone]   [facility] Int[facility] Outpatient [address], [place], [place] [phone]   [facility] ([facility] [address]  [place], [place] [phone] or [phone]   [facility] [address] [place], [place] [phone] Accepts Medicaid  [facility] [address]  [place] [phone] Substance Abuse/Addiction Treatment   [facility]         Address  Phone  Notes  [facility]  8621353929(888) (631)701-8002   Angie FavaJulie Brannon, PhD [address], [address] [place], [place]   [phone](336) [phone] or [phone](336) [phone]   [facility]   [address] [place], [place] [phone](336) [phone]   [facility] [address], [place], [place] [phone](336) [phone] Insurance/Medicaid/sponsorship through [facility]  [facility] [address]                                    [place], [place] [phone](336) [phone] Therapy/tele-psych/case    [facility] [address]   [place], [place] [phone](336) [phone]    Dr. Lolly MustacheArfeen  [phone](336) [phone]   [facility]  [employer] [facility] 1) [address], [place] 2) [address], [place] 3)  [address], [place] [phone](336) [phone](336) [phone]  [phone](336) [phone]   [facility] Child Abuse Hot[facility] 226-652-4291(336) 808-754-4919 or (704)406-4281(336) [phone] (After Hours)       Eat a bland diet, avoiding greasy, fatty, fried foods, as well as spicy and acidic foods or beverages.  Avoid eating within the hour or 2 before going to bed or laying down.  Also avoid teas, colas, coffee, chocolate, pepermint and spearment.  Take the prescriptions that you received yesterday for pepcid and prilosec, as directed.  May also take over the counter maalox/mylanta, as directed on packaging, as needed for discomfort.  Take the new prescription as directed.  Call your regular medical doctor tomorrow to schedule a follow up appointment in the next 2 days. Call the GI doctor tomorrow to schedule a follow up appointment this week.  Return to the Emergency Department immediately if worsening.

## 2013-08-28 NOTE — ED Notes (Signed)
He c/o generlaized abd. Pain plus several episodes of emesis/day since Thurs.  Seen here Fri. And "got better--but then it came back".

## 2013-08-28 NOTE — ED Notes (Signed)
Initial contact - pt resting on stretcher with eyes closed, pt reports pain/nausea improved at this time, continues to c/o mild diffuse abd pain.  Pt given PO fluid challenge at this time and tolerating well at this time.  NAD.

## 2013-08-29 LAB — URINE CULTURE
CULTURE: NO GROWTH
Colony Count: NO GROWTH

## 2013-08-30 ENCOUNTER — Observation Stay (HOSPITAL_COMMUNITY)
Admission: EM | Admit: 2013-08-30 | Discharge: 2013-08-31 | Disposition: A | Discharge status: Home / Self Care | Payer: Medicaid Other | Attending: Internal Medicine | Admitting: Internal Medicine

## 2013-08-30 ENCOUNTER — Encounter (HOSPITAL_COMMUNITY): Payer: Self-pay | Admitting: Emergency Medicine

## 2013-08-30 ENCOUNTER — Emergency Department (HOSPITAL_COMMUNITY): Payer: Medicaid Other

## 2013-08-30 DIAGNOSIS — N183 Chronic kidney disease, stage 3 unspecified: Secondary | ICD-10-CM | POA: Diagnosis present

## 2013-08-30 DIAGNOSIS — M51379 Other intervertebral disc degeneration, lumbosacral region without mention of lumbar back pain or lower extremity pain: Secondary | ICD-10-CM | POA: Insufficient documentation

## 2013-08-30 DIAGNOSIS — M869 Osteomyelitis, unspecified: Secondary | ICD-10-CM

## 2013-08-30 DIAGNOSIS — M5137 Other intervertebral disc degeneration, lumbosacral region: Secondary | ICD-10-CM | POA: Insufficient documentation

## 2013-08-30 DIAGNOSIS — E11621 Type 2 diabetes mellitus with foot ulcer: Secondary | ICD-10-CM

## 2013-08-30 DIAGNOSIS — S98139A Complete traumatic amputation of one unspecified lesser toe, initial encounter: Secondary | ICD-10-CM | POA: Insufficient documentation

## 2013-08-30 DIAGNOSIS — N182 Chronic kidney disease, stage 2 (mild): Secondary | ICD-10-CM

## 2013-08-30 DIAGNOSIS — R509 Fever, unspecified: Secondary | ICD-10-CM | POA: Diagnosis present

## 2013-08-30 DIAGNOSIS — R1115 Cyclical vomiting syndrome unrelated to migraine: Secondary | ICD-10-CM | POA: Insufficient documentation

## 2013-08-30 DIAGNOSIS — IMO0002 Reserved for concepts with insufficient information to code with codable children: Secondary | ICD-10-CM

## 2013-08-30 DIAGNOSIS — A4101 Sepsis due to Methicillin susceptible Staphylococcus aureus: Secondary | ICD-10-CM

## 2013-08-30 DIAGNOSIS — R112 Nausea with vomiting, unspecified: Secondary | ICD-10-CM

## 2013-08-30 DIAGNOSIS — D649 Anemia, unspecified: Secondary | ICD-10-CM | POA: Insufficient documentation

## 2013-08-30 DIAGNOSIS — K859 Acute pancreatitis without necrosis or infection, unspecified: Secondary | ICD-10-CM

## 2013-08-30 DIAGNOSIS — I999 Unspecified disorder of circulatory system: Secondary | ICD-10-CM | POA: Insufficient documentation

## 2013-08-30 DIAGNOSIS — I129 Hypertensive chronic kidney disease with stage 1 through stage 4 chronic kidney disease, or unspecified chronic kidney disease: Secondary | ICD-10-CM | POA: Insufficient documentation

## 2013-08-30 DIAGNOSIS — N179 Acute kidney failure, unspecified: Secondary | ICD-10-CM

## 2013-08-30 DIAGNOSIS — F17200 Nicotine dependence, unspecified, uncomplicated: Secondary | ICD-10-CM | POA: Insufficient documentation

## 2013-08-30 DIAGNOSIS — B351 Tinea unguium: Secondary | ICD-10-CM

## 2013-08-30 DIAGNOSIS — R109 Unspecified abdominal pain: Secondary | ICD-10-CM

## 2013-08-30 DIAGNOSIS — E1165 Type 2 diabetes mellitus with hyperglycemia: Secondary | ICD-10-CM

## 2013-08-30 DIAGNOSIS — E1151 Type 2 diabetes mellitus with diabetic peripheral angiopathy without gangrene: Secondary | ICD-10-CM

## 2013-08-30 DIAGNOSIS — S98119A Complete traumatic amputation of unspecified great toe, initial encounter: Secondary | ICD-10-CM | POA: Insufficient documentation

## 2013-08-30 DIAGNOSIS — M86179 Other acute osteomyelitis, unspecified ankle and foot: Secondary | ICD-10-CM

## 2013-08-30 DIAGNOSIS — A084 Viral intestinal infection, unspecified: Secondary | ICD-10-CM

## 2013-08-30 DIAGNOSIS — E119 Type 2 diabetes mellitus without complications: Secondary | ICD-10-CM

## 2013-08-30 DIAGNOSIS — Z794 Long term (current) use of insulin: Secondary | ICD-10-CM | POA: Insufficient documentation

## 2013-08-30 DIAGNOSIS — L97509 Non-pressure chronic ulcer of other part of unspecified foot with unspecified severity: Secondary | ICD-10-CM

## 2013-08-30 DIAGNOSIS — E1169 Type 2 diabetes mellitus with other specified complication: Secondary | ICD-10-CM

## 2013-08-30 DIAGNOSIS — R1013 Epigastric pain: Principal | ICD-10-CM | POA: Diagnosis present

## 2013-08-30 DIAGNOSIS — I10 Essential (primary) hypertension: Secondary | ICD-10-CM

## 2013-08-30 DIAGNOSIS — R110 Nausea: Secondary | ICD-10-CM | POA: Insufficient documentation

## 2013-08-30 DIAGNOSIS — K449 Diaphragmatic hernia without obstruction or gangrene: Secondary | ICD-10-CM | POA: Insufficient documentation

## 2013-08-30 LAB — URINALYSIS, ROUTINE W REFLEX MICROSCOPIC
Glucose, UA: NEGATIVE mg/dL
Hgb urine dipstick: NEGATIVE
KETONES UR: NEGATIVE mg/dL
LEUKOCYTES UA: NEGATIVE
NITRITE: NEGATIVE
Specific Gravity, Urine: 1.023 (ref 1.005–1.030)
Urobilinogen, UA: 1 mg/dL (ref 0.0–1.0)
pH: 6.5 (ref 5.0–8.0)

## 2013-08-30 LAB — COMPREHENSIVE METABOLIC PANEL
ALK PHOS: 87 U/L (ref 39–117)
ALT: 13 U/L (ref 0–53)
AST: 16 U/L (ref 0–37)
Albumin: 3.6 g/dL (ref 3.5–5.2)
BILIRUBIN TOTAL: 0.3 mg/dL (ref 0.3–1.2)
BUN: 16 mg/dL (ref 6–23)
CHLORIDE: 99 meq/L (ref 96–112)
CO2: 27 meq/L (ref 19–32)
Calcium: 9.3 mg/dL (ref 8.4–10.5)
Creatinine, Ser: 1.61 mg/dL — ABNORMAL HIGH (ref 0.50–1.35)
GFR, EST AFRICAN AMERICAN: 62 mL/min — AB (ref >90)
GFR, EST NON AFRICAN AMERICAN: 54 mL/min — AB (ref >90)
GLUCOSE: 183 mg/dL — AB (ref 70–99)
POTASSIUM: 4.2 meq/L (ref 3.7–5.3)
SODIUM: 137 meq/L (ref 137–147)
Total Protein: 8.1 g/dL (ref 6.0–8.3)

## 2013-08-30 LAB — CBC WITH DIFFERENTIAL/PLATELET
Basophils Absolute: 0 10*3/uL (ref 0.0–0.1)
Basophils Relative: 0 % (ref 0–1)
Eosinophils Absolute: 0.1 10*3/uL (ref 0.0–0.7)
Eosinophils Relative: 1 % (ref 0–5)
HCT: 35.4 % — ABNORMAL LOW (ref 39.0–52.0)
Hemoglobin: 12.4 g/dL — ABNORMAL LOW (ref 13.0–17.0)
LYMPHS ABS: 1.4 10*3/uL (ref 0.7–4.0)
LYMPHS PCT: 14 % (ref 12–46)
MCH: 30.4 pg (ref 26.0–34.0)
MCHC: 35 g/dL (ref 30.0–36.0)
MCV: 86.8 fL (ref 78.0–100.0)
MONOS PCT: 6 % (ref 3–12)
Monocytes Absolute: 0.5 10*3/uL (ref 0.1–1.0)
NEUTROS PCT: 80 % — AB (ref 43–77)
Neutro Abs: 7.8 10*3/uL — ABNORMAL HIGH (ref 1.7–7.7)
PLATELETS: 215 10*3/uL (ref 150–400)
RBC: 4.08 MIL/uL — AB (ref 4.22–5.81)
RDW: 14 % (ref 11.5–15.5)
WBC: 9.8 10*3/uL (ref 4.0–10.5)

## 2013-08-30 LAB — RAPID URINE DRUG SCREEN, HOSP PERFORMED
AMPHETAMINES: NOT DETECTED
Barbiturates: POSITIVE — AB
Benzodiazepines: NOT DETECTED
Cocaine: NOT DETECTED
Opiates: POSITIVE — AB
TETRAHYDROCANNABINOL: POSITIVE — AB

## 2013-08-30 LAB — LIPASE, BLOOD: Lipase: 30 U/L (ref 11–59)

## 2013-08-30 LAB — AMYLASE: AMYLASE: 119 U/L — AB (ref 0–105)

## 2013-08-30 LAB — URINE MICROSCOPIC-ADD ON

## 2013-08-30 LAB — GLUCOSE, CAPILLARY: GLUCOSE-CAPILLARY: 104 mg/dL — AB (ref 70–99)

## 2013-08-30 LAB — TROPONIN I: Troponin I: <0.3 ng/mL (ref <0.30)

## 2013-08-30 MED ORDER — ONDANSETRON HCL 4 MG/2ML IJ SOLN
4.0000 mg | Freq: Four times a day (QID) | INTRAMUSCULAR | Status: DC | PRN
Start: 2013-08-30 — End: 2013-08-31

## 2013-08-30 MED ORDER — IOHEXOL 300 MG/ML  SOLN
80.0000 mL | Freq: Once | INTRAMUSCULAR | Status: AC | PRN
  Administered 2013-08-30: 80 mL via INTRAVENOUS

## 2013-08-30 MED ORDER — INSULIN ASPART 100 UNIT/ML ~~LOC~~ SOLN
0.0000 [IU] | Freq: Three times a day (TID) | SUBCUTANEOUS | Status: DC
  Administered 2013-08-31 (×2): 1 [IU] via SUBCUTANEOUS

## 2013-08-30 MED ORDER — SODIUM CHLORIDE 0.9 % IV SOLN
INTRAVENOUS | Status: DC
  Administered 2013-08-30: 20:00:00 via INTRAVENOUS

## 2013-08-30 MED ORDER — MORPHINE SULFATE 4 MG/ML IJ SOLN
4.0000 mg | Freq: Once | INTRAMUSCULAR | Status: AC
  Administered 2013-08-30: 4 mg via INTRAVENOUS
  Filled 2013-08-30: qty 1

## 2013-08-30 MED ORDER — LISINOPRIL 10 MG PO TABS
10.0000 mg | ORAL_TABLET | Freq: Every day | ORAL | Status: DC
  Administered 2013-08-30 – 2013-08-31 (×2): 10 mg via ORAL
  Filled 2013-08-30 (×2): qty 1

## 2013-08-30 MED ORDER — PANTOPRAZOLE SODIUM 40 MG IV SOLR
40.0000 mg | INTRAVENOUS | Status: DC
  Administered 2013-08-30: 40 mg via INTRAVENOUS
  Filled 2013-08-30 (×2): qty 40

## 2013-08-30 MED ORDER — ALUM & MAG HYDROXIDE-SIMETH 200-200-20 MG/5ML PO SUSP: 30.0000 mL | Freq: Four times a day (QID) | ORAL | Status: DC | PRN

## 2013-08-30 MED ORDER — ACETAMINOPHEN 325 MG PO TABS: 650.0000 mg | ORAL_TABLET | Freq: Four times a day (QID) | ORAL | Status: DC | PRN

## 2013-08-30 MED ORDER — ACETAMINOPHEN 650 MG RE SUPP: 650.0000 mg | Freq: Four times a day (QID) | RECTAL | Status: DC | PRN

## 2013-08-30 MED ORDER — HYDROCODONE-ACETAMINOPHEN 5-325 MG PO TABS
1.0000 | ORAL_TABLET | ORAL | Status: DC | PRN
  Administered 2013-08-30 – 2013-08-31 (×4): 2 via ORAL
  Filled 2013-08-30 (×4): qty 2

## 2013-08-30 MED ORDER — HYDRALAZINE HCL 20 MG/ML IJ SOLN
5.0000 mg | Freq: Four times a day (QID) | INTRAMUSCULAR | Status: DC | PRN
  Filled 2013-08-30: qty 1

## 2013-08-30 MED ORDER — ONDANSETRON HCL 4 MG PO TABS: 4.0000 mg | ORAL_TABLET | Freq: Four times a day (QID) | ORAL | Status: DC | PRN

## 2013-08-30 MED ORDER — IOHEXOL 300 MG/ML  SOLN
25.0000 mL | Freq: Once | INTRAMUSCULAR | Status: AC | PRN
  Administered 2013-08-30: 25 mL via ORAL

## 2013-08-30 MED ORDER — HYDROMORPHONE HCL PF 1 MG/ML IJ SOLN
1.0000 mg | Freq: Once | INTRAMUSCULAR | Status: AC
  Administered 2013-08-30: 1 mg via INTRAVENOUS
  Filled 2013-08-30: qty 1

## 2013-08-30 MED ORDER — ONDANSETRON HCL 4 MG/2ML IJ SOLN
4.0000 mg | Freq: Once | INTRAMUSCULAR | Status: AC
  Administered 2013-08-30: 4 mg via INTRAVENOUS
  Filled 2013-08-30: qty 2

## 2013-08-30 MED ORDER — METOCLOPRAMIDE HCL 5 MG/ML IJ SOLN
5.0000 mg | Freq: Three times a day (TID) | INTRAMUSCULAR | Status: DC | PRN
  Filled 2013-08-30: qty 1

## 2013-08-30 MED ORDER — HEPARIN SODIUM (PORCINE) 5000 UNIT/ML IJ SOLN
5000.0000 [IU] | Freq: Three times a day (TID) | INTRAMUSCULAR | Status: DC
  Administered 2013-08-30 – 2013-08-31 (×2): 5000 [IU] via SUBCUTANEOUS
  Filled 2013-08-30 (×5): qty 1

## 2013-08-30 NOTE — ED Notes (Signed)
Contrast has been ingested. Called CT to Notify

## 2013-08-30 NOTE — Progress Notes (Signed)
Called ED for report. Spoke to Stryker CorporationBrandy Leak.

## 2013-08-30 NOTE — ED Provider Notes (Signed)
Medical screening examination/treatment/procedure(s) were performed by non-physician practitioner and as supervising physician I was immediately available for consultation/collaboration.   EKG Interpretation None       Shelda JakesScott W. Nellie Chevalier, MD 08/30/13 [phone]

## 2013-08-30 NOTE — ED Provider Notes (Deleted)
Medical screening examination/treatment/procedure(s) were conducted as a shared visit with non-physician practitioner(s) and myself.  I personally evaluated the patient during the encounter.   EKG Interpretation None        Shelda JakesScott W. Francine Hannan, MD 08/30/13 [phone]

## 2013-08-30 NOTE — ED Notes (Signed)
[facility] Sunday morning for the same abd pain. Was diagnosed with Hiatal Hernia. [facility].

## 2013-08-30 NOTE — ED Notes (Signed)
Fluid Challenge per PA, Mathis FareAlbert

## 2013-08-30 NOTE — H&P (Signed)
[facility] History and Physical  [name] ZOX:[id number] DOB: September 14, 1975 DOA: 08/30/2013  Referring physician: Trevor Mace, PA-C PCP: Dorrene German, MD   Chief Complaint: Nausea and vomiting  HPI: [name] is a 38 y.o. 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 Patient came in to the hospital because of nausea and vomiting. This is a third time he came in to the ED within 4 days. Patient reported about 6 days ago he developed nausea and vomiting associated with some epigastric abdominal pain. Denies any diarrhea. Symptoms continued to worsen seen in the emergency department about 4 days ago and CT scan was done and showed hiatal hernia with some gastric thickening which is probably secondary to gastritis and recommended to followup with GI. Patient to schedule GI appointment on April 8. Meanwhile he continues to have symptoms so he came into the ED 2 days ago and was sent home on nausea medicines, he came in again to the hospital reporting that he could not keep anything since about 6 days ago. In the ED CT scan showed no acute findings, lipase is 30, normal WBCs, normal potassium, normal BUN and creatinine is 1.6 which is at baseline. Patient challenge in the ED with some ginger ale but he and reported severe nausea/vomiting and abdominal pain. Patient will be admitted to the hospital overnight for observation.  Review of Systems:  Constitutional: negative for anorexia, fevers and sweats Eyes: negative for irritation, redness and visual disturbance Ears, nose, mouth, throat, and face: negative for earaches, epistaxis, nasal congestion and sore throat Respiratory: negative for cough, dyspnea on exertion, sputum and wheezing Cardiovascular: negative for chest pain, dyspnea, lower extremity edema, orthopnea, palpitations and syncope Gastrointestinal: Reported abdominal pain, nausea and  vomiting. Denies diarrhea Genitourinary:negative for dysuria, frequency and hematuria Hematologic/lymphatic: negative for bleeding, easy bruising and lymphadenopathy Musculoskeletal:negative for arthralgias, muscle weakness and stiff joints Neurological: negative for coordination problems, gait problems, headaches and weakness Endocrine: negative for diabetic symptoms including polydipsia, polyuria and weight loss Allergic/Immunologic: negative for anaphylaxis, hay fever and urticaria  Past Medical History  Diagnosis Date  . Hypertension   . Diabetes mellitus     type 2 iddm x 18 yrs  . Vascular disease     poor circulation to left foot  . Acute osteomyelitis, ankle and foot 07/30/2010    Qualifier: Diagnosis of  By: Daiva Eves MD, Remi Haggard    . Anemia   . Renal insufficiency   . Hiatal hernia   . DDD (degenerative disc disease), lumbar   . Pancreatitis    Past Surgical History  Procedure Laterality Date  . Cholecystectomy    . Toe amputation  2012    left foot; great toe and second toe  . Amputation  04/15/2011    Procedure: AMPUTATION RAY;  Surgeon: Nadara Mustard, MD;  Location[facility] OR;  Service: Orthopedics;  Laterality: Left;  left foot third toe amputation and MPP joint and gastroc resection VS. achilles lengthing   . Amputation  04/25/2011    Procedure: AMPUTATION DIGIT;  Surgeon: Nadara Mustard, MD;  Location: [facility] OR;  Service: Orthopedics;  Laterality: Left;  Left foot 3rd toe amputation MTP joint, Gastroc Recession  Achilles Lengthening   . Amputation Bilateral 03/25/2013    Procedure: AMPUTATION RAY;  Surgeon: Nadara Mustard, MD;  Location: [facility] OR;  Service: Orthopedics;  Laterality: Bilateral;  Left Great Toe Amputation at  MTP Joint, Right  1st and 2nd Ray Amputation    Social History:   reports that he has been smoking Cigarettes.  He has a .25 pack-year smoking history. He has never used smokeless tobacco. He reports that he uses illicit drugs (Marijuana) about 10 times per  week. He reports that he does not drink alcohol.  No Known Allergies  No family history on file.   Prior to Admission medications   Medication Sig Start Date End Date Taking? Authorizing Provider  famotidine (PEPCID) 20 MG tablet Take 1 tablet (20 mg total) by mouth 2 (two) times daily. 08/26/13  Yes Tatyana A Kirichenko, PA-C  HYDROcodone-acetaminophen (NORCO/VICODIN) 5-325 MG per tablet Take 1-2 tablets by mouth every 4 (four) hours as needed. 08/26/13  Yes Tatyana A Kirichenko, PA-C  lisinopril (PRINIVIL,ZESTRIL) 10 MG tablet Take 10 mg by mouth daily.   Yes Historical Provider, MD  metFORMIN (GLUCOPHAGE) 500 MG tablet Take 500 mg by mouth 2 (two) times daily with a meal.   Yes Historical Provider, MD  omeprazole (PRILOSEC) 20 MG capsule Take 1 capsule (20 mg total) by mouth daily. 08/26/13  Yes Tatyana A Kirichenko, PA-C  ondansetron (ZOFRAN) 4 MG tablet Take 1 tablet (4 mg total) by mouth every 8 (eight) hours as needed for nausea or vomiting. 08/28/13  Yes Laray AngerKathleen M McManus, DO  promethazine (PHENERGAN) 25 MG tablet Take 1 tablet (25 mg total) by mouth every 6 (six) hours as needed for nausea or vomiting. 08/26/13  Yes Tatyana A Kirichenko, PA-C   Physical Exam: Filed Vitals:   08/30/13 1600  BP: 189/102  Pulse: 79  Temp:   Resp:    Constitutional: Oriented to person, place, and time. Well-developed and well-nourished. Cooperative.  Head: Normocephalic and atraumatic.  Nose: Nose normal.  Mouth/Throat: Uvula is midline, oropharynx is clear and moist and mucous membranes are normal.  Eyes: Conjunctivae and EOM are normal. Pupils are equal, round, and reactive to light.  Neck: Trachea normal and normal range of motion. Neck supple.  Cardiovascular: Normal rate, regular rhythm, S1 normal, S2 normal, normal heart sounds and intact distal pulses.   Pulmonary/Chest: Effort normal and breath sounds normal.  Abdominal: Soft. Bowel sounds are normal. There is no hepatosplenomegaly. There is  no tenderness.  Musculoskeletal: Normal range of motion.  bilateral great toe amputation  Neurological: Alert and oriented to person, place, and time. He has normal strength. No cranial nerve deficit or sensory deficit.  Skin: Skin is warm, dry and intact.  Psychiatric: Has a normal mood and affect. Speech is normal and behavior is normal.   Labs on Admission:  Basic Metabolic Panel:  Recent Labs Lab 08/26/13 1750 08/28/13 1017 08/30/13 1203  NA 139 139 137  K 5.3 4.0 4.2  CL 100 99 99  CO2 22 26 27   GLUCOSE 190* 213* 183*  BUN 23 18 16   CREATININE 1.53* 1.62* 1.61*  CALCIUM 10.0 9.6 9.3   Liver Function Tests:  Recent Labs Lab 08/26/13 1750 08/28/13 1017 08/30/13 1203  AST 33 17 16  ALT 22 15 13   ALKPHOS 96 90 87  BILITOT 0.3 0.6 0.3  PROT 8.7* 8.1 8.1  ALBUMIN 3.8 3.6 3.6    Recent Labs Lab 08/26/13 1750 08/28/13 1017 08/30/13 1203  LIPASE 27 21 30   AMYLASE  --   --  119*   No results found for this basename: AMMONIA,  in the last 168 hours CBC:  Recent Labs Lab 08/26/13 1750 08/28/13 1017 08/30/13 1203  WBC 10.5 11.1*  9.8  NEUTROABS 8.3* 8.8* 7.8*  HGB 12.4* 11.5* 12.4*  HCT 36.6* 34.0* 35.4*  MCV 86.5 87.2 86.8  PLT 204 203 215   Cardiac Enzymes: No results found for this basename: CKTOTAL, CKMB, CKMBINDEX, TROPONINI,  in the last 168 hours  BNP (last 3 results) No results found for this basename: PROBNP,  in the last 8760 hours CBG:  Recent Labs Lab 08/26/13 1748  GLUCAP 155*    Radiological Exams on Admission: Ct Abdomen Pelvis W Contrast  08/30/2013   CLINICAL DATA:  Abdominal pain, vomiting.  EXAM: CT ABDOMEN AND PELVIS WITH CONTRAST  TECHNIQUE: Multidetector CT imaging of the abdomen and pelvis was performed using the standard protocol following bolus administration of intravenous contrast.  CONTRAST:  80mL OMNIPAQUE IOHEXOL 300 MG/ML  SOLN  COMPARISON:  DG ABD ACUTE W/CHEST dated 08/28/2013; CT ABD/PELVIS W CM dated 08/26/2013   FINDINGS: Small hiatal hernia. Heart is upper limits normal in size. Ground-glass opacities in the lung bases likely reflect atelectasis. No effusions.  Prior cholecystectomy. Liver, spleen, pancreas, adrenals and left kidney are unremarkable. 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 No hydronephrosis.  Appendix is visualized and is normal. Stomach, large and small bowel are grossly unremarkable. No free fluid, free air or adenopathy.  No acute bony abnormality. Degenerative changes in the lower lumbar spine.  IMPRESSION: No significant change since recent study. No evidence of bowel obstruction. No acute findings.   Electronically Signed   By: Charlett Nose [name]   On: 08/30/2013 15:30    EKG: Independently reviewed.   Assessment/Plan Principal Problem:   Nausea & vomiting Active Problems:   Diabetes mellitus   CKD (chronic kidney disease), stage II   Essential hypertension, benign   Epigastric abdominal pain   Low grade fever    Nausea and vomiting -Intractable nausea and vomiting, reported for the past 6 days. -CT of abdomen pelvis did not show any acute findings. I will check troponin and TSH. -Normal lipase, patient denies diarrhea. -Started on PPI, Maalox as needed. -Symptomatic management with antiemetics, IV fluids and advance diet as tolerated. -Patient said he's been diabetic for over 15 years, start Reglan.  Low-grade fever -Unclear etiology, could be likely secondary to viral syndrome along with his nausea and vomiting. -He has clear urine, chest x-ray from 2 days ago was negative, CT abdomen is negative. -I will defer starting antibiotics for now, obtain blood culture and start antibiotics if develops high-grade fever.  Hypertension -Blood pressure in the high side, last blood pressure was 189/102. -This could be likely secondary to patient not taking blood pressure medicines secondary to nausea and vomiting. -Lisinopril restarted, hydralazine when necessary for  systolic blood pressure more than 160.  Diabetes mellitus -Check hemoglobin A1c, start carbohydrate modified diet. -Insulin sliding scale, hold metformin.  CKD stage II -Likely secondary to diabetes and hypertension. -Patient is on lisinopril, restarted. IV fluids also started.  Epigastric pain -Likely secondary to intractable vomiting, because of diabetes will rule out ACS. -Check troponin, TSH. -Patient will likely benefit from gastric emptying study to be done as outpatient when nausea and vomiting resolves.  Code Status: Full code Family Communication: Plan discussed with the patient. Disposition Plan: Observation, MedSurg  Time spent: 70 minutes  [facility] A [facility] Pager [phone]

## 2013-08-30 NOTE — ED Notes (Signed)
Pt reports since last Wednesday has been nasueated, vomiting with abdominal pain all over. Denies recent fever, diarrhea, sick contacts.

## 2013-08-30 NOTE — ED Provider Notes (Signed)
CSN: [id number]     Arrival date & time 08/30/13  1055 History   First MD Initiated Contact with Patient 08/30/13 1134     Chief Complaint  Patient presents with  . Abdominal Pain  . Emesis     (Consider location/radiation/quality/duration/timing/severity/associated sxs/prior Treatment) HPI Comments: She is a 38 year old male with a past medical history hypertension, diabetes, pancreatitis, hiatal hernia, renal insufficiency, anemia and vascular disease who presents to emergency department complaining of worsening abdominal pain x6 days. Patient has been seen in the emergency department twice for the same, had a CT scan on 08/26/2013 showing Small hiatal hernia with questionable wall thickening of the distal esophagus and hernia sac; consider routine follow-up endoscopy or upper GI exam to exclude mass, gastritis and distal esophagitis. He returned 2 days later and had a normal abdominal plain film. Patient states his pain has worsened, he has not been able to keep anything down for the past 6 days. Pain is generalized throughout his abdomen, described as sharp, cramping and severe, radiating towards his back, worsened by sitting upright or leaning forward. No specific alleviating factors. Admits to associated subjective fever, nausea with multiple episodes of vomiting, states he has seen a few streaks of bright red blood. States he had one episode of nonbloody diarrhea yesterday, otherwise normal bowel movements. He called the GI and has an appointment scheduled for April 8.  The history is provided by the patient and the spouse.    Past Medical History  Diagnosis Date  . Hypertension   . Diabetes mellitus     type 2 iddm x 18 yrs  . Vascular disease     poor circulation to left foot  . Acute osteomyelitis, ankle and foot 07/30/2010    Qualifier: Diagnosis of  By: Daiva EvesVan Dam MD, Remi Haggardornelius    . Anemia   . Renal insufficiency   . Hiatal hernia   . DDD (degenerative disc disease), lumbar   .  Pancreatitis    Past Surgical History  Procedure Laterality Date  . Cholecystectomy    . Toe amputation  2012    left foot; great toe and second toe  . Amputation  04/15/2011    Procedure: AMPUTATION RAY;  Surgeon: Nadara MustardMarcus V Duda, MD;  [facility];  Service: Orthopedics;  Laterality: Left;  left foot third toe amputation and MPP joint and gastroc resection VS. achilles lengthing   . Amputation  04/25/2011    Procedure: AMPUTATION DIGIT;  Surgeon: Nadara MustardMarcus V Duda, MD;  [facility];  Service: Orthopedics;  Laterality: Left;  Left foot 3rd toe amputation MTP joint, Gastroc Recession  Achilles Lengthening   . Amputation Bilateral 03/25/2013    Procedure: AMPUTATION RAY;  Surgeon: Nadara MustardMarcus V Duda, MD;  [facility];  Service: Orthopedics;  Laterality: Bilateral;  Left Great Toe Amputation at  MTP Joint, Right 1st and 2nd Ray Amputation    No family history on file. History  Substance Use Topics  . Smoking status: Current Every Day Smoker -- 0.25 packs/day for 1 years    Types: Cigarettes  . Smokeless tobacco: Never Used  . Alcohol Use: No    Review of Systems  Constitutional: Positive for fever and appetite change.  Gastrointestinal: Positive for nausea, vomiting, abdominal pain and diarrhea.  Musculoskeletal: Positive for back pain.  All other systems reviewed and are negative.      Allergies  Review of patient's allergies indicates no known allergies.  Home Medications   Current Outpatient Rx  Name  Route  Sig  Dispense  Refill  . famotidine (PEPCID) 20 MG tablet   Oral   Take 1 tablet (20 mg total) by mouth 2 (two) times daily.   30 tablet   0   . HYDROcodone-acetaminophen (NORCO/VICODIN) 5-325 MG per tablet   Oral   Take 1-2 tablets by mouth every 4 (four) hours as needed.   20 tablet   0   . lisinopril (PRINIVIL,ZESTRIL) 10 MG tablet   Oral   Take 10 mg by mouth daily.         . metFORMIN (GLUCOPHAGE) 500 MG tablet   Oral   Take 500 mg by mouth 2 (two)  times daily with a meal.         . omeprazole (PRILOSEC) 20 MG capsule   Oral   Take 1 capsule (20 mg total) by mouth daily.   30 capsule   0   . ondansetron (ZOFRAN) 4 MG tablet   Oral   Take 1 tablet (4 mg total) by mouth every 8 (eight) hours as needed for nausea or vomiting.   6 tablet   0   . promethazine (PHENERGAN) 25 MG tablet   Oral   Take 1 tablet (25 mg total) by mouth every 6 (six) hours as needed for nausea or vomiting.   10 tablet   0    BP 188/108  Pulse 83  Temp(Src) 99.3 F (37.4 C) (Oral)  Resp 17  Wt 250 lb (113.399 kg)  SpO2 100% Physical Exam  Nursing note and vitals reviewed. Constitutional: He is oriented to person, place, and time. He appears well-developed and well-nourished. No distress.  Uncomfortable.  HENT:  Head: Normocephalic and atraumatic.  Mouth/Throat: Oropharynx is clear and moist.  Eyes: Conjunctivae are normal.  Neck: Normal range of motion. Neck supple.  Cardiovascular: Normal rate, regular rhythm and normal heart sounds.   Pulmonary/Chest: Effort normal and breath sounds normal.  Abdominal: Soft. Normal appearance and bowel sounds are normal. He exhibits no distension. There is generalized tenderness. There is guarding. There is no rigidity and no rebound.  Generalized abdominal tenderness, worse in midepigastric with guarding. No peritoneal signs.  Musculoskeletal: Normal range of motion. He exhibits no edema.  Neurological: He is alert and oriented to person, place, and time.  Skin: Skin is warm and dry. He is not diaphoretic.  Psychiatric: He has a normal mood and affect. His behavior is normal.    ED Course  Procedures (including critical care time) Labs Review Labs Reviewed  AMYLASE - Abnormal; Notable for the following:    Amylase 119 (*)    All other components within normal limits  CBC WITH DIFFERENTIAL - Abnormal; Notable for the following:    RBC 4.08 (*)    Hemoglobin 12.4 (*)    HCT 35.4 (*)     Neutrophils Relative % 80 (*)    Neutro Abs 7.8 (*)    All other components within normal limits  COMPREHENSIVE METABOLIC PANEL - Abnormal; Notable for the following:    Glucose, Bld 183 (*)    Creatinine, Ser 1.61 (*)    GFR calc non Af Amer 54 (*)    GFR calc Af Amer 62 (*)    All other components within normal limits  URINALYSIS, ROUTINE W REFLEX MICROSCOPIC - Abnormal; Notable for the following:    Bilirubin Urine SMALL (*)    Protein, ur >300 (*)    All other components within normal limits  LIPASE, BLOOD  URINE  MICROSCOPIC-ADD ON  URINE RAPID DRUG SCREEN (HOSP PERFORMED)  URINE RAPID DRUG SCREEN (HOSP PERFORMED)   Imaging Review Ct Abdomen Pelvis W Contrast  08/30/2013   CLINICAL DATA:  Abdominal pain, vomiting.  EXAM: CT ABDOMEN AND PELVIS WITH CONTRAST  TECHNIQUE: Multidetector CT imaging of the abdomen and pelvis was performed using the standard protocol following bolus administration of intravenous contrast.  CONTRAST:  80mL OMNIPAQUE IOHEXOL 300 MG/ML  SOLN  COMPARISON:  DG ABD ACUTE W/CHEST dated 08/28/2013; CT ABD/PELVIS W CM dated 08/26/2013  FINDINGS: Small hiatal hernia. Heart is upper limits normal in size. Ground-glass opacities in the lung bases likely reflect atelectasis. No effusions.  Prior cholecystectomy. Liver, spleen, pancreas, adrenals and left kidney are unremarkable. 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 No hydronephrosis.  Appendix is visualized and is normal. Stomach, large and small bowel are grossly unremarkable. No free fluid, free air or adenopathy.  No acute bony abnormality. Degenerative changes in the lower lumbar spine.  IMPRESSION: No significant change since recent study. No evidence of bowel obstruction. No acute findings.   Electronically Signed   By: Charlett Nose [name]   On: 08/30/2013 15:30     EKG Interpretation None      MDM   Final diagnoses:  Nausea and vomiting  Abdominal pain    Patient presenting with worsening abdominal  pain, third visit for the same in 4 days. He did schedule followup visit with GI on April 8. Patient appears uncomfortable but in no apparent distress. No peritoneal signs on exam. Labs pending. Given patient's symptoms are worsening, unable to keep anything down, will repeat CT scan. Pain, nausea medicine given. Case discussed with attending Dr. Deretha Emory who agrees with plan of care. 4:01 PM Pt resting comfortably, states his pain is still present, however has improved from arrival. CT scan without any changes. Plan to give PO challenge and re-assess. 4:30 PM Patient unable to tolerate PO, pain worsened, states he is very uncomfortable. Will consult for admission.  Admission accepted by Dr. Arthor Captain, [facility].  Trevor Mace, PA-C 08/30/13 1746

## 2013-08-31 ENCOUNTER — Encounter (HOSPITAL_COMMUNITY): Payer: Self-pay | Admitting: *Deleted

## 2013-08-31 LAB — HEMOGLOBIN A1C
Hgb A1c MFr Bld: 7.9 % — ABNORMAL HIGH (ref <5.7)
MEAN PLASMA GLUCOSE: 180 mg/dL — AB (ref <117)

## 2013-08-31 LAB — CBC
HEMATOCRIT: 31.6 % — AB (ref 39.0–52.0)
Hemoglobin: 11.1 g/dL — ABNORMAL LOW (ref 13.0–17.0)
MCH: 30.4 pg (ref 26.0–34.0)
MCHC: 35.1 g/dL (ref 30.0–36.0)
MCV: 86.6 fL (ref 78.0–100.0)
Platelets: 199 10*3/uL (ref 150–400)
RBC: 3.65 MIL/uL — AB (ref 4.22–5.81)
RDW: 13.8 % (ref 11.5–15.5)
WBC: 11.1 10*3/uL — AB (ref 4.0–10.5)

## 2013-08-31 LAB — GLUCOSE, CAPILLARY
GLUCOSE-CAPILLARY: 139 mg/dL — AB (ref 70–99)
GLUCOSE-CAPILLARY: 138 mg/dL — AB (ref 70–99)

## 2013-08-31 LAB — BASIC METABOLIC PANEL
BUN: 14 mg/dL (ref 6–23)
CHLORIDE: 101 meq/L (ref 96–112)
CO2: 28 meq/L (ref 19–32)
Calcium: 8.7 mg/dL (ref 8.4–10.5)
Creatinine, Ser: 1.41 mg/dL — ABNORMAL HIGH (ref 0.50–1.35)
GFR calc Af Amer: 73 mL/min — ABNORMAL LOW (ref >90)
GFR calc non Af Amer: 63 mL/min — ABNORMAL LOW (ref >90)
GLUCOSE: 93 mg/dL (ref 70–99)
Potassium: 3.7 mEq/L (ref 3.7–5.3)
SODIUM: 138 meq/L (ref 137–147)

## 2013-08-31 LAB — TROPONIN I: Troponin I: <0.3 ng/mL (ref <0.30)

## 2013-08-31 LAB — TSH: TSH: 1.012 u[IU]/mL (ref 0.350–4.500)

## 2013-08-31 MED ORDER — OMEPRAZOLE 20 MG PO CPDR: 40.0000 mg | DELAYED_RELEASE_CAPSULE | Freq: Every day | ORAL | Status: DC

## 2013-08-31 MED ORDER — METOCLOPRAMIDE HCL 5 MG PO TABS
5.0000 mg | ORAL_TABLET | Freq: Three times a day (TID) | ORAL | Status: DC
  Administered 2013-08-31: 5 mg via ORAL
  Filled 2013-08-31 (×3): qty 1

## 2013-08-31 MED ORDER — PROMETHAZINE HCL 25 MG PO TABS: 25.0000 mg | ORAL_TABLET | Freq: Four times a day (QID) | ORAL | Status: DC | PRN

## 2013-08-31 MED ORDER — METOCLOPRAMIDE HCL 5 MG PO TABS: 5.0000 mg | ORAL_TABLET | Freq: Three times a day (TID) | ORAL | Status: DC

## 2013-08-31 MED ORDER — PANTOPRAZOLE SODIUM 40 MG PO TBEC: 40.0000 mg | DELAYED_RELEASE_TABLET | Freq: Every day | ORAL | Status: DC

## 2013-08-31 NOTE — Plan of Care (Signed)
Problem: Food- and Nutrition-Related Knowledge Deficit (NB-1.1) Goal: Nutrition education Formal process to instruct or train a patient/client in a skill or to impart knowledge to help patients/clients voluntarily manage or modify food choices and eating behavior to maintain or improve health. Outcome: Completed/Met Date Met:  08/31/13 Brief Nutrition Note  RD consulted for nutrition education regarding gastroparesis. RD provided "Gastroparesis Nutrition Therapy" handout from the [employer]. Discussed different food groups and their fiber and fat content. Provided list of goods to eat and foods to avoid. Encouraged small, frequent meals with adequate hydration. Teach back method used.  Expect good compliance.  Body mass index is 32.99 kg/(m^2). Pt meets criteria for obese weight based on current BMI.  Current diet order is Carbohydrate Modified Medium, patient is consuming approximately 50% of meals at this time. Labs and medications reviewed. No further nutrition interventions warranted at this time. RD contact information provided. If additional nutrition issues arise, please re-consult RD.  Curtis Coke MS, RD, LDN Pager: [phone] After-hours pager: [phone]

## 2013-08-31 NOTE — Progress Notes (Signed)
Patient discharge teaching given, including activity, diet, follow-up appoints, and medications. Patient verbalized understanding of all discharge instructions. IV access was d/c'd. Vitals are stable. Skin is intact except as charted in most recent assessments. Pt to be escorted out by NT, to be driven home by family. 

## 2013-08-31 NOTE — Discharge Summary (Signed)
Physician Discharge Summary  [name] KVQ:[id number] DDennie FettersOB: 09/21/1975 DOA: 08/30/2013  PCP: Dorrene GermanAVBUERE,Curtis A, MD  Admit date: 08/30/2013 Discharge date: 08/31/2013  Time spent: 40 minutes  Recommendations for Outpatient Follow-up:  Recommendations for Outpatient Follow-up:  1. Please obtain a gastric emptying study in the outpatient setting. 2. Minimize narcotics 3. Optimize diabetic control.  Discharge Diagnoses:   Nausea and vomiting  -Resolved- Patient tolerated breakfast without vomiting. -Intractable nausea and vomiting, reported for the past 6 days. Likely 2/2 gastroperesis, will need a gastric emptying study done in the outpatient setting. Educated extensively regarding small portion meals. nutrition consultation obtained for further diabetic education. Patient has been asked to stop narcotic use, educated that narcotics could worsen gastroparesis.  -CT of abdomen pelvis unremarkable aside from small hiatal hernia -Continue PPI and Reglan  Epigastric pain  -Likely secondary to gastroperesis 2/2 diabetes -Patient will benefit from gastric emptying study to be done as outpatient   Hypertension  -Blood pressure in the high side, last blood pressure was 189/102.  -This could be likely secondary to patient not taking blood pressure medicines secondary to nausea and vomiting.  -Lisinopril restarted  Diabetes mellitus  -Hemoglobin A1c 7.9 -Continue carbohydrate modified diet -Resume metformin   CKD stage II  -Likely secondary to diabetes and hypertension -Creatinine stable at baseline -Restart lisinopril  Discharge Condition: Stable  Diet recommendation: Carb Modified  Filed Weights   08/30/13 1100 08/30/13 1931  Weight: 113.399 kg (250 lb) 113.399 kg (250 lb)    History of present illness:  Today patient feels improved. He has not had any vomiting since yesterday and tolerated his breakfast well. He was educated on gastroparesis and eating small meals throughout  the day. He currently denies any abdominal pain, chest pain, SOB.   Hospital Course:  Curtis FettersLamont Clark is a 38 y.o. 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 Patient came in to the hospital because of nausea and vomiting. This is a third time he came in to the ED within 4 days. Patient reported about 6 days ago he developed nausea and vomiting associated with some epigastric abdominal pain. Denies any diarrhea. Symptoms continued to worsen seen in the emergency department about 4 days ago and CT scan was done and showed hiatal hernia with some gastric thickening which is probably secondary to gastritis and recommended to followup with GI. Patient to schedule GI appointment on April 8. Meanwhile he continues to have symptoms so he came into the ED 2 days ago and was sent home on nausea medicines, he came in again to the hospital reporting that he could not keep anything since about 6 days ago. In the ED CT scan showed no acute findings, lipase is 30, normal WBCs, normal potassium, normal BUN and creatinine is 1.6 which is at baseline. Patient challenge in the ED with some ginger ale but he and reported severe nausea/vomiting and abdominal pain. Patient will be admitted to the hospital overnight for observation.   Procedures:  None  Consultations: None  Discharge Exam: Filed Vitals:   08/31/13 1004  BP: 147/95  Pulse:   Temp:   Resp:    Constitutional: Oriented to person, place, and time. Well-developed and well-nourished. Cooperative.  Head: Normocephalic and atraumatic.  Mouth/Throat: MMM Eyes:  EOM are normal. Pupils are equal, round, and reactive to light.  Neck: Neck supple.  Cardiovascular: Normal rate, regular rhythm, S1 normal, S2 normal. Pulmonary/Chest: Effort normal and breath  sounds normal.  Abdominal: Soft. Bowel sounds are normal. There is no hepatosplenomegaly. There is no tenderness.   Musculoskeletal: Normal range of motion. bilateral great toe amputation  Neurological: Alert and oriented to person, place, and time. He has normal strength. No cranial nerve deficit or sensory deficit.  Skin: Skin is warm, dry and intact.  Psychiatric: Has a normal mood and affect. Speech is normal and behavior is normal.    Discharge Instructions     Discharge Orders   Future Orders Complete By Expires   Call MD for:  persistant nausea and vomiting  As directed    Diet - low sodium heart healthy  As directed    Diet general  As directed    Increase activity slowly  As directed        Medication List    STOP taking these medications       HYDROcodone-acetaminophen 5-325 MG per tablet  Commonly known as:  NORCO/VICODIN     ondansetron 4 MG tablet  Commonly known as:  ZOFRAN      TAKE these medications       famotidine 20 MG tablet  Commonly known as:  PEPCID  Take 1 tablet (20 mg total) by mouth 2 (two) times daily.     lisinopril 10 MG tablet  Commonly known as:  PRINIVIL,ZESTRIL  Take 10 mg by mouth daily.     metFORMIN 500 MG tablet  Commonly known as:  GLUCOPHAGE  Take 500 mg by mouth 2 (two) times daily with a meal.     metoCLOPramide 5 MG tablet  Commonly known as:  REGLAN  Take 1 tablet (5 mg total) by mouth 3 (three) times daily before meals.     omeprazole 20 MG capsule  Commonly known as:  PRILOSEC  Take 2 capsules (40 mg total) by mouth daily.     promethazine 25 MG tablet  Commonly known as:  PHENERGAN  Take 1 tablet (25 mg total) by mouth every 6 (six) hours as needed for nausea or vomiting.       No Known Allergies Follow-up Information   Follow up with AVBUERE,Curtis A, MD. Schedule an appointment as soon as possible for a visit in 1 week.   Specialty:  Internal Medicine   Contact information:   [address] [place] [address] [phone]        The results of significant diagnostics from this hospitalization (including  imaging, microbiology, ancillary and laboratory) are listed below for reference.    Significant Diagnostic Studies: Ct Abdomen Pelvis W Contrast  08/30/2013   CLINICAL DATA:  Abdominal pain, vomiting.  EXAM: CT ABDOMEN AND PELVIS WITH CONTRAST  TECHNIQUE: Multidetector CT imaging of the abdomen and pelvis was performed using the standard protocol following bolus administration of intravenous contrast.  CONTRAST:  80mL OMNIPAQUE IOHEXOL 300 MG/ML  SOLN  COMPARISON:  DG ABD ACUTE W/CHEST dated 08/28/2013; CT ABD/PELVIS W CM dated 08/26/2013  FINDINGS: Small hiatal hernia. Heart is upper limits normal in size. Ground-glass opacities in the lung bases likely reflect atelectasis. No effusions.  Prior cholecystectomy. Liver, spleen, pancreas, adrenals and left kidney are unremarkable. 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 No hydronephrosis.  Appendix is visualized and is normal. Stomach, large and small bowel are grossly unremarkable. No free fluid, free air or adenopathy.  No acute bony abnormality. Degenerative changes in the lower lumbar spine.  IMPRESSION: No significant change since recent study. No evidence of bowel obstruction. No acute findings.  Electronically Signed   By: Charlett Nose [name]   On: 08/30/2013 15:30   Ct Abdomen Pelvis W Contrast  08/26/2013   CLINICAL DATA:  Nausea, vomiting, generalized abdominal pain, history hypertension, diabetes, remote cholecystectomy in 2012.  EXAM: CT ABDOMEN AND PELVIS WITH CONTRAST  TECHNIQUE: Multidetector CT imaging of the abdomen and pelvis was performed using the standard protocol following bolus administration of intravenous contrast. Sagittal and coronal MPR images reconstructed from axial data set.  CONTRAST:  OMNIPAQUE IOHEXOL 300 MG/ML SOLN IV. Dilute oral contrast.  COMPARISON:  09/14/2012  FINDINGS: Dependent atelectasis right lower lobe.  Post cholecystectomy.  Ptotic and malrotated right kidney.  Tiny umbilical hernia containing fat.   Liver, spleen, pancreas, kidneys, and adrenal glands otherwise normal appearance.  Normal appendix.  Minimal bladder wall thickening similar to previous exam.  Small hiatal hernia with wall thickening.  Stomach and bowel loops otherwise normal appearance.  No mass, adenopathy, free fluid, or inflammatory process.  Degenerative disc disease changes at L4-L5 and L5-S1.  IMPRESSION: Ptotic and malrotated right kidney.  Small hiatal hernia with questionable wall thickening of the distal esophagus and hernia sac ; consider routine follow-up endoscopy or upper GI exam to exclude mass, gastritis and distal esophagitis.  No other definite intra-abdominal or intrapelvic abnormalities.   Electronically Signed   By: Ulyses Southward [name]   On: 08/26/2013 20:45   Dg Abd Acute W/chest  08/28/2013   CLINICAL DATA:  Shortness of breath, mid abdominal pain, nausea  EXAM: ACUTE ABDOMEN SERIES (ABDOMEN 2 VIEW & CHEST 1 VIEW)  COMPARISON:  CT abdomen pelvis dated 08/26/2013  FINDINGS: Lungs are clear. No pleural effusion or pneumothorax.  The heart is normal in size.  Nonobstructive bowel gas pattern. Residual contrast/stool in the left colon.  No evidence of free air under the diaphragm on the upright view.  Cholecystectomy clips.  Visualized osseous structures are within normal limits.  IMPRESSION: No evidence of acute cardiopulmonary disease.  No evidence of small bowel obstruction or free air.  Residual contrast in the left colon.   Electronically Signed   By: Charline Bills [name]   On: 08/28/2013 10:46   Dg Abd Acute W/chest  08/26/2013   CLINICAL DATA:  Abdominal pain and vomiting  EXAM: ACUTE ABDOMEN SERIES (ABDOMEN 2 VIEW & CHEST 1 VIEW)  COMPARISON:  DG CHEST 2 VIEW dated 09/16/2012  FINDINGS: Normal cardiac silhouette. Lungs are clear. No free air beneath hemidiaphragms.  No dilated loops of large or small bowel. Moderate volume stool throughout the colon. Stool and gas in the rectum. No pathologic calcifications. Prior  cholecystectomy.  IMPRESSION: 1.  No acute cardiopulmonary process.  . 2. No bowel obstruction or intraperitoneal free air.   Electronically Signed   By: Genevive Bi [name]   On: 08/26/2013 18:50    Microbiology: Recent Results (from the past 240 hour(s))  URINE CULTURE     Status: None   Collection Time    08/26/13  7:20 PM      Result Value Ref Range Status   Specimen Description URINE, RANDOM   Final   Special Requests NONE   Final   Culture  Setup Time     Final   Value: 08/27/2013 01:12     Performed at [facility] Count     Final   Value: NO GROWTH     Performed at [facility]   Culture     Final   Value:  NO GROWTH     Performed at [facility]   Report Status 08/28/2013 FINAL   Final  URINE CULTURE     Status: None   Collection Time    08/28/13 10:02 AM      Result Value Ref Range Status   Specimen Description URINE, CLEAN CATCH   Final   Special Requests NONE   Final   Culture  Setup Time     Final   Value: 08/28/2013 14:52     Performed at [facility] Count     Final   Value: NO GROWTH     Performed at [facility]   Culture     Final   Value: NO GROWTH     Performed at [facility]   Report Status 08/29/2013 FINAL   Final     Labs: Basic Metabolic Panel:  Recent Labs Lab 08/26/13 1750 08/28/13 1017 08/30/13 1203 08/31/13 0129  NA 139 139 137 138  K 5.3 4.0 4.2 3.7  CL 100 99 99 101  CO2 22 26 27 28   GLUCOSE 190* 213* 183* 93  BUN 23 18 16 14   CREATININE 1.53* 1.62* 1.61* 1.41*  CALCIUM 10.0 9.6 9.3 8.7   Liver Function Tests:  Recent Labs Lab 08/26/13 1750 08/28/13 1017 08/30/13 1203  AST 33 17 16  ALT 22 15 13   ALKPHOS 96 90 87  BILITOT 0.3 0.6 0.3  PROT 8.7* 8.1 8.1  ALBUMIN 3.8 3.6 3.6    Recent Labs Lab 08/26/13 1750 08/28/13 1017 08/30/13 1203  LIPASE 27 21 30   AMYLASE  --   --  119*   No results found for this basename: AMMONIA,  in the last 168  hours CBC:  Recent Labs Lab 08/26/13 1750 08/28/13 1017 08/30/13 1203 08/31/13 0129  WBC 10.5 11.1* 9.8 11.1*  NEUTROABS 8.3* 8.8* 7.8*  --   HGB 12.4* 11.5* 12.4* 11.1*  HCT 36.6* 34.0* 35.4* 31.6*  MCV 86.5 87.2 86.8 86.6  PLT 204 203 215 199   Cardiac Enzymes:  Recent Labs Lab 08/30/13 2105 08/31/13 0129 08/31/13 0745  TROPONINI <0.30 <0.30 <0.30  CBG:  Recent Labs Lab 08/26/13 1748 08/30/13 2116 08/31/13 0742  GLUCAP 155* 104* 139*       Signed:  Werner,Kate PA-S [facility] 08/31/2013, 11:34 AM  Seen and examined, 38 year old long-standing diabetic admitted with recurrent nausea and vomiting. Suspected to have gastroparesis, started empirically on Reglan. Has significantly improved, has tolerated a regular diet without any major issues. He is stable for discharge. He has been educated regarding gastroprotective diet and importance of a small portion meal. Since he is already on Reglan, unable to proceed with a gastric emptying study while he is here, suspect this can be done in the outpatient setting. He has also been educated about the importance of minimizing or stopping narcotic use altogether. At this point since he has tolerated a regular diet for breakfast, he has no further nausea or vomiting he is stable to be discharged home.  Windell Norfolk MD

## 2013-08-31 NOTE — Progress Notes (Signed)
Pt arrived to unit via stretcher from ED. Pt was oriented to room and call bell system. Pt in no apparent distress at this time. Safety video has been turned on. Admission orders have been reviewed & VSS. RN will continue to monitor pt.

## 2013-09-01 NOTE — Care Management Note (Signed)
    Page 1 of 1   09/01/2013     10:59:52 AM   CARE MANAGEMENT NOTE 09/01/2013  Patient:  Curtis FettersSOLOMON,Albertus   Account Number:  [id number]  Date Initiated:  09/01/2013  Documentation initiated by:  Letha CapeAYLOR,Easten Maceachern  Subjective/Objective Assessment:   dx nausea  admit as observation- from home.     Action/Plan:   Anticipated DC Date:  08/31/2013   Anticipated DC Plan:  HOME/SELF CARE      DC Planning Services  CM consult      Choice offered to / List presented to:             Status of service:  Completed, signed off Medicare Important Message given?   (If response is "NO", the following Medicare IM given date fields will be blank) Date Medicare IM given:   Date Additional Medicare IM given:    Discharge Disposition:  HOME/SELF CARE  Per UR Regulation:  Reviewed for med. necessity/level of care/duration of stay  If discussed at Long Length of Stay Meetings, dates discussed:    Comments:

## 2013-09-22 ENCOUNTER — Other Ambulatory Visit (HOSPITAL_COMMUNITY): Payer: Self-pay | Admitting: Gastroenterology

## 2013-09-22 DIAGNOSIS — R112 Nausea with vomiting, unspecified: Secondary | ICD-10-CM

## 2013-09-30 ENCOUNTER — Ambulatory Visit (HOSPITAL_COMMUNITY)
Admission: RE | Admit: 2013-09-30 | Discharge: 2013-09-30 | Disposition: A | Discharge status: Home / Self Care | Payer: Medicaid Other | Source: Ambulatory Visit | Attending: Gastroenterology | Admitting: Gastroenterology

## 2013-09-30 DIAGNOSIS — R1013 Epigastric pain: Principal | ICD-10-CM

## 2013-09-30 DIAGNOSIS — R112 Nausea with vomiting, unspecified: Secondary | ICD-10-CM | POA: Insufficient documentation

## 2013-09-30 DIAGNOSIS — K3189 Other diseases of stomach and duodenum: Secondary | ICD-10-CM | POA: Insufficient documentation

## 2013-09-30 DIAGNOSIS — R109 Unspecified abdominal pain: Secondary | ICD-10-CM | POA: Insufficient documentation

## 2013-09-30 MED ORDER — TECHNETIUM TC 99M SULFUR COLLOID
2.0000 | Freq: Once | INTRAVENOUS | Status: AC | PRN
  Administered 2013-09-30: 2 via INTRAVENOUS

## 2013-11-07 ENCOUNTER — Other Ambulatory Visit (HOSPITAL_COMMUNITY): Payer: Self-pay | Admitting: Internal Medicine

## 2013-11-07 ENCOUNTER — Ambulatory Visit (HOSPITAL_COMMUNITY)
Admission: RE | Admit: 2013-11-07 | Discharge: 2013-11-07 | Disposition: A | Discharge status: Home / Self Care | Payer: Medicaid Other | Source: Ambulatory Visit | Attending: Internal Medicine | Admitting: Internal Medicine

## 2013-11-07 DIAGNOSIS — R52 Pain, unspecified: Secondary | ICD-10-CM

## 2013-11-07 DIAGNOSIS — W208XXA Other cause of strike by thrown, projected or falling object, initial encounter: Secondary | ICD-10-CM | POA: Diagnosis not present

## 2013-11-07 DIAGNOSIS — S92919A Unspecified fracture of unspecified toe(s), initial encounter for closed fracture: Secondary | ICD-10-CM | POA: Diagnosis not present

## 2013-11-07 DIAGNOSIS — M79609 Pain in unspecified limb: Secondary | ICD-10-CM | POA: Diagnosis present

## 2013-11-07 DIAGNOSIS — Y929 Unspecified place or not applicable: Secondary | ICD-10-CM | POA: Insufficient documentation

## 2013-11-14 ENCOUNTER — Encounter (HOSPITAL_COMMUNITY): Payer: Self-pay | Admitting: Emergency Medicine

## 2013-11-14 ENCOUNTER — Emergency Department (HOSPITAL_COMMUNITY)
Admission: EM | Admit: 2013-11-14 | Discharge: 2013-11-14 | Disposition: A | Discharge status: Home / Self Care | Payer: Medicaid Other | Source: Home / Self Care | Attending: Family Medicine | Admitting: Family Medicine

## 2013-11-14 ENCOUNTER — Emergency Department (INDEPENDENT_AMBULATORY_CARE_PROVIDER_SITE_OTHER): Payer: Medicaid Other

## 2013-11-14 DIAGNOSIS — M79609 Pain in unspecified limb: Secondary | ICD-10-CM

## 2013-11-14 DIAGNOSIS — M79676 Pain in unspecified toe(s): Secondary | ICD-10-CM

## 2013-11-14 MED ORDER — HYDROCODONE-ACETAMINOPHEN 5-325 MG PO TABS: 2.0000 | ORAL_TABLET | ORAL | Status: DC | PRN

## 2013-11-14 MED ORDER — CEPHALEXIN 500 MG PO CAPS: 500.0000 mg | ORAL_CAPSULE | Freq: Four times a day (QID) | ORAL | Status: DC

## 2013-11-14 NOTE — Discharge Instructions (Signed)
Wound Infection  A wound infection happens when a type of germ (bacteria) starts growing in the wound. In some cases, this can cause the wound to break open. If cared for properly, the infected wound will heal from the inside to the outside. Wound infections need treatment.  CAUSES  An infection is caused by bacteria growing in the wound.   SYMPTOMS    Increase in redness, swelling, or pain at the wound site.   Increase in drainage at the wound site.   Wound or bandage (dressing) starts to smell bad.   Fever.   Feeling tired or fatigued.   Pus draining from the wound.  TREATMENT   You caregiver will prescribe antibiotic medicine. The wound infection should improve within 24 to 48 hours. Any redness around the wound should stop spreading and the wound should be less painful.   HOME CARE INSTRUCTIONS    Only take over-the-counter or prescription medicines for pain, discomfort, or fever as directed by your caregiver.   Take your antibiotics as directed. Finish them even if you start to feel better.   Gently wash the area with mild soap and water 2 times a day, or as directed. Rinse off the soap. Pat the area dry with a clean towel. Do not rub the wound. This may cause bleeding.   Follow your caregiver's instructions for how often you need to change the dressing.   Apply ointment and a dressing to the wound as directed.   If the dressing sticks, moisten it with soapy water and gently remove it.   Change the bandage right away if it becomes wet, dirty, or develops a bad smell.   Take showers. Do not take tub baths, swim, or do anything that may soak the wound until it is healed.   Avoid exercises that make you sweat heavily.   Use anti-itch medicine as directed by your caregiver. The wound may itch when it is healing. Do not pick or scratch at the wound.   Follow up with your caregiver to get your wound rechecked as directed.  SEEK MEDICAL CARE IF:   You have an increase in swelling, pain, or redness  around the wound.   You have an increase in the amount of pus coming from the wound.   There is a bad smell coming from the wound.   More of the wound breaks open.   You have a fever.  MAKE SURE YOU:    Understand these instructions.   Will watch your condition.   Will get help right away if you are not doing well or get worse.  Document Released: 02/22/2003 Document Revised: 08/18/2011 Document Reviewed: 09/29/2010  ExitCare Patient Information 2014 [employer].

## 2013-11-14 NOTE — ED Provider Notes (Signed)
CSN: [id number]     Arrival date & time 11/14/13  0935 History   First MD Initiated Contact with Patient 11/14/13 1010     Chief Complaint  Patient presents with  . Foot Injury   (Consider location/radiation/quality/duration/timing/severity/associated sxs/prior Treatment) Patient is a 38 y.o. male presenting with foot injury. The history is provided by the patient. No language interpreter was used.  Foot Injury Location:  Foot Injury: no   Foot location:  R foot Pain details:    Quality:  Aching   Radiates to:  Does not radiate   Severity:  Moderate   Onset quality:  Gradual   Timing:  Constant   Progression:  Worsening Chronicity:  Recurrent Foreign body present:  No foreign bodies Pt has had 2 toes amputated due to infection.  Pt is diabetic.   Past Medical History  Diagnosis Date  . Hypertension   . Diabetes mellitus     type 2 iddm x 18 yrs  . Vascular disease     poor circulation to left foot  . Acute osteomyelitis, ankle and foot 07/30/2010    Qualifier: Diagnosis of  By: Daiva Eves MD, Remi Haggard    . Anemia   . Renal insufficiency   . Hiatal hernia   . DDD (degenerative disc disease), lumbar   . Pancreatitis    Past Surgical History  Procedure Laterality Date  . Cholecystectomy    . Toe amputation  2012    left foot; great toe and second toe  . Amputation  04/15/2011    Procedure: AMPUTATION RAY;  Surgeon: Nadara Mustard, MD;  Location: [facility] OR;  Service: Orthopedics;  Laterality: Left;  left foot third toe amputation and MPP joint and gastroc resection VS. achilles lengthing   . Amputation  04/25/2011    Procedure: AMPUTATION DIGIT;  Surgeon: Nadara Mustard, MD;  Location: [facility] OR;  Service: Orthopedics;  Laterality: Left;  Left foot 3rd toe amputation MTP joint, Gastroc Recession  Achilles Lengthening   . Amputation Bilateral 03/25/2013    Procedure: AMPUTATION RAY;  Surgeon: Nadara Mustard, MD;  Location: [facility] OR;  Service: Orthopedics;  Laterality: Bilateral;  Left Great  Toe Amputation at  MTP Joint, Right 1st and 2nd Ray Amputation    History reviewed. No pertinent family history. History  Substance Use Topics  . Smoking status: Current Every Day Smoker -- 0.25 packs/day for 1 years    Types: Cigarettes  . Smokeless tobacco: Never Used  . Alcohol Use: No    Review of Systems  Musculoskeletal: Positive for joint swelling.  All other systems reviewed and are negative.   Allergies  Review of patient's allergies indicates no known allergies.  Home Medications   Prior to Admission medications   Medication Sig Start Date End Date Taking? Authorizing Provider  famotidine (PEPCID) 20 MG tablet Take 1 tablet (20 mg total) by mouth 2 (two) times daily. 08/26/13  Yes Tatyana A Kirichenko, PA-C  lisinopril (PRINIVIL,ZESTRIL) 10 MG tablet Take 10 mg by mouth daily.   Yes Historical Provider, MD  metFORMIN (GLUCOPHAGE) 500 MG tablet Take 500 mg by mouth 2 (two) times daily with a meal.   Yes Historical Provider, MD  metoCLOPramide (REGLAN) 5 MG tablet Take 1 tablet (5 mg total) by mouth 3 (three) times daily before meals. 08/31/13  Yes Shanker Levora Dredge, MD  omeprazole (PRILOSEC) 20 MG capsule Take 2 capsules (40 mg total) by mouth daily. 08/31/13  Yes Shanker Levora Dredge, MD  promethazine [facility])  25 MG tablet Take 1 tablet (25 mg total) by mouth every 6 (six) hours as needed for nausea or vomiting. 08/31/13  Yes Shanker Levora DredgeM Ghimire, MD  metoCLOPramide (REGLAN) 10 MG tablet Take 1 tablet (10 mg total) by mouth every 6 (six) hours as needed (nausea/headache). 06/15/11 06/25/11  Hurman HornJohn M Bednar, MD   BP 147/93  Pulse 76  Temp(Src) 97.6 F (36.4 C) (Oral)  Resp 12  SpO2 100% Physical Exam  Nursing note and vitals reviewed. Constitutional: He is oriented to person, place, and time. He appears well-developed and well-nourished.  HENT:  Head: Normocephalic.  Musculoskeletal: He exhibits tenderness.  Neurological: He is alert and oriented to person, place, and time. He  has normal reflexes.  Skin: Skin is warm.    ED Course  Procedures (including critical care time) Labs Review Labs Reviewed - No data to display  Imaging Review Dg Foot Complete Right  11/14/2013   CLINICAL DATA:  Dropped can on foot. Foot injury and pain. Diabetes and peripheral vascular disease.  EXAM: RIGHT FOOT COMPLETE - 3+ VIEW  COMPARISON:  11/07/2013  FINDINGS: Avulsion fracture fragment is again seen along the dorsal aspect of the proximal interphalangeal joint of the third toe which is unchanged since previous exam. No new fracture or dislocation identified.  Previous amputation of the first and second toes is demonstrated. Periosteal thickening is again seen involving second and third metatarsals as well as the proximal phalanges of the third and fourth toes which is stable. Chronic osteomyelitis cannot be excluded.  IMPRESSION: No new findings.  Stable appearance of small avulsion fracture fragment along the dorsal aspect of the the PIP joint of the third toe.  Stable periosteal thickening involving the second and third metatarsals and proximal phalanges of the third and fourth toes; chronic osteomyelitis cannot be excluded.   Electronically Signed   By: Myles RosenthalJohn  Stahl [name]   On: 11/14/2013 10:14     MDM   1. Toe pain    Pt counseled on possible osteo.   Pt advised to see Dr. Lajoyce Cornersuda for recheck. rx for keflex hydrocodone   Elson AreasLeslie K Sofia, PA-C 11/14/13 1113

## 2013-11-14 NOTE — ED Notes (Signed)
Reports dropping something on right foot x 1 wk ago.  Still having pain with walking.  Mild swelling.    No otc meds taken for pain.  Hx of DM.

## 2013-11-15 NOTE — ED Provider Notes (Signed)
Medical screening examination/treatment/procedure(s) were performed by a resident physician or non-physician practitioner and as the supervising physician I was immediately available for consultation/collaboration.  Ruford Dudzinski, MD    Angellynn Kimberlin S Syna Gad, MD 11/15/13 0748 

## 2013-11-21 ENCOUNTER — Encounter (HOSPITAL_COMMUNITY): Payer: Self-pay | Admitting: Emergency Medicine

## 2013-11-21 ENCOUNTER — Encounter (HOSPITAL_BASED_OUTPATIENT_CLINIC_OR_DEPARTMENT_OTHER): Payer: Self-pay | Admitting: Emergency Medicine

## 2013-11-21 ENCOUNTER — Emergency Department (HOSPITAL_COMMUNITY)
Admission: EM | Admit: 2013-11-21 | Discharge: 2013-11-21 | Disposition: A | Discharge status: Home / Self Care | Payer: Medicaid Other | Attending: Emergency Medicine | Admitting: Emergency Medicine

## 2013-11-21 ENCOUNTER — Emergency Department (HOSPITAL_COMMUNITY): Payer: Medicaid Other

## 2013-11-21 ENCOUNTER — Emergency Department (HOSPITAL_BASED_OUTPATIENT_CLINIC_OR_DEPARTMENT_OTHER)
Admission: EM | Admit: 2013-11-21 | Discharge: 2013-11-22 | Disposition: A | Discharge status: Home / Self Care | Payer: Medicaid Other | Source: Home / Self Care | Attending: Emergency Medicine | Admitting: Emergency Medicine

## 2013-11-21 DIAGNOSIS — Z79899 Other long term (current) drug therapy: Secondary | ICD-10-CM

## 2013-11-21 DIAGNOSIS — R1011 Right upper quadrant pain: Secondary | ICD-10-CM | POA: Diagnosis not present

## 2013-11-21 DIAGNOSIS — R112 Nausea with vomiting, unspecified: Secondary | ICD-10-CM

## 2013-11-21 DIAGNOSIS — S98139A Complete traumatic amputation of one unspecified lesser toe, initial encounter: Secondary | ICD-10-CM | POA: Insufficient documentation

## 2013-11-21 DIAGNOSIS — M79609 Pain in unspecified limb: Secondary | ICD-10-CM | POA: Insufficient documentation

## 2013-11-21 DIAGNOSIS — Z87448 Personal history of other diseases of urinary system: Secondary | ICD-10-CM | POA: Insufficient documentation

## 2013-11-21 DIAGNOSIS — E119 Type 2 diabetes mellitus without complications: Secondary | ICD-10-CM | POA: Diagnosis not present

## 2013-11-21 DIAGNOSIS — I10 Essential (primary) hypertension: Secondary | ICD-10-CM | POA: Insufficient documentation

## 2013-11-21 DIAGNOSIS — F17200 Nicotine dependence, unspecified, uncomplicated: Secondary | ICD-10-CM | POA: Insufficient documentation

## 2013-11-21 DIAGNOSIS — R109 Unspecified abdominal pain: Secondary | ICD-10-CM

## 2013-11-21 DIAGNOSIS — G8929 Other chronic pain: Secondary | ICD-10-CM | POA: Diagnosis not present

## 2013-11-21 DIAGNOSIS — Z792 Long term (current) use of antibiotics: Secondary | ICD-10-CM | POA: Insufficient documentation

## 2013-11-21 DIAGNOSIS — R1013 Epigastric pain: Secondary | ICD-10-CM

## 2013-11-21 DIAGNOSIS — S98119A Complete traumatic amputation of unspecified great toe, initial encounter: Secondary | ICD-10-CM | POA: Diagnosis not present

## 2013-11-21 DIAGNOSIS — Z862 Personal history of diseases of the blood and blood-forming organs and certain disorders involving the immune mechanism: Secondary | ICD-10-CM | POA: Insufficient documentation

## 2013-11-21 DIAGNOSIS — M79673 Pain in unspecified foot: Secondary | ICD-10-CM

## 2013-11-21 DIAGNOSIS — Z8719 Personal history of other diseases of the digestive system: Secondary | ICD-10-CM | POA: Diagnosis not present

## 2013-11-21 DIAGNOSIS — M5137 Other intervertebral disc degeneration, lumbosacral region: Secondary | ICD-10-CM

## 2013-11-21 DIAGNOSIS — M51379 Other intervertebral disc degeneration, lumbosacral region without mention of lumbar back pain or lower extremity pain: Secondary | ICD-10-CM | POA: Insufficient documentation

## 2013-11-21 HISTORY — DX: Gastroparesis: K31.84

## 2013-11-21 LAB — CBC WITH DIFFERENTIAL/PLATELET
BASOS PCT: 0 % (ref 0–1)
Basophils Absolute: 0 10*3/uL (ref 0.0–0.1)
Basophils Absolute: 0 10*3/uL (ref 0.0–0.1)
Basophils Relative: 0 % (ref 0–1)
EOS ABS: 0.1 10*3/uL (ref 0.0–0.7)
EOS PCT: 2 % (ref 0–5)
Eosinophils Absolute: 0.2 10*3/uL (ref 0.0–0.7)
Eosinophils Relative: 1 % (ref 0–5)
HCT: 37.4 % — ABNORMAL LOW (ref 39.0–52.0)
HCT: 39.1 % (ref 39.0–52.0)
HEMOGLOBIN: 13.4 g/dL (ref 13.0–17.0)
Hemoglobin: 12.5 g/dL — ABNORMAL LOW (ref 13.0–17.0)
LYMPHS ABS: 0.9 10*3/uL (ref 0.7–4.0)
LYMPHS PCT: 33 % (ref 12–46)
Lymphocytes Relative: 11 % — ABNORMAL LOW (ref 12–46)
Lymphs Abs: 2.3 10*3/uL (ref 0.7–4.0)
MCH: 29.8 pg (ref 26.0–34.0)
MCH: 30.9 pg (ref 26.0–34.0)
MCHC: 33.4 g/dL (ref 30.0–36.0)
MCHC: 34.3 g/dL (ref 30.0–36.0)
MCV: 89.3 fL (ref 78.0–100.0)
MCV: 90.1 fL (ref 78.0–100.0)
MONOS PCT: 5 % (ref 3–12)
Monocytes Absolute: 0.5 10*3/uL (ref 0.1–1.0)
Monocytes Absolute: 0.4 10*3/uL (ref 0.1–1.0)
Monocytes Relative: 7 % (ref 3–12)
NEUTROS ABS: 6.7 10*3/uL (ref 1.7–7.7)
NEUTROS PCT: 82 % — AB (ref 43–77)
Neutro Abs: 3.9 10*3/uL (ref 1.7–7.7)
Neutrophils Relative %: 58 % (ref 43–77)
PLATELETS: 220 10*3/uL (ref 150–400)
Platelets: 214 10*3/uL (ref 150–400)
RBC: 4.19 MIL/uL — ABNORMAL LOW (ref 4.22–5.81)
RBC: 4.34 MIL/uL (ref 4.22–5.81)
RDW: 13.3 % (ref 11.5–15.5)
RDW: 12.6 % (ref 11.5–15.5)
WBC: 6.9 10*3/uL (ref 4.0–10.5)
WBC: 8.2 10*3/uL (ref 4.0–10.5)

## 2013-11-21 LAB — HEPATIC FUNCTION PANEL
ALBUMIN: 4.1 g/dL (ref 3.5–5.2)
ALT: 19 U/L (ref 0–53)
AST: 19 U/L (ref 0–37)
Alkaline Phosphatase: 107 U/L (ref 39–117)
Total Bilirubin: 0.5 mg/dL (ref 0.3–1.2)
Total Protein: 8.8 g/dL — ABNORMAL HIGH (ref 6.0–8.3)

## 2013-11-21 LAB — COMPREHENSIVE METABOLIC PANEL
ALT: 18 U/L (ref 0–53)
AST: 17 U/L (ref 0–37)
Albumin: 3.8 g/dL (ref 3.5–5.2)
Alkaline Phosphatase: 106 U/L (ref 39–117)
BUN: 31 mg/dL — ABNORMAL HIGH (ref 6–23)
CALCIUM: 9.7 mg/dL (ref 8.4–10.5)
CO2: 26 meq/L (ref 19–32)
Chloride: 102 mEq/L (ref 96–112)
Creatinine, Ser: 1.82 mg/dL — ABNORMAL HIGH (ref 0.50–1.35)
GFR calc Af Amer: 53 mL/min — ABNORMAL LOW (ref >90)
GFR calc non Af Amer: 46 mL/min — ABNORMAL LOW (ref >90)
Glucose, Bld: 146 mg/dL — ABNORMAL HIGH (ref 70–99)
Potassium: 4.4 mEq/L (ref 3.7–5.3)
SODIUM: 138 meq/L (ref 137–147)
TOTAL PROTEIN: 8.5 g/dL — AB (ref 6.0–8.3)
Total Bilirubin: 0.2 mg/dL — ABNORMAL LOW (ref 0.3–1.2)

## 2013-11-21 LAB — URINE MICROSCOPIC-ADD ON

## 2013-11-21 LAB — URINALYSIS, ROUTINE W REFLEX MICROSCOPIC
BILIRUBIN URINE: NEGATIVE
GLUCOSE, UA: NEGATIVE mg/dL
Ketones, ur: NEGATIVE mg/dL
Leukocytes, UA: NEGATIVE
Nitrite: NEGATIVE
PH: 5 (ref 5.0–8.0)
Protein, ur: 100 mg/dL — AB
SPECIFIC GRAVITY, URINE: 1.022 (ref 1.005–1.030)
Urobilinogen, UA: 0.2 mg/dL (ref 0.0–1.0)

## 2013-11-21 LAB — BASIC METABOLIC PANEL
BUN: 28 mg/dL — AB (ref 6–23)
CHLORIDE: 101 meq/L (ref 96–112)
CO2: 25 mEq/L (ref 19–32)
Calcium: 9.8 mg/dL (ref 8.4–10.5)
Creatinine, Ser: 1.8 mg/dL — ABNORMAL HIGH (ref 0.50–1.35)
GFR, EST AFRICAN AMERICAN: 54 mL/min — AB (ref >90)
GFR, EST NON AFRICAN AMERICAN: 46 mL/min — AB (ref >90)
Glucose, Bld: 182 mg/dL — ABNORMAL HIGH (ref 70–99)
POTASSIUM: 4.8 meq/L (ref 3.7–5.3)
SODIUM: 138 meq/L (ref 137–147)

## 2013-11-21 LAB — LIPASE, BLOOD
Lipase: 35 U/L (ref 11–59)
Lipase: 24 U/L (ref 11–59)

## 2013-11-21 MED ORDER — MORPHINE SULFATE 4 MG/ML IJ SOLN
INTRAMUSCULAR | Status: AC
  Filled 2013-11-21: qty 1

## 2013-11-21 MED ORDER — ONDANSETRON HCL 4 MG/2ML IJ SOLN
4.0000 mg | Freq: Once | INTRAMUSCULAR | Status: AC
  Administered 2013-11-21: 4 mg via INTRAVENOUS
  Filled 2013-11-21: qty 2

## 2013-11-21 MED ORDER — SODIUM CHLORIDE 0.9 % IV BOLUS (SEPSIS)
1000.0000 mL | Freq: Once | INTRAVENOUS | Status: AC
  Administered 2013-11-21: 1000 mL via INTRAVENOUS

## 2013-11-21 MED ORDER — MORPHINE SULFATE 4 MG/ML IJ SOLN
4.0000 mg | Freq: Once | INTRAMUSCULAR | Status: AC
  Administered 2013-11-21: 4 mg via INTRAVENOUS

## 2013-11-21 MED ORDER — HYDROMORPHONE HCL PF 1 MG/ML IJ SOLN
1.0000 mg | Freq: Once | INTRAMUSCULAR | Status: AC
  Administered 2013-11-21: 1 mg via INTRAVENOUS
  Filled 2013-11-21: qty 1

## 2013-11-21 MED ORDER — METOCLOPRAMIDE HCL 5 MG/ML IJ SOLN
10.0000 mg | Freq: Once | INTRAMUSCULAR | Status: AC
  Administered 2013-11-22: 10 mg via INTRAVENOUS
  Filled 2013-11-21: qty 2

## 2013-11-21 MED ORDER — HYDROCODONE-ACETAMINOPHEN 5-325 MG PO TABS: 1.0000 | ORAL_TABLET | Freq: Four times a day (QID) | ORAL | Status: DC | PRN

## 2013-11-21 MED ORDER — ONDANSETRON HCL 4 MG PO TABS: 4.0000 mg | ORAL_TABLET | Freq: Four times a day (QID) | ORAL | Status: DC

## 2013-11-21 MED ORDER — IOHEXOL 300 MG/ML  SOLN
25.0000 mL | INTRAMUSCULAR | Status: AC
  Administered 2013-11-21: 25 mL via ORAL

## 2013-11-21 MED ORDER — DIPHENHYDRAMINE HCL 50 MG/ML IJ SOLN
25.0000 mg | Freq: Once | INTRAMUSCULAR | Status: AC
  Administered 2013-11-22: via INTRAVENOUS
  Filled 2013-11-21: qty 1

## 2013-11-21 NOTE — ED Provider Notes (Signed)
CSN: [id number]     Arrival date & time 11/21/13  0801 History   First MD Initiated Contact with Patient 11/21/13 [phone]     Chief Complaint  Patient presents with  . Foot Pain  . Abdominal Pain     (Consider location/radiation/quality/duration/timing/severity/associated sxs/prior Treatment) HPI Comments: Patient presents to the emergency department with chief complaint of dominant pain and vomiting. He states that he has had right upper quadrant abdominal pain x2 days. He reports associated vomiting. He states that the symptoms have kept him up all night. He rates his pain as a 10 out of 10. The pain does not radiate. He denies drinking alcohol. He denies having this pain before. He has a history of cholecystectomy. Denies any diarrhea or dysuria. Additionally, he also complains of chronic right foot pain. History of antibiotics about a week ago for the same. He states that it feels a little but better. He has an appointment to followup with Dr. Lajoyce Cornersuda on Wednesday.  The history is provided by the patient. No language interpreter was used.    Past Medical History  Diagnosis Date  . Hypertension   . Diabetes mellitus     type 2 iddm x 18 yrs  . Vascular disease     poor circulation to left foot  . Acute osteomyelitis, ankle and foot 07/30/2010    Qualifier: Diagnosis of  By: Daiva EvesVan Dam MD, Remi Haggardornelius    . Anemia   . Renal insufficiency   . Hiatal hernia   . DDD (degenerative disc disease), lumbar   . Pancreatitis    Past Surgical History  Procedure Laterality Date  . Cholecystectomy    . Toe amputation  2012    left foot; great toe and second toe  . Amputation  04/15/2011    Procedure: AMPUTATION RAY;  Surgeon: Nadara MustardMarcus V Duda, MD;  [facility];  Service: Orthopedics;  Laterality: Left;  left foot third toe amputation and MPP joint and gastroc resection VS. achilles lengthing   . Amputation  04/25/2011    Procedure: AMPUTATION DIGIT;  Surgeon: Nadara MustardMarcus V Duda, MD;  [facility];   Service: Orthopedics;  Laterality: Left;  Left foot 3rd toe amputation MTP joint, Gastroc Recession  Achilles Lengthening   . Amputation Bilateral 03/25/2013    Procedure: AMPUTATION RAY;  Surgeon: Nadara MustardMarcus V Duda, MD;  [facility];  Service: Orthopedics;  Laterality: Bilateral;  Left Great Toe Amputation at  MTP Joint, Right 1st and 2nd Ray Amputation    No family history on file. History  Substance Use Topics  . Smoking status: Current Every Day Smoker -- 0.25 packs/day for 1 years    Types: Cigarettes  . Smokeless tobacco: Never Used  . Alcohol Use: No    Review of Systems  Constitutional: Negative for fever and chills.  Respiratory: Negative for shortness of breath.   Cardiovascular: Negative for chest pain.  Gastrointestinal: Positive for nausea, vomiting and abdominal pain. Negative for diarrhea and constipation.  Genitourinary: Negative for dysuria.  Musculoskeletal: Positive for arthralgias.  All other systems reviewed and are negative.     Allergies  Review of patient's allergies indicates no known allergies.  Home Medications   Prior to Admission medications   Medication Sig Start Date End Date Taking? Authorizing Provider  cephALEXin (KEFLEX) 500 MG capsule Take 1 capsule (500 mg total) by mouth 4 (four) times daily. 11/14/13  Yes Elson AreasLeslie K Sofia, PA-C  HYDROcodone-acetaminophen (NORCO/VICODIN) 5-325 MG per tablet Take 2 tablets by mouth  every 4 (four) hours as needed for moderate pain. 11/14/13  Yes Lonia Skinner Sofia, PA-C  lisinopril (PRINIVIL,ZESTRIL) 10 MG tablet Take 10 mg by mouth daily.   Yes Historical Provider, MD  metFORMIN (GLUCOPHAGE) 500 MG tablet Take 500 mg by mouth 2 (two) times daily with a meal.   Yes Historical Provider, MD   BP 135/88  Pulse 73  Temp(Src) 97.5 F (36.4 C) (Oral)  Resp 18  Ht 6\' 1"  (1.854 m)  Wt 235 lb (106.595 kg)  BMI 31.01 kg/m2  SpO2 100% Physical Exam  Nursing note and vitals reviewed. Constitutional: He is oriented to  person, place, and time. He appears well-developed and well-nourished.  HENT:  Head: Normocephalic and atraumatic.  Eyes: Conjunctivae and EOM are normal. Pupils are equal, round, and reactive to light. Right eye exhibits no discharge. Left eye exhibits no discharge. No scleral icterus.  Neck: Normal range of motion. Neck supple. No JVD present.  Cardiovascular: Normal rate, regular rhythm and normal heart sounds.  Exam reveals no gallop and no friction rub.   No murmur heard. Intact distal pulses with brisk capillary refill  Pulmonary/Chest: Effort normal and breath sounds normal. No respiratory distress. He has no wheezes. He has no rales. He exhibits no tenderness.  Abdominal: Soft. He exhibits no distension and no mass. There is no tenderness. There is no rebound and no guarding.  Right upper quadrant tender palpation, no other focal abdominal tenderness  Musculoskeletal: Normal range of motion. He exhibits no edema and no tenderness.  Right foot nontender to palpation, no evidence of erythema, or cellulitis, great toe and second toe have been amputated  Neurological: He is alert and oriented to person, place, and time.  Skin: Skin is warm and dry.  Psychiatric: He has a normal mood and affect. His behavior is normal. Judgment and thought content normal.    ED Course  Procedures (including critical care time) Results for orders placed during the hospital encounter of 11/21/13  CBC WITH DIFFERENTIAL      Result Value Ref Range   WBC 6.9  4.0 - 10.5 K/uL   RBC 4.19 (*) 4.22 - 5.81 MIL/uL   Hemoglobin 12.5 (*) 13.0 - 17.0 g/dL   HCT 96.0 (*) 45.4 - 09.8 %   MCV 89.3  78.0 - 100.0 fL   MCH 29.8  26.0 - 34.0 pg   MCHC 33.4  30.0 - 36.0 g/dL   RDW 11.9  14.7 - 82.9 %   Platelets 220  150 - 400 K/uL   Neutrophils Relative % 58  43 - 77 %   Neutro Abs 3.9  1.7 - 7.7 K/uL   Lymphocytes Relative 33  12 - 46 %   Lymphs Abs 2.3  0.7 - 4.0 K/uL   Monocytes Relative 7  3 - 12 %   Monocytes  Absolute 0.5  0.1 - 1.0 K/uL   Eosinophils Relative 2  0 - 5 %   Eosinophils Absolute 0.2  0.0 - 0.7 K/uL   Basophils Relative 0  0 - 1 %   Basophils Absolute 0.0  0.0 - 0.1 K/uL  COMPREHENSIVE METABOLIC PANEL      Result Value Ref Range   Sodium 138  137 - 147 mEq/L   Potassium 4.4  3.7 - 5.3 mEq/L   Chloride 102  96 - 112 mEq/L   CO2 26  19 - 32 mEq/L   Glucose, Bld 146 (*) 70 - 99 mg/dL   BUN 31 (*)  6 - 23 mg/dL   Creatinine, Ser 1.61 (*) 0.50 - 1.35 mg/dL   Calcium 9.7  8.4 - 09.6 mg/dL   Total Protein 8.5 (*) 6.0 - 8.3 g/dL   Albumin 3.8  3.5 - 5.2 g/dL   AST 17  0 - 37 U/L   ALT 18  0 - 53 U/L   Alkaline Phosphatase 106  39 - 117 U/L   Total Bilirubin 0.2 (*) 0.3 - 1.2 mg/dL   GFR calc non Af Amer 46 (*) >90 mL/min   GFR calc Af Amer 53 (*) >90 mL/min  URINALYSIS, ROUTINE W REFLEX MICROSCOPIC      Result Value Ref Range   Color, Urine YELLOW  YELLOW   APPearance CLEAR  CLEAR   Specific Gravity, Urine 1.022  1.005 - 1.030   pH 5.0  5.0 - 8.0   Glucose, UA NEGATIVE  NEGATIVE mg/dL   Hgb urine dipstick TRACE (*) NEGATIVE   Bilirubin Urine NEGATIVE  NEGATIVE   Ketones, ur NEGATIVE  NEGATIVE mg/dL   Protein, ur 045 (*) NEGATIVE mg/dL   Urobilinogen, UA 0.2  0.0 - 1.0 mg/dL   Nitrite NEGATIVE  NEGATIVE   Leukocytes, UA NEGATIVE  NEGATIVE  LIPASE, BLOOD      Result Value Ref Range   Lipase 35  11 - 59 U/L  URINE MICROSCOPIC-ADD ON      Result Value Ref Range   Squamous Epithelial / LPF RARE  RARE   WBC, UA 3-6  <3 WBC/hpf   Bacteria, UA FEW (*) RARE   Casts HYALINE CASTS (*) NEGATIVE   Ct Abdomen Pelvis Wo Contrast  11/21/2013   CLINICAL DATA:  Abdominal pain and vomiting  EXAM: CT ABDOMEN AND PELVIS WITHOUT CONTRAST  TECHNIQUE: Multidetector CT imaging of the abdomen and pelvis was performed following the standard protocol without IV contrast. Oral contrast was administered.  COMPARISON:  August 30, 2013  FINDINGS: There is mild bibasilar atelectatic change. There is a  small hiatal hernia. Gastroesophageal reflux is noted.  The liver is prominent, measuring 18.6 cm in length. No focal liver lesions are identified on this noncontrast enhanced study. There is no appreciable biliary duct dilatation. Gallbladder is absent.  Spleen, pancreas, adrenals appear normal. The right kidney is malrotated and located inferiorly to the typical flank position. The right kidney is located in the right mid abdomen somewhat medial and inferior to the typical location for right kidney. Left kidney is in normal flank position. There is no renal mass, hydronephrosis, or calculus on either side. There is no ureteral calculus or ureterectasis on either side.  In the pelvis, the urinary bladder wall is borderline thickened. Urinary bladder is located in the midline. There is no appreciable pelvic mass or fluid collection. The appendix appears normal.  There is no bowel obstruction.  No free air or portal venous air.  Is no ascites, adenopathy, or abscess in the abdomen or pelvis. There is no evidence of abdominal aortic aneurysm. There is degenerative change in the lumbar spine. There are no blastic or lytic bone lesions.  IMPRESSION: Malrotated inferiorly positioned right kidney. There is no renal or ureteral calculus or hydronephrosis on either side.  Liver is prominent without focal lesion.  Gallbladder is absent.  Appendix appears normal. There is no bowel bowel obstruction or abscess.  Coronary bladder wall is borderline thickened. Question a degree of cystitis.  Arthropathy in the lumbar spine. Note that there is vacuum phenomenon with disc space narrowing at L4-5  and L5-S1.   Electronically Signed   By: Bretta Bang [name]   On: 11/21/2013 12:50   Dg Foot Complete Right  11/14/2013   CLINICAL DATA:  Dropped can on foot. Foot injury and pain. Diabetes and peripheral vascular disease.  EXAM: RIGHT FOOT COMPLETE - 3+ VIEW  COMPARISON:  11/07/2013  FINDINGS: Avulsion fracture fragment is again  seen along the dorsal aspect of the proximal interphalangeal joint of the third toe which is unchanged since previous exam. No new fracture or dislocation identified.  Previous amputation of the first and second toes is demonstrated. Periosteal thickening is again seen involving second and third metatarsals as well as the proximal phalanges of the third and fourth toes which is stable. Chronic osteomyelitis cannot be excluded.  IMPRESSION: No new findings.  Stable appearance of small avulsion fracture fragment along the dorsal aspect of the the PIP joint of the third toe.  Stable periosteal thickening involving the second and third metatarsals and proximal phalanges of the third and fourth toes; chronic osteomyelitis cannot be excluded.   Electronically Signed   By: Myles Rosenthal [name]   On: 11/14/2013 10:14   Dg Foot Complete Right  11/07/2013   CLINICAL DATA:  Dropped can on foot  EXAM: RIGHT FOOT COMPLETE - 3+ VIEW  COMPARISON:  Prior radiographs of the right foot 05/12/2013  FINDINGS: Small acute chip fracture of the distal aspect of the proximal phalanx of the third toe. Similar appearance of a prior amputation of the right 2nd toe at the metacarpal head, and the entire first ray. There has been interval healing of the osteotomy site of the 2nd metacarpal head. Interval development of abnormal findings at the distal aspect of the metacarpal and proximal phalanx of the third digit. Specifically, there is new thick periosteal reaction along the cortex of both the bones and rarefaction an irregular lucency in the head of the third metacarpal. These are interval findings compared to prior. There is also some mild periosteal reaction along the base of the proximal phalanx of the 4th toe. Associated soft tissue swelling.  IMPRESSION: 1. Acute chip fracture of the distal aspect of the proximal phalanx of the third digit. 2. Interval development of periosteal reaction along the cortex of the third metacarpal and  adjacent proximal phalanx as well as widening, rarefaction and irregular lucency in the metacarpal head. Findings raise concern for chronic osteomyelitis versus severe stress reaction and developing stress fracture/Freiberg's infarction of the metacarpal head related to altered biomechanics of the foot status post amputation. Recommend further evaluation with MRI of the foot with (if renal function permits) and without contrast to differentiate. 3. More subtle periosteal reaction along the base of the proximal phalanx of the fourth toe likely reflects the same process as above.   Electronically Signed   By: Malachy Moan [name]   On: 11/07/2013 13:24      EKG Interpretation None      MDM   Final diagnoses:  Nausea and vomiting  Foot pain    Patient with right upper quadrant abdominal pain, and reported persistent vomiting. Will treat pain, CT abdomen. Also complains of chronic right foot pain. There is no evidence of infection. Plain film from a week ago is reviewed, possible chronic osteomyelitis.  Patient has follow-up with Dr. Lajoyce Corners in 3 days.  No evidence of acute infection.  2:36 PM CT results as above. Suspect that the slight increase in creatinine secondary to vomiting and dehydration. Recommend increasing oral intake. Will  prescribe Zofran. Abdomen is soft, no evidence of surgical abdomen this time. Also, regarding the right foot, did not see any evidence of infection, and will recommend discharge with orthopedic followup later this week. Patient has an appointment in 3 days. Return precautions given. Patient understands and agrees with plan. He is stable and ready for discharge.   Roxy Horsemanobert Mashelle Busick, PA-C 11/21/13 1438

## 2013-11-21 NOTE — ED Provider Notes (Signed)
CSN: [id number]     Arrival date & time 11/21/13  2206 History   First MD Initiated Contact with Patient 11/21/13 2236     Chief Complaint  Patient presents with  . Abdominal Pain     (Consider location/radiation/quality/duration/timing/severity/associated sxs/prior Treatment) HPI Comments: Patient is a 38 year old male with a past medical history of hypertension, diabetes, and cyclic vomiting who presents with abdominal pain for the past 3 days. The pain is located in his epigastrium and does not radiate. The pain is described as cramping and severe. The pain started gradually and progressively worsened since the onset. No alleviating/aggravating factors. The patient has tried nothing at home for symptoms without relief. Associated symptoms include nausea and vomiting. [facility] and had a full work up including labs and CT abdomen/pelvis. The work up was unremarkable and patient left before getting his prescriptions for Vicodin and zofran. Patient denies fever, headache, diarrhea, chest pain, SOB, dysuria, constipation/    Past Medical History  Diagnosis Date  . Hypertension   . Diabetes mellitus     type 2 iddm x 18 yrs  . Vascular disease     poor circulation to left foot  . Acute osteomyelitis, ankle and foot 07/30/2010    Qualifier: Diagnosis of  By: Daiva EvesVan Dam MD, Remi Haggardornelius    . Anemia   . Renal insufficiency   . Hiatal hernia   . DDD (degenerative disc disease), lumbar   . Pancreatitis   . Gastroparesis    Past Surgical History  Procedure Laterality Date  . Cholecystectomy    . Toe amputation  2012    left foot; great toe and second toe  . Amputation  04/15/2011    Procedure: AMPUTATION RAY;  Surgeon: Nadara MustardMarcus V Duda, MD;  [facility];  Service: Orthopedics;  Laterality: Left;  left foot third toe amputation and MPP joint and gastroc resection VS. achilles lengthing   . Amputation  04/25/2011    Procedure: AMPUTATION DIGIT;   Surgeon: Nadara MustardMarcus V Duda, MD;  [facility];  Service: Orthopedics;  Laterality: Left;  Left foot 3rd toe amputation MTP joint, Gastroc Recession  Achilles Lengthening   . Amputation Bilateral 03/25/2013    Procedure: AMPUTATION RAY;  Surgeon: Nadara MustardMarcus V Duda, MD;  [facility];  Service: Orthopedics;  Laterality: Bilateral;  Left Great Toe Amputation at  [facility] Joint, Right 1st and 2nd Ray Amputation    History reviewed. No pertinent family history. History  Substance Use Topics  . Smoking status: Current Every Day Smoker -- 0.25 packs/day for 1 years    Types: Cigarettes  . Smokeless tobacco: Never Used  . Alcohol Use: No    Review of Systems  Constitutional: Negative for fever, chills and fatigue.  HENT: Negative for trouble swallowing.   Eyes: Negative for visual disturbance.  Respiratory: Negative for shortness of breath.   Cardiovascular: Negative for chest pain and palpitations.  Gastrointestinal: Positive for nausea, vomiting and abdominal pain. Negative for diarrhea.  Genitourinary: Negative for dysuria and difficulty urinating.  Musculoskeletal: Negative for arthralgias and neck pain.  Skin: Negative for color change.  Neurological: Negative for dizziness and weakness.  Psychiatric/Behavioral: Negative for dysphoric mood.      Allergies  Review of patient's allergies indicates no known allergies.  Home Medications   Prior to Admission medications   Medication Sig Start Date End Date Taking? Authorizing Provider  cephALEXin (KEFLEX) 500 MG capsule Take 1 capsule (  500 mg total) by mouth 4 (four) times daily. 11/14/13   Elson AreasLeslie K Sofia, PA-C  HYDROcodone-acetaminophen (NORCO/VICODIN) 5-325 MG per tablet Take 2 tablets by mouth every 4 (four) hours as needed for moderate pain. 11/14/13   Elson AreasLeslie K Sofia, PA-C  HYDROcodone-acetaminophen (NORCO/VICODIN) 5-325 MG per tablet Take 1-2 tablets by mouth every 6 (six) hours as needed for moderate pain or severe pain. 11/21/13   Roxy Horsemanobert  Browning, PA-C  lisinopril (PRINIVIL,ZESTRIL) 10 MG tablet Take 10 mg by mouth daily.    Historical Provider, MD  metFORMIN (GLUCOPHAGE) 500 MG tablet Take 500 mg by mouth 2 (two) times daily with a meal.    Historical Provider, MD  ondansetron (ZOFRAN) 4 MG tablet Take 1 tablet (4 mg total) by mouth every 6 (six) hours. 11/21/13   Roxy Horsemanobert Browning, PA-C   BP 154/102  Pulse 82  Temp(Src) 98.1 F (36.7 C)  Resp 18  Wt 235 lb (106.595 kg)  SpO2 100% Physical Exam  Nursing note and vitals reviewed. Constitutional: He is oriented to person, place, and time. He appears well-developed and well-nourished. No distress.  HENT:  Head: Normocephalic and atraumatic.  Eyes: Conjunctivae and EOM are normal. No scleral icterus.  Neck: Normal range of motion.  Cardiovascular: Normal rate and regular rhythm.  Exam reveals no gallop and no friction rub.   No murmur heard. Pulmonary/Chest: Effort normal and breath sounds normal. He has no wheezes. He has no rales. He exhibits no tenderness.  Abdominal: Soft. He exhibits no distension. There is tenderness. There is no rebound and no guarding.  Epigastric tenderness to palpation. No other focal tenderness or peritoneal signs.   Musculoskeletal: Normal range of motion.  Neurological: He is alert and oriented to person, place, and time. Coordination normal.  Speech is goal-oriented. Moves limbs without ataxia.   Skin: Skin is warm and dry.  Psychiatric: He has a normal mood and affect. His behavior is normal.    ED Course  Procedures (including critical care time) Labs Review Labs Reviewed  CBC WITH DIFFERENTIAL - Abnormal; Notable for the following:    Neutrophils Relative % 82 (*)    Lymphocytes Relative 11 (*)    All other components within normal limits  BASIC METABOLIC PANEL - Abnormal; Notable for the following:    Glucose, Bld 182 (*)    BUN 28 (*)    Creatinine, Ser 1.80 (*)    GFR calc non Af Amer 46 (*)    GFR calc Af Amer 54 (*)    All  other components within normal limits  HEPATIC FUNCTION PANEL - Abnormal; Notable for the following:    Total Protein 8.8 (*)    All other components within normal limits  LIPASE, BLOOD    Imaging Review Ct Abdomen Pelvis Wo Contrast  11/21/2013   CLINICAL DATA:  Abdominal pain and vomiting  EXAM: CT ABDOMEN AND PELVIS WITHOUT CONTRAST  TECHNIQUE: Multidetector CT imaging of the abdomen and pelvis was performed following the standard protocol without IV contrast. Oral contrast was administered.  COMPARISON:  August 30, 2013  FINDINGS: There is mild bibasilar atelectatic change. There is a small hiatal hernia. Gastroesophageal reflux is noted.  The liver is prominent, measuring 18.6 cm in length. No focal liver lesions are identified on this noncontrast enhanced study. There is no appreciable biliary duct dilatation. Gallbladder is absent.  Spleen, pancreas, adrenals appear normal. The right kidney is malrotated and located inferiorly to the typical flank position. The right kidney is located  in the right mid abdomen somewhat medial and inferior to the typical location for right kidney. Left kidney is in normal flank position. There is no renal mass, hydronephrosis, or calculus on either side. There is no ureteral calculus or ureterectasis on either side.  In the pelvis, the urinary bladder wall is borderline thickened. Urinary bladder is located in the midline. There is no appreciable pelvic mass or fluid collection. The appendix appears normal.  There is no bowel obstruction.  No free air or portal venous air.  Is no ascites, adenopathy, or abscess in the abdomen or pelvis. There is no evidence of abdominal aortic aneurysm. There is degenerative change in the lumbar spine. There are no blastic or lytic bone lesions.  IMPRESSION: Malrotated inferiorly positioned right kidney. There is no renal or ureteral calculus or hydronephrosis on either side.  Liver is prominent without focal lesion.  Gallbladder is  absent.  Appendix appears normal. There is no bowel bowel obstruction or abscess.  Coronary bladder wall is borderline thickened. Question a degree of cystitis.  Arthropathy in the lumbar spine. Note that there is vacuum phenomenon with disc space narrowing at L4-5 and L5-S1.   Electronically Signed   By: Bretta Bang [name]   On: 11/21/2013 12:50     EKG Interpretation None      MDM   Final diagnoses:  Abdominal pain    11:17 PM Labs pending. Patient will have fluids, zofran, and reglan. Vitals stable and patient afebrile.    1:00 AM Labs unremarkable for acute changes. Patient feeling better and will be discharged without further evaluation. Vitals stable and patient afebrile.   Emilia Beck, PA-C 11/22/13 [phone]

## 2013-11-21 NOTE — ED Provider Notes (Signed)
  Medical screening examination/treatment/procedure(s) were performed by non-physician practitioner and as supervising physician I was immediately available for consultation/collaboration.   EKG Interpretation None         Abigail Marsiglia, MD 11/21/13 1621 

## 2013-11-21 NOTE — ED Notes (Signed)
Pt reports that he has a right foot infection ongoing x 1 week. Pt also c/o abdominal pain with n/v x 2 days.

## 2013-11-21 NOTE — ED Notes (Signed)
CT called.  Contrast complete.

## 2013-11-21 NOTE — Discharge Instructions (Signed)
You need to follow-up with Dr. Lajoyce Cornersuda this week.  Return if your symptoms worsen.  Abdominal Pain, Adult Many things can cause abdominal pain. Usually, abdominal pain is not caused by a disease and will improve without treatment. It can often be observed and treated at home. Your health care provider will do a physical exam and possibly order blood tests and X-rays to help determine the seriousness of your pain. However, in many cases, more time must pass before a clear cause of the pain can be found. Before that point, your health care provider may not know if you need more testing or further treatment. HOME CARE INSTRUCTIONS  Monitor your abdominal pain for any changes. The following actions may help to alleviate any discomfort you are experiencing:  Only take over-the-counter or prescription medicines as directed by your health care provider.  Do not take laxatives unless directed to do so by your health care provider.  Try a clear liquid diet (broth, tea, or water) as directed by your health care provider. Slowly move to a bland diet as tolerated. SEEK MEDICAL CARE IF:  You have unexplained abdominal pain.  You have abdominal pain associated with nausea or diarrhea.  You have pain when you urinate or have a bowel movement.  You experience abdominal pain that wakes you in the night.  You have abdominal pain that is worsened or improved by eating food.  You have abdominal pain that is worsened with eating fatty foods. SEEK IMMEDIATE MEDICAL CARE IF:   Your pain does not go away within 2 hours.  You have a fever.  You keep throwing up (vomiting).  Your pain is felt only in portions of the abdomen, such as the right side or the left lower portion of the abdomen.  You pass bloody or black tarry stools. MAKE SURE YOU:  Understand these instructions.   Will watch your condition.   Will get help right away if you are not doing well or get worse.  Document Released: 03/05/2005  Document Revised: 03/16/2013 Document Reviewed: 02/02/2013 [facility] Patient Information 2014 [employer], [place].

## 2013-11-21 NOTE — ED Notes (Signed)
Pt aware that urine sample is needed. Pt given urinal

## 2013-11-21 NOTE — ED Notes (Signed)
Pt c/o abd pain and vomiting x 3 days, [facility]

## 2013-11-22 LAB — URINE CULTURE
Colony Count: NO GROWTH
Culture: NO GROWTH

## 2013-11-22 MED ORDER — ONDANSETRON 4 MG PO TBDP: 4.0000 mg | ORAL_TABLET | Freq: Three times a day (TID) | ORAL | Status: DC | PRN

## 2013-11-22 MED ORDER — HYDROCODONE-ACETAMINOPHEN 5-325 MG PO TABS: 2.0000 | ORAL_TABLET | ORAL | Status: DC | PRN

## 2013-11-22 NOTE — ED Provider Notes (Signed)
Medical screening examination/treatment/procedure(s) were performed by non-physician practitioner and as supervising physician I was immediately available for consultation/collaboration.   EKG Interpretation None       Derwood KaplanAnkit Lucien Budney, MD 11/22/13 [phone]

## 2013-11-22 NOTE — Discharge Instructions (Signed)
Take Zofran for nausea. Take vicodin for pain. Follow up with your doctor. Refer to attached documents for more information.

## 2014-03-16 ENCOUNTER — Encounter (HOSPITAL_COMMUNITY): Payer: Self-pay | Admitting: Emergency Medicine

## 2014-03-16 ENCOUNTER — Emergency Department (INDEPENDENT_AMBULATORY_CARE_PROVIDER_SITE_OTHER)
Admission: EM | Admit: 2014-03-16 | Discharge: 2014-03-16 | Disposition: A | Discharge status: Home / Self Care | Payer: Self-pay | Source: Home / Self Care | Attending: Emergency Medicine | Admitting: Emergency Medicine

## 2014-03-16 DIAGNOSIS — R69 Illness, unspecified: Principal | ICD-10-CM

## 2014-03-16 DIAGNOSIS — J111 Influenza due to unidentified influenza virus with other respiratory manifestations: Secondary | ICD-10-CM

## 2014-03-16 LAB — POCT URINALYSIS DIP (DEVICE)
Glucose, UA: NEGATIVE mg/dL
Hgb urine dipstick: NEGATIVE
Ketones, ur: NEGATIVE mg/dL
Leukocytes, UA: NEGATIVE
Nitrite: NEGATIVE
Protein, ur: >=300 mg/dL — AB
Specific Gravity, Urine: >=1.03 (ref 1.005–1.030)
Urobilinogen, UA: 0.2 mg/dL (ref 0.0–1.0)
pH: 5.5 (ref 5.0–8.0)

## 2014-03-16 LAB — POCT I-STAT, CHEM 8
BUN: 22 mg/dL (ref 6–23)
CALCIUM ION: 1.24 mmol/L — AB (ref 1.12–1.23)
CHLORIDE: 101 meq/L (ref 96–112)
CREATININE: 2.2 mg/dL — AB (ref 0.50–1.35)
GLUCOSE: 107 mg/dL — AB (ref 70–99)
HCT: 42 % (ref 39.0–52.0)
Hemoglobin: 14.3 g/dL (ref 13.0–17.0)
POTASSIUM: 4.3 meq/L (ref 3.7–5.3)
Sodium: 140 mEq/L (ref 137–147)
TCO2: 26 mmol/L (ref 0–100)

## 2014-03-16 LAB — POCT RAPID STREP A: Streptococcus, Group A Screen (Direct): NEGATIVE

## 2014-03-16 MED ORDER — ACETAMINOPHEN 325 MG PO TABS
ORAL_TABLET | ORAL | Status: AC
  Filled 2014-03-16: qty 3

## 2014-03-16 MED ORDER — ONDANSETRON HCL 4 MG PO TABS: 4.0000 mg | ORAL_TABLET | Freq: Three times a day (TID) | ORAL | Status: DC | PRN

## 2014-03-16 MED ORDER — ONDANSETRON HCL 4 MG/2ML IJ SOLN
4.0000 mg | Freq: Once | INTRAMUSCULAR | Status: AC
  Administered 2014-03-16: 4 mg via INTRAMUSCULAR

## 2014-03-16 MED ORDER — IBUPROFEN 800 MG PO TABS
ORAL_TABLET | ORAL | Status: AC
  Filled 2014-03-16: qty 1

## 2014-03-16 MED ORDER — IBUPROFEN 800 MG PO TABS
800.0000 mg | ORAL_TABLET | Freq: Once | ORAL | Status: AC
  Administered 2014-03-16: 800 mg via ORAL

## 2014-03-16 MED ORDER — ACETAMINOPHEN 500 MG PO TABS
1000.0000 mg | ORAL_TABLET | Freq: Once | ORAL | Status: AC
  Administered 2014-03-16: 1000 mg via ORAL

## 2014-03-16 MED ORDER — SODIUM CHLORIDE 0.9 % IV SOLN
Freq: Once | INTRAVENOUS | Status: AC
  Administered 2014-03-16: 17:00:00 via INTRAVENOUS

## 2014-03-16 MED ORDER — ONDANSETRON HCL 4 MG/2ML IJ SOLN
INTRAMUSCULAR | Status: AC
  Filled 2014-03-16: qty 2

## 2014-03-16 NOTE — ED Provider Notes (Signed)
CSN: [id number]     Arrival date & time 03/16/14  1551 History   First MD Initiated Contact with Patient 03/16/14 1610     Chief Complaint  Patient presents with  . Generalized Body Aches   (Consider location/radiation/quality/duration/timing/severity/associated sxs/prior Treatment) HPI Comments: 3 d history of vomiting, 5x today, 3-4 times 2 d ago, chlls, bodyaches, cough, sore throat, earaches and PND. Not taking any meds for self treatment. Continues taking his Metformin and lisinopril   Past Medical History  Diagnosis Date  . Hypertension   . Diabetes mellitus     type 2 iddm x 18 yrs  . Vascular disease     poor circulation to left foot  . Acute osteomyelitis, ankle and foot 07/30/2010    Qualifier: Diagnosis of  By: Daiva EvesVan Dam MD, Remi Haggardornelius    . Anemia   . Renal insufficiency   . Hiatal hernia   . DDD (degenerative disc disease), lumbar   . Pancreatitis   . Gastroparesis    Past Surgical History  Procedure Laterality Date  . Cholecystectomy    . Toe amputation  2012    left foot; great toe and second toe  . Amputation  04/15/2011    Procedure: AMPUTATION RAY;  Surgeon: Nadara MustardMarcus V Duda, MD;  [facility];  Service: Orthopedics;  Laterality: Left;  left foot third toe amputation and MPP joint and gastroc resection VS. achilles lengthing   . Amputation  04/25/2011    Procedure: AMPUTATION DIGIT;  Surgeon: Nadara MustardMarcus V Duda, MD;  [facility];  Service: Orthopedics;  Laterality: Left;  Left foot 3rd toe amputation MTP joint, Gastroc Recession  Achilles Lengthening   . Amputation Bilateral 03/25/2013    Procedure: AMPUTATION RAY;  Surgeon: Nadara MustardMarcus V Duda, MD;  [facility];  Service: Orthopedics;  Laterality: Bilateral;  Left Great Toe Amputation at  MTP Joint, Right 1st and 2nd Ray Amputation    History reviewed. No pertinent family history. History  Substance Use Topics  . Smoking status: Current Every Day Smoker -- 0.25 packs/day for 1 years    Types: Cigarettes  .  Smokeless tobacco: Never Used  . Alcohol Use: No    Review of Systems  Constitutional: Positive for fever, activity change, appetite change and fatigue. Negative for diaphoresis.  HENT: Positive for ear pain, postnasal drip, sore throat and trouble swallowing. Negative for facial swelling and rhinorrhea.   Eyes: Negative for pain, discharge and redness.  Respiratory: Positive for cough. Negative for chest tightness and shortness of breath.   Cardiovascular: Negative.   Gastrointestinal: Positive for nausea, vomiting and diarrhea. Negative for constipation, blood in stool and abdominal distention.  Genitourinary: Negative.   Musculoskeletal: Positive for back pain. Negative for neck pain and neck stiffness.  Skin: Negative for rash.  Neurological: Negative.     Allergies  Review of patient's allergies indicates no known allergies.  Home Medications   Prior to Admission medications   Medication Sig Start Date End Date Taking? Authorizing Provider  cephALEXin (KEFLEX) 500 MG capsule Take 1 capsule (500 mg total) by mouth 4 (four) times daily. 11/14/13   Elson AreasLeslie K Sofia, PA-C  HYDROcodone-acetaminophen (NORCO/VICODIN) 5-325 MG per tablet Take 2 tablets by mouth every 4 (four) hours as needed for moderate pain. 11/14/13   Elson AreasLeslie K Sofia, PA-C  HYDROcodone-acetaminophen (NORCO/VICODIN) 5-325 MG per tablet Take 1-2 tablets by mouth every 6 (six) hours as needed for moderate pain or severe pain. 11/21/13   Roxy Horsemanobert Browning, PA-C  HYDROcodone-acetaminophen (NORCO/VICODIN)  5-325 MG per tablet Take 2 tablets by mouth every 4 (four) hours as needed for moderate pain or severe pain. 11/22/13   Kaitlyn Szekalski, PA-C  lisinopril (PRINIVIL,ZESTRIL) 10 MG tablet Take 10 mg by mouth daily.    Historical Provider, MD  metFORMIN (GLUCOPHAGE) 500 MG tablet Take 500 mg by mouth 2 (two) times daily with a meal.    Historical Provider, MD  ondansetron (ZOFRAN ODT) 4 MG disintegrating tablet Take 1 tablet (4 mg  total) by mouth every 8 (eight) hours as needed for nausea or vomiting. 11/22/13   Emilia Beck, PA-C  ondansetron (ZOFRAN) 4 MG tablet Take 1 tablet (4 mg total) by mouth every 6 (six) hours. 11/21/13   Roxy Horseman, PA-C   BP 123/84  Pulse 92  Temp(Src) 100.1 F (37.8 C) (Oral)  Resp 18  SpO2 98% Physical Exam  Nursing note and vitals reviewed. Constitutional: He is oriented to person, place, and time. He appears well-developed and well-nourished. No distress.  HENT:  Head: Normocephalic and atraumatic.  Mouth/Throat: No oropharyngeal exudate.  Bilat TM's nl OP with minor erythema  Eyes: Conjunctivae and EOM are normal.  Neck: Normal range of motion. Neck supple.  Cardiovascular: Normal rate, regular rhythm, normal heart sounds and intact distal pulses.   Pulmonary/Chest: Effort normal and breath sounds normal. No respiratory distress. He has no wheezes. He has no rales.  Abdominal: Soft. He exhibits no distension and no mass. There is no rebound and no guarding.  Mild discomfort L upper quadrant of abdomen.  Musculoskeletal: He exhibits no edema.  Lymphadenopathy:    He has no cervical adenopathy.  Neurological: He is alert and oriented to person, place, and time. He exhibits normal muscle tone.  Skin: Skin is warm and dry.  Psychiatric: He has a normal mood and affect.    ED Course  Procedures (including critical care time) Labs Review Labs Reviewed - No data to display  Imaging Review No results found. Results for orders placed during the hospital encounter of 03/16/14  POCT URINALYSIS DIP (DEVICE)      Result Value Ref Range   Glucose, UA NEGATIVE  NEGATIVE mg/dL   Bilirubin Urine SMALL (*) NEGATIVE   Ketones, ur NEGATIVE  NEGATIVE mg/dL   Specific Gravity, Urine >=1.030  1.005 - 1.030   Hgb urine dipstick NEGATIVE  NEGATIVE   pH 5.5  5.0 - 8.0   Protein, ur >=300 (*) NEGATIVE mg/dL   Urobilinogen, UA 0.2  0.0 - 1.0 mg/dL   Nitrite NEGATIVE  NEGATIVE    Leukocytes, UA NEGATIVE  NEGATIVE  POCT I-STAT, CHEM 8      Result Value Ref Range   Sodium 140  137 - 147 mEq/L   Potassium 4.3  3.7 - 5.3 mEq/L   Chloride 101  96 - 112 mEq/L   BUN 22  6 - 23 mg/dL   Creatinine, Ser 1.61 (*) 0.50 - 1.35 mg/dL   Glucose, Bld 096 (*) 70 - 99 mg/dL   Calcium, Ion 0.45 (*) 1.12 - 1.23 mmol/L   TCO2 26  0 - 100 mmol/L   Hemoglobin 14.3  13.0 - 17.0 g/dL   HCT 40.9  81.1 - 91.4 %  POCT RAPID STREP A ([facility] URG CARE ONLY)      Result Value Ref Range   Streptococcus, Group A Screen (Direct) NEGATIVE  NEGATIVE     MDM  No diagnosis found.   Care transferred to Randal Buba, MD at 1700h  Hayden Rasmussen, NP 03/16/14 [phone]

## 2014-03-16 NOTE — ED Provider Notes (Addendum)
Patient was signed out to me by Hayden Rasmussenavid Mabe.  He received 1L NS and zofran 4mg  IM.  Patient was seen after receiving 1 L IV fluids and Zofran. He reports he feels about the same, continues to have achy back pain. He does state his nausea has resolved. He's been able to tolerate by mouth.  Discussed that his renal function is slightly worse currently. This is likely secondary to dehydration. Emphasized importance of fluid intake. We'll have him hold metformin and lisinopril for the next 3 days. He should followup with his primary care provider next week for repeat blood work. He likely has a flulike illness. Will treat symptomatically with Zofran, Tylenol, ibuprofen as needed. Return precautions reviewed as in after visit summary.  Charm RingsErin J Honig, MD 03/16/14 [phone]  Charm RingsErin J Honig, MD 05/19/14 [phone]

## 2014-03-16 NOTE — ED Notes (Signed)
Pt come in today because he has been very achy for the past 3 days, pt says that his entire body aches, pt has been vomiting and coughing, pt also says says that his throat is very dry as well

## 2014-03-16 NOTE — Discharge Instructions (Signed)
You have a flu-like illness.   This has made you dehydrated, which has affected your kidneys. Use the zofran every 8 hours as needed for nausea. Alternate Tylenol and Motrin every 4 hours to help with the body aches. Make sure you drink plenty of fluids - water, clear sodas, broth.  STOP your metformin and lisinopril for the next 3 days.  Follow up with your regular doctor next week to recheck your blood work. If the vomiting is persistent, you are getting dizzy, or you just are not getting better, please go to the emergency room.

## 2014-03-16 NOTE — ED Provider Notes (Signed)
Medical screening examination/treatment/procedure(s) were performed by non-physician practitioner and as supervising physician I was immediately available for consultation/collaboration.  Leslee Homeavid Nolyn Swab, [name]  Reuben Likesavid C Ayda Tancredi, MD 03/16/14 [phone]

## 2014-03-16 NOTE — ED Notes (Signed)
Tol. 1 cup water without vomiting.

## 2014-03-18 LAB — CULTURE, GROUP A STREP

## 2014-05-29 ENCOUNTER — Encounter (HOSPITAL_COMMUNITY): Payer: Self-pay | Admitting: *Deleted

## 2014-05-29 ENCOUNTER — Observation Stay (HOSPITAL_COMMUNITY)
Admission: EM | Admit: 2014-05-29 | Discharge: 2014-05-30 | Disposition: A | Discharge status: Home / Self Care | Payer: Medicaid Other | Attending: Internal Medicine | Admitting: Internal Medicine

## 2014-05-29 DIAGNOSIS — M79606 Pain in leg, unspecified: Secondary | ICD-10-CM

## 2014-05-29 DIAGNOSIS — R2240 Localized swelling, mass and lump, unspecified lower limb: Secondary | ICD-10-CM | POA: Diagnosis present

## 2014-05-29 DIAGNOSIS — E119 Type 2 diabetes mellitus without complications: Secondary | ICD-10-CM | POA: Insufficient documentation

## 2014-05-29 DIAGNOSIS — Z792 Long term (current) use of antibiotics: Secondary | ICD-10-CM | POA: Diagnosis not present

## 2014-05-29 DIAGNOSIS — N289 Disorder of kidney and ureter, unspecified: Secondary | ICD-10-CM | POA: Insufficient documentation

## 2014-05-29 DIAGNOSIS — I10 Essential (primary) hypertension: Secondary | ICD-10-CM | POA: Insufficient documentation

## 2014-05-29 DIAGNOSIS — K3184 Gastroparesis: Secondary | ICD-10-CM | POA: Insufficient documentation

## 2014-05-29 DIAGNOSIS — M5136 Other intervertebral disc degeneration, lumbar region: Secondary | ICD-10-CM | POA: Insufficient documentation

## 2014-05-29 DIAGNOSIS — I999 Unspecified disorder of circulatory system: Secondary | ICD-10-CM | POA: Insufficient documentation

## 2014-05-29 DIAGNOSIS — R0789 Other chest pain: Secondary | ICD-10-CM

## 2014-05-29 DIAGNOSIS — R079 Chest pain, unspecified: Secondary | ICD-10-CM | POA: Diagnosis not present

## 2014-05-29 DIAGNOSIS — K859 Acute pancreatitis, unspecified: Secondary | ICD-10-CM | POA: Insufficient documentation

## 2014-05-29 DIAGNOSIS — Z79899 Other long term (current) drug therapy: Secondary | ICD-10-CM | POA: Insufficient documentation

## 2014-05-29 DIAGNOSIS — K449 Diaphragmatic hernia without obstruction or gangrene: Secondary | ICD-10-CM | POA: Diagnosis not present

## 2014-05-29 DIAGNOSIS — Z720 Tobacco use: Secondary | ICD-10-CM | POA: Insufficient documentation

## 2014-05-29 DIAGNOSIS — D649 Anemia, unspecified: Secondary | ICD-10-CM | POA: Insufficient documentation

## 2014-05-29 DIAGNOSIS — M86179 Other acute osteomyelitis, unspecified ankle and foot: Secondary | ICD-10-CM | POA: Diagnosis not present

## 2014-05-29 DIAGNOSIS — M25579 Pain in unspecified ankle and joints of unspecified foot: Secondary | ICD-10-CM

## 2014-05-29 DIAGNOSIS — R9431 Abnormal electrocardiogram [ECG] [EKG]: Secondary | ICD-10-CM | POA: Diagnosis present

## 2014-05-29 LAB — COMPREHENSIVE METABOLIC PANEL
ALBUMIN: 3.4 g/dL — AB (ref 3.5–5.2)
ALT: 25 U/L (ref 0–53)
AST: 24 U/L (ref 0–37)
Alkaline Phosphatase: 100 U/L (ref 39–117)
Anion gap: 10 (ref 5–15)
BUN: 20 mg/dL (ref 6–23)
CO2: 26 mEq/L (ref 19–32)
Calcium: 9.6 mg/dL (ref 8.4–10.5)
Chloride: 100 mEq/L (ref 96–112)
Creatinine, Ser: 1.9 mg/dL — ABNORMAL HIGH (ref 0.50–1.35)
GFR calc Af Amer: 50 mL/min — ABNORMAL LOW (ref >90)
GFR calc non Af Amer: 44 mL/min — ABNORMAL LOW (ref >90)
GLUCOSE: 118 mg/dL — AB (ref 70–99)
Potassium: 4.4 mEq/L (ref 3.7–5.3)
SODIUM: 136 meq/L — AB (ref 137–147)
TOTAL PROTEIN: 7.8 g/dL (ref 6.0–8.3)

## 2014-05-29 LAB — CBC WITH DIFFERENTIAL/PLATELET
BASOS PCT: 0 % (ref 0–1)
Basophils Absolute: 0 10*3/uL (ref 0.0–0.1)
EOS ABS: 0.2 10*3/uL (ref 0.0–0.7)
Eosinophils Relative: 3 % (ref 0–5)
HCT: 36.7 % — ABNORMAL LOW (ref 39.0–52.0)
HEMOGLOBIN: 12.3 g/dL — AB (ref 13.0–17.0)
LYMPHS ABS: 2.8 10*3/uL (ref 0.7–4.0)
Lymphocytes Relative: 37 % (ref 12–46)
MCH: 30.1 pg (ref 26.0–34.0)
MCHC: 33.5 g/dL (ref 30.0–36.0)
MCV: 89.7 fL (ref 78.0–100.0)
Monocytes Absolute: 0.5 10*3/uL (ref 0.1–1.0)
Monocytes Relative: 6 % (ref 3–12)
NEUTROS PCT: 54 % (ref 43–77)
Neutro Abs: 4.1 10*3/uL (ref 1.7–7.7)
Platelets: 182 10*3/uL (ref 150–400)
RBC: 4.09 MIL/uL — AB (ref 4.22–5.81)
RDW: 13.3 % (ref 11.5–15.5)
WBC: 7.6 10*3/uL (ref 4.0–10.5)

## 2014-05-29 LAB — I-STAT TROPONIN, ED: TROPONIN I, POC: 0.01 ng/mL (ref 0.00–0.08)

## 2014-05-29 MED ORDER — ASPIRIN 81 MG PO CHEW
324.0000 mg | CHEWABLE_TABLET | Freq: Once | ORAL | Status: AC
  Administered 2014-05-29: 324 mg via ORAL
  Filled 2014-05-29: qty 4

## 2014-05-29 NOTE — ED Provider Notes (Signed)
I saw and evaluated the patient, reviewed the resident's note and I agree with the findings and plan.   EKG Interpretation   Date/Time:  Monday May 29 2014 21:22:04 EST Ventricular Rate:  74 PR Interval:  144 QRS Duration: 89 QT Interval:  369 QTC Calculation: 409 R Axis:   19 Text Interpretation:  Sinus rhythm Borderline T wave abnormalities  Borderline ST elevation, lateral leads Baseline wander in lead(s) V5 V6  compared to prior, now has nonspecific ST and T changes Confirmed by  [facility]  MD, TREY ([id number]) on 05/29/2014 9:26:43 PM      38 yo male with hx of HTN, DM, and vascular disease presenting with bilateral foot and ankle pain.  He had some chest pain intermittently for 3 days, but none at all for past two days.  On exam, well appearing, nontoxic, not distressed, normal respiratory effort, normal perfusion, heart sounds normal with RRR, lungs CTAB, bilateral feet with several toes ampuated, 2+ DP pulses, no significant edema. Labwork unremarkable.  Pt relatively well appearing.  However, he has some EKG changes with potentially worrisome morphology.  Most recent available EKG was from about 2 years ago and was normal at that time.  Have asked cardiology to evaluate.    Admitted.  Clinical Impression: 1. Chest pain, unspecified chest pain type   2. Pain of lower extremity, unspecified laterality   3. Chest pain       Warnell Foresterrey Kathlee Barnhardt, MD 05/30/14 [phone]

## 2014-05-29 NOTE — ED Provider Notes (Signed)
CSN: [id number]     Arrival date & time 05/29/14  1837 History   First MD Initiated Contact with Patient 05/29/14 2038     Chief Complaint  Patient presents with  . Leg Swelling  . Foot Swelling     (Consider location/radiation/quality/duration/timing/severity/associated sxs/prior Treatment) Patient is a 38 y.o. male presenting with leg pain. The history is provided by the patient and the spouse.  Leg Pain Location:  Ankle and foot Time since incident:  2 weeks Injury: no   Pain details:    Quality:  Aching   Severity:  Moderate   Onset quality:  Gradual   Duration:  2 weeks   Timing:  Constant   Progression:  Worsening Chronicity:  New Worsened by:  Bearing weight Ineffective treatments:  None tried Associated symptoms: swelling   Associated symptoms: no back pain, no decreased ROM, no fever and no numbness     Past Medical History  Diagnosis Date  . Hypertension   . Diabetes mellitus     type 2 iddm x 18 yrs  . Vascular disease     poor circulation to left foot  . Acute osteomyelitis, ankle and foot 07/30/2010    Qualifier: Diagnosis of  By: Daiva Eves MD, Remi Haggard    . Anemia   . Renal insufficiency   . Hiatal hernia   . DDD (degenerative disc disease), lumbar   . Pancreatitis   . Gastroparesis    Past Surgical History  Procedure Laterality Date  . Cholecystectomy    . Toe amputation  2012    left foot; great toe and second toe  . Amputation  04/15/2011    Procedure: AMPUTATION RAY;  Surgeon: Nadara Mustard, MD;  Location: [facility] OR;  Service: Orthopedics;  Laterality: Left;  left foot third toe amputation and MPP joint and gastroc resection VS. achilles lengthing   . Amputation  04/25/2011    Procedure: AMPUTATION DIGIT;  Surgeon: Nadara Mustard, MD;  Location: [facility] OR;  Service: Orthopedics;  Laterality: Left;  Left foot 3rd toe amputation MTP joint, Gastroc Recession  Achilles Lengthening   . Amputation Bilateral 03/25/2013    Procedure: AMPUTATION RAY;  Surgeon:  Nadara Mustard, MD;  Location: [facility] OR;  Service: Orthopedics;  Laterality: Bilateral;  Left Great Toe Amputation at  MTP Joint, Right 1st and 2nd Ray Amputation    History reviewed. No pertinent family history. History  Substance Use Topics  . Smoking status: Current Every Day Smoker -- 0.25 packs/day for 1 years    Types: Cigarettes  . Smokeless tobacco: Never Used  . Alcohol Use: No    Review of Systems  Constitutional: Negative for fever, diaphoresis, activity change and appetite change.  HENT: Negative for facial swelling, sore throat, tinnitus, trouble swallowing and voice change.   Eyes: Negative for pain, redness and visual disturbance.  Respiratory: Negative for chest tightness, shortness of breath and wheezing.   Cardiovascular: Positive for chest pain (intermittiant. No exertional. No current chest pain) and leg swelling. Negative for palpitations.  Gastrointestinal: Negative for nausea, vomiting, abdominal pain, diarrhea, constipation and abdominal distention.  Endocrine: Negative.   Genitourinary: Negative.  Negative for dysuria, decreased urine volume, scrotal swelling and testicular pain.  Musculoskeletal: Negative for myalgias, back pain and gait problem.  Skin: Negative.  Negative for rash.  Neurological: Negative.  Negative for dizziness, tremors, weakness and headaches.  Psychiatric/Behavioral: Negative for suicidal ideas, hallucinations and self-injury. The patient is not nervous/anxious.  Allergies  Review of patient's allergies indicates no known allergies.  Home Medications   Prior to Admission medications   Medication Sig Start Date End Date Taking? Authorizing Provider  gabapentin (NEURONTIN) 100 MG capsule Take 100 mg by mouth 3 (three) times daily.   Yes Historical Provider, MD  lisinopril (PRINIVIL,ZESTRIL) 10 MG tablet Take 10 mg by mouth daily.   Yes Historical Provider, MD  metFORMIN (GLUCOPHAGE) 500 MG tablet Take 500 mg by mouth 2 (two) times  daily with a meal.   Yes Historical Provider, MD  cephALEXin (KEFLEX) 500 MG capsule Take 1 capsule (500 mg total) by mouth 4 (four) times daily. 11/14/13   Elson AreasLeslie K Sofia, PA-C  HYDROcodone-acetaminophen (NORCO/VICODIN) 5-325 MG per tablet Take 2 tablets by mouth every 4 (four) hours as needed for moderate pain. 11/14/13   Elson AreasLeslie K Sofia, PA-C  HYDROcodone-acetaminophen (NORCO/VICODIN) 5-325 MG per tablet Take 1-2 tablets by mouth every 6 (six) hours as needed for moderate pain or severe pain. 11/21/13   Roxy Horsemanobert Browning, PA-C  HYDROcodone-acetaminophen (NORCO/VICODIN) 5-325 MG per tablet Take 2 tablets by mouth every 4 (four) hours as needed for moderate pain or severe pain. 11/22/13   Kaitlyn Szekalski, PA-C  ondansetron (ZOFRAN ODT) 4 MG disintegrating tablet Take 1 tablet (4 mg total) by mouth every 8 (eight) hours as needed for nausea or vomiting. 11/22/13   Kaitlyn Szekalski, PA-C   BP 118/72 mmHg  Pulse 65  Temp(Src) 97.9 F (36.6 C)  Resp 11  SpO2 100% Physical Exam  Constitutional: He is oriented to person, place, and time. He appears well-developed and well-nourished. No distress.  HENT:  Head: Normocephalic and atraumatic.  Right Ear: External ear normal.  Left Ear: External ear normal.  Nose: Nose normal.  Mouth/Throat: Oropharynx is clear and moist.  Eyes: Conjunctivae and EOM are normal. Pupils are equal, round, and reactive to light. No scleral icterus.  Neck: Normal range of motion. Neck supple. No JVD present. No tracheal deviation present. No thyromegaly present.  Cardiovascular: Normal rate and intact distal pulses.  Exam reveals no gallop and no friction rub.   No murmur heard. Pulmonary/Chest: Effort normal and breath sounds normal. No stridor. No respiratory distress. He has no wheezes. He has no rales. He exhibits no tenderness.  Abdominal: Soft. He exhibits no distension. There is no tenderness. There is no rebound and no guarding.  Musculoskeletal: Normal range of motion.  He exhibits edema (1+ non-pitting edema of the bilateral feet.). He exhibits no tenderness.  Neurological: He is alert and oriented to person, place, and time. No cranial nerve deficit. He exhibits normal muscle tone. Coordination normal.  5/5 strength in all 4 extremities. Normal Gait.   Skin: Skin is warm and dry. No rash noted. He is not diaphoretic.  Surgical incisions from previous toe amputations and c/d/i. No induration, cellulitis, surrounding abscesses or open wounds.   Psychiatric: He has a normal mood and affect. His behavior is normal.  Nursing note and vitals reviewed.   ED Course  Procedures (including critical care time) Labs Review Labs Reviewed  CBC WITH DIFFERENTIAL - Abnormal; Notable for the following:    RBC 4.09 (*)    Hemoglobin 12.3 (*)    HCT 36.7 (*)    All other components within normal limits  COMPREHENSIVE METABOLIC PANEL - Abnormal; Notable for the following:    Sodium 136 (*)    Glucose, Bld 118 (*)    Creatinine, Ser 1.90 (*)    Albumin 3.4 (*)  Total Bilirubin <0.2 (*)    GFR calc non Af Amer 44 (*)    GFR calc Af Amer 50 (*)    All other components within normal limits  BRAIN NATRIURETIC PEPTIDE  I-STAT TROPOININ, ED    Imaging Review No results found.   EKG Interpretation   Date/Time:  Monday May 29 2014 21:22:04 EST Ventricular Rate:  74 PR Interval:  144 QRS Duration: 89 QT Interval:  369 QTC Calculation: 409 R Axis:   19 Text Interpretation:  Sinus rhythm Borderline T wave abnormalities  Borderline ST elevation, lateral leads Baseline wander in lead(s) V5 V6  compared to prior, now has nonspecific ST and T changes Confirmed by  [facility]  MD, Curtis ([id number]) on 05/29/2014 9:26:43 PM      MDM   Final diagnoses:  None    The patient is a 38 y.o. M with DM and previous toe amputations in 2012 and 2014 who presents for 2 weeks of bilateral feet pain and non-pitting edema. No fevers, open sores or infectious symptoms. Symptoms  completely equal bilaterally. Do not suspect infection or blood clot. EKG shows inverted t waves in lead III and isolated ST elevation in aVL. Troponin negative and while patient endorses intermittent chest pain over the past 3 days he denies any chest pain since arrival to the ED. Cardiology consulted and will evaluate patient in the ED.   At 01:00 I have transferred care to Dr. Patria Maneampos for follow up of Cardiology recs and final disposition of the patient.  Patient seen with attending, Dr. Loretha StaplerWofford, who oversaw clinical decision making.   Lula OlszewskiMike Bella Brummet, MD 05/30/14 [phone]  Warnell Foresterrey Wofford, MD 05/30/14 [phone]

## 2014-05-29 NOTE — ED Notes (Signed)
Pt reports being diabetic and having bilateral foot swelling and into his lower legs, has a dull pain that radiates into his legs. Pt denies any open wounds on feet. Ambulatory at triage.

## 2014-05-30 ENCOUNTER — Observation Stay (HOSPITAL_COMMUNITY): Payer: Medicaid Other

## 2014-05-30 ENCOUNTER — Encounter (HOSPITAL_COMMUNITY): Payer: Self-pay | Admitting: Internal Medicine

## 2014-05-30 DIAGNOSIS — M7989 Other specified soft tissue disorders: Secondary | ICD-10-CM | POA: Diagnosis not present

## 2014-05-30 DIAGNOSIS — R9431 Abnormal electrocardiogram [ECG] [EKG]: Secondary | ICD-10-CM | POA: Diagnosis present

## 2014-05-30 DIAGNOSIS — R079 Chest pain, unspecified: Secondary | ICD-10-CM

## 2014-05-30 DIAGNOSIS — M25579 Pain in unspecified ankle and joints of unspecified foot: Secondary | ICD-10-CM

## 2014-05-30 LAB — BRAIN NATRIURETIC PEPTIDE: B Natriuretic Peptide: 19.2 pg/mL (ref 0.0–100.0)

## 2014-05-30 LAB — HEMOGLOBIN A1C
Hgb A1c MFr Bld: 6.9 % — ABNORMAL HIGH (ref <5.7)
Mean Plasma Glucose: 151 mg/dL — ABNORMAL HIGH (ref <117)

## 2014-05-30 LAB — GLUCOSE, CAPILLARY
GLUCOSE-CAPILLARY: 102 mg/dL — AB (ref 70–99)
GLUCOSE-CAPILLARY: 139 mg/dL — AB (ref 70–99)
Glucose-Capillary: 127 mg/dL — ABNORMAL HIGH (ref 70–99)

## 2014-05-30 LAB — TROPONIN I

## 2014-05-30 MED ORDER — REGADENOSON 0.4 MG/5ML IV SOLN
INTRAVENOUS | Status: AC
  Administered 2014-05-30: 0.4 mg via INTRAVENOUS
  Filled 2014-05-30: qty 5

## 2014-05-30 MED ORDER — HYDROCODONE-ACETAMINOPHEN 5-325 MG PO TABS
2.0000 | ORAL_TABLET | ORAL | Status: DC | PRN
  Administered 2014-05-30 (×2): 2 via ORAL
  Filled 2014-05-30 (×2): qty 2

## 2014-05-30 MED ORDER — ASPIRIN EC 81 MG PO TBEC
81.0000 mg | DELAYED_RELEASE_TABLET | Freq: Every day | ORAL | Status: DC
  Administered 2014-05-30: 81 mg via ORAL
  Filled 2014-05-30: qty 1

## 2014-05-30 MED ORDER — HEPARIN SODIUM (PORCINE) 5000 UNIT/ML IJ SOLN
5000.0000 [IU] | Freq: Three times a day (TID) | INTRAMUSCULAR | Status: DC
Start: 2014-05-30 — End: 2014-05-30
  Administered 2014-05-30: 5000 [IU] via SUBCUTANEOUS

## 2014-05-30 MED ORDER — TECHNETIUM TC 99M SESTAMIBI GENERIC - CARDIOLITE
10.0000 | Freq: Once | INTRAVENOUS | Status: AC | PRN
  Administered 2014-05-30: 10 via INTRAVENOUS

## 2014-05-30 MED ORDER — GABAPENTIN 100 MG PO CAPS
200.0000 mg | ORAL_CAPSULE | Freq: Three times a day (TID) | ORAL | Status: DC
  Administered 2014-05-30: 200 mg via ORAL
  Filled 2014-05-30: qty 2

## 2014-05-30 MED ORDER — TECHNETIUM TC 99M TETROFOSMIN IV KIT
10.0000 | PACK | Freq: Once | INTRAVENOUS | Status: AC | PRN
  Administered 2014-05-30: 10 via INTRAVENOUS

## 2014-05-30 MED ORDER — INFLUENZA VAC SPLIT QUAD 0.5 ML IM SUSY: 0.5000 mL | PREFILLED_SYRINGE | INTRAMUSCULAR | Status: DC

## 2014-05-30 MED ORDER — INSULIN ASPART 100 UNIT/ML ~~LOC~~ SOLN: 0.0000 [IU] | Freq: Three times a day (TID) | SUBCUTANEOUS | Status: DC

## 2014-05-30 MED ORDER — ONDANSETRON 4 MG PO TBDP
4.0000 mg | ORAL_TABLET | Freq: Three times a day (TID) | ORAL | Status: DC | PRN
  Filled 2014-05-30: qty 1

## 2014-05-30 MED ORDER — INFLUENZA VAC SPLIT QUAD 0.5 ML IM SUSY
0.5000 mL | PREFILLED_SYRINGE | INTRAMUSCULAR | Status: AC
  Administered 2014-05-30: 0.5 mL via INTRAMUSCULAR
  Filled 2014-05-30: qty 0.5

## 2014-05-30 MED ORDER — GABAPENTIN 100 MG PO CAPS: 200.0000 mg | ORAL_CAPSULE | Freq: Three times a day (TID) | ORAL | Status: DC

## 2014-05-30 MED ORDER — REGADENOSON 0.4 MG/5ML IV SOLN
0.4000 mg | Freq: Once | INTRAVENOUS | Status: AC
  Administered 2014-05-30: 0.4 mg via INTRAVENOUS
  Filled 2014-05-30: qty 5

## 2014-05-30 MED ORDER — SODIUM CHLORIDE 0.9 % IJ SOLN: 3.0000 mL | Freq: Two times a day (BID) | INTRAMUSCULAR | Status: DC

## 2014-05-30 MED ORDER — SODIUM CHLORIDE 0.9 % IV SOLN
INTRAVENOUS | Status: DC
  Administered 2014-05-30: 05:00:00 via INTRAVENOUS

## 2014-05-30 MED ORDER — SIMVASTATIN 40 MG PO TABS: 40.0000 mg | ORAL_TABLET | Freq: Every day | ORAL | Status: DC

## 2014-05-30 MED ORDER — ACETAMINOPHEN 650 MG RE SUPP: 650.0000 mg | Freq: Four times a day (QID) | RECTAL | Status: DC | PRN

## 2014-05-30 MED ORDER — PIOGLITAZONE HCL 15 MG PO TABS
15.0000 mg | ORAL_TABLET | Freq: Every day | ORAL | Status: DC
  Administered 2014-05-30: 15 mg via ORAL
  Filled 2014-05-30: qty 1

## 2014-05-30 MED ORDER — LISINOPRIL 10 MG PO TABS
10.0000 mg | ORAL_TABLET | Freq: Every day | ORAL | Status: DC
  Administered 2014-05-30: 10 mg via ORAL
  Filled 2014-05-30: qty 1

## 2014-05-30 MED ORDER — MORPHINE SULFATE 4 MG/ML IJ SOLN
6.0000 mg | Freq: Once | INTRAMUSCULAR | Status: AC
  Administered 2014-05-30: 6 mg via INTRAVENOUS
  Filled 2014-05-30: qty 2

## 2014-05-30 MED ORDER — INSULIN ASPART 100 UNIT/ML ~~LOC~~ SOLN: 0.0000 [IU] | Freq: Every day | SUBCUTANEOUS | Status: DC

## 2014-05-30 MED ORDER — ACETAMINOPHEN 325 MG PO TABS: 650.0000 mg | ORAL_TABLET | Freq: Four times a day (QID) | ORAL | Status: DC | PRN

## 2014-05-30 NOTE — Discharge Instructions (Signed)
Follow with Primary MD Fleet ContrasAVBUERE,EDWIN A, MD in 7 days   Get CBC, CMP, 2 view Chest X ray checked  by Primary MD next visit.    Activity: As tolerated with Full fall precautions use walker/cane & assistance as needed   Disposition Home    Diet: Heart Healthy /carbohydrate modified , with feeding assistance and aspiration precautions as needed.     On your next visit with your primary care physician please Get Medicines reviewed and adjusted.   Please request your Prim.MD to go over all Hospital Tests and Procedure/Radiological results at the follow up, please get all Hospital records sent to your Prim MD by signing hospital release before you go home.   If you experience worsening of your admission symptoms, develop shortness of breath, life threatening emergency, suicidal or homicidal thoughts you must seek medical attention immediately by calling 911 or calling your MD immediately  if symptoms less severe.  You Must read complete instructions/literature along with all the possible adverse reactions/side effects for all the Medicines you take and that have been prescribed to you. Take any new Medicines after you have completely understood and accpet all the possible adverse reactions/side effects.   Do not drive, operating heavy machinery, perform activities at heights, swimming or participation in water activities or provide baby sitting services if your were admitted for syncope or siezures until you have seen by Primary MD or a Neurologist and advised to do so again.  Do not drive when taking Pain medications.    Do not take more than prescribed Pain, Sleep and Anxiety Medications  Special Instructions: If you have smoked or chewed Tobacco  in the last 2 yrs please stop smoking, stop any regular Alcohol  and or any Recreational drug use.  Wear Seat belts while driving.   Please note  You were cared for by a hospitalist during your hospital stay. If you have any questions  about your discharge medications or the care you received while you were in the hospital after you are discharged, you can call the unit and asked to speak with the hospitalist on call if the hospitalist that took care of you is not available. Once you are discharged, your primary care physician will handle any further medical issues. Please note that NO REFILLS for any discharge medications will be authorized once you are discharged, as it is imperative that you return to your primary care physician (or establish a relationship with a primary care physician if you do not have one) for your aftercare needs so that they can reassess your need for medications and monitor your lab values.

## 2014-05-30 NOTE — ED Provider Notes (Signed)
Cardiology recommends observation overnight on hospitalist service. [facility] hospitalist to admit  Curtis CoKevin M Devell Parkerson, MD 05/30/14 [phone]

## 2014-05-30 NOTE — Progress Notes (Signed)
Patient Name: [name] Date of Encounter: 05/30/2014     Active Problems:   Abnormal ECG   Chest pain   Ankle pain    SUBJECTIVE  Denies any CP or SOB. Patient seen during lexiscan stress test  CURRENT MEDS . aspirin EC  81 mg Oral Daily  . gabapentin  200 mg Oral TID  . heparin  5,000 Units Subcutaneous 3 times per day  . [START ON 05/31/2014] Influenza vac split quadrivalent PF  0.5 mL Intramuscular Tomorrow-1000  . insulin aspart  0-5 Units Subcutaneous QHS  . insulin aspart  0-9 Units Subcutaneous TID WC  . lisinopril  10 mg Oral Daily  . pioglitazone  15 mg Oral Daily  . simvastatin  40 mg Oral q1800  . sodium chloride  3 mL Intravenous Q12H    OBJECTIVE  Filed Vitals:   05/30/14 0933 05/30/14 0935 05/30/14 0937 05/30/14 0939  BP: 126/69 121/74 126/82 127/80  Pulse:      Temp:      TempSrc:      Resp:      Height:      Weight:      SpO2:        Intake/Output Summary (Last 24 hours) at 05/30/14 0946 Last data filed at 05/30/14 0714  Gross per 24 hour  Intake    150 ml  Output      0 ml  Net    150 ml   Filed Weights   05/30/14 0406  Weight: 232 lb 1.6 oz (105.28 kg)    PHYSICAL EXAM  General: Pleasant, NAD. Neuro: Alert and oriented X 3. Moves all extremities spontaneously. Psych: Normal affect. HEENT:  Normal  Neck: Supple without bruits or JVD. Lungs:  Resp regular and unlabored, CTA. Heart: RRR no s3, s4, or murmurs. Abdomen: Soft, non-tender, non-distended, BS + x 4.  Extremities: No clubbing, cyanosis or edema. DP/PT/Radials 2+ and equal bilaterally. Toe amputation  Accessory Clinical Findings  CBC  Recent Labs  05/29/14 2056  WBC 7.6  NEUTROABS 4.1  HGB 12.3*  HCT 36.7*  MCV 89.7  PLT 182   Basic Metabolic Panel  Recent Labs  05/29/14 2056  NA 136*  K 4.4  CL 100  CO2 26  GLUCOSE 118*  BUN 20  CREATININE 1.90*  CALCIUM 9.6   Liver Function Tests  Recent Labs  05/29/14 2056  AST 24  ALT 25  ALKPHOS  100  BILITOT <0.2*  PROT 7.8  ALBUMIN 3.4*   Cardiac Enzymes  Recent Labs  05/30/14 0423  TROPONINI <0.03     ECG  NSR with J point elevation in I and AVL and TWI in III   Radiology/Studies  No results found.  ASSESSMENT AND PLAN  38 year old man with history of type 2 diabetes complicated by reported peripheral vascular disease and multiple toe ulcers/osteomyelitis requiring amputations, hypertension, chronic kidney disease, gastroparesis, and hiatal hernia , with and asked to evaluate due to chest pressure and nonspecific EKG changes. The patient also has 2 weeks of worsening ankle/feet swelling and discomfort.  1. Atypical chest pain:  - abnormal EKG at baseline, serial enzyme negative  - pending lexiscan stress test today (unable to walk with toe amputation)  2. Leg swelling: unclear cause  - BNP 19, normal. TSH normal in Mar 2015. No evidence of CHF  3. Diabetes mellitus:   - will need to switch from metformin given CKD with Cr around 1.9. Manage per IM  4. Hypertension: Blood  pressure relatively well controlled.  - No changes at this time.  5. Chronic kidney disease: Creatinine near baseline. - Further management per hospitalist service.  Signed, Azalee CourseMeng, Hao PA-C Pager: [phone] Myrtis SerKatz

## 2014-05-30 NOTE — Consult Note (Signed)
Cardiology Consultation Note  Patient ID: [name], MRN: [id number], DOB/AGE: 1975/09/05 38 y.o. Admit date: 05/29/2014   Date of Consult: 05/30/2014 Primary Physician: Dorrene German, MD Primary Cardiologist: None  Chief Complaint: Leg swelling Reason for Consult: Atypical chest pain  HPI: [name] is a 38 year old man with history of type 2 diabetes mellitus complicated by multiple toe ulcers and osteomyelitis requiring amputations, hypertension, peripheral vascular disease, chronic kidney disease, gastroparesis, and hiatal hernia, whom we have been asked to evaluate due to right-sided chest pain within the last week, as well as bilateral leg pain and swelling.  The patient states that he first noticed swelling in both feet and ankles about a week ago.  This has progressed and also has been accompanied by intermittent throbbing pain.  The pain worsened today prompting the patient to come to the emergency department for further evaluation.  He notes that some of the discomfort is similar to what he experienced in the past before his toes were amputated.  He denies a history of recent prolonged immobilization, surgery, or prior DVT/PE.  Upon further questioning the patient also endorses chest pain over the last week.  He reports 3 days of intermittent right sided discomfort that he describes as a sharp or throbbing sensation.  The episodes typically lasted about 10-15 minutes, occurring about 3 times per day.  The last episode was 2 days ago.  He notes that the pain improved with lying flat and worsened when sitting upright or exerting himself.  He has never had chest pain similar to this, and also denies a history of cardiac disease.  The patient reports associated nausea without emesis, though he also has a long-standing history of gastroparesis.  His chest discomfort was also associated with palpitations.  He denies shortness of breath and orthopnea.  Past Medical History  Diagnosis Date    . Hypertension   . Diabetes mellitus     type 2 iddm x 18 yrs  . Vascular disease     poor circulation to left foot  . Acute osteomyelitis, ankle and foot 07/30/2010    Qualifier: Diagnosis of  By: Daiva Eves MD, Remi Haggard    . Anemia   . Renal insufficiency   . Hiatal hernia   . DDD (degenerative disc disease), lumbar   . Pancreatitis   . Gastroparesis       Most Recent Cardiac Studies: Bilateral lower extremity venous duplex (05/13/13): No evidence of DVT or Baker's cyst in either lower extremity.     Surgical History:  Past Surgical History  Procedure Laterality Date  . Cholecystectomy    . Toe amputation  2012    left foot; great toe and second toe  . Amputation  04/15/2011    Procedure: AMPUTATION RAY;  Surgeon: Nadara Mustard, MD;  Location: [facility] OR;  Service: Orthopedics;  Laterality: Left;  left foot third toe amputation and MPP joint and gastroc resection VS. achilles lengthing   . Amputation  04/25/2011    Procedure: AMPUTATION DIGIT;  Surgeon: Nadara Mustard, MD;  Location: [facility] OR;  Service: Orthopedics;  Laterality: Left;  Left foot 3rd toe amputation MTP joint, Gastroc Recession  Achilles Lengthening   . Amputation Bilateral 03/25/2013    Procedure: AMPUTATION RAY;  Surgeon: Nadara Mustard, MD;  Location: [facility] OR;  Service: Orthopedics;  Laterality: Bilateral;  Left Great Toe Amputation at  MTP Joint, Right 1st and 2nd Ray Amputation      Home Meds: Prior to Admission medications  Medication Sig Start Date Broox Lonigro Date Taking? Authorizing Provider  gabapentin (NEURONTIN) 100 MG capsule Take 100 mg by mouth 3 (three) times daily.   Yes Historical Provider, MD  lisinopril (PRINIVIL,ZESTRIL) 10 MG tablet Take 10 mg by mouth daily.   Yes Historical Provider, MD  metFORMIN (GLUCOPHAGE) 500 MG tablet Take 500 mg by mouth 2 (two) times daily with a meal.   Yes Historical Provider, MD  cephALEXin (KEFLEX) 500 MG capsule Take 1 capsule (500 mg total) by mouth 4 (four) times daily. 11/14/13    Elson AreasLeslie K Sofia, PA-C  HYDROcodone-acetaminophen (NORCO/VICODIN) 5-325 MG per tablet Take 2 tablets by mouth every 4 (four) hours as needed for moderate pain. 11/14/13   Elson AreasLeslie K Sofia, PA-C  HYDROcodone-acetaminophen (NORCO/VICODIN) 5-325 MG per tablet Take 1-2 tablets by mouth every 6 (six) hours as needed for moderate pain or severe pain. 11/21/13   Roxy Horsemanobert Browning, PA-C  HYDROcodone-acetaminophen (NORCO/VICODIN) 5-325 MG per tablet Take 2 tablets by mouth every 4 (four) hours as needed for moderate pain or severe pain. 11/22/13   Kaitlyn Szekalski, PA-C  ondansetron (ZOFRAN ODT) 4 MG disintegrating tablet Take 1 tablet (4 mg total) by mouth every 8 (eight) hours as needed for nausea or vomiting. 11/22/13   Emilia BeckKaitlyn Szekalski, PA-C    Inpatient Medications:  None.   Allergies: No Known Allergies  History   Social History  . Marital Status: Single    Spouse Name: N/A    Number of Children: N/A  . Years of Education: N/A   Occupational History  . Not on file.   Social History Main Topics  . Smoking status: Current Every Day Smoker -- 0.10 packs/day for 3 years    Types: Cigarettes  . Smokeless tobacco: Never Used  . Alcohol Use: No  . Drug Use: 10.00 per week    Special: Marijuana     Comment: Last used 3 weeks ago  . Sexual Activity: Yes    Birth Control/ Protection: None   Other Topics Concern  . Not on file   Social History Narrative     Family History  Problem Relation Age of Onset  . Heart attack Father 4052  . Hypertension Sister      Review of Systems: Patient reports intermittent chills and sweats, as well as a nonproductive cough.  He also has long-standing emesis for at least 3 years, which happens at least 3 times per week.  He was once treated with Reglan but did not notice much improvement.  Otherwise, a 12 system review of systems was performed and was negative except as noted in the history of present illness.  Labs: No results for input(s): CKTOTAL, CKMB,  TROPONINI in the last 72 hours. Lab Results  Component Value Date   WBC 7.6 05/29/2014   HGB 12.3* 05/29/2014   HCT 36.7* 05/29/2014   MCV 89.7 05/29/2014   PLT 182 05/29/2014    Recent Labs Lab 05/29/14 2056  NA 136*  K 4.4  CL 100  CO2 26  BUN 20  CREATININE 1.90*  CALCIUM 9.6  PROT 7.8  BILITOT <0.2*  ALKPHOS 100  ALT 25  AST 24  GLUCOSE 118*   Lab Results  Component Value Date   CHOL 152 09/17/2012   HDL 38* 09/17/2012   LDLCALC 92 09/17/2012   TRIG 111 09/17/2012   Lab Results  Component Value Date   DDIMER 0.92* 12/22/2010    Radiology/Studies:  No results found.  EKGNormal sinus rhythm with J-point elevation  in V2 and nonspecific ST changes otherwise.  There is also T-wave inversion in lead III, which is new from the prior tracing on 07/07/12.  Physical Exam: Blood pressure 122/73, pulse 65, temperature 97.9 F (36.6 C), resp. rate 13, SpO2 98 %. General: Well developed, well nourished, in no acute distress. Head: Normocephalic, atraumatic, sclera non-icteric, no xanthomas, nares are without discharge.  Neck: Negative for carotid bruits. JVD not elevated. Lungs: Clear bilaterally to auscultation without wheezes, rales, or rhonchi. Breathing is unlabored. Heart: RRR with S1 S2. No murmurs, rubs, or gallops appreciated. Abdomen: Soft, non-tender, non-distended with normoactive bowel sounds. No hepatomegaly. No rebound/guarding. No obvious abdominal masses. Msk:  Strength and tone appear normal for age. Extremities: No clubbing or cyanositrace bilateral ankle edema, which appears to be more associated with the ankle joints as opposed to soft tissue swelling.  Multiple well-healed toe amputation scars are present.    Posterior tibial pulses are not palpated well bilaterally.  Dorsalis pedis pulses are 2+ bilaterally. Neuro: Alert and oriented X 3. No facial asymmetry. No focal deficit. Moves all extremities spontaneously. Psych:  Responds to questions  appropriately with a normal affect.    Assessment and Plan: 38 year old man with history of type 2 diabetes complicated by reported peripheral vascular disease and multiple toe ulcers/osteomyelitis requiring amputations, hypertension, chronic kidney disease, gastroparesis, and hiatal hernia , with and asked to evaluate due to chest pressure and nonspecific EKG changes.  The patient also has 2 weeks of worsening ankle/feet swelling and discomfort.  Chest pain: The quality of the patient's chest pain is atypical , and it has also been absent for at least 2 days.  His EKG shows subtle ST segment changes that are slightly more pronounced compared with the prior tracing, though these findings are nonspecific, particularly in the setting of no ongoing chest pain.  The patient has numerous risk factors for cardiovascular disease despite his young age, including long-standing diabetes, peripheral vascular disease, hypertension, tobacco abuse, and a positive family history of premature CAD.  Therefore, further monitoring and evaluation is warranted.   - Obtain repeat EKG to ensure that there have been no dynamic changes. - Trend troponins 3 or until they have peaked, then stop. - Aspirin 324 mg given in ED.  Consider continuing ASA 81 mg daily indefinitely. - Check fasting lipid panel and hemoglobin A1c. - Given patient's comorbidities, recommend initiating statin therapy, such as atorvastatin 80 mg daily. - Noninvasive ischemia evaluation should be considered.  Inpatient versus outpatient stress testing will be determined by his serial cardiac biomarkers and symptoms.    Leg swelling: the nature of the patient's swelling as well as his discomfort is not classic for edema.  Other etiologies including arthritis, neuropathy, and peripheral vascular disease should be entertained.  The patient does not have evidence of pulmonary edema or elevated JVD. - No indication for diuresis at this time. - Consider  checking proBNP to serve as baseline, though it may be slightly elevated given his chronic kidney disease.  If significantly elevated, consider transthoracic echocardiogram. - Further workup of noncardiac etiologies of the patient's leg swelling/pain.  Diabetes mellitus: Patient reports that his hemoglobin A1c has been quite well controlled (less than 5).  He is currently on metformin alone despite chronic kidney disease with a creatinine greater than 1.5. - Consider switching metformin to one alternative agent, given the patient's chronic kidney disease. - Check hemoglobin A1c for risk stratification of coronary disease and to evaluate degree of glycemic control.  Hypertension: Blood pressure relatively well controlled. - No changes at this time.  Chronic kidney disease: Creatinine near baseline. - Further management per hospitalist service.  Signed, Josemanuel Eakins A.  MD  05/30/2014, 2:11 AM

## 2014-05-30 NOTE — Discharge Summary (Signed)
[name], 38 y.o., DOB 06/08/1976, MRN [id number]. Admission date: 05/29/2014 Discharge Date 05/30/2014 Primary MD Curtis GermanAVBUERE,EDWIN A, MD Admitting Physician Curtis GrippeJames Kim, MD  Admission Diagnosis  Pain of lower extremity, unspecified laterality [M79.606] Chest pain, unspecified chest pain type [R07.9]  Discharge Diagnosis   Active Problems:   Abnormal ECG   Chest pain   Ankle pain   Pain in the chest      Past Medical History  Diagnosis Date  . Hypertension   . Diabetes mellitus     type 2 iddm x 18 yrs  . Vascular disease     poor circulation to left foot  . Acute osteomyelitis, ankle and foot 07/30/2010    Qualifier: Diagnosis of  By: Daiva EvesVan Dam MD, Remi Haggardornelius    . Anemia   . Renal insufficiency   . Hiatal hernia   . DDD (degenerative disc disease), lumbar   . Pancreatitis   . Gastroparesis     Past Surgical History  Procedure Laterality Date  . Cholecystectomy    . Toe amputation  2012    left foot; great toe and second toe  . Amputation  04/15/2011    Procedure: AMPUTATION RAY;  Surgeon: Nadara MustardMarcus V Duda, MD;  [facility];  Service: Orthopedics;  Laterality: Left;  left foot third toe amputation and MPP joint and gastroc resection VS. achilles lengthing   . Amputation  04/25/2011    Procedure: AMPUTATION DIGIT;  Surgeon: Nadara MustardMarcus V Duda, MD;  [facility];  Service: Orthopedics;  Laterality: Left;  Left foot 3rd toe amputation MTP joint, Gastroc Recession  Achilles Lengthening   . Amputation Bilateral 03/25/2013    Procedure: AMPUTATION RAY;  Surgeon: Nadara MustardMarcus V Duda, MD;  [facility];  Service: Orthopedics;  Laterality: Bilateral;  Left Great Toe Amputation at  MTP Joint, Right 1st and 2nd Ray Amputation      Hospital Course See H&P, Labs, Consult and Test reports for all details in brief, patient was admitted for **  Active Problems:   Abnormal ECG   Chest pain   Ankle pain   Pain in the chest  Admission history of present illness/brief narrative:  Mr.  Lynnea Clark is Clark 38 year old man with history of type 2 diabetes mellitus complicated by multiple toe ulcers and osteomyelitis requiring amputations, hypertension, peripheral vascular disease, chronic kidney disease, gastroparesis, and hiatal hernia, presents due to right-sided chest pain within the last week, as well as bilateral leg pain . EKG shows subtotal EKG changes, and giving the patient and numerous risk factors, he was admitted for further evaluation, patient had negative troponins 2, given aspirin in ED, had Clark nuclear stress test done which did not show any reversible ischemia or infarction, overall low risk stress test. -Patient has been complaining of ankle/foot pain, x-ray of ankle and foot did not show any acute findings, patient's symptoms go more with diabetic neuropathic pain, so his gabapentin dose was increased on discharge.  Consults  Cardiology  Significant Tests:  See full reports for all details    Dg Chest 2 View  05/30/2014   CLINICAL DATA:  38 year old with chest pain  EXAM: CHEST  2 VIEW  COMPARISON:  None.  FINDINGS: The cardiac silhouette and mediastinal contours are within normal limits.  There is no focal airspace consolidation, pleural effusion or pneumothorax. There is no overt pulmonary edema.  There is no acute osseous abnormality.  IMPRESSION: No active cardiopulmonary disease.   Electronically Signed   By: Fannie KneeKenneth  Crosby  On: 05/30/2014 11:35   Dg Ankle 2 Views Left  05/30/2014   CLINICAL DATA:  38 year old with ankle pain  EXAM: LEFT ANKLE - 2 VIEW  COMPARISON:  None.  FINDINGS: There is no evidence of an acute fracture or dislocation. Degenerative changes are present at the tibiotalar joint. The ankle mortise is intact. Clark small posterior calcaneal enthesophyte is present. The bone mineralization is normal. There is no significant soft tissue swelling.  IMPRESSION: 1. No acute fracture or dislocation. 2. Mild degenerative changes involving the tibiotalar joint. 3.  Small posterior calcaneal enthesophyte.   Electronically Signed   By: Fannie KneeKenneth  Crosby   On: 05/30/2014 11:40   Dg Ankle 2 Views Right  05/30/2014   CLINICAL DATA:  38 year old with ankle pain, no injury  EXAM: RIGHT ANKLE - 2 VIEW  COMPARISON:  None.  FINDINGS: There is no evidence of an acute fracture or dislocation. Clark prominent posterior calcaneal enthesophyte is present. The bone mineralization is normal. No significant soft tissue swelling is identified. The ankle mortise is intact.  IMPRESSION: 1. No acute fracture, dislocation or soft tissue swelling. 2. Posterior calcaneal enthesophyte.   Electronically Signed   By: Fannie KneeKenneth  Crosby   On: 05/30/2014 11:37   Nm Myocar Multi W/spect W/wall Motion / Ef  05/30/2014   CLINICAL DATA:  Acute chest pain with hypertension and diabetes.  EXAM: MYOCARDIAL IMAGING WITH SPECT (REST AND PHARMACOLOGIC-STRESS)  GATED LEFT VENTRICULAR WALL MOTION STUDY  LEFT VENTRICULAR EJECTION FRACTION  TECHNIQUE: Standard myocardial SPECT imaging was performed after resting intravenous injection of 10 mCi [id number] sestamibi. Subsequently, intravenous infusion of Lexiscan was performed under the supervision of the Cardiology staff. At peak effect of the drug, 30 mCi Tc-4668m sestamibi was injected intravenously and standard myocardial SPECT imaging was performed. Quantitative gated imaging was also performed to evaluate left ventricular wall motion, and estimate left ventricular ejection fraction.  COMPARISON:  None.  FINDINGS: Perfusion: Allowing for inferior attenuation artifact, No significant decreased activity in the left ventricle on stress imaging to suggest reversible ischemia or infarction.  Wall Motion: Normal left ventricular wall motion. No left ventricular dilation.  Left Ventricular Ejection Fraction: 56 %  End diastolic volume 118 ml  End systolic volume 52 ml  IMPRESSION: 1. No reversible ischemia or infarction.  2. Normal left ventricular wall motion.  3. Left  ventricular ejection fraction 56%  4. Low/Intermediate/High-risk stress test findings*.  *2012 Appropriate Use Criteria for Coronary Revascularization Focused Update: J Am Coll Cardiol. 2012;59(9):857-881. http://content.dementiazones.comonlinejacc.org/article.aspx?articleid=1201161   Electronically Signed   By: Ruel Favorsrevor  Shick [name]   On: 05/30/2014 11:16     Today   Subjective:   [name] today has no headache,no chest abdominal pain,no new weakness tingling or numbness, feels much better today.  Objective:   Blood pressure 127/80, pulse 71, temperature 97.7 F (36.5 C), temperature source Oral, resp. rate 18, height 6\' 1"  (1.854 m), weight 105.28 kg (232 lb 1.6 oz), SpO2 100 %.  Intake/Output Summary (Last 24 hours) at 05/30/14 1212 Last data filed at 05/30/14 0714  Gross per 24 hour  Intake    150 ml  Output      0 ml  Net    150 ml    Exam Awake Alert, Oriented *3, No new F.N deficits, Normal affect [place].AT,PERRAL Supple Neck,No JVD, No cervical lymphadenopathy appriciated.  Symmetrical Chest wall movement, Good air movement bilaterally, CTAB RRR,No Gallops,Rubs or new Murmurs, No Parasternal Heave +ve B.Sounds, Abd Soft, Non tender, No organomegaly appriciated,  No rebound -guarding or rigidity. No Cyanosis, Clubbing or edema, No new Rash or bruise, multiple healed toe amputations.  Data Review     CBC w Diff: Lab Results  Component Value Date   WBC 7.6 05/29/2014   HGB 12.3* 05/29/2014   HCT 36.7* 05/29/2014   PLT 182 05/29/2014   LYMPHOPCT 37 05/29/2014   MONOPCT 6 05/29/2014   EOSPCT 3 05/29/2014   BASOPCT 0 05/29/2014   CMP: Lab Results  Component Value Date   NA 136* 05/29/2014   K 4.4 05/29/2014   CL 100 05/29/2014   CO2 26 05/29/2014   BUN 20 05/29/2014   CREATININE 1.90* 05/29/2014   PROT 7.8 05/29/2014   ALBUMIN 3.4* 05/29/2014   BILITOT <0.2* 05/29/2014   ALKPHOS 100 05/29/2014   AST 24 05/29/2014   ALT 25 05/29/2014  .  Micro Results No results found  for this or any previous visit (from the past 240 hour(s)).   Discharge Instructions      Follow-up Information    Follow up with AVBUERE,EDWIN A, MD. Schedule an appointment as soon as possible for Clark visit in 1 week.   Specialty:  Internal Medicine   Contact information:   [address] [place] [address] [phone]       Discharge Medications     Medication List    STOP taking these medications        cephALEXin 500 MG capsule  Commonly known as:  KEFLEX      TAKE these medications        gabapentin 100 MG capsule  Commonly known as:  NEURONTIN  Take 2 capsules (200 mg total) by mouth 3 (three) times daily.     HYDROcodone-acetaminophen 5-325 MG per tablet  Commonly known as:  NORCO/VICODIN  Take 2 tablets by mouth every 4 (four) hours as needed for moderate pain.     lisinopril 10 MG tablet  Commonly known as:  PRINIVIL,ZESTRIL  Take 10 mg by mouth daily.     metFORMIN 500 MG tablet  Commonly known as:  GLUCOPHAGE  Take 500 mg by mouth 2 (two) times daily with Clark meal.     ondansetron 4 MG disintegrating tablet  Commonly known as:  ZOFRAN ODT  Take 1 tablet (4 mg total) by mouth every 8 (eight) hours as needed for nausea or vomiting.         Total Time in preparing paper work, data evaluation and todays exam - 35 minutes  Alliene Klugh M.D on 05/30/2014 at 12:12 PM  [facility] Office  [phone]

## 2014-05-30 NOTE — ED Notes (Signed)
Ambulated to bathroom at this time.  Steady gait noted.  Wife remains at the bedside.  Awaiting admission orders.

## 2014-05-30 NOTE — H&P (Addendum)
[name] is an 38 y.o. male.    Pcp: Dr. Ezekiel Ina  Chief Complaint: bilateral leg pain, chest pain HPI: 38 yo male with hx of dm2, hypertension, pvd, ckd stage 3 apparently c/o chest pain x3 days, right sided pain "sharp" without radiation, usually lasting about 101minutes intermittently.  slight cough (nonproductive),  +n/v, no bloody emesis.   Denies fever, chill, sob,  Heart burn, diarrhea, brbpr, black stool.  Nothing appears to make the pain better or worse.   Past Medical History  Diagnosis Date  . Hypertension   . Diabetes mellitus     type 2 iddm x 18 yrs  . Vascular disease     poor circulation to left foot  . Acute osteomyelitis, ankle and foot 07/30/2010    Qualifier: Diagnosis of  By: Tommy Medal MD, Roderic Scarce    . Anemia   . Renal insufficiency   . Hiatal hernia   . DDD (degenerative disc disease), lumbar   . Pancreatitis   . Gastroparesis     Past Surgical History  Procedure Laterality Date  . Cholecystectomy    . Toe amputation  2012    left foot; great toe and second toe  . Amputation  04/15/2011    Procedure: AMPUTATION RAY;  Surgeon: Newt Minion, MD;  Location: [place];  Service: Orthopedics;  Laterality: Left;  left foot third toe amputation and MPP joint and gastroc resection VS. achilles lengthing   . Amputation  04/25/2011    Procedure: AMPUTATION DIGIT;  Surgeon: Newt Minion, MD;  Location: [place];  Service: Orthopedics;  Laterality: Left;  Left foot 3rd toe amputation MTP joint, Gastroc Recession  Achilles Lengthening   . Amputation Bilateral 03/25/2013    Procedure: AMPUTATION RAY;  Surgeon: Newt Minion, MD;  Location: [place];  Service: Orthopedics;  Laterality: Bilateral;  Left Great Toe Amputation at  MTP Joint, Right 1st and 2nd Ray Amputation     Family History  Problem Relation Age of Onset  . Heart attack Father 31  . Hypertension Sister    Social History:  reports that he has been smoking Cigarettes.  He has a .3 pack-year smoking  history. He has never used smokeless tobacco. He reports that he uses illicit drugs (Marijuana) about 10 times per week. He reports that he does not drink alcohol.  Allergies: No Known Allergies   (Not in a hospital admission)  Results for orders placed or performed during the hospital encounter of 05/29/14 (from the past 48 hour(s))  CBC with Differential     Status: Abnormal   Collection Time: 05/29/14  8:56 PM  Result Value Ref Range   WBC 7.6 4.0 - 10.5 K/uL   RBC 4.09 (L) 4.22 - 5.81 MIL/uL   Hemoglobin 12.3 (L) 13.0 - 17.0 g/dL   HCT 36.7 (L) 39.0 - 52.0 %   MCV 89.7 78.0 - 100.0 fL   MCH 30.1 26.0 - 34.0 pg   MCHC 33.5 30.0 - 36.0 g/dL   RDW 13.3 11.5 - 15.5 %   Platelets 182 150 - 400 K/uL   Neutrophils Relative % 54 43 - 77 %   Neutro Abs 4.1 1.7 - 7.7 K/uL   Lymphocytes Relative 37 12 - 46 %   Lymphs Abs 2.8 0.7 - 4.0 K/uL   Monocytes Relative 6 3 - 12 %   Monocytes Absolute 0.5 0.1 - 1.0 K/uL   Eosinophils Relative 3 0 - 5 %   Eosinophils Absolute 0.2  0.0 - 0.7 K/uL   Basophils Relative 0 0 - 1 %   Basophils Absolute 0.0 0.0 - 0.1 K/uL  Comprehensive metabolic panel     Status: Abnormal   Collection Time: 05/29/14  8:56 PM  Result Value Ref Range   Sodium 136 (L) 137 - 147 mEq/L   Potassium 4.4 3.7 - 5.3 mEq/L   Chloride 100 96 - 112 mEq/L   CO2 26 19 - 32 mEq/L   Glucose, Bld 118 (H) 70 - 99 mg/dL   BUN 20 6 - 23 mg/dL   Creatinine, Ser 1.90 (H) 0.50 - 1.35 mg/dL   Calcium 9.6 8.4 - 10.5 mg/dL   Total Protein 7.8 6.0 - 8.3 g/dL   Albumin 3.4 (L) 3.5 - 5.2 g/dL   AST 24 0 - 37 U/L    Comment: HEMOLYSIS AT THIS LEVEL MAY AFFECT RESULT   ALT 25 0 - 53 U/L   Alkaline Phosphatase 100 39 - 117 U/L   Total Bilirubin <0.2 (L) 0.3 - 1.2 mg/dL   GFR calc non Af Amer 44 (L) >90 mL/min   GFR calc Af Amer 50 (L) >90 mL/min    Comment: (NOTE) The eGFR has been calculated using the CKD EPI equation. This calculation has not been validated in all clinical  situations. eGFR's persistently <90 mL/min signify possible Chronic Kidney Disease.    Anion gap 10 5 - 15  I-stat troponin, ED     Status: None   Collection Time: 05/29/14 10:51 PM  Result Value Ref Range   Troponin i, poc 0.01 0.00 - 0.08 ng/mL   Comment 3            Comment: Due to the release kinetics of cTnI, a negative result within the first hours of the onset of symptoms does not rule out myocardial infarction with certainty. If myocardial infarction is still suspected, repeat the test at appropriate intervals.   Brain natriuretic peptide     Status: None   Collection Time: 05/30/14 12:41 AM  Result Value Ref Range   B Natriuretic Peptide 19.2 0.0 - 100.0 pg/mL   No results found.  Review of Systems  Constitutional: Negative for fever, chills, weight loss, malaise/fatigue and diaphoresis.  HENT: Negative for congestion, ear discharge, ear pain, hearing loss, nosebleeds, sore throat and tinnitus.   Eyes: Negative for blurred vision, double vision, photophobia, pain, discharge and redness.  Respiratory: Negative for cough, hemoptysis, sputum production, shortness of breath, wheezing and stridor.   Cardiovascular: Positive for chest pain. Negative for palpitations, orthopnea, claudication, leg swelling and PND.  Gastrointestinal: Negative for heartburn, nausea, vomiting, abdominal pain, diarrhea, constipation, blood in stool and melena.  Genitourinary: Negative for dysuria, urgency, frequency, hematuria and flank pain.  Musculoskeletal: Positive for joint pain. Negative for myalgias, back pain, falls and neck pain.  Skin: Negative for itching and rash.  Neurological: Negative for dizziness, tingling, tremors, sensory change, speech change, focal weakness, seizures, loss of consciousness, weakness and headaches.  Endo/Heme/Allergies: Negative for environmental allergies and polydipsia. Does not bruise/bleed easily.  Psychiatric/Behavioral: Negative for depression, suicidal  ideas, hallucinations, memory loss and substance abuse. The patient is not nervous/anxious and does not have insomnia.     Blood pressure 130/70, pulse 71, temperature 97.8 F (36.6 C), resp. rate 16, SpO2 100 %. Physical Exam  Constitutional: He is oriented to person, place, and time. He appears well-developed and well-nourished.  HENT:  Head: Normocephalic and atraumatic.  Mouth/Throat: No oropharyngeal exudate.  Eyes: Conjunctivae  and EOM are normal. Pupils are equal, round, and reactive to light. Right eye exhibits no discharge. Left eye exhibits no discharge.  Neck: Normal range of motion. Neck supple. No JVD present. No tracheal deviation present. No thyromegaly present.  Cardiovascular: Normal rate and regular rhythm.  Exam reveals no gallop and no friction rub.   No murmur heard. Respiratory: Effort normal and breath sounds normal. No respiratory distress. He has no wheezes. He has no rales.  GI: Soft. Bowel sounds are normal. He exhibits no distension. There is no tenderness. There is no rebound and no guarding.  Musculoskeletal: Normal range of motion. He exhibits no edema or tenderness.  Lymphadenopathy:    He has no cervical adenopathy.  Neurological: He is alert and oriented to person, place, and time. He has normal reflexes. No cranial nerve deficit. Coordination normal.  Skin: Skin is warm and dry. No rash noted. No erythema. No pallor.  Psychiatric: He has a normal mood and affect. His behavior is normal. Judgment and thought content normal.     Assessment/Plan Cp Telemetry Cycle cardiac markers Check CXR.  Asprin,  Statin, no b blocker due to relative bradycardia NPO after MN  Bilateral leg pain probably neuropathy, pvd Increase gabapentin 200mg  tid  Dm2: Fsbs, ac qhs Start on actos 15mg  daily Anemia Check cbc in am  CKD stage 3 Check cmp in am  Jani Gravel 05/30/2014, 2:45 AM

## 2014-05-30 NOTE — Progress Notes (Signed)
Lexiscan stress test completed without complication, [facility]  Signed, Azalee CourseHao Shanai Lartigue PA Pager: [phone]

## 2014-06-22 ENCOUNTER — Encounter (HOSPITAL_COMMUNITY): Payer: Self-pay | Admitting: Orthopedic Surgery

## 2014-09-27 ENCOUNTER — Encounter (HOSPITAL_COMMUNITY): Payer: Self-pay

## 2014-09-27 ENCOUNTER — Emergency Department (HOSPITAL_COMMUNITY): Payer: PPO

## 2014-09-27 ENCOUNTER — Observation Stay (HOSPITAL_BASED_OUTPATIENT_CLINIC_OR_DEPARTMENT_OTHER)
Admission: EM | Admit: 2014-09-27 | Discharge: 2014-09-30 | Disposition: A | Discharge status: Home / Self Care | Payer: PPO | Source: Home / Self Care | Attending: Emergency Medicine | Admitting: Emergency Medicine

## 2014-09-27 DIAGNOSIS — E119 Type 2 diabetes mellitus without complications: Secondary | ICD-10-CM | POA: Insufficient documentation

## 2014-09-27 DIAGNOSIS — R509 Fever, unspecified: Secondary | ICD-10-CM | POA: Diagnosis not present

## 2014-09-27 DIAGNOSIS — E1122 Type 2 diabetes mellitus with diabetic chronic kidney disease: Secondary | ICD-10-CM | POA: Diagnosis present

## 2014-09-27 DIAGNOSIS — K219 Gastro-esophageal reflux disease without esophagitis: Secondary | ICD-10-CM | POA: Diagnosis present

## 2014-09-27 DIAGNOSIS — E1165 Type 2 diabetes mellitus with hyperglycemia: Secondary | ICD-10-CM | POA: Diagnosis present

## 2014-09-27 DIAGNOSIS — E1140 Type 2 diabetes mellitus with diabetic neuropathy, unspecified: Secondary | ICD-10-CM | POA: Diagnosis present

## 2014-09-27 DIAGNOSIS — Z89422 Acquired absence of other left toe(s): Secondary | ICD-10-CM

## 2014-09-27 DIAGNOSIS — R1012 Left upper quadrant pain: Secondary | ICD-10-CM | POA: Insufficient documentation

## 2014-09-27 DIAGNOSIS — R112 Nausea with vomiting, unspecified: Secondary | ICD-10-CM | POA: Diagnosis present

## 2014-09-27 DIAGNOSIS — IMO0002 Reserved for concepts with insufficient information to code with codable children: Secondary | ICD-10-CM | POA: Diagnosis present

## 2014-09-27 DIAGNOSIS — Z716 Tobacco abuse counseling: Secondary | ICD-10-CM | POA: Diagnosis present

## 2014-09-27 DIAGNOSIS — I999 Unspecified disorder of circulatory system: Secondary | ICD-10-CM | POA: Insufficient documentation

## 2014-09-27 DIAGNOSIS — E11621 Type 2 diabetes mellitus with foot ulcer: Secondary | ICD-10-CM | POA: Diagnosis not present

## 2014-09-27 DIAGNOSIS — R1032 Left lower quadrant pain: Secondary | ICD-10-CM | POA: Insufficient documentation

## 2014-09-27 DIAGNOSIS — Z79899 Other long term (current) drug therapy: Secondary | ICD-10-CM | POA: Insufficient documentation

## 2014-09-27 DIAGNOSIS — L03031 Cellulitis of right toe: Secondary | ICD-10-CM | POA: Diagnosis present

## 2014-09-27 DIAGNOSIS — N182 Chronic kidney disease, stage 2 (mild): Secondary | ICD-10-CM | POA: Diagnosis not present

## 2014-09-27 DIAGNOSIS — I129 Hypertensive chronic kidney disease with stage 1 through stage 4 chronic kidney disease, or unspecified chronic kidney disease: Secondary | ICD-10-CM | POA: Diagnosis present

## 2014-09-27 DIAGNOSIS — Z862 Personal history of diseases of the blood and blood-forming organs and certain disorders involving the immune mechanism: Secondary | ICD-10-CM | POA: Insufficient documentation

## 2014-09-27 DIAGNOSIS — Z720 Tobacco use: Secondary | ICD-10-CM | POA: Insufficient documentation

## 2014-09-27 DIAGNOSIS — F17210 Nicotine dependence, cigarettes, uncomplicated: Secondary | ICD-10-CM | POA: Diagnosis present

## 2014-09-27 DIAGNOSIS — M5136 Other intervertebral disc degeneration, lumbar region: Secondary | ICD-10-CM | POA: Insufficient documentation

## 2014-09-27 DIAGNOSIS — Z9049 Acquired absence of other specified parts of digestive tract: Secondary | ICD-10-CM | POA: Diagnosis present

## 2014-09-27 DIAGNOSIS — I10 Essential (primary) hypertension: Secondary | ICD-10-CM | POA: Diagnosis present

## 2014-09-27 DIAGNOSIS — D62 Acute posthemorrhagic anemia: Secondary | ICD-10-CM | POA: Diagnosis not present

## 2014-09-27 DIAGNOSIS — E1151 Type 2 diabetes mellitus with diabetic peripheral angiopathy without gangrene: Secondary | ICD-10-CM | POA: Diagnosis present

## 2014-09-27 DIAGNOSIS — Z794 Long term (current) use of insulin: Secondary | ICD-10-CM

## 2014-09-27 DIAGNOSIS — K5900 Constipation, unspecified: Secondary | ICD-10-CM | POA: Diagnosis present

## 2014-09-27 DIAGNOSIS — F1290 Cannabis use, unspecified, uncomplicated: Secondary | ICD-10-CM | POA: Diagnosis present

## 2014-09-27 DIAGNOSIS — R1030 Lower abdominal pain, unspecified: Secondary | ICD-10-CM | POA: Diagnosis present

## 2014-09-27 DIAGNOSIS — N183 Chronic kidney disease, stage 3 unspecified: Secondary | ICD-10-CM | POA: Diagnosis present

## 2014-09-27 DIAGNOSIS — M359 Systemic involvement of connective tissue, unspecified: Secondary | ICD-10-CM | POA: Insufficient documentation

## 2014-09-27 DIAGNOSIS — M86171 Other acute osteomyelitis, right ankle and foot: Secondary | ICD-10-CM | POA: Diagnosis present

## 2014-09-27 DIAGNOSIS — M79671 Pain in right foot: Secondary | ICD-10-CM | POA: Diagnosis not present

## 2014-09-27 DIAGNOSIS — M86179 Other acute osteomyelitis, unspecified ankle and foot: Secondary | ICD-10-CM | POA: Insufficient documentation

## 2014-09-27 DIAGNOSIS — I739 Peripheral vascular disease, unspecified: Secondary | ICD-10-CM | POA: Diagnosis present

## 2014-09-27 DIAGNOSIS — M86671 Other chronic osteomyelitis, right ankle and foot: Secondary | ICD-10-CM | POA: Diagnosis present

## 2014-09-27 DIAGNOSIS — R109 Unspecified abdominal pain: Secondary | ICD-10-CM

## 2014-09-27 DIAGNOSIS — N179 Acute kidney failure, unspecified: Secondary | ICD-10-CM | POA: Diagnosis present

## 2014-09-27 DIAGNOSIS — R0602 Shortness of breath: Secondary | ICD-10-CM

## 2014-09-27 DIAGNOSIS — E1169 Type 2 diabetes mellitus with other specified complication: Secondary | ICD-10-CM | POA: Diagnosis present

## 2014-09-27 DIAGNOSIS — K3184 Gastroparesis: Secondary | ICD-10-CM | POA: Diagnosis present

## 2014-09-27 DIAGNOSIS — L97519 Non-pressure chronic ulcer of other part of right foot with unspecified severity: Secondary | ICD-10-CM | POA: Diagnosis present

## 2014-09-27 DIAGNOSIS — Z8719 Personal history of other diseases of the digestive system: Secondary | ICD-10-CM | POA: Insufficient documentation

## 2014-09-27 DIAGNOSIS — Z87448 Personal history of other diseases of urinary system: Secondary | ICD-10-CM | POA: Insufficient documentation

## 2014-09-27 HISTORY — DX: Systemic involvement of connective tissue, unspecified: M35.9

## 2014-09-27 LAB — CBC WITH DIFFERENTIAL/PLATELET
BASOS ABS: 0 10*3/uL (ref 0.0–0.1)
BASOS PCT: 0 % (ref 0–1)
EOS ABS: 0 10*3/uL (ref 0.0–0.7)
Eosinophils Relative: 0 % (ref 0–5)
HCT: 34.3 % — ABNORMAL LOW (ref 39.0–52.0)
Hemoglobin: 12 g/dL — ABNORMAL LOW (ref 13.0–17.0)
Lymphocytes Relative: 13 % (ref 12–46)
Lymphs Abs: 1.6 10*3/uL (ref 0.7–4.0)
MCH: 30.8 pg (ref 26.0–34.0)
MCHC: 35 g/dL (ref 30.0–36.0)
MCV: 88.2 fL (ref 78.0–100.0)
Monocytes Absolute: 1 10*3/uL (ref 0.1–1.0)
Monocytes Relative: 8 % (ref 3–12)
NEUTROS ABS: 9.6 10*3/uL — AB (ref 1.7–7.7)
NEUTROS PCT: 79 % — AB (ref 43–77)
PLATELETS: 184 10*3/uL (ref 150–400)
RBC: 3.89 MIL/uL — ABNORMAL LOW (ref 4.22–5.81)
RDW: 13.1 % (ref 11.5–15.5)
WBC: 12.2 10*3/uL — ABNORMAL HIGH (ref 4.0–10.5)

## 2014-09-27 LAB — CBG MONITORING, ED: GLUCOSE-CAPILLARY: 280 mg/dL — AB (ref 70–99)

## 2014-09-27 LAB — URINALYSIS, ROUTINE W REFLEX MICROSCOPIC
BILIRUBIN URINE: NEGATIVE
Glucose, UA: >1000 mg/dL — AB
KETONES UR: NEGATIVE mg/dL
Leukocytes, UA: NEGATIVE
Nitrite: NEGATIVE
PH: 5.5 (ref 5.0–8.0)
Protein, ur: 100 mg/dL — AB
Specific Gravity, Urine: 1.023 (ref 1.005–1.030)
Urobilinogen, UA: 2 mg/dL — ABNORMAL HIGH (ref 0.0–1.0)

## 2014-09-27 LAB — COMPREHENSIVE METABOLIC PANEL
ALT: 44 U/L (ref 0–53)
AST: 23 U/L (ref 0–37)
Albumin: 3.2 g/dL — ABNORMAL LOW (ref 3.5–5.2)
Alkaline Phosphatase: 105 U/L (ref 39–117)
Anion gap: 9 (ref 5–15)
BUN: 28 mg/dL — ABNORMAL HIGH (ref 6–23)
CALCIUM: 8.6 mg/dL (ref 8.4–10.5)
CO2: 27 mmol/L (ref 19–32)
Chloride: 93 mmol/L — ABNORMAL LOW (ref 96–112)
Creatinine, Ser: 2.37 mg/dL — ABNORMAL HIGH (ref 0.50–1.35)
GFR calc Af Amer: 39 mL/min — ABNORMAL LOW (ref >90)
GFR, EST NON AFRICAN AMERICAN: 33 mL/min — AB (ref >90)
GLUCOSE: 426 mg/dL — AB (ref 70–99)
Potassium: 4.3 mmol/L (ref 3.5–5.1)
Sodium: 129 mmol/L — ABNORMAL LOW (ref 135–145)
Total Bilirubin: 0.8 mg/dL (ref 0.3–1.2)
Total Protein: 7.7 g/dL (ref 6.0–8.3)

## 2014-09-27 LAB — URINE MICROSCOPIC-ADD ON

## 2014-09-27 LAB — I-STAT CG4 LACTIC ACID, ED: Lactic Acid, Venous: 1.49 mmol/L (ref 0.5–2.0)

## 2014-09-27 LAB — LIPASE, BLOOD: LIPASE: 22 U/L (ref 11–59)

## 2014-09-27 MED ORDER — SODIUM CHLORIDE 0.9 % IV BOLUS (SEPSIS)
1000.0000 mL | Freq: Once | INTRAVENOUS | Status: AC
  Administered 2014-09-27: 1000 mL via INTRAVENOUS

## 2014-09-27 MED ORDER — ACETAMINOPHEN 325 MG PO TABS
650.0000 mg | ORAL_TABLET | Freq: Four times a day (QID) | ORAL | Status: DC | PRN
  Administered 2014-09-27: 650 mg via ORAL
  Filled 2014-09-27: qty 2

## 2014-09-27 MED ORDER — MORPHINE SULFATE 4 MG/ML IJ SOLN
4.0000 mg | Freq: Once | INTRAMUSCULAR | Status: AC
  Administered 2014-09-27: 4 mg via INTRAVENOUS
  Filled 2014-09-27: qty 1

## 2014-09-27 MED ORDER — ACETAMINOPHEN 500 MG PO TABS: 1000.0000 mg | ORAL_TABLET | Freq: Once | ORAL | Status: DC

## 2014-09-27 MED ORDER — ENOXAPARIN SODIUM 120 MG/0.8ML ~~LOC~~ SOLN
120.0000 mg | SUBCUTANEOUS | Status: AC
  Administered 2014-09-28: 120 mg via SUBCUTANEOUS
  Filled 2014-09-27 (×2): qty 0.8

## 2014-09-27 NOTE — ED Provider Notes (Signed)
CSN: [id number]     Arrival date & time 09/27/14  1424 History   First MD Initiated Contact with Patient 09/27/14 1514     Chief Complaint  Patient presents with  . Fever     (Consider location/radiation/quality/duration/timing/severity/associated sxs/prior Treatment) Patient is a 39 y.o. male presenting with general illness.  Illness Location:  Left sided abdominal pain, fever Severity:  Severe Onset quality:  Gradual Duration:  4 days Timing:  Constant Progression:  Worsening Chronicity:  New Context:  Started having fever and vomiting, then developed abdominal pain Relieved by:  Nothing Worsened by:  Nothing  Ineffective treatments:  None tried Associated symptoms: abdominal pain, cough (nonproductive), fever, nausea and vomiting   Associated symptoms: no chest pain, no diarrhea (last BM yesterday was normal) and no shortness of breath   Associated symptoms comment:  Bilateral lower extremity swelling   Past Medical History  Diagnosis Date  . Hypertension   . Diabetes mellitus     type 2 iddm x 18 yrs  . Vascular disease     poor circulation to left foot  . Acute osteomyelitis, ankle and foot 07/30/2010    Qualifier: Diagnosis of  By: Daiva Eves MD, Remi Haggard    . Anemia   . Renal insufficiency   . Hiatal hernia   . DDD (degenerative disc disease), lumbar   . Pancreatitis   . Gastroparesis    Past Surgical History  Procedure Laterality Date  . Cholecystectomy    . Toe amputation  2012    left foot; great toe and second toe  . Amputation  04/25/2011    Procedure: AMPUTATION DIGIT;  Surgeon: Nadara Mustard, MD;  Location: [facility] OR;  Service: Orthopedics;  Laterality: Left;  Left foot 3rd toe amputation MTP joint, Gastroc Recession  Achilles Lengthening   . Amputation Bilateral 03/25/2013    Procedure: AMPUTATION RAY;  Surgeon: Nadara Mustard, MD;  Location: [facility] OR;  Service: Orthopedics;  Laterality: Bilateral;  Left Great Toe Amputation at  MTP Joint, Right 1st and 2nd Ray  Amputation    Family History  Problem Relation Age of Onset  . Heart attack Father 24  . Hypertension Sister    History  Substance Use Topics  . Smoking status: Current Every Day Smoker -- 0.10 packs/day for 3 years    Types: Cigarettes  . Smokeless tobacco: Never Used  . Alcohol Use: No    Review of Systems  Constitutional: Positive for fever.  Respiratory: Positive for cough (nonproductive). Negative for shortness of breath.   Cardiovascular: Negative for chest pain.  Gastrointestinal: Positive for nausea, vomiting and abdominal pain. Negative for diarrhea (last BM yesterday was normal).  All other systems reviewed and are negative.     Allergies  Review of patient's allergies indicates no known allergies.  Home Medications   Prior to Admission medications   Medication Sig Start Date End Date Taking? Authorizing Provider  gabapentin (NEURONTIN) 100 MG capsule Take 2 capsules (200 mg total) by mouth 3 (three) times daily. 05/30/14  Yes Leana Roe Elgergawy, MD  lisinopril (PRINIVIL,ZESTRIL) 10 MG tablet Take 10 mg by mouth daily.   Yes Historical Provider, MD  metFORMIN (GLUCOPHAGE) 500 MG tablet Take 500 mg by mouth 2 (two) times daily with a meal.   Yes Historical Provider, MD  HYDROcodone-acetaminophen (NORCO/VICODIN) 5-325 MG per tablet Take 2 tablets by mouth every 4 (four) hours as needed for moderate pain. Patient not taking: Reported on 09/27/2014 11/14/13   Elson Areas,  PA-C  ondansetron (ZOFRAN ODT) 4 MG disintegrating tablet Take 1 tablet (4 mg total) by mouth every 8 (eight) hours as needed for nausea or vomiting. Patient not taking: Reported on 09/27/2014 11/22/13   Emilia Beck, PA-C   BP 129/83 mmHg  Pulse 104  Temp(Src) 103.1 F (39.5 C) (Oral)  Resp 16  SpO2 97% Physical Exam  Constitutional: He is oriented to person, place, and time. He appears well-developed and well-nourished. No distress.  HENT:  Head: Normocephalic and atraumatic.   Mouth/Throat: Oropharynx is clear and moist.  Eyes: Conjunctivae are normal. Pupils are equal, round, and reactive to light. No scleral icterus.  Neck: Neck supple.  Cardiovascular: Normal rate, regular rhythm, normal heart sounds and intact distal pulses.   No murmur heard. Pulmonary/Chest: Effort normal and breath sounds normal. No stridor. No respiratory distress. He has no wheezes. He has no rales.  Abdominal: Soft. He exhibits no distension. There is tenderness in the left upper quadrant and left lower quadrant. There is no rigidity, no rebound, no guarding and no CVA tenderness.  Musculoskeletal: Normal range of motion. He exhibits edema (2+ Edema RLE, 1+ edema LLE).  Neurological: He is alert and oriented to person, place, and time.  Skin: Skin is warm and dry. No rash noted.  Psychiatric: He has a normal mood and affect. His behavior is normal.  Nursing note and vitals reviewed.   ED Course  Procedures (including critical care time) Labs Review Labs Reviewed  CBC WITH DIFFERENTIAL/PLATELET - Abnormal; Notable for the following:    WBC 12.2 (*)    RBC 3.89 (*)    Hemoglobin 12.0 (*)    HCT 34.3 (*)    Neutrophils Relative % 79 (*)    Neutro Abs 9.6 (*)    All other components within normal limits  COMPREHENSIVE METABOLIC PANEL - Abnormal; Notable for the following:    Sodium 129 (*)    Chloride 93 (*)    Glucose, Bld 426 (*)    BUN 28 (*)    Creatinine, Ser 2.37 (*)    Albumin 3.2 (*)    GFR calc non Af Amer 33 (*)    GFR calc Af Amer 39 (*)    All other components within normal limits  CULTURE, BLOOD (ROUTINE X 2)  CULTURE, BLOOD (ROUTINE X 2)  URINE CULTURE  URINALYSIS, ROUTINE W REFLEX MICROSCOPIC  I-STAT CG4 LACTIC ACID, ED    Imaging Review Ct Abdomen Pelvis Wo Contrast  09/27/2014   CLINICAL DATA:  Four-day history of nausea and vomiting with left-sided abdominal pain  EXAM: CT ABDOMEN AND PELVIS WITHOUT CONTRAST  TECHNIQUE: Multidetector CT imaging of the  abdomen and pelvis was performed following the standard protocol without IV contrast.  COMPARISON:  11/21/2013  FINDINGS: The lung bases are free of acute infiltrate or sizable effusion.  The liver, spleen, adrenal glands and pancreas are within normal limits. The gallbladder has been surgically removed. The kidneys are well visualized bilaterally. Fullness of the left renal collecting system is seen. No definitive ureteral stone is seen. This fullness extends to the ureterovesical junction. This may be related to edema from recently passed stone or a poorly calcified stone. Right kidney is somewhat malrotated but shows no obstructive changes.  The appendix is within normal limits. The bladder is partially distended. No pelvic mass lesion or sidewall adenopathy is noted. Some right inguinal lymph nodes are seen. Some of these demonstrate a normal fatty hilus. This may be related to the bilateral  lower extremity swelling reported by patient. The largest of these lies adjacent to the right common femoral vein on image number 92 of series 2 it measures 3.7 x 1.8 cm in dimension. This was present on the prior exam but much smaller in size. This may be simply reactive in nature however.  IMPRESSION: Fullness in the left renal collecting system likely related to edema from recently passed stone or a small in a poorly calcified stone.  Lymphadenopathy particularly in the right inguinal region as described. This may simply be reactive in nature. Short-term followup is recommended to assess for resolution.   Electronically Signed   By: Alcide CleverMark  Lukens [name]   On: 09/27/2014 19:23   Dg Chest 2 View  09/27/2014   CLINICAL DATA:  Left-sided pain and fevers  EXAM: CHEST  2 VIEW  COMPARISON:  05/30/2014  FINDINGS: The heart size and mediastinal contours are within normal limits. Both lungs are clear. The visualized skeletal structures are unremarkable.  IMPRESSION: No active cardiopulmonary disease.   Electronically Signed   By:  Alcide CleverMark  Lukens [name]   On: 09/27/2014 15:25  All radiology studies independently viewed by me.      EKG Interpretation None      MDM   Final diagnoses:  Abdominal pain  Fever  39 yo male with diabetes presenting with fever and abdominal pain.  Nontoxic, but is febrile to 103.    Workup was unrevealing, including CT abd wo contrast.  Due to continued pain, unclear source of fever, plan admission for further workup and monitoring.    Blake DivineJohn Dakarri Kessinger, MD 09/28/14 [phone]

## 2014-09-27 NOTE — ED Notes (Signed)
MD at bedside. 

## 2014-09-27 NOTE — ED Notes (Signed)
Pt reports vomiting, fever, chills, left sided abd pain and bilateral lower leg edema and pain for the past 3-4 days. Pt reports he has not taken any medications at home for fever. Pt alert, oriented, nad. No vomiting at this time but pt reports constant nausea.

## 2014-09-27 NOTE — ED Notes (Signed)
Patient transported to CT 

## 2014-09-27 NOTE — ED Notes (Signed)
Patient in radiology at this time. 

## 2014-09-27 NOTE — H&P (Signed)
PCP:   Dorrene German, MD   Chief Complaint:  abd pain  HPI: 39 yo male h/o complicated dm with h/o several ble amputations, osteomyelitis, PVD, CKD baseline 1.8, pancreatitis once 3 years ago, gastroparesis comes in with 3 days of left upper abdominal pain below his ribs with associated fever, nausea and vomiting which is nonbloody.  No diarrhea.  Pt states the pain is pleuritic, when he takes a deep breath the pain goes from his luq to his back.  He has also had significant swelling in his right leg that has progressively worsened for the last 3 days and is painful also.  He has not noted any redness, but has noted pain up to the knee area.  His feet look good, there are no open wounds.  He denies any wounds or rashes anywhere on his skin.  Denies any sob.  He has been running fever for over a day.  Denies any flank pain.no recent trauma, surgery or traveling.  He was referred for admission for fever in the setting of abd pain of unclear etiology.  Review of Systems:  Positive and negative as per HPI otherwise all other systems are negative  Past Medical History: Past Medical History  Diagnosis Date  . Hypertension   . Diabetes mellitus     type 2 iddm x 18 yrs  . Vascular disease     poor circulation to left foot  . Acute osteomyelitis, ankle and foot 07/30/2010    Qualifier: Diagnosis of  By: Daiva Eves MD, Remi Haggard    . Anemia   . Renal insufficiency   . Hiatal hernia   . DDD (degenerative disc disease), lumbar   . Pancreatitis   . Gastroparesis   . Collagen vascular disease    Past Surgical History  Procedure Laterality Date  . Cholecystectomy    . Toe amputation  2012    left foot; great toe and second toe  . Amputation  04/25/2011    Procedure: AMPUTATION DIGIT;  Surgeon: Nadara Mustard, MD;  Location: [facility] OR;  Service: Orthopedics;  Laterality: Left;  Left foot 3rd toe amputation MTP joint, Gastroc Recession  Achilles Lengthening   . Amputation Bilateral 03/25/2013   Procedure: AMPUTATION RAY;  Surgeon: Nadara Mustard, MD;  Location: [facility] OR;  Service: Orthopedics;  Laterality: Bilateral;  Left Great Toe Amputation at  MTP Joint, Right 1st and 2nd Ray Amputation     Medications: Prior to Admission medications   Medication Sig Start Date End Date Taking? Authorizing Provider  gabapentin (NEURONTIN) 100 MG capsule Take 2 capsules (200 mg total) by mouth 3 (three) times daily. 05/30/14  Yes Leana Roe Elgergawy, MD  lisinopril (PRINIVIL,ZESTRIL) 10 MG tablet Take 10 mg by mouth daily.   Yes Historical Provider, MD  metFORMIN (GLUCOPHAGE) 500 MG tablet Take 500 mg by mouth 2 (two) times daily with a meal.   Yes Historical Provider, MD  HYDROcodone-acetaminophen (NORCO/VICODIN) 5-325 MG per tablet Take 2 tablets by mouth every 4 (four) hours as needed for moderate pain. Patient not taking: Reported on 09/27/2014 11/14/13   Elson Areas, PA-C  ondansetron (ZOFRAN ODT) 4 MG disintegrating tablet Take 1 tablet (4 mg total) by mouth every 8 (eight) hours as needed for nausea or vomiting. Patient not taking: Reported on 09/27/2014 11/22/13   Emilia Beck, PA-C    Allergies:  No Known Allergies  Social History:  reports that he has been smoking Cigarettes.  He has a .3 pack-year smoking history.  He has never used smokeless tobacco. He reports that he uses illicit drugs (Marijuana) about 10 times per week. He reports that he does not drink alcohol.  Family History: Family History  Problem Relation Age of Onset  . Heart attack Father 62  . Hypertension Sister     Physical Exam: Filed Vitals:   09/27/14 1852 09/27/14 1900 09/27/14 1900 09/27/14 1915  BP: 125/72 118/77  108/72  Pulse: 90 88  86  Temp:   98.9 F (37.2 C)   TempSrc:   Oral   Resp: SpO2: 97% 97%  97%   General appearance: alert, cooperative and no distress Head: Normocephalic, without obvious abnormality, atraumatic Eyes: negative Nose: Nares normal. Septum midline. Mucosa normal.  No drainage or sinus tenderness. Neck: no JVD and supple, symmetrical, trachea midline Lungs: clear to auscultation bilaterally Heart: regular rate and rhythm, S1, S2 normal, no murmur, click, rub or gallop Abdomen: soft, non-tender; bowel sounds normal; no masses,  no organomegaly Extremities: no c/c/e right leg is double the size in swelling c/w left leg.  no rash noted, positve homans sign.  no wound to ble Pulses: 2+ and symmetric Skin: Skin color, texture, turgor normal. No rashes or lesions Neurologic: Grossly normal    Labs on Admission:   Recent Labs  09/27/14 1515  NA 129*  K 4.3  CL 93*  CO2 27  GLUCOSE 426*  BUN 28*  CREATININE 2.37*  CALCIUM 8.6    Recent Labs  09/27/14 1515  AST 23  ALT 44  ALKPHOS 105  BILITOT 0.8  PROT 7.7  ALBUMIN 3.2*    Recent Labs  09/27/14 1541  LIPASE 22    Recent Labs  09/27/14 1515  WBC 12.2*  NEUTROABS 9.6*  HGB 12.0*  HCT 34.3*  MCV 88.2  PLT 184    Radiological Exams on Admission: Ct Abdomen Pelvis Wo Contrast  09/27/2014   CLINICAL DATA:  Four-day history of nausea and vomiting with left-sided abdominal pain  EXAM: CT ABDOMEN AND PELVIS WITHOUT CONTRAST  TECHNIQUE: Multidetector CT imaging of the abdomen and pelvis was performed following the standard protocol without IV contrast.  COMPARISON:  11/21/2013  FINDINGS: The lung bases are free of acute infiltrate or sizable effusion.  The liver, spleen, adrenal glands and pancreas are within normal limits. The gallbladder has been surgically removed. The kidneys are well visualized bilaterally. Fullness of the left renal collecting system is seen. No definitive ureteral stone is seen. This fullness extends to the ureterovesical junction. This may be related to edema from recently passed stone or a poorly calcified stone. Right kidney is somewhat malrotated but shows no obstructive changes.  The appendix is within normal limits. The bladder is partially distended. No  pelvic mass lesion or sidewall adenopathy is noted. Some right inguinal lymph nodes are seen. Some of these demonstrate a normal fatty hilus. This may be related to the bilateral lower extremity swelling reported by patient. The largest of these lies adjacent to the right common femoral vein on image number 92 of series 2 it measures 3.7 x 1.8 cm in dimension. This was present on the prior exam but much smaller in size. This may be simply reactive in nature however.  IMPRESSION: Fullness in the left renal collecting system likely related to edema from recently passed stone or a small in a poorly calcified stone.  Lymphadenopathy particularly in the right inguinal region as described. This may simply be reactive in nature. Short-term  followup is recommended to assess for resolution.   Electronically Signed   By: Alcide CleverMark  Lukens [name]   On: 09/27/2014 19:23   Dg Chest 2 View  09/27/2014   CLINICAL DATA:  Left-sided pain and fevers  EXAM: CHEST  2 VIEW  COMPARISON:  05/30/2014  FINDINGS: The heart size and mediastinal contours are within normal limits. Both lungs are clear. The visualized skeletal structures are unremarkable.  IMPRESSION: No active cardiopulmonary disease.   Electronically Signed   By: Alcide CleverMark  Lukens [name]   On: 09/27/2014 15:25    Assessment/Plan  39 yo male with pleuritic type pain in lower chest/luq abd/back with fever and rle swelling along with n/v  Principal Problem:   Fever-  Ck quick flu.  Suspect underlying VTE.  R/o VTE.  ua and cxr are neg along with lft/lipase/ct abd pelvis is also unrevealing.  Ck vq scan and rle u/s.  Cover with lovenox overnight full dosing.  No abx at this time.  If w/u for VTE negative, this is all likely viral.  H/o mssa bacteremia in past, blood cx have been also been ordered.  No skin source.  ??cellulitis - but i do not appreciate any red rash to rle, reassess tomorrow.  Active Problems:  Stable unless o/w noted   Nausea & vomiting-  abd exam benign.  Labs  unremarkable.  Prn zofran.   AKI (acute kidney injury)-  Ivf.  Hold ace.   DM (diabetes mellitus) type II uncontrolled, periph vascular disorder-  ssi   CKD (chronic kidney disease), stage II-  noted   Essential hypertension, benign   obs on medical bed.  Full code.  LOS 1 day.     Preslea Rhodus A 09/27/2014, 10:37 PM

## 2014-09-27 NOTE — ED Notes (Signed)
Pt presents with 4 day h/o nausea, vomiting and L sided abdominal pain.  Pt reports pain is constant and does not radiate.  Pt reports fever x 2 days; reports BLE edema and pain - denies any shortness of breath.

## 2014-09-27 NOTE — Progress Notes (Signed)
Called ED at [phone] to get report on new pt, nurse not available awaiting ED nurse to call report.Artemio Alyheryl Cheyann Blecha RN

## 2014-09-28 ENCOUNTER — Encounter (HOSPITAL_COMMUNITY): Payer: Self-pay | Admitting: *Deleted

## 2014-09-28 ENCOUNTER — Observation Stay (HOSPITAL_COMMUNITY): Payer: PPO

## 2014-09-28 DIAGNOSIS — N179 Acute kidney failure, unspecified: Secondary | ICD-10-CM | POA: Diagnosis not present

## 2014-09-28 DIAGNOSIS — R509 Fever, unspecified: Secondary | ICD-10-CM | POA: Diagnosis not present

## 2014-09-28 DIAGNOSIS — G43A0 Cyclical vomiting, not intractable: Secondary | ICD-10-CM | POA: Diagnosis not present

## 2014-09-28 DIAGNOSIS — E1159 Type 2 diabetes mellitus with other circulatory complications: Secondary | ICD-10-CM

## 2014-09-28 DIAGNOSIS — N182 Chronic kidney disease, stage 2 (mild): Secondary | ICD-10-CM | POA: Diagnosis not present

## 2014-09-28 DIAGNOSIS — N189 Chronic kidney disease, unspecified: Secondary | ICD-10-CM

## 2014-09-28 DIAGNOSIS — I10 Essential (primary) hypertension: Secondary | ICD-10-CM

## 2014-09-28 LAB — GLUCOSE, CAPILLARY
GLUCOSE-CAPILLARY: 258 mg/dL — AB (ref 70–99)
GLUCOSE-CAPILLARY: 243 mg/dL — AB (ref 70–99)
Glucose-Capillary: 243 mg/dL — ABNORMAL HIGH (ref 70–99)
Glucose-Capillary: 296 mg/dL — ABNORMAL HIGH (ref 70–99)
Glucose-Capillary: 305 mg/dL — ABNORMAL HIGH (ref 70–99)

## 2014-09-28 LAB — BASIC METABOLIC PANEL
ANION GAP: 11 (ref 5–15)
BUN: 24 mg/dL — ABNORMAL HIGH (ref 6–23)
CALCIUM: 8.8 mg/dL (ref 8.4–10.5)
CHLORIDE: 102 mmol/L (ref 96–112)
CO2: 23 mmol/L (ref 19–32)
CREATININE: 1.82 mg/dL — AB (ref 0.50–1.35)
GFR calc Af Amer: 53 mL/min — ABNORMAL LOW (ref >90)
GFR calc non Af Amer: 46 mL/min — ABNORMAL LOW (ref >90)
GLUCOSE: 291 mg/dL — AB (ref 70–99)
Potassium: 4.3 mmol/L (ref 3.5–5.1)
Sodium: 136 mmol/L (ref 135–145)

## 2014-09-28 LAB — INFLUENZA PANEL BY PCR (TYPE A & B)
H1N1 flu by pcr: NOT DETECTED
INFLBPCR: NEGATIVE
Influenza A By PCR: NEGATIVE

## 2014-09-28 LAB — CBC
HCT: 32.7 % — ABNORMAL LOW (ref 39.0–52.0)
HEMOGLOBIN: 11 g/dL — AB (ref 13.0–17.0)
MCH: 30.5 pg (ref 26.0–34.0)
MCHC: 33.6 g/dL (ref 30.0–36.0)
MCV: 90.6 fL (ref 78.0–100.0)
PLATELETS: 168 10*3/uL (ref 150–400)
RBC: 3.61 MIL/uL — ABNORMAL LOW (ref 4.22–5.81)
RDW: 13.2 % (ref 11.5–15.5)
WBC: 11.6 10*3/uL — ABNORMAL HIGH (ref 4.0–10.5)

## 2014-09-28 MED ORDER — PANTOPRAZOLE SODIUM 40 MG PO TBEC
40.0000 mg | DELAYED_RELEASE_TABLET | Freq: Every day | ORAL | Status: DC
Start: 2014-09-29 — End: 2014-09-29
  Administered 2014-09-29: 40 mg via ORAL
  Filled 2014-09-28: qty 1

## 2014-09-28 MED ORDER — TECHNETIUM TC 99M DIETHYLENETRIAME-PENTAACETIC ACID: 40.0000 | Freq: Once | INTRAVENOUS | Status: AC | PRN

## 2014-09-28 MED ORDER — INSULIN ASPART 100 UNIT/ML ~~LOC~~ SOLN: 0.0000 [IU] | Freq: Three times a day (TID) | SUBCUTANEOUS | Status: DC

## 2014-09-28 MED ORDER — ENOXAPARIN SODIUM 120 MG/0.8ML ~~LOC~~ SOLN
1.0000 mg/kg | Freq: Two times a day (BID) | SUBCUTANEOUS | Status: DC
  Administered 2014-09-28: 120 mg via SUBCUTANEOUS
  Filled 2014-09-28 (×2): qty 0.8

## 2014-09-28 MED ORDER — SODIUM CHLORIDE 0.9 % IV SOLN
INTRAVENOUS | Status: AC
  Administered 2014-09-28: 02:00:00 via INTRAVENOUS

## 2014-09-28 MED ORDER — ONDANSETRON HCL 4 MG PO TABS: 4.0000 mg | ORAL_TABLET | Freq: Four times a day (QID) | ORAL | Status: DC | PRN

## 2014-09-28 MED ORDER — HYDROCODONE-ACETAMINOPHEN 5-325 MG PO TABS
2.0000 | ORAL_TABLET | ORAL | Status: DC | PRN
  Administered 2014-09-28: 2 via ORAL
  Filled 2014-09-28: qty 2

## 2014-09-28 MED ORDER — INSULIN DETEMIR 100 UNIT/ML ~~LOC~~ SOLN
7.0000 [IU] | Freq: Every day | SUBCUTANEOUS | Status: DC
  Administered 2014-09-29: 7 [IU] via SUBCUTANEOUS
  Filled 2014-09-28: qty 0.07

## 2014-09-28 MED ORDER — INSULIN ASPART 100 UNIT/ML ~~LOC~~ SOLN
0.0000 [IU] | Freq: Three times a day (TID) | SUBCUTANEOUS | Status: DC
  Administered 2014-09-28 (×2): 5 [IU] via SUBCUTANEOUS
  Administered 2014-09-28 – 2014-09-30 (×5): 3 [IU] via SUBCUTANEOUS
  Administered 2014-09-30: 2 [IU] via SUBCUTANEOUS
  Administered 2014-09-30: 3 [IU] via SUBCUTANEOUS

## 2014-09-28 MED ORDER — ONDANSETRON HCL 4 MG/2ML IJ SOLN
4.0000 mg | Freq: Four times a day (QID) | INTRAMUSCULAR | Status: DC | PRN
  Administered 2014-09-28 – 2014-09-29 (×4): 4 mg via INTRAVENOUS
  Filled 2014-09-28 (×3): qty 2

## 2014-09-28 MED ORDER — TECHNETIUM TO 99M ALBUMIN AGGREGATED
6.0000 | Freq: Once | INTRAVENOUS | Status: AC | PRN
  Administered 2014-09-28: 6 via INTRAVENOUS

## 2014-09-28 MED ORDER — HYDROMORPHONE HCL 1 MG/ML IJ SOLN
1.0000 mg | INTRAMUSCULAR | Status: DC | PRN
  Administered 2014-09-28 – 2014-09-29 (×3): 1 mg via INTRAVENOUS
  Filled 2014-09-28 (×3): qty 1

## 2014-09-28 MED ORDER — ENOXAPARIN SODIUM 40 MG/0.4ML ~~LOC~~ SOLN
40.0000 mg | SUBCUTANEOUS | Status: DC
  Administered 2014-09-29 – 2014-09-30 (×2): 40 mg via SUBCUTANEOUS
  Filled 2014-09-28 (×2): qty 0.4

## 2014-09-28 MED ORDER — ONDANSETRON HCL 4 MG/2ML IJ SOLN
INTRAMUSCULAR | Status: AC
  Filled 2014-09-28: qty 2

## 2014-09-28 NOTE — Progress Notes (Signed)
UR completed 

## 2014-09-28 NOTE — Progress Notes (Signed)
Inpatient Diabetes Program Recommendations  AACE/ADA: New Consensus Statement on Inpatient Glycemic Control (2013)  Target Ranges:  Prepandial:   less than 140 mg/dL      Peak postprandial:   less than 180 mg/dL (1-2 hours)      Critically ill patients:  140 - 180 mg/dL   Inpatient Diabetes Program Recommendations Insulin - IV drip/GlucoStabilizer: G Insulin - Basal: May want to order some low dose basal insulin.  Levemir works well with renal insufficiency-10 units att HS or daily.  Could you also order a current HgbA1C (as required since his last value was greater than 3 months ago.)  Thank you, Lenor CoffinAnn Maelyn Berrey, RN, CNS, Diabetes Coordinator  Pager [phone]) 8:00 am to 5:00pm Office [phone])  8:1715m - 5:00 pm

## 2014-09-28 NOTE — Plan of Care (Signed)
VQ scan negative for PE. Treatment dose Lovenox d/c'd. Lovenox VTE ppx dose started.

## 2014-09-28 NOTE — Progress Notes (Signed)
[facility] PROGRESS NOTE  [name] ZOX:[id number] DOB: 09-23-1975 DOA: 09/27/2014 PCP: Dorrene German, MD  Assessment/Plan: 1-fever, N/V and general malaise: concerns for flu -will continue supportive care -influenza by PCR pending -continue droplet precaution -patient out of therapeutic window for tamiflu; symptoms started approx 4 days ago -will monitor -PRN antipyretics and antiemetics as needed  2-acute on chronic renal failure (stage 2-3 at baseline): appears to be pre-renal due to dehydration and continue use of nephrotoxic agents -will hold nephrotoxic agents -continue IVF's -UA no suggesting UTI -will monitor Cr  3-SOB: with new lower ext edema on his right leg -will check V/Q scan and LE duplex -empirically cover with lovenox while waiting results  4-diabetes type 2 with PVD: -will use low dose levemir and SSI -will check A1C  5-essential HTN: holding antihypertensive agents in setting of soft BP, renal failure and dehydration -will monitor  6-GERD: will use PPI  Code Status: Full Family Communication: no family at bedside Disposition Plan: home when medically stable   Consultants:  None   Procedures:  See below for x-ray reports   LE duplex: pending   Antibiotics:  None   HPI/Subjective: Overnight with high grade fever, complaining of nausea and vomiting and mild SOB.  Objective: Filed Vitals:   09/28/14 2148  BP: 125/84  Pulse: 86  Temp: 98.8 F (37.1 C)  Resp: 16    Intake/Output Summary (Last 24 hours) at 09/28/14 2254 Last data filed at 09/28/14 2149  Gross per 24 hour  Intake 1688.75 ml  Output   1078 ml  Net 610.75 ml   Filed Weights   09/27/14 2341 09/28/14 0007 09/28/14 2148  Weight: 118.071 kg (260 lb 4.8 oz) 118.389 kg (261 lb) 117.618 kg (259 lb 4.8 oz)    Exam:   General:  Feeling bad; with myalgias, ongoing N/V and also complaining of SOB; overnight with fever  Cardiovascular: S1 and S2, no rubs or  gallops  Respiratory: no tachypnea, no wheezing; patient with scattered rhonchi  Abdomen: soft, NT, ND, positive BS  Musculoskeletal: no cyanosis or clubbing; patient with multiple toes amputation bilaterally  Data Reviewed: Basic Metabolic Panel:  Recent Labs Lab 09/27/14 1515 09/28/14 0630  NA 129* 136  K 4.3 4.3  CL 93* 102  CO2 27 23  GLUCOSE 426* 291*  BUN 28* 24*  CREATININE 2.37* 1.82*  CALCIUM 8.6 8.8   Liver Function Tests:  Recent Labs Lab 09/27/14 1515  AST 23  ALT 44  ALKPHOS 105  BILITOT 0.8  PROT 7.7  ALBUMIN 3.2*    Recent Labs Lab 09/27/14 1541  LIPASE 22   CBC:  Recent Labs Lab 09/27/14 1515 09/28/14 0630  WBC 12.2* 11.6*  NEUTROABS 9.6*  --   HGB 12.0* 11.0*  HCT 34.3* 32.7*  MCV 88.2 90.6  PLT 184 168   BNP (last 3 results)  Recent Labs  05/30/14 0041  BNP 19.2   CBG:  Recent Labs Lab 09/28/14 0012 09/28/14 0736 09/28/14 1108 09/28/14 1613 09/28/14 2147  GLUCAP 305* 258* 243* 296* 243*    Recent Results (from the past 240 hour(s))  Culture, blood (routine x 2)     Status: None (Preliminary result)   Collection Time: 09/27/14  3:05 PM  Result Value Ref Range Status   Specimen Description BLOOD ARM RIGHT  Final   Special Requests BOTTLES DRAWN AEROBIC AND ANAEROBIC 10CC  Final   Culture   Final  BLOOD CULTURE RECEIVED NO GROWTH TO DATE CULTURE WILL BE HELD FOR 5 DAYS BEFORE ISSUING A FINAL NEGATIVE REPORT Performed at [facility]    Report Status PENDING  Incomplete  Culture, blood (routine x 2)     Status: None (Preliminary result)   Collection Time: 09/27/14  3:09 PM  Result Value Ref Range Status   Specimen Description BLOOD RIGHT HAND  Final   Special Requests BOTTLES DRAWN AEROBIC AND ANAEROBIC 5CC  Final   Culture   Final           BLOOD CULTURE RECEIVED NO GROWTH TO DATE CULTURE WILL BE HELD FOR 5 DAYS BEFORE ISSUING A FINAL NEGATIVE REPORT Performed at [facility]     Report Status PENDING  Incomplete     Studies: Ct Abdomen Pelvis Wo Contrast  09/27/2014   CLINICAL DATA:  Four-day history of nausea and vomiting with left-sided abdominal pain  EXAM: CT ABDOMEN AND PELVIS WITHOUT CONTRAST  TECHNIQUE: Multidetector CT imaging of the abdomen and pelvis was performed following the standard protocol without IV contrast.  COMPARISON:  11/21/2013  FINDINGS: The lung bases are free of acute infiltrate or sizable effusion.  The liver, spleen, adrenal glands and pancreas are within normal limits. The gallbladder has been surgically removed. The kidneys are well visualized bilaterally. Fullness of the left renal collecting system is seen. No definitive ureteral stone is seen. This fullness extends to the ureterovesical junction. This may be related to edema from recently passed stone or a poorly calcified stone. Right kidney is somewhat malrotated but shows no obstructive changes.  The appendix is within normal limits. The bladder is partially distended. No pelvic mass lesion or sidewall adenopathy is noted. Some right inguinal lymph nodes are seen. Some of these demonstrate a normal fatty hilus. This may be related to the bilateral lower extremity swelling reported by patient. The largest of these lies adjacent to the right common femoral vein on image number 92 of series 2 it measures 3.7 x 1.8 cm in dimension. This was present on the prior exam but much smaller in size. This may be simply reactive in nature however.  IMPRESSION: Fullness in the left renal collecting system likely related to edema from recently passed stone or a small in a poorly calcified stone.  Lymphadenopathy particularly in the right inguinal region as described. This may simply be reactive in nature. Short-term followup is recommended to assess for resolution.   Electronically Signed   By: Alcide Clever [name]   On: 09/27/2014 19:23   Dg Chest 2 View  09/27/2014   CLINICAL DATA:  Left-sided pain and fevers   EXAM: CHEST  2 VIEW  COMPARISON:  05/30/2014  FINDINGS: The heart size and mediastinal contours are within normal limits. Both lungs are clear. The visualized skeletal structures are unremarkable.  IMPRESSION: No active cardiopulmonary disease.   Electronically Signed   By: Alcide Clever [name]   On: 09/27/2014 15:25   Nm Pulmonary Perf And Vent  09/28/2014   CLINICAL DATA:  Shortness of breath and chest pain for the past 3 days.  EXAM: NUCLEAR MEDICINE VENTILATION - PERFUSION LUNG SCAN  TECHNIQUE: Ventilation images were obtained in multiple projections using inhaled aerosol technetium 99 M DTPA. Perfusion images were obtained in multiple projections after intravenous injection of Tc-108m MAA.  RADIOPHARMACEUTICALS:  40 mCi Tc-30m DTPA aerosol and 6 mCi Tc-62m MAA  COMPARISON:  Chest radiographs obtained yesterday.  FINDINGS: Ventilation: Normal ventilation of both lungs. No  ventilation defects.  Perfusion: Normal perfusion of both lungs.  No perfusion defects.  IMPRESSION: Normal examination.  No evidence of pulmonary embolism.   Electronically Signed   By: Beckie SaltsSteven  Reid [name]   On: 09/28/2014 18:38    Scheduled Meds: . [START ON 09/29/2014] enoxaparin (LOVENOX) injection  40 mg Subcutaneous Q24H  . insulin aspart  0-9 Units Subcutaneous TID WC  . [START ON 09/29/2014] insulin detemir  7 Units Subcutaneous Daily  . ondansetron       Continuous Infusions:   Principal Problem:   Fever Active Problems:   Nausea & vomiting   AKI (acute kidney injury)   DM (diabetes mellitus) type II uncontrolled, periph vascular disorder   CKD (chronic kidney disease), stage II   Essential hypertension, benign   Abdominal pain, lower    Time spent: 30 minutes    Vassie LollMadera, Regino Fournet  [facility] Pager [phone]. If 7PM-7AM, please contact night-coverage at [email or web address], password [email or web address] 09/28/2014, 10:54 PM

## 2014-09-28 NOTE — Progress Notes (Signed)
ANTICOAGULATION CONSULT NOTE - Initial Consult  Pharmacy Consult for Lovenox Indication: pulmonary embolus  No Known Allergies  Patient Measurements: Height: 6\' 1"  (185.4 cm) Weight: 261 lb (118.389 kg) IBW/kg (Calculated) : 79.9   Vital Signs: Temp: 98.7 F (37.1 C) (04/21 0007) Temp Source: Oral (04/21 0007) BP: 122/85 mmHg (04/21 0007) Pulse Rate: 86 (04/21 0007)  Labs:  Recent Labs  09/27/14 1515  HGB 12.0*  HCT 34.3*  PLT 184  CREATININE 2.37*    Estimated Creatinine Clearance: 57.5 mL/min (by C-G formula based on Cr of 2.37).   Medical History: Past Medical History  Diagnosis Date  . Hypertension   . Diabetes mellitus     type 2 iddm x 18 yrs  . Vascular disease     poor circulation to left foot  . Acute osteomyelitis, ankle and foot 07/30/2010    Qualifier: Diagnosis of  By: Daiva EvesVan Dam MD, Remi Haggardornelius    . Anemia   . Renal insufficiency   . Hiatal hernia   . DDD (degenerative disc disease), lumbar   . Pancreatitis   . Gastroparesis   . Collagen vascular disease     Medications:  Prescriptions prior to admission  Medication Sig Dispense Refill Last Dose  . gabapentin (NEURONTIN) 100 MG capsule Take 2 capsules (200 mg total) by mouth 3 (three) times daily. 90 capsule 0 09/26/2014 at Unknown time  . lisinopril (PRINIVIL,ZESTRIL) 10 MG tablet Take 10 mg by mouth daily.   09/26/2014 at Unknown time  . metFORMIN (GLUCOPHAGE) 500 MG tablet Take 500 mg by mouth 2 (two) times daily with a meal.   09/26/2014 at Unknown time  . HYDROcodone-acetaminophen (NORCO/VICODIN) 5-325 MG per tablet Take 2 tablets by mouth every 4 (four) hours as needed for moderate pain. (Patient not taking: Reported on 09/27/2014) 20 tablet 0 Not Taking at Unknown time  . ondansetron (ZOFRAN ODT) 4 MG disintegrating tablet Take 1 tablet (4 mg total) by mouth every 8 (eight) hours as needed for nausea or vomiting. (Patient not taking: Reported on 09/27/2014) 10 tablet 0 Not Taking at Unknown time     Assessment: 39 y.o male to start on SQ lovenox for pulmonary embolus. Presented to ED 09/27/14 with complaint of abdominal pain.  Weight 118 kg, Ht 73 in, SCr 2.37, CrCl ~ 57 ml/min, H/H 12.0/34.3, PLTC 184K.  He has h/o  CKD, baseline SCr 1.8.  Not on anticoagulation PTA.  Patient reports that he does not have any history of bleeding.   Goal of Therapy:  Anti-Xa level 0.6-1 units/ml 4hrs after LMWH dose given Monitor platelets by anticoagulation protocol: Yes   Plan:  Lovenox 120 mg SQ q12 hours. CBC q72 hours Monitor for any change in renal function and for bleeding.  Noah Delaineuth Tekoa Amon, RPh Clinical Pharmacist Pager: [phone] 09/28/2014,2:00 AM

## 2014-09-29 DIAGNOSIS — N179 Acute kidney failure, unspecified: Secondary | ICD-10-CM | POA: Diagnosis not present

## 2014-09-29 DIAGNOSIS — M7989 Other specified soft tissue disorders: Secondary | ICD-10-CM

## 2014-09-29 DIAGNOSIS — I2699 Other pulmonary embolism without acute cor pulmonale: Secondary | ICD-10-CM | POA: Diagnosis not present

## 2014-09-29 DIAGNOSIS — E1159 Type 2 diabetes mellitus with other circulatory complications: Secondary | ICD-10-CM | POA: Diagnosis not present

## 2014-09-29 DIAGNOSIS — R509 Fever, unspecified: Secondary | ICD-10-CM | POA: Diagnosis not present

## 2014-09-29 DIAGNOSIS — N182 Chronic kidney disease, stage 2 (mild): Secondary | ICD-10-CM | POA: Diagnosis not present

## 2014-09-29 LAB — BASIC METABOLIC PANEL
Anion gap: 8 (ref 5–15)
BUN: 16 mg/dL (ref 6–23)
CALCIUM: 8.8 mg/dL (ref 8.4–10.5)
CO2: 27 mmol/L (ref 19–32)
CREATININE: 1.57 mg/dL — AB (ref 0.50–1.35)
Chloride: 100 mmol/L (ref 96–112)
GFR calc Af Amer: 64 mL/min — ABNORMAL LOW (ref >90)
GFR, EST NON AFRICAN AMERICAN: 55 mL/min — AB (ref >90)
Glucose, Bld: 222 mg/dL — ABNORMAL HIGH (ref 70–99)
Potassium: 4.2 mmol/L (ref 3.5–5.1)
SODIUM: 135 mmol/L (ref 135–145)

## 2014-09-29 LAB — GLUCOSE, CAPILLARY
GLUCOSE-CAPILLARY: 217 mg/dL — AB (ref 70–99)
GLUCOSE-CAPILLARY: 222 mg/dL — AB (ref 70–99)
GLUCOSE-CAPILLARY: 215 mg/dL — AB (ref 70–99)
Glucose-Capillary: 234 mg/dL — ABNORMAL HIGH (ref 70–99)

## 2014-09-29 LAB — URINE CULTURE
COLONY COUNT: NO GROWTH
Culture: NO GROWTH

## 2014-09-29 LAB — HIV ANTIBODY (ROUTINE TESTING W REFLEX): HIV Screen 4th Generation wRfx: NONREACTIVE

## 2014-09-29 MED ORDER — HYDRALAZINE HCL 20 MG/ML IJ SOLN: 10.0000 mg | Freq: Three times a day (TID) | INTRAMUSCULAR | Status: DC | PRN

## 2014-09-29 MED ORDER — HYDROCODONE-ACETAMINOPHEN 5-325 MG PO TABS
1.0000 | ORAL_TABLET | Freq: Four times a day (QID) | ORAL | Status: DC | PRN
  Administered 2014-09-29: 2 via ORAL
  Filled 2014-09-29 (×2): qty 2

## 2014-09-29 MED ORDER — BACLOFEN 10 MG PO TABS
10.0000 mg | ORAL_TABLET | Freq: Three times a day (TID) | ORAL | Status: DC | PRN
Start: 2014-09-29 — End: 2014-09-30
  Filled 2014-09-29: qty 1

## 2014-09-29 MED ORDER — PANTOPRAZOLE SODIUM 40 MG PO TBEC
40.0000 mg | DELAYED_RELEASE_TABLET | Freq: Two times a day (BID) | ORAL | Status: DC
  Administered 2014-09-30: 40 mg via ORAL
  Filled 2014-09-29: qty 1

## 2014-09-29 MED ORDER — INSULIN DETEMIR 100 UNIT/ML ~~LOC~~ SOLN
10.0000 [IU] | Freq: Every day | SUBCUTANEOUS | Status: DC
  Administered 2014-09-30: 10 [IU] via SUBCUTANEOUS
  Filled 2014-09-29 (×2): qty 0.1

## 2014-09-29 MED ORDER — SODIUM CHLORIDE 0.9 % IV SOLN
INTRAVENOUS | Status: AC
  Administered 2014-09-30: 06:00:00 via INTRAVENOUS

## 2014-09-29 MED ORDER — ACETAMINOPHEN 325 MG PO TABS
650.0000 mg | ORAL_TABLET | Freq: Four times a day (QID) | ORAL | Status: DC | PRN
  Administered 2014-09-29 – 2014-09-30 (×2): 650 mg via ORAL
  Filled 2014-09-29: qty 2

## 2014-09-29 NOTE — Progress Notes (Signed)
UR completed 

## 2014-09-29 NOTE — Progress Notes (Signed)
Inpatient Diabetes Program Recommendations  AACE/ADA: New Consensus Statement on Inpatient Glycemic Control (2013)  Target Ranges:  Prepandial:   less than 140 mg/dL      Peak postprandial:   less than 180 mg/dL (1-2 hours)      Critically ill patients:  140 - 180 mg/dL   Note:  Patient started on Levemir 7 units today.  A1C pending.  If insulin is required at discharge, please alert nursing staff to educate patient/family prior to discharge.   Thank you.  Tayven Renteria S. Elsie Lincolnouth, RN, CNS, CDE Inpatient Diabetes Program, team pager [phone]

## 2014-09-29 NOTE — Progress Notes (Signed)
*  PRELIMINARY RESULTS* Vascular Ultrasound Right lower extremity venous duplex has been completed.  Preliminary findings: negative for DVT in right lower extremity and left common femoral vein.   Farrel DemarkJill Eunice, RDMS, RVT  09/29/2014, 8:52 AM

## 2014-09-29 NOTE — Progress Notes (Signed)
[facility] PROGRESS NOTE  Curtis FettersLamont Clark ZOX:[id number] DOB: 03/19/1976 DOA: 09/27/2014 PCP: Dorrene GermanAVBUERE,EDWIN A, MD  Assessment/Plan: 1-fever, N/V and general malaise: concerns for flu vs viral infection/viral gastroenteritis -will continue supportive care -influenza by PCR neg -will d/c droplet precaution -continue PRN antipyretics and antiemetics as needed  2-acute on chronic renal failure (stage 2-3 at baseline): appears to be pre-renal due to dehydration and continue use of nephrotoxic agents -will continue holding nephrotoxic agents -continue IVF's for another 10 hours -UA no suggesting UTI -Cr trending down  3-SOB: with new lower ext edema on his right leg -V/Q scan and LE duplex neg for PE and DVT respectively  -patient is no longer SOB; most likely atelectasis   4-diabetes type 2 with PVD: -will continue levemir and SSI. Since CBG's continue to be elevated will increase levemir to 10 units.  -will follow A1C  5-essential HTN: holding antihypertensive agents in setting of soft BP, renal failure and dehydration -will monitor -PRN hydralazine -renal function has improved and BP rising, most likely will resume home meds in am  6-GERD: will use PPI, will increase to BID for symptoms control  Code Status: Full Family Communication: no family at bedside Disposition Plan: home when medically stable   Consultants:  None   Procedures:  See below for x-ray reports   LE duplex: neg for DVT/SVT and baker cyst bilaterally  Antibiotics:  None   HPI/Subjective: Feeling better, no fever, no CP or SOB. Still with abd discomfort and N/V.  Objective: Filed Vitals:   09/29/14 2128  BP: 131/82  Pulse: 79  Temp: 99 F (37.2 C)  Resp: 16    Intake/Output Summary (Last 24 hours) at 09/29/14 2147 Last data filed at 09/29/14 1919  Gross per 24 hour  Intake   1080 ml  Output    852 ml  Net    228 ml   Filed Weights   09/28/14 0007 09/28/14 2148 09/29/14 2128   Weight: 118.389 kg (261 lb) 117.618 kg (259 lb 4.8 oz) 116.257 kg (256 lb 4.8 oz)    Exam:   General:  Feeling better, no fever; denies CP and SOB. Still with mild abd discomfort and complaining of N/V   Cardiovascular: S1 and S2, no rubs or gallops  Respiratory: no tachypnea, no wheezing; patient with mild scattered rhonchi; good O2 sat on RA  Abdomen: soft, NT, ND, positive BS  Musculoskeletal: no cyanosis or clubbing; patient with multiple toes amputation bilaterally; trace edema and no erythema  Data Reviewed: Basic Metabolic Panel:  Recent Labs Lab 09/27/14 1515 09/28/14 0630 09/29/14 0655  NA 129* 136 135  K 4.3 4.3 4.2  CL 93* 102 100  CO2 27 23 27   GLUCOSE 426* 291* 222*  BUN 28* 24* 16  CREATININE 2.37* 1.82* 1.57*  CALCIUM 8.6 8.8 8.8   Liver Function Tests:  Recent Labs Lab 09/27/14 1515  AST 23  ALT 44  ALKPHOS 105  BILITOT 0.8  PROT 7.7  ALBUMIN 3.2*    Recent Labs Lab 09/27/14 1541  LIPASE 22   CBC:  Recent Labs Lab 09/27/14 1515 09/28/14 0630  WBC 12.2* 11.6*  NEUTROABS 9.6*  --   HGB 12.0* 11.0*  HCT 34.3* 32.7*  MCV 88.2 90.6  PLT 184 168   BNP (last 3 results)  Recent Labs  05/30/14 0041  BNP 19.2   CBG:  Recent Labs Lab 09/28/14 2147 09/29/14 0753 09/29/14 1137 09/29/14 1626 09/29/14 2143  GLUCAP 243* 234* 217* 222* 215*  Recent Results (from the past 240 hour(s))  Culture, blood (routine x 2)     Status: None (Preliminary result)   Collection Time: 09/27/14  3:05 PM  Result Value Ref Range Status   Specimen Description BLOOD ARM RIGHT  Final   Special Requests BOTTLES DRAWN AEROBIC AND ANAEROBIC 10CC  Final   Culture   Final           BLOOD CULTURE RECEIVED NO GROWTH TO DATE CULTURE WILL BE HELD FOR 5 DAYS BEFORE ISSUING A FINAL NEGATIVE REPORT Performed at [facility]    Report Status PENDING  Incomplete  Culture, blood (routine x 2)     Status: None (Preliminary result)   Collection Time:  09/27/14  3:09 PM  Result Value Ref Range Status   Specimen Description BLOOD RIGHT HAND  Final   Special Requests BOTTLES DRAWN AEROBIC AND ANAEROBIC 5CC  Final   Culture   Final           BLOOD CULTURE RECEIVED NO GROWTH TO DATE CULTURE WILL BE HELD FOR 5 DAYS BEFORE ISSUING A FINAL NEGATIVE REPORT Performed at [facility]    Report Status PENDING  Incomplete  Urine culture     Status: None   Collection Time: 09/27/14  6:44 PM  Result Value Ref Range Status   Specimen Description URINE, RANDOM  Final   Special Requests NONE  Final   Colony Count NO GROWTH Performed at [facility]   Final   Culture NO GROWTH Performed at [facility]   Final   Report Status 09/29/2014 FINAL  Final     Studies: Nm Pulmonary Perf And Vent  09/28/2014   CLINICAL DATA:  Shortness of breath and chest pain for the past 3 days.  EXAM: NUCLEAR MEDICINE VENTILATION - PERFUSION LUNG SCAN  TECHNIQUE: Ventilation images were obtained in multiple projections using inhaled aerosol technetium 99 M DTPA. Perfusion images were obtained in multiple projections after intravenous injection of Tc-80m MAA.  RADIOPHARMACEUTICALS:  40 mCi Tc-36m DTPA aerosol and 6 mCi Tc-31m MAA  COMPARISON:  Chest radiographs obtained yesterday.  FINDINGS: Ventilation: Normal ventilation of both lungs. No ventilation defects.  Perfusion: Normal perfusion of both lungs.  No perfusion defects.  IMPRESSION: Normal examination.  No evidence of pulmonary embolism.   Electronically Signed   By: Beckie Salts [name]   On: 09/28/2014 18:38    Scheduled Meds: . enoxaparin (LOVENOX) injection  40 mg Subcutaneous Q24H  . insulin aspart  0-9 Units Subcutaneous TID WC  . [START ON 09/30/2014] insulin detemir  10 Units Subcutaneous Daily  . pantoprazole  40 mg Oral BID   Continuous Infusions: . sodium chloride      Principal Problem:   Fever Active Problems:   Nausea & vomiting   AKI (acute kidney injury)   DM (diabetes  mellitus) type II uncontrolled, periph vascular disorder   CKD (chronic kidney disease), stage II   Essential hypertension, benign   Abdominal pain, lower    Time spent: 30 minutes    Vassie Loll  [facility] Pager [phone]. If 7PM-7AM, please contact night-coverage at [email or web address], password [facility] 09/29/2014, 9:47 PM

## 2014-09-30 DIAGNOSIS — N182 Chronic kidney disease, stage 2 (mild): Secondary | ICD-10-CM | POA: Diagnosis not present

## 2014-09-30 DIAGNOSIS — R509 Fever, unspecified: Secondary | ICD-10-CM | POA: Diagnosis not present

## 2014-09-30 DIAGNOSIS — N179 Acute kidney failure, unspecified: Secondary | ICD-10-CM | POA: Diagnosis not present

## 2014-09-30 DIAGNOSIS — E1159 Type 2 diabetes mellitus with other circulatory complications: Secondary | ICD-10-CM | POA: Diagnosis not present

## 2014-09-30 DIAGNOSIS — K219 Gastro-esophageal reflux disease without esophagitis: Secondary | ICD-10-CM | POA: Insufficient documentation

## 2014-09-30 LAB — GLUCOSE, CAPILLARY
GLUCOSE-CAPILLARY: 212 mg/dL — AB (ref 70–99)
GLUCOSE-CAPILLARY: 171 mg/dL — AB (ref 70–99)
Glucose-Capillary: 221 mg/dL — ABNORMAL HIGH (ref 70–99)

## 2014-09-30 LAB — BASIC METABOLIC PANEL
Anion gap: 13 (ref 5–15)
BUN: 22 mg/dL (ref 6–23)
CALCIUM: 9.4 mg/dL (ref 8.4–10.5)
CO2: 26 mmol/L (ref 19–32)
Chloride: 104 mmol/L (ref 96–112)
Creatinine, Ser: 1.03 mg/dL (ref 0.50–1.35)
GFR calc non Af Amer: >90 mL/min (ref >90)
GLUCOSE: 94 mg/dL (ref 70–99)
POTASSIUM: 4.1 mmol/L (ref 3.5–5.1)
SODIUM: 143 mmol/L (ref 135–145)

## 2014-09-30 LAB — HEMOGLOBIN A1C
HEMOGLOBIN A1C: 13.6 % — AB (ref 4.8–5.6)
Mean Plasma Glucose: 344 mg/dL

## 2014-09-30 MED ORDER — BLOOD GLUCOSE MONITOR KIT: PACK | Status: DC

## 2014-09-30 MED ORDER — HYDROCODONE-ACETAMINOPHEN 5-325 MG PO TABS: 1.0000 | ORAL_TABLET | Freq: Four times a day (QID) | ORAL | Status: DC | PRN

## 2014-09-30 MED ORDER — ONDANSETRON 4 MG PO TBDP: 4.0000 mg | ORAL_TABLET | Freq: Three times a day (TID) | ORAL | Status: DC | PRN

## 2014-09-30 MED ORDER — INSULIN PEN NEEDLE 31G X 5 MM MISC: Status: DC

## 2014-09-30 MED ORDER — INSULIN ISOPHANE & REGULAR (HUMAN 70-30)100 UNIT/ML KWIKPEN: 12.0000 [IU] | PEN_INJECTOR | Freq: Two times a day (BID) | SUBCUTANEOUS | Status: DC

## 2014-09-30 MED ORDER — BACLOFEN 10 MG PO TABS: 10.0000 mg | ORAL_TABLET | Freq: Three times a day (TID) | ORAL | Status: DC | PRN

## 2014-09-30 MED ORDER — PANTOPRAZOLE SODIUM 40 MG PO TBEC: 40.0000 mg | DELAYED_RELEASE_TABLET | Freq: Two times a day (BID) | ORAL | Status: DC

## 2014-09-30 MED ORDER — LISINOPRIL 20 MG PO TABS: 20.0000 mg | ORAL_TABLET | Freq: Every day | ORAL | Status: DC

## 2014-09-30 NOTE — Discharge Summary (Signed)
Physician Discharge Summary  [name] OPL:[id number] DOB: [birth year] DOA: 09/27/2014  PCP: Dorrene German, MD  Admit date: 09/27/2014 Discharge date: 09/30/2014  Time spent: 30 minutes  Recommendations for Outpatient Follow-up:  1. Reassess BP and adjust antihypertensive agents as needed 2. Please repeat BMET to follow electrolytes and renal function 3. Close follow up to CBG;s and A1C; adjust hypoglycemic regimen as needed. Metformin disocntinued  Discharge Diagnoses:  Principal Problem:   Fever Active Problems:   Nausea & vomiting   AKI (acute kidney injury)   DM (diabetes mellitus) type II uncontrolled, periph vascular disorder   CKD (chronic kidney disease), stage II   Essential hypertension, benign   Abdominal pain, lower   Discharge Condition: stable and improved. No CP, SOB, nausea, vomiting or any other acute complaints. Will follow with PCP in 10 days for hospital follow up  Diet recommendation: low sugar and low sodium diet  Filed Weights   09/28/14 0007 09/28/14 2148 09/29/14 2128  Weight: 118.389 kg (261 lb) 117.618 kg (259 lb 4.8 oz) 116.257 kg (256 lb 4.8 oz)    History of present illness:  39 yo male h/o complicated dm with h/o several ble amputations, osteomyelitis, PVD, CKD baseline 1.8, pancreatitis once 3 years ago, gastroparesis comes in with 3 days of left upper abdominal pain below his ribs with associated fever, nausea and vomiting which is nonbloody. No diarrhea. Pt states the pain is pleuritic, when he takes a deep breath the pain goes from his luq to his back. He has also had significant swelling in his right leg that has progressively worsened for the last 3 days and is painful also. He has not noted any redness, but has noted pain up to the knee area. His feet look good, there are no open wounds. He denies any wounds or rashes anywhere on his skin. Denies any sob. He has been running fever for over a day. Denies any flank pain.no recent  trauma, surgery or traveling. He was referred for admission for fever in the setting of abd pain of unclear etiology.  Hospital Course:  1-fever, N/V and general malaise: concerns for flu vs viral infection/viral gastroenteritis -will continue supportive care -influenza by PCR neg -continue PRN antipyretics and antiemetics as needed -control sugar and advise to maintain good hydration   2-acute on chronic renal failure (stage 2): appears to be pre-renal due to dehydration and continue use of nephrotoxic agents -completely resolved with IVF's resuscitation -advise to keep himself well hydrated -UA no suggesting UTI -Cr1.03 at discharge; GFR 86-90  3-SOB: with new lower ext edema on his right leg -V/Q scan and LE duplex neg for PE and DVT respectively  -most likely atelectasis with positive viral infection -patient is feeling good today and no longer SOB  4-diabetes type 2 with PVD: -A1C 13.6 -will stop metformin -will discharge on 70/30 BID Close follow up with PCP for further adjustment to hypoglycemic regimen -advise to follow low carb diet  5-essential HTN: BP has now stabilize and rising -will resume home antihypertensive regimen  6-GERD: will use PPI BID for better symptoms control  Procedures:  See below for x-ray reports   Consultations:  None   Discharge Exam: Filed Vitals:   09/30/14 0930  BP: 147/86  Pulse: 86  Temp: 98.9 F (37.2 C)  Resp: 15    General: Feeling a lot better, no fever; denies CP and SOB. Denies any further N/V, is hungry and reports just mild soreness in his tummy.  Cardiovascular: S1 and S2, no rubs or gallops  Respiratory: no tachypnea, no wheezing; patient with mild scattered rhonchi; good O2 sat on RA  Abdomen: soft, mild tenderness on his left CVA, ND, positive BS  Musculoskeletal: no cyanosis or clubbing; patient with multiple toes amputation bilaterally; trace edema and no erythema  Discharge  Instructions   Discharge Instructions    Diet - low sodium heart healthy    Complete by:  As directed      Discharge instructions    Complete by:  As directed   Please take medications as prescribed Follow low carbohydrates diet and make sure you do not skip meals Keep yourself well hydrated Arrange follow up with PCP in 10 days Arrange follow up with ophthalmology service to have your eyes checked  Low calorie diet and increase exercise is recommended to lose weight Check blood sugar twice a day          Current Discharge Medication List    START taking these medications   Details  baclofen (LIORESAL) 10 MG tablet Take 1 tablet (10 mg total) by mouth 3 (three) times daily as needed for muscle spasms. Qty: 30 each, Refills: 0    blood glucose meter kit and supplies KIT Dispense based on patient and insurance preference. Use to check blood sugar two times daily as directed. (FOR ICD-9 250.00, 250.01). Qty: 1 each, Refills: 0    Insulin Isophane & Regular Human (HUMULIN 70/30 PEN) (70-30) 100 UNIT/ML PEN Inject 12 Units into the skin 2 (two) times daily. Qty: 15 mL, Refills: 11    Insulin Pen Needle 31G X 5 MM MISC Use one needle to inject insulin with flexpen as indicated twice a day Qty: 100 each, Refills: 3    pantoprazole (PROTONIX) 40 MG tablet Take 1 tablet (40 mg total) by mouth 2 (two) times daily. Qty: 60 tablet, Refills: 1      CONTINUE these medications which have CHANGED   Details  HYDROcodone-acetaminophen (NORCO/VICODIN) 5-325 MG per tablet Take 1 tablet by mouth every 6 (six) hours as needed for severe pain. Qty: 30 tablet, Refills: 0    lisinopril (PRINIVIL,ZESTRIL) 20 MG tablet Take 1 tablet (20 mg total) by mouth daily. Qty: 30 tablet, Refills: 1    ondansetron (ZOFRAN ODT) 4 MG disintegrating tablet Take 1 tablet (4 mg total) by mouth every 8 (eight) hours as needed for nausea or vomiting. Qty: 30 tablet, Refills: 0      CONTINUE these medications  which have NOT CHANGED   Details  gabapentin (NEURONTIN) 100 MG capsule Take 2 capsules (200 mg total) by mouth 3 (three) times daily. Qty: 90 capsule, Refills: 0      STOP taking these medications     metFORMIN (GLUCOPHAGE) 500 MG tablet        No Known Allergies Follow-up Information    Follow up with AVBUERE,EDWIN A, MD. Schedule an appointment as soon as possible for a visit in 10 days.   Specialty:  Internal Medicine   Contact information:   [place] [id number] [phone]       The results of significant diagnostics from this hospitalization (including imaging, microbiology, ancillary and laboratory) are listed below for reference.    Significant Diagnostic Studies: Ct Abdomen Pelvis Wo Contrast  09/27/2014   CLINICAL DATA:  Four-day history of nausea and vomiting with left-sided abdominal pain  EXAM: CT ABDOMEN AND PELVIS WITHOUT CONTRAST  TECHNIQUE: Multidetector CT imaging of the abdomen and pelvis  was performed following the standard protocol without IV contrast.  COMPARISON:  11/21/2013  FINDINGS: The lung bases are free of acute infiltrate or sizable effusion.  The liver, spleen, adrenal glands and pancreas are within normal limits. The gallbladder has been surgically removed. The kidneys are well visualized bilaterally. Fullness of the left renal collecting system is seen. No definitive ureteral stone is seen. This fullness extends to the ureterovesical junction. This may be related to edema from recently passed stone or a poorly calcified stone. Right kidney is somewhat malrotated but shows no obstructive changes.  The appendix is within normal limits. The bladder is partially distended. No pelvic mass lesion or sidewall adenopathy is noted. Some right inguinal lymph nodes are seen. Some of these demonstrate a normal fatty hilus. This may be related to the bilateral lower extremity swelling reported by patient. The largest of these lies adjacent to the  right common femoral vein on image number 92 of series 2 it measures 3.7 x 1.8 cm in dimension. This was present on the prior exam but much smaller in size. This may be simply reactive in nature however.  IMPRESSION: Fullness in the left renal collecting system likely related to edema from recently passed stone or a small in a poorly calcified stone.  Lymphadenopathy particularly in the right inguinal region as described. This may simply be reactive in nature. Short-term followup is recommended to assess for resolution.   Electronically Signed   By: Inez Catalina [name]   On: 09/27/2014 19:23   Dg Chest 2 View  09/27/2014   CLINICAL DATA:  Left-sided pain and fevers  EXAM: CHEST  2 VIEW  COMPARISON:  05/30/2014  FINDINGS: The heart size and mediastinal contours are within normal limits. Both lungs are clear. The visualized skeletal structures are unremarkable.  IMPRESSION: No active cardiopulmonary disease.   Electronically Signed   By: Inez Catalina [name]   On: 09/27/2014 15:25   Nm Pulmonary Perf And Vent  09/28/2014   CLINICAL DATA:  Shortness of breath and chest pain for the past 3 days.  EXAM: NUCLEAR MEDICINE VENTILATION - PERFUSION LUNG SCAN  TECHNIQUE: Ventilation images were obtained in multiple projections using inhaled aerosol technetium 99 M DTPA. Perfusion images were obtained in multiple projections after intravenous injection of Tc-45m MAA.  RADIOPHARMACEUTICALS:  40 mCi Tc-8m DTPA aerosol and 6 mCi Tc-32m MAA  COMPARISON:  Chest radiographs obtained yesterday.  FINDINGS: Ventilation: Normal ventilation of both lungs. No ventilation defects.  Perfusion: Normal perfusion of both lungs.  No perfusion defects.  IMPRESSION: Normal examination.  No evidence of pulmonary embolism.   Electronically Signed   By: Claudie Revering [name]   On: 09/28/2014 18:38    Microbiology: Recent Results (from the past 240 hour(s))  Culture, blood (routine x 2)     Status: None (Preliminary result)   Collection Time:  09/27/14  3:05 PM  Result Value Ref Range Status   Specimen Description BLOOD ARM RIGHT  Final   Special Requests BOTTLES DRAWN AEROBIC AND ANAEROBIC 10CC  Final   Culture   Final           BLOOD CULTURE RECEIVED NO GROWTH TO DATE CULTURE WILL BE HELD FOR 5 DAYS BEFORE ISSUING A FINAL NEGATIVE REPORT Performed at [facility]    Report Status PENDING  Incomplete  Culture, blood (routine x 2)     Status: None (Preliminary result)   Collection Time: 09/27/14  3:09 PM  Result Value Ref Range Status  Specimen Description BLOOD RIGHT HAND  Final   Special Requests BOTTLES DRAWN AEROBIC AND ANAEROBIC 5CC  Final   Culture   Final           BLOOD CULTURE RECEIVED NO GROWTH TO DATE CULTURE WILL BE HELD FOR 5 DAYS BEFORE ISSUING A FINAL NEGATIVE REPORT Performed at [facility]    Report Status PENDING  Incomplete  Urine culture     Status: None   Collection Time: 09/27/14  6:44 PM  Result Value Ref Range Status   Specimen Description URINE, RANDOM  Final   Special Requests NONE  Final   Colony Count NO GROWTH Performed at [facility]   Final   Culture NO GROWTH Performed at [facility]   Final   Report Status 09/29/2014 FINAL  Final     Labs: Basic Metabolic Panel:  Recent Labs Lab 09/27/14 1515 09/28/14 0630 09/29/14 0655 09/30/14 0655  NA 129* 136 135 143  K 4.3 4.3 4.2 4.1  CL 93* 102 100 104  CO2 $Re'27 23 27 26  'uvH$ GLUCOSE 426* 291* 222* 94  BUN 28* 24* 16 22  CREATININE 2.37* 1.82* 1.57* 1.03  CALCIUM 8.6 8.8 8.8 9.4   Liver Function Tests:  Recent Labs Lab 09/27/14 1515  AST 23  ALT 44  ALKPHOS 105  BILITOT 0.8  PROT 7.7  ALBUMIN 3.2*    Recent Labs Lab 09/27/14 1541  LIPASE 22   CBC:  Recent Labs Lab 09/27/14 1515 09/28/14 0630  WBC 12.2* 11.6*  NEUTROABS 9.6*  --   HGB 12.0* 11.0*  HCT 34.3* 32.7*  MCV 88.2 90.6  PLT 184 168   BNP (last 3 results)  Recent Labs  05/30/14 0041  BNP 19.2   CBG:  Recent  Labs Lab 09/29/14 1137 09/29/14 1626 09/29/14 2143 09/30/14 0734 09/30/14 1146  GLUCAP 217* 222* 215* 212* 221*    Signed:  Barton Dubois  [facility] 09/30/2014, 1:16 PM

## 2014-09-30 NOTE — Progress Notes (Signed)
Utilization Review completed.  

## 2014-09-30 NOTE — Progress Notes (Signed)
Diabetic diet education printed out and discussed with patient.Patient have been on insulin before,patient knows how to self administer insulin to himself.Insulin side effects like  Signs and symptoms of low blood and high blood explained to patient.Clarifications and questions from patient ,anwered and explained to patient.

## 2014-10-01 ENCOUNTER — Encounter (HOSPITAL_COMMUNITY): Payer: Self-pay | Admitting: *Deleted

## 2014-10-01 ENCOUNTER — Emergency Department (HOSPITAL_COMMUNITY): Payer: PPO

## 2014-10-01 ENCOUNTER — Inpatient Hospital Stay (HOSPITAL_COMMUNITY)
Admission: EM | Admit: 2014-10-01 | Discharge: 2014-10-05 | DRG: 617 | Disposition: A | Discharge status: Home / Self Care | Payer: PPO | Attending: Internal Medicine | Admitting: Internal Medicine

## 2014-10-01 DIAGNOSIS — I129 Hypertensive chronic kidney disease with stage 1 through stage 4 chronic kidney disease, or unspecified chronic kidney disease: Secondary | ICD-10-CM | POA: Diagnosis not present

## 2014-10-01 DIAGNOSIS — IMO0002 Reserved for concepts with insufficient information to code with codable children: Secondary | ICD-10-CM | POA: Diagnosis present

## 2014-10-01 DIAGNOSIS — L97509 Non-pressure chronic ulcer of other part of unspecified foot with unspecified severity: Secondary | ICD-10-CM

## 2014-10-01 DIAGNOSIS — K219 Gastro-esophageal reflux disease without esophagitis: Secondary | ICD-10-CM | POA: Diagnosis present

## 2014-10-01 DIAGNOSIS — E1165 Type 2 diabetes mellitus with hyperglycemia: Secondary | ICD-10-CM

## 2014-10-01 DIAGNOSIS — E08621 Diabetes mellitus due to underlying condition with foot ulcer: Secondary | ICD-10-CM | POA: Diagnosis not present

## 2014-10-01 DIAGNOSIS — K3184 Gastroparesis: Secondary | ICD-10-CM | POA: Diagnosis not present

## 2014-10-01 DIAGNOSIS — I10 Essential (primary) hypertension: Secondary | ICD-10-CM | POA: Diagnosis not present

## 2014-10-01 DIAGNOSIS — E11621 Type 2 diabetes mellitus with foot ulcer: Secondary | ICD-10-CM | POA: Diagnosis not present

## 2014-10-01 DIAGNOSIS — M869 Osteomyelitis, unspecified: Secondary | ICD-10-CM

## 2014-10-01 DIAGNOSIS — M86671 Other chronic osteomyelitis, right ankle and foot: Secondary | ICD-10-CM | POA: Diagnosis present

## 2014-10-01 DIAGNOSIS — M86171 Other acute osteomyelitis, right ankle and foot: Secondary | ICD-10-CM | POA: Diagnosis not present

## 2014-10-01 DIAGNOSIS — E1140 Type 2 diabetes mellitus with diabetic neuropathy, unspecified: Secondary | ICD-10-CM | POA: Diagnosis present

## 2014-10-01 DIAGNOSIS — N183 Chronic kidney disease, stage 3 unspecified: Secondary | ICD-10-CM | POA: Diagnosis present

## 2014-10-01 DIAGNOSIS — E1159 Type 2 diabetes mellitus with other circulatory complications: Secondary | ICD-10-CM

## 2014-10-01 DIAGNOSIS — L089 Local infection of the skin and subcutaneous tissue, unspecified: Secondary | ICD-10-CM

## 2014-10-01 DIAGNOSIS — Z9049 Acquired absence of other specified parts of digestive tract: Secondary | ICD-10-CM | POA: Diagnosis not present

## 2014-10-01 DIAGNOSIS — E1151 Type 2 diabetes mellitus with diabetic peripheral angiopathy without gangrene: Secondary | ICD-10-CM | POA: Diagnosis present

## 2014-10-01 DIAGNOSIS — E11628 Type 2 diabetes mellitus with other skin complications: Secondary | ICD-10-CM

## 2014-10-01 DIAGNOSIS — K210 Gastro-esophageal reflux disease with esophagitis: Secondary | ICD-10-CM

## 2014-10-01 DIAGNOSIS — Z89422 Acquired absence of other left toe(s): Secondary | ICD-10-CM | POA: Diagnosis not present

## 2014-10-01 DIAGNOSIS — F1290 Cannabis use, unspecified, uncomplicated: Secondary | ICD-10-CM | POA: Diagnosis not present

## 2014-10-01 DIAGNOSIS — I739 Peripheral vascular disease, unspecified: Secondary | ICD-10-CM | POA: Diagnosis not present

## 2014-10-01 DIAGNOSIS — L97519 Non-pressure chronic ulcer of other part of right foot with unspecified severity: Secondary | ICD-10-CM | POA: Diagnosis not present

## 2014-10-01 DIAGNOSIS — Z794 Long term (current) use of insulin: Secondary | ICD-10-CM | POA: Diagnosis not present

## 2014-10-01 DIAGNOSIS — L03031 Cellulitis of right toe: Secondary | ICD-10-CM | POA: Diagnosis not present

## 2014-10-01 DIAGNOSIS — F17210 Nicotine dependence, cigarettes, uncomplicated: Secondary | ICD-10-CM | POA: Diagnosis not present

## 2014-10-01 DIAGNOSIS — E1169 Type 2 diabetes mellitus with other specified complication: Secondary | ICD-10-CM

## 2014-10-01 DIAGNOSIS — D62 Acute posthemorrhagic anemia: Secondary | ICD-10-CM | POA: Diagnosis not present

## 2014-10-01 DIAGNOSIS — E1122 Type 2 diabetes mellitus with diabetic chronic kidney disease: Secondary | ICD-10-CM | POA: Diagnosis present

## 2014-10-01 DIAGNOSIS — M79671 Pain in right foot: Secondary | ICD-10-CM | POA: Diagnosis present

## 2014-10-01 DIAGNOSIS — Z716 Tobacco abuse counseling: Secondary | ICD-10-CM | POA: Diagnosis not present

## 2014-10-01 DIAGNOSIS — K5900 Constipation, unspecified: Secondary | ICD-10-CM | POA: Diagnosis not present

## 2014-10-01 HISTORY — DX: Gastro-esophageal reflux disease without esophagitis: K21.9

## 2014-10-01 LAB — CBC WITH DIFFERENTIAL/PLATELET
BASOS PCT: 0 % (ref 0–1)
Basophils Absolute: 0 10*3/uL (ref 0.0–0.1)
EOS ABS: 0.2 10*3/uL (ref 0.0–0.7)
Eosinophils Relative: 2 % (ref 0–5)
HCT: 30.3 % — ABNORMAL LOW (ref 39.0–52.0)
Hemoglobin: 10.1 g/dL — ABNORMAL LOW (ref 13.0–17.0)
LYMPHS PCT: 21 % (ref 12–46)
Lymphs Abs: 2.1 10*3/uL (ref 0.7–4.0)
MCH: 30 pg (ref 26.0–34.0)
MCHC: 33.3 g/dL (ref 30.0–36.0)
MCV: 89.9 fL (ref 78.0–100.0)
Monocytes Absolute: 0.9 10*3/uL (ref 0.1–1.0)
Monocytes Relative: 9 % (ref 3–12)
NEUTROS ABS: 7 10*3/uL (ref 1.7–7.7)
Neutrophils Relative %: 68 % (ref 43–77)
Platelets: 269 10*3/uL (ref 150–400)
RBC: 3.37 MIL/uL — AB (ref 4.22–5.81)
RDW: 12.8 % (ref 11.5–15.5)
WBC: 10.2 10*3/uL (ref 4.0–10.5)

## 2014-10-01 LAB — LACTIC ACID, PLASMA: Lactic Acid, Venous: 1.3 mmol/L (ref 0.5–2.0)

## 2014-10-01 LAB — C-REACTIVE PROTEIN: CRP: 11.7 mg/dL — ABNORMAL HIGH (ref <0.60)

## 2014-10-01 LAB — BASIC METABOLIC PANEL
Anion gap: 9 (ref 5–15)
BUN: 14 mg/dL (ref 6–23)
CO2: 29 mmol/L (ref 19–32)
Calcium: 8.9 mg/dL (ref 8.4–10.5)
Chloride: 94 mmol/L — ABNORMAL LOW (ref 96–112)
Creatinine, Ser: 1.71 mg/dL — ABNORMAL HIGH (ref 0.50–1.35)
GFR calc Af Amer: 57 mL/min — ABNORMAL LOW (ref >90)
GFR, EST NON AFRICAN AMERICAN: 49 mL/min — AB (ref >90)
GLUCOSE: 294 mg/dL — AB (ref 70–99)
Potassium: 4.1 mmol/L (ref 3.5–5.1)
Sodium: 132 mmol/L — ABNORMAL LOW (ref 135–145)

## 2014-10-01 LAB — I-STAT CG4 LACTIC ACID, ED: Lactic Acid, Venous: 1.77 mmol/L (ref 0.5–2.0)

## 2014-10-01 LAB — PROTIME-INR
INR: 1.11 (ref 0.00–1.49)
PROTHROMBIN TIME: 14.4 s (ref 11.6–15.2)

## 2014-10-01 LAB — TYPE AND SCREEN
ABO/RH(D): A POS
Antibody Screen: NEGATIVE

## 2014-10-01 LAB — APTT: aPTT: 34 seconds (ref 24–37)

## 2014-10-01 LAB — SEDIMENTATION RATE

## 2014-10-01 MED ORDER — LISINOPRIL 10 MG PO TABS
10.0000 mg | ORAL_TABLET | Freq: Every day | ORAL | Status: DC
  Administered 2014-10-01 – 2014-10-02 (×2): 10 mg via ORAL
  Filled 2014-10-01 (×4): qty 1

## 2014-10-01 MED ORDER — MORPHINE SULFATE 4 MG/ML IJ SOLN
4.0000 mg | INTRAMUSCULAR | Status: DC | PRN
  Administered 2014-10-01 (×2): 4 mg via INTRAVENOUS
  Filled 2014-10-01 (×2): qty 1

## 2014-10-01 MED ORDER — PIPERACILLIN-TAZOBACTAM 3.375 G IVPB
3.3750 g | Freq: Three times a day (TID) | INTRAVENOUS | Status: DC
  Filled 2014-10-01 (×2): qty 50

## 2014-10-01 MED ORDER — MORPHINE SULFATE 2 MG/ML IJ SOLN
2.0000 mg | INTRAMUSCULAR | Status: AC | PRN
  Administered 2014-10-02: 2 mg via INTRAVENOUS
  Filled 2014-10-01: qty 1

## 2014-10-01 MED ORDER — ACETAMINOPHEN 325 MG PO TABS
650.0000 mg | ORAL_TABLET | Freq: Four times a day (QID) | ORAL | Status: DC | PRN
  Administered 2014-10-02: 650 mg via ORAL
  Filled 2014-10-01: qty 2

## 2014-10-01 MED ORDER — INSULIN ASPART 100 UNIT/ML ~~LOC~~ SOLN: 0.0000 [IU] | Freq: Three times a day (TID) | SUBCUTANEOUS | Status: DC

## 2014-10-01 MED ORDER — PANTOPRAZOLE SODIUM 40 MG PO TBEC
40.0000 mg | DELAYED_RELEASE_TABLET | Freq: Two times a day (BID) | ORAL | Status: DC
  Administered 2014-10-01 – 2014-10-04 (×6): 40 mg via ORAL
  Filled 2014-10-01 (×5): qty 1

## 2014-10-01 MED ORDER — ONDANSETRON HCL 4 MG/2ML IJ SOLN
4.0000 mg | Freq: Once | INTRAMUSCULAR | Status: AC
  Administered 2014-10-01: 4 mg via INTRAVENOUS
  Filled 2014-10-01: qty 2

## 2014-10-01 MED ORDER — SODIUM CHLORIDE 0.9 % IV BOLUS (SEPSIS)
1000.0000 mL | Freq: Once | INTRAVENOUS | Status: AC
  Administered 2014-10-01: 1000 mL via INTRAVENOUS

## 2014-10-01 MED ORDER — GABAPENTIN 100 MG PO CAPS
200.0000 mg | ORAL_CAPSULE | Freq: Three times a day (TID) | ORAL | Status: DC
  Administered 2014-10-01 – 2014-10-05 (×10): 200 mg via ORAL
  Filled 2014-10-01 (×13): qty 2

## 2014-10-01 MED ORDER — HYDROCODONE-ACETAMINOPHEN 5-325 MG PO TABS
1.0000 | ORAL_TABLET | Freq: Four times a day (QID) | ORAL | Status: DC | PRN
  Filled 2014-10-01: qty 1

## 2014-10-01 MED ORDER — ALUM & MAG HYDROXIDE-SIMETH 200-200-20 MG/5ML PO SUSP: 30.0000 mL | Freq: Four times a day (QID) | ORAL | Status: DC | PRN

## 2014-10-01 MED ORDER — VANCOMYCIN HCL IN DEXTROSE 750-5 MG/150ML-% IV SOLN
750.0000 mg | Freq: Two times a day (BID) | INTRAVENOUS | Status: DC
  Administered 2014-10-02 – 2014-10-05 (×6): 750 mg via INTRAVENOUS
  Filled 2014-10-01 (×8): qty 150

## 2014-10-01 MED ORDER — NICOTINE 21 MG/24HR TD PT24
21.0000 mg | MEDICATED_PATCH | Freq: Every day | TRANSDERMAL | Status: DC
  Filled 2014-10-01 (×5): qty 1

## 2014-10-01 MED ORDER — HEPARIN SODIUM (PORCINE) 5000 UNIT/ML IJ SOLN
5000.0000 [IU] | Freq: Three times a day (TID) | INTRAMUSCULAR | Status: DC
  Administered 2014-10-01 – 2014-10-05 (×9): 5000 [IU] via SUBCUTANEOUS
  Filled 2014-10-01 (×12): qty 1

## 2014-10-01 MED ORDER — SODIUM CHLORIDE 0.9 % IV SOLN
INTRAVENOUS | Status: DC
  Administered 2014-10-01 – 2014-10-02 (×2): via INTRAVENOUS

## 2014-10-01 MED ORDER — VANCOMYCIN HCL 10 G IV SOLR
2000.0000 mg | Freq: Once | INTRAVENOUS | Status: AC
  Administered 2014-10-01: 2000 mg via INTRAVENOUS
  Filled 2014-10-01: qty 2000

## 2014-10-01 MED ORDER — PIPERACILLIN-TAZOBACTAM 3.375 G IVPB
3.3750 g | Freq: Three times a day (TID) | INTRAVENOUS | Status: DC
  Administered 2014-10-02 – 2014-10-05 (×10): 3.375 g via INTRAVENOUS
  Filled 2014-10-01 (×12): qty 50

## 2014-10-01 MED ORDER — ONDANSETRON HCL 4 MG/2ML IJ SOLN
4.0000 mg | Freq: Three times a day (TID) | INTRAMUSCULAR | Status: DC | PRN
  Administered 2014-10-03: 4 mg via INTRAVENOUS
  Filled 2014-10-01: qty 2

## 2014-10-01 MED ORDER — PIPERACILLIN-TAZOBACTAM 3.375 G IVPB 30 MIN
3.3750 g | Freq: Once | INTRAVENOUS | Status: AC
  Administered 2014-10-01: 3.375 g via INTRAVENOUS
  Filled 2014-10-01: qty 50

## 2014-10-01 NOTE — ED Provider Notes (Signed)
CSN: [id number]     Arrival date & time 10/01/14  1330 History   First MD Initiated Contact with Patient 10/01/14 1611     Chief Complaint  Patient presents with  . Foot Pain     HPI  Vision presents for evaluation of drainage from his right foot, small toe. Just discharged yesterday after 4 days in the hospital. Admitted with fever and GI complaints. Workup including CTs and blood cultures were negative. States he started with some clear drainage from the toe yesterday upon discharge. It became "brown" today. He is concerned because he states his previous amputations and started with infections with drainage.  Had prior amputation of his first, second toe of the right foot with Dr. Sharol Given in 2013.  Past Medical History  Diagnosis Date  . Hypertension   . Diabetes mellitus     type 2 iddm x 18 yrs  . Vascular disease     poor circulation to left foot  . Acute osteomyelitis, ankle and foot 07/30/2010    Qualifier: Diagnosis of  By: Tommy Medal MD, Roderic Scarce    . Anemia   . Renal insufficiency   . Hiatal hernia   . DDD (degenerative disc disease), lumbar   . Pancreatitis   . Gastroparesis   . Collagen vascular disease    Past Surgical History  Procedure Laterality Date  . Cholecystectomy    . Toe amputation  2012    left foot; great toe and second toe  . Amputation  04/25/2011    Procedure: AMPUTATION DIGIT;  Surgeon: Newt Minion, MD;  Location: [place];  Service: Orthopedics;  Laterality: Left;  Left foot 3rd toe amputation MTP joint, Gastroc Recession  Achilles Lengthening   . Amputation Bilateral 03/25/2013    Procedure: AMPUTATION RAY;  Surgeon: Newt Minion, MD;  Location: [place];  Service: Orthopedics;  Laterality: Bilateral;  Left Great Toe Amputation at  MTP Joint, Right 1st and 2nd Ray Amputation    Family History  Problem Relation Age of Onset  . Heart attack Father 75  . Hypertension Sister    History  Substance Use Topics  . Smoking status: Current Every Day Smoker  -- 0.10 packs/day for 3 years    Types: Cigarettes  . Smokeless tobacco: Never Used  . Alcohol Use: No    Review of Systems  Constitutional: Negative for fever, chills, diaphoresis, appetite change and fatigue.  HENT: Negative for mouth sores, sore throat and trouble swallowing.   Eyes: Negative for visual disturbance.  Respiratory: Negative for cough, chest tightness, shortness of breath and wheezing.   Cardiovascular: Negative for chest pain.  Gastrointestinal: Negative for nausea, vomiting, abdominal pain, diarrhea and abdominal distention.  Endocrine: Negative for polydipsia, polyphagia and polyuria.  Genitourinary: Negative for dysuria, frequency and hematuria.  Musculoskeletal: Negative for gait problem.       Right foot pain. Soft tissue swelling to the bilateral lower extremities show the knees down. States this has not changed from his recent admission.  Skin: Positive for wound. Negative for color change, pallor and rash.       Drainage right foot, fifth toe.  Neurological: Negative for dizziness, syncope, light-headedness and headaches.  Hematological: Does not bruise/bleed easily.  Psychiatric/Behavioral: Negative for behavioral problems and confusion.      Allergies  Review of patient's allergies indicates no known allergies.  Home Medications   Prior to Admission medications   Medication Sig Start Date End Date Taking? Authorizing Provider  baclofen (LIORESAL)  10 MG tablet Take 1 tablet (10 mg total) by mouth 3 (three) times daily as needed for muscle spasms. 09/30/14   Barton Dubois, MD  blood glucose meter kit and supplies KIT Dispense based on patient and insurance preference. Use to check blood sugar two times daily as directed. (FOR ICD-9 250.00, 250.01). 09/30/14   Barton Dubois, MD  gabapentin (NEURONTIN) 100 MG capsule Take 2 capsules (200 mg total) by mouth 3 (three) times daily. 05/30/14   Albertine Patricia, MD  HYDROcodone-acetaminophen (NORCO/VICODIN)  5-325 MG per tablet Take 1 tablet by mouth every 6 (six) hours as needed for severe pain. 09/30/14   Barton Dubois, MD  Insulin Isophane & Regular Human (HUMULIN 70/30 PEN) (70-30) 100 UNIT/ML PEN Inject 12 Units into the skin 2 (two) times daily. 09/30/14   Barton Dubois, MD  Insulin Pen Needle 31G X 5 MM MISC Use one needle to inject insulin with flexpen as indicated twice a day 09/30/14   Barton Dubois, MD  lisinopril (PRINIVIL,ZESTRIL) 20 MG tablet Take 1 tablet (20 mg total) by mouth daily. 09/30/14   Barton Dubois, MD  ondansetron (ZOFRAN ODT) 4 MG disintegrating tablet Take 1 tablet (4 mg total) by mouth every 8 (eight) hours as needed for nausea or vomiting. 09/30/14   Barton Dubois, MD  pantoprazole (PROTONIX) 40 MG tablet Take 1 tablet (40 mg total) by mouth 2 (two) times daily. 09/30/14   Barton Dubois, MD   BP 146/80 mmHg  Pulse 78  Temp(Src) 97.7 F (36.5 C) (Oral)  Resp 18  Ht _0  (1.854 m)  Wt 261 lb 12.8 oz (118.752 kg)  BMI 34.55 kg/m2  SpO2 98% Physical Exam  Constitutional: He is oriented to person, place, and time. He appears well-developed and well-nourished. No distress.  HENT:  Head: Normocephalic.  Eyes: Conjunctivae are normal. Pupils are equal, round, and reactive to light. No scleral icterus.  Neck: Normal range of motion. Neck supple. No thyromegaly present.  Cardiovascular: Normal rate and regular rhythm.  Exam reveals no gallop and no friction rub.   No murmur heard. Pulmonary/Chest: Effort normal and breath sounds normal. No respiratory distress. He has no wheezes. He has no rales.  Abdominal: Soft. Bowel sounds are normal. He exhibits no distension. There is no tenderness. There is no rebound.  Musculoskeletal: Normal range of motion.       Feet:  Neurological: He is alert and oriented to person, place, and time.  Skin: Skin is warm and dry. No rash noted.  Psychiatric: He has a normal mood and affect. His behavior is normal.    ED Course  Procedures  (including critical care time) Labs Review Labs Reviewed  CULTURE, BLOOD (ROUTINE X 2)  CULTURE, BLOOD (ROUTINE X 2)  CBC WITH DIFFERENTIAL/PLATELET  SEDIMENTATION RATE  C-REACTIVE PROTEIN  BASIC METABOLIC PANEL  I-STAT CG4 LACTIC ACID, ED    Imaging Review No results found.   EKG Interpretation None      MDM   Final diagnoses:  Diabetic foot infection    X-ray shows subcutaneous gas and probable distal phalanx osteomyelitis. Care discussed with hospitalist. I discussed the case with orthopedics on-call for Dr. Sharol Given. Patient will be seen in consult tomorrow by Ortho. Rest for back mice and Zosyn be given tonight. Was admitted to [facility] tried hospitalist.    Tanna Furry, MD 10/04/14 [phone]

## 2014-10-01 NOTE — ED Notes (Signed)
Phlebotomy at bedside.

## 2014-10-01 NOTE — H&P (Signed)
[facility] History and Physical  Dewight Catino EKC:[id number] DOB: 1976-02-13 DOA: 10/01/2014  Referring physician: ED physician PCP: [name], MD  Specialists:   Chief Complaint: Right foot pain  HPI: [name] is a 39 y.o. male with past medical history poorly controlled diabetes (recent A1c 13.6), GERD, tobacco abuse, PVD, chronic kidney disease-stage III, history of pancreatitis, gastroparesis, history of collagen vascular disease, history of diabetic foot osteomyelitis (post status of 4 toe amputation), who presents with right foot pain.  Patient was recently hospitalized from 4/20 to 09/30/14 due to fever and generalized weakness, which was considered as viral infection or viral gastroenteritis. He also had acute on chronic renal injury, which was considered as renal failure. He was discharged and stable condition with improved renal function back to the baseline.   Today patient comes back due to right foot pain. He states that he started with some clear drainage from the right 5th toe yesterday. It became "brown" today. He is concerned because he had previous amputations in 4 toes.   ROS: currently patient denies fever, chills, fatigue, running nose, ear pain, headaches, cough, chest pain, SOB, abdominal pain, diarrhea, constipation, dysuria, urgency, frequency, hematuria, skin rashes. No unilateral weakness, numbness or tingling sensations. No vision change or hearing loss.  In ED, patient was found to have normal temperature, WBC 10.2, stable renal function. X-ray of right foot showed possible osteomyelitis in right 5th toe. Patient is admitted to inpatient for further evaluation and treatment. Orthopedic surgeon was consulted, will see pt morning.  Review of Systems: As presented in the history of presenting illness, rest negative.  Where does patient live?  At home Can patient participate in ADLs? Yes  Allergy: No Known Allergies  Past Medical History   Diagnosis Date  . Hypertension   . Diabetes mellitus     type 2 iddm x 18 yrs  . Vascular disease     poor circulation to left foot  . Acute osteomyelitis, ankle and foot 07/30/2010    Qualifier: Diagnosis of  By: Tommy Medal MD, Roderic Scarce    . Anemia   . Renal insufficiency   . Hiatal hernia   . DDD (degenerative disc disease), lumbar   . Pancreatitis   . Gastroparesis   . Collagen vascular disease   . GERD (gastroesophageal reflux disease)     Past Surgical History  Procedure Laterality Date  . Cholecystectomy    . Toe amputation  2012    left foot; great toe and second toe  . Amputation  04/25/2011    Procedure: AMPUTATION DIGIT;  Surgeon: Newt Minion, MD;  Location: [place];  Service: Orthopedics;  Laterality: Left;  Left foot 3rd toe amputation MTP joint, Gastroc Recession  Achilles Lengthening   . Amputation Bilateral 03/25/2013    Procedure: AMPUTATION RAY;  Surgeon: Newt Minion, MD;  Location: [place];  Service: Orthopedics;  Laterality: Bilateral;  Left Great Toe Amputation at  MTP Joint, Right 1st and 2nd Ray Amputation     Social History:  reports that he has been smoking Cigarettes.  He has a .3 pack-year smoking history. He has never used smokeless tobacco. He reports that he uses illicit drugs (Marijuana) about 10 times per week. He reports that he does not drink alcohol.  Family History:  Family History  Problem Relation Age of Onset  . Heart attack Father 40  . Hypertension Sister      Prior to Admission medications   Medication Sig Start Date End  Date Taking? Authorizing Provider  gabapentin (NEURONTIN) 100 MG capsule Take 2 capsules (200 mg total) by mouth 3 (three) times daily. 05/30/14  Yes Silver Huguenin Elgergawy, MD  lisinopril (PRINIVIL,ZESTRIL) 10 MG tablet Take 10 mg by mouth daily.   Yes Historical Provider, MD  baclofen (LIORESAL) 10 MG tablet Take 1 tablet (10 mg total) by mouth 3 (three) times daily as needed for muscle spasms. Patient not taking:  Reported on 10/01/2014 09/30/14   Barton Dubois, MD  blood glucose meter kit and supplies KIT Dispense based on patient and insurance preference. Use to check blood sugar two times daily as directed. (FOR ICD-9 250.00, 250.01). Patient not taking: Reported on 10/01/2014 09/30/14   Barton Dubois, MD  HYDROcodone-acetaminophen (NORCO/VICODIN) 5-325 MG per tablet Take 1 tablet by mouth every 6 (six) hours as needed for severe pain. Patient not taking: Reported on 10/01/2014 09/30/14   Barton Dubois, MD  Insulin Isophane & Regular Human (HUMULIN 70/30 PEN) (70-30) 100 UNIT/ML PEN Inject 12 Units into the skin 2 (two) times daily. Patient not taking: Reported on 10/01/2014 09/30/14   Barton Dubois, MD  Insulin Pen Needle 31G X 5 MM MISC Use one needle to inject insulin with flexpen as indicated twice a day Patient not taking: Reported on 10/01/2014 09/30/14   Barton Dubois, MD  lisinopril (PRINIVIL,ZESTRIL) 20 MG tablet Take 1 tablet (20 mg total) by mouth daily. Patient not taking: Reported on 10/01/2014 09/30/14   Barton Dubois, MD  ondansetron (ZOFRAN ODT) 4 MG disintegrating tablet Take 1 tablet (4 mg total) by mouth every 8 (eight) hours as needed for nausea or vomiting. Patient not taking: Reported on 10/01/2014 09/30/14   Barton Dubois, MD  pantoprazole (PROTONIX) 40 MG tablet Take 1 tablet (40 mg total) by mouth 2 (two) times daily. Patient not taking: Reported on 10/01/2014 09/30/14   Barton Dubois, MD    Physical Exam: Filed Vitals:   10/01/14 1736 10/01/14 1830 10/01/14 1900 10/01/14 1930  BP: 150/85 129/90 134/87 132/81  Pulse: 73 70 71 72  Temp:      TempSrc:      Resp: 18     Height:      Weight:      SpO2: 95% 98% 96% 94%   General: Not in acute distress HEENT:       Eyes: PERRL, EOMI, no scleral icterus       ENT: No discharge from the ears and nose, no pharynx injection, no tonsillar enlargement.        Neck: No JVD, no bruit, no mass felt. Cardiac: S1/S2, RRR, No murmurs, No gallops or  rubs Pulm: Good air movement bilaterally. Clear to auscultation bilaterally. No rales, wheezing, rhonchi or rubs. Abd: Soft, nondistended, nontender, no rebound pain, no organomegaly, BS present Ext: First and second toes are surgically absent in the right foot. There is erythema, swelling and tenderness in right foot. There are some purulent drainage from the softer tissue of medial right fifth toe. 1+DP/PT pulse bilaterally Musculoskeletal: No joint deformities, erythema, or stiffness, ROM full Skin: No rashes.  Neuro: Alert and oriented X3, cranial nerves II-XII grossly intact, muscle strength 5/5 in all extremeties, sensation to light touch intact.  Psych: Patient is not psychotic, no suicidal or hemocidal ideation.  Labs on Admission:  Basic Metabolic Panel:  Recent Labs Lab 09/27/14 1515 09/28/14 0630 09/29/14 0655 09/30/14 0655 10/01/14 1800  NA 129* 136 135 143 132*  K 4.3 4.3 4.2 4.1 4.1  CL 93* 102  100 104 94*  CO2 27 23 27 26 29  GLUCOSE 426* 291* 222* 94 294*  BUN 28* 24* 16 22 14  CREATININE 2.37* 1.82* 1.57* 1.03 1.71*  CALCIUM 8.6 8.8 8.8 9.4 8.9   Liver Function Tests:  Recent Labs Lab 09/27/14 1515  AST 23  ALT 44  ALKPHOS 105  BILITOT 0.8  PROT 7.7  ALBUMIN 3.2*    Recent Labs Lab 09/27/14 1541  LIPASE 22   No results for input(s): AMMONIA in the last 168 hours. CBC:  Recent Labs Lab 09/27/14 1515 09/28/14 0630 10/01/14 1800  WBC 12.2* 11.6* 10.2  NEUTROABS 9.6*  --  7.0  HGB 12.0* 11.0* 10.1*  HCT 34.3* 32.7* 30.3*  MCV 88.2 90.6 89.9  PLT 184 168 269   Cardiac Enzymes: No results for input(s): CKTOTAL, CKMB, CKMBINDEX, TROPONINI in the last 168 hours.  BNP (last 3 results)  Recent Labs  05/30/14 0041  BNP 19.2    ProBNP (last 3 results) No results for input(s): PROBNP in the last 8760 hours.  CBG:  Recent Labs Lab 09/29/14 1626 09/29/14 2143 09/30/14 0734 09/30/14 1146 09/30/14 1712  GLUCAP 222* 215* 212* 221*  171*    Radiological Exams on Admission: Dg Foot 2 Views Right  10/01/2014   CLINICAL DATA:  Diabetic foot infection.  Drainage from fifth toe  EXAM: RIGHT FOOT - 2 VIEW  COMPARISON:  11/14/2013  FINDINGS: Amputation first toe at the level of the metatarsal phalangeal joint. Amputation of the second toe at the level of the distal second metatarsal  Chronic septic arthritis involving the third metatarsal phalangeal joint and interphalangeal joint. Bony proliferative changes and sclerotic change have progressed in the interval.  Soft tissue swelling of the fifth toe. Gas in the soft tissues. Lucency in the distal fifth phalanx suggestive of osteomyelitis.  IMPRESSION: Soft tissue swelling and soft tissue gas in the fifth toe with probable osteomyelitis distal fifth phalanx  Chronic septic arthritis third metatarsal phalangeal joint and interphalangeal joint progressive bony changes since the prior study.   Electronically Signed   By: Charles  Clark [name]   On: 10/01/2014 17:50    EKG: Independently reviewed.  Abnormal findings:  Will get one.   Assessment/Plan Principal Problem:   Diabetic foot ulcer with osteomyelitis Active Problems:   DM (diabetes mellitus) type II uncontrolled, periph vascular disorder   CKD (chronic kidney disease), stage III   Essential hypertension, benign   Esophageal reflux   GERD (gastroesophageal reflux disease)   Right foot infection  Osteomyelitis in right 5th toe: Patient is not septic on admission. Hemodynamically stable. He has a moderate pain currently.  - Admit to regular floor  - Empiric antimicrobial treatment with vancomycin IV and zosyn  - IVF: 1 L normal saline bolus, followed by NS 125 cc/h  - Blood cultures x 2  - Symptomatic treatment: prn Tylenol for fever, prn zofran for nausea and Percocet for pain.  - EKG for monitoring QTc  - check  lactic acid x 1 - ESR and CRP - INR/PTT/Type and screen - follow up ortho recommendations  Diabetes  mellitus: A1c was 13.6 on 09/28/14. Very poorly controlled. Supposed to take insulin 70/30 mix, 12 units twice a day, but he has not been taken recently. -Humulin 70/30, 12 units twice a day -Sliding-scale insulin -Did counseling about the importance of medication compliance  Chronic kidney disease-3, baseline creatinine 1.5-2.0. His creatinine is 1.71 on admission, which is at baseline. -IV fluid as above -  Follow-up renal function by BMP  Hypertension: -Continue lisinopril  Tobacco abuse: -Did counseling about importance of quitting smoking -nicotine patch  Gerd: -Protonix  DVT ppx: SQ Heparin       Code Status: Full code Family Communication: None at bed side.     Disposition Plan: Admit to inpatient   Date of Service 10/01/2014    Ivor Costa [facility] Pager [phone]  If 7PM-7AM, please contact night-coverage [email or web address] Password [email or web address] 10/01/2014, 8:22 PM

## 2014-10-01 NOTE — ED Notes (Signed)
Patient returned from X-ray 

## 2014-10-01 NOTE — ED Notes (Signed)
Attempted to pull labs from IV.  Unable to pull back more than 4ml. Phlebotomy made aware.

## 2014-10-01 NOTE — Progress Notes (Signed)
25[name] 39 year old patient who had prior right foot surgery by Dr. Lajoyce Cornersuda 3 years ago he presents now with drainage and bone infection by x-ray he will require more surgery Dr. Lajoyce Cornersuda will consult on him in the morning

## 2014-10-01 NOTE — ED Notes (Signed)
Pt reports recent admission due to swelling of right foot. Reports being dc home yesterday, was having clear drainage from his foot, the drainage has now turned brown. No acute distress noted at triage.

## 2014-10-01 NOTE — Progress Notes (Signed)
ANTIBIOTIC CONSULT NOTE - INITIAL  Pharmacy Consult for vancomycin Indication: pneumonia + diabetic foot infection  No Known Allergies  Patient Measurements: Height: 6\' 1"  (185.4 cm) Weight: 261 lb 12.8 oz (118.752 kg) IBW/kg (Calculated) : 79.9  Vital Signs: Temp: 98.8 F (37.1 C) (04/24 2053) Temp Source: Oral (04/24 2053) BP: 132/82 mmHg (04/24 2053) Pulse Rate: 77 (04/24 2053) Intake/Output from previous day:   Intake/Output from this shift:    Labs:  Recent Labs  09/29/14 0655 09/30/14 0655 10/01/14 1800  WBC  --   --  10.2  HGB  --   --  10.1*  PLT  --   --  269  CREATININE 1.57* 1.03 1.71*   Estimated Creatinine Clearance: 79.9 mL/min (by C-G formula based on Cr of 1.71). No results for input(s): VANCOTROUGH, VANCOPEAK, VANCORANDOM, GENTTROUGH, GENTPEAK, GENTRANDOM, TOBRATROUGH, TOBRAPEAK, TOBRARND, AMIKACINPEAK, AMIKACINTROU, AMIKACIN in the last 72 hours.   Microbiology: Recent Results (from the past 720 hour(s))  Culture, blood (routine x 2)     Status: None (Preliminary result)   Collection Time: 09/27/14  3:05 PM  Result Value Ref Range Status   Specimen Description BLOOD ARM RIGHT  Final   Special Requests BOTTLES DRAWN AEROBIC AND ANAEROBIC 10CC  Final   Culture   Final           BLOOD CULTURE RECEIVED NO GROWTH TO DATE CULTURE WILL BE HELD FOR 5 DAYS BEFORE ISSUING A FINAL NEGATIVE REPORT [facility]formed [facility]    Report Status PENDING  Incomplete  Culture, blood (routine x 2)     Status: None (Preliminary result)   Collection Time: 09/27/14  3:09 PM  Result Value Ref Range Status   Specimen Description BLOOD RIGHT HAND  Final   Special Requests BOTTLES DRAWN AEROBIC AND ANAEROBIC 5CC  Final   Culture   Final           BLOOD CULTURE RECEIVED NO GROWTH TO DATE CULTURE WILL BE HELD FOR 5 DAYS BEFORE ISSUING A FINAL NEGATIVE REPORT Performed at [facility]    Report Status PENDING  Incomplete  Urine culture     Status: None    Collection Time: 09/27/14  6:44 PM  Result Value Ref Range Status   Specimen Description URINE, RANDOM  Final   Special Requests NONE  Final   Colony Count NO GROWTH Performed at [facility]   Final   Culture NO GROWTH Performed at [facility]   Final   Report Status 09/29/2014 FINAL  Final    Medical History: Past Medical History  Diagnosis Date  . Hypertension   . Diabetes mellitus     type 2 iddm x 18 yrs  . Vascular disease     poor circulation to left foot  . Acute osteomyelitis, ankle and foot 07/30/2010    Qualifier: Diagnosis of  By: Daiva EvesVan Dam MD, Remi Haggardornelius    . Anemia   . Renal insufficiency   . Hiatal hernia   . DDD (degenerative disc disease), lumbar   . Pancreatitis   . Gastroparesis   . Collagen vascular disease   . GERD (gastroesophageal reflux disease)     Assessment: [age] YOM with hx of uncontrolled DM readmitted today for R foot pain. Pharmacy is consulted to start vancomycin/zosyn empirically for osteomyelitis. Recent hospital stay (4/20-4/23) for fever and generalized weakness, didn't receive vancomycin. Pt with CKD, scr 1.71 today, est. crcl ~ 75-80 ml/min. Afebrile, wbc 10.2 k  Goal of Therapy:  Vancomycin trough level 15-20 mcg/ml  Plan:  - Vancomycin 2g loading dose then 750 mg Q 12 hrs - monitor renal function and f/u cultures if available - vancomycin trough at steady state. - zosyn 3.375g IV Q 8 hrs  Bayard Hugger, PharmD, BCPS  Clinical Pharmacist  Pager: [phone]   10/01/2014,9:37 PM

## 2014-10-01 NOTE — ED Notes (Signed)
Admitting at bedside 

## 2014-10-01 NOTE — Progress Notes (Signed)
10/01/2014 10:17 PM  New Admission Note:   Arrival Method: Via stretcher from the ED from [facility] who could not accept the patient  Mental Orientation: Alert and oriented X4  Telemetry: Placed on box 6E27 per MD orders  Assessment: Completed Skin: Leg left non pitting edema with old toe amputions (dry and healed). Right leg +2 pitting edema with drainage and open wound between 4th and 5th toes. Then great toe and 2nd toe old amputation (dry and healed). Applied 2x2s and wrapped foot with guaze. Kim RN second witness.  IV: Clean, dry and intact. Normal saline running at 16325mL/hr. IV ABT running as well  Pain: No pain stated at this time. Patient does make a grimace face when touching his right foot toes  Tubes: N/A Safety Measures: Safety Fall Prevention Plan has been given, discussed and signed Admission: Completed [facility] Orientation: Patient has been orientated to the room, unit and staff.  Family: Significant other at the bedside   Orders have been reviewed and implemented. Will continue to monitor the patient. Call light has been placed within reach and bed alarm has been activated.   PACCAR Incyanne Hill BSN, RN-BC, RN3 [facility] Phone number: [phone]

## 2014-10-02 DIAGNOSIS — M86171 Other acute osteomyelitis, right ankle and foot: Secondary | ICD-10-CM

## 2014-10-02 LAB — CBC
HCT: 30.5 % — ABNORMAL LOW (ref 39.0–52.0)
HEMOGLOBIN: 10 g/dL — AB (ref 13.0–17.0)
MCH: 29.7 pg (ref 26.0–34.0)
MCHC: 32.8 g/dL (ref 30.0–36.0)
MCV: 90.5 fL (ref 78.0–100.0)
Platelets: 251 10*3/uL (ref 150–400)
RBC: 3.37 MIL/uL — ABNORMAL LOW (ref 4.22–5.81)
RDW: 13.1 % (ref 11.5–15.5)
WBC: 9.9 10*3/uL (ref 4.0–10.5)

## 2014-10-02 LAB — COMPREHENSIVE METABOLIC PANEL
ALBUMIN: 2.2 g/dL — AB (ref 3.5–5.2)
ALT: 25 U/L (ref 0–53)
ANION GAP: 9 (ref 5–15)
AST: 25 U/L (ref 0–37)
Alkaline Phosphatase: 72 U/L (ref 39–117)
BUN: 14 mg/dL (ref 6–23)
CO2: 26 mmol/L (ref 19–32)
Calcium: 8.4 mg/dL (ref 8.4–10.5)
Chloride: 98 mmol/L (ref 96–112)
Creatinine, Ser: 1.61 mg/dL — ABNORMAL HIGH (ref 0.50–1.35)
GFR calc Af Amer: 62 mL/min — ABNORMAL LOW (ref >90)
GFR calc non Af Amer: 53 mL/min — ABNORMAL LOW (ref >90)
GLUCOSE: 329 mg/dL — AB (ref 70–99)
POTASSIUM: 4.2 mmol/L (ref 3.5–5.1)
SODIUM: 133 mmol/L — AB (ref 135–145)
TOTAL PROTEIN: 6.6 g/dL (ref 6.0–8.3)
Total Bilirubin: 0.3 mg/dL (ref 0.3–1.2)

## 2014-10-02 LAB — GLUCOSE, CAPILLARY
GLUCOSE-CAPILLARY: 282 mg/dL — AB (ref 70–99)
GLUCOSE-CAPILLARY: 203 mg/dL — AB (ref 70–99)
GLUCOSE-CAPILLARY: 180 mg/dL — AB (ref 70–99)
Glucose-Capillary: 276 mg/dL — ABNORMAL HIGH (ref 70–99)
Glucose-Capillary: 141 mg/dL — ABNORMAL HIGH (ref 70–99)

## 2014-10-02 MED ORDER — INSULIN NPH (HUMAN) (ISOPHANE) 100 UNIT/ML ~~LOC~~ SUSP
10.0000 [IU] | Freq: Once | SUBCUTANEOUS | Status: AC
  Administered 2014-10-02: 10 [IU] via SUBCUTANEOUS
  Filled 2014-10-02 (×2): qty 10

## 2014-10-02 MED ORDER — INSULIN ASPART 100 UNIT/ML ~~LOC~~ SOLN
0.0000 [IU] | Freq: Three times a day (TID) | SUBCUTANEOUS | Status: DC
  Administered 2014-10-02: 2 [IU] via SUBCUTANEOUS
  Administered 2014-10-02 – 2014-10-04 (×3): 3 [IU] via SUBCUTANEOUS
  Administered 2014-10-04 – 2014-10-05 (×2): 2 [IU] via SUBCUTANEOUS

## 2014-10-02 MED ORDER — HYDROCODONE-ACETAMINOPHEN 5-325 MG PO TABS
1.0000 | ORAL_TABLET | Freq: Four times a day (QID) | ORAL | Status: DC | PRN
  Administered 2014-10-02 – 2014-10-04 (×6): 2 via ORAL
  Filled 2014-10-02: qty 1
  Filled 2014-10-02 (×5): qty 2

## 2014-10-02 MED ORDER — INSULIN ASPART 100 UNIT/ML ~~LOC~~ SOLN
3.0000 [IU] | Freq: Three times a day (TID) | SUBCUTANEOUS | Status: DC
  Administered 2014-10-02 – 2014-10-05 (×6): 3 [IU] via SUBCUTANEOUS

## 2014-10-02 MED ORDER — INSULIN ASPART 100 UNIT/ML ~~LOC~~ SOLN: 0.0000 [IU] | Freq: Every day | SUBCUTANEOUS | Status: DC

## 2014-10-02 MED ORDER — PRO-STAT SUGAR FREE PO LIQD
30.0000 mL | Freq: Two times a day (BID) | ORAL | Status: DC
  Administered 2014-10-02 – 2014-10-05 (×5): 30 mL via ORAL
  Filled 2014-10-02 (×7): qty 30

## 2014-10-02 MED ORDER — INSULIN ASPART PROT & ASPART (70-30 MIX) 100 UNIT/ML ~~LOC~~ SUSP
12.0000 [IU] | Freq: Two times a day (BID) | SUBCUTANEOUS | Status: DC
  Filled 2014-10-02: qty 10

## 2014-10-02 MED ORDER — INSULIN ISOPHANE & REGULAR (HUMAN 70-30)100 UNIT/ML KWIKPEN: 12.0000 [IU] | PEN_INJECTOR | Freq: Two times a day (BID) | SUBCUTANEOUS | Status: DC

## 2014-10-02 MED ORDER — INSULIN DETEMIR 100 UNIT/ML ~~LOC~~ SOLN
16.0000 [IU] | Freq: Every day | SUBCUTANEOUS | Status: DC
  Administered 2014-10-02 – 2014-10-04 (×3): 16 [IU] via SUBCUTANEOUS
  Filled 2014-10-02 (×4): qty 0.16

## 2014-10-02 NOTE — Progress Notes (Signed)
PT Cancellation Note  Patient Details Name: [name] MRN: [id number] DOB: 12/29/1975   Cancelled Treatment:    Reason Eval/Treat Not Completed: Medical issues which prohibited therapy   Noted from brief Ortho note that he will need surgery; Await Dr. Audrie Liauda's formal consult;   Will hold PT eval at this time and return either post-op, or as a more definitive Plan of Care is established;  Thanks,  Van ClinesHolly Jarika Robben, PT  Acute Rehabilitation Services Pager [phone] Office [phone]    Van ClinesGarrigan, Estha Few [facility] 10/02/2014, 8:28 AM

## 2014-10-02 NOTE — Progress Notes (Addendum)
Inpatient Diabetes Program Recommendations  AACE/ADA: New Consensus Statement on Inpatient Glycemic Control (2013)  Target Ranges:  Prepandial:   less than 140 mg/dL      Peak postprandial:   less than 180 mg/dL (1-2 hours)      Critically ill patients:  140 - 180 mg/dL   Results for [name], [name] (MRN [id number]) as of 10/02/2014 10:33  Ref. Range 10/01/2014 22:04 10/02/2014 07:42  Glucose-Capillary Latest Ref Range: 70-99 mg/dL 272 (H) 536 (H)   Diabetes history: DM2 Outpatient Diabetes medications: 70/30 12 units BID Current orders for Inpatient glycemic control: 70/30 12 units BID, Novolog 0-9 units TID with meals  Inpatient Diabetes Program Recommendations Insulin - Basal: Currently patient is ordered 70/30 12 units BID. However since patient is NPO at this time 70/30 is not being given. If patient will continue to be NPO, please consider discontinuing 70/30 and order Lantus 10 units Q24H starting now. Correction (SSI): If patient will continue to be NPO, please change frequency of CBGs and Novolog correction to Q4H. NURSING: Please administer correction insulin as ordered despite being NPO as correction insulin is intended to get glucose back in target ranges and should be given even if NPO.  10/02/14@13 :50-Spoke with patient about diabetes and home regimen for diabetes control. Patient reports that he is followed by his PCP for diabetes management. Patient was recently hospitalized from 09/27/14 to 09/30/14 and was taking Metformin as an outpatient but when he was discharged from the hospital on 09/30/14 he was prescribed 70/30 12 units BID. Inquired about whether he was taking 70/30 insulin as prescribed and patient reports that he had not started on 70/30 insulin as an outpatient since he never had it filled yet. Patient reports that he was educated on insulin injections during his last hospitalization and that he was able to give himself insulin injections during that time as well. Talked  with patient about importance of insulin to improve diabetes control. Inquired about knowledge about A1C and patient reports that he knows what an A1C is and was told that his A1C was 13.6% during his last hospital admission a few days ago. Discussed A1C results and explained how the A1C equates to actual glucose values around 350 mg/dl. Reviewed glucose and A1C goals. Discussed importance of checking CBGs and maintaining good CBG control to prevent long-term and short-term complications. Discussed impact of nutrition, exercise, stress, sickness, and medications on diabetes control.  Patient states that he does not really follow a diabetic diet and admits to drinking sugary drinks. Discussed carbohydrates, carbohydrate goals per day and meal, along with portion sizes. Patient states that he is interested in getting more education on carb modified diet and would like to learn about changes he can make to improve diabetes and help him lose weight. Placed consult for RD to provide further education. Patient reports that he has everything at home to check his glucose and that he is willing to use insulin as an outpatient. Patient states that he prefers to use an insulin pen which he was educated on during last hospitalization. Encouraged patient to check his glucose 3-4 times per day and to keep a log of glucose readings which he needs to take with him to follow up visits with his doctor so additional adjustments can be made with DM medications. Patient verbalized understanding of information discussed and he states that he has no further questions at this time related to diabetes.   Thanks, Orlando Penner, RN, MSN, CCRN, CDE Diabetes Coordinator Inpatient  Diabetes Program [phone] (Team Pager from 8am to 5pm) [phone] (AP office) [phone] [facility] office)

## 2014-10-02 NOTE — Progress Notes (Signed)
[facility] PROGRESS NOTE  [name] TKZ:[id number] DOB: 06/07/1976 DOA: 10/01/2014 PCP: [name], MD  Brief Summary  [name] is a 39 y.o. male with past medical history poorly controlled diabetes (recent A1c 13.6), GERD, tobacco abuse, PVD, chronic kidney disease-stage III, history of pancreatitis, gastroparesis, history of collagen vascular disease, history of diabetic foot osteomyelitis (post status of 4 toe amputation), who presented with right foot pain.  He was hospitalized from 4/20 to 09/30/14 with fever and generalized weakness and was felt to have a viral infection or viral gastroenteritis. He also had acute on chronic renal injury with kidney function back to baseline at the time of discharge.  He presented with increasing right foot pain with clear drainage that turned brown.   X-ray of right foot showed possible osteomyelitis in right 5th toe.  ESR > 140.  He was started on vancomycin and zosyn.  Dr. Sharol Given to consult.    Assessment/Plan  Osteomyelitis in right 5th toe: Patient is not septic on admission. Hemodynamically stable. He has a moderate pain currently. - Empiric antimicrobial treatment with vancomycin IV and zosyn  - d/c IVF - Blood cultures x 2 pending - Continue prn Tylenol for fever, prn zofran QTc 430 for nausea and Percocet for pain.  - EKG for monitoring QTc  - ESR >140 and CRP 11.7 - Dr. Sharol Given states patient will not be going to OR today - resume diet  Diabetes mellitus type 2, uncontrolled with chronic kidney disease: A1c was 13.6 on 09/28/14. Very poorly controlled.  -  Supposed to take insulin 70/30 mix, 12 units twice a day, but he has not been taken recently. -  d/c humulin 70/30, 12 units twice a day = 24 units -  Start levemir 16 units qhs -  Give NPH 10 units once now -  Continue SSI -  Add aspart 3 units AC and HS insulin  Chronic kidney disease-3, baseline creatinine 1.5-2.0. His creatinine is 1.71 on admission, which is at  baseline.  Hypertension, BP elevated - Continue lisinopril  Tobacco abuse: -Did counseling about importance of quitting smoking -nicotine patch  Gerd:  Stable, continue protonix -  Consider changing to H2 blocker given CKD  Pseudohyponatremia from hyperglycemia  Diet:  diabetic Access:  PIV IVF:  off Proph:  heparin  Code Status: full Family Communication: patient and family at bedside Disposition Plan: pending probable surgery   Consultants:  Orthopedic surgery, Dr. Sharol Given  Procedures:  XR right foot  Antibiotics:  Vancomycin 4/24 >  Zosyn 4/24 >  HPI/Subjective:  Continues to have pain in the right foot.  Hungry.    Objective: Filed Vitals:   10/01/14 2150 10/01/14 2159 10/02/14 0503 10/02/14 0828  BP:  131/73 134/77 142/91  Pulse:  79 76 78  Temp:  99 F (37.2 C) 97.8 F (36.6 C) 98 F (36.7 C)  TempSrc:    Oral  Resp:  _0 Height: _1  (1.854 m)     Weight: 115.713 kg (255 lb 1.6 oz)     SpO2:  99% 98% 99%    Intake/Output Summary (Last 24 hours) at 10/02/14 1140 Last data filed at 10/02/14 5732  Gross per 24 hour  Intake 2552.5 ml  Output   1300 ml  Net 1252.5 ml   Filed Weights   10/01/14 1339 10/01/14 2150  Weight: 118.752 kg (261 lb 12.8 oz) 115.713 kg (255 lb 1.6 oz)    Exam:   General:  Obese male, No  acute distress  HEENT:  NCAT, MMM  Cardiovascular:  RRR, nl S1, S2 no mrg, 2+ pulses, warm extremities  Respiratory:  CTAB, no increased WOB  Abdomen:   NABS, soft, NT/ND  MSK:   Normal tone and bulk, 2+ pitting edema RLE, brown drainage from between the 4th and 5th toes, 5th toe very swollen.  Surgically amputated 1st and 2nd dose.  Warm to touch on foot  Neuro:  Grossly moves all extremities.    Data Reviewed: Basic Metabolic Panel:  Recent Labs Lab 09/28/14 0630 09/29/14 0655 09/30/14 0655 10/01/14 1800 10/02/14 0604  NA 136 135 143 132* 133*  K 4.3 4.2 4.1 4.1 4.2  CL 102 100 104 94* 98  CO2 _0 GLUCOSE 291* 222* 94 294* 329*  BUN 24* _1 CREATININE 1.82* 1.57* 1.03 1.71* 1.61*  CALCIUM 8.8 8.8 9.4 8.9 8.4   Liver Function Tests:  Recent Labs Lab 09/27/14 1515 10/02/14 0604  AST 23 25  ALT 44 25  ALKPHOS 105 72  BILITOT 0.8 0.3  PROT 7.7 6.6  ALBUMIN 3.2* 2.2*    Recent Labs Lab 09/27/14 1541  LIPASE 22   No results for input(s): AMMONIA in the last 168 hours. CBC:  Recent Labs Lab 09/27/14 1515 09/28/14 0630 10/01/14 1800 10/02/14 0604  WBC 12.2* 11.6* 10.2 9.9  NEUTROABS 9.6*  --  7.0  --   HGB 12.0* 11.0* 10.1* 10.0*  HCT 34.3* 32.7* 30.3* 30.5*  MCV 88.2 90.6 89.9 90.5  PLT 184 168 269 251   Cardiac Enzymes: No results for input(s): CKTOTAL, CKMB, CKMBINDEX, TROPONINI in the last 168 hours. BNP (last 3 results)  Recent Labs  05/30/14 0041  BNP 19.2    ProBNP (last 3 results) No results for input(s): PROBNP in the last 8760 hours.  CBG:  Recent Labs Lab 09/30/14 0734 09/30/14 1146 09/30/14 1712 10/01/14 2204 10/02/14 0742  GLUCAP 212* 221* 171* 282* 276*    Recent Results (from the past 240 hour(s))  Culture, blood (routine x 2)     Status: None (Preliminary result)   Collection Time: 09/27/14  3:05 PM  Result Value Ref Range Status   Specimen Description BLOOD ARM RIGHT  Final   Special Requests BOTTLES DRAWN AEROBIC AND ANAEROBIC 10CC  Final   Culture   Final           BLOOD CULTURE RECEIVED NO GROWTH TO DATE CULTURE WILL BE HELD FOR 5 DAYS BEFORE ISSUING A FINAL NEGATIVE REPORT Performed at [facility]    Report Status PENDING  Incomplete  Culture, blood (routine x 2)     Status: None (Preliminary result)   Collection Time: 09/27/14  3:09 PM  Result Value Ref Range Status   Specimen Description BLOOD RIGHT HAND  Final   Special Requests BOTTLES DRAWN AEROBIC AND ANAEROBIC 5CC  Final   Culture   Final           BLOOD CULTURE RECEIVED NO GROWTH TO DATE CULTURE WILL BE HELD FOR 5 DAYS BEFORE  ISSUING A FINAL NEGATIVE REPORT Performed at [facility]    Report Status PENDING  Incomplete  Urine culture     Status: None   Collection Time: 09/27/14  6:44 PM  Result Value Ref Range Status   Specimen Description URINE, RANDOM  Final   Special Requests NONE  Final   Colony Count NO GROWTH Performed at [facility]   Final  Culture NO GROWTH Performed at [facility]   Final   Report Status 09/29/2014 FINAL  Final     Studies: Dg Foot 2 Views Right  10/01/2014   CLINICAL DATA:  Diabetic foot infection.  Drainage from fifth toe  EXAM: RIGHT FOOT - 2 VIEW  COMPARISON:  11/14/2013  FINDINGS: Amputation first toe at the level of the metatarsal phalangeal joint. Amputation of the second toe at the level of the distal second metatarsal  Chronic septic arthritis involving the third metatarsal phalangeal joint and interphalangeal joint. Bony proliferative changes and sclerotic change have progressed in the interval.  Soft tissue swelling of the fifth toe. Gas in the soft tissues. Lucency in the distal fifth phalanx suggestive of osteomyelitis.  IMPRESSION: Soft tissue swelling and soft tissue gas in the fifth toe with probable osteomyelitis distal fifth phalanx  Chronic septic arthritis third metatarsal phalangeal joint and interphalangeal joint progressive bony changes since the prior study.   Electronically Signed   By: Franchot Gallo [name]   On: 10/01/2014 17:50    Scheduled Meds: . gabapentin  200 mg Oral TID  . heparin  5,000 Units Subcutaneous 3 times per day  . insulin aspart  0-9 Units Subcutaneous TID WC  . insulin aspart protamine- aspart  12 Units Subcutaneous BID WC  . lisinopril  10 mg Oral Daily  . nicotine  21 mg Transdermal Daily  . pantoprazole  40 mg Oral BID  . piperacillin-tazobactam (ZOSYN)  IV  3.375 g Intravenous 3 times per day  . vancomycin  750 mg Intravenous Q12H   Continuous Infusions: . sodium chloride 125 mL/hr at 10/02/14 0510     Principal Problem:   Diabetic foot ulcer with osteomyelitis Active Problems:   DM (diabetes mellitus) type II uncontrolled, periph vascular disorder   CKD (chronic kidney disease), stage III   Essential hypertension, benign   Esophageal reflux   GERD (gastroesophageal reflux disease)   Right foot infection   Osteomyelitis of foot, right, acute   Diabetic foot ulcer    Time spent: 30 min    Lynsay Fesperman, [place] [facility] Pager [phone]. If 7PM-7AM, please contact night-coverage at [email or web address], password [facility] 10/02/2014, 11:40 AM  LOS: 1 day

## 2014-10-02 NOTE — Consult Note (Signed)
Reason for Consult: Abscess osteomyelitis fifth toe right foot with chronic osteomyelitis third MTP joint status post first and second Ray amputations Referring Physician: Hospitalist  [name] is an 39 y.o. male.  HPI: Patient is a 39-year-old gentleman poorly to controlled diabetic who is status post first and second Ray amputation of the right foot. He's had chronic  osteomyelitis of the third toe MTP joint and now presents with cellulitis abscess of the fifth toe involving the MTP joint.  Past Medical History  Diagnosis Date  . Hypertension   . Diabetes mellitus     type 2 iddm x 18 yrs  . Vascular disease     poor circulation to left foot  . Acute osteomyelitis, ankle and foot 07/30/2010    Qualifier: Diagnosis of  By: Van Dam MD, Cornelius    . Anemia   . Renal insufficiency   . Hiatal hernia   . DDD (degenerative disc disease), lumbar   . Pancreatitis   . Gastroparesis   . Collagen vascular disease   . GERD (gastroesophageal reflux disease)     Past Surgical History  Procedure Laterality Date  . Cholecystectomy    . Toe amputation  2012    left foot; great toe and second toe  . Amputation  04/25/2011    Procedure: AMPUTATION DIGIT;  Surgeon: Camelia Stelzner V Zahava Quant, MD;  Location: [facility] OR;  Service: Orthopedics;  Laterality: Left;  Left foot 3rd toe amputation MTP joint, Gastroc Recession  Achilles Lengthening   . Amputation Bilateral 03/25/2013    Procedure: AMPUTATION RAY;  Surgeon: Jakhari Space V Jahmarion Popoff, MD;  Location: [facility] OR;  Service: Orthopedics;  Laterality: Bilateral;  Left Great Toe Amputation at  MTP Joint, Right 1st and 2nd Ray Amputation     Family History  Problem Relation Age of Onset  . Heart attack Father 52  . Hypertension Sister     Social History:  reports that he has been smoking Cigarettes.  He has a .3 pack-year smoking history. He has never used smokeless tobacco. He reports that he uses illicit drugs (Marijuana) about 10 times per week. He reports that he does  not drink alcohol.  Allergies: No Known Allergies  Medications: I have reviewed the patient's current medications.  Results for orders placed or performed during the hospital encounter of 10/01/14 (from the past 48 hour(s))  I-Stat CG4 Lactic Acid, ED     Status: None   Collection Time: 10/01/14  5:25 PM  Result Value Ref Range   Lactic Acid, Venous 1.77 0.5 - 2.0 mmol/L  CBC with Differential/Platelet     Status: Abnormal   Collection Time: 10/01/14  6:00 PM  Result Value Ref Range   WBC 10.2 4.0 - 10.5 K/uL   RBC 3.37 (L) 4.22 - 5.81 MIL/uL   Hemoglobin 10.1 (L) 13.0 - 17.0 g/dL   HCT 30.3 (L) 39.0 - 52.0 %   MCV 89.9 78.0 - 100.0 fL   MCH 30.0 26.0 - 34.0 pg   MCHC 33.3 30.0 - 36.0 g/dL   RDW 12.8 11.5 - 15.5 %   Platelets 269 150 - 400 K/uL   Neutrophils Relative % 68 43 - 77 %   Lymphocytes Relative 21 12 - 46 %   Monocytes Relative 9 3 - 12 %   Eosinophils Relative 2 0 - 5 %   Basophils Relative 0 0 - 1 %   Neutro Abs 7.0 1.7 - 7.7 K/uL   Lymphs Abs 2.1 0.7 - 4.0 K/uL     Monocytes Absolute 0.9 0.1 - 1.0 K/uL   Eosinophils Absolute 0.2 0.0 - 0.7 K/uL   Basophils Absolute 0.0 0.0 - 0.1 K/uL   Smear Review MORPHOLOGY UNREMARKABLE   Sedimentation rate     Status: Abnormal   Collection Time: 10/01/14  6:00 PM  Result Value Ref Range   Sed Rate >140 (H) 0 - 16 mm/hr  C-reactive protein     Status: Abnormal   Collection Time: 10/01/14  6:00 PM  Result Value Ref Range   CRP 11.7 (H) <0.60 mg/dL    Comment: Performed at [facility]  Basic metabolic panel     Status: Abnormal   Collection Time: 10/01/14  6:00 PM  Result Value Ref Range   Sodium 132 (L) 135 - 145 mmol/L    Comment: DELTA CHECK NOTED   Potassium 4.1 3.5 - 5.1 mmol/L   Chloride 94 (L) 96 - 112 mmol/L    Comment: DELTA CHECK NOTED   CO2 29 19 - 32 mmol/L   Glucose, Bld 294 (H) 70 - 99 mg/dL   BUN 14 6 - 23 mg/dL   Creatinine, Ser 1.71 (H) 0.50 - 1.35 mg/dL    Comment: DELTA CHECK NOTED    Calcium 8.9 8.4 - 10.5 mg/dL   GFR calc non Af Amer 49 (L) >90 mL/min   GFR calc Af Amer 57 (L) >90 mL/min    Comment: (NOTE) The eGFR has been calculated using the CKD EPI equation. This calculation has not been validated in all clinical situations. eGFR's persistently <90 mL/min signify possible Chronic Kidney Disease.    Anion gap 9 5 - 15  Glucose, capillary     Status: Abnormal   Collection Time: 10/01/14 10:04 PM  Result Value Ref Range   Glucose-Capillary 282 (H) 70 - 99 mg/dL  Protime-INR     Status: None   Collection Time: 10/01/14 10:25 PM  Result Value Ref Range   Prothrombin Time 14.4 11.6 - 15.2 seconds   INR 1.11 0.00 - 1.49  APTT     Status: None   Collection Time: 10/01/14 10:25 PM  Result Value Ref Range   aPTT 34 24 - 37 seconds  Type and screen     Status: None   Collection Time: 10/01/14 10:25 PM  Result Value Ref Range   ABO/RH(D) A POS    Antibody Screen NEG    Sample Expiration 10/04/2014   Lactic acid, plasma     Status: None   Collection Time: 10/01/14 10:25 PM  Result Value Ref Range   Lactic Acid, Venous 1.3 0.5 - 2.0 mmol/L  Comprehensive metabolic panel     Status: Abnormal   Collection Time: 10/02/14  6:04 AM  Result Value Ref Range   Sodium 133 (L) 135 - 145 mmol/L   Potassium 4.2 3.5 - 5.1 mmol/L   Chloride 98 96 - 112 mmol/L   CO2 26 19 - 32 mmol/L   Glucose, Bld 329 (H) 70 - 99 mg/dL   BUN 14 6 - 23 mg/dL   Creatinine, Ser 1.61 (H) 0.50 - 1.35 mg/dL   Calcium 8.4 8.4 - 10.5 mg/dL   Total Protein 6.6 6.0 - 8.3 g/dL   Albumin 2.2 (L) 3.5 - 5.2 g/dL   AST 25 0 - 37 U/L   ALT 25 0 - 53 U/L   Alkaline Phosphatase 72 39 - 117 U/L   Total Bilirubin 0.3 0.3 - 1.2 mg/dL   GFR calc non Af Amer 53 (L) >  90 mL/min   GFR calc Af Amer 62 (L) >90 mL/min    Comment: (NOTE) The eGFR has been calculated using the CKD EPI equation. This calculation has not been validated in all clinical situations. eGFR's persistently <90 mL/min signify possible  Chronic Kidney Disease.    Anion gap 9 5 - 15  CBC     Status: Abnormal   Collection Time: 10/02/14  6:04 AM  Result Value Ref Range   WBC 9.9 4.0 - 10.5 K/uL   RBC 3.37 (L) 4.22 - 5.81 MIL/uL   Hemoglobin 10.0 (L) 13.0 - 17.0 g/dL   HCT 30.5 (L) 39.0 - 52.0 %   MCV 90.5 78.0 - 100.0 fL   MCH 29.7 26.0 - 34.0 pg   MCHC 32.8 30.0 - 36.0 g/dL   RDW 13.1 11.5 - 15.5 %   Platelets 251 150 - 400 K/uL  Glucose, capillary     Status: Abnormal   Collection Time: 10/02/14  7:42 AM  Result Value Ref Range   Glucose-Capillary 276 (H) 70 - 99 mg/dL  Glucose, capillary     Status: Abnormal   Collection Time: 10/02/14 11:42 AM  Result Value Ref Range   Glucose-Capillary 203 (H) 70 - 99 mg/dL  Glucose, capillary     Status: Abnormal   Collection Time: 10/02/14  4:45 PM  Result Value Ref Range   Glucose-Capillary 180 (H) 70 - 99 mg/dL    Dg Foot 2 Views Right  10/01/2014   CLINICAL DATA:  Diabetic foot infection.  Drainage from fifth toe  EXAM: RIGHT FOOT - 2 VIEW  COMPARISON:  11/14/2013  FINDINGS: Amputation first toe at the level of the metatarsal phalangeal joint. Amputation of the second toe at the level of the distal second metatarsal  Chronic septic arthritis involving the third metatarsal phalangeal joint and interphalangeal joint. Bony proliferative changes and sclerotic change have progressed in the interval.  Soft tissue swelling of the fifth toe. Gas in the soft tissues. Lucency in the distal fifth phalanx suggestive of osteomyelitis.  IMPRESSION: Soft tissue swelling and soft tissue gas in the fifth toe with probable osteomyelitis distal fifth phalanx  Chronic septic arthritis third metatarsal phalangeal joint and interphalangeal joint progressive bony changes since the prior study.   Electronically Signed   By: Charles  Clark [name]   On: 10/01/2014 17:50    Review of Systems  All other systems reviewed and are negative.  Blood pressure 135/90, pulse 66, temperature 97.6 F (36.4  C), temperature source Oral, resp. rate 18, height 6' 1" (1.854 m), weight 115.713 kg (255 lb 1.6 oz), SpO2 100 %. Physical Exam Semination patient has an excellent dorsalis pedis pulse. He is status post first and second Ray amputation radiographs shows chronic changes a third toe MTP joint and he has purulent draining abscess from the fifth toe MTP joint. Assessment/Plan: Assessment: Diabetic insensate neuropathy with good pulses status post first and second Ray amputation with osteomyelitis involving the MTP joint of the third and fifth toes.  Plan: I feel the patient only has one foot salvage option and that is to proceed with a transmetatarsal amputation. Risks and benefits were discussed including infection neurovascular injury nonhealing of the wound need for additional surgery. Patient states he understands wishes to proceed at this time.  Mary-Anne Polizzi V 10/02/2014, 6:11 PM      

## 2014-10-02 NOTE — Progress Notes (Signed)
INITIAL NUTRITION ASSESSMENT  DOCUMENTATION CODES Per approved criteria  -Obesity Unspecified   INTERVENTION: Provide 30 ml Prostat po BID, each supplement provides 100 kcal and 15 grams of protein.  Diabetic diet and weight loss education given.  Encourage adequate PO intake.  NUTRITION DIAGNOSIS: Increased nutrient needs related to wound healing as evidenced by estimated nutrition needs.   Goal: Pt to meet >/= 90% of their estimated nutrition needs   Monitor:  PO intake, weight trends, labs,   Reason for Assessment: MD consult  39 y.o. male  Admitting Dx: Diabetic foot ulcer with osteomyelitis  ASSESSMENT: Pt with past medical history poorly controlled diabetes (recent A1c 13.6), GERD, tobacco abuse, PVD, chronic kidney disease-stage III, history of pancreatitis, gastroparesis, history of collagen vascular disease, history of diabetic foot osteomyelitis (post status of 4 toe amputation), who presented with right foot pain.  Pt reports having a good appetite currently. Meal completion has been 100%. PTA pt reports having a varied appetite at times ranging from 1-3 meals a day. Weight has been stable. Pt was educated to try to eat at least 3 meals a day with aid in caloric and protein needs as well as in wound healing. Pt is agreeable to Prostat to aid in protein needs. RD to order.Pt was encouraged to eat his food at meals. RD was additionally consulted for diet educations regarding weight loss and a diabetic diet. Education given.   Pt with no observed significant fat or muscle mass loss.  Labs and medications reviewed.  Height: Ht Readings from Last 1 Encounters:  10/01/14  (1.854 m)    Weight: Wt Readings from Last 1 Encounters:  10/01/14 255 lb 1.6 oz (115.713 kg)    Ideal Body Weight: 184 lbs  % Ideal Body Weight: 122%  Wt Readings from Last 10 Encounters:  10/01/14 255 lb 1.6 oz (115.713 kg)  09/29/14 256 lb 4.8 oz (116.257 kg)  05/30/14 232 lb 1.6 oz  (105.28 kg)  11/21/13 235 lb (106.595 kg)  11/21/13 235 lb (106.595 kg)  08/30/13 250 lb (113.399 kg)  05/12/13 235 lb (106.595 kg)  03/23/13 237 lb (107.502 kg)  10/29/12 222 lb (100.699 kg)  09/16/12 235 lb (106.595 kg)    Usual Body Weight: 255 lbs  % Usual Body Weight: 100%  BMI:  Body mass index is 33.66 kg/(m^2). Class I obesity  Estimated Nutritional Needs: Kcal: 2100-2300 Protein: 115-135 grams Fluid: 2.1 - 2.3 L/day  Skin: Diabetic foot ulcer, R, +2 RLE, non-pitting LLE edema  Diet Order: Diet heart healthy/carb modified Room service appropriate?: Yes; Fluid consistency:: Thin  EDUCATION NEEDS: -Education needs addressed   Intake/Output Summary (Last 24 hours) at 10/02/14 1543 Last data filed at 10/02/14 1339  Gross per 24 hour  Intake 2792.5 ml  Output   1300 ml  Net 1492.5 ml    Last BM: 4/22  Labs:   Recent Labs Lab 09/30/14 0655 10/01/14 1800 10/02/14 0604  NA 143 132* 133*  K 4.1 4.1 4.2  CL 104 94* 98  CO2 BUN CREATININE 1.03 1.71* 1.61*  CALCIUM 9.4 8.9 8.4  GLUCOSE 94 294* 329*    CBG (last 3)   Recent Labs  10/01/14 2204 10/02/14 0742 10/02/14 1142  GLUCAP 282* 276* 203*    Scheduled Meds: . gabapentin  200 mg Oral TID  . heparin  5,000 Units Subcutaneous 3 times per day  . insulin aspart  0-5 Units Subcutaneous QHS  .  insulin aspart  0-9 Units Subcutaneous TID WC  . insulin aspart  3 Units Subcutaneous TID WC  . insulin detemir  16 Units Subcutaneous QHS  . insulin NPH Human  10 Units Subcutaneous Once  . lisinopril  10 mg Oral Daily  . nicotine  21 mg Transdermal Daily  . pantoprazole  40 mg Oral BID  . piperacillin-tazobactam (ZOSYN)  IV  3.375 g Intravenous 3 times per day  . vancomycin  750 mg Intravenous Q12H    Continuous Infusions:   Past Medical History  Diagnosis Date  . Hypertension   . Diabetes mellitus     type 2 iddm x 18 yrs  . Vascular disease     poor circulation to left  foot  . Acute osteomyelitis, ankle and foot 07/30/2010    Qualifier: Diagnosis of  By: Daiva EvesVan Dam MD, Remi Haggardornelius    . Anemia   . Renal insufficiency   . Hiatal hernia   . DDD (degenerative disc disease), lumbar   . Pancreatitis   . Gastroparesis   . Collagen vascular disease   . GERD (gastroesophageal reflux disease)     Past Surgical History  Procedure Laterality Date  . Cholecystectomy    . Toe amputation  2012    left foot; great toe and second toe  . Amputation  04/25/2011    Procedure: AMPUTATION DIGIT;  Surgeon: Nadara MustardMarcus V Duda, MD;  [facility];  Service: Orthopedics;  Laterality: Left;  Left foot 3rd toe amputation MTP joint, Gastroc Recession  Achilles Lengthening   . Amputation Bilateral 03/25/2013    Procedure: AMPUTATION RAY;  Surgeon: Nadara MustardMarcus V Duda, MD;  [facility];  Service: Orthopedics;  Laterality: Bilateral;  Left Great Toe Amputation at  MTP Joint, Right 1st and 2nd Ray Amputation     Marijean NiemannStephanie La, MS, RD, LDN Pager # [phone] After hours/ weekend pager # [phone]

## 2014-10-02 NOTE — Plan of Care (Signed)
Problem: Food- and Nutrition-Related Knowledge Deficit (NB-1.1) Goal: Nutrition education Formal process to instruct or train a patient/client in a skill or to impart knowledge to help patients/clients voluntarily manage or modify food choices and eating behavior to maintain or improve health. Outcome: Completed/Met Date Met:  10/02/14  RD consulted for nutrition education regarding diabetes.     Lab Results  Component Value Date    HGBA1C 13.6* 09/28/2014    RD provided "Carbohydrate Counting for People with Diabetes" as well as "Weight loss tips" handout from the Academy of [employer] Dietetics. Discussed different food groups and their effects on blood sugar, emphasizing carbohydrate-containing foods. Provided list of carbohydrates and recommended serving sizes of common foods.  Discussed importance of controlled and consistent carbohydrate intake throughout the day. Emphasized adequate protein intake. Provided examples of ways to balance meals/snacks and encouraged intake of high-fiber, whole grain complex carbohydrates. Discussed diabetic friendly drink options. Recommended start of weight loss diet once pt is healed with goal of a 1 lb weight loss per week. Teach back method used.  Expect good compliance.  Kallie Locks, MS, RD, LDN Pager # [phone] After hours/ weekend pager # [phone]

## 2014-10-03 ENCOUNTER — Encounter (HOSPITAL_COMMUNITY): Payer: Self-pay | Admitting: Certified Registered Nurse Anesthetist

## 2014-10-03 ENCOUNTER — Inpatient Hospital Stay (HOSPITAL_COMMUNITY): Payer: PPO | Admitting: Certified Registered Nurse Anesthetist

## 2014-10-03 ENCOUNTER — Encounter (HOSPITAL_COMMUNITY)
Admission: EM | Disposition: A | Discharge status: Home / Self Care | Payer: Self-pay | Source: Home / Self Care | Attending: Internal Medicine

## 2014-10-03 DIAGNOSIS — E08621 Diabetes mellitus due to underlying condition with foot ulcer: Secondary | ICD-10-CM

## 2014-10-03 DIAGNOSIS — L97529 Non-pressure chronic ulcer of other part of left foot with unspecified severity: Secondary | ICD-10-CM

## 2014-10-03 HISTORY — PX: AMPUTATION: SHX166

## 2014-10-03 LAB — CBC
HEMATOCRIT: 29.4 % — AB (ref 39.0–52.0)
HEMOGLOBIN: 9.6 g/dL — AB (ref 13.0–17.0)
MCH: 29.4 pg (ref 26.0–34.0)
MCHC: 32.7 g/dL (ref 30.0–36.0)
MCV: 90.2 fL (ref 78.0–100.0)
Platelets: 270 10*3/uL (ref 150–400)
RBC: 3.26 MIL/uL — ABNORMAL LOW (ref 4.22–5.81)
RDW: 13 % (ref 11.5–15.5)
WBC: 7.8 10*3/uL (ref 4.0–10.5)

## 2014-10-03 LAB — SURGICAL PCR SCREEN
MRSA, PCR: NEGATIVE
STAPHYLOCOCCUS AUREUS: NEGATIVE

## 2014-10-03 LAB — GLUCOSE, CAPILLARY
GLUCOSE-CAPILLARY: 184 mg/dL — AB (ref 70–99)
Glucose-Capillary: 147 mg/dL — ABNORMAL HIGH (ref 70–99)
Glucose-Capillary: 148 mg/dL — ABNORMAL HIGH (ref 70–99)
Glucose-Capillary: 141 mg/dL — ABNORMAL HIGH (ref 70–99)
Glucose-Capillary: 135 mg/dL — ABNORMAL HIGH (ref 70–99)
Glucose-Capillary: 216 mg/dL — ABNORMAL HIGH (ref 70–99)

## 2014-10-03 LAB — BASIC METABOLIC PANEL
Anion gap: 8 (ref 5–15)
BUN: 15 mg/dL (ref 6–23)
CALCIUM: 8.5 mg/dL (ref 8.4–10.5)
CHLORIDE: 102 mmol/L (ref 96–112)
CO2: 26 mmol/L (ref 19–32)
CREATININE: 1.59 mg/dL — AB (ref 0.50–1.35)
GFR, EST AFRICAN AMERICAN: 63 mL/min — AB (ref >90)
GFR, EST NON AFRICAN AMERICAN: 54 mL/min — AB (ref >90)
GLUCOSE: 154 mg/dL — AB (ref 70–99)
Potassium: 3.8 mmol/L (ref 3.5–5.1)
SODIUM: 136 mmol/L (ref 135–145)

## 2014-10-03 LAB — CULTURE, BLOOD (ROUTINE X 2)
CULTURE: NO GROWTH
Culture: NO GROWTH

## 2014-10-03 SURGERY — AMPUTATION, FOOT, PARTIAL
Anesthesia: General | Laterality: Right

## 2014-10-03 MED ORDER — CEFAZOLIN SODIUM-DEXTROSE 2-3 GM-% IV SOLR
2.0000 g | INTRAVENOUS | Status: DC
  Filled 2014-10-03: qty 50

## 2014-10-03 MED ORDER — SENNA 8.6 MG PO TABS
2.0000 | ORAL_TABLET | Freq: Every day | ORAL | Status: DC
  Administered 2014-10-04 (×2): 17.2 mg via ORAL
  Filled 2014-10-03 (×3): qty 2

## 2014-10-03 MED ORDER — SODIUM CHLORIDE 0.45 % IV SOLN
INTRAVENOUS | Status: DC
  Administered 2014-10-03: 05:00:00 via INTRAVENOUS

## 2014-10-03 MED ORDER — LACTATED RINGERS IV SOLN
INTRAVENOUS | Status: DC
  Administered 2014-10-03: 11:00:00 via INTRAVENOUS

## 2014-10-03 MED ORDER — MEPERIDINE HCL 25 MG/ML IJ SOLN: 6.2500 mg | INTRAMUSCULAR | Status: DC | PRN

## 2014-10-03 MED ORDER — MIDAZOLAM HCL 2 MG/2ML IJ SOLN
INTRAMUSCULAR | Status: AC
  Filled 2014-10-03: qty 2

## 2014-10-03 MED ORDER — POLYETHYLENE GLYCOL 3350 17 G PO PACK
17.0000 g | PACK | Freq: Every day | ORAL | Status: DC | PRN
  Filled 2014-10-03: qty 1

## 2014-10-03 MED ORDER — ACETAMINOPHEN 325 MG PO TABS: 650.0000 mg | ORAL_TABLET | Freq: Four times a day (QID) | ORAL | Status: DC | PRN

## 2014-10-03 MED ORDER — ONDANSETRON HCL 4 MG PO TABS: 4.0000 mg | ORAL_TABLET | Freq: Four times a day (QID) | ORAL | Status: DC | PRN

## 2014-10-03 MED ORDER — PROPOFOL 10 MG/ML IV BOLUS
INTRAVENOUS | Status: DC | PRN
  Administered 2014-10-03: 50 mg via INTRAVENOUS
  Administered 2014-10-03: 150 mg via INTRAVENOUS
  Administered 2014-10-03: 100 mg via INTRAVENOUS

## 2014-10-03 MED ORDER — METHOCARBAMOL 500 MG PO TABS
500.0000 mg | ORAL_TABLET | Freq: Four times a day (QID) | ORAL | Status: DC | PRN
  Administered 2014-10-04 (×3): 500 mg via ORAL
  Filled 2014-10-03 (×3): qty 1

## 2014-10-03 MED ORDER — SODIUM CHLORIDE 0.9 % IV SOLN: INTRAVENOUS | Status: DC

## 2014-10-03 MED ORDER — PHENYLEPHRINE HCL 10 MG/ML IJ SOLN
INTRAMUSCULAR | Status: DC | PRN
  Administered 2014-10-03 (×3): 80 ug via INTRAVENOUS

## 2014-10-03 MED ORDER — HYDROMORPHONE HCL 1 MG/ML IJ SOLN
INTRAMUSCULAR | Status: AC
  Filled 2014-10-03: qty 1

## 2014-10-03 MED ORDER — ONDANSETRON HCL 4 MG/2ML IJ SOLN
INTRAMUSCULAR | Status: DC | PRN
  Administered 2014-10-03: 4 mg via INTRAVENOUS

## 2014-10-03 MED ORDER — MIDAZOLAM HCL 2 MG/2ML IJ SOLN: 0.5000 mg | Freq: Once | INTRAMUSCULAR | Status: DC | PRN

## 2014-10-03 MED ORDER — ACETAMINOPHEN 650 MG RE SUPP: 650.0000 mg | Freq: Four times a day (QID) | RECTAL | Status: DC | PRN

## 2014-10-03 MED ORDER — FENTANYL CITRATE (PF) 100 MCG/2ML IJ SOLN
INTRAMUSCULAR | Status: DC | PRN
  Administered 2014-10-03: 50 ug via INTRAVENOUS

## 2014-10-03 MED ORDER — LIDOCAINE HCL (CARDIAC) 20 MG/ML IV SOLN
INTRAVENOUS | Status: AC
  Filled 2014-10-03: qty 5

## 2014-10-03 MED ORDER — METHOCARBAMOL 1000 MG/10ML IJ SOLN
500.0000 mg | Freq: Four times a day (QID) | INTRAVENOUS | Status: DC | PRN
  Filled 2014-10-03: qty 5

## 2014-10-03 MED ORDER — HYDROMORPHONE HCL 1 MG/ML IJ SOLN: 0.2500 mg | INTRAMUSCULAR | Status: DC | PRN

## 2014-10-03 MED ORDER — METOCLOPRAMIDE HCL 5 MG PO TABS: 5.0000 mg | ORAL_TABLET | Freq: Three times a day (TID) | ORAL | Status: DC | PRN

## 2014-10-03 MED ORDER — PROMETHAZINE HCL 25 MG/ML IJ SOLN: 6.2500 mg | INTRAMUSCULAR | Status: DC | PRN

## 2014-10-03 MED ORDER — CHLORHEXIDINE GLUCONATE 4 % EX LIQD
60.0000 mL | Freq: Once | CUTANEOUS | Status: AC
  Administered 2014-10-03: 4 via TOPICAL
  Filled 2014-10-03: qty 60

## 2014-10-03 MED ORDER — DOCUSATE SODIUM 100 MG PO CAPS
100.0000 mg | ORAL_CAPSULE | Freq: Two times a day (BID) | ORAL | Status: DC
Start: 2014-10-03 — End: 2014-10-05
  Administered 2014-10-04 – 2014-10-05 (×4): 100 mg via ORAL
  Filled 2014-10-03 (×6): qty 1

## 2014-10-03 MED ORDER — LIDOCAINE HCL (CARDIAC) 20 MG/ML IV SOLN
INTRAVENOUS | Status: DC | PRN
  Administered 2014-10-03: 20 mg via INTRAVENOUS

## 2014-10-03 MED ORDER — MIDAZOLAM HCL 5 MG/5ML IJ SOLN
INTRAMUSCULAR | Status: DC | PRN
  Administered 2014-10-03: 2 mg via INTRAVENOUS

## 2014-10-03 MED ORDER — HYDROMORPHONE HCL 1 MG/ML IJ SOLN
1.0000 mg | INTRAMUSCULAR | Status: DC | PRN
Start: 2014-10-03 — End: 2014-10-05
  Administered 2014-10-03: 15:00:00 via INTRAVENOUS
  Administered 2014-10-04 – 2014-10-05 (×8): 1 mg via INTRAVENOUS
  Filled 2014-10-03 (×8): qty 1

## 2014-10-03 MED ORDER — OXYCODONE HCL 5 MG PO TABS
5.0000 mg | ORAL_TABLET | ORAL | Status: DC | PRN
  Administered 2014-10-04: 10 mg via ORAL
  Filled 2014-10-03: qty 2

## 2014-10-03 MED ORDER — PROPOFOL 10 MG/ML IV BOLUS
INTRAVENOUS | Status: AC
  Filled 2014-10-03: qty 20

## 2014-10-03 MED ORDER — PHENYLEPHRINE 40 MCG/ML (10ML) SYRINGE FOR IV PUSH (FOR BLOOD PRESSURE SUPPORT)
PREFILLED_SYRINGE | INTRAVENOUS | Status: AC
  Filled 2014-10-03: qty 10

## 2014-10-03 MED ORDER — LACTATED RINGERS IV SOLN
INTRAVENOUS | Status: DC | PRN
  Administered 2014-10-03: 11:00:00 via INTRAVENOUS

## 2014-10-03 MED ORDER — FENTANYL CITRATE (PF) 250 MCG/5ML IJ SOLN
INTRAMUSCULAR | Status: AC
  Filled 2014-10-03: qty 5

## 2014-10-03 MED ORDER — METOCLOPRAMIDE HCL 5 MG/ML IJ SOLN: 5.0000 mg | Freq: Three times a day (TID) | INTRAMUSCULAR | Status: DC | PRN

## 2014-10-03 MED ORDER — 0.9 % SODIUM CHLORIDE (POUR BTL) OPTIME
TOPICAL | Status: DC | PRN
  Administered 2014-10-03: 1000 mL

## 2014-10-03 MED ORDER — ONDANSETRON HCL 4 MG/2ML IJ SOLN: 4.0000 mg | Freq: Four times a day (QID) | INTRAMUSCULAR | Status: DC | PRN

## 2014-10-03 SURGICAL SUPPLY — 37 items
BLADE SAW SGTL HD 18.5X60.5X1. (BLADE) ×3 IMPLANT
BLADE SURG 10 STRL SS (BLADE) ×2 IMPLANT
BNDG COHESIVE 4X5 TAN STRL (GAUZE/BANDAGES/DRESSINGS) ×3 IMPLANT
BNDG GAUZE ELAST 4 BULKY (GAUZE/BANDAGES/DRESSINGS) ×4 IMPLANT
COVER SURGICAL LIGHT HANDLE (MISCELLANEOUS) ×3 IMPLANT
DRAPE U-SHAPE 47X51 STRL (DRAPES) ×3 IMPLANT
DRSG ADAPTIC 3X8 NADH LF (GAUZE/BANDAGES/DRESSINGS) ×3 IMPLANT
DRSG PAD ABDOMINAL 8X10 ST (GAUZE/BANDAGES/DRESSINGS) ×3 IMPLANT
DURAPREP 26ML APPLICATOR (WOUND CARE) ×3 IMPLANT
ELECT REM PT RETURN 9FT ADLT (ELECTROSURGICAL) ×3
ELECTRODE REM PT RTRN 9FT ADLT (ELECTROSURGICAL) ×1 IMPLANT
GAUZE SPONGE 4X4 12PLY STRL (GAUZE/BANDAGES/DRESSINGS) ×3 IMPLANT
GLOVE BIO SURGEON STRL SZ 6.5 (GLOVE) ×1 IMPLANT
GLOVE BIO SURGEON STRL SZ7 (GLOVE) ×3 IMPLANT
GLOVE BIO SURGEONS STRL SZ 6.5 (GLOVE) ×1
GLOVE BIOGEL PI IND STRL 6.5 (GLOVE) ×1 IMPLANT
GLOVE BIOGEL PI IND STRL 7.0 (GLOVE) IMPLANT
GLOVE BIOGEL PI IND STRL 9 (GLOVE) ×1 IMPLANT
GLOVE BIOGEL PI INDICATOR 6.5 (GLOVE) ×2
GLOVE BIOGEL PI INDICATOR 7.0 (GLOVE) ×2
GLOVE BIOGEL PI INDICATOR 9 (GLOVE) ×2
GLOVE SURG ORTHO 9.0 STRL STRW (GLOVE) ×3 IMPLANT
GOWN STRL REUS W/ TWL LRG LVL3 (GOWN DISPOSABLE) IMPLANT
GOWN STRL REUS W/ TWL XL LVL3 (GOWN DISPOSABLE) ×3 IMPLANT
GOWN STRL REUS W/TWL LRG LVL3 (GOWN DISPOSABLE) ×6
GOWN STRL REUS W/TWL XL LVL3 (GOWN DISPOSABLE) ×3
KIT BASIN OR (CUSTOM PROCEDURE TRAY) ×3 IMPLANT
KIT ROOM TURNOVER OR (KITS) ×3 IMPLANT
NS IRRIG 1000ML POUR BTL (IV SOLUTION) ×3 IMPLANT
PACK ORTHO EXTREMITY (CUSTOM PROCEDURE TRAY) ×3 IMPLANT
PAD ARMBOARD 7.5X6 YLW CONV (MISCELLANEOUS) ×3 IMPLANT
SPONGE LAP 18X18 X RAY DECT (DISPOSABLE) ×3 IMPLANT
SUT ETHILON 2 0 PSLX (SUTURE) ×7 IMPLANT
SUT VIC AB 2-0 CTB1 (SUTURE) IMPLANT
TOWEL OR 17X24 6PK STRL BLUE (TOWEL DISPOSABLE) ×3 IMPLANT
TOWEL OR 17X26 10 PK STRL BLUE (TOWEL DISPOSABLE) ×3 IMPLANT
WATER STERILE IRR 1000ML POUR (IV SOLUTION) ×1 IMPLANT

## 2014-10-03 NOTE — H&P (View-Only) (Signed)
Reason for Consult: Abscess osteomyelitis fifth toe right foot with chronic osteomyelitis third MTP joint status post first and second Ray amputations Referring Physician: Hospitalist  [name] is an 39 y.o. male.  HPI: Patient is a 40 year old gentleman poorly to controlled diabetic who is status post first and second Ray amputation of the right foot. He's had chronic  osteomyelitis of the third toe MTP joint and now presents with cellulitis abscess of the fifth toe involving the MTP joint.  Past Medical History  Diagnosis Date  . Hypertension   . Diabetes mellitus     type 2 iddm x 18 yrs  . Vascular disease     poor circulation to left foot  . Acute osteomyelitis, ankle and foot 07/30/2010    Qualifier: Diagnosis of  By: Tommy Medal MD, Roderic Scarce    . Anemia   . Renal insufficiency   . Hiatal hernia   . DDD (degenerative disc disease), lumbar   . Pancreatitis   . Gastroparesis   . Collagen vascular disease   . GERD (gastroesophageal reflux disease)     Past Surgical History  Procedure Laterality Date  . Cholecystectomy    . Toe amputation  2012    left foot; great toe and second toe  . Amputation  04/25/2011    Procedure: AMPUTATION DIGIT;  Surgeon: Newt Minion, MD;  Location: [place];  Service: Orthopedics;  Laterality: Left;  Left foot 3rd toe amputation MTP joint, Gastroc Recession  Achilles Lengthening   . Amputation Bilateral 03/25/2013    Procedure: AMPUTATION RAY;  Surgeon: Newt Minion, MD;  Location: [place];  Service: Orthopedics;  Laterality: Bilateral;  Left Great Toe Amputation at  MTP Joint, Right 1st and 2nd Ray Amputation     Family History  Problem Relation Age of Onset  . Heart attack Father 1  . Hypertension Sister     Social History:  reports that he has been smoking Cigarettes.  He has a .3 pack-year smoking history. He has never used smokeless tobacco. He reports that he uses illicit drugs (Marijuana) about 10 times per week. He reports that he does  not drink alcohol.  Allergies: No Known Allergies  Medications: I have reviewed the patient's current medications.  Results for orders placed or performed during the hospital encounter of 10/01/14 (from the past 48 hour(s))  I-Stat CG4 Lactic Acid, ED     Status: None   Collection Time: 10/01/14  5:25 PM  Result Value Ref Range   Lactic Acid, Venous 1.77 0.5 - 2.0 mmol/L  CBC with Differential/Platelet     Status: Abnormal   Collection Time: 10/01/14  6:00 PM  Result Value Ref Range   WBC 10.2 4.0 - 10.5 K/uL   RBC 3.37 (L) 4.22 - 5.81 MIL/uL   Hemoglobin 10.1 (L) 13.0 - 17.0 g/dL   HCT 30.3 (L) 39.0 - 52.0 %   MCV 89.9 78.0 - 100.0 fL   MCH 30.0 26.0 - 34.0 pg   MCHC 33.3 30.0 - 36.0 g/dL   RDW 12.8 11.5 - 15.5 %   Platelets 269 150 - 400 K/uL   Neutrophils Relative % 68 43 - 77 %   Lymphocytes Relative 21 12 - 46 %   Monocytes Relative 9 3 - 12 %   Eosinophils Relative 2 0 - 5 %   Basophils Relative 0 0 - 1 %   Neutro Abs 7.0 1.7 - 7.7 K/uL   Lymphs Abs 2.1 0.7 - 4.0 K/uL  Monocytes Absolute 0.9 0.1 - 1.0 K/uL   Eosinophils Absolute 0.2 0.0 - 0.7 K/uL   Basophils Absolute 0.0 0.0 - 0.1 K/uL   Smear Review MORPHOLOGY UNREMARKABLE   Sedimentation rate     Status: Abnormal   Collection Time: 10/01/14  6:00 PM  Result Value Ref Range   Sed Rate >140 (H) 0 - 16 mm/hr  C-reactive protein     Status: Abnormal   Collection Time: 10/01/14  6:00 PM  Result Value Ref Range   CRP 11.7 (H) <0.60 mg/dL    Comment: Performed at [facility] metabolic panel     Status: Abnormal   Collection Time: 10/01/14  6:00 PM  Result Value Ref Range   Sodium 132 (L) 135 - 145 mmol/L    Comment: DELTA CHECK NOTED   Potassium 4.1 3.5 - 5.1 mmol/L   Chloride 94 (L) 96 - 112 mmol/L    Comment: DELTA CHECK NOTED   CO2 29 19 - 32 mmol/L   Glucose, Bld 294 (H) 70 - 99 mg/dL   BUN 14 6 - 23 mg/dL   Creatinine, Ser 1.71 (H) 0.50 - 1.35 mg/dL    Comment: DELTA CHECK NOTED    Calcium 8.9 8.4 - 10.5 mg/dL   GFR calc non Af Amer 49 (L) >90 mL/min   GFR calc Af Amer 57 (L) >90 mL/min    Comment: (NOTE) The eGFR has been calculated using the CKD EPI equation. This calculation has not been validated in all clinical situations. eGFR's persistently <90 mL/min signify possible Chronic Kidney Disease.    Anion gap 9 5 - 15  Glucose, capillary     Status: Abnormal   Collection Time: 10/01/14 10:04 PM  Result Value Ref Range   Glucose-Capillary 282 (H) 70 - 99 mg/dL  Protime-INR     Status: None   Collection Time: 10/01/14 10:25 PM  Result Value Ref Range   Prothrombin Time 14.4 11.6 - 15.2 seconds   INR 1.11 0.00 - 1.49  APTT     Status: None   Collection Time: 10/01/14 10:25 PM  Result Value Ref Range   aPTT 34 24 - 37 seconds  Type and screen     Status: None   Collection Time: 10/01/14 10:25 PM  Result Value Ref Range   ABO/RH(D) A POS    Antibody Screen NEG    Sample Expiration 10/04/2014   Lactic acid, plasma     Status: None   Collection Time: 10/01/14 10:25 PM  Result Value Ref Range   Lactic Acid, Venous 1.3 0.5 - 2.0 mmol/L  Comprehensive metabolic panel     Status: Abnormal   Collection Time: 10/02/14  6:04 AM  Result Value Ref Range   Sodium 133 (L) 135 - 145 mmol/L   Potassium 4.2 3.5 - 5.1 mmol/L   Chloride 98 96 - 112 mmol/L   CO2 26 19 - 32 mmol/L   Glucose, Bld 329 (H) 70 - 99 mg/dL   BUN 14 6 - 23 mg/dL   Creatinine, Ser 1.61 (H) 0.50 - 1.35 mg/dL   Calcium 8.4 8.4 - 10.5 mg/dL   Total Protein 6.6 6.0 - 8.3 g/dL   Albumin 2.2 (L) 3.5 - 5.2 g/dL   AST 25 0 - 37 U/L   ALT 25 0 - 53 U/L   Alkaline Phosphatase 72 39 - 117 U/L   Total Bilirubin 0.3 0.3 - 1.2 mg/dL   GFR calc non Af Amer 53 (L) >  90 mL/min   GFR calc Af Amer 62 (L) >90 mL/min    Comment: (NOTE) The eGFR has been calculated using the CKD EPI equation. This calculation has not been validated in all clinical situations. eGFR's persistently <90 mL/min signify possible  Chronic Kidney Disease.    Anion gap 9 5 - 15  CBC     Status: Abnormal   Collection Time: 10/02/14  6:04 AM  Result Value Ref Range   WBC 9.9 4.0 - 10.5 K/uL   RBC 3.37 (L) 4.22 - 5.81 MIL/uL   Hemoglobin 10.0 (L) 13.0 - 17.0 g/dL   HCT 30.5 (L) 39.0 - 52.0 %   MCV 90.5 78.0 - 100.0 fL   MCH 29.7 26.0 - 34.0 pg   MCHC 32.8 30.0 - 36.0 g/dL   RDW 13.1 11.5 - 15.5 %   Platelets 251 150 - 400 K/uL  Glucose, capillary     Status: Abnormal   Collection Time: 10/02/14  7:42 AM  Result Value Ref Range   Glucose-Capillary 276 (H) 70 - 99 mg/dL  Glucose, capillary     Status: Abnormal   Collection Time: 10/02/14 11:42 AM  Result Value Ref Range   Glucose-Capillary 203 (H) 70 - 99 mg/dL  Glucose, capillary     Status: Abnormal   Collection Time: 10/02/14  4:45 PM  Result Value Ref Range   Glucose-Capillary 180 (H) 70 - 99 mg/dL    Dg Foot 2 Views Right  10/01/2014   CLINICAL DATA:  Diabetic foot infection.  Drainage from fifth toe  EXAM: RIGHT FOOT - 2 VIEW  COMPARISON:  11/14/2013  FINDINGS: Amputation first toe at the level of the metatarsal phalangeal joint. Amputation of the second toe at the level of the distal second metatarsal  Chronic septic arthritis involving the third metatarsal phalangeal joint and interphalangeal joint. Bony proliferative changes and sclerotic change have progressed in the interval.  Soft tissue swelling of the fifth toe. Gas in the soft tissues. Lucency in the distal fifth phalanx suggestive of osteomyelitis.  IMPRESSION: Soft tissue swelling and soft tissue gas in the fifth toe with probable osteomyelitis distal fifth phalanx  Chronic septic arthritis third metatarsal phalangeal joint and interphalangeal joint progressive bony changes since the prior study.   Electronically Signed   By: Franchot Gallo [name]   On: 10/01/2014 17:50    Review of Systems  All other systems reviewed and are negative.  Blood pressure 135/90, pulse 66, temperature 97.6 F (36.4  C), temperature source Oral, resp. rate 18, height _0  (1.854 m), weight 115.713 kg (255 lb 1.6 oz), SpO2 100 %. Physical Exam Semination patient has an excellent dorsalis pedis pulse. He is status post first and second Ray amputation radiographs shows chronic changes a third toe MTP joint and he has purulent draining abscess from the fifth toe MTP joint. Assessment/Plan: Assessment: Diabetic insensate neuropathy with good pulses status post first and second Ray amputation with osteomyelitis involving the MTP joint of the third and fifth toes.  Plan: I feel the patient only has one foot salvage option and that is to proceed with a transmetatarsal amputation. Risks and benefits were discussed including infection neurovascular injury nonhealing of the wound need for additional surgery. Patient states he understands wishes to proceed at this time.  DUDA,MARCUS V 10/02/2014, 6:11 PM

## 2014-10-03 NOTE — Interval H&P Note (Signed)
History and Physical Interval Note:  10/03/2014 6:38 AM  [name]  has presented today for surgery, with the diagnosis of RIGHT FOOT OSTEOMYELITIS AND ABCESS  The various methods of treatment have been discussed with the patient and family. After consideration of risks, benefits and other options for treatment, the patient has consented to  Procedure(s): AMPUTATION MIDFOOT (Right) as a surgical intervention .  The patient's history has been reviewed, patient examined, no change in status, stable for surgery.  I have reviewed the patient's chart and labs.  Questions were answered to the patient's satisfaction.     [name]

## 2014-10-03 NOTE — Anesthesia Procedure Notes (Signed)
Procedure Name: LMA Insertion Date/Time: 10/03/2014 11:26 AM Performed by: Rise PatienceBELL, Karynn Deblasi T Pre-anesthesia Checklist: Patient identified, Emergency Drugs available, Suction available and Patient being monitored Patient Re-evaluated:Patient Re-evaluated prior to inductionOxygen Delivery Method: Circle system utilized Preoxygenation: Pre-oxygenation with 100% oxygen Intubation Type: IV induction LMA: LMA inserted LMA Size: 5.0 Number of attempts: 1 Placement Confirmation: positive ETCO2 and breath sounds checked- equal and bilateral Tube secured with: Tape Dental Injury: Teeth and Oropharynx as per pre-operative assessment

## 2014-10-03 NOTE — Anesthesia Preprocedure Evaluation (Addendum)
Anesthesia Evaluation  Patient identified by MRN, date of birth, ID band Patient awake    Reviewed: Allergy & Precautions, NPO status , Patient's Chart, lab work & pertinent test results  History of Anesthesia Complications Negative for: history of anesthetic complications  Airway Mallampati: II  TM Distance: >3 FB Neck ROM: Full    Dental  (+) Teeth Intact, Dental Advisory Given   Pulmonary COPDCurrent Smoker,  breath sounds clear to auscultation        Cardiovascular hypertension, Pt. on medications - angina+ Peripheral Vascular Disease Rhythm:Regular Rate:Normal  '15 stress test: EF 56%, no ischemia, normal perfusion   Neuro/Psych negative neurological ROS     GI/Hepatic Neg liver ROS, GERD-  Medicated and Controlled,  Endo/Other  diabetes (glu 148), Insulin DependentMorbid obesity  Renal/GU Renal InsufficiencyRenal disease (creat 1.59)     Musculoskeletal   Abdominal (+) + obese,   Peds  Hematology  (+) Blood dyscrasia (Hb 9.6), ,   Anesthesia Other Findings   Reproductive/Obstetrics                           Anesthesia Physical Anesthesia Plan  ASA: III  Anesthesia Plan: General   Post-op Pain Management:    Induction: Intravenous  Airway Management Planned: LMA  Additional Equipment:   Intra-op Plan:   Post-operative Plan:   Informed Consent: I have reviewed the patients History and Physical, chart, labs and discussed the procedure including the risks, benefits and alternatives for the proposed anesthesia with the patient or authorized representative who has indicated his/her understanding and acceptance.   Dental advisory given  Plan Discussed with: CRNA and Surgeon  Anesthesia Plan Comments: (Plan routine monitors, GA- LMA OK  Pt refuses regional anesthesia)        Anesthesia Quick Evaluation

## 2014-10-03 NOTE — Progress Notes (Signed)
Orthopedic Tech Progress Note Patient Details:  Curtis FettersLamont Clark 11/29/1975 [id number]  Ortho Devices Type of Ortho Device: Postop shoe/boot Ortho Device/Splint Location: rle Ortho Device/Splint Interventions: Application   [name] 10/03/2014, 4:22 PM

## 2014-10-03 NOTE — Transfer of Care (Signed)
Immediate Anesthesia Transfer of Care Note  Patient: Curtis FettersLamont Clark  Procedure(s) Performed: Procedure(s): AMPUTATION MIDFOOT (Right)  Patient Location: PACU  Anesthesia Type:General  Level of Consciousness: awake, alert  and oriented  Airway & Oxygen Therapy: Patient Spontanous Breathing and Patient connected to nasal cannula oxygen  Post-op Assessment: Report given to RN, Post -op Vital signs reviewed and stable and Patient moving all extremities X 4  Post vital signs: Reviewed and stable  Last Vitals:  Filed Vitals:   10/03/14 0938  BP: 153/89  Pulse: 71  Temp: 36.7 C  Resp:     Complications: No apparent anesthesia complications

## 2014-10-03 NOTE — Progress Notes (Signed)
PT Cancellation Note  Patient Details Name: [name] MRN: [id number] DOB: 02/26/1976   Cancelled Treatment:    Reason Eval/Treat Not Completed: Medical issues which prohibited therapy  Plan for transmet amputation;  Will follow up postop;   Thanks, Van ClinesHolly Delancey Moraes, PT  Acute Rehabilitation Services Pager [phone] Office [phone]      Van ClinesGarrigan, [name] [facility] 10/03/2014, 8:05 AM

## 2014-10-03 NOTE — Op Note (Signed)
10/01/2014 - 10/03/2014  11:52 AM  PATIENT:  [name]    PRE-OPERATIVE DIAGNOSIS:  RIGHT FOOT OSTEOMYELITIS AND ABCESS  POST-OPERATIVE DIAGNOSIS:  Same  PROCEDURE:  AMPUTATION MIDFOOT  SURGEON:  Nadara MustardUDA,MARCUS V, MD  PHYSICIAN ASSISTANT:None ANESTHESIA:   General  PREOPERATIVE INDICATIONS:  [name] is a  39 y.o. male with a diagnosis of RIGHT FOOT OSTEOMYELITIS AND ABCESS who failed conservative measures and elected for surgical management.    The risks benefits and alternatives were discussed with the patient preoperatively including but not limited to the risks of infection, bleeding, nerve injury, cardiopulmonary complications, the need for revision surgery, among others, and the patient was willing to proceed.  OPERATIVE IMPLANTS: None  OPERATIVE FINDINGS: Ischemic microcirculation  OPERATIVE PROCEDURE: Patient was brought to the operating room and underwent a general anesthetic. After adequate levels of anesthesia were obtained patient's right lower extremity was prepped using DuraPrep draped into a sterile field. A timeout was called. A fishmouth incision was made proximal to the ulcerative tissue. This was carried down to bone A oscillating saw was used to resect the metatarsals through the midshaft. Electrocautery was used for hemostasis. The wound was irrigated with normal saline. The wound was closed using 2-0 nylon. A sterile compressive dressing was applied. Patient was extubated taken to the PACU in stable condition.

## 2014-10-03 NOTE — Anesthesia Postprocedure Evaluation (Signed)
  Anesthesia Post-op Note  Patient: [name]  Procedure(s) Performed: Procedure(s): AMPUTATION MIDFOOT (Right)  Patient Location: PACU  Anesthesia Type:General  Level of Consciousness: awake, alert , oriented and patient cooperative  Airway and Oxygen Therapy: Patient Spontanous Breathing and Patient connected to nasal cannula oxygen  Post-op Pain: none  Post-op Assessment: Post-op Vital signs reviewed, Patient's Cardiovascular Status Stable, Respiratory Function Stable, Patent Airway, No signs of Nausea or vomiting and Pain level controlled  Post-op Vital Signs: Reviewed and stable  Last Vitals:  Filed Vitals:   10/03/14 1246  BP: 148/84  Pulse: 71  Temp: 36.7 C  Resp: 16    Complications: No apparent anesthesia complications

## 2014-10-03 NOTE — Progress Notes (Addendum)
[facility] PROGRESS NOTE  [name] XLK:[id number] DOB: 08-20-1975 DOA: 10/01/2014 PCP: [name], MD  Brief Summary  [name] is a 39 y.o. male with past medical history poorly controlled diabetes (recent A1c 13.6), GERD, tobacco abuse, PVD, chronic kidney disease-stage III, history of pancreatitis, gastroparesis, history of collagen vascular disease, history of diabetic foot osteomyelitis (post status of 4 toe amputation), who presented with right foot pain.  He was hospitalized from 4/20 to 09/30/14 with fever and generalized weakness and was felt to have a viral infection or viral gastroenteritis. He also had acute on chronic renal injury with kidney function back to baseline at the time of discharge.  He presented with increasing right foot pain with clear drainage that turned brown.   X-ray of right foot showed possible osteomyelitis in right 5th toe.  ESR > 140.  He was started on vancomycin and zosyn.  Dr. Sharol Given performed mid-foot amputation on 4/26.     Assessment/Plan  Osteomyelitis in right 5th toe with surrounding cellulitis:  No sepsis. Hemodynamically stable.  - Started empiric antimicrobial treatment with vancomycin and zosyn  - Blood cultures NGTD - Continue prn Tylenol for fever, prn zofran QTc 430 for nausea and Percocet for pain.  - EKG for monitoring QTc  - ESR >140 and CRP 11.7 - Dr. Sharol Given performed mid-foot amputation on 4/26 - Will need to continue antibiotics for approximately a 7-day course for associated cellulitis  Diabetes mellitus type 2, uncontrolled with chronic kidney disease: A1c was 13.6 on 09/28/14. Very poorly controlled.  -  Supposed to take insulin 70/30 mix, 12 units twice a day, but he has not been taken recently. -  d/c'd humulin 70/30, 12 units twice a day = 24 units TDI -  Continue levemir 16 units qhs -  Continue SSI with aspart 3 units AC and HS insulin  Chronic kidney disease-3, baseline creatinine 1.5-2.0. His creatinine is  1.71 on admission, which is at baseline.  Hypertension, BP elevated - Continue lisinopril  Tobacco abuse: -Did counseling about importance of quitting smoking -nicotine patch  Gerd:  Stable, continue protonix -  Consider changing to H2 blocker given CKD  Pseudohyponatremia from hyperglycemia  Diet:  diabetic Access:  PIV IVF:  off Proph:  heparin  Code Status: full Family Communication: patient and family at bedside Disposition Plan: pending post-op recovery, tolerating diet, on oral antibiotics.  PT/OT   Consultants:  Orthopedic surgery, Dr. Sharol Given  Procedures:  XR right foot  Antibiotics:  Vancomycin 4/24 >  Zosyn 4/24 >  HPI/Subjective:  Post-op, still tired and having pain in his right foot.  Denies SOB, nausea, vomiting.  No recent BM  Objective: Filed Vitals:   10/03/14 1238 10/03/14 1242 10/03/14 1246 10/03/14 1301  BP: 146/83  148/84 129/84  Pulse:  72 71 69  Temp:   98 F (36.7 C) 98 F (36.7 C)  TempSrc:    Oral  Resp:  $Remo'14 16 18  'AUtAc$ Height:      Weight:      SpO2:  100% 100% 100%    Intake/Output Summary (Last 24 hours) at 10/03/14 1431 Last data filed at 10/03/14 1302  Gross per 24 hour  Intake 1562.5 ml  Output    825 ml  Net  737.5 ml   Filed Weights   10/01/14 1339 10/01/14 2150  Weight: 118.752 kg (261 lb 12.8 oz) 115.713 kg (255 lb 1.6 oz)    Exam:   General:  Obese male, No acute distress, nasal  canula removed during exam  HEENT:  NCAT, MMM  Cardiovascular:  RRR, nl S1, S2 no mrg, 2+ pulses, warm extremities  Respiratory:  CTAB, no increased WOB  Abdomen:   NABS, soft, NT/ND  MSK:   Normal tone and bulk, 2+ pitting edema RLE, dressing intact on right foot.   Neuro:  Grossly moves all extremities.    Data Reviewed: Basic Metabolic Panel:  Recent Labs Lab 09/29/14 0655 09/30/14 0655 10/01/14 1800 10/02/14 0604 10/03/14 0648  NA 135 143 132* 133* 136  K 4.2 4.1 4.1 4.2 3.8  CL 100 104 94* 98 102  CO2 $Re'27 26 29  26 26  'gNA$ GLUCOSE 222* 94 294* 329* 154*  BUN $Re'16 22 14 14 15  'Qip$ CREATININE 1.57* 1.03 1.71* 1.61* 1.59*  CALCIUM 8.8 9.4 8.9 8.4 8.5   Liver Function Tests:  Recent Labs Lab 09/27/14 1515 10/02/14 0604  AST 23 25  ALT 44 25  ALKPHOS 105 72  BILITOT 0.8 0.3  PROT 7.7 6.6  ALBUMIN 3.2* 2.2*    Recent Labs Lab 09/27/14 1541  LIPASE 22   No results for input(s): AMMONIA in the last 168 hours. CBC:  Recent Labs Lab 09/27/14 1515 09/28/14 0630 10/01/14 1800 10/02/14 0604 10/03/14 0648  WBC 12.2* 11.6* 10.2 9.9 7.8  NEUTROABS 9.6*  --  7.0  --   --   HGB 12.0* 11.0* 10.1* 10.0* 9.6*  HCT 34.3* 32.7* 30.3* 30.5* 29.4*  MCV 88.2 90.6 89.9 90.5 90.2  PLT 184 168 269 251 270   Cardiac Enzymes: No results for input(s): CKTOTAL, CKMB, CKMBINDEX, TROPONINI in the last 168 hours. BNP (last 3 results)  Recent Labs  05/30/14 0041  BNP 19.2    ProBNP (last 3 results) No results for input(s): PROBNP in the last 8760 hours.  CBG:  Recent Labs Lab 10/02/14 2156 10/03/14 0753 10/03/14 0942 10/03/14 1057 10/03/14 1212  GLUCAP 141* 147* 148* 141* 135*    Recent Results (from the past 240 hour(s))  Culture, blood (routine x 2)     Status: None   Collection Time: 09/27/14  3:05 PM  Result Value Ref Range Status   Specimen Description BLOOD ARM RIGHT  Final   Special Requests BOTTLES DRAWN AEROBIC AND ANAEROBIC 10CC  Final   Culture   Final    NO GROWTH 5 DAYS Performed at [facility]    Report Status 10/03/2014 FINAL  Final  Culture, blood (routine x 2)     Status: None   Collection Time: 09/27/14  3:09 PM  Result Value Ref Range Status   Specimen Description BLOOD RIGHT HAND  Final   Special Requests BOTTLES DRAWN AEROBIC AND ANAEROBIC 5CC  Final   Culture   Final    NO GROWTH 5 DAYS Performed at [facility]    Report Status 10/03/2014 FINAL  Final  Urine culture     Status: None   Collection Time: 09/27/14  6:44 PM  Result Value Ref Range  Status   Specimen Description URINE, RANDOM  Final   Special Requests NONE  Final   Colony Count NO GROWTH Performed at [facility]   Final   Culture NO GROWTH Performed at [facility]   Final   Report Status 09/29/2014 FINAL  Final  Culture, blood (routine x 2)     Status: None (Preliminary result)   Collection Time: 10/01/14  6:00 PM  Result Value Ref Range Status   Specimen Description BLOOD RIGHT ANTECUBITAL  Final   Special Requests BOTTLES DRAWN AEROBIC AND ANAEROBIC 5 CC  Final   Culture   Final           BLOOD CULTURE RECEIVED NO GROWTH TO DATE CULTURE WILL BE HELD FOR 5 DAYS BEFORE ISSUING A FINAL NEGATIVE REPORT Performed at [facility]    Report Status PENDING  Incomplete  Culture, blood (routine x 2)     Status: None (Preliminary result)   Collection Time: 10/01/14  6:21 PM  Result Value Ref Range Status   Specimen Description BLOOD RIGHT ARM  Final   Special Requests BOTTLES DRAWN AEROBIC ONLY 10 CC  Final   Culture   Final           BLOOD CULTURE RECEIVED NO GROWTH TO DATE CULTURE WILL BE HELD FOR 5 DAYS BEFORE ISSUING A FINAL NEGATIVE REPORT Performed at [facility]    Report Status PENDING  Incomplete  Surgical pcr screen     Status: None   Collection Time: 10/03/14  5:35 AM  Result Value Ref Range Status   MRSA, PCR NEGATIVE NEGATIVE Final   Staphylococcus aureus NEGATIVE NEGATIVE Final    Comment:        The Xpert SA Assay (FDA approved for NASAL specimens in patients over 65 years of age), is one component of a comprehensive surveillance program.  Test performance has been validated by [facility] for patients greater than or equal to 56 year old. It is not intended to diagnose infection nor to guide or monitor treatment.      Studies: Dg Foot 2 Views Right  10/01/2014   CLINICAL DATA:  Diabetic foot infection.  Drainage from fifth toe  EXAM: RIGHT FOOT - 2 VIEW  COMPARISON:  11/14/2013  FINDINGS: Amputation  first toe at the level of the metatarsal phalangeal joint. Amputation of the second toe at the level of the distal second metatarsal  Chronic septic arthritis involving the third metatarsal phalangeal joint and interphalangeal joint. Bony proliferative changes and sclerotic change have progressed in the interval.  Soft tissue swelling of the fifth toe. Gas in the soft tissues. Lucency in the distal fifth phalanx suggestive of osteomyelitis.  IMPRESSION: Soft tissue swelling and soft tissue gas in the fifth toe with probable osteomyelitis distal fifth phalanx  Chronic septic arthritis third metatarsal phalangeal joint and interphalangeal joint progressive bony changes since the prior study.   Electronically Signed   By: Franchot Gallo [name]   On: 10/01/2014 17:50    Scheduled Meds: . feeding supplement (PRO-STAT SUGAR FREE 64)  30 mL Oral BID  . gabapentin  200 mg Oral TID  . heparin  5,000 Units Subcutaneous 3 times per day  . HYDROmorphone      . insulin aspart  0-5 Units Subcutaneous QHS  . insulin aspart  0-9 Units Subcutaneous TID WC  . insulin aspart  3 Units Subcutaneous TID WC  . insulin detemir  16 Units Subcutaneous QHS  . lisinopril  10 mg Oral Daily  . nicotine  21 mg Transdermal Daily  . pantoprazole  40 mg Oral BID  . piperacillin-tazobactam (ZOSYN)  IV  3.375 g Intravenous 3 times per day  . vancomycin  750 mg Intravenous Q12H   Continuous Infusions: . sodium chloride    . lactated ringers 50 mL/hr at 10/03/14 1045    Principal Problem:   Diabetic foot ulcer with osteomyelitis Active Problems:   DM (diabetes mellitus) type II uncontrolled, periph vascular disorder  CKD (chronic kidney disease), stage III   Essential hypertension, benign   Esophageal reflux   GERD (gastroesophageal reflux disease)   Right foot infection   Osteomyelitis of foot, right, acute   Diabetic foot ulcer    Time spent: 30 min    Kabella Cassidy, [place] [facility] Pager [phone]. If  7PM-7AM, please contact night-coverage at [email or web address], password [facility] 10/03/2014, 2:31 PM  LOS: 2 days

## 2014-10-03 NOTE — Progress Notes (Signed)
OT Cancellation Note  Patient Details Name: [name] MRN: [id number] DOB: 06/23/1975   Cancelled Treatment:    Reason Eval/Treat Not Completed: Other (comment) Plans for amputation. Will see post op. [facility]  Marita Burnsed, OTR/L  [phone] 10/03/2014 10/03/2014, 7:36 AM

## 2014-10-04 ENCOUNTER — Encounter (HOSPITAL_COMMUNITY): Payer: Self-pay | Admitting: Orthopedic Surgery

## 2014-10-04 LAB — BASIC METABOLIC PANEL
Anion gap: 7 (ref 5–15)
BUN: 14 mg/dL (ref 6–23)
CALCIUM: 8.3 mg/dL — AB (ref 8.4–10.5)
CO2: 28 mmol/L (ref 19–32)
CREATININE: 1.71 mg/dL — AB (ref 0.50–1.35)
Chloride: 98 mmol/L (ref 96–112)
GFR calc Af Amer: 57 mL/min — ABNORMAL LOW (ref >90)
GFR calc non Af Amer: 49 mL/min — ABNORMAL LOW (ref >90)
GLUCOSE: 232 mg/dL — AB (ref 70–99)
Potassium: 4 mmol/L (ref 3.5–5.1)
SODIUM: 133 mmol/L — AB (ref 135–145)

## 2014-10-04 LAB — CBC
HCT: 28.1 % — ABNORMAL LOW (ref 39.0–52.0)
Hemoglobin: 9.2 g/dL — ABNORMAL LOW (ref 13.0–17.0)
MCH: 29.3 pg (ref 26.0–34.0)
MCHC: 32.7 g/dL (ref 30.0–36.0)
MCV: 89.5 fL (ref 78.0–100.0)
PLATELETS: 294 10*3/uL (ref 150–400)
RBC: 3.14 MIL/uL — AB (ref 4.22–5.81)
RDW: 12.9 % (ref 11.5–15.5)
WBC: 7.7 10*3/uL (ref 4.0–10.5)

## 2014-10-04 LAB — GLUCOSE, CAPILLARY
GLUCOSE-CAPILLARY: 240 mg/dL — AB (ref 70–99)
GLUCOSE-CAPILLARY: 211 mg/dL — AB (ref 70–99)
Glucose-Capillary: 264 mg/dL — ABNORMAL HIGH (ref 70–99)
Glucose-Capillary: 191 mg/dL — ABNORMAL HIGH (ref 70–99)

## 2014-10-04 MED ORDER — BISACODYL 10 MG RE SUPP: 10.0000 mg | Freq: Once | RECTAL | Status: DC

## 2014-10-04 MED ORDER — POLYETHYLENE GLYCOL 3350 17 G PO PACK
17.0000 g | PACK | Freq: Two times a day (BID) | ORAL | Status: DC
  Administered 2014-10-04 – 2014-10-05 (×3): 17 g via ORAL
  Filled 2014-10-04 (×4): qty 1

## 2014-10-04 MED ORDER — AMLODIPINE BESYLATE 2.5 MG PO TABS
2.5000 mg | ORAL_TABLET | Freq: Every day | ORAL | Status: DC
  Administered 2014-10-04 – 2014-10-05 (×2): 2.5 mg via ORAL
  Filled 2014-10-04 (×4): qty 1

## 2014-10-04 NOTE — Progress Notes (Signed)
ANTIBIOTIC CONSULT NOTE - Follow-up  Pharmacy Consult for vancomycin + zosyn Indication: osteomyeltitis with surrounding cellulitis  No Known Allergies  Patient Measurements: Height:  (185.4 cm) Weight: 256 lb 13.4 oz (116.5 kg) IBW/kg (Calculated) : 79.9  Vital Signs: Temp: 98.3 F (36.8 C) (04/27 0446) Temp Source: Oral (04/27 0446) BP: 115/63 mmHg (04/27 0446) Pulse Rate: 72 (04/27 0446) Intake/Output from previous day: 04/26 0701 - 04/27 0700 In: 980 [P.O.:480; I.V.:500] Out: 2500 [Urine:2500] Intake/Output from this shift:    Labs:  Recent Labs  10/01/14 1800 10/02/14 0604 10/03/14 0648 10/04/14 0710  WBC 10.2 9.9 7.8 7.7  HGB 10.1* 10.0* 9.6* 9.2*  PLT 269 251 270 294  CREATININE 1.71* 1.61* 1.59*  --    Estimated Creatinine Clearance: 85 mL/min (by C-G formula based on Cr of 1.59). No results for input(s): VANCOTROUGH, VANCOPEAK, VANCORANDOM, GENTTROUGH, GENTPEAK, GENTRANDOM, TOBRATROUGH, TOBRAPEAK, TOBRARND, AMIKACINPEAK, AMIKACINTROU, AMIKACIN in the last 72 hours.   Microbiology: Recent Results (from the past 720 hour(s))  Culture, blood (routine x 2)     Status: None   Collection Time: 09/27/14  3:05 PM  Result Value Ref Range Status   Specimen Description BLOOD ARM RIGHT  Final   Special Requests BOTTLES DRAWN AEROBIC AND ANAEROBIC 10CC  Final   Culture   Final    NO GROWTH 5 DAYS Performed at [facility]    Report Status 10/03/2014 FINAL  Final  Culture, blood (routine x 2)     Status: None   Collection Time: 09/27/14  3:09 PM  Result Value Ref Range Status   Specimen Description BLOOD RIGHT HAND  Final   Special Requests BOTTLES DRAWN AEROBIC AND ANAEROBIC 5CC  Final   Culture   Final    NO GROWTH 5 DAYS Performed at [facility]    Report Status 10/03/2014 FINAL  Final  Urine culture     Status: None   Collection Time: 09/27/14  6:44 PM  Result Value Ref Range Status   Specimen Description URINE, RANDOM  Final   Special Requests NONE  Final   Colony Count NO GROWTH Performed at [facility]   Final   Culture NO GROWTH Performed at [facility]   Final   Report Status 09/29/2014 FINAL  Final  Culture, blood (routine x 2)     Status: None (Preliminary result)   Collection Time: 10/01/14  6:00 PM  Result Value Ref Range Status   Specimen Description BLOOD RIGHT ANTECUBITAL  Final   Special Requests BOTTLES DRAWN AEROBIC AND ANAEROBIC 5 CC  Final   Culture   Final           BLOOD CULTURE RECEIVED NO GROWTH TO DATE CULTURE WILL BE HELD FOR 5 DAYS BEFORE ISSUING A FINAL NEGATIVE REPORT Performed at [facility]    Report Status PENDING  Incomplete  Culture, blood (routine x 2)     Status: None (Preliminary result)   Collection Time: 10/01/14  6:21 PM  Result Value Ref Range Status   Specimen Description BLOOD RIGHT ARM  Final   Special Requests BOTTLES DRAWN AEROBIC ONLY 10 CC  Final   Culture   Final           BLOOD CULTURE RECEIVED NO GROWTH TO DATE CULTURE WILL BE HELD FOR 5 DAYS BEFORE ISSUING A FINAL NEGATIVE REPORT Performed at [facility]    Report Status PENDING  Incomplete  Surgical pcr screen     Status:  None   Collection Time: 10/03/14  5:35 AM  Result Value Ref Range Status   MRSA, PCR NEGATIVE NEGATIVE Final   Staphylococcus aureus NEGATIVE NEGATIVE Final    Comment:        The Xpert SA Assay (FDA approved for NASAL specimens in patients over 38 years of age), is one component of a comprehensive surveillance program.  Test performance has been validated by [facility] for patients greater than or equal to 74104 year old. It is not intended to diagnose infection nor to guide or monitor treatment.     Assessment: [name] with hx of uncontrolled DM readmitted today for R foot pain. He was started on empiric vancomycin + zosyn for osteomyelitis. S/p midfoot amputation 4/26 but will need 7 days of antibiotics for associated cellulitis. Today is  D#4. Pt is afebrile and WBC is WNL. SCr is 1.59 as of yesterday so doses are appropriate.   Vanc 4/24>> Zosyn 4/24>>  4/24 Blood - NGTD  Goal of Therapy:  Vancomycin trough level 15-20 mcg/ml  Plan:  - Continue vanc 750mg  IV Q12H - Continue zosyn 3.375gm IV Q8H (4 hr inf) - F/u renal fxn, C&S, clinical status [facility] - Consider de-escalating antibiotics to complete course of therapy  Lysle Pearlachel Lavert Matousek, PharmD, BCPS Pager # [phone] 10/04/2014 8:40 AM

## 2014-10-04 NOTE — Evaluation (Signed)
Occupational Therapy Evaluation Patient Details Name: [name] MRN: [id number] DOB: 10/28/1975 Today's Date: 10/04/2014    History of Present Illness S/P R midfoot amputation due to non healing wound/osteomyelitis. PMH: uncontrolled DM, CKD, HTN, tobacco abuse.   Clinical Impression   Pt was independent in self care and ambulated with a cane prior to admission.  He presents with decreased balance with some difficulty maintaining NWB status with transfers and ambulation. Will follow acutely. Do not anticipate pt will need any post acute OT.    Follow Up Recommendations  No OT follow up;Supervision - Intermittent    Equipment Recommendations  Tub/shower bench    Recommendations for Other Services       Precautions / Restrictions Precautions Precautions: Fall Required Braces or Orthoses: Other Brace/Splint Other Brace/Splint: post op shoe Restrictions Weight Bearing Restrictions: Yes RLE Weight Bearing: Non weight bearing      Mobility Bed Mobility  Modified independent                Transfers Overall transfer level: Needs assistance Equipment used: Crutches;Rolling walker (2 wheeled) Transfers: Sit to/from Stand Sit to Stand: Min guard (with and without physical contact)         General transfer comment: Noted good rise, but unsteady and touching R foot down for balance    Balance Overall balance assessment: Needs assistance           Standing balance-Leahy Scale: Poor Standing balance comment: decr balance, with heavy dependence on UE support while NWB RLE                            ADL Overall ADL's : Needs assistance/impaired Eating/Feeding: Independent;Sitting   Grooming: Wash/dry face;Wash/dry hands;Sitting;Set up   Upper Body Bathing: Min guard;Sitting   Lower Body Bathing: Min guard;Sit to/from stand   Upper Body Dressing : Set up;Sitting   Lower Body Dressing: Min guard;Sit to/from stand Lower Body Dressing Details  (indicate cue type and reason): able to donn and doff L sock and R post op shoe             Functional mobility during ADLs: Min guard;Rolling walker;Cueing for safety (crutches, cues for NWB on R LE)       Vision     Perception     Praxis      Pertinent Vitals/Pain Pain Assessment: 0-10 Pain Score: 7  Pain Location: R foot Pain Descriptors / Indicators: Aching;Throbbing Pain Intervention(s): Limited activity within patient's tolerance;Monitored during session;Repositioned;Premedicated before session     Hand Dominance Left   Extremity/Trunk Assessment Upper Extremity Assessment Upper Extremity Assessment: Defer to OT evaluation;Overall WFL for tasks assessed   Lower Extremity Assessment Lower Extremity Assessment: RLE deficits/detail RLE Deficits / Details: Painful with moving; hip and knee WFL   Cervical / Trunk Assessment Cervical / Trunk Assessment: Normal   Communication Communication Communication: No difficulties   Cognition Arousal/Alertness: Awake/alert Behavior During Therapy: WFL for tasks assessed/performed Overall Cognitive Status: Within Functional Limits for tasks assessed                     General Comments       Exercises       Shoulder Instructions      Home Living Family/patient expects to be discharged to:: Private residence Living Arrangements: Parent;Other (Comment) (and brother) Available Help at Discharge: Family;Available PRN/intermittently Type of Home: House Home Access: Ramped entrance     Home Layout:  One level     Bathroom Shower/Tub: Tub/shower unit Shower/tub characteristics: Engineer, building services: Standard Bathroom Accessibility: Yes How Accessible: Accessible via wheelchair Home Equipment: Cane - single point          Prior Functioning/Environment Level of Independence: Independent with assistive device(s)        Comments: walked with a cane    OT Diagnosis: Generalized weakness;Acute  pain   OT Problem List: Decreased strength;Decreased activity tolerance;Impaired balance (sitting and/or standing);Decreased knowledge of use of DME or AE;Decreased knowledge of precautions;Pain   OT Treatment/Interventions: Self-care/ADL training;DME and/or AE instruction;Patient/family education;Balance training    OT Goals(Current goals can be found in the care plan section) Acute Rehab OT Goals Patient Stated Goal: Hopes to get home soon OT Goal Formulation: With patient Time For Goal Achievement: 10/11/14 Potential to Achieve Goals: Good ADL Goals Pt Will Perform Grooming: with modified independence;standing Pt Will Transfer to Toilet: with modified independence;ambulating;regular height toilet (determine need for 3 in1 vs riser) Pt Will Perform Toileting - Clothing Manipulation and hygiene: with modified independence;sit to/from stand Additional ADL Goal #1: Pt will maintain NWB on R LE during ADL and ADL transfers.  OT Frequency: Min 2X/week   Barriers to D/C:            Co-evaluation PT/OT/SLP Co-Evaluation/Treatment: Yes Reason for Co-Treatment: For patient/therapist safety PT goals addressed during session: Mobility/safety with mobility;Balance;Proper use of DME OT goals addressed during session: ADL's and self-care      End of Session Nurse Communication: Mobility status  Activity Tolerance: Patient tolerated treatment well Patient left: in chair;with call bell/phone within reach   Time: 1050-1117 OT Time Calculation (min): 27 min Charges:  OT General Charges $OT Visit: 1 Procedure OT Evaluation $Initial OT Evaluation Tier I: 1 Procedure G-Codes:    Evern Bio 10/04/2014, 12:32 PM  [phone]

## 2014-10-04 NOTE — Progress Notes (Signed)
[facility] PROGRESS NOTE  [name] MRN:[id number] DOB: 02/24/1976 DOA: 10/01/2014 PCP: AVBUERE,EDWIN A, MD  Brief Summary  [name] is a 39 y.o. male with past medical history poorly controlled diabetes (recent A1c 13.6), GERD, tobacco abuse, PVD, chronic kidney disease-stage III, history of pancreatitis, gastroparesis, history of collagen vascular disease, history of diabetic foot osteomyelitis (post status of 4 toe amputation), who presented with right foot pain.  He was hospitalized from 4/20 to 09/30/14 with fever and generalized weakness and was felt to have a viral infection or viral gastroenteritis. He also had acute on chronic renal injury with kidney function back to baseline at the time of discharge.  He presented with increasing right foot pain with clear drainage that turned brown.   X-ray of right foot showed possible osteomyelitis in right 5th toe.  ESR > 140.  He was started on vancomycin and zosyn.  Dr. Duda performed mid-foot amputation on 4/26.     Assessment/Plan  Osteomyelitis in right 5th toe with surrounding cellulitis:  No sepsis. Hemodynamically stable.  - continue  with vancomycin and zosyn  - Blood cultures NGTD - ESR >140 and CRP 11.7 - Dr. Duda performed mid-foot amputation on 4/26 -discussed with Dr Duda continue with IV antibiotics today. Transition to oral doxy 4-28 for 2 weeks.   Diabetes mellitus type 2, uncontrolled with chronic kidney disease: A1c was 13.6 on 09/28/14. Very poorly controlled.  -  Supposed to take insulin 70/30 mix, 12 units twice a day, but he has not been taken recently. - continue to hold humulin 70/30, 12 units twice a day = 24 units TDI -  Continue levemir 16 units qhs -  Continue SSI with aspart 3 units AC and HS insulin -will ask care management if patient can afford Lantus, Levemir.   Acute blood loss anemia; post surgery. Expected. Follow trend.   Constipation; miralax. Dulcolax suppository.   Chronic kidney  disease-3, baseline creatinine 1.5-2.0. His creatinine is 1.71 on admission, which is at baseline. Hold lisinopril post op.   Hypertension, BP elevated - hold lisinopril post op.  -start low dose Norvasc.   Tobacco abuse: -Did counseling about importance of quitting smoking -nicotine patch  Gerd:  Stable, continue protonix -  Consider changing to H2 blocker given CKD  Pseudohyponatremia from hyperglycemia  Diet:  diabetic Access:  PIV IVF:  off Proph:  heparin  Code Status: full Family Communication: discussed care with patient.  Disposition Plan: discharge 4-28 if stable.    Consultants:  Orthopedic surgery, Dr. Duda  Procedures:  XR right foot  Antibiotics:  Vancomycin 4/24 >  Zosyn 4/24 >  HPI/Subjective: Feeling well. No BM since 23. Passing gas. Denies abdominal pain.  Right foot pain controlled.   Objective: Filed Vitals:   10/03/14 1712 10/03/14 2148 10/04/14 0446 10/04/14 1000  BP: 135/85 147/93 115/63 150/93  Pulse: 75 79 72 72  Temp: 98.7 F (37.1 C) 98.8 F (37.1 C) 98.3 F (36.8 C) 97.5 F (36.4 C)  TempSrc: Oral Oral Oral Oral  Resp: 18 17 17 18  Height:  6' 1" (1.854 m)    Weight:  116.5 kg (256 lb 13.4 oz)    SpO2: 98% 99% 99% 98%    Intake/Output Summary (Last 24 hours) at 10/04/14 1033 Last data filed at 10/04/14 0900  Gross per 24 hour  Intake   1220 ml  Output   2500 ml  Net  -1280 ml   Filed Weights   10/01/14 1339 10/01/14 2150   10/03/14 2148  Weight: 118.752 kg (261 lb 12.8 oz) 115.713 kg (255 lb 1.6 oz) 116.5 kg (256 lb 13.4 oz)    Exam:   General:  Obese male, No acute distress.   Cardiovascular:  RRR, nl S1, S2 no mrg.   Respiratory:  CTAB, no increased WOB  Abdomen:   NABS, soft, NT/ND  MSK: right foot with clean dressing.    Data Reviewed: Basic Metabolic Panel:  Recent Labs Lab 09/30/14 0655 10/01/14 1800 10/02/14 0604 10/03/14 0648 10/04/14 0710  NA 143 132* 133* 136 133*  K 4.1 4.1 4.2 3.8  4.0  CL 104 94* 98 102 98  CO2 _0 GLUCOSE 94 294* 329* 154* 232*  BUN _1 CREATININE 1.03 1.71* 1.61* 1.59* 1.71*  CALCIUM 9.4 8.9 8.4 8.5 8.3*   Liver Function Tests:  Recent Labs Lab 09/27/14 1515 10/02/14 0604  AST 23 25  ALT 44 25  ALKPHOS 105 72  BILITOT 0.8 0.3  PROT 7.7 6.6  ALBUMIN 3.2* 2.2*    Recent Labs Lab 09/27/14 1541  LIPASE 22   No results for input(s): AMMONIA in the last 168 hours. CBC:  Recent Labs Lab 09/27/14 1515 09/28/14 0630 10/01/14 1800 10/02/14 0604 10/03/14 0648 10/04/14 0710  WBC 12.2* 11.6* 10.2 9.9 7.8 7.7  NEUTROABS 9.6*  --  7.0  --   --   --   HGB 12.0* 11.0* 10.1* 10.0* 9.6* 9.2*  HCT 34.3* 32.7* 30.3* 30.5* 29.4* 28.1*  MCV 88.2 90.6 89.9 90.5 90.2 89.5  PLT 184 168 269 251 270 294   Cardiac Enzymes: No results for input(s): CKTOTAL, CKMB, CKMBINDEX, TROPONINI in the last 168 hours. BNP (last 3 results)  Recent Labs  05/30/14 0041  BNP 19.2    ProBNP (last 3 results) No results for input(s): PROBNP in the last 8760 hours.  CBG:  Recent Labs Lab 10/03/14 1057 10/03/14 1212 10/03/14 1640 10/03/14 2147 10/04/14 0920  GLUCAP 141* 135* 216* 184* 264*    Recent Results (from the past 240 hour(s))  Culture, blood (routine x 2)     Status: None   Collection Time: 09/27/14  3:05 PM  Result Value Ref Range Status   Specimen Description BLOOD ARM RIGHT  Final   Special Requests BOTTLES DRAWN AEROBIC AND ANAEROBIC 10CC  Final   Culture   Final    NO GROWTH 5 DAYS Performed at [facility]    Report Status 10/03/2014 FINAL  Final  Culture, blood (routine x 2)     Status: None   Collection Time: 09/27/14  3:09 PM  Result Value Ref Range Status   Specimen Description BLOOD RIGHT HAND  Final   Special Requests BOTTLES DRAWN AEROBIC AND ANAEROBIC 5CC  Final   Culture   Final    NO GROWTH 5 DAYS Performed at [facility]    Report Status 10/03/2014 FINAL  Final  Urine  culture     Status: None   Collection Time: 09/27/14  6:44 PM  Result Value Ref Range Status   Specimen Description URINE, RANDOM  Final   Special Requests NONE  Final   Colony Count NO GROWTH Performed at [facility]   Final   Culture NO GROWTH Performed at [facility]   Final   Report Status 09/29/2014 FINAL  Final  Culture, blood (routine x 2)     Status: None (Preliminary result)   Collection Time: 10/01/14  6:00 PM  Result Value Ref Range Status   Specimen Description BLOOD RIGHT ANTECUBITAL  Final   Special Requests BOTTLES DRAWN AEROBIC AND ANAEROBIC 5 CC  Final   Culture   Final           BLOOD CULTURE RECEIVED NO GROWTH TO DATE CULTURE WILL BE HELD FOR 5 DAYS BEFORE ISSUING A FINAL NEGATIVE REPORT Performed at [facility]    Report Status PENDING  Incomplete  Culture, blood (routine x 2)     Status: None (Preliminary result)   Collection Time: 10/01/14  6:21 PM  Result Value Ref Range Status   Specimen Description BLOOD RIGHT ARM  Final   Special Requests BOTTLES DRAWN AEROBIC ONLY 10 CC  Final   Culture   Final           BLOOD CULTURE RECEIVED NO GROWTH TO DATE CULTURE WILL BE HELD FOR 5 DAYS BEFORE ISSUING A FINAL NEGATIVE REPORT Performed at [facility]    Report Status PENDING  Incomplete  Surgical pcr screen     Status: None   Collection Time: 10/03/14  5:35 AM  Result Value Ref Range Status   MRSA, PCR NEGATIVE NEGATIVE Final   Staphylococcus aureus NEGATIVE NEGATIVE Final    Comment:        The Xpert SA Assay (FDA approved for NASAL specimens in patients over 21 years of age), is one component of a comprehensive surveillance program.  Test performance has been validated by [facility] for patients greater than or equal to 1 year old. It is not intended to diagnose infection nor to guide or monitor treatment.      Studies: No results found.  Scheduled Meds: . bisacodyl  10 mg Rectal Once  . docusate sodium   100 mg Oral BID  . feeding supplement (PRO-STAT SUGAR FREE 64)  30 mL Oral BID  . gabapentin  200 mg Oral TID  . heparin  5,000 Units Subcutaneous 3 times per day  . insulin aspart  0-5 Units Subcutaneous QHS  . insulin aspart  0-9 Units Subcutaneous TID WC  . insulin aspart  3 Units Subcutaneous TID WC  . insulin detemir  16 Units Subcutaneous QHS  . nicotine  21 mg Transdermal Daily  . pantoprazole  40 mg Oral BID  . piperacillin-tazobactam (ZOSYN)  IV  3.375 g Intravenous 3 times per day  . polyethylene glycol  17 g Oral BID  . senna  2 tablet Oral QHS  . vancomycin  750 mg Intravenous Q12H   Continuous Infusions: . sodium chloride      Principal Problem:   Diabetic foot ulcer with osteomyelitis Active Problems:   DM (diabetes mellitus) type II uncontrolled, periph vascular disorder   CKD (chronic kidney disease), stage III   Essential hypertension, benign   Esophageal reflux   GERD (gastroesophageal reflux disease)   Right foot infection   Osteomyelitis of foot, right, acute   Diabetic foot ulcer    Time spent: 30 min    Regalado, Belkys A MD [facility] Pager [phone]. If 7PM-7AM, please contact night-coverage at [email or web address], password [email or web address] 10/04/2014, 10:33 AM  LOS: 3 days              

## 2014-10-04 NOTE — Evaluation (Signed)
Physical Therapy Evaluation Patient Details Name: Curtis FettersLamont Clark MRN: [id number] DOB: 02/03/1976 Today's Date: 10/04/2014   History of Present Illness  S/P R midfoot amputation due to non healing wound/osteomyelitis. PMH: uncontrolled DM, CKD, HTN, tobacco abuse.  Clinical Impression  Patient is s/p above surgery resulting in functional limitations due to the deficits listed below (see PT Problem List).   On eval, pt with difficulty keeping NWB RLE; Better maintenance of NWB with RW; still, he is young, and I anticipate good progress with practice;  [place] considering sending crutches home with him as well as the RW;   Patient will benefit from skilled PT to increase their independence and safety with mobility to allow discharge to the venue listed below.       Follow Up Recommendations Outpatient PT  The potential need for Outpatient PT can be addressed at Ortho follow-up appointments.     Equipment Recommendations  Rolling walker with 5" wheels;Other (comment) (perhaps crutches or knee walker; TBD)    Recommendations for Other Services OT consult     Precautions / Restrictions Precautions Precautions: Fall Required Braces or Orthoses: Other Brace/Splint Other Brace/Splint: post op shoe Restrictions Weight Bearing Restrictions: Yes RLE Weight Bearing: Non weight bearing      Mobility  Bed Mobility                  Transfers Overall transfer level: Needs assistance Equipment used: Crutches;Rolling walker (2 wheeled) Transfers: Sit to/from Stand Sit to Stand: Min guard (with and without physical contact)         General transfer comment: Noted good rise, but unsteady and touching R foot down for balance  Ambulation/Gait Ambulation/Gait assistance: Min assist;Min guard Ambulation Distance (Feet): 15 Feet (x2) Assistive device: Crutches;Rolling walker (2 wheeled) Gait Pattern/deviations: Step-to pattern     General Gait Details: Difficulty maintaining R NWB  status during amb mostly due to unsteadiness and tendency to touch down RLE; Improved with use of rW, the more stable assistive device, but still occasional touchdown  Stairs            Wheelchair Mobility    Modified Rankin (Stroke Patients Only)       Balance Overall balance assessment: Needs assistance           Standing balance-Leahy Scale: Poor Standing balance comment: decr balance, with heavy dependence on UE support while NWB RLE                             Pertinent Vitals/Pain Pain Assessment: 0-10 Pain Score: 7  Pain Location: R foot Pain Descriptors / Indicators: Aching;Throbbing Pain Intervention(s): Limited activity within patient's tolerance;Monitored during session;Repositioned;Premedicated before session    Home Living Family/patient expects to be discharged to:: Private residence Living Arrangements: Parent;Other (Comment) (and brother) Available Help at Discharge: Family;Available PRN/intermittently Type of Home: House Home Access: Ramped entrance     Home Layout: One level Home Equipment: Cane - single point      Prior Function Level of Independence: Independent with assistive device(s)         Comments: walked with a cane     Hand Dominance   Dominant Hand: Left    Extremity/Trunk Assessment   Upper Extremity Assessment: Defer to OT evaluation;Overall WFL for tasks assessed           Lower Extremity Assessment: RLE deficits/detail RLE Deficits / Details: Painful with moving; hip and knee WFL    Cervical /  Trunk Assessment: Normal  Communication   Communication: No difficulties  Cognition Arousal/Alertness: Awake/alert Behavior During Therapy: WFL for tasks assessed/performed Overall Cognitive Status: Within Functional Limits for tasks assessed                      General Comments      Exercises        Assessment/Plan    PT Assessment Patient needs continued PT services  PT Diagnosis  Difficulty walking;Acute pain   PT Problem List Decreased strength;Decreased range of motion;Decreased activity tolerance;Decreased balance;Decreased mobility;Decreased knowledge of use of DME;Decreased knowledge of precautions;Pain  PT Treatment Interventions DME instruction;Gait training;Functional mobility training;Therapeutic activities;Therapeutic exercise;Patient/family education   PT Goals (Current goals can be found in the Care Plan section) Acute Rehab PT Goals Patient Stated Goal: Hopes to get home soon PT Goal Formulation: With patient Time For Goal Achievement: 10/18/14 Potential to Achieve Goals: Good    Frequency Min 5X/week   Barriers to discharge        Co-evaluation PT/OT/SLP Co-Evaluation/Treatment: Yes Reason for Co-Treatment: For patient/therapist safety PT goals addressed during session: Mobility/safety with mobility;Balance;Proper use of DME OT goals addressed during session: ADL's and self-care       End of Session Equipment Utilized During Treatment: Gait belt Activity Tolerance: Patient tolerated treatment well Patient left: in chair;with call bell/phone within reach Nurse Communication: Mobility status         Time: 1049-1119 (minus approx 10 minutes getting crutches) PT Time Calculation (min) (ACUTE ONLY): 30 min   Charges:   PT Evaluation $Initial PT Evaluation Tier I: 1 Procedure     PT G CodesVan Clines Hamff 10/04/2014, 11:31 AM

## 2014-10-04 NOTE — Care Management (Signed)
Pt insurance covers Lantus with $2.95 copay for 30 day supply. CRoyal RN MPH, case manager, [phone]

## 2014-10-05 LAB — BASIC METABOLIC PANEL
Anion gap: 7 (ref 5–15)
BUN: 14 mg/dL (ref 6–23)
CALCIUM: 8.8 mg/dL (ref 8.4–10.5)
CO2: 29 mmol/L (ref 19–32)
CREATININE: 1.79 mg/dL — AB (ref 0.50–1.35)
Chloride: 99 mmol/L (ref 96–112)
GFR calc Af Amer: 54 mL/min — ABNORMAL LOW (ref >90)
GFR, EST NON AFRICAN AMERICAN: 47 mL/min — AB (ref >90)
Glucose, Bld: 216 mg/dL — ABNORMAL HIGH (ref 70–99)
Potassium: 4 mmol/L (ref 3.5–5.1)
Sodium: 135 mmol/L (ref 135–145)

## 2014-10-05 LAB — CBC
HCT: 31 % — ABNORMAL LOW (ref 39.0–52.0)
Hemoglobin: 10.1 g/dL — ABNORMAL LOW (ref 13.0–17.0)
MCH: 29.4 pg (ref 26.0–34.0)
MCHC: 32.6 g/dL (ref 30.0–36.0)
MCV: 90.4 fL (ref 78.0–100.0)
PLATELETS: 315 10*3/uL (ref 150–400)
RBC: 3.43 MIL/uL — ABNORMAL LOW (ref 4.22–5.81)
RDW: 13 % (ref 11.5–15.5)
WBC: 8.1 10*3/uL (ref 4.0–10.5)

## 2014-10-05 LAB — GLUCOSE, CAPILLARY
Glucose-Capillary: 189 mg/dL — ABNORMAL HIGH (ref 70–99)
Glucose-Capillary: 206 mg/dL — ABNORMAL HIGH (ref 70–99)

## 2014-10-05 MED ORDER — DOCUSATE SODIUM 100 MG PO CAPS: 100.0000 mg | ORAL_CAPSULE | Freq: Two times a day (BID) | ORAL | Status: DC

## 2014-10-05 MED ORDER — AMLODIPINE BESYLATE 2.5 MG PO TABS: 2.5000 mg | ORAL_TABLET | Freq: Every day | ORAL | Status: DC

## 2014-10-05 MED ORDER — POLYETHYLENE GLYCOL 3350 17 G PO PACK: 17.0000 g | PACK | Freq: Two times a day (BID) | ORAL | Status: DC

## 2014-10-05 MED ORDER — DOXYCYCLINE HYCLATE 100 MG PO TABS: 100.0000 mg | ORAL_TABLET | Freq: Two times a day (BID) | ORAL | Status: DC

## 2014-10-05 MED ORDER — INSULIN ASPART 100 UNIT/ML ~~LOC~~ SOLN
3.0000 [IU] | Freq: Three times a day (TID) | SUBCUTANEOUS | Status: DC
Start: 2014-10-05 — End: 2016-04-21

## 2014-10-05 MED ORDER — PRO-STAT SUGAR FREE PO LIQD: 30.0000 mL | Freq: Two times a day (BID) | ORAL | Status: DC

## 2014-10-05 MED ORDER — HYDROCODONE-ACETAMINOPHEN 5-325 MG PO TABS: 1.0000 | ORAL_TABLET | Freq: Four times a day (QID) | ORAL | Status: DC | PRN

## 2014-10-05 MED ORDER — INSULIN GLARGINE 100 UNIT/ML ~~LOC~~ SOLN: 15.0000 [IU] | Freq: Every day | SUBCUTANEOUS | Status: DC

## 2014-10-05 NOTE — Care Management (Signed)
CARE MANAGEMENT NOTE 10/05/2014  Patient:  [name],[name]   Account Number:  [id number]  Date Initiated:  10/05/2014  Documentation initiated by:  Harvin Konicek  Subjective/Objective Assessment:   CM following pt since adm for progression and d/c planning.     Action/Plan:   10/05/2014 Pt eval for d/c needs no HH services recommended by PT eval. Pt requesting crutches only ,   Anticipated DC Date:  10/05/2014   Anticipated DC Plan:  HOME/SELF CARE         Choice offered to / List presented to:             Status of service:  Completed, signed off Medicare Important Message given?  NO (If response is "NO", the following Medicare IM given date fields will be blank) Date Medicare IM given:   Medicare IM given by:   Date Additional Medicare IM given:   Additional Medicare IM given by:    Discharge Disposition:  HOME/SELF CARE  Per UR Regulation:    If discussed at Long Length of Stay Meetings, dates discussed:    Comments:  10/05/14 Pt requesting crutches only, these are at bedside, pt declined walker.  CRoyal RN MPH, case Production designer, theatre/television/filmmanager. Per MD request this CM checked pt copay for Lantus, this will be $2.95 , MD informed. CRoyal RN MPH

## 2014-10-05 NOTE — Discharge Summary (Signed)
Physician Discharge Summary  [name] CZY:[id number] DOB: 1975-09-12 DOA: 10/01/2014  PCP: [name], MD  Admit date: 10/01/2014 Discharge date: 10/05/2014  Time spent: 35 minutes  Recommendations for Outpatient Follow-up:  1. Needs B-met to follow renal function. Resume ACE if renal function stable.  2. Needs further adjustment of insulin regimen.  3. Follow up with Dr Sharol Given pot op.   Discharge Diagnoses:    Diabetic foot ulcer with osteomyelitis   DM (diabetes mellitus) type II uncontrolled, periph vascular disorder   CKD (chronic kidney disease), stage III   Essential hypertension, benign   Esophageal reflux   GERD (gastroesophageal reflux disease)   Right foot infection   Osteomyelitis of foot, right, acute   Diabetic foot ulcer   Discharge Condition: stable.   Diet recommendation: Carb modified.   Filed Weights   10/01/14 2150 10/03/14 2148 10/04/14 2100  Weight: 115.713 kg (255 lb 1.6 oz) 116.5 kg (256 lb 13.4 oz) 119.704 kg (263 lb 14.4 oz)    History of present illness:  [name] is a 39 y.o. male with past medical history poorly controlled diabetes (recent A1c 13.6), GERD, tobacco abuse, PVD, chronic kidney disease-stage III, history of pancreatitis, gastroparesis, history of collagen vascular disease, history of diabetic foot osteomyelitis (post status of 4 toe amputation), who presented with right foot pain. He was hospitalized from 4/20 to 09/30/14 with fever and generalized weakness and was felt to have a viral infection or viral gastroenteritis. He also had acute on chronic renal injury with kidney function back to baseline at the time of discharge. He presented with increasing right foot pain with clear drainage that turned brown. X-ray of right foot showed possible osteomyelitis in right 5th toe. ESR > 140. He was started on vancomycin and zosyn. Dr. Sharol Given performed mid-foot amputation on 4/26.    Hospital Course:    Assessment/Plan  Osteomyelitis in right 5th toe with surrounding cellulitis: No sepsis. Hemodynamically stable.  - continue with vancomycin and zosyn  - Blood cultures NGTD - ESR >140 and CRP 11.7 - Dr. Sharol Given performed mid-foot amputation on 4/26 -discussed with Dr Sharol Given continue with IV antibiotics 24 hour post op. . Transition to oral doxy 4-28 for 2 weeks.   Diabetes mellitus type 2, uncontrolled with chronic kidney disease: A1c was 13.6 on 09/28/14. Very poorly controlled.  - Supposed to take insulin 70/30 mix, 12 units twice a day, but he has not been taken recently. - continue to hold humulin 70/30, 12 units twice a day = 24 units TDI - Continue levemir 16 units qhs - Continue SSI with aspart 3 units AC and HS insulin *Discharge on Lantus.   Acute blood loss anemia; post surgery. Expected. Follow trend. hb stable/   Constipation; miralax. Dulcolax suppository.   Chronic kidney disease-3, baseline creatinine 1.5-2.0. His creatinine is 1.71 on admission, which is at baseline. Hold lisinopril post op. Need repeat renal function outpatient.   Hypertension, BP elevated - hold lisinopril post op.  -start low dose Norvasc.   Tobacco abuse: -Did counseling about importance of quitting smoking -nicotine patch  Gerd: Stable, continue protonix - Consider changing to H2 blocker given CKD  Pseudohyponatremia from hyperglycemia  Procedures: right transmetatarsal amputation  Consultations:  Dr Arlys John.   Discharge Exam: Filed Vitals:   10/05/14 0926  BP: 137/83  Pulse: 77  Temp: 98.7 F (37.1 C)  Resp: 14    General: Alert in no distress.  Cardiovascular: S 1, S 2 RRR Respiratory: CTA  Discharge  Instructions   Discharge Instructions    Diet Carb Modified    Complete by:  As directed      Increase activity slowly    Complete by:  As directed           Current Discharge Medication List    START taking these medications   Details  Amino  Acids-Protein Hydrolys (FEEDING SUPPLEMENT, PRO-STAT SUGAR FREE 64,) LIQD Take 30 mLs by mouth 2 (two) times daily. Qty: 900 mL, Refills: 0    amLODipine (NORVASC) 2.5 MG tablet Take 1 tablet (2.5 mg total) by mouth daily. Qty: 30 tablet, Refills: 0    docusate sodium (COLACE) 100 MG capsule Take 1 capsule (100 mg total) by mouth 2 (two) times daily. Qty: 10 capsule, Refills: 0    doxycycline (VIBRA-TABS) 100 MG tablet Take 1 tablet (100 mg total) by mouth 2 (two) times daily. Qty: 28 tablet, Refills: 0    insulin aspart (NOVOLOG) 100 UNIT/ML injection Inject 3 Units into the skin 3 (three) times daily with meals. Qty: 10 mL, Refills: 11    insulin glargine (LANTUS) 100 UNIT/ML injection Inject 0.15 mLs (15 Units total) into the skin at bedtime. Qty: 10 mL, Refills: 11    polyethylene glycol (MIRALAX / GLYCOLAX) packet Take 17 g by mouth 2 (two) times daily. Qty: 14 each, Refills: 0      CONTINUE these medications which have CHANGED   Details  HYDROcodone-acetaminophen (NORCO/VICODIN) 5-325 MG per tablet Take 1 tablet by mouth every 6 (six) hours as needed for severe pain. Qty: 30 tablet, Refills: 0      CONTINUE these medications which have NOT CHANGED   Details  gabapentin (NEURONTIN) 100 MG capsule Take 2 capsules (200 mg total) by mouth 3 (three) times daily. Qty: 90 capsule, Refills: 0    blood glucose meter kit and supplies KIT Dispense based on patient and insurance preference. Use to check blood sugar two times daily as directed. (FOR ICD-9 250.00, 250.01). Qty: 1 each, Refills: 0    Insulin Pen Needle 31G X 5 MM MISC Use one needle to inject insulin with flexpen as indicated twice a day Qty: 100 each, Refills: 3    pantoprazole (PROTONIX) 40 MG tablet Take 1 tablet (40 mg total) by mouth 2 (two) times daily. Qty: 60 tablet, Refills: 1      STOP taking these medications     lisinopril (PRINIVIL,ZESTRIL) 10 MG tablet      baclofen (LIORESAL) 10 MG tablet       Insulin Isophane & Regular Human (HUMULIN 70/30 PEN) (70-30) 100 UNIT/ML PEN      lisinopril (PRINIVIL,ZESTRIL) 20 MG tablet      ondansetron (ZOFRAN ODT) 4 MG disintegrating tablet        No Known Allergies Follow-up Information    Follow up with DUDA,MARCUS V, MD In 1 week.   Specialty:  Orthopedic Surgery   Contact information:   [address] [place] [address] [phone]       Follow up with Nolene Ebbs A, MD In 1 week.   Specialty:  Internal Medicine   Contact information:   [place]  [address] [phone]        The results of significant diagnostics from this hospitalization (including imaging, microbiology, ancillary and laboratory) are listed below for reference.    Significant Diagnostic Studies: Ct Abdomen Pelvis Wo Contrast  09/27/2014   CLINICAL DATA:  Four-day history of nausea and vomiting with left-sided abdominal pain  EXAM: CT ABDOMEN AND PELVIS WITHOUT CONTRAST  TECHNIQUE: Multidetector CT imaging of the abdomen and pelvis was performed following the standard protocol without IV contrast.  COMPARISON:  11/21/2013  FINDINGS: The lung bases are free of acute infiltrate or sizable effusion.  The liver, spleen, adrenal glands and pancreas are within normal limits. The gallbladder has been surgically removed. The kidneys are well visualized bilaterally. Fullness of the left renal collecting system is seen. No definitive ureteral stone is seen. This fullness extends to the ureterovesical junction. This may be related to edema from recently passed stone or a poorly calcified stone. Right kidney is somewhat malrotated but shows no obstructive changes.  The appendix is within normal limits. The bladder is partially distended. No pelvic mass lesion or sidewall adenopathy is noted. Some right inguinal lymph nodes are seen. Some of these demonstrate a normal fatty hilus. This may be related to the bilateral lower extremity swelling reported  by patient. The largest of these lies adjacent to the right common femoral vein on image number 92 of series 2 it measures 3.7 x 1.8 cm in dimension. This was present on the prior exam but much smaller in size. This may be simply reactive in nature however.  IMPRESSION: Fullness in the left renal collecting system likely related to edema from recently passed stone or a small in a poorly calcified stone.  Lymphadenopathy particularly in the right inguinal region as described. This may simply be reactive in nature. Short-term followup is recommended to assess for resolution.   Electronically Signed   By: Inez Catalina [name]   On: 09/27/2014 19:23   Dg Chest 2 View  09/27/2014   CLINICAL DATA:  Left-sided pain and fevers  EXAM: CHEST  2 VIEW  COMPARISON:  05/30/2014  FINDINGS: The heart size and mediastinal contours are within normal limits. Both lungs are clear. The visualized skeletal structures are unremarkable.  IMPRESSION: No active cardiopulmonary disease.   Electronically Signed   By: Inez Catalina [name]   On: 09/27/2014 15:25   Nm Pulmonary Perf And Vent  09/28/2014   CLINICAL DATA:  Shortness of breath and chest pain for the past 3 days.  EXAM: NUCLEAR MEDICINE VENTILATION - PERFUSION LUNG SCAN  TECHNIQUE: Ventilation images were obtained in multiple projections using inhaled aerosol technetium 99 M DTPA. Perfusion images were obtained in multiple projections after intravenous injection of Tc-76m MAA.  RADIOPHARMACEUTICALS:  40 mCi Tc-72m DTPA aerosol and 6 mCi Tc-41m MAA  COMPARISON:  Chest radiographs obtained yesterday.  FINDINGS: Ventilation: Normal ventilation of both lungs. No ventilation defects.  Perfusion: Normal perfusion of both lungs.  No perfusion defects.  IMPRESSION: Normal examination.  No evidence of pulmonary embolism.   Electronically Signed   By: Claudie Revering [name]   On: 09/28/2014 18:38   Dg Foot 2 Views Right  10/01/2014   CLINICAL DATA:  Diabetic foot infection.  Drainage from fifth  toe  EXAM: RIGHT FOOT - 2 VIEW  COMPARISON:  11/14/2013  FINDINGS: Amputation first toe at the level of the metatarsal phalangeal joint. Amputation of the second toe at the level of the distal second metatarsal  Chronic septic arthritis involving the third metatarsal phalangeal joint and interphalangeal joint. Bony proliferative changes and sclerotic change have progressed in the interval.  Soft tissue swelling of the fifth toe. Gas in the soft tissues. Lucency in the distal fifth phalanx suggestive of osteomyelitis.  IMPRESSION: Soft tissue swelling and soft tissue gas in the fifth toe with probable  osteomyelitis distal fifth phalanx  Chronic septic arthritis third metatarsal phalangeal joint and interphalangeal joint progressive bony changes since the prior study.   Electronically Signed   By: Franchot Gallo [name]   On: 10/01/2014 17:50    Microbiology: Recent Results (from the past 240 hour(s))  Culture, blood (routine x 2)     Status: None   Collection Time: 09/27/14  3:05 PM  Result Value Ref Range Status   Specimen Description BLOOD ARM RIGHT  Final   Special Requests BOTTLES DRAWN AEROBIC AND ANAEROBIC 10CC  Final   Culture   Final    NO GROWTH 5 DAYS Performed at [facility]    Report Status 10/03/2014 FINAL  Final  Culture, blood (routine x 2)     Status: None   Collection Time: 09/27/14  3:09 PM  Result Value Ref Range Status   Specimen Description BLOOD RIGHT HAND  Final   Special Requests BOTTLES DRAWN AEROBIC AND ANAEROBIC 5CC  Final   Culture   Final    NO GROWTH 5 DAYS Performed at [facility]    Report Status 10/03/2014 FINAL  Final  Urine culture     Status: None   Collection Time: 09/27/14  6:44 PM  Result Value Ref Range Status   Specimen Description URINE, RANDOM  Final   Special Requests NONE  Final   Colony Count NO GROWTH Performed at [facility]   Final   Culture NO GROWTH Performed at [facility]   Final   Report Status  09/29/2014 FINAL  Final  Culture, blood (routine x 2)     Status: None (Preliminary result)   Collection Time: 10/01/14  6:00 PM  Result Value Ref Range Status   Specimen Description BLOOD RIGHT ANTECUBITAL  Final   Special Requests BOTTLES DRAWN AEROBIC AND ANAEROBIC 5 CC  Final   Culture   Final           BLOOD CULTURE RECEIVED NO GROWTH TO DATE CULTURE WILL BE HELD FOR 5 DAYS BEFORE ISSUING A FINAL NEGATIVE REPORT Performed at [facility]    Report Status PENDING  Incomplete  Culture, blood (routine x 2)     Status: None (Preliminary result)   Collection Time: 10/01/14  6:21 PM  Result Value Ref Range Status   Specimen Description BLOOD RIGHT ARM  Final   Special Requests BOTTLES DRAWN AEROBIC ONLY 10 CC  Final   Culture   Final           BLOOD CULTURE RECEIVED NO GROWTH TO DATE CULTURE WILL BE HELD FOR 5 DAYS BEFORE ISSUING A FINAL NEGATIVE REPORT Performed at [facility]    Report Status PENDING  Incomplete  Surgical pcr screen     Status: None   Collection Time: 10/03/14  5:35 AM  Result Value Ref Range Status   MRSA, PCR NEGATIVE NEGATIVE Final   Staphylococcus aureus NEGATIVE NEGATIVE Final    Comment:        The Xpert SA Assay (FDA approved for NASAL specimens in patients over 20 years of age), is one component of a comprehensive surveillance program.  Test performance has been validated by [facility] for patients greater than or equal to 87 year old. It is not intended to diagnose infection nor to guide or monitor treatment.      Labs: Basic Metabolic Panel:  Recent Labs Lab 10/01/14 1800 10/02/14 0604 10/03/14 0648 10/04/14 0710 10/05/14 0547  NA 132* 133*  136 133* 135  K 4.1 4.2 3.8 4.0 4.0  CL 94* 98 102 98 99  CO2 $Re'29 26 26 28 29  'grT$ GLUCOSE 294* 329* 154* 232* 216*  BUN $Re'14 14 15 14 14  'mwe$ CREATININE 1.71* 1.61* 1.59* 1.71* 1.79*  CALCIUM 8.9 8.4 8.5 8.3* 8.8   Liver Function Tests:  Recent Labs Lab 10/02/14 0604  AST 25  ALT  25  ALKPHOS 72  BILITOT 0.3  PROT 6.6  ALBUMIN 2.2*   No results for input(s): LIPASE, AMYLASE in the last 168 hours. No results for input(s): AMMONIA in the last 168 hours. CBC:  Recent Labs Lab 10/01/14 1800 10/02/14 0604 10/03/14 0648 10/04/14 0710 10/05/14 0547  WBC 10.2 9.9 7.8 7.7 8.1  NEUTROABS 7.0  --   --   --   --   HGB 10.1* 10.0* 9.6* 9.2* 10.1*  HCT 30.3* 30.5* 29.4* 28.1* 31.0*  MCV 89.9 90.5 90.2 89.5 90.4  PLT 269 251 270 294 315   Cardiac Enzymes: No results for input(s): CKTOTAL, CKMB, CKMBINDEX, TROPONINI in the last 168 hours. BNP: BNP (last 3 results)  Recent Labs  05/30/14 0041  BNP 19.2    ProBNP (last 3 results) No results for input(s): PROBNP in the last 8760 hours.  CBG:  Recent Labs Lab 10/04/14 0920 10/04/14 1143 10/04/14 1636 10/04/14 2140 10/05/14 0740  GLUCAP 264* 240* 191* 211* 189*       Signed:  Merly Hinkson A  [facility] 10/05/2014, 10:11 AM

## 2014-10-05 NOTE — Progress Notes (Signed)
Pt discharge instructions and prescriptions given, pt verbalized understanding.  VSS. Denies pain. Pt left floor via wheelchair accompanied by staff and family. 

## 2014-10-05 NOTE — Progress Notes (Signed)
Occupational Therapy Treatment Patient Details Name: [name] MRN: [id number] DOB: 01/29/1976 Today's Date: 10/05/2014    History of present illness S/P R midfoot amputation due to non healing wound/osteomyelitis. PMH: uncontrolled DM, CKD, HTN, tobacco abuse.   OT comments  Pt performed toilet transfers and stood at sink with R knee supported on a chair with supervision.  Instructed pt in use of shower seat without stepping over edge of tub.  Good adherence to NWB precautions on R LE.  Pt is eager to go home.  Follow Up Recommendations  No OT follow up;Supervision - Intermittent    Equipment Recommendations  None recommended by OT (pt reports having a shower chair)    Recommendations for Other Services      Precautions / Restrictions Precautions Precautions: Fall Required Braces or Orthoses: Other Brace/Splint Other Brace/Splint: post op shoe Restrictions RLE Weight Bearing: Non weight bearing       Mobility Bed Mobility Overal bed mobility: Modified Independent                Transfers   Equipment used: Crutches Transfers: Sit to/from Stand Sit to Stand: Supervision         General transfer comment: good adherence to NWB on R LE    Balance                                   ADL Overall ADL's : Needs assistance/impaired     Grooming: Wash/dry hands;Wash/dry face;Oral care;Modified independent;Standing (with R knee on a chair)                   Toilet Transfer: Supervision/safety;Regular Toilet (crutches) Toilet Transfer Details (indicate cue type and reason): used rail and wall, has a sink and wall to hold at home, does not want a riser or 3 in 1 Toileting- Clothing Manipulation and Hygiene: Supervision/safety;Sit to/from stand       Functional mobility during ADLs: Supervision/safety (crutches) General ADL Comments: Pt reports having a shower seat at home that he had forgotten about.  Instructed pt in transferring to shower  seat without stepping over edge of tub. Pt verbalized understanding.      Vision                     Perception     Praxis      Cognition   Behavior During Therapy: WFL for tasks assessed/performed Overall Cognitive Status: Within Functional Limits for tasks assessed                       Extremity/Trunk Assessment               Exercises     Shoulder Instructions       General Comments      Pertinent Vitals/ Pain       Pain Assessment: Faces Faces Pain Scale: Hurts a little bit Pain Location: R foot Pain Descriptors / Indicators: Aching Pain Intervention(s): Repositioned;Monitored during session;Limited activity within patient's tolerance  Home Living                                          Prior Functioning/Environment              Frequency       Progress Toward  Goals  OT Goals(current goals can now be found in the care plan section)  Progress towards OT goals: Progressing toward goals  Acute Rehab OT Goals Patient Stated Goal: Hopes to get home soon  Plan Discharge plan remains appropriate    Co-evaluation                 End of Session     Activity Tolerance Patient tolerated treatment well   Patient Left in chair;with call bell/phone within reach;with nursing/sitter in room   Nurse Communication          Time: 0934-1000 OT Time Calculation (min): 26 min  Charges: OT General Charges $OT Visit: 1 Procedure OT Treatments $Self Care/Home Management : 23-37 mins  Evern Bio 10/05/2014, 10:19 AM  [phone]

## 2014-10-05 NOTE — Progress Notes (Signed)
Patient ID: [name], male   DOB: 04/13/1976, 39 y.o.   MRN: [id number] Postoperative day 2 right transmetatarsal amputation. The dressing is clean and dry. I feel patient is safe for discharge to home. I will follow-up in the office next week. Patient is to continue nonweightbearing on the right foot and keep the dressing clean and dry until follow-up.

## 2014-10-05 NOTE — Progress Notes (Signed)
UR COMPLETED  

## 2014-10-08 LAB — CULTURE, BLOOD (ROUTINE X 2)
CULTURE: NO GROWTH
Culture: NO GROWTH

## 2014-10-24 ENCOUNTER — Other Ambulatory Visit: Payer: Self-pay

## 2014-10-24 DIAGNOSIS — I10 Essential (primary) hypertension: Secondary | ICD-10-CM

## 2014-10-24 DIAGNOSIS — E088 Diabetes mellitus due to underlying condition with unspecified complications: Secondary | ICD-10-CM

## 2014-10-24 NOTE — Patient Outreach (Signed)
Patient returned call to RN CM.  RN CM explained to him that Dr. Concepcion ElkAvbuere would like to see him this Friday, May 20th at 2 pm.  Patient stated he would be able to make this appointment.  RN CM instructed patient to call the doctor's office to confirm the appointment.  Instructed patient to bring his after visit summary sheet and medications to the appointment.  Patient stated he would.  Patient confirmed that  he has transportation to get to the appointment on Friday.  Wynonia Lawmanheryl Tung Pustejovsky, RN, Lowella DellMHA, [facility] Telephonic Care Coordinator [phone]

## 2014-10-24 NOTE — Patient Outreach (Signed)
[facility]) Care Management  10/24/2014  Dennie FettersLamont Deyton 04/05/1976 [id number]   RN CM called the primary doctor office of Dr. Fleet ContrasEdwin Avbuere.  RN CM spoke with Morrie SheldonAshley, Patient Care Coordinator.  Informed Morrie Sheldonshley that patient has accept the services of [facility].  Informed Morrie Sheldonshley that patient was not taking his medications according the his recent discharge orders.  Patient's discharge orders stated he was to take Lantus insulin at bedtime and sliding scale Novolog three times a day.  Patient did not get the prescription for Lantus filled.  Stated he did not have the money to purchase the medication.  Patient stated he continued to take Humulin 70/30 which was discontinued on discharge order sheet.  Patient remarked " I thought I had a choice of whether I wanted to take the Lantus so I choose not to and instead took the Humulin which I already had at home".   Morrie Sheldonshley stated she would inform Dr. Concepcion ElkAvbuere about patient's status.  Patient did not follow up with his appointment of seeing the doctor post discharge from the hospital.  Morrie Sheldonshley stated patient last office visit was in April.  She asked if RN CM could contact patient and ask him if he could come into the office this Friday afternoon at 2 pm (Oct 27, 2014).  RN CM will call patient and give him the message. RN CM was not able to speak with patient and left a voice mail message with return call back number for questions.  Wynonia Lawmanheryl Tametra Ahart, RN, Lowella DellMHA, [facility] Telephonic Care Coordinator [phone]

## 2014-10-24 NOTE — Patient Outreach (Signed)
[facility]) Care Management  10/24/2014  Curtis FettersLamont Buchanon 02/15/1976 [id number]   RN [facility] to education and disease management of diabetes and hypertension.  RN CM spoke with patient and discussed the services of [facility].  Patient agrees to the services and agreed to schedule an appointment for community nurse to make a home assessment visit. Patient was admit 10/01/14 and discharged 09/2814. Status post 4 toes amputation of right foot.  Poorly controlled diabetes. 09-28-14 Hgb A1c 13.6.  Has knowledge deficit on medication management. Discharged  home on Lantus did not get new med filled. Stated he did not have the money, but he thought he had the choice of taking Humulin 70/30 ( which had been discontinued at discharge) or the Lantus and he choose to take his old medication of Humulin. New discharge orders were to take Novolog TID with meals and Lantus at bedtime. Per patient stated he checks blood sugar after meal and [facility] and bedtime. Has not had any nutritional education for his diabetes. His Mother prepares his meals. Patient agrees to having Heritage managerCommunity Nurse make a home assessment visit.   Will also make a referral for Pharmacy to help with affording medications and medication reconciliation.  Wynonia Lawmanheryl Loye Vento, RN, Lowella DellMHA, [facility] Telephonic Care Coordinator [phone]

## 2014-10-30 NOTE — Patient Outreach (Signed)
[facility]) Care Management  10/30/2014  Curtis FettersLamont Strang 01/01/1976 [id number]  Notification from Wynonia Lawmanheryl Poteat, RN patient is active with [facility] Care [facility] services.  Corrie MckusickLisa O. Sharlee BlewMoore, AABA [facility] Care Management [facility] CM Assistant Phone: [phone] Fax: [phone]

## 2014-10-31 ENCOUNTER — Other Ambulatory Visit: Payer: Self-pay

## 2014-11-02 ENCOUNTER — Encounter: Payer: Self-pay | Admitting: Pharmacist

## 2014-11-02 NOTE — Patient Outreach (Signed)
[facility]) Care Management  [place]   11/02/2014  [name] [birth year] [id number]  Subjective: [name] is a 39 y.o. male who was referred to [facility] for medication reconciliation and medication assistance. I reviewed the patient's chart and see that he has an appointment with his primary care physician, Dr. Jeanie Cooks, tomorrow Friday 11/03/14. He was discharged from the hospital on Lantus and Novolog but could not afford it so he decided to continue to use Humulin 70/30 instead. He has not followed up with his primary care provider since his admission.  Objective:   Current Medications: Current Outpatient Prescriptions  Medication Sig Dispense Refill  . Amino Acids-Protein Hydrolys (FEEDING SUPPLEMENT, PRO-STAT SUGAR FREE 64,) LIQD Take 30 mLs by mouth 2 (two) times daily. 900 mL 0  . amLODipine (NORVASC) 2.5 MG tablet Take 1 tablet (2.5 mg total) by mouth daily. 30 tablet 0  . blood glucose meter kit and supplies KIT Dispense based on patient and insurance preference. Use to check blood sugar two times daily as directed. (FOR ICD-9 250.00, 250.01). (Patient not taking: Reported on 10/01/2014) 1 each 0  . docusate sodium (COLACE) 100 MG capsule Take 1 capsule (100 mg total) by mouth 2 (two) times daily. 10 capsule 0  . doxycycline (VIBRA-TABS) 100 MG tablet Take 1 tablet (100 mg total) by mouth 2 (two) times daily. 28 tablet 0  . gabapentin (NEURONTIN) 100 MG capsule Take 2 capsules (200 mg total) by mouth 3 (three) times daily. 90 capsule 0  . HYDROcodone-acetaminophen (NORCO/VICODIN) 5-325 MG per tablet Take 1 tablet by mouth every 6 (six) hours as needed for severe pain. 30 tablet 0  . insulin aspart (NOVOLOG) 100 UNIT/ML injection Inject 3 Units into the skin 3 (three) times daily with meals. 10 mL 11  . insulin glargine (LANTUS) 100 UNIT/ML injection Inject 0.15 mLs (15 Units total) into the skin at bedtime. 10 mL 11  .  Insulin Pen Needle 31G X 5 MM MISC Use one needle to inject insulin with flexpen as indicated twice a day (Patient not taking: Reported on 10/01/2014) 100 each 3  . pantoprazole (PROTONIX) 40 MG tablet Take 1 tablet (40 mg total) by mouth 2 (two) times daily. (Patient not taking: Reported on 10/01/2014) 60 tablet 1  . polyethylene glycol (MIRALAX / GLYCOLAX) packet Take 17 g by mouth 2 (two) times daily. 14 each 0   No current facility-administered medications for this visit.    Functional Status: In your present state of health, do you have any difficulty performing the following activities: 10/31/2014 10/01/2014  Hearing? N N  Vision? N N  Difficulty concentrating or making decisions? N N  Walking or climbing stairs? Y N  Dressing or bathing? Y N  Doing errands, shopping? Y N  Preparing Food and eating ? Y -  Using the Toilet? N -  In the past six months, have you accidently leaked urine? N -  Do you have problems with loss of bowel control? N -  Managing your Medications? N -  Managing your Finances? N -  Housekeeping or managing your Housekeeping? Y -    Fall/Depression Screening: PHQ 2/9 Scores 10/31/2014  PHQ - 2 Score 0    Assessment: 1. Medication reconciliation: no issues noted except for duplicate insulin therapies but patient is not using both regimens. See below for further assessment.  Drugs sorted by system:  Neurologic/Psychologic: gabapentin  Cardiovascular: amlodipine  Pulmonary/Allergy: none  Gastrointestinal: feeding  supplement, docusate, pantoprazole, polyethylene glycol  Endocrine: Novolog, Lantus (but patient using 70/30 at home)  Urologic: none  Topical: none  Pain: gabapentin, hydrocodone-acetaminophen  Miscellaneous: doxycycline   Duplications in therapy: none Gaps in therapy: none  Medications to avoid in the elderly: patient is <65 y.o. Drug interactions: none  Plan: 1. Medication reconciliation: no issues noted with patient's  medication list. However, patient is taking 70/30 at home due to cost reasons and not the Lantus and Novolog that he was discharged on. 70/30 is much cheaper than Lantus and Novolog for patients so I think it is reasonable to him to use that if his primary care provider agrees. Patient is meeting with primary care provider tomorrow and his office is aware of the insulin cost issues. I will let them meet and decide on a plan and then reach out to patient for further assistance obtaining his medications. Plan to reach out Tuesday 11/07/14.  Nicoletta Ba, PharmD, [facility] Resident [phone]

## 2014-11-03 ENCOUNTER — Other Ambulatory Visit: Payer: Self-pay

## 2014-11-07 ENCOUNTER — Other Ambulatory Visit: Payer: Self-pay | Admitting: Pharmacist

## 2014-11-07 ENCOUNTER — Other Ambulatory Visit: Payer: Self-pay

## 2014-11-07 NOTE — Patient Outreach (Signed)
   Plan: Initial home visit on Friday, June 10 at 10am for further community case management assessment and transition of care coordination

## 2014-11-07 NOTE — Patient Outreach (Signed)
[facility]) Care Management  [place]   11/07/2014  [name] May 08, 1976 [id number]  Subjective: [name] is a 39 y.o. male who was referred to [facility] for medication assistance. Patient had an appointment with his primary care physician, Dr. Jeanie Cooks, Friday 11/03/14. He was discharged from the hospital on Lantus and Novolog but could not afford it so he decided to continue to use Humulin 70/30 instead. He has not followed up with his primary care provider since his admission. The visit on Friday was for medication reconciliation.  I called patient today to follow up and to determine which insulin he and Dr. Jeanie Cooks decided to use.   Objective:   Current Medications: Current Outpatient Prescriptions  Medication Sig Dispense Refill  . Amino Acids-Protein Hydrolys (FEEDING SUPPLEMENT, PRO-STAT SUGAR FREE 64,) LIQD Take 30 mLs by mouth 2 (two) times daily. 900 mL 0  . amLODipine (NORVASC) 2.5 MG tablet Take 1 tablet (2.5 mg total) by mouth daily. 30 tablet 0  . blood glucose meter kit and supplies KIT Dispense based on patient and insurance preference. Use to check blood sugar two times daily as directed. (FOR ICD-9 250.00, 250.01). 1 each 0  . docusate sodium (COLACE) 100 MG capsule Take 1 capsule (100 mg total) by mouth 2 (two) times daily. (Patient not taking: Reported on 11/03/2014) 10 capsule 0  . doxycycline (VIBRA-TABS) 100 MG tablet Take 1 tablet (100 mg total) by mouth 2 (two) times daily. (Patient not taking: Reported on 11/03/2014) 28 tablet 0  . gabapentin (NEURONTIN) 100 MG capsule Take 2 capsules (200 mg total) by mouth 3 (three) times daily. 90 capsule 0  . HYDROcodone-acetaminophen (NORCO/VICODIN) 5-325 MG per tablet Take 1 tablet by mouth every 6 (six) hours as needed for severe pain. 30 tablet 0  . insulin aspart (NOVOLOG) 100 UNIT/ML injection Inject 3 Units into the skin 3 (three) times daily with meals. 10 mL 11   . insulin glargine (LANTUS) 100 UNIT/ML injection Inject 0.15 mLs (15 Units total) into the skin at bedtime. 10 mL 11  . Insulin Pen Needle 31G X 5 MM MISC Use one needle to inject insulin with flexpen as indicated twice a day (Patient not taking: Reported on 10/01/2014) 100 each 3  . pantoprazole (PROTONIX) 40 MG tablet Take 1 tablet (40 mg total) by mouth 2 (two) times daily. (Patient not taking: Reported on 10/01/2014) 60 tablet 1  . polyethylene glycol (MIRALAX / GLYCOLAX) packet Take 17 g by mouth 2 (two) times daily. (Patient not taking: Reported on 11/03/2014) 14 each 0   No current facility-administered medications for this visit.    Functional Status: In your present state of health, do you have any difficulty performing the following activities: 11/03/2014 10/31/2014  Hearing? Y N  Vision? - N  Difficulty concentrating or making decisions? - N  Walking or climbing stairs? - Y  Dressing or bathing? - Y  Doing errands, shopping? - Y  Preparing Food and eating ? - Y  Using the Toilet? - N  In the past six months, have you accidently leaked urine? - N  Do you have problems with loss of bowel control? - N  Managing your Medications? - N  Managing your Finances? - N  Housekeeping or managing your Housekeeping? - Y    Fall/Depression Screening: PHQ 2/9 Scores 11/03/2014 10/31/2014  PHQ - 2 Score 0 0    Assessment: 1. Medication management: patient denies any issues affording his  medications now that he and Dr. Jeanie Cooks have agreed that he will use Humulin.  Plan: 1. Medication management: no interventions will be made. Patient can afford and has access to all of his medications. He denies any other questions or issues that are pharmacy-related. Will close out of pharmacy program.  Nicoletta Ba, PharmD, [facility] Resident [phone]

## 2014-11-17 ENCOUNTER — Other Ambulatory Visit: Payer: Self-pay

## 2014-11-17 NOTE — Patient Outreach (Signed)
[facility]) Care Management  11/17/2014  [name] [birth year] [id number]   This RNCM made telephone made telephone call to patient to advise patient she was running late for his appointment, running about 20 minutes late.  Patient requested rescheduling of appointment until next week.  Appointment rescheduled for Thursday, June 16.

## 2014-11-23 ENCOUNTER — Other Ambulatory Visit: Payer: PPO

## 2014-11-23 NOTE — Patient Outreach (Signed)
[facility]) Care Management  11/23/2014  [name] 01/13/1976 [id number]   This RNCM arrived at patient's home for initial scheduled home visit. There was no answer at the door and patient did not answer telephone as this RNCM called while sitting in the car in his driveway.   Plan: Telephone contact on Monday, November 27, 2014

## 2014-11-27 ENCOUNTER — Other Ambulatory Visit: Payer: Self-pay

## 2014-11-27 NOTE — Patient Outreach (Signed)
Unsuccessful attempt made to contact patient for case management assessment of needs.  Plan: Discharge from case load due to inability to maintain contact patient. Will send Corrie Mckusick. Christell Constant,  [facility] Case Management Assistant, In [employer], advising her of discharge with rationale as being as stated above.

## 2014-11-29 NOTE — Patient Outreach (Signed)
[facility]) Care Management  11/29/2014  Sulo Janczak 01-15-1976 [id number]   Notification from Erenest Rasher, RN to close case for [facility] due to unable to maintain contact.  Notification from Nicoletta Ba, PharmD to close case due to goals met.  Ronnell Freshwater. [place], [place] Management [place] Assistant Phone: [phone] Fax: [phone]

## 2015-01-16 ENCOUNTER — Encounter (HOSPITAL_COMMUNITY): Payer: Self-pay | Admitting: Emergency Medicine

## 2015-01-16 ENCOUNTER — Emergency Department (HOSPITAL_COMMUNITY)
Admission: EM | Admit: 2015-01-16 | Discharge: 2015-01-16 | Disposition: A | Discharge status: Home / Self Care | Payer: PPO | Attending: Emergency Medicine | Admitting: Emergency Medicine

## 2015-01-16 DIAGNOSIS — K088 Other specified disorders of teeth and supporting structures: Secondary | ICD-10-CM | POA: Diagnosis not present

## 2015-01-16 DIAGNOSIS — E119 Type 2 diabetes mellitus without complications: Secondary | ICD-10-CM | POA: Insufficient documentation

## 2015-01-16 DIAGNOSIS — K219 Gastro-esophageal reflux disease without esophagitis: Secondary | ICD-10-CM | POA: Insufficient documentation

## 2015-01-16 DIAGNOSIS — Z79899 Other long term (current) drug therapy: Secondary | ICD-10-CM | POA: Insufficient documentation

## 2015-01-16 DIAGNOSIS — J029 Acute pharyngitis, unspecified: Secondary | ICD-10-CM | POA: Insufficient documentation

## 2015-01-16 DIAGNOSIS — K0889 Other specified disorders of teeth and supporting structures: Secondary | ICD-10-CM

## 2015-01-16 DIAGNOSIS — Z720 Tobacco use: Secondary | ICD-10-CM | POA: Diagnosis not present

## 2015-01-16 DIAGNOSIS — Z862 Personal history of diseases of the blood and blood-forming organs and certain disorders involving the immune mechanism: Secondary | ICD-10-CM | POA: Insufficient documentation

## 2015-01-16 DIAGNOSIS — I10 Essential (primary) hypertension: Secondary | ICD-10-CM | POA: Insufficient documentation

## 2015-01-16 DIAGNOSIS — Z794 Long term (current) use of insulin: Secondary | ICD-10-CM | POA: Insufficient documentation

## 2015-01-16 MED ORDER — NAPROXEN 500 MG PO TABS: 500.0000 mg | ORAL_TABLET | Freq: Two times a day (BID) | ORAL | Status: DC

## 2015-01-16 MED ORDER — LIDOCAINE VISCOUS 2 % MT SOLN: 20.0000 mL | OROMUCOSAL | Status: DC | PRN

## 2015-01-16 NOTE — ED Notes (Signed)
Pt. Stated, I've got a toothache and a sore throat for about a week and half.

## 2015-01-16 NOTE — Discharge Instructions (Signed)
Take the prescribed medication as directed. °Follow-up with your dentist. °Return to the ED for new or worsening symptoms. ° °

## 2015-01-16 NOTE — ED Notes (Signed)
Declined W/C at D/C and was escorted to lobby by RN. 

## 2015-01-16 NOTE — ED Provider Notes (Signed)
CSN: [id number]     Arrival date & time 01/16/15  1015 History  This chart was scribed for non-physician practitioner, Quincy Carnes, PA-C, working with Blanchie Dessert, MD by Ladene Artist, ED Scribe. This patient was seen in room TR06C/TR06C and the patient's care was started at 11:04 AM.   Chief Complaint  Patient presents with  . Dental Problem  . Sore Throat   The history is provided by the patient. No language interpreter was used.   HPI Comments: [name] is a 39 y.o. male, with a h/o HTN and DM, who presents to the Emergency Department complaining of constant left upper dental pain for 1.5 weeks. Pt reports that pain is exacerbated with palpation. He reports associated constant sore throat and left ear pain for the past 1.5 week as well. Pt denies fever. His dentist is Dr. Carlota Raspberry on [address] but he has not followed up with him yet.  Has taken OTC pain meds PTA, states they work for a brief period of time but pain always returns.  VSS.  Past Medical History  Diagnosis Date  . Hypertension   . Diabetes mellitus     type 2 iddm x 18 yrs  . Vascular disease     poor circulation to left foot  . Acute osteomyelitis, ankle and foot 07/30/2010    Qualifier: Diagnosis of  By: Tommy Medal MD, Roderic Scarce    . Anemia   . Renal insufficiency   . Hiatal hernia   . DDD (degenerative disc disease), lumbar   . Pancreatitis   . Gastroparesis   . Collagen vascular disease   . GERD (gastroesophageal reflux disease)    Past Surgical History  Procedure Laterality Date  . Cholecystectomy    . Toe amputation  2012    left foot; great toe and second toe  . Amputation  04/25/2011    Procedure: AMPUTATION DIGIT;  Surgeon: Newt Minion, MD;  Location: [place];  Service: Orthopedics;  Laterality: Left;  Left foot 3rd toe amputation MTP joint, Gastroc Recession  Achilles Lengthening   . Amputation Bilateral 03/25/2013    Procedure: AMPUTATION RAY;  Surgeon: Newt Minion, MD;  Location: [place];   Service: Orthopedics;  Laterality: Bilateral;  Left Great Toe Amputation at  MTP Joint, Right 1st and 2nd Ray Amputation   . Amputation Right 10/03/2014    Procedure: AMPUTATION MIDFOOT;  Surgeon: Newt Minion, MD;  Location: [place];  Service: Orthopedics;  Laterality: Right;   Family History  Problem Relation Age of Onset  . Heart attack Father 60  . Hypertension Sister    History  Substance Use Topics  . Smoking status: Current Every Day Smoker -- 0.10 packs/day for 3 years    Types: Cigarettes  . Smokeless tobacco: Never Used  . Alcohol Use: No    Review of Systems  Constitutional: Negative for fever.  HENT: Positive for dental problem, ear pain and sore throat.   All other systems reviewed and are negative.  Allergies  Review of patient's allergies indicates no known allergies.  Home Medications   Prior to Admission medications   Medication Sig Start Date End Date Taking? Authorizing Provider  Amino Acids-Protein Hydrolys (FEEDING SUPPLEMENT, PRO-STAT SUGAR FREE 64,) LIQD Take 30 mLs by mouth 2 (two) times daily. 10/05/14   Belkys A Regalado, MD  amLODipine (NORVASC) 2.5 MG tablet Take 1 tablet (2.5 mg total) by mouth daily. 10/05/14   Belkys A Regalado, MD  blood glucose meter kit  and supplies KIT Dispense based on patient and insurance preference. Use to check blood sugar two times daily as directed. (FOR ICD-9 250.00, 250.01). 09/30/14   Barton Dubois, MD  docusate sodium (COLACE) 100 MG capsule Take 1 capsule (100 mg total) by mouth 2 (two) times daily. Patient not taking: Reported on 11/03/2014 10/05/14   Belkys A Regalado, MD  doxycycline (VIBRA-TABS) 100 MG tablet Take 1 tablet (100 mg total) by mouth 2 (two) times daily. Patient not taking: Reported on 11/03/2014 10/05/14   Belkys A Regalado, MD  gabapentin (NEURONTIN) 100 MG capsule Take 2 capsules (200 mg total) by mouth 3 (three) times daily. 05/30/14   Albertine Patricia, MD  HYDROcodone-acetaminophen (NORCO/VICODIN)  5-325 MG per tablet Take 1 tablet by mouth every 6 (six) hours as needed for severe pain. 10/05/14   Belkys A Regalado, MD  insulin aspart (NOVOLOG) 100 UNIT/ML injection Inject 3 Units into the skin 3 (three) times daily with meals. 10/05/14   Belkys A Regalado, MD  insulin glargine (LANTUS) 100 UNIT/ML injection Inject 0.15 mLs (15 Units total) into the skin at bedtime. 10/05/14   Belkys A Regalado, MD  Insulin Pen Needle 31G X 5 MM MISC Use one needle to inject insulin with flexpen as indicated twice a day Patient not taking: Reported on 10/01/2014 09/30/14   Barton Dubois, MD  pantoprazole (PROTONIX) 40 MG tablet Take 1 tablet (40 mg total) by mouth 2 (two) times daily. Patient not taking: Reported on 10/01/2014 09/30/14   Barton Dubois, MD  polyethylene glycol [facility] Floria Raveling) packet Take 17 g by mouth 2 (two) times daily. Patient not taking: Reported on 11/03/2014 10/05/14   Belkys A Regalado, MD   BP 167/92 mmHg  Pulse 81  Temp(Src) 98.6 F (37 C) (Oral)  Resp 18  Ht _0  (1.854 m)  Wt 258 lb (117.028 kg)  BMI 34.05 kg/m2  SpO2 96% Physical Exam  Constitutional: He is oriented to person, place, and time. He appears well-developed and well-nourished.  HENT:  Head: Normocephalic and atraumatic.  Right Ear: Tympanic membrane and ear canal normal.  Left Ear: Tympanic membrane and ear canal normal.  Mouth/Throat: Uvula is midline, oropharynx is clear and moist and mucous membranes are normal.  Teeth largely in poor dentition, left upper molar with cavity noted, surrounding gingiva normal in appearance without visible abscess, handling secretions appropriately, no trismus; no facial or neck swelling Tonsils normal in appearance bilaterally without exudate; uvula midline without peritonsillar abscess; handling secretions appropriately; no difficulty swallowing or speaking  Eyes: Conjunctivae and EOM are normal. Pupils are equal, round, and reactive to light.  Neck: Normal range of motion.   Cardiovascular: Normal rate, regular rhythm and normal heart sounds.   Pulmonary/Chest: Effort normal and breath sounds normal.  Abdominal: Soft. Bowel sounds are normal.  Musculoskeletal: Normal range of motion.  Neurological: He is alert and oriented to person, place, and time.  Skin: Skin is warm and dry.  Psychiatric: He has a normal mood and affect.  Nursing note and vitals reviewed.  ED Course  Procedures (including critical care time) DIAGNOSTIC STUDIES: Oxygen Saturation is 96% on RA, adequate by my interpretation.    COORDINATION OF CARE: 11:06 AM-Discussed treatment plan which includes Naproxen, Xylocaine and follow-up with dentist with pt at bedside and pt agreed to plan.   Labs Review Labs Reviewed - No data to display  Imaging Review No results found.   EKG Interpretation None      MDM  Final diagnoses:  Pain, dental  Sore throat   This is a 39 year old male here with left upper dental pain. He is afebrile, nontoxic. He does have a cavity of his left upper molar. No signs of surrounding dental abscess. There is no facial or neck swelling, no clinical suspicion for Ludwig's angina.  He also complains of a sore throat and left ear pain, HEENT exam WNL without signs of infection.  Suspect this is likely related to his dental pain.  Patient d/c home with supportive care, dental follow-up given.  Discussed plan with patient, he/she acknowledged understanding and agreed with plan of care.  Return precautions given for new or worsening symptoms.  I personally performed the services described in this documentation, which was scribed in my presence. The recorded information has been reviewed and is accurate.  Larene Pickett, PA-C 01/16/15 [place], MD 01/22/15 2149

## 2015-01-25 ENCOUNTER — Encounter (HOSPITAL_COMMUNITY): Payer: Self-pay | Admitting: *Deleted

## 2015-01-25 ENCOUNTER — Emergency Department (HOSPITAL_COMMUNITY)
Admission: EM | Admit: 2015-01-25 | Discharge: 2015-01-25 | Disposition: A | Discharge status: Home / Self Care | Payer: PPO | Attending: Emergency Medicine | Admitting: Emergency Medicine

## 2015-01-25 ENCOUNTER — Emergency Department (HOSPITAL_COMMUNITY): Payer: PPO

## 2015-01-25 DIAGNOSIS — Z794 Long term (current) use of insulin: Secondary | ICD-10-CM | POA: Insufficient documentation

## 2015-01-25 DIAGNOSIS — Z862 Personal history of diseases of the blood and blood-forming organs and certain disorders involving the immune mechanism: Secondary | ICD-10-CM | POA: Insufficient documentation

## 2015-01-25 DIAGNOSIS — R0789 Other chest pain: Secondary | ICD-10-CM | POA: Insufficient documentation

## 2015-01-25 DIAGNOSIS — K219 Gastro-esophageal reflux disease without esophagitis: Secondary | ICD-10-CM | POA: Insufficient documentation

## 2015-01-25 DIAGNOSIS — Z720 Tobacco use: Secondary | ICD-10-CM | POA: Insufficient documentation

## 2015-01-25 DIAGNOSIS — R079 Chest pain, unspecified: Secondary | ICD-10-CM | POA: Diagnosis present

## 2015-01-25 DIAGNOSIS — Z87448 Personal history of other diseases of urinary system: Secondary | ICD-10-CM | POA: Insufficient documentation

## 2015-01-25 DIAGNOSIS — E119 Type 2 diabetes mellitus without complications: Secondary | ICD-10-CM | POA: Insufficient documentation

## 2015-01-25 DIAGNOSIS — Z8739 Personal history of other diseases of the musculoskeletal system and connective tissue: Secondary | ICD-10-CM | POA: Insufficient documentation

## 2015-01-25 DIAGNOSIS — Z79899 Other long term (current) drug therapy: Secondary | ICD-10-CM | POA: Insufficient documentation

## 2015-01-25 DIAGNOSIS — I10 Essential (primary) hypertension: Secondary | ICD-10-CM | POA: Insufficient documentation

## 2015-01-25 LAB — CBC
HCT: 35.5 % — ABNORMAL LOW (ref 39.0–52.0)
Hemoglobin: 12.3 g/dL — ABNORMAL LOW (ref 13.0–17.0)
MCH: 30.5 pg (ref 26.0–34.0)
MCHC: 34.6 g/dL (ref 30.0–36.0)
MCV: 88.1 fL (ref 78.0–100.0)
PLATELETS: 224 10*3/uL (ref 150–400)
RBC: 4.03 MIL/uL — ABNORMAL LOW (ref 4.22–5.81)
RDW: 14 % (ref 11.5–15.5)
WBC: 8.8 10*3/uL (ref 4.0–10.5)

## 2015-01-25 LAB — BASIC METABOLIC PANEL
Anion gap: 10 (ref 5–15)
BUN: 16 mg/dL (ref 6–20)
CALCIUM: 9 mg/dL (ref 8.9–10.3)
CHLORIDE: 98 mmol/L — AB (ref 101–111)
CO2: 25 mmol/L (ref 22–32)
CREATININE: 1.9 mg/dL — AB (ref 0.61–1.24)
GFR calc Af Amer: 50 mL/min — ABNORMAL LOW (ref >60)
GFR calc non Af Amer: 43 mL/min — ABNORMAL LOW (ref >60)
Glucose, Bld: 145 mg/dL — ABNORMAL HIGH (ref 65–99)
Potassium: 4.6 mmol/L (ref 3.5–5.1)
Sodium: 133 mmol/L — ABNORMAL LOW (ref 135–145)

## 2015-01-25 LAB — I-STAT TROPONIN, ED
TROPONIN I, POC: 0 ng/mL (ref 0.00–0.08)
Troponin i, poc: 0.01 ng/mL (ref 0.00–0.08)

## 2015-01-25 NOTE — ED Notes (Signed)
Pt started having intermittent CP three days ago and today has been constant to the left chest.  Pt has pain with breathing.  Pt states when he coughs it is worse.  No known heart disease

## 2015-01-25 NOTE — Discharge Instructions (Signed)
You were evaluated in the ED for your symptoms and there does not appear to be an emergent cause. Your exam, labs, EKG and x-ray are all reassuring. Please follow-up with your primary care for further evaluation and management of your symptoms. Return to ED for new or worsening symptoms.  Chest Pain (Nonspecific) It is often hard to give a diagnosis for the cause of chest pain. There is always a chance that your pain could be related to something serious, such as a heart attack or a blood clot in the lungs. You need to follow up with your doctor. HOME CARE  If antibiotic medicine was given, take it as directed by your doctor. Finish the medicine even if you start to feel better.  For the next few days, avoid activities that bring on chest pain. Continue physical activities as told by your doctor.  Do not use any tobacco products. This includes cigarettes, chewing tobacco, and e-cigarettes.  Avoid drinking alcohol.  Only take medicine as told by your doctor.  Follow your doctor's suggestions for more testing if your chest pain does not go away.  Keep all doctor visits you made. GET HELP IF:  Your chest pain does not go away, even after treatment.  You have a rash with blisters on your chest.  You have a fever. GET HELP RIGHT AWAY IF:   You have more pain or pain that spreads to your arm, neck, jaw, back, or belly (abdomen).  You have shortness of breath.  You cough more than usual or cough up blood.  You have very bad back or belly pain.  You feel sick to your stomach (nauseous) or throw up (vomit).  You have very bad weakness.  You pass out (faint).  You have chills. This is an emergency. Do not wait to see if the problems will go away. Call your local emergency services (911 in [place] Do not drive yourself to the hospital. MAKE SURE YOU:   Understand these instructions.  Will watch your condition.  Will get help right away if you are not doing well or get  worse. Document Released: 11/12/2007 Document Revised: 05/31/2013 Document Reviewed: 11/12/2007 [facility] Patient Information 2015 [place], [place]. This information is not intended to replace advice given to you by your health care provider. Make sure you discuss any questions you have with your health care provider.

## 2015-01-25 NOTE — ED Provider Notes (Signed)
CSN: [id number]     Arrival date & time 01/25/15  1831 History   First MD Initiated Contact with Patient 01/25/15 1905     Chief Complaint  Patient presents with  . Chest Pain     (Consider location/radiation/quality/duration/timing/severity/associated sxs/prior Treatment) HPI [name] is a 38 y.o. male with history of hypertension, diabetes, vascular disease, amputation left midfoot, comes in for evaluation of chest pain. Patient states for the past 3 days he has had intermittent chest pain that became constant today. Patient states he woke up around 6 or 7:00 this morning and had a sharp stabbing pain in his left chest. Reports this discomfort is worse with coughing and deep inspiration. Reports that it is relieved by lying down flat. Denies any associated shortness of breath, nausea or vomiting, numbness or weakness, leg swelling. Denies fevers, chills, cough. Patient states he is a current every day cigarette smoker. Patient also reports that his biological father died at age 59 of a heart attack.  Past Medical History  Diagnosis Date  . Hypertension   . Diabetes mellitus     type 2 iddm x 18 yrs  . Vascular disease     poor circulation to left foot  . Acute osteomyelitis, ankle and foot 07/30/2010    Qualifier: Diagnosis of  By: Tommy Medal MD, Roderic Scarce    . Anemia   . Renal insufficiency   . Hiatal hernia   . DDD (degenerative disc disease), lumbar   . Pancreatitis   . Gastroparesis   . Collagen vascular disease   . GERD (gastroesophageal reflux disease)    Past Surgical History  Procedure Laterality Date  . Cholecystectomy    . Toe amputation  2012    left foot; great toe and second toe  . Amputation  04/25/2011    Procedure: AMPUTATION DIGIT;  Surgeon: Newt Minion, MD;  Location: [place];  Service: Orthopedics;  Laterality: Left;  Left foot 3rd toe amputation MTP joint, Gastroc Recession  Achilles Lengthening   . Amputation Bilateral 03/25/2013    Procedure:  AMPUTATION RAY;  Surgeon: Newt Minion, MD;  Location: [place];  Service: Orthopedics;  Laterality: Bilateral;  Left Great Toe Amputation at  MTP Joint, Right 1st and 2nd Ray Amputation   . Amputation Right 10/03/2014    Procedure: AMPUTATION MIDFOOT;  Surgeon: Newt Minion, MD;  Location: [place];  Service: Orthopedics;  Laterality: Right;   Family History  Problem Relation Age of Onset  . Heart attack Father 61  . Hypertension Sister    Social History  Substance Use Topics  . Smoking status: Current Every Day Smoker -- 0.10 packs/day for 3 years    Types: Cigarettes  . Smokeless tobacco: Never Used  . Alcohol Use: No    Review of Systems A 10 point review of systems was completed and was negative except for pertinent positives and negatives as mentioned in the history of present illness     Allergies  Review of patient's allergies indicates no known allergies.  Home Medications   Prior to Admission medications   Medication Sig Start Date End Date Taking? Authorizing Provider  gabapentin (NEURONTIN) 100 MG capsule Take 2 capsules (200 mg total) by mouth 3 (three) times daily. 05/30/14  Yes Albertine Patricia, MD  insulin aspart (NOVOLOG) 100 UNIT/ML injection Inject 3 Units into the skin 3 (three) times daily with meals. 10/05/14  Yes Belkys A Regalado, MD  insulin glargine (LANTUS) 100 UNIT/ML injection Inject 0.15  mLs (15 Units total) into the skin at bedtime. 10/05/14  Yes Belkys A Regalado, MD  lisinopril (PRINIVIL,ZESTRIL) 20 MG tablet Take 20 mg by mouth daily.   Yes Historical Provider, MD  Amino Acids-Protein Hydrolys (FEEDING SUPPLEMENT, PRO-STAT SUGAR FREE 64,) LIQD Take 30 mLs by mouth 2 (two) times daily. Patient not taking: Reported on 01/25/2015 10/05/14   Belkys A Regalado, MD  amLODipine (NORVASC) 2.5 MG tablet Take 1 tablet (2.5 mg total) by mouth daily. Patient not taking: Reported on 01/25/2015 10/05/14   Belkys A Regalado, MD  blood glucose meter kit and supplies KIT  Dispense based on patient and insurance preference. Use to check blood sugar two times daily as directed. (FOR ICD-9 250.00, 250.01). 09/30/14   Barton Dubois, MD  docusate sodium (COLACE) 100 MG capsule Take 1 capsule (100 mg total) by mouth 2 (two) times daily. Patient not taking: Reported on 11/03/2014 10/05/14   Belkys A Regalado, MD  doxycycline (VIBRA-TABS) 100 MG tablet Take 1 tablet (100 mg total) by mouth 2 (two) times daily. Patient not taking: Reported on 11/03/2014 10/05/14   Belkys A Regalado, MD  HYDROcodone-acetaminophen (NORCO/VICODIN) 5-325 MG per tablet Take 1 tablet by mouth every 6 (six) hours as needed for severe pain. Patient not taking: Reported on 01/25/2015 10/05/14   Elmarie Shiley, MD  Insulin Pen Needle 31G X 5 MM MISC Use one needle to inject insulin with flexpen as indicated twice a day Patient not taking: Reported on 10/01/2014 09/30/14   Barton Dubois, MD  lidocaine (XYLOCAINE) 2 % solution Use as directed 20 mLs in the mouth or throat as needed for mouth pain. Patient not taking: Reported on 01/25/2015 01/16/15   Larene Pickett, PA-C  naproxen (NAPROSYN) 500 MG tablet Take 1 tablet (500 mg total) by mouth 2 (two) times daily with a meal. Patient not taking: Reported on 01/25/2015 01/16/15   Larene Pickett, PA-C  pantoprazole (PROTONIX) 40 MG tablet Take 1 tablet (40 mg total) by mouth 2 (two) times daily. Patient not taking: Reported on 10/01/2014 09/30/14   Barton Dubois, MD  polyethylene glycol [facility] / Floria Raveling) packet Take 17 g by mouth 2 (two) times daily. Patient not taking: Reported on 11/03/2014 10/05/14   Belkys A Regalado, MD   BP 133/75 mmHg  Pulse 63  Temp(Src) 98.4 F (36.9 C) (Oral)  Resp 13  SpO2 98% Physical Exam  Constitutional: He is oriented to person, place, and time. He appears well-developed and well-nourished.  HENT:  Head: Normocephalic and atraumatic.  Mouth/Throat: Oropharynx is clear and moist.  Eyes: Conjunctivae are normal. Pupils are  equal, round, and reactive to light. Right eye exhibits no discharge. Left eye exhibits no discharge. No scleral icterus.  Neck: Neck supple.  Cardiovascular: Normal rate, regular rhythm and normal heart sounds.   Pulmonary/Chest: Effort normal and breath sounds normal. No respiratory distress. He has no wheezes. He has no rales.  Abdominal: Soft. There is no tenderness.  Musculoskeletal: Normal range of motion. He exhibits no edema or tenderness.  Neurological: He is alert and oriented to person, place, and time.  Cranial Nerves II-XII grossly intact  Skin: Skin is warm and dry. No rash noted.  Psychiatric: He has a normal mood and affect.  Nursing note and vitals reviewed.   ED Course  Procedures (including critical care time) Labs Review Labs Reviewed  BASIC METABOLIC PANEL - Abnormal; Notable for the following:    Sodium 133 (*)    Chloride 98 (*)  Glucose, Bld 145 (*)    Creatinine, Ser 1.90 (*)    GFR calc non Af Amer 43 (*)    GFR calc Af Amer 50 (*)    All other components within normal limits  CBC - Abnormal; Notable for the following:    RBC 4.03 (*)    Hemoglobin 12.3 (*)    HCT 35.5 (*)    All other components within normal limits  I-STAT TROPOININ, ED  Randolm Idol, ED    Imaging Review Dg Chest 2 View  01/25/2015   CLINICAL DATA:  Three day history of chest pain.  EXAM: CHEST  2 VIEW  COMPARISON:  10/05/2014  FINDINGS: The cardiac silhouette, mediastinal and hilar contours are within normal limits and stable. The lungs are clear. No pleural effusion. The bony thorax is intact.  IMPRESSION: No acute cardiopulmonary findings.   Electronically Signed   By: Marijo Sanes [name]   On: 01/25/2015 19:32   I have personally reviewed and evaluated these images and lab results as part of my medical decision-making.   EKG Interpretation   Date/Time:  Thursday January 25 2015 18:36:07 EDT Ventricular Rate:  83 PR Interval:  146 QRS Duration: 96 QT Interval:   348 QTC Calculation: 408 R Axis:   35 Text Interpretation:  Normal sinus rhythm Cannot rule out Inferior infarct  , age undetermined ST \T\ T wave abnormality, consider lateral ischemia  Abnormal ECG Confirmed by FLOYD MD, DANIEL [phone]) on 01/25/2015 7:08:20 PM     Meds given in ED:  Medications - No data to display  Discharge Medication List as of 01/25/2015 11:34 PM     Filed Vitals:   01/25/15 2100 01/25/15 2115 01/25/15 2130 01/25/15 2200  BP: 120/79 133/88 120/79 133/75  Pulse: 65 64 65 63  Temp:      TempSrc:      Resp: _0 SpO2: 100% 99% 99% 98%    MDM  Vitals stable - WNL -afebrile Pt resting comfortably in ED. PE--physical exam as above and not concerning Labwork--delta troponin is negative, EKG unchanged from previous. Imaging--chest x-ray shows no acute cardiopulmonary pathology.  DDX--patient with three-day history of intermittent atypical chest pain, worse with deep respiration and more pleuritic in nature. Low suspicion for emergent cardiopulmonary pathology. Encouraged cessation of smoking and follow-up with primary care.  I discussed all relevant lab findings and imaging results with pt and they verbalized understanding. Discussed f/u with PCP within 48 hrs and return precautions, pt very amenable to plan. Prior to patient discharge, I discussed and reviewed this case with Dr. Tyrone Nine   Final diagnoses:  Chest discomfort       Comer Locket, PA-C 01/26/15 [place], DO 01/26/15 1539

## 2015-01-25 NOTE — ED Notes (Signed)
Pt verbalizes understanding of d/c instructions and denies any further needs at this time. 

## 2015-05-26 ENCOUNTER — Emergency Department (HOSPITAL_COMMUNITY)
Admission: EM | Admit: 2015-05-26 | Discharge: 2015-05-26 | Disposition: A | Discharge status: Home / Self Care | Payer: PPO | Attending: Emergency Medicine | Admitting: Emergency Medicine

## 2015-05-26 ENCOUNTER — Encounter (HOSPITAL_COMMUNITY): Payer: Self-pay | Admitting: Emergency Medicine

## 2015-05-26 DIAGNOSIS — R631 Polydipsia: Secondary | ICD-10-CM | POA: Insufficient documentation

## 2015-05-26 DIAGNOSIS — E119 Type 2 diabetes mellitus without complications: Secondary | ICD-10-CM | POA: Insufficient documentation

## 2015-05-26 DIAGNOSIS — Z87448 Personal history of other diseases of urinary system: Secondary | ICD-10-CM | POA: Diagnosis not present

## 2015-05-26 DIAGNOSIS — F17210 Nicotine dependence, cigarettes, uncomplicated: Secondary | ICD-10-CM | POA: Diagnosis not present

## 2015-05-26 DIAGNOSIS — R1013 Epigastric pain: Secondary | ICD-10-CM | POA: Insufficient documentation

## 2015-05-26 DIAGNOSIS — Z9049 Acquired absence of other specified parts of digestive tract: Secondary | ICD-10-CM | POA: Diagnosis not present

## 2015-05-26 DIAGNOSIS — Z79899 Other long term (current) drug therapy: Secondary | ICD-10-CM | POA: Diagnosis not present

## 2015-05-26 DIAGNOSIS — Z862 Personal history of diseases of the blood and blood-forming organs and certain disorders involving the immune mechanism: Secondary | ICD-10-CM | POA: Insufficient documentation

## 2015-05-26 DIAGNOSIS — R1012 Left upper quadrant pain: Secondary | ICD-10-CM | POA: Insufficient documentation

## 2015-05-26 DIAGNOSIS — R112 Nausea with vomiting, unspecified: Secondary | ICD-10-CM | POA: Insufficient documentation

## 2015-05-26 DIAGNOSIS — L219 Seborrheic dermatitis, unspecified: Secondary | ICD-10-CM | POA: Diagnosis not present

## 2015-05-26 DIAGNOSIS — R358 Other polyuria: Secondary | ICD-10-CM | POA: Diagnosis not present

## 2015-05-26 DIAGNOSIS — I10 Essential (primary) hypertension: Secondary | ICD-10-CM | POA: Diagnosis not present

## 2015-05-26 DIAGNOSIS — Z8739 Personal history of other diseases of the musculoskeletal system and connective tissue: Secondary | ICD-10-CM | POA: Diagnosis not present

## 2015-05-26 DIAGNOSIS — Z794 Long term (current) use of insulin: Secondary | ICD-10-CM | POA: Insufficient documentation

## 2015-05-26 LAB — CBC
HCT: 39.8 % (ref 39.0–52.0)
Hemoglobin: 13.3 g/dL (ref 13.0–17.0)
MCH: 30.3 pg (ref 26.0–34.0)
MCHC: 33.4 g/dL (ref 30.0–36.0)
MCV: 90.7 fL (ref 78.0–100.0)
PLATELETS: 259 10*3/uL (ref 150–400)
RBC: 4.39 MIL/uL (ref 4.22–5.81)
RDW: 13.3 % (ref 11.5–15.5)
WBC: 7.8 10*3/uL (ref 4.0–10.5)

## 2015-05-26 LAB — COMPREHENSIVE METABOLIC PANEL
ALK PHOS: 82 U/L (ref 38–126)
ALT: 25 U/L (ref 17–63)
AST: 26 U/L (ref 15–41)
Albumin: 3.9 g/dL (ref 3.5–5.0)
Anion gap: 9 (ref 5–15)
BUN: 24 mg/dL — AB (ref 6–20)
CALCIUM: 9.5 mg/dL (ref 8.9–10.3)
CHLORIDE: 99 mmol/L — AB (ref 101–111)
CO2: 28 mmol/L (ref 22–32)
CREATININE: 1.7 mg/dL — AB (ref 0.61–1.24)
GFR, EST AFRICAN AMERICAN: 57 mL/min — AB (ref >60)
GFR, EST NON AFRICAN AMERICAN: 49 mL/min — AB (ref >60)
Glucose, Bld: 130 mg/dL — ABNORMAL HIGH (ref 65–99)
Potassium: 4.4 mmol/L (ref 3.5–5.1)
SODIUM: 136 mmol/L (ref 135–145)
Total Bilirubin: 0.6 mg/dL (ref 0.3–1.2)
Total Protein: 8.5 g/dL — ABNORMAL HIGH (ref 6.5–8.1)

## 2015-05-26 LAB — URINE MICROSCOPIC-ADD ON: BACTERIA UA: NONE SEEN

## 2015-05-26 LAB — URINALYSIS, ROUTINE W REFLEX MICROSCOPIC
Bilirubin Urine: NEGATIVE
GLUCOSE, UA: NEGATIVE mg/dL
HGB URINE DIPSTICK: NEGATIVE
KETONES UR: NEGATIVE mg/dL
Leukocytes, UA: NEGATIVE
Nitrite: NEGATIVE
PROTEIN: 100 mg/dL — AB
Specific Gravity, Urine: 1.02 (ref 1.005–1.030)
pH: 6.5 (ref 5.0–8.0)

## 2015-05-26 LAB — LIPASE, BLOOD: LIPASE: 28 U/L (ref 11–51)

## 2015-05-26 MED ORDER — ONDANSETRON HCL 4 MG/2ML IJ SOLN
4.0000 mg | Freq: Once | INTRAMUSCULAR | Status: AC
  Administered 2015-05-26: 4 mg via INTRAVENOUS
  Filled 2015-05-26: qty 2

## 2015-05-26 MED ORDER — HYDROMORPHONE HCL 1 MG/ML IJ SOLN
1.0000 mg | Freq: Once | INTRAMUSCULAR | Status: AC
  Administered 2015-05-26: 1 mg via INTRAVENOUS
  Filled 2015-05-26: qty 1

## 2015-05-26 MED ORDER — METOCLOPRAMIDE HCL 5 MG/ML IJ SOLN
10.0000 mg | Freq: Once | INTRAMUSCULAR | Status: AC
  Administered 2015-05-26: 10 mg via INTRAVENOUS
  Filled 2015-05-26: qty 2

## 2015-05-26 MED ORDER — SODIUM CHLORIDE 0.9 % IV BOLUS (SEPSIS)
1000.0000 mL | Freq: Once | INTRAVENOUS | Status: AC
Start: 2015-05-26 — End: 2015-05-26
  Administered 2015-05-26: 1000 mL via INTRAVENOUS

## 2015-05-26 MED ORDER — ONDANSETRON 4 MG PO TBDP
ORAL_TABLET | ORAL | Status: AC
  Filled 2015-05-26: qty 1

## 2015-05-26 MED ORDER — ONDANSETRON 4 MG PO TBDP
4.0000 mg | ORAL_TABLET | Freq: Once | ORAL | Status: AC
  Administered 2015-05-26: 4 mg via ORAL

## 2015-05-26 MED ORDER — METOCLOPRAMIDE HCL 10 MG PO TABS: 10.0000 mg | ORAL_TABLET | Freq: Four times a day (QID) | ORAL | Status: DC

## 2015-05-26 NOTE — ED Notes (Signed)
Pt. Stated, Lavenia Atlasve been N/V for 2 days.  Feet swollen.  Im also a diabetic.

## 2015-05-26 NOTE — ED Provider Notes (Signed)
CSN: [id number]     Arrival date & time 05/26/15  1102 History   First MD Initiated Contact with Patient 05/26/15 1226     Chief Complaint  Patient presents with  . Foot Pain  . Nausea  . Emesis     (Consider location/radiation/quality/duration/timing/severity/associated sxs/prior Treatment) HPI Comments: Patients with history of insulin-dependent diabetes, gastroparesis, toe amputations, cholecystectomy -- presents with complaint of left-sided upper abdominal pain, vomiting for the past 2 days. Patient has had swelling in his feet for the past 4 days. States that his sugars have been well controlled. He is uncertain if this feels like previous episodes of gastroparesis. He does not know if he has ever had diabetic ketoacidosis. No fevers, chest pain or cough. No diarrhea. Last bowel movement was this morning. Patient states frequent urination and thirst, however this is not unusual for him and not worse than baseline. Onset of symptoms acute. Course is constant. Nothing makes symptoms better or worse.  The history is provided by the patient.    Past Medical History  Diagnosis Date  . Hypertension   . Diabetes mellitus     type 2 iddm x 18 yrs  . Vascular disease     poor circulation to left foot  . Acute osteomyelitis, ankle and foot 07/30/2010    Qualifier: Diagnosis of  By: Tommy Medal MD, Roderic Scarce    . Anemia   . Renal insufficiency   . Hiatal hernia   . DDD (degenerative disc disease), lumbar   . Pancreatitis   . Gastroparesis   . Collagen vascular disease ([place])   . GERD (gastroesophageal reflux disease)    Past Surgical History  Procedure Laterality Date  . Cholecystectomy    . Toe amputation  2012    left foot; great toe and second toe  . Amputation  04/25/2011    Procedure: AMPUTATION DIGIT;  Surgeon: Newt Minion, MD;  Location: [place];  Service: Orthopedics;  Laterality: Left;  Left foot 3rd toe amputation MTP joint, Gastroc Recession  Achilles Lengthening   .  Amputation Bilateral 03/25/2013    Procedure: AMPUTATION RAY;  Surgeon: Newt Minion, MD;  Location: [place];  Service: Orthopedics;  Laterality: Bilateral;  Left Great Toe Amputation at  MTP Joint, Right 1st and 2nd Ray Amputation   . Amputation Right 10/03/2014    Procedure: AMPUTATION MIDFOOT;  Surgeon: Newt Minion, MD;  Location: [place];  Service: Orthopedics;  Laterality: Right;   Family History  Problem Relation Age of Onset  . Heart attack Father 36  . Hypertension Sister    Social History  Substance Use Topics  . Smoking status: Current Every Day Smoker -- 0.10 packs/day for 3 years    Types: Cigarettes  . Smokeless tobacco: Never Used  . Alcohol Use: No    Review of Systems  Constitutional: Negative for fever.  HENT: Negative for rhinorrhea and sore throat.   Eyes: Negative for redness.  Respiratory: Negative for cough.   Cardiovascular: Negative for chest pain.  Gastrointestinal: Positive for nausea, vomiting and abdominal pain. Negative for diarrhea.  Endocrine: Positive for polydipsia and polyuria.  Genitourinary: Negative for dysuria.  Musculoskeletal: Negative for myalgias.  Skin: Negative for rash.  Neurological: Negative for headaches.      Allergies  Review of patient's allergies indicates no known allergies.  Home Medications   Prior to Admission medications   Medication Sig Start Date End Date Taking? Authorizing Provider  Amino Acids-Protein Hydrolys (FEEDING SUPPLEMENT, PRO-STAT SUGAR  FREE 64,) LIQD Take 30 mLs by mouth 2 (two) times daily. Patient not taking: Reported on 01/25/2015 10/05/14   Belkys A Regalado, MD  amLODipine (NORVASC) 2.5 MG tablet Take 1 tablet (2.5 mg total) by mouth daily. Patient not taking: Reported on 01/25/2015 10/05/14   Belkys A Regalado, MD  blood glucose meter kit and supplies KIT Dispense based on patient and insurance preference. Use to check blood sugar two times daily as directed. (FOR ICD-9 250.00, 250.01). 09/30/14    Barton Dubois, MD  docusate sodium (COLACE) 100 MG capsule Take 1 capsule (100 mg total) by mouth 2 (two) times daily. Patient not taking: Reported on 11/03/2014 10/05/14   Belkys A Regalado, MD  doxycycline (VIBRA-TABS) 100 MG tablet Take 1 tablet (100 mg total) by mouth 2 (two) times daily. Patient not taking: Reported on 11/03/2014 10/05/14   Belkys A Regalado, MD  gabapentin (NEURONTIN) 100 MG capsule Take 2 capsules (200 mg total) by mouth 3 (three) times daily. 05/30/14   Albertine Patricia, MD  HYDROcodone-acetaminophen (NORCO/VICODIN) 5-325 MG per tablet Take 1 tablet by mouth every 6 (six) hours as needed for severe pain. Patient not taking: Reported on 01/25/2015 10/05/14   Belkys A Regalado, MD  insulin aspart (NOVOLOG) 100 UNIT/ML injection Inject 3 Units into the skin 3 (three) times daily with meals. 10/05/14   Belkys A Regalado, MD  insulin glargine (LANTUS) 100 UNIT/ML injection Inject 0.15 mLs (15 Units total) into the skin at bedtime. 10/05/14   Belkys A Regalado, MD  Insulin Pen Needle 31G X 5 MM MISC Use one needle to inject insulin with flexpen as indicated twice a day Patient not taking: Reported on 10/01/2014 09/30/14   Barton Dubois, MD  lidocaine (XYLOCAINE) 2 % solution Use as directed 20 mLs in the mouth or throat as needed for mouth pain. Patient not taking: Reported on 01/25/2015 01/16/15   Larene Pickett, PA-C  lisinopril (PRINIVIL,ZESTRIL) 20 MG tablet Take 20 mg by mouth daily.    Historical Provider, MD  naproxen (NAPROSYN) 500 MG tablet Take 1 tablet (500 mg total) by mouth 2 (two) times daily with a meal. Patient not taking: Reported on 01/25/2015 01/16/15   Larene Pickett, PA-C  pantoprazole (PROTONIX) 40 MG tablet Take 1 tablet (40 mg total) by mouth 2 (two) times daily. Patient not taking: Reported on 10/01/2014 09/30/14   Barton Dubois, MD  polyethylene glycol [facility] / Floria Raveling) packet Take 17 g by mouth 2 (two) times daily. Patient not taking: Reported on 11/03/2014 10/05/14    Belkys A Regalado, MD   BP 158/109 mmHg  Pulse 68  Temp(Src) 98.3 F (36.8 C) (Oral)  Resp 20  Ht _0  (1.854 m)  Wt 104.327 kg  BMI 30.35 kg/m2  SpO2 100% Physical Exam  Constitutional: He appears well-developed and well-nourished.  HENT:  Head: Normocephalic and atraumatic.  Mouth/Throat: Oropharynx is clear and moist.  Eyes: Conjunctivae are normal. Right eye exhibits no discharge. Left eye exhibits no discharge.  Neck: Normal range of motion. Neck supple.  Cardiovascular: Normal rate, regular rhythm and normal heart sounds.   No murmur heard. Pulmonary/Chest: Effort normal and breath sounds normal. No respiratory distress. He has no wheezes. He has no rales.  Abdominal: Soft. There is tenderness (moderate, left upper quadrant and in epigastrium). There is no rebound and no guarding.  Musculoskeletal: He exhibits no edema or tenderness.  Patient with previous history of toe amputation (5 on right, 3 on left). Surgical wounds  are well-healed without drainage. There is no skin warmth or erythema. Trace edema. No signs of active infection.  Neurological: He is alert.  Skin: Skin is warm and dry.  Psychiatric: He has a normal mood and affect.  Nursing note and vitals reviewed.   ED Course  Procedures (including critical care time) Labs Review Labs Reviewed  COMPREHENSIVE METABOLIC PANEL - Abnormal; Notable for the following:    Chloride 99 (*)    Glucose, Bld 130 (*)    BUN 24 (*)    Creatinine, Ser 1.70 (*)    Total Protein 8.5 (*)    GFR calc non Af Amer 49 (*)    GFR calc Af Amer 57 (*)    All other components within normal limits  URINALYSIS, ROUTINE W REFLEX MICROSCOPIC (NOT AT [facility]) - Abnormal; Notable for the following:    Protein, ur 100 (*)    All other components within normal limits  URINE MICROSCOPIC-ADD ON - Abnormal; Notable for the following:    Squamous Epithelial / LPF 0-5 (*)    All other components within normal limits  LIPASE, BLOOD  CBC     Imaging Review No results found. I have personally reviewed and evaluated these images and lab results as part of my medical decision-making.   EKG Interpretation None       12:43 PM Patient seen and examined. Work-up initiated. Medications ordered.   Vital signs reviewed and are as follows: BP 158/109 mmHg  Pulse 68  Temp(Src) 98.3 F (36.8 C) (Oral)  Resp 20  Ht _0  (1.854 m)  Wt 104.327 kg  BMI 30.35 kg/m2  SpO2 100%  4:40 PM patient improved in emergency department after several doses of antiemetics and pain medication. Abdomen is soft and nontender. He has tolerated ginger ale without vomiting. Patient states that he is feeling better and wants to go home. He is in good spirits.  Patient encouraged to return if symptoms worsen or vomiting becomes persistent. Discharge to home with Reglan.  The patient was urged to return to the Emergency Department immediately with worsening of current symptoms, worsening abdominal pain, persistent vomiting, blood noted in stools, fever, or any other concerns. The patient verbalized understanding.     MDM   Final diagnoses:  Non-intractable vomiting with nausea, vomiting of unspecified type   Patient with diabetes, vomiting, abdominal pain. Labs are reassuring. No leukocytosis. No elevated anion gap or other evidence of DKA. Renal function at baseline. No urinary tract infection. Symptoms controlled in emergency department with treatment as above. Return instructions as above. Vitals are stable, no fever.  No signs of dehydration, tolerating PO's. Lungs are clear. No focal abdominal pain, no concern for appendicitis, cholecystitis, pancreatitis, ruptured viscus, UTI, kidney stone, or any other abdominal etiology. Supportive therapy indicated with return if symptoms worsen. Patient counseled.     Carlisle Cater, PA-C 05/26/15 [place], MD 05/26/15 [phone]

## 2015-05-26 NOTE — Discharge Instructions (Signed)
Please read and follow all provided instructions.  Your diagnoses today include:  1. Non-intractable vomiting with nausea, vomiting of unspecified type     Tests performed today include:  Blood counts and electrolytes  Blood tests to check liver and kidney function  Blood tests to check pancreas function  Urine test to look for infection  Vital signs. See below for your results today.   Medications prescribed:   Reglan (metoclopramide) - for nausea and vomiting  Take any prescribed medications only as directed.  Home care instructions:   Follow any educational materials contained in this packet.   Drink clear liquids for the next 24 hours and introduce solid foods slowly after 24 hours using the b.r.a.t. diet (Bananas, Rice, Applesauce, Toast, Yogurt).    Follow-up instructions: Please follow-up with your primary care provider in the next 2-3 days for further evaluation of your symptoms. If you are not feeling better in 48 hours you may have a condition that is more serious and you need re-evaluation.   Return instructions:  SEEK IMMEDIATE MEDICAL ATTENTION IF:  If you have pain that does not go away or becomes severe   A temperature above 101F develops   Repeated vomiting occurs (multiple episodes)   If you have pain that becomes localized to portions of the abdomen. The right side could possibly be appendicitis. In an adult, the left lower portion of the abdomen could be colitis or diverticulitis.   Blood is being passed in stools or vomit (bright red or black tarry stools)   You develop chest pain, difficulty breathing, dizziness or fainting, or become confused, poorly responsive, or inconsolable (young children)  If you have any other emergent concerns regarding your health  Additional Information: Abdominal (belly) pain can be caused by many things. Your caregiver performed an examination and possibly ordered blood/urine tests and imaging (CT scan, x-rays,  ultrasound). Many cases can be observed and treated at home after initial evaluation in the emergency department. Even though you are being discharged home, abdominal pain can be unpredictable. Therefore, you need a repeated exam if your pain does not resolve, returns, or worsens. Most patients with abdominal pain don't have to be admitted to the hospital or have surgery, but serious problems like appendicitis and gallbladder attacks can start out as nonspecific pain. Many abdominal conditions cannot be diagnosed in one visit, so follow-up evaluations are very important.  Your vital signs today were: BP 137/90 mmHg   Pulse 91   Temp(Src) 98.3 F (36.8 C) (Oral)   Resp 16   Ht 6\' 1"  (1.854 m)   Wt 104.327 kg   BMI 30.35 kg/m2   SpO2 98% If your blood pressure (bp) was elevated above 135/85 this visit, please have this repeated by your doctor within one month. --------------

## 2015-05-28 ENCOUNTER — Encounter (HOSPITAL_COMMUNITY): Payer: Self-pay | Admitting: Emergency Medicine

## 2015-05-28 ENCOUNTER — Observation Stay (HOSPITAL_COMMUNITY): Payer: PPO

## 2015-05-28 ENCOUNTER — Observation Stay (HOSPITAL_COMMUNITY)
Admission: EM | Admit: 2015-05-28 | Discharge: 2015-05-29 | Disposition: A | Discharge status: Home / Self Care | Payer: PPO | Attending: Internal Medicine | Admitting: Internal Medicine

## 2015-05-28 DIAGNOSIS — F1210 Cannabis abuse, uncomplicated: Secondary | ICD-10-CM | POA: Insufficient documentation

## 2015-05-28 DIAGNOSIS — R1110 Vomiting, unspecified: Secondary | ICD-10-CM

## 2015-05-28 DIAGNOSIS — E1143 Type 2 diabetes mellitus with diabetic autonomic (poly)neuropathy: Secondary | ICD-10-CM | POA: Diagnosis not present

## 2015-05-28 DIAGNOSIS — N183 Chronic kidney disease, stage 3 unspecified: Secondary | ICD-10-CM | POA: Diagnosis present

## 2015-05-28 DIAGNOSIS — Z89431 Acquired absence of right foot: Secondary | ICD-10-CM | POA: Diagnosis not present

## 2015-05-28 DIAGNOSIS — K3184 Gastroparesis: Secondary | ICD-10-CM | POA: Diagnosis present

## 2015-05-28 DIAGNOSIS — I129 Hypertensive chronic kidney disease with stage 1 through stage 4 chronic kidney disease, or unspecified chronic kidney disease: Secondary | ICD-10-CM | POA: Insufficient documentation

## 2015-05-28 DIAGNOSIS — E1151 Type 2 diabetes mellitus with diabetic peripheral angiopathy without gangrene: Secondary | ICD-10-CM | POA: Diagnosis not present

## 2015-05-28 DIAGNOSIS — E1165 Type 2 diabetes mellitus with hyperglycemia: Secondary | ICD-10-CM | POA: Diagnosis not present

## 2015-05-28 DIAGNOSIS — IMO0002 Reserved for concepts with insufficient information to code with codable children: Secondary | ICD-10-CM | POA: Diagnosis present

## 2015-05-28 DIAGNOSIS — E1122 Type 2 diabetes mellitus with diabetic chronic kidney disease: Secondary | ICD-10-CM | POA: Insufficient documentation

## 2015-05-28 DIAGNOSIS — Z794 Long term (current) use of insulin: Secondary | ICD-10-CM | POA: Insufficient documentation

## 2015-05-28 DIAGNOSIS — F17210 Nicotine dependence, cigarettes, uncomplicated: Secondary | ICD-10-CM | POA: Diagnosis not present

## 2015-05-28 HISTORY — DX: Chronic kidney disease, stage 3 (moderate): N18.3

## 2015-05-28 HISTORY — DX: Peripheral vascular disease, unspecified: I73.9

## 2015-05-28 HISTORY — DX: Hyperlipidemia, unspecified: E78.5

## 2015-05-28 HISTORY — DX: Type 2 diabetes mellitus without complications: E11.9

## 2015-05-28 HISTORY — DX: Chronic kidney disease, stage 3 unspecified: N18.30

## 2015-05-28 LAB — CBC WITH DIFFERENTIAL/PLATELET
BASOS ABS: 0 10*3/uL (ref 0.0–0.1)
Basophils Relative: 0 %
EOS ABS: 0.1 10*3/uL (ref 0.0–0.7)
EOS PCT: 1 %
HCT: 36.5 % — ABNORMAL LOW (ref 39.0–52.0)
HEMOGLOBIN: 11.9 g/dL — AB (ref 13.0–17.0)
LYMPHS PCT: 19 %
Lymphs Abs: 1.5 10*3/uL (ref 0.7–4.0)
MCH: 29.8 pg (ref 26.0–34.0)
MCHC: 32.6 g/dL (ref 30.0–36.0)
MCV: 91.5 fL (ref 78.0–100.0)
Monocytes Absolute: 0.5 10*3/uL (ref 0.1–1.0)
Monocytes Relative: 7 %
NEUTROS PCT: 73 %
Neutro Abs: 5.7 10*3/uL (ref 1.7–7.7)
PLATELETS: 214 10*3/uL (ref 150–400)
RBC: 3.99 MIL/uL — AB (ref 4.22–5.81)
RDW: 13.6 % (ref 11.5–15.5)
WBC: 7.8 10*3/uL (ref 4.0–10.5)

## 2015-05-28 LAB — COMPREHENSIVE METABOLIC PANEL
ALT: 21 U/L (ref 17–63)
ANION GAP: 10 (ref 5–15)
AST: 24 U/L (ref 15–41)
Albumin: 3.6 g/dL (ref 3.5–5.0)
Alkaline Phosphatase: 72 U/L (ref 38–126)
BUN: 20 mg/dL (ref 6–20)
CHLORIDE: 102 mmol/L (ref 101–111)
CO2: 27 mmol/L (ref 22–32)
CREATININE: 1.66 mg/dL — AB (ref 0.61–1.24)
Calcium: 9.5 mg/dL (ref 8.9–10.3)
GFR, EST AFRICAN AMERICAN: 59 mL/min — AB (ref >60)
GFR, EST NON AFRICAN AMERICAN: 51 mL/min — AB (ref >60)
Glucose, Bld: 156 mg/dL — ABNORMAL HIGH (ref 65–99)
Potassium: 4 mmol/L (ref 3.5–5.1)
SODIUM: 139 mmol/L (ref 135–145)
Total Bilirubin: 0.3 mg/dL (ref 0.3–1.2)
Total Protein: 7.8 g/dL (ref 6.5–8.1)

## 2015-05-28 LAB — GLUCOSE, CAPILLARY
GLUCOSE-CAPILLARY: 123 mg/dL — AB (ref 65–99)
Glucose-Capillary: 86 mg/dL (ref 65–99)
Glucose-Capillary: 109 mg/dL — ABNORMAL HIGH (ref 65–99)

## 2015-05-28 LAB — CBG MONITORING, ED: GLUCOSE-CAPILLARY: 135 mg/dL — AB (ref 65–99)

## 2015-05-28 LAB — TSH: TSH: 0.494 u[IU]/mL (ref 0.350–4.500)

## 2015-05-28 LAB — LIPASE, BLOOD: LIPASE: 31 U/L (ref 11–51)

## 2015-05-28 MED ORDER — PROMETHAZINE HCL 25 MG/ML IJ SOLN
25.0000 mg | Freq: Once | INTRAMUSCULAR | Status: AC
  Administered 2015-05-28: 25 mg via INTRAVENOUS
  Filled 2015-05-28: qty 1

## 2015-05-28 MED ORDER — METOCLOPRAMIDE HCL 5 MG/ML IJ SOLN
10.0000 mg | Freq: Once | INTRAMUSCULAR | Status: AC
  Administered 2015-05-28: 10 mg via INTRAVENOUS
  Filled 2015-05-28: qty 2

## 2015-05-28 MED ORDER — LISINOPRIL 20 MG PO TABS
20.0000 mg | ORAL_TABLET | Freq: Every day | ORAL | Status: DC
  Administered 2015-05-29: 20 mg via ORAL
  Filled 2015-05-28 (×2): qty 1

## 2015-05-28 MED ORDER — ACETAMINOPHEN 650 MG RE SUPP: 650.0000 mg | Freq: Three times a day (TID) | RECTAL | Status: DC

## 2015-05-28 MED ORDER — ONDANSETRON HCL 4 MG/2ML IJ SOLN
4.0000 mg | Freq: Four times a day (QID) | INTRAMUSCULAR | Status: DC
  Administered 2015-05-28 – 2015-05-29 (×5): 4 mg via INTRAVENOUS
  Filled 2015-05-28 (×5): qty 2

## 2015-05-28 MED ORDER — KETOROLAC TROMETHAMINE 30 MG/ML IJ SOLN: 30.0000 mg | Freq: Four times a day (QID) | INTRAMUSCULAR | Status: DC | PRN

## 2015-05-28 MED ORDER — ACETAMINOPHEN 500 MG PO TABS
1000.0000 mg | ORAL_TABLET | Freq: Three times a day (TID) | ORAL | Status: DC
  Administered 2015-05-28 – 2015-05-29 (×2): 1000 mg via ORAL
  Filled 2015-05-28 (×3): qty 2

## 2015-05-28 MED ORDER — SODIUM CHLORIDE 0.9 % IV SOLN: INTRAVENOUS | Status: AC

## 2015-05-28 MED ORDER — INSULIN ASPART 100 UNIT/ML ~~LOC~~ SOLN
0.0000 [IU] | SUBCUTANEOUS | Status: DC
  Administered 2015-05-28: 1 [IU] via SUBCUTANEOUS

## 2015-05-28 MED ORDER — FENTANYL CITRATE (PF) 100 MCG/2ML IJ SOLN
100.0000 ug | Freq: Once | INTRAMUSCULAR | Status: AC
  Administered 2015-05-28: 100 ug via INTRAVENOUS
  Filled 2015-05-28: qty 2

## 2015-05-28 MED ORDER — METOCLOPRAMIDE HCL 5 MG/ML IJ SOLN
10.0000 mg | Freq: Three times a day (TID) | INTRAMUSCULAR | Status: DC
  Administered 2015-05-28 – 2015-05-29 (×4): 10 mg via INTRAVENOUS
  Filled 2015-05-28 (×4): qty 2

## 2015-05-28 MED ORDER — OXYCODONE HCL 5 MG PO TABS: 5.0000 mg | ORAL_TABLET | Freq: Three times a day (TID) | ORAL | Status: DC | PRN

## 2015-05-28 MED ORDER — SODIUM CHLORIDE 0.9 % IV BOLUS (SEPSIS)
1000.0000 mL | Freq: Once | INTRAVENOUS | Status: AC
  Administered 2015-05-28: 1000 mL via INTRAVENOUS

## 2015-05-28 MED ORDER — HEPARIN SODIUM (PORCINE) 5000 UNIT/ML IJ SOLN
5000.0000 [IU] | Freq: Three times a day (TID) | INTRAMUSCULAR | Status: DC
  Administered 2015-05-28 – 2015-05-29 (×3): 5000 [IU] via SUBCUTANEOUS
  Filled 2015-05-28 (×3): qty 1

## 2015-05-28 MED ORDER — BISACODYL 10 MG RE SUPP: 10.0000 mg | Freq: Every day | RECTAL | Status: DC | PRN

## 2015-05-28 MED ORDER — HYDRALAZINE HCL 20 MG/ML IJ SOLN: 10.0000 mg | Freq: Four times a day (QID) | INTRAMUSCULAR | Status: DC | PRN

## 2015-05-28 MED ORDER — ALUM & MAG HYDROXIDE-SIMETH 200-200-20 MG/5ML PO SUSP: 30.0000 mL | Freq: Four times a day (QID) | ORAL | Status: DC | PRN

## 2015-05-28 MED ORDER — ONDANSETRON HCL 4 MG/2ML IJ SOLN
4.0000 mg | Freq: Once | INTRAMUSCULAR | Status: AC
  Administered 2015-05-28: 4 mg via INTRAVENOUS
  Filled 2015-05-28: qty 2

## 2015-05-28 MED ORDER — SODIUM CHLORIDE 0.9 % IV SOLN
250.0000 mg | Freq: Three times a day (TID) | INTRAVENOUS | Status: DC
  Administered 2015-05-28 – 2015-05-29 (×3): 250 mg via INTRAVENOUS
  Filled 2015-05-28 (×6): qty 5

## 2015-05-28 MED ORDER — SODIUM CHLORIDE 0.9 % IV SOLN
INTRAVENOUS | Status: DC
  Administered 2015-05-28: 14:00:00 via INTRAVENOUS

## 2015-05-28 MED ORDER — INSULIN GLARGINE 100 UNIT/ML ~~LOC~~ SOLN
12.0000 [IU] | Freq: Every day | SUBCUTANEOUS | Status: DC
  Administered 2015-05-28: 12 [IU] via SUBCUTANEOUS
  Filled 2015-05-28 (×2): qty 0.12

## 2015-05-28 MED ORDER — GABAPENTIN 100 MG PO CAPS
200.0000 mg | ORAL_CAPSULE | Freq: Three times a day (TID) | ORAL | Status: DC
  Administered 2015-05-28 – 2015-05-29 (×3): 200 mg via ORAL
  Filled 2015-05-28 (×4): qty 2

## 2015-05-28 NOTE — ED Notes (Signed)
Pt here with c/o left upper quadrant pain also with n/v , pt was seen and d/c sat from ed with same complaint

## 2015-05-28 NOTE — H&P (Signed)
[facility] History and Physical                                                                                    [name], is a 39 y.o. male  MRN: [id number]   DOB - September 14, 1975  Admit Date - 05/28/2015  Outpatient Primary MD for the patient is [name], MD  Referring Physician:  Dr.  Alvino Chapel  Chief Complaint:   Chief Complaint  Patient presents with  . Nausea  . Emesis     HPI  [name]  is a 39 y.o. male, with PMH of IDDM with gastroparesis, HTN, CKD III presents to the ER with vomiting x 4 days.  He is unable to keep down any by mouth intake including his medications.  His symptoms started 4 days ago.  He has significant LLQ abdominal pain but no fever or obvious sign of infection.  This morning there was a small amount of red blood in his emesis.  The patient was seen in the ER on 12/17 for the same symptoms.  He was treated,  improved and discharged. Unfortunately today he has not improved with treatment in the ER. Aside from his chief complaint he mentions that his lower extremities are mildly swollen left greater than right. He has no open wounds.  In the emergency department his creatinine is 1.66 (below baseline) glucose is 156, he has no elevation of his anion gap. LFTs are within normal limits. Two-view abdominal x-ray is pending. EKG is pending.  Review of Systems  Constitutional: Positive for malaise/fatigue. Negative for fever and chills.  HENT: Negative for congestion and sore throat.   Eyes: Negative.   Respiratory: Negative for cough, sputum production and shortness of breath.   Cardiovascular: Positive for leg swelling. Negative for chest pain.  Gastrointestinal: Positive for nausea, vomiting and abdominal pain. Negative for blood in stool.  Genitourinary: Negative.   Musculoskeletal: Negative.   Skin: Negative.   Neurological: Positive for headaches.  Endo/Heme/Allergies: Negative.   Psychiatric/Behavioral: Negative.      Past  Medical History  Past Medical History  Diagnosis Date  . Hypertension   . Diabetes mellitus     type 2 iddm x 18 yrs  . Vascular disease     poor circulation to left foot  . Acute osteomyelitis, ankle and foot 07/30/2010    Qualifier: Diagnosis of  By: Tommy Medal MD, Roderic Scarce    . Anemia   . Renal insufficiency   . Hiatal hernia   . DDD (degenerative disc disease), lumbar   . Pancreatitis   . Gastroparesis   . Collagen vascular disease ([place])   . GERD (gastroesophageal reflux disease)     Past Surgical History  Procedure Laterality Date  . Cholecystectomy    . Toe amputation  2012    left foot; great toe and second toe  . Amputation  04/25/2011    Procedure: AMPUTATION DIGIT;  Surgeon: Newt Minion, MD;  Location: [place];  Service: Orthopedics;  Laterality: Left;  Left foot 3rd toe amputation MTP joint, Gastroc Recession  Achilles Lengthening   . Amputation Bilateral 03/25/2013    Procedure: AMPUTATION RAY;  Surgeon: Newt Minion, MD;  Location: [place];  Service: Orthopedics;  Laterality: Bilateral;  Left Great Toe Amputation at  MTP Joint, Right 1st and 2nd Ray Amputation   . Amputation Right 10/03/2014    Procedure: AMPUTATION MIDFOOT;  Surgeon: Newt Minion, MD;  Location: [place];  Service: Orthopedics;  Laterality: Right;      Social History Social History  Substance Use Topics  . Smoking status: Current Every Day Smoker -- 0.10 packs/day for 3 years    Types: Cigarettes  . Smokeless tobacco: Never Used  . Alcohol Use: No   he continues to smoke 1 cigarette daily. He refuses a nicotine patch.  Family History Family History  Problem Relation Age of Onset  . Heart attack Father 91  . Hypertension Sister     Prior to Admission medications   Medication Sig Start Date End Date Taking? Authorizing Provider  blood glucose meter kit and supplies KIT Dispense based on patient and insurance preference. Use to check blood sugar two times daily as directed. (FOR ICD-9 250.00,  250.01). 09/30/14  Yes Barton Dubois, MD  gabapentin (NEURONTIN) 100 MG capsule Take 2 capsules (200 mg total) by mouth 3 (three) times daily. 05/30/14  Yes Albertine Patricia, MD  HYDROcodone-acetaminophen (NORCO/VICODIN) 5-325 MG per tablet Take 1 tablet by mouth every 6 (six) hours as needed for severe pain. 10/05/14  Yes Belkys A Regalado, MD  insulin aspart (NOVOLOG) 100 UNIT/ML injection Inject 3 Units into the skin 3 (three) times daily with meals. 10/05/14  Yes Belkys A Regalado, MD  insulin glargine (LANTUS) 100 UNIT/ML injection Inject 0.15 mLs (15 Units total) into the skin at bedtime. 10/05/14  Yes Belkys A Regalado, MD  Insulin Pen Needle 31G X 5 MM MISC Use one needle to inject insulin with flexpen as indicated twice a day 09/30/14  Yes Barton Dubois, MD  lisinopril (PRINIVIL,ZESTRIL) 20 MG tablet Take 20 mg by mouth daily.   Yes Historical Provider, MD  metoCLOPramide (REGLAN) 10 MG tablet Take 1 tablet (10 mg total) by mouth every 6 (six) hours. 05/26/15  Yes Carlisle Cater, PA-C    No Known Allergies  Physical Exam  Vitals  Blood pressure 130/70, pulse 97, temperature 97.6 F (36.4 C), temperature source Oral, resp. rate 16, height '6\' 1"'$  (1.854 m), weight 104.327 kg (230 lb), SpO2 100 %.   General:  Cooperative, well-developed male  lying in bed in NAD  Psych:  Normal affect and insight, Not Suicidal or Homicidal, Awake Alert, Oriented X 3.  Neuro:   No F.N deficits, ALL C.Nerves Intact, Strength 5/5 all 4 extremities, Sensation intact all 4 extremities.  ENT:  Ears and Eyes appear Normal, Conjunctivae clear, PER. Moist oral mucosa without erythema or exudates.  Neck:  Supple, No lymphadenopathy appreciated  Respiratory:  Symmetrical chest wall movement, Good air movement bilaterally, CTAB.  Cardiac:  RRR, No Murmurs, no LE edema noted, no JVD.    Abdomen:  Tender palpation of the left lower quadrant, soft, nondistended, I did not hear bowel sounds.  Skin:  No Cyanosis,  Normal Skin Turgor, No Skin Rash or Bruise.  Extremities:  Able to move all 4. 5/5 strength in each,  no effusions.  He has a transmetatarsal amputation on the right and his first 3 toes have been amputated on the left.  Data Review  Wt Readings from Last 3 Encounters:  05/28/15 104.327 kg (230 lb)  05/26/15 104.327 kg (230 lb)  01/16/15 117.028 kg (258  lb)    CBC  Recent Labs Lab 05/26/15 1151 05/28/15 0742  WBC 7.8 7.8  HGB 13.3 11.9*  HCT 39.8 36.5*  PLT 259 214  MCV 90.7 91.5  MCH 30.3 29.8  MCHC 33.4 32.6  RDW 13.3 13.6  LYMPHSABS  --  1.5  MONOABS  --  0.5  EOSABS  --  0.1  BASOSABS  --  0.0    Chemistries   Recent Labs Lab 05/26/15 1151 05/28/15 0742  NA 136 139  K 4.4 4.0  CL 99* 102  CO2 28 27  GLUCOSE 130* 156*  BUN 24* 20  CREATININE 1.70* 1.66*  CALCIUM 9.5 9.5  AST 26 24  ALT 25 21  ALKPHOS 82 72  BILITOT 0.6 0.3       Lab Results  Component Value Date   HGBA1C 13.6* 09/28/2014    CREATININE: 1.66 mg/dL ABNORMAL (05/28/15 0742) Estimated creatinine clearance - 76.6 mL/min   Urinalysis    Component Value Date/Time   COLORURINE YELLOW 05/26/2015 [place] 05/26/2015 1246   LABSPEC 1.020 05/26/2015 1246   PHURINE 6.5 05/26/2015 [place] 05/26/2015 1246   Blank[place] 05/26/2015 [place] 05/26/2015 [place] 05/26/2015 1246   PROTEINUR 100* 05/26/2015 1246   UROBILINOGEN 2.0* 09/27/2014 1844   NITRITE NEGATIVE 05/26/2015 1246   LEUKOCYTESUR NEGATIVE 05/26/2015 1246    Imaging results:   No results found.  My personal review of EKG: Pending   Assessment & Plan  Principal Problem:   Gastroparesis Active Problems:   DM (diabetes mellitus) type II uncontrolled, periph vascular disorder (HCC)   CKD (chronic kidney disease), stage III   Diabetic gastroparesis We'll check a two-view abdomen to rule out other etiologies of vomiting.  Start scheduled  IV reglan and emycin.  Will place on clears.  Will give scheduled tylenol for pain.  Up to 5 doses of IV toradol for moderate pain with low dose oxy only for severe pain.  Attempt to eliminate narcotic and anticholinergics.  Encourage ambulation.  Slowly advance to low residue diet.  Type II Diabetes Hgb A1C in April 2016 was 13.6.  Current is pending.   Place on basal/bolus insulin regimen while inpatient, titrate insulin accordingly.  CKD stage III Likely due to diabetes.  Creatinine currently at baseline.   Will monitor.     Consultants Called:    none  Family Communication:     Patient is alert, orientated and understands their plan of care.  Code Status:    full  Condition:    Guarded.  Potential Disposition:   To home 12/20 or 12/21 if he can tolerate a diet.  Time spent in minutes : [place],  [place] on 05/28/2015 at 12:24 PM Between 7am to 7pm - Pager - [phone] After 7pm go to [email or web address] - password [email or web address] And look for the night coverage person covering me after hours

## 2015-05-28 NOTE — Progress Notes (Signed)
NURSING PROGRESS NOTE  [name] [id number] Admission Data: 05/28/2015 2:49 PM Attending Provider: No att. providers found ZDG:LOVFIEP,PIRJJPCP:AVBUERE,EDWIN A, MD Code Status: Full code   [name] is a 39 y.o. male patient admitted from ED:  -No acute distress noted.  -No complaints of shortness of breath.  -No complaints of chest pain.    Blood pressure 159/77, pulse 79, temperature 99.7 F (37.6 C), temperature source Oral, resp. rate 18, height 6\' 1"  (1.854 m), weight 108.2 kg (238 lb 8.6 oz), SpO2 98 %.   IV Fluids:  IV in place, occlusive dsg intact without redness, IV cath forearm right, condition patent and no redness.  Allergies:  Review of patient's allergies indicates no known allergies.  Past Medical History:   has a past medical history of Hypertension; Diabetes mellitus; Vascular disease; Acute osteomyelitis, ankle and foot (07/30/2010); Anemia; Renal insufficiency; Hiatal hernia; DDD (degenerative disc disease), lumbar; Pancreatitis; Gastroparesis; Collagen vascular disease (HCC); and GERD (gastroesophageal reflux disease).  Past Surgical History:   has past surgical history that includes Cholecystectomy; Toe amputation (2012); Amputation (04/25/2011); Amputation (Bilateral, 03/25/2013); and Amputation (Right, 10/03/2014).  Social History:   reports that he has been smoking Cigarettes.  He has a .3 pack-year smoking history. He has never used smokeless tobacco. He reports that he uses illicit drugs (Marijuana) about 10 times per week. He reports that he does not drink alcohol.    Patient alert and orientated to room, drowsy at this time but easily arouseable. Information packet given to patient. Admission inpatient armband information verified with patient/family to include name and date of birth and placed on patient arm. Side rails up x 2, fall assessment and education completed with patient. Patient able to verbalize understanding of risk associated with falls and verbalized  understanding to call for assistance before getting out of bed. Call light within reach. Patient able to voice and demonstrate understanding of unit orientation instructions.    Will continue to evaluate and treat per MD orders.     Roma KayserStephanie Sanjay Broadfoot, RN

## 2015-05-28 NOTE — ED Notes (Signed)
Pt given ginger-ale and unable to keep it down. Dr. Rubin PayorPickering made aware pt nauseous and having left lower abd pain.

## 2015-05-28 NOTE — ED Notes (Signed)
Report attempted 

## 2015-05-28 NOTE — ED Provider Notes (Signed)
CSN: [id number]     Arrival date & time 05/28/15  0703 History   First MD Initiated Contact with Patient 05/28/15 [phone]     Chief Complaint  Patient presents with  . Nausea  . Emesis     (Consider location/radiation/quality/duration/timing/severity/associated sxs/prior Treatment) Patient is a 39 y.o. male presenting with vomiting. The history is provided by the patient.  Emesis Associated symptoms: abdominal pain   Associated symptoms: no chills, no diarrhea and no headaches    patient with nausea and vomiting. Seen 2 days ago for same. History of gastroparesis. 2 days ago felt better and was sent home. States that last night began to hurt again. Denies diarrhea. LUQ pain. No diarrhea. No fevers. No recent change in medications.   Past Medical History  Diagnosis Date  . Hypertension   . Diabetes mellitus     type 2 iddm x 18 yrs  . Vascular disease     poor circulation to left foot  . Acute osteomyelitis, ankle and foot 07/30/2010    Qualifier: Diagnosis of  By: Tommy Medal MD, Roderic Scarce    . Anemia   . Renal insufficiency   . Hiatal hernia   . DDD (degenerative disc disease), lumbar   . Pancreatitis   . Gastroparesis   . Collagen vascular disease ([place])   . GERD (gastroesophageal reflux disease)    Past Surgical History  Procedure Laterality Date  . Cholecystectomy    . Toe amputation  2012    left foot; great toe and second toe  . Amputation  04/25/2011    Procedure: AMPUTATION DIGIT;  Surgeon: Newt Minion, MD;  Location: [facility];  Service: Orthopedics;  Laterality: Left;  Left foot 3rd toe amputation MTP joint, Gastroc Recession  Achilles Lengthening   . Amputation Bilateral 03/25/2013    Procedure: AMPUTATION RAY;  Surgeon: Newt Minion, MD;  Location: [facility];  Service: Orthopedics;  Laterality: Bilateral;  Left Great Toe Amputation at  MTP Joint, Right 1st and 2nd Ray Amputation   . Amputation Right 10/03/2014    Procedure: AMPUTATION MIDFOOT;  Surgeon: Newt Minion, MD;   Location: [facility];  Service: Orthopedics;  Laterality: Right;   Family History  Problem Relation Age of Onset  . Heart attack Father 69  . Hypertension Sister    Social History  Substance Use Topics  . Smoking status: Current Every Day Smoker -- 0.10 packs/day for 3 years    Types: Cigarettes  . Smokeless tobacco: Never Used  . Alcohol Use: No    Review of Systems  Constitutional: Positive for appetite change. Negative for fever, chills and activity change.  Eyes: Negative for pain.  Respiratory: Negative for chest tightness and shortness of breath.   Cardiovascular: Negative for chest pain and leg swelling.  Gastrointestinal: Positive for nausea, vomiting and abdominal pain. Negative for diarrhea.  Genitourinary: Negative for flank pain.  Musculoskeletal: Negative for back pain and neck stiffness.  Skin: Negative for rash.  Neurological: Negative for weakness, numbness and headaches.  Psychiatric/Behavioral: Negative for behavioral problems.      Allergies  Review of patient's allergies indicates no known allergies.  Home Medications   Prior to Admission medications   Medication Sig Start Date End Date Taking? Authorizing Provider  blood glucose meter kit and supplies KIT Dispense based on patient and insurance preference. Use to check blood sugar two times daily as directed. (FOR ICD-9 250.00, 250.01). 09/30/14  Yes Barton Dubois, MD  gabapentin (NEURONTIN) 100 MG capsule  Take 2 capsules (200 mg total) by mouth 3 (three) times daily. 05/30/14  Yes Albertine Patricia, MD  HYDROcodone-acetaminophen (NORCO/VICODIN) 5-325 MG per tablet Take 1 tablet by mouth every 6 (six) hours as needed for severe pain. 10/05/14  Yes Belkys A Regalado, MD  insulin aspart (NOVOLOG) 100 UNIT/ML injection Inject 3 Units into the skin 3 (three) times daily with meals. 10/05/14  Yes Belkys A Regalado, MD  insulin glargine (LANTUS) 100 UNIT/ML injection Inject 0.15 mLs (15 Units total) into the skin at  bedtime. 10/05/14  Yes Belkys A Regalado, MD  Insulin Pen Needle 31G X 5 MM MISC Use one needle to inject insulin with flexpen as indicated twice a day 09/30/14  Yes Barton Dubois, MD  lisinopril (PRINIVIL,ZESTRIL) 20 MG tablet Take 20 mg by mouth daily.   Yes Historical Provider, MD  metoCLOPramide (REGLAN) 10 MG tablet Take 1 tablet (10 mg total) by mouth every 6 (six) hours. 05/26/15  Yes Joshua Geiple, PA-C   BP 159/77 mmHg  Pulse 79  Temp(Src) 99.7 F (37.6 C) (Oral)  Resp 18  Ht _0  (1.854 m)  Wt 238 lb 8.6 oz (108.2 kg)  BMI 31.48 kg/m2  SpO2 98% Physical Exam  Constitutional: He appears well-developed.  HENT:  Head: Atraumatic.  Neck: Neck supple.  Cardiovascular: Normal rate.   Pulmonary/Chest: Effort normal.  Abdominal: There is tenderness.   Epigastric to left upper quadrant tenderness without rebound or guarding.  Musculoskeletal: Normal range of motion.  Neurological: He is alert.    ED Course  Procedures (including critical care time) Labs Review Labs Reviewed  COMPREHENSIVE METABOLIC PANEL - Abnormal; Notable for the following:    Glucose, Bld 156 (*)    Creatinine, Ser 1.66 (*)    GFR calc non Af Amer 51 (*)    GFR calc Af Amer 59 (*)    All other components within normal limits  CBC WITH DIFFERENTIAL/PLATELET - Abnormal; Notable for the following:    RBC 3.99 (*)    Hemoglobin 11.9 (*)    HCT 36.5 (*)    All other components within normal limits  CBG MONITORING, ED - Abnormal; Notable for the following:    Glucose-Capillary 135 (*)    All other components within normal limits  LIPASE, BLOOD    Imaging Review Dg Abd 2 Views  05/28/2015  CLINICAL DATA:  Nausea, vomiting and left upper quadrant pain for 4 days. EXAM: ABDOMEN - 2 VIEW COMPARISON:  CT abdomen and pelvis 09/27/2014. Chest and two views of the abdomen 08/28/2013. FINDINGS: The bowel gas pattern is normal. No free intraperitoneal air is identified. No abnormal abdominal calcification is  seen. Cholecystectomy clips are noted. No focal bony abnormality. IMPRESSION: Negative exam. Electronically Signed   By: Inge Rise [name]   On: 05/28/2015 12:39   I have personally reviewed and evaluated these images and lab results as part of my medical decision-making.   EKG Interpretation None      MDM   Final diagnoses:  Gastroparesis    patient with nausea and vomiting. History gastroparesis. Likely the same. Has had previously delayed gastric emptying study. Still vomiting after treatment. Will admit to internal medicine.    Davonna Belling, MD 05/28/15 [phone]

## 2015-05-29 DIAGNOSIS — Z794 Long term (current) use of insulin: Secondary | ICD-10-CM | POA: Diagnosis not present

## 2015-05-29 DIAGNOSIS — E119 Type 2 diabetes mellitus without complications: Secondary | ICD-10-CM

## 2015-05-29 DIAGNOSIS — K3184 Gastroparesis: Secondary | ICD-10-CM | POA: Diagnosis not present

## 2015-05-29 DIAGNOSIS — E1143 Type 2 diabetes mellitus with diabetic autonomic (poly)neuropathy: Secondary | ICD-10-CM | POA: Diagnosis not present

## 2015-05-29 DIAGNOSIS — N183 Chronic kidney disease, stage 3 (moderate): Secondary | ICD-10-CM | POA: Diagnosis not present

## 2015-05-29 LAB — BASIC METABOLIC PANEL
ANION GAP: 8 (ref 5–15)
BUN: 14 mg/dL (ref 6–20)
CHLORIDE: 106 mmol/L (ref 101–111)
CO2: 24 mmol/L (ref 22–32)
Calcium: 8.8 mg/dL — ABNORMAL LOW (ref 8.9–10.3)
Creatinine, Ser: 1.56 mg/dL — ABNORMAL HIGH (ref 0.61–1.24)
GFR calc non Af Amer: 55 mL/min — ABNORMAL LOW (ref >60)
GLUCOSE: 117 mg/dL — AB (ref 65–99)
Potassium: 3.7 mmol/L (ref 3.5–5.1)
Sodium: 138 mmol/L (ref 135–145)

## 2015-05-29 LAB — GLUCOSE, CAPILLARY
GLUCOSE-CAPILLARY: 90 mg/dL (ref 65–99)
GLUCOSE-CAPILLARY: 100 mg/dL — AB (ref 65–99)
Glucose-Capillary: 85 mg/dL (ref 65–99)
Glucose-Capillary: 94 mg/dL (ref 65–99)

## 2015-05-29 LAB — HEMOGLOBIN A1C
Hgb A1c MFr Bld: 6.4 % — ABNORMAL HIGH (ref 4.8–5.6)
Mean Plasma Glucose: 137 mg/dL

## 2015-05-29 MED ORDER — METOCLOPRAMIDE HCL 10 MG PO TABS: 10.0000 mg | ORAL_TABLET | Freq: Three times a day (TID) | ORAL | Status: DC

## 2015-05-29 NOTE — Progress Notes (Signed)
Pt given discharge instructions, prescriptions, and care notes. Pt verbalized understanding AEB no further questions or concerns at this time. IV was discontinued, no redness, pain, or swelling noted at this time. Pt left the floor via ambulation with staff in stable condition. 

## 2015-05-29 NOTE — Care Management Note (Addendum)
Case Management Note  Patient Details  Name: [name] MRN: [id number] Date of Birth: 09/20/1975  Subjective/Objective:   Patient for dc, no needs.   Nursing staff dc patient before NCM could give  Observation notification.               Action/Plan:   Expected Discharge Date:                  Expected Discharge Plan:  Home/Self Care  In-House Referral:     Discharge planning Services  CM Consult  Post Acute Care Choice:    Choice offered to:     DME Arranged:    DME Agency:     HH Arranged:    HH Agency:     Status of Service:  Completed, signed off  Medicare Important Message Given:    Date Medicare IM Given:    Medicare IM give by:    Date Additional Medicare IM Given:    Additional Medicare Important Message give by:     If discussed at Long Length of Stay Meetings, dates discussed:    Additional Comments:  Leone Havenaylor, Grier Czerwinski [place], RN 05/29/2015, 1:40 PM

## 2015-05-29 NOTE — Discharge Summary (Signed)
Discharge Summary  [name] YOV:[id number] DOB: 09-08-1975  PCP: Curtis Fendt, MD  Admit date: 05/28/2015 Discharge date: 05/29/2015  Time spent: <36mns  Recommendations for Outpatient Follow-up:  1. F/u with PMD within Clark week, repeat bmp at follow up, pmd to refer patient to GI for history of recurrent n/v.  Discharge Diagnoses:  Active Hospital Problems   Diagnosis Date Noted  . Gastroparesis 05/28/2015  . CKD (chronic kidney disease), stage III 03/23/2013  . DM (diabetes mellitus) type II uncontrolled, periph vascular disorder ([name] 03/23/2013    Resolved Hospital Problems   Diagnosis Date Noted Date Resolved  No resolved problems to display.    Discharge Condition: stable  Diet recommendation: heart healthy/carb modified  Filed Weights   05/28/15 0713 05/28/15 1327  Weight: 230 lb (104.327 kg) 238 lb 8.6 oz (108.2 kg)    History of present illness:  [name] Clark 39y.o. male, with PMH of IDDM with gastroparesis, HTN, CKD III presents to the ER with vomiting x 4 days. He is unable to keep down any by mouth intake including his medications. His symptoms started 4 days ago. He has significant LLQ abdominal pain but no fever or obvious sign of infection. This morning there was Clark small amount of red blood in his emesis. The patient was seen in the ER on 12/17 for the same symptoms. He was treated, improved and discharged. Unfortunately today he has not improved with treatment in the ER. Aside from his chief complaint he mentions that his lower extremities are mildly swollen left greater than right. He has no open wounds.  In the emergency department his creatinine is 1.66 (below baseline) glucose is 156, he has no elevation of his anion gap. LFTs are within normal limits. Two-view abdominal x-ray is pending. EKG NSR.  Hospital Course:  Principal Problem:   Gastroparesis Active Problems:   DM (diabetes mellitus) type II uncontrolled, periph vascular  disorder (HCC)   CKD (chronic kidney disease), stage III  Diabetic gastroparesis two-view abdomen no acute findings. was treated with ivf,  IV reglan and emycin.  Symptom resolved, tolerating diet advancement, no pain, no n/v, laughing and joking with friends in room. He want to go home. He reported run out of reglan for Clark few months, reglan prescribed at discharge.  he also admit to using marijuana. Advised patient marijuana could also contribute to n/v. Patient explained understanding.  Insulin dependent Type II Diabetes Hgb A1C in April 2016 was 13.6. a1c this admission 6.4, patient reported he has been trying to loose weight and he is more careful with his diet. He reported he was in denial of diabetes diagnosis until he underwent toe amputation this year. Now he is trying to get his diabetes under control.  Continue home regimen, close follow up with pmd  CKD stage III Likely due to diabetes. Creatinine currently at baseline.   Procedures:  none  Consultations:  none  Discharge Exam: BP 140/78 mmHg  Pulse 72  Temp(Src) 98.9 F (37.2 C) (Oral)  Resp 18  Ht '6\' 1"'$  (1.854 m)  Wt 238 lb 8.6 oz (108.2 kg)  BMI 31.48 kg/m2  SpO2 99%  General: aaox3, NAD Cardiovascular: RRR Respiratory: CTABL Ab: soft, NT/ND, positive bowel sounds  Discharge Instructions You were cared for by Clark hospitalist during your hospital stay. If you have any questions about your discharge medications or the care you received while you were in the hospital after you are discharged, you can call the unit and  asked to speak with the hospitalist on call if the hospitalist that took care of you is not available. Once you are discharged, your primary care physician will handle any further medical issues. Please note that NO REFILLS for any discharge medications will be authorized once you are discharged, as it is imperative that you return to your primary care physician (or establish Clark relationship with  Clark primary care physician if you do not have one) for your aftercare needs so that they can reassess your need for medications and monitor your lab values.  Discharge Instructions    Diet - low sodium heart healthy    Complete by:  As directed   Carb modified     Increase activity slowly    Complete by:  As directed             Medication List    TAKE these medications        blood glucose meter kit and supplies Kit  Dispense based on patient and insurance preference. Use to check blood sugar two times daily as directed. (FOR ICD-9 250.00, 250.01).     gabapentin 100 MG capsule  Commonly known as:  NEURONTIN  Take 2 capsules (200 mg total) by mouth 3 (three) times daily.     HYDROcodone-acetaminophen 5-325 MG tablet  Commonly known as:  NORCO/VICODIN  Take 1 tablet by mouth every 6 (six) hours as needed for severe pain.     insulin aspart 100 UNIT/ML injection  Commonly known as:  novoLOG  Inject 3 Units into the skin 3 (three) times daily with meals.     insulin glargine 100 UNIT/ML injection  Commonly known as:  LANTUS  Inject 0.15 mLs (15 Units total) into the skin at bedtime.     Insulin Pen Needle 31G X 5 MM Misc  Use one needle to inject insulin with flexpen as indicated twice Clark day     lisinopril 20 MG tablet  Commonly known as:  PRINIVIL,ZESTRIL  Take 20 mg by mouth daily.     metoCLOPramide 10 MG tablet  Commonly known as:  REGLAN  Take 1 tablet (10 mg total) by mouth 4 (four) times daily -  before meals and at bedtime.       No Known Allergies     Follow-up Information    Follow up with Curtis A, MD In 1 week.   Specialty:  Internal Medicine   Why:  hospital discharge follow up, pmd to refer to GI for possible egd   Contact information:   [place] [address] [phone]        The results of significant diagnostics from this hospitalization (including imaging, microbiology, ancillary and laboratory) are listed below  for reference.    Significant Diagnostic Studies: Dg Abd 2 Views  05/28/2015  CLINICAL DATA:  Nausea, vomiting and left upper quadrant pain for 4 days. EXAM: ABDOMEN - 2 VIEW COMPARISON:  CT abdomen and pelvis 09/27/2014. Chest and two views of the abdomen 08/28/2013. FINDINGS: The bowel gas pattern is normal. No free intraperitoneal air is identified. No abnormal abdominal calcification is seen. Cholecystectomy clips are noted. No focal bony abnormality. IMPRESSION: Negative exam. Electronically Signed   By: Inge Rise [name]   On: 05/28/2015 12:39    Microbiology: No results found for this or any previous visit (from the past 240 hour(s)).   Labs: Basic Metabolic Panel:  Recent Labs Lab 05/26/15 1151 05/28/15 0742 05/29/15 0713  NA 136 139 138  K 4.4 4.0 3.7  CL 99* 102 106  CO2 '28 27 24  '$ GLUCOSE 130* 156* 117*  BUN 24* 20 14  CREATININE 1.70* 1.66* 1.56*  CALCIUM 9.5 9.5 8.8*   Liver Function Tests:  Recent Labs Lab 05/26/15 1151 05/28/15 0742  AST 26 24  ALT 25 21  ALKPHOS 82 72  BILITOT 0.6 0.3  PROT 8.5* 7.8  ALBUMIN 3.9 3.6    Recent Labs Lab 05/26/15 1151 05/28/15 0742  LIPASE 28 31   No results for input(s): AMMONIA in the last 168 hours. CBC:  Recent Labs Lab 05/26/15 1151 05/28/15 0742  WBC 7.8 7.8  NEUTROABS  --  5.7  HGB 13.3 11.9*  HCT 39.8 36.5*  MCV 90.7 91.5  PLT 259 214   Cardiac Enzymes: No results for input(s): CKTOTAL, CKMB, CKMBINDEX, TROPONINI in the last 168 hours. BNP: BNP (last 3 results)  Recent Labs  05/30/14 0041  BNP 19.2    ProBNP (last 3 results) No results for input(s): PROBNP in the last 8760 hours.  CBG:  Recent Labs Lab 05/28/15 1700 05/28/15 2055 05/29/15 0025 05/29/15 0442 05/29/15 0808  GLUCAP 86 109* 85 90 100*       SignedFlorencia Reasons MD, PhD  [facility] 05/29/2015, 11:54 AM

## 2015-05-29 NOTE — Progress Notes (Signed)
Nutrition Consult/Brief Note  RD consulted for nutrition education regarding gastroparesis.  Lab Results  Component Value Date   HGBA1C 6.4* 05/28/2015    RD provided "Gastroparesis Nutrition Therapy" handout from the Academy of [employer] and Dietetics.   Body mass index is 31.48 kg/(m^2). Pt meets criteria for Obesity Class I based on current BMI.  Patient discharging today.  Maureen ChattersKatie Lessie Manigo, RD, LDN Pager #: [phone] After-Hours Pager #: [phone]

## 2015-05-30 ENCOUNTER — Encounter (HOSPITAL_COMMUNITY): Payer: Self-pay | Admitting: *Deleted

## 2015-05-30 ENCOUNTER — Emergency Department (HOSPITAL_COMMUNITY)
Admission: EM | Admit: 2015-05-30 | Discharge: 2015-05-30 | Discharge status: Against Medical Advice | Payer: PPO | Attending: Emergency Medicine | Admitting: Emergency Medicine

## 2015-05-30 DIAGNOSIS — R109 Unspecified abdominal pain: Secondary | ICD-10-CM | POA: Insufficient documentation

## 2015-05-30 DIAGNOSIS — N183 Chronic kidney disease, stage 3 (moderate): Secondary | ICD-10-CM | POA: Diagnosis not present

## 2015-05-30 DIAGNOSIS — R112 Nausea with vomiting, unspecified: Secondary | ICD-10-CM | POA: Insufficient documentation

## 2015-05-30 DIAGNOSIS — E119 Type 2 diabetes mellitus without complications: Secondary | ICD-10-CM | POA: Diagnosis not present

## 2015-05-30 DIAGNOSIS — I129 Hypertensive chronic kidney disease with stage 1 through stage 4 chronic kidney disease, or unspecified chronic kidney disease: Secondary | ICD-10-CM | POA: Diagnosis not present

## 2015-05-30 DIAGNOSIS — F17210 Nicotine dependence, cigarettes, uncomplicated: Secondary | ICD-10-CM | POA: Diagnosis not present

## 2015-05-30 LAB — COMPREHENSIVE METABOLIC PANEL
ALBUMIN: 3.6 g/dL (ref 3.5–5.0)
ALK PHOS: 66 U/L (ref 38–126)
ALT: 18 U/L (ref 17–63)
AST: 20 U/L (ref 15–41)
Anion gap: 8 (ref 5–15)
BILIRUBIN TOTAL: 0.5 mg/dL (ref 0.3–1.2)
BUN: 20 mg/dL (ref 6–20)
CALCIUM: 9.7 mg/dL (ref 8.9–10.3)
CO2: 30 mmol/L (ref 22–32)
Chloride: 102 mmol/L (ref 101–111)
Creatinine, Ser: 1.54 mg/dL — ABNORMAL HIGH (ref 0.61–1.24)
GFR calc Af Amer: >60 mL/min (ref >60)
GFR, EST NON AFRICAN AMERICAN: 56 mL/min — AB (ref >60)
Glucose, Bld: 138 mg/dL — ABNORMAL HIGH (ref 65–99)
POTASSIUM: 3.9 mmol/L (ref 3.5–5.1)
SODIUM: 140 mmol/L (ref 135–145)
TOTAL PROTEIN: 7.4 g/dL (ref 6.5–8.1)

## 2015-05-30 LAB — CBC
HEMATOCRIT: 34 % — AB (ref 39.0–52.0)
Hemoglobin: 11.4 g/dL — ABNORMAL LOW (ref 13.0–17.0)
MCH: 30.2 pg (ref 26.0–34.0)
MCHC: 33.5 g/dL (ref 30.0–36.0)
MCV: 89.9 fL (ref 78.0–100.0)
PLATELETS: 233 10*3/uL (ref 150–400)
RBC: 3.78 MIL/uL — ABNORMAL LOW (ref 4.22–5.81)
RDW: 13.3 % (ref 11.5–15.5)
WBC: 11.2 10*3/uL — AB (ref 4.0–10.5)

## 2015-05-30 LAB — LIPASE, BLOOD: LIPASE: 32 U/L (ref 11–51)

## 2015-05-30 MED ORDER — ONDANSETRON 4 MG PO TBDP
4.0000 mg | ORAL_TABLET | Freq: Once | ORAL | Status: AC | PRN
  Administered 2015-05-30: 4 mg via ORAL
  Filled 2015-05-30: qty 1

## 2015-05-30 NOTE — ED Notes (Signed)
No answer from waiting room.

## 2015-05-30 NOTE — ED Notes (Signed)
Pt complains of left sided abdominal pain, nausea, vomiting since this morning. Pt was diagnosed with [facility] at [facility] ago with similar symptoms. Pt states he threw up his BP medication, hypertensive for EMS, 200/102.

## 2015-05-31 ENCOUNTER — Emergency Department (HOSPITAL_COMMUNITY): Payer: PPO

## 2015-05-31 ENCOUNTER — Emergency Department (HOSPITAL_COMMUNITY)
Admission: EM | Admit: 2015-05-31 | Discharge: 2015-05-31 | Disposition: A | Discharge status: Home / Self Care | Payer: PPO | Attending: Emergency Medicine | Admitting: Emergency Medicine

## 2015-05-31 ENCOUNTER — Encounter (HOSPITAL_COMMUNITY): Payer: Self-pay

## 2015-05-31 DIAGNOSIS — Z862 Personal history of diseases of the blood and blood-forming organs and certain disorders involving the immune mechanism: Secondary | ICD-10-CM | POA: Insufficient documentation

## 2015-05-31 DIAGNOSIS — I129 Hypertensive chronic kidney disease with stage 1 through stage 4 chronic kidney disease, or unspecified chronic kidney disease: Secondary | ICD-10-CM | POA: Insufficient documentation

## 2015-05-31 DIAGNOSIS — E119 Type 2 diabetes mellitus without complications: Secondary | ICD-10-CM | POA: Insufficient documentation

## 2015-05-31 DIAGNOSIS — N183 Chronic kidney disease, stage 3 (moderate): Secondary | ICD-10-CM | POA: Insufficient documentation

## 2015-05-31 DIAGNOSIS — Z79899 Other long term (current) drug therapy: Secondary | ICD-10-CM | POA: Insufficient documentation

## 2015-05-31 DIAGNOSIS — R1012 Left upper quadrant pain: Secondary | ICD-10-CM | POA: Diagnosis not present

## 2015-05-31 DIAGNOSIS — K219 Gastro-esophageal reflux disease without esophagitis: Secondary | ICD-10-CM | POA: Insufficient documentation

## 2015-05-31 DIAGNOSIS — R112 Nausea with vomiting, unspecified: Secondary | ICD-10-CM | POA: Diagnosis present

## 2015-05-31 DIAGNOSIS — Z794 Long term (current) use of insulin: Secondary | ICD-10-CM | POA: Diagnosis not present

## 2015-05-31 DIAGNOSIS — Z8739 Personal history of other diseases of the musculoskeletal system and connective tissue: Secondary | ICD-10-CM | POA: Insufficient documentation

## 2015-05-31 DIAGNOSIS — K3184 Gastroparesis: Secondary | ICD-10-CM | POA: Diagnosis not present

## 2015-05-31 DIAGNOSIS — F17210 Nicotine dependence, cigarettes, uncomplicated: Secondary | ICD-10-CM | POA: Insufficient documentation

## 2015-05-31 LAB — RAPID URINE DRUG SCREEN, HOSP PERFORMED
AMPHETAMINES: NOT DETECTED
Barbiturates: NOT DETECTED
Benzodiazepines: NOT DETECTED
Cocaine: NOT DETECTED
Opiates: POSITIVE — AB
TETRAHYDROCANNABINOL: POSITIVE — AB

## 2015-05-31 LAB — COMPREHENSIVE METABOLIC PANEL
ALT: 18 U/L (ref 17–63)
AST: 18 U/L (ref 15–41)
Albumin: 3.3 g/dL — ABNORMAL LOW (ref 3.5–5.0)
Alkaline Phosphatase: 65 U/L (ref 38–126)
Anion gap: 9 (ref 5–15)
BUN: 15 mg/dL (ref 6–20)
CHLORIDE: 98 mmol/L — AB (ref 101–111)
CO2: 30 mmol/L (ref 22–32)
CREATININE: 1.49 mg/dL — AB (ref 0.61–1.24)
Calcium: 9.3 mg/dL (ref 8.9–10.3)
GFR calc Af Amer: >60 mL/min (ref >60)
GFR calc non Af Amer: 58 mL/min — ABNORMAL LOW (ref >60)
Glucose, Bld: 144 mg/dL — ABNORMAL HIGH (ref 65–99)
Potassium: 3.5 mmol/L (ref 3.5–5.1)
Sodium: 137 mmol/L (ref 135–145)
Total Bilirubin: 0.4 mg/dL (ref 0.3–1.2)
Total Protein: 7.1 g/dL (ref 6.5–8.1)

## 2015-05-31 LAB — CBC WITH DIFFERENTIAL/PLATELET
BASOS ABS: 0 10*3/uL (ref 0.0–0.1)
Basophils Relative: 0 %
Eosinophils Absolute: 0.1 10*3/uL (ref 0.0–0.7)
Eosinophils Relative: 1 %
HEMATOCRIT: 34.6 % — AB (ref 39.0–52.0)
HEMOGLOBIN: 11.6 g/dL — AB (ref 13.0–17.0)
LYMPHS PCT: 12 %
Lymphs Abs: 1.2 10*3/uL (ref 0.7–4.0)
MCH: 30.4 pg (ref 26.0–34.0)
MCHC: 33.5 g/dL (ref 30.0–36.0)
MCV: 90.8 fL (ref 78.0–100.0)
Monocytes Absolute: 0.7 10*3/uL (ref 0.1–1.0)
Monocytes Relative: 7 %
NEUTROS ABS: 8.6 10*3/uL — AB (ref 1.7–7.7)
Neutrophils Relative %: 80 %
Platelets: 216 10*3/uL (ref 150–400)
RBC: 3.81 MIL/uL — AB (ref 4.22–5.81)
RDW: 13.5 % (ref 11.5–15.5)
WBC: 10.7 10*3/uL — AB (ref 4.0–10.5)

## 2015-05-31 LAB — URINALYSIS, ROUTINE W REFLEX MICROSCOPIC
Bilirubin Urine: NEGATIVE
GLUCOSE, UA: NEGATIVE mg/dL
Ketones, ur: NEGATIVE mg/dL
Leukocytes, UA: NEGATIVE
Nitrite: NEGATIVE
PH: 7 (ref 5.0–8.0)
Protein, ur: >300 mg/dL — AB
SPECIFIC GRAVITY, URINE: 1.017 (ref 1.005–1.030)

## 2015-05-31 LAB — LIPASE, BLOOD: Lipase: 52 U/L — ABNORMAL HIGH (ref 11–51)

## 2015-05-31 LAB — URINE MICROSCOPIC-ADD ON

## 2015-05-31 MED ORDER — MORPHINE SULFATE (PF) 4 MG/ML IV SOLN
4.0000 mg | Freq: Once | INTRAVENOUS | Status: AC
  Administered 2015-05-31: 4 mg via INTRAVENOUS
  Filled 2015-05-31: qty 1

## 2015-05-31 MED ORDER — IOHEXOL 300 MG/ML  SOLN
50.0000 mL | Freq: Once | INTRAMUSCULAR | Status: AC | PRN
  Administered 2015-05-31: 50 mL via ORAL

## 2015-05-31 MED ORDER — METOCLOPRAMIDE HCL 5 MG/ML IJ SOLN
10.0000 mg | Freq: Once | INTRAMUSCULAR | Status: AC
  Administered 2015-05-31: 10 mg via INTRAVENOUS
  Filled 2015-05-31: qty 2

## 2015-05-31 MED ORDER — PROMETHAZINE HCL 25 MG/ML IJ SOLN
12.5000 mg | Freq: Once | INTRAMUSCULAR | Status: AC
  Administered 2015-05-31: 12.5 mg via INTRAVENOUS
  Filled 2015-05-31: qty 1

## 2015-05-31 MED ORDER — MORPHINE SULFATE (PF) 4 MG/ML IV SOLN
4.0000 mg | Freq: Once | INTRAVENOUS | Status: AC
Start: 2015-05-31 — End: 2015-05-31
  Administered 2015-05-31: 4 mg via INTRAVENOUS
  Filled 2015-05-31: qty 1

## 2015-05-31 MED ORDER — SODIUM CHLORIDE 0.9 % IV BOLUS (SEPSIS)
1000.0000 mL | Freq: Once | INTRAVENOUS | Status: AC
  Administered 2015-05-31: 1000 mL via INTRAVENOUS

## 2015-05-31 MED ORDER — METOCLOPRAMIDE HCL 10 MG PO TABS: 10.0000 mg | ORAL_TABLET | Freq: Four times a day (QID) | ORAL | Status: DC

## 2015-05-31 MED ORDER — PROMETHAZINE HCL 25 MG RE SUPP: 25.0000 mg | Freq: Four times a day (QID) | RECTAL | Status: DC | PRN

## 2015-05-31 NOTE — ED Notes (Signed)
Pt of family in hallway being very loud. Requesting to speak with Charge nurse. Same called.

## 2015-05-31 NOTE — ED Provider Notes (Signed)
CSN: [id number]     Arrival date & time 05/31/15  6789 History   First MD Initiated Contact with Patient 05/31/15 [phone]     Chief Complaint  Patient presents with  . Emesis  . Abdominal Pain     (Consider location/radiation/quality/duration/timing/severity/associated sxs/prior Treatment) HPI Anne Sebring is a 39 y.o. male with history of hypertension, insulin-dependent type 2 diabetes, renal insufficiency, pancreatitis, gastroparesis, anemia, presents to emergency department complaining of nausea, vomiting, abdominal pain. Patient states his symptoms started several days ago. Patient reports being admitted to the hospital 4 days ago and discharged 2 days ago. Patient states since being discharged on Reglan, he is unable to keep anything down and has had persistent nausea and vomiting. States he is unable to keep even fluids down. He took EMS to [facility] long hospital yesterday but states that too long in the waiting room and left without being seen. He states he has felt worse overnight so came back to emergency department today. He admits to some diarrhea as well. He reports pain is in the left-side of the abdomen and radiates into the back. He denies blood in his stool or emesis. He states he has been taking Reglan but states he is unable to keep it down. He has not tried any other medications or treatments. He denies smoking marijuana.  Past Medical History  Diagnosis Date  . Hypertension   . Vascular disease     poor circulation to left foot  . Acute osteomyelitis, ankle and foot 07/30/2010    Qualifier: Diagnosis of  By: Tommy Medal MD, Roderic Scarce    . Anemia   . Hiatal hernia   . DDD (degenerative disc disease), lumbar   . Pancreatitis   . Gastroparesis   . Collagen vascular disease ([place])   . GERD (gastroesophageal reflux disease)   . Hyperlipidemia   . Peripheral vascular disease ([place])   . Type II diabetes mellitus ([place]) dx'd ~ 1996  . Renal insufficiency   . Chronic kidney disease  (CKD), stage III (moderate)    Past Surgical History  Procedure Laterality Date  . Toe amputation  2012    left foot; great toe and second toe  . Amputation  04/25/2011    Procedure: AMPUTATION DIGIT;  Surgeon: Newt Minion, MD;  Location: [place];  Service: Orthopedics;  Laterality: Left;  Left foot 3rd toe amputation MTP joint, Gastroc Recession  Achilles Lengthening   . Amputation Bilateral 03/25/2013    Procedure: AMPUTATION RAY;  Surgeon: Newt Minion, MD;  Location: [facility];  Service: Orthopedics;  Laterality: Bilateral;  Left Great Toe Amputation at  MTP Joint, Right 1st and 2nd Ray Amputation   . Amputation Right 10/03/2014    Procedure: AMPUTATION MIDFOOT;  Surgeon: Newt Minion, MD;  Location: [facility];  Service: Orthopedics;  Laterality: Right;  . Laparoscopic cholecystectomy     Family History  Problem Relation Age of Onset  . Heart attack Father 30  . Hypertension Sister    Social History  Substance Use Topics  . Smoking status: Current Every Day Smoker -- 0.10 packs/day for 4 years    Types: Cigarettes  . Smokeless tobacco: Never Used  . Alcohol Use: No    Review of Systems  Constitutional: Negative for fever and chills.  Respiratory: Negative for cough, chest tightness and shortness of breath.   Cardiovascular: Negative for chest pain, palpitations and leg swelling.  Gastrointestinal: Positive for nausea, vomiting, abdominal pain and diarrhea. Negative for abdominal distention.  Genitourinary: Negative for dysuria, urgency, frequency and hematuria.  Musculoskeletal: Negative for myalgias, arthralgias, neck pain and neck stiffness.  Skin: Negative for rash.  Allergic/Immunologic: Negative for immunocompromised state.  Neurological: Negative for dizziness, weakness, light-headedness, numbness and headaches.      Allergies  Review of patient's allergies indicates no known allergies.  Home Medications   Prior to Admission medications   Medication Sig Start Date  End Date Taking? Authorizing Provider  blood glucose meter kit and supplies KIT Dispense based on patient and insurance preference. Use to check blood sugar two times daily as directed. (FOR ICD-9 250.00, 250.01). 09/30/14   Barton Dubois, MD  gabapentin (NEURONTIN) 100 MG capsule Take 2 capsules (200 mg total) by mouth 3 (three) times daily. 05/30/14   Albertine Patricia, MD  HYDROcodone-acetaminophen (NORCO/VICODIN) 5-325 MG per tablet Take 1 tablet by mouth every 6 (six) hours as needed for severe pain. 10/05/14   Belkys A Regalado, MD  insulin aspart (NOVOLOG) 100 UNIT/ML injection Inject 3 Units into the skin 3 (three) times daily with meals. 10/05/14   Belkys A Regalado, MD  insulin glargine (LANTUS) 100 UNIT/ML injection Inject 0.15 mLs (15 Units total) into the skin at bedtime. 10/05/14   Belkys A Regalado, MD  Insulin Pen Needle 31G X 5 MM MISC Use one needle to inject insulin with flexpen as indicated twice a day 09/30/14   Barton Dubois, MD  lisinopril (PRINIVIL,ZESTRIL) 20 MG tablet Take 20 mg by mouth daily.    Historical Provider, MD  metoCLOPramide (REGLAN) 10 MG tablet Take 1 tablet (10 mg total) by mouth 4 (four) times daily -  before meals and at bedtime. 05/29/15   Florencia Reasons, MD   BP 168/96 mmHg  Pulse 71  Temp(Src) 97.6 F (36.4 C) (Oral)  Resp 15  Ht _0  (1.854 m)  Wt 106.595 kg  BMI 31.01 kg/m2  SpO2 98% Physical Exam  Constitutional: He appears well-developed and well-nourished. No distress.  HENT:  Head: Normocephalic and atraumatic.  Eyes: Conjunctivae are normal.  Neck: Neck supple.  Cardiovascular: Normal rate, regular rhythm and normal heart sounds.   Pulmonary/Chest: Effort normal. No respiratory distress. He has no wheezes. He has no rales.  Abdominal: Soft. Bowel sounds are normal. He exhibits no distension. There is tenderness. There is no rebound.  LUQ and LLQ tenderness  Musculoskeletal: He exhibits no edema.  Neurological: He is alert.  Skin: Skin is warm  and dry.  Nursing note and vitals reviewed.   ED Course  Procedures (including critical care time) Labs Review Labs Reviewed  CBC WITH DIFFERENTIAL/PLATELET - Abnormal; Notable for the following:    WBC 10.7 (*)    RBC 3.81 (*)    Hemoglobin 11.6 (*)    HCT 34.6 (*)    Neutro Abs 8.6 (*)    All other components within normal limits  COMPREHENSIVE METABOLIC PANEL - Abnormal; Notable for the following:    Chloride 98 (*)    Glucose, Bld 144 (*)    Creatinine, Ser 1.49 (*)    Albumin 3.3 (*)    GFR calc non Af Amer 58 (*)    All other components within normal limits  LIPASE, BLOOD - Abnormal; Notable for the following:    Lipase 52 (*)    All other components within normal limits  URINALYSIS, ROUTINE W REFLEX MICROSCOPIC (NOT AT [facility]) - Abnormal; Notable for the following:    APPearance CLOUDY (*)    Hgb urine dipstick MODERATE (*)  Protein, ur >300 (*)    All other components within normal limits  URINE RAPID DRUG SCREEN, HOSP PERFORMED - Abnormal; Notable for the following:    Opiates POSITIVE (*)    Tetrahydrocannabinol POSITIVE (*)    All other components within normal limits  URINE MICROSCOPIC-ADD ON - Abnormal; Notable for the following:    Squamous Epithelial / LPF 0-5 (*)    Bacteria, UA RARE (*)    Casts HYALINE CASTS (*)    All other components within normal limits    Imaging Review Ct Abdomen Pelvis Wo Contrast  05/31/2015  CLINICAL DATA:  Left side abdominal pain with vomiting for 4 days. Gastroparesis. EXAM: CT ABDOMEN AND PELVIS WITHOUT CONTRAST TECHNIQUE: Multidetector CT imaging of the abdomen and pelvis was performed following the standard protocol without IV contrast. COMPARISON:  09/27/2014 FINDINGS: Mild cardiomegaly.  Lung bases are clear.  No effusions. Prior cholecystectomy. Liver, spleen, pancreas, adrenals have an unremarkable unenhanced appearance. Slight fullness of the left renal collecting system is stable since prior study. Ectopic right kidney  in a slightly low location. Slight perinephric stranding around both kidneys, stable. No hydronephrosis on the right. No visible renal or ureteral stones. Urinary bladder is mildly thick-walled, similar to prior study. Stomach, large and small bowel are unremarkable. Appendix is normal. Previously seen enlarged right inguinal and external iliac lymph nodes have improved. External iliac lymph node now has a short axis diameter of 7 mm compared with 18 mm previously. Small scattered bilateral inguinal lymph nodes, none pathologically enlarged and decreased since prior study. No free air or free fluid. The aorta is normal caliber. No acute bony abnormality or focal bone lesion. Degenerative changes in the lower lumbar spine. IMPRESSION: Stable mild fullness of the left renal collecting system without visualized stone or overt hydronephrosis. Bladder wall thickening could reflect cystitis. Recommend clinical correlation. Improving right inguinal and external iliac adenopathy. Electronically Signed   By: Rolm Baptise [name]   On: 05/31/2015 11:52   I have personally reviewed and evaluated these images and lab results as part of my medical decision-making.   EKG Interpretation   Date/Time:  Thursday May 31 2015 06:21:08 EST Ventricular Rate:  74 PR Interval:  149 QRS Duration: 89 QT Interval:  370 QTC Calculation: 410 R Axis:   -14 Text Interpretation:  Sinus rhythm STD in inferior leads are now resolved  Confirmed by Glynn Octave [phone]) on 05/31/2015 2:13:21 PM      MDM   Final diagnoses:  Gastroparesis    patient with recurrent nausea and vomiting, also admits to diarrhea. Left sided abdominal tenderness on exam. Discharge from the hospital 2 days ago. Will get fluids running, antiemetics, morphine for pain. Patient has history of diabetic gastroparesis, most likely the same, however patient reports pain is getting worse so will get imaging this time.  8:10 AM Pt reassessed.  Feeling slightly better. Pending labs and CT abd/pelvis  12:35 PM Pts drug screen positive for marijuana. PT tolerating POs in the room.  CT with no acute findings. Pt will be discharged home.   At time of discharge, family became upset. Mother yelling stating that Donnie [place] done nothing for him." she requested admission and GI consult. Explained to her that he would need to follow up. Will give phenergan suppositories and reglan PO. Adivsed to stick to clear liquid diet. Pt's family still upset will have chare nurse come talk to her.   Filed Vitals:   05/31/15 1230 05/31/15 1300 05/31/15 1315  05/31/15 1322  BP: 136/92 149/82 150/104 150/104  Pulse: 78 74 68 75  Temp:    99.3 F (37.4 C)  TempSrc:    Oral  Resp: _0 Height:      Weight:      SpO2: 100% 100% 98% 98%     Jeannett Senior, PA-C 05/31/15 [place], MD 06/01/15 [phone]

## 2015-05-31 NOTE — ED Notes (Signed)
Pt taking po fluids and tolerating well. 

## 2015-05-31 NOTE — ED Notes (Signed)
Patient transported to CT 

## 2015-05-31 NOTE — ED Notes (Signed)
Pt states "I threw my blood pressure pills back up"

## 2015-05-31 NOTE — ED Notes (Signed)
PA at bedside.

## 2015-05-31 NOTE — Discharge Instructions (Signed)
Follow up with gastroenterologist. Stop smoking marijuana. Reglan and phenergan for nausea. No solid food for 48 hrs. Stay with clear liquids   Gastroparesis Gastroparesis, also called delayed gastric emptying, is a condition in which food takes longer than normal to empty from the stomach. The condition is usually long-lasting (chronic). CAUSES This condition may be caused by:  An endocrine disorder, such as hypothyroidism or diabetes. Diabetes is the most common cause of this condition.  A nervous system disease, such as Parkinson disease or multiple sclerosis.  Cancer, infection, or surgery of the stomach or vagus nerve.  A connective tissue disorder, such as scleroderma.  Certain medicines. In most cases, the cause is not known. RISK FACTORS This condition is more likely to develop in:  People with certain disorders, including endocrine disorders, eating disorders, amyloidosis, and scleroderma.  People with certain diseases, including Parkinson disease or multiple sclerosis.  People with cancer or infection of the stomach or vagus nerve.  People who have had surgery on the stomach or vagus nerve.  People who take certain medicines.  Women. SYMPTOMS Symptoms of this condition include:  An early feeling of fullness when eating.  Nausea.  Weight loss.  Vomiting.  Heartburn.  Abdominal bloating.  Inconsistent blood glucose levels.  Lack of appetite.  Acid from the stomach coming up into the esophagus (gastroesophageal reflux).  Spasms of the stomach. Symptoms may come and go. DIAGNOSIS This condition is diagnosed with tests, such as:  Tests that check how long it takes food to move through the stomach and intestines. These tests include:  Upper gastrointestinal (GI) series. In this test, X-rays of the intestines are taken after you drink a liquid. The liquid makes the intestines show up better on the X-rays.  Gastric emptying scintigraphy. In this test,  scans are taken after you eat food that contains a small amount of radioactive material.  Wireless capsule GI monitoring system. This test involves swallowing a capsule that records information about movement through the stomach.  Gastric manometry. This test measures electrical and muscular activity in the stomach. It is done with a thin tube that is passed down the throat and into the stomach.  Endoscopy. This test checks for abnormalities in the lining of the stomach. It is done with a long, thin tube that is passed down the throat and into the stomach.  An ultrasound. This test can help rule out gallbladder disease or pancreatitis as a cause of your symptoms. It uses sound waves to take pictures of the inside of your body. TREATMENT There is no cure for gastroparesis. This condition may be managed with:  Treatment of the underlying condition causing the gastroparesis.  Lifestyle changes, including exercise and dietary changes. Dietary changes can include:  Changes in what and when you eat.  Eating smaller meals more often.  Eating low-fat foods.  Eating low-fiber forms of high-fiber foods, such as cooked vegetables instead of raw vegetables.  Having liquid foods in place of solid foods. Liquid foods are easier to digest.  Medicines. These may be given to control nausea and vomiting and to stimulate stomach muscles.  Getting food through a feeding tube. This may be done in severe cases.  A gastric neurostimulator. This is a device that is inserted into the body with surgery. It helps improve stomach emptying and control nausea and vomiting. HOME CARE INSTRUCTIONS  Follow your health care provider's instructions about exercise and diet.  Take medicines only as directed by your health care provider. SEEK  MEDICAL CARE IF:  Your symptoms do not improve with treatment.  You have new symptoms. SEEK IMMEDIATE MEDICAL CARE IF:  You have severe abdominal pain that does not  improve with treatment.  You have nausea that does not go away.  You cannot keep fluids down.   This information is not intended to replace advice given to you by your health care provider. Make sure you discuss any questions you have with your health care provider.   Document Released: 05/26/2005 Document Revised: 10/10/2014 Document Reviewed: 05/22/2014 Elsevier Interactive Patient Education [employer]2016 [employer] Inc.

## 2015-05-31 NOTE — ED Notes (Signed)
Pt verbalizes understanding of instructions. 

## 2015-05-31 NOTE — ED Notes (Signed)
Pt has urinal AT BEDSIDE AND URGED TO PROVIDE SPECIIMEN.

## 2015-05-31 NOTE — ED Notes (Signed)
Per pt he started having left sided abdominal pain and emesis since about 0200 today. Pt states that he took reglan for pain and it did not help. Pt states that he has vomited about 5 times and has seen "just a few drops of blood" in his emesis. Pt denies diarrhea. Pt is on keflex for his gastroparesis. He was just discharged from the hospital for the same on Tuesday of this week.

## 2015-06-08 ENCOUNTER — Encounter (HOSPITAL_BASED_OUTPATIENT_CLINIC_OR_DEPARTMENT_OTHER): Payer: PPO | Attending: Internal Medicine

## 2015-06-21 ENCOUNTER — Other Ambulatory Visit: Payer: Self-pay

## 2015-06-22 ENCOUNTER — Encounter: Payer: PPO | Admitting: Podiatry

## 2015-06-29 ENCOUNTER — Encounter: Payer: PPO | Admitting: Podiatry

## 2015-07-09 ENCOUNTER — Ambulatory Visit (INDEPENDENT_AMBULATORY_CARE_PROVIDER_SITE_OTHER): Payer: PPO

## 2015-07-09 ENCOUNTER — Encounter: Payer: Self-pay | Admitting: Podiatry

## 2015-07-09 ENCOUNTER — Ambulatory Visit (INDEPENDENT_AMBULATORY_CARE_PROVIDER_SITE_OTHER): Payer: PPO | Admitting: Podiatry

## 2015-07-09 VITALS — BP 136/93 | HR 66 | Resp 16 | Ht 73.0 in | Wt 235.0 lb

## 2015-07-09 DIAGNOSIS — Q828 Other specified congenital malformations of skin: Secondary | ICD-10-CM

## 2015-07-09 DIAGNOSIS — M21619 Bunion of unspecified foot: Secondary | ICD-10-CM

## 2015-07-09 DIAGNOSIS — E1151 Type 2 diabetes mellitus with diabetic peripheral angiopathy without gangrene: Secondary | ICD-10-CM

## 2015-07-09 DIAGNOSIS — M79676 Pain in unspecified toe(s): Secondary | ICD-10-CM | POA: Diagnosis not present

## 2015-07-09 DIAGNOSIS — B351 Tinea unguium: Secondary | ICD-10-CM | POA: Diagnosis not present

## 2015-07-09 DIAGNOSIS — M2010 Hallux valgus (acquired), unspecified foot: Secondary | ICD-10-CM

## 2015-07-09 DIAGNOSIS — M79604 Pain in right leg: Secondary | ICD-10-CM

## 2015-07-09 DIAGNOSIS — M79605 Pain in left leg: Secondary | ICD-10-CM

## 2015-07-09 NOTE — Progress Notes (Signed)
   Subjective:    Patient ID: [name], male    DOB: 11/04/1975, 40 y.o.   MRN: [id number]  HPI Patient presents with bilateral foot pain; bunions; Right foot-hurts where incision site it; Left foot-plantar forefoot. Pt stated, "had right foot amputated 5-6 months ago with Dr. Lajoyce Corners, DPM".  Pt is Diabetic Type 2; Sugar=did not check today; A1C=7.1  Review of Systems  All other systems reviewed and are negative.      Objective:   Physical Exam        Assessment & Plan:

## 2015-07-10 NOTE — Progress Notes (Signed)
Subjective:     Patient ID: [name], male   DOB: [birth year], 40 y.o.   MRN: [id number]  HPI long-term diabetic who is in very poor control for years has a amputation of the right forefoot and 3 digits on the left foot. He has severe thickening of his fourth and fifth nails left and he has severe keratotic lesion underneath the distal stump right foot with no current active ulceration   Review of Systems  All other systems reviewed and are negative.      Objective:   Physical Exam  Constitutional: He is oriented to person, place, and time.  Musculoskeletal: Normal range of motion.  Neurological: He is oriented to person, place, and time.  Skin: Skin is warm and dry.  Nursing note and vitals reviewed.  neurovascular status was diminished both sharp Dole vibratory and distal pulses. They did appear to be adequate and there is muscle strength loss but patient does have pliable tissue. I noted to be large keratotic lesion underneath the stump of the right transmetatarsal amputation and severe elongation of nailbeds 45 left with pain due to compression and severe mycotic infection     Assessment:     Long-term diabetic with severe risk factors with lesion formation right and nail disease left    Plan:     H&P and all conditions and diabetes reviewed with patient. Today I debrided lesion sharply on the right foot and debrided nailbeds 45 left with no iatrogenic bleeding and I will get him approved for diabetic shoes. Patient will be seen back for those and will always see [place] the minute he were to develop any break in tissue from this point forward reviewed x-rays with her

## 2015-07-11 DIAGNOSIS — I10 Essential (primary) hypertension: Secondary | ICD-10-CM | POA: Diagnosis not present

## 2015-07-11 DIAGNOSIS — H52223 Regular astigmatism, bilateral: Secondary | ICD-10-CM | POA: Diagnosis not present

## 2015-07-11 DIAGNOSIS — H5203 Hypermetropia, bilateral: Secondary | ICD-10-CM | POA: Diagnosis not present

## 2015-07-11 DIAGNOSIS — E119 Type 2 diabetes mellitus without complications: Secondary | ICD-10-CM | POA: Diagnosis not present

## 2015-07-11 DIAGNOSIS — H53021 Refractive amblyopia, right eye: Secondary | ICD-10-CM | POA: Diagnosis not present

## 2015-09-07 NOTE — Progress Notes (Signed)
This encounter was created in error - please disregard.

## 2015-12-25 ENCOUNTER — Encounter (HOSPITAL_COMMUNITY): Payer: Self-pay | Admitting: Emergency Medicine

## 2015-12-25 ENCOUNTER — Emergency Department (HOSPITAL_COMMUNITY): Payer: PPO

## 2015-12-25 ENCOUNTER — Emergency Department (HOSPITAL_COMMUNITY)
Admission: EM | Admit: 2015-12-25 | Discharge: 2015-12-25 | Disposition: A | Discharge status: Home / Self Care | Payer: PPO | Attending: Emergency Medicine | Admitting: Emergency Medicine

## 2015-12-25 DIAGNOSIS — Z794 Long term (current) use of insulin: Secondary | ICD-10-CM | POA: Insufficient documentation

## 2015-12-25 DIAGNOSIS — N183 Chronic kidney disease, stage 3 (moderate): Secondary | ICD-10-CM | POA: Insufficient documentation

## 2015-12-25 DIAGNOSIS — Z89422 Acquired absence of other left toe(s): Secondary | ICD-10-CM | POA: Diagnosis not present

## 2015-12-25 DIAGNOSIS — E119 Type 2 diabetes mellitus without complications: Secondary | ICD-10-CM | POA: Insufficient documentation

## 2015-12-25 DIAGNOSIS — Z89431 Acquired absence of right foot: Secondary | ICD-10-CM | POA: Diagnosis not present

## 2015-12-25 DIAGNOSIS — I129 Hypertensive chronic kidney disease with stage 1 through stage 4 chronic kidney disease, or unspecified chronic kidney disease: Secondary | ICD-10-CM | POA: Insufficient documentation

## 2015-12-25 DIAGNOSIS — M7918 Myalgia, other site: Secondary | ICD-10-CM

## 2015-12-25 DIAGNOSIS — M791 Myalgia: Secondary | ICD-10-CM | POA: Diagnosis not present

## 2015-12-25 DIAGNOSIS — F17210 Nicotine dependence, cigarettes, uncomplicated: Secondary | ICD-10-CM | POA: Insufficient documentation

## 2015-12-25 DIAGNOSIS — M79671 Pain in right foot: Secondary | ICD-10-CM | POA: Insufficient documentation

## 2015-12-25 DIAGNOSIS — M79672 Pain in left foot: Secondary | ICD-10-CM | POA: Insufficient documentation

## 2015-12-25 LAB — CBC
HCT: 35.4 % — ABNORMAL LOW (ref 39.0–52.0)
Hemoglobin: 11.7 g/dL — ABNORMAL LOW (ref 13.0–17.0)
MCH: 30.9 pg (ref 26.0–34.0)
MCHC: 33.1 g/dL (ref 30.0–36.0)
MCV: 93.4 fL (ref 78.0–100.0)
PLATELETS: 232 10*3/uL (ref 150–400)
RBC: 3.79 MIL/uL — AB (ref 4.22–5.81)
RDW: 13.6 % (ref 11.5–15.5)
WBC: 9.7 10*3/uL (ref 4.0–10.5)

## 2015-12-25 LAB — BASIC METABOLIC PANEL
ANION GAP: 8 (ref 5–15)
BUN: 24 mg/dL — ABNORMAL HIGH (ref 6–20)
CALCIUM: 9.4 mg/dL (ref 8.9–10.3)
CO2: 28 mmol/L (ref 22–32)
Chloride: 103 mmol/L (ref 101–111)
Creatinine, Ser: 1.73 mg/dL — ABNORMAL HIGH (ref 0.61–1.24)
GFR, EST AFRICAN AMERICAN: 56 mL/min — AB (ref >60)
GFR, EST NON AFRICAN AMERICAN: 48 mL/min — AB (ref >60)
GLUCOSE: 98 mg/dL (ref 65–99)
POTASSIUM: 4.4 mmol/L (ref 3.5–5.1)
SODIUM: 139 mmol/L (ref 135–145)

## 2015-12-25 MED ORDER — OXYCODONE-ACETAMINOPHEN 5-325 MG PO TABS: 1.0000 | ORAL_TABLET | Freq: Four times a day (QID) | ORAL | Status: DC | PRN

## 2015-12-25 MED ORDER — MORPHINE SULFATE (PF) 4 MG/ML IV SOLN
4.0000 mg | Freq: Once | INTRAVENOUS | Status: AC
  Administered 2015-12-25: 4 mg via INTRAVENOUS
  Filled 2015-12-25: qty 1

## 2015-12-25 NOTE — Discharge Instructions (Signed)
Please read and follow all provided instructions.  Your diagnoses today include:  1. Foot pain, bilateral   2. Musculoskeletal pain    Tests performed today include:  Vital signs. See below for your results today.   Medications prescribed:   Take as prescribed   You have been prescribed a narcotic medication on an "as needed" basis. Take only as prescribed. Do not drive, operate any machinery or make any important decisions while taking this medication as it is sedating. It may cause constipation take over the counter stool softeners or add fiber to your diet to treat this (Metamucil, Psyllium Fiber, Colace, Miralax) Further refills will need to be obtained from your primary care doctor and will not be prescribed through the Emergency Department. You will test positive on most drug tests while taking this medciation.   Home care instructions:  Follow any educational materials contained in this packet.  Follow-up instructions: Please follow-up with your Orthopedics for further evaluation of symptoms and treatment   Return instructions:   Please return to the Emergency Department if you do not get better, if you get worse, or new symptoms OR  - Fever (temperature greater than 101.5F)  - Bleeding that does not stop with holding pressure to the area    -Severe pain (please note that you may be more sore the day after your accident)  - Chest Pain  - Difficulty breathing  - Severe nausea or vomiting  - Inability to tolerate food and liquids  - Passing out  - Skin becoming red around your wounds  - Change in mental status (confusion or lethargy)  - New numbness or weakness     Please return if you have any other emergent concerns.  Additional Information:  Your vital signs today were: BP 140/84 mmHg   Pulse 65   Temp(Src) 98.3 F (36.8 C) (Oral)   Resp 18   SpO2 100% If your blood pressure (BP) was elevated above 135/85 this visit, please have this repeated by your doctor within  one month. ---------------

## 2015-12-25 NOTE — ED Notes (Signed)
Pt with bilateral swelling and wounds to feet; pt is diabetic with hx of toe amputations

## 2015-12-25 NOTE — ED Provider Notes (Signed)
CSN: [id number]     Arrival date & time 12/25/15  1826 History   First MD Initiated Contact with Patient 12/25/15 2058     Chief Complaint  Patient presents with  . Wound Check   (Consider location/radiation/quality/duration/timing/severity/associated sxs/prior Treatment) HPI 40 y.o. male with a hx of DM2, Osteomyelitis, presents to the Emergency Department today complaining of BLE foot swelling and pain. Of note, pt with an amputation of the right forefoot and 3 digits on the left foot. He has severe thickening of his fourth and fifth nails left and he has severe keratotic lesion underneath. Pt noted swelling over the past week in his right foot with associated pain. No erythema. Also noted pain and swelling in left foot over 2 months. Pain 10/10. Throbbing sensation. Able to ambulate, but with much difficulty. Noted N/V over 4 hours x 2 days ago. None today. Subjective fever at home. No CP/SOB/ABD pain. No headaches. No other symptoms noted.     Past Medical History  Diagnosis Date  . Hypertension   . Vascular disease     poor circulation to left foot  . Acute osteomyelitis, ankle and foot 07/30/2010    Qualifier: Diagnosis of  By: Tommy Medal MD, Roderic Scarce    . Anemia   . Hiatal hernia   . DDD (degenerative disc disease), lumbar   . Pancreatitis   . Gastroparesis   . Collagen vascular disease ([place])   . GERD (gastroesophageal reflux disease)   . Hyperlipidemia   . Peripheral vascular disease ([place])   . Type II diabetes mellitus ([place]) dx'd ~ 1996  . Renal insufficiency   . Chronic kidney disease (CKD), stage III (moderate)    Past Surgical History  Procedure Laterality Date  . Toe amputation  2012    left foot; great toe and second toe  . Amputation  04/25/2011    Procedure: AMPUTATION DIGIT;  Surgeon: Newt Minion, MD;  Location: [facility];  Service: Orthopedics;  Laterality: Left;  Left foot 3rd toe amputation MTP joint, Gastroc Recession  Achilles Lengthening   . Amputation Bilateral  03/25/2013    Procedure: AMPUTATION RAY;  Surgeon: Newt Minion, MD;  Location: [place] [facility];  Service: Orthopedics;  Laterality: Bilateral;  Left Great Toe Amputation at  MTP Joint, Right 1st and 2nd Ray Amputation   . Amputation Right 10/03/2014    Procedure: AMPUTATION MIDFOOT;  Surgeon: Newt Minion, MD;  Location: [place];  Service: Orthopedics;  Laterality: Right;  . Laparoscopic cholecystectomy     Family History  Problem Relation Age of Onset  . Heart attack Father 9  . Hypertension Sister    Social History  Substance Use Topics  . Smoking status: Current Every Day Smoker -- 0.10 packs/day for 4 years    Types: Cigarettes  . Smokeless tobacco: Never Used  . Alcohol Use: No    Review of Systems ROS reviewed and all are negative for acute change except as noted in the HPI.  Allergies  Review of patient's allergies indicates no known allergies.  Home Medications   Prior to Admission medications   Medication Sig Start Date End Date Taking? Authorizing Provider  blood glucose meter kit and supplies KIT Dispense based on patient and insurance preference. Use to check blood sugar two times daily as directed. (FOR ICD-9 250.00, 250.01). 09/30/14   Barton Dubois, MD  gabapentin (NEURONTIN) 100 MG capsule Take 2 capsules (200 mg total) by mouth 3 (three) times daily. 05/30/14   Silver Huguenin  Elgergawy, MD  insulin aspart (NOVOLOG) 100 UNIT/ML injection Inject 3 Units into the skin 3 (three) times daily with meals. 10/05/14   Belkys A Regalado, MD  insulin glargine (LANTUS) 100 UNIT/ML injection Inject 0.15 mLs (15 Units total) into the skin at bedtime. 10/05/14   Belkys A Regalado, MD  Insulin Pen Needle 31G X 5 MM MISC Use one needle to inject insulin with flexpen as indicated twice a day 09/30/14   Barton Dubois, MD  lisinopril (PRINIVIL,ZESTRIL) 20 MG tablet Take 20 mg by mouth daily.    Historical Provider, MD  metoCLOPramide (REGLAN) 10 MG tablet Take 1 tablet (10 mg total) by mouth every 6  (six) hours. 05/31/15   Tatyana Kirichenko, PA-C   BP 151/95 mmHg  Pulse 74  Temp(Src) 98.3 F (36.8 C) (Oral)  Resp 18  SpO2 100%   Physical Exam  Constitutional: He is oriented to person, place, and time. He appears well-developed and well-nourished.  HENT:  Head: Normocephalic and atraumatic.  Eyes: EOM are normal. Pupils are equal, round, and reactive to light.  Neck: Normal range of motion. Neck supple. No tracheal deviation present.  Cardiovascular: Normal rate, regular rhythm, normal heart sounds and intact distal pulses.   No murmur heard. Pulmonary/Chest: Effort normal and breath sounds normal. No respiratory distress. He has no wheezes. He has no rales. He exhibits no tenderness.  Abdominal: Soft. Bowel sounds are normal.  Musculoskeletal: Normal range of motion.  Amputation of the right forefoot and 3 digits on the left foot. BLE swelling noted on feet. No calf tenderness. No erythema. TTP along MTPs bilaterally. Motor intact. No obvious open wounds or purulence noted.   Neurological: He is alert and oriented to person, place, and time.  Skin: Skin is warm and dry.  Psychiatric: He has a normal mood and affect. His behavior is normal. Thought content normal.  Nursing note and vitals reviewed.  ED Course  Procedures (including critical care time) Labs Review Labs Reviewed  CBC - Abnormal; Notable for the following:    RBC 3.79 (*)    Hemoglobin 11.7 (*)    HCT 35.4 (*)    All other components within normal limits  BASIC METABOLIC PANEL - Abnormal; Notable for the following:    BUN 24 (*)    Creatinine, Ser 1.73 (*)    GFR calc non Af Amer 48 (*)    GFR calc Af Amer 56 (*)    All other components within normal limits  URINALYSIS, ROUTINE W REFLEX MICROSCOPIC (NOT AT [facility])  CBG MONITORING, ED   Imaging Review Dg Foot Complete Left  12/25/2015  CLINICAL DATA:  Pain and swelling for several days. EXAM: LEFT FOOT - COMPLETE 3+ VIEW COMPARISON:  July 09, 2015  FINDINGS: Amputation of the first, second, and third toes. No other bony changes. No evidence of osteomyelitis. IMPRESSION: No evidence of osteomyelitis. Electronically Signed   By: Dorise Bullion III M.D   On: 12/25/2015 22:01   Dg Foot Complete Right  12/25/2015  CLINICAL DATA:  Bilateral foot pain and swelling EXAM: RIGHT FOOT COMPLETE - 3+ VIEW COMPARISON:  July 09, 2015 FINDINGS: Amputations of the first through fifth on toes with no interval bony change or evidence of osteomyelitis. IMPRESSION: No evidence of osteomyelitis. Electronically Signed   By: Dorise Bullion III M.D   On: 12/25/2015 22:05   I have personally reviewed and evaluated these images and lab results as part of my medical decision-making.   EKG Interpretation None  MDM  I have reviewed and evaluated the relevant laboratory values I have reviewed and evaluated the relevant imaging studies.  I have reviewed the relevant previous healthcare records. I obtained HPI from historian. Patient discussed with supervising physician  ED Course:  Assessment: Pt is a 19yM with hx DM2 who presents with BLE swelling/pain on right and left foot for 1 week and 2 months respectively. Hx Osteomyelitis. Has had right forefoot amputated and 3 digits of left foot amputated. On exam, pt in NAD. Nontoxic/nonseptic appearing. VSS. Afebrile. Lungs CTA. Heart RRR. No obvious infectious process in BLE. Motor intact. No erythema. No purulence. CBC/BMP unremarkable. No hyperglycemia. Imaging unremarkable for Osteomyelitis. Given analgesia in ED. Pt seen and evaluated by supervising physician. Unlikely to be Osteomyelitis. No infectious process. No leukocytosis. Likely swelling from ambulation complicated by amputations. Plan is to DC home with follow up to Ortho (Dr. Sharol Given) who had done previous amputations. At time of discharge, Patient is in no acute distress. Vital Signs are stable. Patient is able to ambulate. Patient able to tolerate PO.     Disposition/Plan:  DC Home Additional Verbal discharge instructions given and discussed with patient.  Pt Instructed to f/u with Ortho in the next week for evaluation and treatment of symptoms. Return precautions given Pt acknowledges and agrees with plan  Supervising Physician Dorie Rank, MD   Final diagnoses:  Foot pain, bilateral  Musculoskeletal pain     Shary Decamp, PA-C 12/25/15 2256  Dorie Rank, MD 12/25/15 [phone]

## 2015-12-27 ENCOUNTER — Emergency Department (HOSPITAL_COMMUNITY): Admit: 2015-12-27 | Payer: PPO

## 2015-12-27 ENCOUNTER — Encounter (HOSPITAL_COMMUNITY): Payer: Self-pay

## 2015-12-27 ENCOUNTER — Emergency Department (HOSPITAL_COMMUNITY): Payer: PPO

## 2015-12-27 ENCOUNTER — Emergency Department (HOSPITAL_COMMUNITY)
Admission: EM | Admit: 2015-12-27 | Discharge: 2015-12-27 | Disposition: A | Discharge status: Home / Self Care | Payer: PPO | Attending: Emergency Medicine | Admitting: Emergency Medicine

## 2015-12-27 DIAGNOSIS — E785 Hyperlipidemia, unspecified: Secondary | ICD-10-CM | POA: Insufficient documentation

## 2015-12-27 DIAGNOSIS — F1290 Cannabis use, unspecified, uncomplicated: Secondary | ICD-10-CM | POA: Diagnosis not present

## 2015-12-27 DIAGNOSIS — Z79899 Other long term (current) drug therapy: Secondary | ICD-10-CM | POA: Insufficient documentation

## 2015-12-27 DIAGNOSIS — S91301A Unspecified open wound, right foot, initial encounter: Secondary | ICD-10-CM | POA: Diagnosis not present

## 2015-12-27 DIAGNOSIS — Z794 Long term (current) use of insulin: Secondary | ICD-10-CM | POA: Diagnosis not present

## 2015-12-27 DIAGNOSIS — E1122 Type 2 diabetes mellitus with diabetic chronic kidney disease: Secondary | ICD-10-CM | POA: Diagnosis not present

## 2015-12-27 DIAGNOSIS — Z8679 Personal history of other diseases of the circulatory system: Secondary | ICD-10-CM | POA: Insufficient documentation

## 2015-12-27 DIAGNOSIS — N183 Chronic kidney disease, stage 3 (moderate): Secondary | ICD-10-CM | POA: Insufficient documentation

## 2015-12-27 DIAGNOSIS — F17210 Nicotine dependence, cigarettes, uncomplicated: Secondary | ICD-10-CM | POA: Diagnosis not present

## 2015-12-27 DIAGNOSIS — L089 Local infection of the skin and subcutaneous tissue, unspecified: Secondary | ICD-10-CM

## 2015-12-27 DIAGNOSIS — T148XXA Other injury of unspecified body region, initial encounter: Secondary | ICD-10-CM

## 2015-12-27 DIAGNOSIS — I129 Hypertensive chronic kidney disease with stage 1 through stage 4 chronic kidney disease, or unspecified chronic kidney disease: Secondary | ICD-10-CM | POA: Diagnosis not present

## 2015-12-27 DIAGNOSIS — T814XXA Infection following a procedure, initial encounter: Secondary | ICD-10-CM | POA: Diagnosis not present

## 2015-12-27 LAB — BASIC METABOLIC PANEL WITH GFR
Anion gap: 7 (ref 5–15)
BUN: 25 mg/dL — ABNORMAL HIGH (ref 6–20)
CO2: 27 mmol/L (ref 22–32)
Calcium: 9 mg/dL (ref 8.9–10.3)
Chloride: 101 mmol/L (ref 101–111)
Creatinine, Ser: 1.44 mg/dL — ABNORMAL HIGH (ref 0.61–1.24)
GFR calc Af Amer: >60 mL/min
GFR calc non Af Amer: 60 mL/min — ABNORMAL LOW
Glucose, Bld: 152 mg/dL — ABNORMAL HIGH (ref 65–99)
Potassium: 4.3 mmol/L (ref 3.5–5.1)
Sodium: 135 mmol/L (ref 135–145)

## 2015-12-27 LAB — CBC WITH DIFFERENTIAL/PLATELET
Basophils Absolute: 0 K/uL (ref 0.0–0.1)
Basophils Relative: 0 %
Eosinophils Absolute: 0.2 K/uL (ref 0.0–0.7)
Eosinophils Relative: 2 %
HCT: 36.9 % — ABNORMAL LOW (ref 39.0–52.0)
Hemoglobin: 12.3 g/dL — ABNORMAL LOW (ref 13.0–17.0)
Lymphocytes Relative: 24 %
Lymphs Abs: 1.8 K/uL (ref 0.7–4.0)
MCH: 30.8 pg (ref 26.0–34.0)
MCHC: 33.3 g/dL (ref 30.0–36.0)
MCV: 92.5 fL (ref 78.0–100.0)
Monocytes Absolute: 0.3 K/uL (ref 0.1–1.0)
Monocytes Relative: 4 %
Neutro Abs: 5.3 K/uL (ref 1.7–7.7)
Neutrophils Relative %: 70 %
Platelets: 254 K/uL (ref 150–400)
RBC: 3.99 MIL/uL — ABNORMAL LOW (ref 4.22–5.81)
RDW: 13.3 % (ref 11.5–15.5)
WBC: 7.6 K/uL (ref 4.0–10.5)

## 2015-12-27 MED ORDER — OXYCODONE-ACETAMINOPHEN 5-325 MG PO TABS
1.0000 | ORAL_TABLET | Freq: Once | ORAL | Status: AC
  Administered 2015-12-27: 1 via ORAL
  Filled 2015-12-27: qty 1

## 2015-12-27 MED ORDER — DOXYCYCLINE HYCLATE 100 MG PO CAPS: 100.0000 mg | ORAL_CAPSULE | Freq: Two times a day (BID) | ORAL | Status: DC

## 2015-12-27 NOTE — Discharge Instructions (Signed)
Follow up with Dr. Lajoyce Cornersuda next week for re-evaluation. Please contact their office to schedule appointment. Take antibiotics as prescribed. Return to the ED if you experience severe worsening of your symptoms, fever, increased redness or swelling around your wound, vomiting, significant increase in pain.

## 2015-12-27 NOTE — ED Notes (Addendum)
Patient transported to X-ray 

## 2015-12-27 NOTE — ED Notes (Signed)
Pt presents with c/o right foot injury. Pt reports he has a wound on his right foot, hx of diabetes. Pt reports the area is draining and has an odor to it, is swollen and warm to the touch. Pt is ambulatory to triage. [facility].

## 2015-12-27 NOTE — ED Provider Notes (Signed)
CSN: [id number]     Arrival date & time 12/27/15  1017 History   First MD Initiated Contact with Patient 12/27/15 1229     Chief Complaint  Patient presents with  . Wound on foot      (Consider location/radiation/quality/duration/timing/severity/associated sxs/prior Treatment) HPI  [name] is a 40 year old male with past medical history of HTN, CK D, DM, osteomyelitis who presents the ED today complaining of pain, swelling and drainage of his right foot. Patient states that he had his digits amputated 8 months ago due to infected diabetic foot ulcer by Dr.Duda. Over the last 3 days he has noted increased swelling of his foot and right calf with associated tenderness. Patient is also noted clear drainage from an incision wound that has a foul odor. He denies any fevers or chills. Patient is concerned that he has another infection and wants to be checked out. He was seen in the ED 2 days ago with similar symptoms and was discharged home with instructions to follow-up with his orthopedic surgeon but he states he was unable to do this.  Past Medical History  Diagnosis Date  . Hypertension   . Vascular disease     poor circulation to left foot  . Acute osteomyelitis, ankle and foot 07/30/2010    Qualifier: Diagnosis of  By: Tommy Medal MD, Roderic Scarce    . Anemia   . Hiatal hernia   . DDD (degenerative disc disease), lumbar   . Pancreatitis   . Gastroparesis   . Collagen vascular disease ([place])   . GERD (gastroesophageal reflux disease)   . Hyperlipidemia   . Peripheral vascular disease ([place])   . Type II diabetes mellitus ([place]) dx'd ~ 1996  . Renal insufficiency   . Chronic kidney disease (CKD), stage III (moderate)    Past Surgical History  Procedure Laterality Date  . Toe amputation  2012    left foot; great toe and second toe  . Amputation  04/25/2011    Procedure: AMPUTATION DIGIT;  Surgeon: Newt Minion, MD;  Location: [facility];  Service: Orthopedics;  Laterality: Left;  Left foot  3rd toe amputation MTP joint, Gastroc Recession  Achilles Lengthening   . Amputation Bilateral 03/25/2013    Procedure: AMPUTATION RAY;  Surgeon: Newt Minion, MD;  Location: [facility];  Service: Orthopedics;  Laterality: Bilateral;  Left Great Toe Amputation at  MTP Joint, Right 1st and 2nd Ray Amputation   . Amputation Right 10/03/2014    Procedure: AMPUTATION MIDFOOT;  Surgeon: Newt Minion, MD;  Location: [facility];  Service: Orthopedics;  Laterality: Right;  . Laparoscopic cholecystectomy     Family History  Problem Relation Age of Onset  . Heart attack Father 66  . Hypertension Sister    Social History  Substance Use Topics  . Smoking status: Current Every Day Smoker -- 0.10 packs/day for 4 years    Types: Cigarettes  . Smokeless tobacco: Never Used  . Alcohol Use: No    Review of Systems  All other systems reviewed and are negative.     Allergies  Review of patient's allergies indicates no known allergies.  Home Medications   Prior to Admission medications   Medication Sig Start Date End Date Taking? Authorizing Provider  blood glucose meter kit and supplies KIT Dispense based on patient and insurance preference. Use to check blood sugar two times daily as directed. (FOR ICD-9 250.00, 250.01). 09/30/14   Barton Dubois, MD  gabapentin (NEURONTIN) 100 MG capsule Take  2 capsules (200 mg total) by mouth 3 (three) times daily. 05/30/14   Leana Roe Elgergawy, MD  ibuprofen (ADVIL,MOTRIN) 200 MG tablet Take 400-600 mg by mouth every 6 (six) hours as needed for moderate pain.    Historical Provider, MD  insulin aspart (NOVOLOG) 100 UNIT/ML injection Inject 3 Units into the skin 3 (three) times daily with meals. 10/05/14   Belkys A Regalado, MD  insulin glargine (LANTUS) 100 UNIT/ML injection Inject 0.15 mLs (15 Units total) into the skin at bedtime. 10/05/14   Belkys A Regalado, MD  Insulin Pen Needle 31G X 5 MM MISC Use one needle to inject insulin with flexpen as indicated twice a day  09/30/14   Vassie Loll, MD  lisinopril (PRINIVIL,ZESTRIL) 20 MG tablet Take 20 mg by mouth daily.    Historical Provider, MD  metoCLOPramide (REGLAN) 10 MG tablet Take 1 tablet (10 mg total) by mouth every 6 (six) hours. 05/31/15   Tatyana Kirichenko, PA-C  oxyCODONE-acetaminophen (PERCOCET/ROXICET) 5-325 MG tablet Take 1 tablet by mouth every 6 (six) hours as needed for severe pain. 12/25/15   Audry Pili, PA-C   BP 135/89 mmHg  Pulse 68  Temp(Src) 97.7 F (36.5 C) (Oral)  Resp 18  SpO2 100% Physical Exam  Constitutional: He is oriented to person, place, and time. He appears well-developed and well-nourished. No distress.  HENT:  Head: Normocephalic and atraumatic.  Eyes: Conjunctivae are normal. Right eye exhibits no discharge. Left eye exhibits no discharge. No scleral icterus.  Cardiovascular: Normal rate.   Pulmonary/Chest: Effort normal.  Musculoskeletal:  Amputation of R forefoot and 3 digits on L foot. Bilateral pitting edema of dorsum of feet and ankles, R>L. Mild TTP over previous incision site of R foot with associated minimal serosanguinous fluid drainage. No purulent drainage. No fluctuance, erythema or induration. No streaking.   Neurological: He is alert and oriented to person, place, and time. Coordination normal.  Skin: Skin is warm and dry. No rash noted. He is not diaphoretic. No erythema. No pallor.  Psychiatric: He has a normal mood and affect. His behavior is normal.  Nursing note and vitals reviewed.   ED Course  Procedures (including critical care time) Labs Review Labs Reviewed  CBC WITH DIFFERENTIAL/PLATELET  BASIC METABOLIC PANEL    Imaging Review Dg Foot Complete Left  12/25/2015  CLINICAL DATA:  Pain and swelling for several days. EXAM: LEFT FOOT - COMPLETE 3+ VIEW COMPARISON:  July 09, 2015 FINDINGS: Amputation of the first, second, and third toes. No other bony changes. No evidence of osteomyelitis. IMPRESSION: No evidence of osteomyelitis.  Electronically Signed   By: Gerome Sam III M.D   On: 12/25/2015 22:01   Dg Foot Complete Right  12/27/2015  CLINICAL DATA:  Right foot wound.  Diabetes mellitus. EXAM: RIGHT FOOT COMPLETE - 3+ VIEW COMPARISON:  December 25, 2015 FINDINGS: Frontal, oblique, and lateral views were obtained. The patient has had prior amputation of the first digit at the level of the tarsal -metatarsal joint. There it band amputations of the second, third, fourth, and fifth digits at the levels of the respective distal metatarsals. In the distal stump region, there is soft tissue edema with air overlying the second metatarsal distally, concerning for developing abscess in this area. There is moderate edema throughout the distal foot region but no other foci of air. There is bony overgrowth in the distal metatarsal regions with bony fusion between the distal fourth and fifth metatarsals. Partial fusion is seen between the second  and third distal metatarsals. No acute fracture or dislocation is seen. No erosive change or bony destruction. There is spurring in the dorsal midfoot region. There is a small posterior calcaneal spur. IMPRESSION: Areas of prior amputation. Suspect developing abscess in the distal stump at the level of the second metatarsal distally. There is generalized soft tissue edema in the distal foot region. No osteomyelitis seen. No acute fracture or dislocation. Areas of ankylosis in the distal metatarsal regions, likely reactive from the amputations. Spurring in the dorsal midfoot noted. Small posterior calcaneal spur noted. Electronically Signed   By: Lowella Grip III [name]   On: 12/27/2015 13:20   Dg Foot Complete Right  12/25/2015  CLINICAL DATA:  Bilateral foot pain and swelling EXAM: RIGHT FOOT COMPLETE - 3+ VIEW COMPARISON:  July 09, 2015 FINDINGS: Amputations of the first through fifth on toes with no interval bony change or evidence of osteomyelitis. IMPRESSION: No evidence of osteomyelitis.  Electronically Signed   By: Dorise Bullion III M.D   On: 12/25/2015 22:05   I have personally reviewed and evaluated these images and lab results as part of my medical decision-making.   EKG Interpretation None      MDM   Final diagnoses:  Wound infection ([place])   40 year old male with past medical history of diabetes, osteomyelitis status post amputation of right forefoot and 3 digits of left foot who presents to the ED with swelling and tenderness of R forefoot over old incision site. There is also minimal serosanguineous drainage from the site. Patient is concerned for additional infection. He was seen in the ED a few days ago with similar symptoms. Patient appears well in ED, nontoxic and nonseptic appearing. Patient is afebrile. Right forefoot does appear to be swollen and is tender. No obvious fluctuance or erythema noted on exam. No leukocytosis or metabolic derangement and lab work. Repeat x-ray obtained which reveals possible developing abscess in the distal stump the level of the second metatarsal. No obvious osteomyelitis seen. 2:29 PM Spoke with Dr. Sharol Given who recommend outpatient oral doxycycline and follow up in his office next week given that pt is non-septic and neurovascularly intact.  Discussed history and plan with patient who is agreeable. He will contact his orthopedic surgeon's office for follow-up next week. Return precautions outlined in patient discharge instructions.  Patient was discussed with and seen by Dr. Regenia Skeeter who agrees with the treatment plan.        Dondra Spry [place], PA-C 12/27/15 1958  Sherwood Gambler, MD 12/28/15 [phone]

## 2015-12-31 DIAGNOSIS — L97411 Non-pressure chronic ulcer of right heel and midfoot limited to breakdown of skin: Secondary | ICD-10-CM | POA: Diagnosis not present

## 2016-02-04 DIAGNOSIS — B351 Tinea unguium: Secondary | ICD-10-CM | POA: Diagnosis not present

## 2016-02-04 DIAGNOSIS — E1142 Type 2 diabetes mellitus with diabetic polyneuropathy: Secondary | ICD-10-CM | POA: Diagnosis not present

## 2016-02-04 DIAGNOSIS — I83893 Varicose veins of bilateral lower extremities with other complications: Secondary | ICD-10-CM | POA: Diagnosis not present

## 2016-02-04 DIAGNOSIS — L97411 Non-pressure chronic ulcer of right heel and midfoot limited to breakdown of skin: Secondary | ICD-10-CM | POA: Diagnosis not present

## 2016-02-09 DIAGNOSIS — E1142 Type 2 diabetes mellitus with diabetic polyneuropathy: Secondary | ICD-10-CM | POA: Diagnosis not present

## 2016-02-09 DIAGNOSIS — B351 Tinea unguium: Secondary | ICD-10-CM | POA: Diagnosis not present

## 2016-02-09 DIAGNOSIS — L97411 Non-pressure chronic ulcer of right heel and midfoot limited to breakdown of skin: Secondary | ICD-10-CM | POA: Diagnosis not present

## 2016-02-09 DIAGNOSIS — I83893 Varicose veins of bilateral lower extremities with other complications: Secondary | ICD-10-CM | POA: Diagnosis not present

## 2016-02-27 ENCOUNTER — Inpatient Hospital Stay (HOSPITAL_COMMUNITY)
Admission: EM | Admit: 2016-02-27 | Discharge: 2016-02-28 | DRG: 639 | Disposition: A | Discharge status: Home / Self Care | Payer: PPO | Attending: Internal Medicine | Admitting: Internal Medicine

## 2016-02-27 ENCOUNTER — Encounter (HOSPITAL_COMMUNITY): Payer: Self-pay

## 2016-02-27 ENCOUNTER — Emergency Department (HOSPITAL_COMMUNITY): Payer: PPO

## 2016-02-27 ENCOUNTER — Observation Stay (HOSPITAL_COMMUNITY): Payer: PPO

## 2016-02-27 DIAGNOSIS — Z89431 Acquired absence of right foot: Secondary | ICD-10-CM | POA: Diagnosis not present

## 2016-02-27 DIAGNOSIS — L97419 Non-pressure chronic ulcer of right heel and midfoot with unspecified severity: Secondary | ICD-10-CM | POA: Diagnosis not present

## 2016-02-27 DIAGNOSIS — Z794 Long term (current) use of insulin: Secondary | ICD-10-CM

## 2016-02-27 DIAGNOSIS — F17200 Nicotine dependence, unspecified, uncomplicated: Secondary | ICD-10-CM | POA: Diagnosis present

## 2016-02-27 DIAGNOSIS — Z720 Tobacco use: Secondary | ICD-10-CM | POA: Diagnosis present

## 2016-02-27 DIAGNOSIS — L97511 Non-pressure chronic ulcer of other part of right foot limited to breakdown of skin: Secondary | ICD-10-CM | POA: Diagnosis not present

## 2016-02-27 DIAGNOSIS — K3184 Gastroparesis: Secondary | ICD-10-CM | POA: Diagnosis not present

## 2016-02-27 DIAGNOSIS — E1143 Type 2 diabetes mellitus with diabetic autonomic (poly)neuropathy: Secondary | ICD-10-CM | POA: Diagnosis present

## 2016-02-27 DIAGNOSIS — Z89412 Acquired absence of left great toe: Secondary | ICD-10-CM

## 2016-02-27 DIAGNOSIS — N183 Chronic kidney disease, stage 3 unspecified: Secondary | ICD-10-CM | POA: Diagnosis present

## 2016-02-27 DIAGNOSIS — L089 Local infection of the skin and subcutaneous tissue, unspecified: Secondary | ICD-10-CM | POA: Diagnosis not present

## 2016-02-27 DIAGNOSIS — E1165 Type 2 diabetes mellitus with hyperglycemia: Secondary | ICD-10-CM | POA: Diagnosis not present

## 2016-02-27 DIAGNOSIS — E1122 Type 2 diabetes mellitus with diabetic chronic kidney disease: Secondary | ICD-10-CM | POA: Diagnosis not present

## 2016-02-27 DIAGNOSIS — I10 Essential (primary) hypertension: Secondary | ICD-10-CM | POA: Diagnosis not present

## 2016-02-27 DIAGNOSIS — E1142 Type 2 diabetes mellitus with diabetic polyneuropathy: Secondary | ICD-10-CM | POA: Diagnosis not present

## 2016-02-27 DIAGNOSIS — Z89422 Acquired absence of other left toe(s): Secondary | ICD-10-CM | POA: Diagnosis not present

## 2016-02-27 DIAGNOSIS — E1151 Type 2 diabetes mellitus with diabetic peripheral angiopathy without gangrene: Secondary | ICD-10-CM | POA: Diagnosis not present

## 2016-02-27 DIAGNOSIS — E119 Type 2 diabetes mellitus without complications: Secondary | ICD-10-CM

## 2016-02-27 DIAGNOSIS — L97519 Non-pressure chronic ulcer of other part of right foot with unspecified severity: Secondary | ICD-10-CM | POA: Diagnosis not present

## 2016-02-27 DIAGNOSIS — I129 Hypertensive chronic kidney disease with stage 1 through stage 4 chronic kidney disease, or unspecified chronic kidney disease: Secondary | ICD-10-CM | POA: Diagnosis present

## 2016-02-27 DIAGNOSIS — K219 Gastro-esophageal reflux disease without esophagitis: Secondary | ICD-10-CM | POA: Diagnosis not present

## 2016-02-27 DIAGNOSIS — Z8249 Family history of ischemic heart disease and other diseases of the circulatory system: Secondary | ICD-10-CM

## 2016-02-27 DIAGNOSIS — Z79899 Other long term (current) drug therapy: Secondary | ICD-10-CM

## 2016-02-27 DIAGNOSIS — M79609 Pain in unspecified limb: Secondary | ICD-10-CM

## 2016-02-27 DIAGNOSIS — Z23 Encounter for immunization: Secondary | ICD-10-CM | POA: Diagnosis not present

## 2016-02-27 DIAGNOSIS — F17210 Nicotine dependence, cigarettes, uncomplicated: Secondary | ICD-10-CM | POA: Diagnosis not present

## 2016-02-27 DIAGNOSIS — Z9049 Acquired absence of other specified parts of digestive tract: Secondary | ICD-10-CM | POA: Diagnosis not present

## 2016-02-27 DIAGNOSIS — E11628 Type 2 diabetes mellitus with other skin complications: Principal | ICD-10-CM | POA: Diagnosis present

## 2016-02-27 DIAGNOSIS — E11621 Type 2 diabetes mellitus with foot ulcer: Secondary | ICD-10-CM | POA: Diagnosis present

## 2016-02-27 DIAGNOSIS — E1169 Type 2 diabetes mellitus with other specified complication: Secondary | ICD-10-CM

## 2016-02-27 DIAGNOSIS — R600 Localized edema: Secondary | ICD-10-CM | POA: Diagnosis not present

## 2016-02-27 DIAGNOSIS — IMO0002 Reserved for concepts with insufficient information to code with codable children: Secondary | ICD-10-CM | POA: Diagnosis present

## 2016-02-27 LAB — CBC WITH DIFFERENTIAL/PLATELET
BASOS ABS: 0 10*3/uL (ref 0.0–0.1)
BASOS ABS: 0 10*3/uL (ref 0.0–0.1)
BASOS PCT: 0 %
BASOS PCT: 0 %
EOS ABS: 0.2 10*3/uL (ref 0.0–0.7)
EOS PCT: 3 %
Eosinophils Absolute: 0.3 10*3/uL (ref 0.0–0.7)
Eosinophils Relative: 3 %
HCT: 35.8 % — ABNORMAL LOW (ref 39.0–52.0)
HEMATOCRIT: 34.4 % — AB (ref 39.0–52.0)
HEMOGLOBIN: 12.1 g/dL — AB (ref 13.0–17.0)
HEMOGLOBIN: 11.6 g/dL — AB (ref 13.0–17.0)
Lymphocytes Relative: 29 %
Lymphocytes Relative: 27 %
Lymphs Abs: 2.6 10*3/uL (ref 0.7–4.0)
Lymphs Abs: 2.2 10*3/uL (ref 0.7–4.0)
MCH: 30.9 pg (ref 26.0–34.0)
MCH: 31 pg (ref 26.0–34.0)
MCHC: 33.8 g/dL (ref 30.0–36.0)
MCHC: 33.7 g/dL (ref 30.0–36.0)
MCV: 91.3 fL (ref 78.0–100.0)
MCV: 92 fL (ref 78.0–100.0)
MONO ABS: 0.5 10*3/uL (ref 0.1–1.0)
Monocytes Absolute: 0.5 10*3/uL (ref 0.1–1.0)
Monocytes Relative: 5 %
Monocytes Relative: 6 %
NEUTROS ABS: 5.3 10*3/uL (ref 1.7–7.7)
NEUTROS PCT: 63 %
NEUTROS PCT: 64 %
Neutro Abs: 5.6 10*3/uL (ref 1.7–7.7)
PLATELETS: 200 10*3/uL (ref 150–400)
Platelets: 195 10*3/uL (ref 150–400)
RBC: 3.92 MIL/uL — AB (ref 4.22–5.81)
RBC: 3.74 MIL/uL — AB (ref 4.22–5.81)
RDW: 13.7 % (ref 11.5–15.5)
RDW: 13.8 % (ref 11.5–15.5)
WBC: 8.9 10*3/uL (ref 4.0–10.5)
WBC: 8.3 10*3/uL (ref 4.0–10.5)

## 2016-02-27 LAB — LACTIC ACID, PLASMA
LACTIC ACID, VENOUS: 2.2 mmol/L — AB (ref 0.5–1.9)
LACTIC ACID, VENOUS: 1.1 mmol/L (ref 0.5–1.9)

## 2016-02-27 LAB — COMPREHENSIVE METABOLIC PANEL
ALBUMIN: 3.4 g/dL — AB (ref 3.5–5.0)
ALT: 18 U/L (ref 17–63)
AST: 19 U/L (ref 15–41)
Alkaline Phosphatase: 66 U/L (ref 38–126)
Anion gap: 7 (ref 5–15)
BUN: 19 mg/dL (ref 6–20)
CHLORIDE: 103 mmol/L (ref 101–111)
CO2: 26 mmol/L (ref 22–32)
Calcium: 8.9 mg/dL (ref 8.9–10.3)
Creatinine, Ser: 1.79 mg/dL — ABNORMAL HIGH (ref 0.61–1.24)
GFR calc Af Amer: 53 mL/min — ABNORMAL LOW (ref >60)
GFR calc non Af Amer: 46 mL/min — ABNORMAL LOW (ref >60)
GLUCOSE: 272 mg/dL — AB (ref 65–99)
POTASSIUM: 4 mmol/L (ref 3.5–5.1)
SODIUM: 136 mmol/L (ref 135–145)
Total Bilirubin: 0.4 mg/dL (ref 0.3–1.2)
Total Protein: 7.4 g/dL (ref 6.5–8.1)

## 2016-02-27 LAB — RAPID URINE DRUG SCREEN, HOSP PERFORMED
Amphetamines: NOT DETECTED
Barbiturates: NOT DETECTED
Benzodiazepines: NOT DETECTED
Cocaine: POSITIVE — AB
Opiates: NOT DETECTED
Tetrahydrocannabinol: POSITIVE — AB

## 2016-02-27 LAB — I-STAT CHEM 8, ED
BUN: 21 mg/dL — ABNORMAL HIGH (ref 6–20)
CREATININE: 1.6 mg/dL — AB (ref 0.61–1.24)
Calcium, Ion: 1.18 mmol/L (ref 1.15–1.40)
Chloride: 103 mmol/L (ref 101–111)
GLUCOSE: 164 mg/dL — AB (ref 65–99)
HEMATOCRIT: 38 % — AB (ref 39.0–52.0)
HEMOGLOBIN: 12.9 g/dL — AB (ref 13.0–17.0)
POTASSIUM: 3.9 mmol/L (ref 3.5–5.1)
Sodium: 140 mmol/L (ref 135–145)
TCO2: 27 mmol/L (ref 0–100)

## 2016-02-27 LAB — PREALBUMIN: Prealbumin: 22.5 mg/dL (ref 18–38)

## 2016-02-27 LAB — GLUCOSE, CAPILLARY
GLUCOSE-CAPILLARY: 149 mg/dL — AB (ref 65–99)
GLUCOSE-CAPILLARY: 130 mg/dL — AB (ref 65–99)
Glucose-Capillary: 190 mg/dL — ABNORMAL HIGH (ref 65–99)
Glucose-Capillary: 106 mg/dL — ABNORMAL HIGH (ref 65–99)

## 2016-02-27 LAB — HIV ANTIBODY (ROUTINE TESTING W REFLEX): HIV Screen 4th Generation wRfx: NONREACTIVE

## 2016-02-27 LAB — PROTIME-INR
INR: 1.07
Prothrombin Time: 13.9 seconds (ref 11.4–15.2)

## 2016-02-27 LAB — SEDIMENTATION RATE: SED RATE: 34 mm/h — AB (ref 0–16)

## 2016-02-27 LAB — CBG MONITORING, ED: GLUCOSE-CAPILLARY: 166 mg/dL — AB (ref 65–99)

## 2016-02-27 LAB — C-REACTIVE PROTEIN: CRP: 1.8 mg/dL — AB (ref <1.0)

## 2016-02-27 MED ORDER — TETANUS-DIPHTH-ACELL PERTUSSIS 5-2.5-18.5 LF-MCG/0.5 IM SUSP
0.5000 mL | Freq: Once | INTRAMUSCULAR | Status: AC
  Administered 2016-02-27: 0.5 mL via INTRAMUSCULAR
  Filled 2016-02-27: qty 0.5

## 2016-02-27 MED ORDER — OXYCODONE-ACETAMINOPHEN 5-325 MG PO TABS
1.0000 | ORAL_TABLET | Freq: Four times a day (QID) | ORAL | Status: DC | PRN
  Administered 2016-02-27: 1 via ORAL
  Filled 2016-02-27: qty 1

## 2016-02-27 MED ORDER — METOCLOPRAMIDE HCL 5 MG/ML IJ SOLN: 5.0000 mg | Freq: Four times a day (QID) | INTRAMUSCULAR | Status: DC | PRN

## 2016-02-27 MED ORDER — SODIUM CHLORIDE 0.9 % IV SOLN
INTRAVENOUS | Status: DC
  Administered 2016-02-27 – 2016-02-28 (×2): via INTRAVENOUS

## 2016-02-27 MED ORDER — PIPERACILLIN-TAZOBACTAM 3.375 G IVPB
3.3750 g | Freq: Three times a day (TID) | INTRAVENOUS | Status: DC
  Administered 2016-02-27 – 2016-02-28 (×4): 3.375 g via INTRAVENOUS
  Filled 2016-02-27 (×5): qty 50

## 2016-02-27 MED ORDER — FENTANYL CITRATE (PF) 100 MCG/2ML IJ SOLN
100.0000 ug | Freq: Once | INTRAMUSCULAR | Status: AC
  Administered 2016-02-27: 100 ug via INTRAVENOUS
  Filled 2016-02-27: qty 2

## 2016-02-27 MED ORDER — SODIUM CHLORIDE 0.9 % IV BOLUS (SEPSIS): 2000.0000 mL | Freq: Once | INTRAVENOUS | Status: DC

## 2016-02-27 MED ORDER — ACETAMINOPHEN 325 MG PO TABS: 650.0000 mg | ORAL_TABLET | Freq: Four times a day (QID) | ORAL | Status: DC | PRN

## 2016-02-27 MED ORDER — PIPERACILLIN-TAZOBACTAM 3.375 G IVPB 30 MIN
3.3750 g | Freq: Once | INTRAVENOUS | Status: AC
  Administered 2016-02-27: 3.375 g via INTRAVENOUS
  Filled 2016-02-27: qty 50

## 2016-02-27 MED ORDER — LISINOPRIL 10 MG PO TABS
10.0000 mg | ORAL_TABLET | Freq: Every day | ORAL | Status: DC
  Administered 2016-02-27 – 2016-02-28 (×2): 10 mg via ORAL
  Filled 2016-02-27 (×2): qty 1

## 2016-02-27 MED ORDER — PIPERACILLIN-TAZOBACTAM 3.375 G IVPB 30 MIN: 3.3750 g | Freq: Three times a day (TID) | INTRAVENOUS | Status: DC

## 2016-02-27 MED ORDER — INSULIN GLARGINE 100 UNIT/ML ~~LOC~~ SOLN
10.0000 [IU] | Freq: Every day | SUBCUTANEOUS | Status: DC
Start: 2016-02-27 — End: 2016-02-28
  Administered 2016-02-27: 10 [IU] via SUBCUTANEOUS
  Filled 2016-02-27: qty 0.1

## 2016-02-27 MED ORDER — ONDANSETRON HCL 4 MG/2ML IJ SOLN
4.0000 mg | Freq: Four times a day (QID) | INTRAMUSCULAR | Status: DC | PRN
  Administered 2016-02-28 (×2): 4 mg via INTRAVENOUS
  Filled 2016-02-27: qty 2

## 2016-02-27 MED ORDER — VANCOMYCIN HCL 10 G IV SOLR
1250.0000 mg | Freq: Two times a day (BID) | INTRAVENOUS | Status: DC
  Administered 2016-02-27 – 2016-02-28 (×2): 1250 mg via INTRAVENOUS
  Filled 2016-02-27 (×3): qty 1250

## 2016-02-27 MED ORDER — ONDANSETRON HCL 4 MG/2ML IJ SOLN
4.0000 mg | Freq: Three times a day (TID) | INTRAMUSCULAR | Status: DC | PRN
  Filled 2016-02-27: qty 2

## 2016-02-27 MED ORDER — NICOTINE 21 MG/24HR TD PT24
21.0000 mg | MEDICATED_PATCH | Freq: Every day | TRANSDERMAL | Status: DC
  Administered 2016-02-27: 21 mg via TRANSDERMAL
  Filled 2016-02-27: qty 1

## 2016-02-27 MED ORDER — OXYCODONE-ACETAMINOPHEN 5-325 MG PO TABS
1.0000 | ORAL_TABLET | ORAL | Status: DC | PRN
  Administered 2016-02-27 – 2016-02-28 (×4): 2 via ORAL
  Filled 2016-02-27 (×4): qty 2

## 2016-02-27 MED ORDER — INFLUENZA VAC SPLIT QUAD 0.5 ML IM SUSY
0.5000 mL | PREFILLED_SYRINGE | INTRAMUSCULAR | Status: DC
  Filled 2016-02-27: qty 0.5

## 2016-02-27 MED ORDER — VANCOMYCIN HCL IN DEXTROSE 1-5 GM/200ML-% IV SOLN
1000.0000 mg | Freq: Once | INTRAVENOUS | Status: AC
  Administered 2016-02-27: 1000 mg via INTRAVENOUS
  Filled 2016-02-27: qty 200

## 2016-02-27 MED ORDER — GABAPENTIN 100 MG PO CAPS
200.0000 mg | ORAL_CAPSULE | Freq: Three times a day (TID) | ORAL | Status: DC
  Administered 2016-02-27 – 2016-02-28 (×4): 200 mg via ORAL
  Filled 2016-02-27 (×4): qty 2

## 2016-02-27 MED ORDER — INSULIN ASPART 100 UNIT/ML ~~LOC~~ SOLN
0.0000 [IU] | Freq: Three times a day (TID) | SUBCUTANEOUS | Status: DC
  Administered 2016-02-27: 2 [IU] via SUBCUTANEOUS
  Administered 2016-02-27 – 2016-02-28 (×2): 1 [IU] via SUBCUTANEOUS

## 2016-02-27 MED ORDER — ADULT MULTIVITAMIN W/MINERALS CH
1.0000 | ORAL_TABLET | Freq: Every day | ORAL | Status: DC
  Administered 2016-02-27 – 2016-02-28 (×2): 1 via ORAL
  Filled 2016-02-27 (×2): qty 1

## 2016-02-27 MED ORDER — ACETAMINOPHEN 650 MG RE SUPP: 650.0000 mg | Freq: Four times a day (QID) | RECTAL | Status: DC | PRN

## 2016-02-27 MED ORDER — SODIUM CHLORIDE 0.9 % IV BOLUS (SEPSIS)
500.0000 mL | Freq: Once | INTRAVENOUS | Status: AC
  Administered 2016-02-27: 500 mL via INTRAVENOUS

## 2016-02-27 NOTE — Consult Note (Signed)
WOC Nurse wound consult note Reason for Consult: Chronic, non-healing surgical wound.  Patient followed by Dr. Jonathon BellowsM. Duda for the past year since his trans metatarsal head amputation.  Was released 2 weeks ago to return wearing shoes.  Edema and pressure have contributed to reopening of wound on this foot.  Patient agrees to contact Dr. Audrie Liauda's office upon discharge for continued follow up and oversight.  Additionally, new calluses are emerging on the left foot.  A proactive care plan is discussed with patient and his wife consisting of cleansing and moisturizing, but custom orthotics are indicated bilaterally abd Dr, Audrie Liauda's office can over see that process. Wound type: Neuropathic plus surgical Pressure Ulcer POA: No Measurement:Right foot:  0.4cm x 4cm x 1cm fissure at the medial end of the surgical incision site.  No wound bed visible, scant serous drainage.  Left foot with plantar callus at the 1st metatarsal head.  4th digit with trauma to nail. No drainage. Wound bed: As described above Drainage (amount, consistency, odor) As described above Periwound:intact, dry Dressing procedure/placement/frequency: Conservative orders are placed for Nursing and explained to patient/wife. Petrolatum gauze will be placed over wound on right foot secured with roll gauze and covered with ACE to control edema.  Cleansing and moisturizing to the left foot daily. WOC nursing team will not follow, but will remain available to this patient, the nursing and medical teams.  Please re-consult if needed. Thanks, Ladona MowLaurie Janmarie Smoot, MSN, RN, GNP, Hans EdenCWOCN, CWON-AP, FAAN  Pager# [phone]

## 2016-02-27 NOTE — ED Notes (Signed)
Pt. CBG 166, RN,Taquita made aware.

## 2016-02-27 NOTE — Care Management Note (Signed)
Case Management Note  Patient Details  Name: [name] MRN: [id number] Date of Birth: 09/22/1975  Subjective/Objective: 40 y/o m admitted w/Diabetic r foot infection. From home. Goes to wound care center. Has pcp, pharmacy. Has [facility]-if services needed.CM referral-patient has no CM needs identified.                   Action/Plan:d/c plan home.   Expected Discharge Date:                  Expected Discharge Plan:  Home/Self Care  In-House Referral:     Discharge planning Services  CM Consult  Post Acute Care Choice:    Choice offered to:  NA  DME Arranged:    DME Agency:     HH Arranged:    HH Agency:     Status of Service:  In process, will continue to follow  If discussed at Long Length of Stay Meetings, dates discussed:    Additional Comments:  [name], Curtis Elman, RN 02/27/2016, 11:06 AM

## 2016-02-27 NOTE — Progress Notes (Signed)
Pharmacy Antibiotic Note  [name] is a 40 y.o. male admitted on 02/27/2016 with Wound infection.  Pharmacy has been consulted for Vancomycin dosing.  Plan: Vancomycin 1250mg  IV every 12 hours.  Goal trough 15-20 mcg/mL.  Height: 5\' 11"  (180.3 cm) Weight: 235 lb (106.6 kg) IBW/kg (Calculated) : 75.3  Temp (24hrs), Avg:97.7 F (36.5 C), Min:97.4 F (36.3 C), Max:97.9 F (36.6 C)   Recent Labs Lab 02/27/16 0254 02/27/16 0310 02/27/16 0417  WBC 8.9  --  8.3  CREATININE  --  1.60* 1.79*  LATICACIDVEN  --   --  2.2*    Estimated Creatinine Clearance: 68.8 mL/min (by C-G formula based on SCr of 1.79 mg/dL (H)).    No Known Allergies  Antimicrobials this admission: Vancomycin 02/27/2016 >> Zosyn 02/27/2016 >>   Dose adjustments this admission: -  Microbiology results: pending  Thank you for allowing pharmacy to be a part of this patient's care.  Aleene DavidsonGrimsley Jr, Chance Karam Crowford 02/27/2016 6:15 AM

## 2016-02-27 NOTE — Progress Notes (Signed)
CRITICAL VALUE ALERT  Critical value received:  Lactic acid 2.2  Date of notification:  02/27/2016  Time of notification:  0512  Critical value read back:Yes.    Nurse who received alert:  J.Jencarlos Nicolson RN  MD notified (1st page):  K.Schorr  Time of first page:  0512  MD notified (2nd page):  Time of second page:  Responding MD:    Time MD responded:  Will carry out any new orders.

## 2016-02-27 NOTE — Progress Notes (Signed)
[facility]  40 y/o with medical history significant of hypertension, hyperlipidemia, diabetes mellitus, PVD, pancreatitis, gastroparesis, marijuana abuse, tobacco abuse, GERD, CKD-III, s/p of 8 toe amputation, who presents with right foot ulcer and pain. HPI reviewed, patient examined. Have consulted Dr Lajoyce Cornersuda.   Principal Problem:   Diabetic infection of right foot  - appreciate ortho eval - MRI negative for osteomyelitis or abscess - Dr Lajoyce Cornersuda recommends BID Doxycycline and outpt f/u in 10 days  Active Problems:   DM (diabetes mellitus) type II uncontrolled, periph vascular disorder  -cont Lantus and Novolog- per patient, sugars usually 100-200 range    CKD (chronic kidney disease), stage III - stable    Essential hypertension, benign - Lisinopril  Substance abuse - Cocaine, Marijuana, Tobacco- advised to discontinue  - HIV nonreactive   Curtis CantorSaima Kazzandra Desaulniers, MD

## 2016-02-27 NOTE — Plan of Care (Signed)
Problem: Safety: Goal: Ability to remain free from injury will improve Outcome: Completed/Met Date Met: 02/27/16 Discussed safety precautions/prevention. Pt is agreeable to plan of care. Pt will call for assist

## 2016-02-27 NOTE — Progress Notes (Signed)
Initial Nutrition Assessment  DOCUMENTATION CODES:   Obesity unspecified  INTERVENTION:  - Diet advancement as medically feasible. - RD will follow-up 9/21.  NUTRITION DIAGNOSIS:   Inadequate oral intake related to inability to eat as evidenced by NPO status.  GOAL:   Patient will meet greater than or equal to 90% of their needs  MONITOR:   Diet advancement, Weight trends, Labs, Skin, I & O's  REASON FOR ASSESSMENT:   Consult Wound healing  ASSESSMENT:   40 y.o. male with medical history significant of hypertension, hyperlipidemia, diabetes mellitus, PVD, pancreatitis, gastroparesis, marijuana abuse, tobacco abuse, GERD, CKD-III, s/p of 8 toe amputation, who presents with right foot ulcer and pain. Pt states that he has right foot ulcer and infection, has been followed up in wound care center. He completed 30 day course of doxycycline without significant improvement. Now he has worsening pain and purulent discharge. His pain is constant, 10 out of 10 severity, nonradiating. He has nausea and vomited twice without blood in the vomitus on day of admission.  Pt seen for consult. BMI indicates obesity. Pt out of room, RN reports to MRI and another test. Spoke with pt's wife, who is in the pt's room and provides all information. She states that pt has periods every couple of months where he "seems to get backed up", states like constipation, and that d/t this he will subsequently develop N/V that worsens over the time of each "flare." Notes indicate that pt has hx of gastroparesis. Wife does state that pt has Reglan at home but often stops taking it once his symptoms have resolved. She states most recent "flare" began at the beginning of September and has been slightly worse since that time. D/t this his appetite and PO intakes have been decreasing. She states that they were previously focusing on eating healthy foods and cooking at home but that recently they have been going out to eat  more frequently.   Wife reports that pt takes insulin and that he usually administers it in the AM after waking and in the PM before going to bed. She states that she encourages him to check his blood sugars often but feels that pt checks at random and she feels that he often states he checked CBG but that he may not have. Wife states that pt did not focus on portion control of foods that could alter CBG and that pt eats a large amount of starchy foods.   Per wife, pt's weight fluctuates as "flares" come and go and that he often fluctuates between 230 and 240 lbs. She states his lowest weight in the past ~1 year has been 225 lbs. Noted that only other weight in chart from 2017 was from January and that CBW is a duplicate of that weight. Recommend weighing pt if this has not been done this admission.  Unable to complete physical assessment with pt out of the room. Will follow-up tomorrow if diet is advanced and will perform physical assessment at that time.   Medications reviewed; sliding scale Novolog, 10 units Lantus/day, 5 mg IV Reglan QID PRN, daily multivitamin with minerals, PRN Zofran. Labs reviewed; CBGs: 106-190 mg/dL, creatinine: 1.611.79 mg/dL, GFR: 53 mL/min. IVF: NS @ 100 mL/hr.   Diet Order:  Diet NPO time specified Except for: Sips with Meds  Skin:  Reviewed, no issues (L foot cellulitis)  Last BM:  9/19  Height:   Ht Readings from Last 1 Encounters:  02/27/16 5\' 11"  (1.803 m)  Weight:   Wt Readings from Last 1 Encounters:  02/27/16 235 lb (106.6 kg)    Ideal Body Weight:  78.18 kg  BMI:  Body mass index is 32.78 kg/m.  Estimated Nutritional Needs:   Kcal:  2000-2200  Protein:  80-90 grams  Fluid:  2 L/day  EDUCATION NEEDS:   No education needs identified at this time   Trenton Gammon, MS, RD, LDN Inpatient Clinical Dietitian Pager # [phone] After hours/weekend pager # [phone]

## 2016-02-27 NOTE — Progress Notes (Signed)
VASCULAR LAB PRELIMINARY  ARTERIAL  ABI completed:    RIGHT    LEFT    PRESSURE WAVEFORM  PRESSURE WAVEFORM  BRACHIAL 157 Triphasic BRACHIAL 152 Triphasic  AT 196 Triphasic AT 177 Triphasic  PT 175 Triphasic PT 194 Triphasic    RIGHT LEFT  ABI 1.25 1.24   ABIs and Doppler waveforms are within normal limits bilaterally at rest  [name], RVS 02/27/2016, 3:16 PM

## 2016-02-27 NOTE — Progress Notes (Signed)
Clinical Social Work consult: referred for medication assistance. Deferred to Case Management at this time. Inappropriate CSW referral.  If CSW needs arise, please re-consult.   Rush Salce, LCSW [facility] Clinical Social Worker cell #: [phone]  

## 2016-02-27 NOTE — Progress Notes (Signed)
Inpatient Diabetes Program Recommendations  AACE/ADA: New Consensus Statement on Inpatient Glycemic Control (2015)  Target Ranges:  Prepandial:   less than 140 mg/dL      Peak postprandial:   less than 180 mg/dL (1-2 hours)      Critically ill patients:  140 - 180 mg/dL   Lab Results  Component Value Date   GLUCAP 106 (H) 02/27/2016   HGBA1C 6.4 (H) 05/28/2015    Review of Glycemic Control  Diabetes history: DM2 Outpatient Diabetes medications: Lantus 15 units QHS, Novolog 3 units tidwc Current orders for Inpatient glycemic control:  Lantus 10 units QHS, Novolog sensitive tidwc  Spoke with pt and wife. States he on occasion has hypoglycemia in early am. States last HgbA1C at MD office was 7.9%. Sees PCP frequently.  Recommendations: Add Novolog 3 units tidwc.  Will continue to follow. Thank you. Ailene Ardshonda Nariah Morgano, RD, LDN, CDE Inpatient Diabetes Coordinator [phone]

## 2016-02-27 NOTE — ED Triage Notes (Signed)
Pt has a diabetic ulcer on his right foot and has been treated before with doxycycline, it's now swollen and draining again

## 2016-02-27 NOTE — ED Provider Notes (Signed)
[facility] DEPT Provider Note   CSN: [id number] Arrival date & time: 02/27/16  4098  By signing my name below, I, Dora Sims, attest that this documentation has been prepared under the direction and in the presence of physician practitioner, Nero Sawatzky, MD. Electronically Signed: Dora Sims, Scribe. 02/27/2016. 2:40 AM.  History   Chief Complaint Chief Complaint  Patient presents with  . Foot Pain    The history is provided by the patient. No language interpreter was used.  Foot Pain  This is a recurrent problem. The current episode started more than 1 week ago. The problem occurs constantly. The problem has been gradually worsening.    HPI Comments: Lashan Gluth is a 40 y.o. male who presents to the Emergency Department complaining of constant, worsening, right foot pain for the last month. Pt notes associated right foot swelling and drainage. He reports he recently completed a doxycycline prescription with no relief of his symptoms; he is followed by Dr. Sharol Given for this and last saw him 2 weeks ago. Pt has had his right toes amputated due to diabetic foot ulcers; the amputation was about a year ago. He also notes some vomiting since onset. He is unsure of his tetanus status. Pt denies fever or any other associated symptoms.  Past Medical History:  Diagnosis Date  . Acute osteomyelitis, ankle and foot 07/30/2010   Qualifier: Diagnosis of  By: Tommy Medal MD, Roderic Scarce    . Anemia   . Chronic kidney disease (CKD), stage III (moderate)   . Collagen vascular disease ([place])   . DDD (degenerative disc disease), lumbar   . Gastroparesis   . GERD (gastroesophageal reflux disease)   . Hiatal hernia   . Hyperlipidemia   . Hypertension   . Pancreatitis   . Peripheral vascular disease ([place])   . Renal insufficiency   . Type II diabetes mellitus ([place]) dx'd ~ 1996  . Vascular disease    poor circulation to left foot    Patient Active Problem List   Diagnosis Date Noted  .  Gastroparesis 05/28/2015  . GERD (gastroesophageal reflux disease) 10/01/2014  . Right foot infection 10/01/2014  . Osteomyelitis of foot, right, acute ([place]) 10/01/2014  . Diabetic foot ulcer ([place]) 10/01/2014  . Esophageal reflux   . Fever 09/27/2014  . Abdominal pain, lower 09/27/2014  . Abnormal ECG 05/30/2014  . Chest pain 05/30/2014  . Ankle pain 05/30/2014  . Pain in the chest   . Nausea 08/30/2013  . Epigastric abdominal pain 08/30/2013  . Low grade fever 08/30/2013  . Diabetic foot ulcer with osteomyelitis ([place]) 05/12/2013  . Osteomyelitis ([place]) 05/12/2013  . Diabetic osteomyelitis b/l toes 03/23/2013  . DM (diabetes mellitus) type II uncontrolled, periph vascular disorder ([place]) 03/23/2013  . CKD (chronic kidney disease), stage III 03/23/2013  . Anemia 03/23/2013  . Essential hypertension, benign 03/23/2013  . Diabetic foot ulcers ([place]) 03/22/2013  . AKI (acute kidney injury) ([place]) 03/22/2013  . Viral gastroenteritis 09/17/2012  . Nausea & vomiting 09/16/2012  . Acute pancreatitis 09/16/2012  . Diabetes mellitus ([place]) 09/20/2010  . Onychomycosis 09/20/2010  . METHICILLIN SUSCEPTIBLE STAPH AUREUS SEPTICEMIA 07/30/2010  . ACUTE OSTEOMYELITIS, ANKLE AND FOOT 07/30/2010    Past Surgical History:  Procedure Laterality Date  . AMPUTATION  04/25/2011   Procedure: AMPUTATION DIGIT;  Surgeon: Newt Minion, MD;  Location: [facility];  Service: Orthopedics;  Laterality: Left;  Left foot 3rd toe amputation MTP joint, Gastroc Recession  Achilles Lengthening   . AMPUTATION Bilateral  03/25/2013   Procedure: AMPUTATION RAY;  Surgeon: Newt Minion, MD;  Location: [facility];  Service: Orthopedics;  Laterality: Bilateral;  Left Great Toe Amputation at  MTP Joint, Right 1st and 2nd Ray Amputation   . AMPUTATION Right 10/03/2014   Procedure: AMPUTATION MIDFOOT;  Surgeon: Newt Minion, MD;  Location: [facility];  Service: Orthopedics;  Laterality: Right;  . LAPAROSCOPIC CHOLECYSTECTOMY    . TOE  AMPUTATION  2012   left foot; great toe and second toe       Home Medications    Prior to Admission medications   Medication Sig Start Date End Date Taking? Authorizing Provider  blood glucose meter kit and supplies KIT Dispense based on patient and insurance preference. Use to check blood sugar two times daily as directed. (FOR ICD-9 250.00, 250.01). 09/30/14   Barton Dubois, MD  doxycycline (VIBRAMYCIN) 100 MG capsule Take 1 capsule (100 mg total) by mouth 2 (two) times daily. 12/27/15   Samantha Tripp Dowless, PA-C  gabapentin (NEURONTIN) 100 MG capsule Take 2 capsules (200 mg total) by mouth 3 (three) times daily. 05/30/14   Silver Huguenin Elgergawy, MD  insulin aspart (NOVOLOG) 100 UNIT/ML injection Inject 3 Units into the skin 3 (three) times daily with meals. 10/05/14   Belkys A Regalado, MD  insulin glargine (LANTUS) 100 UNIT/ML injection Inject 0.15 mLs (15 Units total) into the skin at bedtime. 10/05/14   Belkys A Regalado, MD  Insulin Pen Needle 31G X 5 MM MISC Use one needle to inject insulin with flexpen as indicated twice a day 09/30/14   Barton Dubois, MD  lisinopril (PRINIVIL,ZESTRIL) 20 MG tablet Take 20 mg by mouth daily.    Historical Provider, MD  oxyCODONE-acetaminophen (PERCOCET/ROXICET) 5-325 MG tablet Take 1 tablet by mouth every 6 (six) hours as needed for severe pain. 12/25/15   Shary Decamp, PA-C    Family History Family History  Problem Relation Age of Onset  . Heart attack Father 66  . Hypertension Sister     Social History Social History  Substance Use Topics  . Smoking status: Current Every Day Smoker    Packs/day: 0.10    Years: 4.00    Types: Cigarettes  . Smokeless tobacco: Never Used  . Alcohol use No     Allergies   Review of patient's allergies indicates no known allergies.   Review of Systems Review of Systems  Constitutional: Negative for fever.  Gastrointestinal: Positive for vomiting.  Musculoskeletal: Positive for arthralgias (right foot)  and joint swelling (right ankle; right lower leg).  All other systems reviewed and are negative.   Physical Exam Updated Vital Signs BP 118/88   Pulse 76   Temp 97.4 F (36.3 C) (Oral)   Resp 18   SpO2 99%   Physical Exam  Constitutional: He appears well-developed and well-nourished.  HENT:  Head: Normocephalic.  Mouth/Throat: Oropharynx is clear and moist. No oropharyngeal exudate.  Eyes: Conjunctivae and EOM are normal. Pupils are equal, round, and reactive to light. Right eye exhibits no discharge. Left eye exhibits no discharge. No scleral icterus.  Neck: Normal range of motion. Neck supple. No JVD present. No tracheal deviation present.  Trachea is midline. No stridor or carotid bruits.  Cardiovascular: Normal rate, regular rhythm, normal heart sounds and intact distal pulses.   No murmur heard. Pulmonary/Chest: Effort normal and breath sounds normal. No stridor. No respiratory distress. He has no wheezes. He has no rales.  Lungs CTA bilaterally.  Abdominal: Soft. Bowel sounds  are normal. He exhibits no distension. There is no tenderness. There is no rebound and no guarding.  Musculoskeletal: Normal range of motion. He exhibits no edema or tenderness.       Right foot: There is swelling. There is no crepitus.       Feet:  Right foot: compartments are soft. No erythema. No fluctuance. Swelling noted from the right ankle to 4 inches above the ankle joint. Cracking consistent with athlete's foot. Intact DP pulse. Ulceration over where 1st and 2nd metatarsal would be with central eschar; crusting and lesions noted. Eschar ulceration below metatarsal onto dorsum with crusting.  Lymphadenopathy:    He has no cervical adenopathy.  Neurological: He is alert. He has normal reflexes.  Skin: Skin is warm and dry. Capillary refill takes less than 2 seconds.  Psychiatric: He has a normal mood and affect. His behavior is normal.  Nursing note and vitals reviewed.   ED Treatments /  Results   Vitals:   02/27/16 0110 02/27/16 0356  BP: 118/88 177/77  Pulse: 76 69  Resp: 18 16  Temp: 97.4 F (36.3 C)    Results for orders placed or performed during the hospital encounter of 02/27/16  CBC with Differential/Platelet  Result Value Ref Range   WBC 8.9 4.0 - 10.5 K/uL   RBC 3.92 (L) 4.22 - 5.81 MIL/uL   Hemoglobin 12.1 (L) 13.0 - 17.0 g/dL   HCT 35.8 (L) 39.0 - 52.0 %   MCV 91.3 78.0 - 100.0 fL   MCH 30.9 26.0 - 34.0 pg   MCHC 33.8 30.0 - 36.0 g/dL   RDW 13.7 11.5 - 15.5 %   Platelets 200 150 - 400 K/uL   Neutrophils Relative % 63 %   Neutro Abs 5.6 1.7 - 7.7 K/uL   Lymphocytes Relative 29 %   Lymphs Abs 2.6 0.7 - 4.0 K/uL   Monocytes Relative 5 %   Monocytes Absolute 0.5 0.1 - 1.0 K/uL   Eosinophils Relative 3 %   Eosinophils Absolute 0.2 0.0 - 0.7 K/uL   Basophils Relative 0 %   Basophils Absolute 0.0 0.0 - 0.1 K/uL  POC CBG, ED  Result Value Ref Range   Glucose-Capillary 166 (H) 65 - 99 mg/dL  I-stat chem 8, ed  Result Value Ref Range   Sodium 140 135 - 145 mmol/L   Potassium 3.9 3.5 - 5.1 mmol/L   Chloride 103 101 - 111 mmol/L   BUN 21 (H) 6 - 20 mg/dL   Creatinine, Ser 1.60 (H) 0.61 - 1.24 mg/dL   Glucose, Bld 164 (H) 65 - 99 mg/dL   Calcium, Ion 1.18 1.15 - 1.40 mmol/L   TCO2 27 0 - 100 mmol/L   Hemoglobin 12.9 (L) 13.0 - 17.0 g/dL   HCT 38.0 (L) 39.0 - 52.0 %   Dg Foot Complete Right  Result Date: 02/27/2016 CLINICAL DATA:  Diabetic ulceration along the plantar aspect of the right foot, acute onset. Initial encounter. EXAM: RIGHT FOOT COMPLETE - 3+ VIEW COMPARISON:  Right foot radiographs performed 12/31/2015 FINDINGS: There is chronic amputation involving all of the second through fifth distal metatarsals, and also the base of the first metatarsal. No definite focal osseous erosion is seen to suggest osteomyelitis, though evaluation for osteomyelitis is limited on radiograph. A small posterior calcaneal spur is noted. An os peroneum is seen.  Soft tissue swelling is noted at the forefoot, with an associated large soft tissue defect. No definite abnormal soft tissue air is seen.  IMPRESSION: 1. Large soft tissue defect at the forefoot. No abnormal soft tissue air seen. No definite focal osseous erosion seen to suggest osteomyelitis, though evaluation for osteomyelitis is limited on radiograph. 2. Underlying chronic amputation involving the base of the first metatarsal, and the second through fifth distal metatarsals. 3. Os peroneum noted. Electronically Signed   By: Garald Balding [name]   On: 02/27/2016 03:39   Medications  vancomycin (VANCOCIN) IVPB 1000 mg/200 mL premix (1,000 mg Intravenous New Bag/Given 02/27/16 0356)  Tdap (BOOSTRIX) injection 0.5 mL (0.5 mLs Intramuscular Given 02/27/16 0312)  piperacillin-tazobactam (ZOSYN) IVPB 3.375 g (0 g Intravenous Stopped 02/27/16 0356)  fentaNYL (SUBLIMAZE) injection 100 mcg (100 mcg Intravenous Given 02/27/16 0324)   Radiology No results found.  Procedures Procedures (including critical care time)  DIAGNOSTIC STUDIES: Oxygen Saturation is 99% on RA, normal by my interpretation.    COORDINATION OF CARE: 2:40 AM Discussed treatment plan with pt at bedside and pt agreed to plan.  Medications Ordered in ED Medications - No data to display   Initial Impression / Assessment and Plan / ED Course  I have reviewed the triage vital signs and the nursing notes.  Pertinent labs & imaging results that were available during my care of the patient were reviewed by me and considered in my medical decision making (see chart for details).  Clinical Course    Diabetic foot infection with wound,  Failure of outpatient management.  IV antibiotics and admission.    I personally performed the services described in this documentation, which was scribed in my presence. The recorded information has been reviewed and is accurate.     Final Clinical Impressions(s) / ED Diagnoses   Final diagnoses:    None    New Prescriptions New Prescriptions   No medications on file     Keevon Henney, MD 02/27/16 0403

## 2016-02-27 NOTE — H&P (Addendum)
History and Physical    [name] TIR:[id number] DOB: [birth year] DOA: 02/27/2016  Referring MD/NP/PA:   PCP: [name], MD   Patient coming from:  The patient is coming from home.  At baseline, pt is independent for most of ADL.      Chief Complaint: right foot ulcer and pain  HPI: [name] is a 40 y.o. male with medical history significant of hypertension, hyperlipidemia, diabetes mellitus, PVD, pancreatitis, gastroparesis, marijuana abuse, tobacco abuse, GERD, CKD-III, s/p of 8 toe amputation, who presents with right foot ulcer and pain.  Pt states that he has right foot ulcer and infection, has been followed up in wound care center. He completed 30 day course of doxycycline without significant improvement. Now he has worsening pain and purulent discharge. His pain is constant, 10 out of 10 severity, nonradiating. It is not aggravated or deviated any known factors. He is followed by Dr. Sharol Given for this and last saw him 2 weeks ago. Patient does not have fever or chills. He has nausea and vomited twice without blood in the vomitus today. He denies abdominal pain, diarrhea, chest pain, shortness of breath, cough, symptoms of UTI. No unilateral weakness.  ED Course: pt was found to have WBC 8.9, temperature normal, no tachycardia, no tachypnea, stable renal function. X-ray of right foot do not show osteomyelitis. Patient is placed on MedSurg bed for observation.  Review of Systems:   General: no fevers, chills, no changes in body weight, has poor appetite, has fatigue HEENT: no blurry vision, hearing changes or sore throat Respiratory: no dyspnea, coughing, wheezing CV: no chest pain, no palpitations GI: has nausea, vomiting, no abdominal pain, diarrhea, constipation GU: no dysuria, burning on urination, increased urinary frequency, hematuria  Ext: s/p of toe amputation in the first three of left foot and all toes in right foot. Has ulcer and pain in right foot.  Neuro: no  unilateral weakness, numbness, or tingling, no vision change or hearing loss Skin: no rash.  MSK: No muscle spasm, no deformity, no limitation of range of movement in spin Heme: No easy bruising.  Travel history: No recent long distant travel.  Allergy: No Known Allergies  Past Medical History:  Diagnosis Date  . Acute osteomyelitis, ankle and foot 07/30/2010   Qualifier: Diagnosis of  By: Tommy Medal MD, Roderic Scarce    . Anemia   . Chronic kidney disease (CKD), stage III (moderate)   . Collagen vascular disease ([place])   . DDD (degenerative disc disease), lumbar   . Gastroparesis   . GERD (gastroesophageal reflux disease)   . Hiatal hernia   . Hyperlipidemia   . Hypertension   . Pancreatitis   . Peripheral vascular disease ([place])   . Renal insufficiency   . Type II diabetes mellitus ([place]) dx'd ~ 1996  . Vascular disease    poor circulation to left foot    Past Surgical History:  Procedure Laterality Date  . AMPUTATION  04/25/2011   Procedure: AMPUTATION DIGIT;  Surgeon: Newt Minion, MD;  Location: [place];  Service: Orthopedics;  Laterality: Left;  Left foot 3rd toe amputation MTP joint, Gastroc Recession  Achilles Lengthening   . AMPUTATION Bilateral 03/25/2013   Procedure: AMPUTATION RAY;  Surgeon: Newt Minion, MD;  Location: [place];  Service: Orthopedics;  Laterality: Bilateral;  Left Great Toe Amputation at  MTP Joint, Right 1st and 2nd Ray Amputation   . AMPUTATION Right 10/03/2014   Procedure: AMPUTATION MIDFOOT;  Surgeon: Newt Minion,  MD;  Location: [place];  Service: Orthopedics;  Laterality: Right;  . LAPAROSCOPIC CHOLECYSTECTOMY    . TOE AMPUTATION  2012   left foot; great toe and second toe    Social History:  reports that he has been smoking Cigarettes.  He has a 0.40 pack-year smoking history. He has never used smokeless tobacco. He reports that he uses drugs, including Marijuana, about 10 times per week. He reports that he does not drink alcohol.  Family History:    Family History  Problem Relation Age of Onset  . Heart attack Father 17  . Hypertension Sister      Prior to Admission medications   Medication Sig Start Date End Date Taking? Authorizing Provider  insulin aspart (NOVOLOG) 100 UNIT/ML injection Inject 3 Units into the skin 3 (three) times daily with meals. 10/05/14  Yes Belkys A Regalado, MD  insulin glargine (LANTUS) 100 UNIT/ML injection Inject 0.15 mLs (15 Units total) into the skin at bedtime. 10/05/14  Yes Belkys A Regalado, MD  lisinopril (PRINIVIL,ZESTRIL) 10 MG tablet Take 10 mg by mouth daily.   Yes Historical Provider, MD  Multiple Vitamin (MULTIVITAMIN WITH MINERALS) TABS tablet Take 1 tablet by mouth daily.   Yes Historical Provider, MD  blood glucose meter kit and supplies KIT Dispense based on patient and insurance preference. Use to check blood sugar two times daily as directed. (FOR ICD-9 250.00, 250.01). 09/30/14   Barton Dubois, MD  doxycycline (VIBRAMYCIN) 100 MG capsule Take 1 capsule (100 mg total) by mouth 2 (two) times daily. Patient not taking: Reported on 02/27/2016 12/27/15   Samantha Tripp Dowless, PA-C  gabapentin (NEURONTIN) 100 MG capsule Take 2 capsules (200 mg total) by mouth 3 (three) times daily. Patient not taking: Reported on 02/27/2016 05/30/14   Albertine Patricia, MD  Insulin Pen Needle 31G X 5 MM MISC Use one needle to inject insulin with flexpen as indicated twice a day 09/30/14   Barton Dubois, MD  oxyCODONE-acetaminophen (PERCOCET/ROXICET) 5-325 MG tablet Take 1 tablet by mouth every 6 (six) hours as needed for severe pain. Patient not taking: Reported on 02/27/2016 12/25/15   Shary Decamp, PA-C    Physical Exam: Vitals:   02/27/16 0110 02/27/16 0356  BP: 118/88 177/77  Pulse: 76 69  Resp: 18 16  Temp: 97.4 F (36.3 C)   TempSrc: Oral   SpO2: 99% 98%   General: Not in acute distress HEENT:       Eyes: PERRL, EOMI, no scleral icterus.       ENT: No discharge from the ears and nose, no pharynx  injection, no tonsillar enlargement.        Neck: No JVD, no bruit, no mass felt. Heme: No neck lymph node enlargement. Cardiac: S1/S2, RRR, No murmurs, No gallops or rubs. Respiratory: No rales, wheezing, rhonchi or rubs. GI: Soft, nondistended, nontender, no rebound pain, no organomegaly, BS present. GU: No hematuria Ext: intact DP/PT pulse bilaterally. There is an ulcer in the tip of right foot, with central eschar and purulent discharge and foul smile. Has swelling from the right ankle to 4 inches above the ankle joint.  Musculoskeletal: No joint deformities, No joint redness or warmth, no limitation of ROM in spin. Skin: No rashes.  Neuro: Alert, oriented X3, cranial nerves II-XII grossly intact, moves all extremities normally.  Psych: Patient is not psychotic, no suicidal or hemocidal ideation.  Labs on Admission: I have personally reviewed following labs and imaging studies  CBC:  Recent Labs  Lab 02/27/16 0254 02/27/16 0310  WBC 8.9  --   NEUTROABS 5.6  --   HGB 12.1* 12.9*  HCT 35.8* 38.0*  MCV 91.3  --   PLT 200  --    Basic Metabolic Panel:  Recent Labs Lab 02/27/16 0310  NA 140  K 3.9  CL 103  GLUCOSE 164*  BUN 21*  CREATININE 1.60*   GFR: CrCl cannot be calculated (Unknown ideal weight.). Liver Function Tests: No results for input(s): AST, ALT, ALKPHOS, BILITOT, PROT, ALBUMIN in the last 168 hours. No results for input(s): LIPASE, AMYLASE in the last 168 hours. No results for input(s): AMMONIA in the last 168 hours. Coagulation Profile: No results for input(s): INR, PROTIME in the last 168 hours. Cardiac Enzymes: No results for input(s): CKTOTAL, CKMB, CKMBINDEX, TROPONINI in the last 168 hours. BNP (last 3 results) No results for input(s): PROBNP in the last 8760 hours. HbA1C: No results for input(s): HGBA1C in the last 72 hours. CBG:  Recent Labs Lab 02/27/16 0208  GLUCAP 166*   Lipid Profile: No results for input(s): CHOL, HDL, LDLCALC,  TRIG, CHOLHDL, LDLDIRECT in the last 72 hours. Thyroid Function Tests: No results for input(s): TSH, T4TOTAL, FREET4, T3FREE, THYROIDAB in the last 72 hours. Anemia Panel: No results for input(s): VITAMINB12, FOLATE, FERRITIN, TIBC, IRON, RETICCTPCT in the last 72 hours. Urine analysis:    Component Value Date/Time   COLORURINE YELLOW 05/31/2015 0820   APPEARANCEUR CLOUDY (A) 05/31/2015 0820   LABSPEC 1.017 05/31/2015 0820   PHURINE 7.0 05/31/2015 0820   GLUCOSEU NEGATIVE 05/31/2015 0820   HGBUR MODERATE (A) 05/31/2015 0820   BILIRUBINUR NEGATIVE 05/31/2015 0820   KETONESUR NEGATIVE 05/31/2015 0820   PROTEINUR >300 (A) 05/31/2015 0820   UROBILINOGEN 2.0 (H) 09/27/2014 1844   NITRITE NEGATIVE 05/31/2015 0820   LEUKOCYTESUR NEGATIVE 05/31/2015 0820   Sepsis Labs: '@LABRCNTIP'$ (procalcitonin:4,lacticidven:4) )No results found for this or any previous visit (from the past 240 hour(s)).   Radiological Exams on Admission: Dg Foot Complete Right  Result Date: 02/27/2016 CLINICAL DATA:  Diabetic ulceration along the plantar aspect of the right foot, acute onset. Initial encounter. EXAM: RIGHT FOOT COMPLETE - 3+ VIEW COMPARISON:  Right foot radiographs performed 12/31/2015 FINDINGS: There is chronic amputation involving all of the second through fifth distal metatarsals, and also the base of the first metatarsal. No definite focal osseous erosion is seen to suggest osteomyelitis, though evaluation for osteomyelitis is limited on radiograph. A small posterior calcaneal spur is noted. An os peroneum is seen. Soft tissue swelling is noted at the forefoot, with an associated large soft tissue defect. No definite abnormal soft tissue air is seen. IMPRESSION: 1. Large soft tissue defect at the forefoot. No abnormal soft tissue air seen. No definite focal osseous erosion seen to suggest osteomyelitis, though evaluation for osteomyelitis is limited on radiograph. 2. Underlying chronic amputation involving  the base of the first metatarsal, and the second through fifth distal metatarsals. 3. Os peroneum noted. Electronically Signed   By: Garald Balding [name]   On: 02/27/2016 03:39     EKG:  Not done in ED, will get one.   Assessment/Plan Principal Problem:   Diabetic infection of right foot ([place]) Active Problems:   Diabetes mellitus ([place])   DM (diabetes mellitus) type II uncontrolled, periph vascular disorder ([facility])   CKD (chronic kidney disease), stage III   Essential hypertension, benign   Diabetic foot infection ([place])   Tobacco abuse   Diabetic infection of right foot ([place]):  Patient is not septic. No fever or leukocytosis. Hemodynamically stable. X-ray did not show osteomyelitis. He failed oral doxycycline treatment as outpatient.  -will place on med-surg bed for obs - Empiric antimicrobial treatment with vancomycin and Zosyn per pharmacy - PRN Zofran for nausea, Percocet for pain - Blood cultures x 2  - ESR and CRP - wound care consult - MRI-right foot and ABI - will check lactic acid levels - IVF: 2L of NS bolus in ED, followed by 100 cc/h - Please consult ortho in AM  DM-II: Last A1c 6.4 on 05/28/15, well controled. Patient is taking Lantus and NovoLog at home -will decrease Lantus dose from 15-10 units daily -SSI  CKD (chronic kidney disease), stage III: Stable. Baseline creatinine 1.4-1.7. His creatinine is 1.60, BUN 21 on admission. -Follow-up renal function by BMP  Essential hypertension, benign -Continue lisinopril  Substance abuse: Including tobacco abuse and marijuana abuse -Did counseling about importance of quiting substance -Nicotine patch -Check  UDS   DVT ppx: SCD Code Status: Full code Family Communication: None at bed side.  Disposition Plan:  Anticipate discharge back to previous home environment Consults called: none   Admission status: medical floor/obs  Date of Service 02/27/2016    Ivor Costa [facility] Pager [phone]  If  7PM-7AM, please contact night-coverage [email or web address] Password [facility] 02/27/2016, 4:27 AM

## 2016-02-27 NOTE — Consult Note (Signed)
ORTHOPAEDIC CONSULTATION  REQUESTING PHYSICIAN: Debbe Odea, MD  Chief Complaint: Ulceration with drainage right transmetatarsal amputation  HPI: [name] is a 40 y.o. male who presents with recurrent episodes of ulceration callus and drainage from his right transmetatarsal amputation.  Past Medical History:  Diagnosis Date  . Acute osteomyelitis, ankle and foot 07/30/2010   Qualifier: Diagnosis of  By: Tommy Medal MD, Roderic Scarce    . Anemia   . Chronic kidney disease (CKD), stage III (moderate)   . Collagen vascular disease ([place])   . DDD (degenerative disc disease), lumbar   . Gastroparesis   . GERD (gastroesophageal reflux disease)   . Hiatal hernia   . Hyperlipidemia   . Hypertension   . Pancreatitis   . Peripheral vascular disease ([place])   . Renal insufficiency   . Type II diabetes mellitus ([place]) dx'd ~ 1996  . Vascular disease    poor circulation to left foot   Past Surgical History:  Procedure Laterality Date  . AMPUTATION  04/25/2011   Procedure: AMPUTATION DIGIT;  Surgeon: Newt Minion, MD;  Location: [place];  Service: Orthopedics;  Laterality: Left;  Left foot 3rd toe amputation MTP joint, Gastroc Recession  Achilles Lengthening   . AMPUTATION Bilateral 03/25/2013   Procedure: AMPUTATION RAY;  Surgeon: Newt Minion, MD;  Location: [place];  Service: Orthopedics;  Laterality: Bilateral;  Left Great Toe Amputation at  MTP Joint, Right 1st and 2nd Ray Amputation   . AMPUTATION Right 10/03/2014   Procedure: AMPUTATION MIDFOOT;  Surgeon: Newt Minion, MD;  Location: [place];  Service: Orthopedics;  Laterality: Right;  . LAPAROSCOPIC CHOLECYSTECTOMY    . TOE AMPUTATION  2012   left foot; great toe and second toe   Social History   Social History  . Marital status: Divorced    Spouse name: N/A  . Number of children: N/A  . Years of education: N/A   Social History Main Topics  . Smoking status: Current Every Day Smoker    Packs/day: 0.10    Years: 4.00   Types: Cigarettes  . Smokeless tobacco: Never Used  . Alcohol use No  . Drug use:     Frequency: 10.0 times per week    Types: Marijuana  . Sexual activity: Yes    Birth control/ protection: None   Other Topics Concern  . None   Social History Narrative  . None   Family History  Problem Relation Age of Onset  . Heart attack Father 71  . Hypertension Sister    - negative except otherwise stated in the family history section No Known Allergies Prior to Admission medications   Medication Sig Start Date End Date Taking? Authorizing Provider  insulin aspart (NOVOLOG) 100 UNIT/ML injection Inject 3 Units into the skin 3 (three) times daily with meals. 10/05/14  Yes Belkys A Regalado, MD  insulin glargine (LANTUS) 100 UNIT/ML injection Inject 0.15 mLs (15 Units total) into the skin at bedtime. 10/05/14  Yes Belkys A Regalado, MD  lisinopril (PRINIVIL,ZESTRIL) 10 MG tablet Take 10 mg by mouth daily.   Yes Historical Provider, MD  Multiple Vitamin (MULTIVITAMIN WITH MINERALS) TABS tablet Take 1 tablet by mouth daily.   Yes Historical Provider, MD  blood glucose meter kit and supplies KIT Dispense based on patient and insurance preference. Use to check blood sugar two times daily as directed. (FOR ICD-9 250.00, 250.01). 09/30/14   Barton Dubois, MD  doxycycline (VIBRAMYCIN) 100 MG capsule Take 1 capsule (  100 mg total) by mouth 2 (two) times daily. Patient not taking: Reported on 02/27/2016 12/27/15   Samantha Tripp Dowless, PA-C  gabapentin (NEURONTIN) 100 MG capsule Take 2 capsules (200 mg total) by mouth 3 (three) times daily. Patient not taking: Reported on 02/27/2016 05/30/14   Starleen Arms, MD  Insulin Pen Needle 31G X 5 MM MISC Use one needle to inject insulin with flexpen as indicated twice a day 09/30/14   Vassie Loll, MD  oxyCODONE-acetaminophen (PERCOCET/ROXICET) 5-325 MG tablet Take 1 tablet by mouth every 6 (six) hours as needed for severe pain. Patient not taking: Reported on  02/27/2016 12/25/15   Audry Pili, PA-C   Mr Foot Right Wo Contrast  Result Date: 02/27/2016 CLINICAL DATA:  Large open wound on the plantar surface of the right foot. History of prior first ray amputation. History of prior amputation at the level of the mid second through fifth metatarsals. EXAM: MRI OF THE RIGHT FOREFOOT WITHOUT CONTRAST TECHNIQUE: Multiplanar, multisequence MR imaging was performed. No intravenous contrast was administered. COMPARISON:  Plain films of the right foot 02/27/2016 and 12/31/2015 FINDINGS: A soft tissue wound is seen at the patient's stump. No underlying abscess is identified. No bone marrow signal to suggest osteomyelitis is seen. The patient has degenerative change of the second and third tarsometatarsal joints, worst at the second. Marrow edema extending into the mid diaphysis of the second metatarsal is most consistent with stress change. There is no evidence of septic joint. Intrinsic musculature the foot is atrophied. IMPRESSION: Negative for abscess or osteomyelitis with a skin ulceration at the patient's stump noted. Second worse than third tarsometatarsal osteoarthritis. Marrow edema in the proximal 1/2 of the second metatarsal is consistent with stress change related to degenerative disease. Electronically Signed   By: Drusilla Kanner [name]   On: 02/27/2016 14:46   Dg Foot Complete Right  Result Date: 02/27/2016 CLINICAL DATA:  Diabetic ulceration along the plantar aspect of the right foot, acute onset. Initial encounter. EXAM: RIGHT FOOT COMPLETE - 3+ VIEW COMPARISON:  Right foot radiographs performed 12/31/2015 FINDINGS: There is chronic amputation involving all of the second through fifth distal metatarsals, and also the base of the first metatarsal. No definite focal osseous erosion is seen to suggest osteomyelitis, though evaluation for osteomyelitis is limited on radiograph. A small posterior calcaneal spur is noted. An os peroneum is seen. Soft tissue swelling is  noted at the forefoot, with an associated large soft tissue defect. No definite abnormal soft tissue air is seen. IMPRESSION: 1. Large soft tissue defect at the forefoot. No abnormal soft tissue air seen. No definite focal osseous erosion seen to suggest osteomyelitis, though evaluation for osteomyelitis is limited on radiograph. 2. Underlying chronic amputation involving the base of the first metatarsal, and the second through fifth distal metatarsals. 3. Os peroneum noted. Electronically Signed   By: Roanna Raider [name]   On: 02/27/2016 03:39   - pertinent xrays, CT, MRI studies were reviewed and independently interpreted  Positive ROS: All other systems have been reviewed and were otherwise negative with the exception of those mentioned in the HPI and as above.  Physical Exam: General: Alert, no acute distress Psychiatric: Patient is competent for consent with normal mood and affect Lymphatic: No axillary or cervical lymphadenopathy Cardiovascular: No pedal edema Respiratory: No cyanosis, no use of accessory musculature GI: No organomegaly, abdomen is soft and non-tender  Skin: On examination patient has hard callus over the transmetatarsal amputation from increased pressure. Patient states  these works 3 days a week at [employer] on his feet and is on his feet quite a lot for transmetatarsal amputation. There is no redness no cellulitis no drainage today no signs of abscess.   Neurologic: Patient does not have protective sensation bilateral lower extremities.   MUSCULOSKELETAL:  On examination patient has a palpable pulse ankle-brachial indices shows adequate circulation with ABI approximately 1.25 on the right with triphasic waveform as per vascular lab. Patient has a large amount a callus over the transmetatarsal amputation. There is is an ulcer approximately 10 mm in diameter and approximately 10 mm deep.. Review of the MRI scan shows no evidence of abscess no evidence of  osteomyelitis.  Assessment: Assessment: Recurrent necrotic ulceration with hypertrophic callus right transmetatarsal amputation with diabetic insensate neuropathy.  Plan: Plan: Continue IV antibiotics and discharged on oral doxycycline 100 mg twice a day. I will follow-up with the patient the office in about 10 days. Plan for debridement of callus tissue at follow-up in the office.  Thank you for the consult and the opportunity to see Mr. Tresa Garter, [place] [phone] 5:56 PM

## 2016-02-28 DIAGNOSIS — E1151 Type 2 diabetes mellitus with diabetic peripheral angiopathy without gangrene: Secondary | ICD-10-CM

## 2016-02-28 DIAGNOSIS — F1910 Other psychoactive substance abuse, uncomplicated: Secondary | ICD-10-CM

## 2016-02-28 DIAGNOSIS — N183 Chronic kidney disease, stage 3 (moderate): Secondary | ICD-10-CM

## 2016-02-28 DIAGNOSIS — L089 Local infection of the skin and subcutaneous tissue, unspecified: Secondary | ICD-10-CM

## 2016-02-28 DIAGNOSIS — K3184 Gastroparesis: Secondary | ICD-10-CM

## 2016-02-28 DIAGNOSIS — Z23 Encounter for immunization: Secondary | ICD-10-CM | POA: Diagnosis not present

## 2016-02-28 DIAGNOSIS — E1165 Type 2 diabetes mellitus with hyperglycemia: Secondary | ICD-10-CM

## 2016-02-28 DIAGNOSIS — E1169 Type 2 diabetes mellitus with other specified complication: Secondary | ICD-10-CM

## 2016-02-28 LAB — GLUCOSE, CAPILLARY: GLUCOSE-CAPILLARY: 133 mg/dL — AB (ref 65–99)

## 2016-02-28 MED ORDER — OXYCODONE-ACETAMINOPHEN 5-325 MG PO TABS: 1.0000 | ORAL_TABLET | Freq: Four times a day (QID) | ORAL | 0 refills | Status: DC | PRN

## 2016-02-28 MED ORDER — POLYETHYLENE GLYCOL 3350 17 G PO PACK: 17.0000 g | PACK | Freq: Two times a day (BID) | ORAL | 0 refills | Status: DC | PRN

## 2016-02-28 MED ORDER — GABAPENTIN 100 MG PO CAPS: 200.0000 mg | ORAL_CAPSULE | Freq: Three times a day (TID) | ORAL | 0 refills | Status: DC

## 2016-02-28 MED ORDER — DOXYCYCLINE HYCLATE 100 MG PO CAPS: 100.0000 mg | ORAL_CAPSULE | Freq: Two times a day (BID) | ORAL | 0 refills | Status: DC

## 2016-02-28 MED ORDER — METOCLOPRAMIDE HCL 5 MG PO TABS: 5.0000 mg | ORAL_TABLET | Freq: Four times a day (QID) | ORAL | 0 refills | Status: DC | PRN

## 2016-02-28 NOTE — Progress Notes (Signed)
Reviewed discharge instructions with patient, all questions answered. Patient will go down in wheelchair with all belongings to go home in private vehicle.

## 2016-02-28 NOTE — Care Management Note (Signed)
Case Management Note  Patient Details  Name: [name] MRN: [id number] Date of Birth: 04/18/1976  Subjective/Objective:                    Action/Plan:d/c home no needs or orders.   Expected Discharge Date:                  Expected Discharge Plan:  Home/Self Care  In-House Referral:     Discharge planning Services  CM Consult  Post Acute Care Choice:    Choice offered to:  NA  DME Arranged:    DME Agency:     HH Arranged:    HH Agency:     Status of Service:  Completed, signed off  If discussed at [employer] Length of Stay Meetings, dates discussed:    Additional Comments:  Lanier ClamMahabir, Emily Forse, RN 02/28/2016, 10:53 AM

## 2016-03-01 ENCOUNTER — Encounter (HOSPITAL_COMMUNITY): Payer: Self-pay | Admitting: Emergency Medicine

## 2016-03-01 ENCOUNTER — Emergency Department (HOSPITAL_COMMUNITY)
Admission: EM | Admit: 2016-03-01 | Discharge: 2016-03-01 | Disposition: A | Discharge status: Home / Self Care | Payer: PPO | Attending: Emergency Medicine | Admitting: Emergency Medicine

## 2016-03-01 DIAGNOSIS — E1143 Type 2 diabetes mellitus with diabetic autonomic (poly)neuropathy: Secondary | ICD-10-CM | POA: Insufficient documentation

## 2016-03-01 DIAGNOSIS — I129 Hypertensive chronic kidney disease with stage 1 through stage 4 chronic kidney disease, or unspecified chronic kidney disease: Secondary | ICD-10-CM | POA: Diagnosis not present

## 2016-03-01 DIAGNOSIS — Z794 Long term (current) use of insulin: Secondary | ICD-10-CM | POA: Diagnosis not present

## 2016-03-01 DIAGNOSIS — K3184 Gastroparesis: Secondary | ICD-10-CM

## 2016-03-01 DIAGNOSIS — N183 Chronic kidney disease, stage 3 (moderate): Secondary | ICD-10-CM | POA: Diagnosis not present

## 2016-03-01 DIAGNOSIS — Z79899 Other long term (current) drug therapy: Secondary | ICD-10-CM | POA: Insufficient documentation

## 2016-03-01 DIAGNOSIS — F17210 Nicotine dependence, cigarettes, uncomplicated: Secondary | ICD-10-CM | POA: Insufficient documentation

## 2016-03-01 DIAGNOSIS — R1011 Right upper quadrant pain: Secondary | ICD-10-CM | POA: Diagnosis not present

## 2016-03-01 DIAGNOSIS — E1122 Type 2 diabetes mellitus with diabetic chronic kidney disease: Secondary | ICD-10-CM | POA: Diagnosis not present

## 2016-03-01 LAB — BASIC METABOLIC PANEL
ANION GAP: 8 (ref 5–15)
BUN: 19 mg/dL (ref 6–20)
CALCIUM: 9.5 mg/dL (ref 8.9–10.3)
CO2: 28 mmol/L (ref 22–32)
CREATININE: 1.66 mg/dL — AB (ref 0.61–1.24)
Chloride: 102 mmol/L (ref 101–111)
GFR, EST AFRICAN AMERICAN: 59 mL/min — AB (ref >60)
GFR, EST NON AFRICAN AMERICAN: 50 mL/min — AB (ref >60)
Glucose, Bld: 203 mg/dL — ABNORMAL HIGH (ref 65–99)
Potassium: 3.8 mmol/L (ref 3.5–5.1)
SODIUM: 138 mmol/L (ref 135–145)

## 2016-03-01 LAB — CBC WITH DIFFERENTIAL/PLATELET
BASOS ABS: 0 10*3/uL (ref 0.0–0.1)
BASOS PCT: 0 %
EOS ABS: 0.1 10*3/uL (ref 0.0–0.7)
Eosinophils Relative: 1 %
HCT: 40.5 % (ref 39.0–52.0)
HEMOGLOBIN: 13.7 g/dL (ref 13.0–17.0)
Lymphocytes Relative: 12 %
Lymphs Abs: 1.2 10*3/uL (ref 0.7–4.0)
MCH: 30.6 pg (ref 26.0–34.0)
MCHC: 33.8 g/dL (ref 30.0–36.0)
MCV: 90.6 fL (ref 78.0–100.0)
Monocytes Absolute: 0.7 10*3/uL (ref 0.1–1.0)
Monocytes Relative: 7 %
NEUTROS PCT: 80 %
Neutro Abs: 8.1 10*3/uL — ABNORMAL HIGH (ref 1.7–7.7)
Platelets: 225 10*3/uL (ref 150–400)
RBC: 4.47 MIL/uL (ref 4.22–5.81)
RDW: 13.7 % (ref 11.5–15.5)
WBC: 10.1 10*3/uL (ref 4.0–10.5)

## 2016-03-01 LAB — HEPATIC FUNCTION PANEL
ALBUMIN: 4.1 g/dL (ref 3.5–5.0)
ALK PHOS: 76 U/L (ref 38–126)
ALT: 25 U/L (ref 17–63)
AST: 25 U/L (ref 15–41)
Bilirubin, Direct: 0.1 mg/dL (ref 0.1–0.5)
Indirect Bilirubin: 0.7 mg/dL (ref 0.3–0.9)
TOTAL PROTEIN: 8.7 g/dL — AB (ref 6.5–8.1)
Total Bilirubin: 0.8 mg/dL (ref 0.3–1.2)

## 2016-03-01 LAB — LIPASE, BLOOD: LIPASE: 23 U/L (ref 11–51)

## 2016-03-01 MED ORDER — METOCLOPRAMIDE HCL 5 MG/ML IJ SOLN
10.0000 mg | Freq: Once | INTRAMUSCULAR | Status: AC
  Administered 2016-03-01: 10 mg via INTRAVENOUS
  Filled 2016-03-01: qty 2

## 2016-03-01 MED ORDER — SODIUM CHLORIDE 0.9 % IV BOLUS (SEPSIS)
500.0000 mL | Freq: Once | INTRAVENOUS | Status: AC
  Administered 2016-03-01: 500 mL via INTRAVENOUS

## 2016-03-01 MED ORDER — HYDROMORPHONE HCL 1 MG/ML IJ SOLN
1.0000 mg | Freq: Once | INTRAMUSCULAR | Status: AC
  Administered 2016-03-01: 1 mg via INTRAVENOUS
  Filled 2016-03-01: qty 1

## 2016-03-01 MED ORDER — METOCLOPRAMIDE HCL 10 MG PO TABS: 10.0000 mg | ORAL_TABLET | Freq: Four times a day (QID) | ORAL | 0 refills | Status: DC | PRN

## 2016-03-01 MED ORDER — ONDANSETRON HCL 4 MG/2ML IJ SOLN
4.0000 mg | Freq: Once | INTRAMUSCULAR | Status: AC
  Administered 2016-03-01: 4 mg via INTRAVENOUS
  Filled 2016-03-01: qty 2

## 2016-03-01 NOTE — ED Provider Notes (Addendum)
[facility] DEPT Provider Note   CSN: [id number] Arrival date & time: 03/01/16  5364     History   Chief Complaint Chief Complaint  Patient presents with  . Emesis  . Abdominal Pain    HPI [name] is a 40 y.o. male.  He presents for evaluation of nausea and vomiting for several days and right upper quadrant abdominal pain. He is being treated for a foot infection at this time. He has been unable to keep his blood pressure medicines down. He denies nausea, vomiting, chest pain, paresthesias or focal weakness. There are no other known modifying factors.  HPI  Past Medical History:  Diagnosis Date  . Acute osteomyelitis, ankle and foot 07/30/2010   Qualifier: Diagnosis of  By: Tommy Medal MD, Roderic Scarce    . Anemia   . Chronic kidney disease (CKD), stage III (moderate)   . Collagen vascular disease ([place])   . DDD (degenerative disc disease), lumbar   . Gastroparesis   . GERD (gastroesophageal reflux disease)   . Hiatal hernia   . Hyperlipidemia   . Hypertension   . Pancreatitis   . Peripheral vascular disease ([place])   . Renal insufficiency   . Type II diabetes mellitus ([place]) dx'd ~ 1996  . Vascular disease    poor circulation to left foot    Patient Active Problem List   Diagnosis Date Noted  . Diabetic infection of right foot ([place]) 02/27/2016  . Diabetic foot infection ([place]) 02/27/2016  . Tobacco abuse 02/27/2016  . Gastroparesis 05/28/2015  . GERD (gastroesophageal reflux disease) 10/01/2014  . Right foot infection 10/01/2014  . Osteomyelitis of foot, right, acute ([place]) 10/01/2014  . Diabetic foot ulcer ([place]) 10/01/2014  . Esophageal reflux   . Fever 09/27/2014  . Abdominal pain, lower 09/27/2014  . Abnormal ECG 05/30/2014  . Chest pain 05/30/2014  . Ankle pain 05/30/2014  . Pain in the chest   . Nausea 08/30/2013  . Epigastric abdominal pain 08/30/2013  . Low grade fever 08/30/2013  . Diabetic foot ulcer with osteomyelitis ([place]) 05/12/2013  .  Osteomyelitis ([place]) 05/12/2013  . Diabetic osteomyelitis b/l toes 03/23/2013  . DM (diabetes mellitus) type II uncontrolled, periph vascular disorder ([place]) 03/23/2013  . CKD (chronic kidney disease), stage III 03/23/2013  . Anemia 03/23/2013  . Essential hypertension, benign 03/23/2013  . Diabetic foot ulcers ([place]) 03/22/2013  . AKI (acute kidney injury) ([place]) 03/22/2013  . Viral gastroenteritis 09/17/2012  . Nausea & vomiting 09/16/2012  . Acute pancreatitis 09/16/2012  . Diabetes mellitus ([place]) 09/20/2010  . Onychomycosis 09/20/2010  . METHICILLIN SUSCEPTIBLE STAPH AUREUS SEPTICEMIA 07/30/2010  . ACUTE OSTEOMYELITIS, ANKLE AND FOOT 07/30/2010    Past Surgical History:  Procedure Laterality Date  . AMPUTATION  04/25/2011   Procedure: AMPUTATION DIGIT;  Surgeon: Newt Minion, MD;  Location: [facility];  Service: Orthopedics;  Laterality: Left;  Left foot 3rd toe amputation MTP joint, Gastroc Recession  Achilles Lengthening   . AMPUTATION Bilateral 03/25/2013   Procedure: AMPUTATION RAY;  Surgeon: Newt Minion, MD;  Location: [facility];  Service: Orthopedics;  Laterality: Bilateral;  Left Great Toe Amputation at  MTP Joint, Right 1st and 2nd Ray Amputation   . AMPUTATION Right 10/03/2014   Procedure: AMPUTATION MIDFOOT;  Surgeon: Newt Minion, MD;  Location: [facility];  Service: Orthopedics;  Laterality: Right;  . LAPAROSCOPIC CHOLECYSTECTOMY    . TOE AMPUTATION  2012   left foot; great toe and second toe  Home Medications    Prior to Admission medications   Medication Sig Start Date End Date Taking? Authorizing Provider  blood glucose meter kit and supplies KIT Dispense based on patient and insurance preference. Use to check blood sugar two times daily as directed. (FOR ICD-9 250.00, 250.01). 09/30/14  Yes Barton Dubois, MD  doxycycline (VIBRAMYCIN) 100 MG capsule Take 1 capsule (100 mg total) by mouth 2 (two) times daily. 02/28/16  Yes Debbe Odea, MD  gabapentin (NEURONTIN) 100 MG  capsule Take 2 capsules (200 mg total) by mouth 3 (three) times daily. 02/28/16  Yes Debbe Odea, MD  insulin aspart (NOVOLOG) 100 UNIT/ML injection Inject 3 Units into the skin 3 (three) times daily with meals. 10/05/14  Yes Belkys A Regalado, MD  insulin glargine (LANTUS) 100 UNIT/ML injection Inject 0.15 mLs (15 Units total) into the skin at bedtime. 10/05/14  Yes Belkys A Regalado, MD  Insulin Pen Needle 31G X 5 MM MISC Use one needle to inject insulin with flexpen as indicated twice a day 09/30/14  Yes Barton Dubois, MD  lisinopril (PRINIVIL,ZESTRIL) 10 MG tablet Take 10 mg by mouth every morning.    Yes Historical Provider, MD  Multiple Vitamin (MULTIVITAMIN WITH MINERALS) TABS tablet Take 1 tablet by mouth every morning.    Yes Historical Provider, MD  oxyCODONE-acetaminophen (PERCOCET/ROXICET) 5-325 MG tablet Take 1 tablet by mouth every 6 (six) hours as needed for severe pain. 02/28/16  Yes Debbe Odea, MD  polyethylene glycol (MIRALAX) packet Take 17 g by mouth 2 (two) times daily as needed. Patient taking differently: Take 17 g by mouth 2 (two) times daily as needed for mild constipation or moderate constipation.  02/28/16  Yes Debbe Odea, MD  metoCLOPramide (REGLAN) 10 MG tablet Take 1 tablet (10 mg total) by mouth every 6 (six) hours as needed for nausea or vomiting. 03/01/16   Daleen Bo, MD    Family History Family History  Problem Relation Age of Onset  . Heart attack Father 51  . Hypertension Sister     Social History Social History  Substance Use Topics  . Smoking status: Current Every Day Smoker    Packs/day: 0.10    Years: 4.00    Types: Cigarettes  . Smokeless tobacco: Never Used  . Alcohol use No     Allergies   Review of patient's allergies indicates no known allergies.   Review of Systems Review of Systems  All other systems reviewed and are negative.    Physical Exam Updated Vital Signs BP (!) 151/105 (BP Location: Left Arm)   Pulse 85   Temp 98.2  F (36.8 C) (Oral)   Resp 16   SpO2 99%   Physical Exam  Constitutional: He is oriented to person, place, and time. He appears well-developed and well-nourished.  HENT:  Head: Normocephalic and atraumatic.  Right Ear: External ear normal.  Left Ear: External ear normal.  Eyes: Conjunctivae and EOM are normal. Pupils are equal, round, and reactive to light.  Neck: Normal range of motion and phonation normal. Neck supple.  Cardiovascular: Normal rate, regular rhythm and normal heart sounds.   Pulmonary/Chest: Effort normal and breath sounds normal. He exhibits no bony tenderness.  Abdominal: Soft. There is tenderness (Diffuse, mild).  Musculoskeletal: Normal range of motion.  Neurological: He is alert and oriented to person, place, and time. No cranial nerve deficit or sensory deficit. He exhibits normal muscle tone. Coordination normal.  Skin: Skin is warm, dry and intact.  Psychiatric: He has  a normal mood and affect. His behavior is normal. Judgment and thought content normal.  Nursing note and vitals reviewed.    ED Treatments / Results  Labs (all labs ordered are listed, but only abnormal results are displayed) Labs Reviewed  BASIC METABOLIC PANEL - Abnormal; Notable for the following:       Result Value   Glucose, Bld 203 (*)    Creatinine, Ser 1.66 (*)    GFR calc non Af Amer 50 (*)    GFR calc Af Amer 59 (*)    All other components within normal limits  CBC WITH DIFFERENTIAL/PLATELET - Abnormal; Notable for the following:    Neutro Abs 8.1 (*)    All other components within normal limits  HEPATIC FUNCTION PANEL - Abnormal; Notable for the following:    Total Protein 8.7 (*)    All other components within normal limits  LIPASE, BLOOD    EKG  EKG Interpretation None       Radiology No results found.  Procedures Procedures (including critical care time)  Medications Ordered in ED Medications  ondansetron (ZOFRAN) injection 4 mg (4 mg Intravenous Given  03/01/16 1057)  sodium chloride 0.9 % bolus 500 mL (500 mLs Intravenous New Bag/Given 03/01/16 1057)  HYDROmorphone (DILAUDID) injection 1 mg (1 mg Intravenous Given 03/01/16 1057)  metoCLOPramide (REGLAN) injection 10 mg (10 mg Intravenous Given 03/01/16 1312)  HYDROmorphone (DILAUDID) injection 1 mg (1 mg Intravenous Given 03/01/16 1312)     Initial Impression / Assessment and Plan / ED Course  I have reviewed the triage vital signs and the nursing notes.  Pertinent labs & imaging results that were available during my care of the patient were reviewed by me and considered in my medical decision making (see chart for details).  Clinical Course  Comment By Time  Patient states that his pain is "coming back, and I am nauseated, and vomiting a little". Daleen Bo, MD 09/23 1258    Medications  ondansetron (ZOFRAN) injection 4 mg (4 mg Intravenous Given 03/01/16 1057)  sodium chloride 0.9 % bolus 500 mL (500 mLs Intravenous New Bag/Given 03/01/16 1057)  HYDROmorphone (DILAUDID) injection 1 mg (1 mg Intravenous Given 03/01/16 1057)  metoCLOPramide (REGLAN) injection 10 mg (10 mg Intravenous Given 03/01/16 1312)  HYDROmorphone (DILAUDID) injection 1 mg (1 mg Intravenous Given 03/01/16 1312)    Patient Vitals for the past 24 hrs:  BP Temp Temp src Pulse Resp SpO2  03/01/16 1427 115/68 98.4 F (36.9 C) Oral 79 16 98 %  03/01/16 1313 (!) 151/105 98.2 F (36.8 C) Oral 85 16 99 %  03/01/16 1102 182/99 - - 67 16 100 %  03/01/16 0950 (!) 192/102 98.2 F (36.8 C) - 69 18 100 %    2:27 PM Reevaluation with update and discussion. After initial assessment and treatment, an updated evaluation reveals He is now comfortable and feels like he Can go home. Findings discussed with patient and all questions answered. Laster Appling L    Final Clinical Impressions(s) / ED Diagnoses   Final diagnoses:  Gastroparesis due to DM (HCC)   Gastroparesis, and a diabetic patient. Possible aggravation secondary  to use of antibiotic treatment for right foot infection. Doubt metabolic instability or serious bacterial infection.  Nursing Notes Reviewed/ Care Coordinated Applicable Imaging Reviewed Interpretation of Laboratory Data incorporated into ED treatment  The patient appears reasonably screened and/or stabilized for discharge and I doubt any other medical condition or other [place] requiring further screening, evaluation, or treatment  in the ED at this time prior to discharge.  Plan: Home Medications- continue; Home Treatments- rest, gradually advance diet; return here if the recommended treatment, does not improve the symptoms; Recommended follow up- PCP 1 week and prn    New Prescriptions New Prescriptions   METOCLOPRAMIDE (REGLAN) 10 MG TABLET    Take 1 tablet (10 mg total) by mouth every 6 (six) hours as needed for nausea or vomiting.     Daleen Bo, MD 03/01/16 [place], MD 03/07/16 2133

## 2016-03-01 NOTE — ED Triage Notes (Signed)
Pt presents to ED with nausea, vomiting, dizziness and RUQ abdominal pain x 2-3 days.  Pt says symptoms began after he left the hospital where he was treated for an infection in his foot. Pt denies SHOB or diarrhea.  Pt states he is vomiting red and that it looks like blood. Pt also states he takes BP medicines daily but has not been able to keep anything down.

## 2016-03-03 LAB — CULTURE, BLOOD (ROUTINE X 2)
CULTURE: NO GROWTH
Culture: NO GROWTH

## 2016-03-04 ENCOUNTER — Emergency Department (HOSPITAL_COMMUNITY): Payer: PPO

## 2016-03-04 ENCOUNTER — Emergency Department (HOSPITAL_COMMUNITY)
Admission: EM | Admit: 2016-03-04 | Discharge: 2016-03-04 | Disposition: A | Discharge status: Home / Self Care | Payer: PPO | Attending: Emergency Medicine | Admitting: Emergency Medicine

## 2016-03-04 ENCOUNTER — Encounter (HOSPITAL_COMMUNITY): Payer: Self-pay

## 2016-03-04 DIAGNOSIS — K3184 Gastroparesis: Secondary | ICD-10-CM | POA: Diagnosis present

## 2016-03-04 DIAGNOSIS — R109 Unspecified abdominal pain: Secondary | ICD-10-CM | POA: Diagnosis not present

## 2016-03-04 DIAGNOSIS — E1122 Type 2 diabetes mellitus with diabetic chronic kidney disease: Secondary | ICD-10-CM | POA: Insufficient documentation

## 2016-03-04 DIAGNOSIS — I129 Hypertensive chronic kidney disease with stage 1 through stage 4 chronic kidney disease, or unspecified chronic kidney disease: Secondary | ICD-10-CM | POA: Diagnosis not present

## 2016-03-04 DIAGNOSIS — IMO0002 Reserved for concepts with insufficient information to code with codable children: Secondary | ICD-10-CM | POA: Diagnosis present

## 2016-03-04 DIAGNOSIS — Z79899 Other long term (current) drug therapy: Secondary | ICD-10-CM | POA: Insufficient documentation

## 2016-03-04 DIAGNOSIS — F17210 Nicotine dependence, cigarettes, uncomplicated: Secondary | ICD-10-CM | POA: Diagnosis not present

## 2016-03-04 DIAGNOSIS — N183 Chronic kidney disease, stage 3 unspecified: Secondary | ICD-10-CM | POA: Diagnosis present

## 2016-03-04 DIAGNOSIS — E1165 Type 2 diabetes mellitus with hyperglycemia: Secondary | ICD-10-CM

## 2016-03-04 DIAGNOSIS — F1910 Other psychoactive substance abuse, uncomplicated: Secondary | ICD-10-CM

## 2016-03-04 DIAGNOSIS — Z794 Long term (current) use of insulin: Secondary | ICD-10-CM | POA: Insufficient documentation

## 2016-03-04 DIAGNOSIS — E1151 Type 2 diabetes mellitus with diabetic peripheral angiopathy without gangrene: Secondary | ICD-10-CM | POA: Diagnosis present

## 2016-03-04 HISTORY — DX: Other psychoactive substance abuse, uncomplicated: F19.10

## 2016-03-04 LAB — BLOOD GAS, VENOUS
Acid-Base Excess: 1.8 mmol/L (ref 0.0–2.0)
BICARBONATE: 26.9 mmol/L (ref 20.0–28.0)
O2 SAT: 79.9 %
PCO2 VEN: 46.3 mmHg (ref 44.0–60.0)
PO2 VEN: 47.4 mmHg — AB (ref 32.0–45.0)
Patient temperature: 98.6
pH, Ven: 7.381 (ref 7.250–7.430)

## 2016-03-04 LAB — COMPREHENSIVE METABOLIC PANEL
ALT: 18 U/L (ref 17–63)
AST: 20 U/L (ref 15–41)
Albumin: 4 g/dL (ref 3.5–5.0)
Alkaline Phosphatase: 72 U/L (ref 38–126)
Anion gap: 7 (ref 5–15)
BUN: 21 mg/dL — ABNORMAL HIGH (ref 6–20)
CALCIUM: 9.4 mg/dL (ref 8.9–10.3)
CHLORIDE: 103 mmol/L (ref 101–111)
CO2: 28 mmol/L (ref 22–32)
CREATININE: 1.6 mg/dL — AB (ref 0.61–1.24)
GFR, EST NON AFRICAN AMERICAN: 53 mL/min — AB (ref >60)
Glucose, Bld: 211 mg/dL — ABNORMAL HIGH (ref 65–99)
Potassium: 4 mmol/L (ref 3.5–5.1)
Sodium: 138 mmol/L (ref 135–145)
Total Bilirubin: 0.3 mg/dL (ref 0.3–1.2)
Total Protein: 8.5 g/dL — ABNORMAL HIGH (ref 6.5–8.1)

## 2016-03-04 LAB — CBC
HCT: 37.3 % — ABNORMAL LOW (ref 39.0–52.0)
Hemoglobin: 12.7 g/dL — ABNORMAL LOW (ref 13.0–17.0)
MCH: 30.9 pg (ref 26.0–34.0)
MCHC: 34 g/dL (ref 30.0–36.0)
MCV: 90.8 fL (ref 78.0–100.0)
PLATELETS: 242 10*3/uL (ref 150–400)
RBC: 4.11 MIL/uL — AB (ref 4.22–5.81)
RDW: 13.5 % (ref 11.5–15.5)
WBC: 11 10*3/uL — AB (ref 4.0–10.5)

## 2016-03-04 LAB — URINE MICROSCOPIC-ADD ON

## 2016-03-04 LAB — URINALYSIS, ROUTINE W REFLEX MICROSCOPIC
BILIRUBIN URINE: NEGATIVE
Glucose, UA: 250 mg/dL — AB
KETONES UR: NEGATIVE mg/dL
Leukocytes, UA: NEGATIVE
NITRITE: NEGATIVE
Specific Gravity, Urine: 1.025 (ref 1.005–1.030)
pH: 5.5 (ref 5.0–8.0)

## 2016-03-04 LAB — LIPASE, BLOOD: LIPASE: 44 U/L (ref 11–51)

## 2016-03-04 LAB — CBG MONITORING, ED: GLUCOSE-CAPILLARY: 209 mg/dL — AB (ref 65–99)

## 2016-03-04 MED ORDER — KETOROLAC TROMETHAMINE 30 MG/ML IJ SOLN
15.0000 mg | Freq: Once | INTRAMUSCULAR | Status: AC
  Administered 2016-03-04: 15 mg via INTRAVENOUS
  Filled 2016-03-04: qty 1

## 2016-03-04 MED ORDER — METOCLOPRAMIDE HCL 5 MG/ML IJ SOLN
10.0000 mg | Freq: Once | INTRAMUSCULAR | Status: AC
  Administered 2016-03-04: 10 mg via INTRAVENOUS
  Filled 2016-03-04: qty 2

## 2016-03-04 MED ORDER — LABETALOL HCL 5 MG/ML IV SOLN
10.0000 mg | Freq: Once | INTRAVENOUS | Status: DC
  Filled 2016-03-04: qty 4

## 2016-03-04 MED ORDER — SODIUM CHLORIDE 0.9 % IV BOLUS (SEPSIS)
1000.0000 mL | Freq: Once | INTRAVENOUS | Status: AC
  Administered 2016-03-04: 1000 mL via INTRAVENOUS

## 2016-03-04 MED ORDER — HYDROMORPHONE HCL 1 MG/ML IJ SOLN
1.0000 mg | Freq: Once | INTRAMUSCULAR | Status: AC
  Administered 2016-03-04: 1 mg via INTRAVENOUS
  Filled 2016-03-04: qty 1

## 2016-03-04 MED ORDER — HALOPERIDOL LACTATE 5 MG/ML IJ SOLN
5.0000 mg | Freq: Once | INTRAMUSCULAR | Status: AC
  Administered 2016-03-04: 5 mg via INTRAVENOUS
  Filled 2016-03-04: qty 1

## 2016-03-04 MED ORDER — PROMETHAZINE HCL 25 MG/ML IJ SOLN
12.5000 mg | Freq: Once | INTRAMUSCULAR | Status: AC
  Administered 2016-03-04: 12.5 mg via INTRAVENOUS
  Filled 2016-03-04: qty 1

## 2016-03-04 NOTE — Discharge Instructions (Signed)
Follow-up with your primary care doctor, continue your current medications, return as needed for worsening symptoms °

## 2016-03-04 NOTE — ED Notes (Signed)
PT DISCHARGED. INSTRUCTIONS GIVEN. AAOX4. PT IN NO APPARENT DISTRESS OR PAIN. THE OPPORTUNITY TO ASK QUESTIONS WAS PROVIDED. 

## 2016-03-04 NOTE — ED Notes (Signed)
Patient transported to X-ray 

## 2016-03-04 NOTE — ED Triage Notes (Signed)
Pt has hx of gastroparesis.  Pt seen 3 days ago for same. Woke up this morning with abdominal pain and emesis.

## 2016-03-04 NOTE — ED Provider Notes (Signed)
[facility] DEPT Provider Note   CSN: [id number] Arrival date & time: 03/04/16  0935     History   Chief Complaint Chief Complaint  Patient presents with  . Abdominal Pain  . Emesis    HPI [name] is a 40 y.o. male.  HPI The patient presents to the emergency room with complaints of abdominal pain and vomiting. Patient has a history of gastroparesis. He feels like he is having an exacerbation. He started having nausea and vomiting about a week ago. He was having approximately 7-10 episodes per day. He was trying to manage at home with his oral medications (Reglan).  Symptoms have persisted and the patient's now feeling worse. Is also having pain in his abdomen primarily on the right side. This is typical for his gastroparesis. He denies any chills. He has felt feverish but has not measured any temperatures. No dysuria. No diarrhea. No chest pain.   Past Medical History:  Diagnosis Date  . Acute osteomyelitis, ankle and foot 07/30/2010   Qualifier: Diagnosis of  By: Tommy Medal MD, Roderic Scarce    . Anemia   . Chronic kidney disease (CKD), stage III (moderate)   . Collagen vascular disease ([place])   . DDD (degenerative disc disease), lumbar   . Gastroparesis   . GERD (gastroesophageal reflux disease)   . Hiatal hernia   . Hyperlipidemia   . Hypertension   . Pancreatitis   . Peripheral vascular disease ([place])   . Renal insufficiency   . Type II diabetes mellitus ([place]) dx'd ~ 1996  . Vascular disease    poor circulation to left foot    Patient Active Problem List   Diagnosis Date Noted  . Diabetic infection of right foot ([place]) 02/27/2016  . Diabetic foot infection ([place]) 02/27/2016  . Tobacco abuse 02/27/2016  . Gastroparesis 05/28/2015  . GERD (gastroesophageal reflux disease) 10/01/2014  . Right foot infection 10/01/2014  . Osteomyelitis of foot, right, acute ([place]) 10/01/2014  . Diabetic foot ulcer ([place]) 10/01/2014  . Esophageal reflux   . Fever 09/27/2014  .  Abdominal pain, lower 09/27/2014  . Abnormal ECG 05/30/2014  . Chest pain 05/30/2014  . Ankle pain 05/30/2014  . Pain in the chest   . Nausea 08/30/2013  . Epigastric abdominal pain 08/30/2013  . Low grade fever 08/30/2013  . Diabetic foot ulcer with osteomyelitis ([place]) 05/12/2013  . Osteomyelitis ([place]) 05/12/2013  . Diabetic osteomyelitis b/l toes 03/23/2013  . DM (diabetes mellitus) type II uncontrolled, periph vascular disorder ([place]) 03/23/2013  . CKD (chronic kidney disease), stage III 03/23/2013  . Anemia 03/23/2013  . Essential hypertension, benign 03/23/2013  . Diabetic foot ulcers ([place]) 03/22/2013  . AKI (acute kidney injury) ([place]) 03/22/2013  . Viral gastroenteritis 09/17/2012  . Nausea & vomiting 09/16/2012  . Acute pancreatitis 09/16/2012  . Diabetes mellitus ([place]) 09/20/2010  . Onychomycosis 09/20/2010  . METHICILLIN SUSCEPTIBLE STAPH AUREUS SEPTICEMIA 07/30/2010  . ACUTE OSTEOMYELITIS, ANKLE AND FOOT 07/30/2010    Past Surgical History:  Procedure Laterality Date  . AMPUTATION  04/25/2011   Procedure: AMPUTATION DIGIT;  Surgeon: Newt Minion, MD;  Location: [facility];  Service: Orthopedics;  Laterality: Left;  Left foot 3rd toe amputation MTP joint, Gastroc Recession  Achilles Lengthening   . AMPUTATION Bilateral 03/25/2013   Procedure: AMPUTATION RAY;  Surgeon: Newt Minion, MD;  Location: [facility];  Service: Orthopedics;  Laterality: Bilateral;  Left Great Toe Amputation at  MTP Joint, Right 1st and 2nd Ray Amputation   .  AMPUTATION Right 10/03/2014   Procedure: AMPUTATION MIDFOOT;  Surgeon: Newt Minion, MD;  Location: [facility];  Service: Orthopedics;  Laterality: Right;  . LAPAROSCOPIC CHOLECYSTECTOMY    . TOE AMPUTATION  2012   left foot; great toe and second toe       Home Medications    Prior to Admission medications   Medication Sig Start Date End Date Taking? Authorizing Provider  blood glucose meter kit and supplies KIT Dispense based on patient and  insurance preference. Use to check blood sugar two times daily as directed. (FOR ICD-9 250.00, 250.01). 09/30/14  Yes Barton Dubois, MD  doxycycline (VIBRAMYCIN) 100 MG capsule Take 1 capsule (100 mg total) by mouth 2 (two) times daily. Patient taking differently: Take 100 mg by mouth 2 (two) times daily. Started 09/23 for 14 days 02/28/16  Yes Debbe Odea, MD  gabapentin (NEURONTIN) 100 MG capsule Take 2 capsules (200 mg total) by mouth 3 (three) times daily. 02/28/16  Yes Debbe Odea, MD  insulin aspart (NOVOLOG) 100 UNIT/ML injection Inject 3 Units into the skin 3 (three) times daily with meals. 10/05/14  Yes Belkys A Regalado, MD  insulin glargine (LANTUS) 100 UNIT/ML injection Inject 0.15 mLs (15 Units total) into the skin at bedtime. 10/05/14  Yes Belkys A Regalado, MD  Insulin Pen Needle 31G X 5 MM MISC Use one needle to inject insulin with flexpen as indicated twice a day 09/30/14  Yes Barton Dubois, MD  lisinopril (PRINIVIL,ZESTRIL) 10 MG tablet Take 10 mg by mouth every morning.    Yes Historical Provider, MD  metoCLOPramide (REGLAN) 10 MG tablet Take 1 tablet (10 mg total) by mouth every 6 (six) hours as needed for nausea or vomiting. 03/01/16  Yes Daleen Bo, MD  Multiple Vitamin (MULTIVITAMIN WITH MINERALS) TABS tablet Take 1 tablet by mouth every morning.    Yes Historical Provider, MD  oxyCODONE-acetaminophen (PERCOCET/ROXICET) 5-325 MG tablet Take 1 tablet by mouth every 6 (six) hours as needed for severe pain. 02/28/16  Yes Debbe Odea, MD  polyethylene glycol (MIRALAX) packet Take 17 g by mouth 2 (two) times daily as needed. Patient taking differently: Take 17 g by mouth 2 (two) times daily as needed for mild constipation or moderate constipation.  02/28/16  Yes Debbe Odea, MD    Family History Family History  Problem Relation Age of Onset  . Heart attack Father 54  . Hypertension Sister     Social History Social History  Substance Use Topics  . Smoking status: Current Every  Day Smoker    Packs/day: 0.10    Years: 4.00    Types: Cigarettes  . Smokeless tobacco: Never Used  . Alcohol use No     Allergies   Review of patient's allergies indicates no known allergies.   Review of Systems Review of Systems  All other systems reviewed and are negative.    Physical Exam Updated Vital Signs BP 131/87 (BP Location: Left Arm)   Pulse 86   Temp 98.3 F (36.8 C) (Oral)   Resp 18   Ht '6\' 1"'$  (1.854 m)   Wt 106.6 kg   SpO2 91%   BMI 31.00 kg/m   Physical Exam  Constitutional: He appears well-developed and well-nourished. He appears distressed.  HENT:  Head: Normocephalic and atraumatic.  Right Ear: External ear normal.  Left Ear: External ear normal.  Eyes: Conjunctivae are normal. Right eye exhibits no discharge. Left eye exhibits no discharge. No scleral icterus.  Neck: Neck supple. No  tracheal deviation present.  Cardiovascular: Normal rate, regular rhythm and intact distal pulses.   Pulmonary/Chest: Effort normal and breath sounds normal. No stridor. No respiratory distress. He has no wheezes. He has no rales.  Abdominal: Soft. Bowel sounds are normal. He exhibits no distension. There is tenderness in the epigastric area. There is guarding. No hernia.  Musculoskeletal: He exhibits no edema or tenderness.  Neurological: He is alert. He has normal strength. No cranial nerve deficit (no facial droop, extraocular movements intact, no slurred speech) or sensory deficit. He exhibits normal muscle tone. He displays no seizure activity. Coordination normal.  Skin: Skin is warm and dry. No rash noted.  Psychiatric: He has a normal mood and affect.  Nursing note and vitals reviewed.  patient is hypertensive   ED Treatments / Results  Labs (all labs ordered are listed, but only abnormal results are displayed) Labs Reviewed  COMPREHENSIVE METABOLIC PANEL - Abnormal; Notable for the following:       Result Value   Glucose, Bld 211 (*)    BUN 21 (*)     Creatinine, Ser 1.60 (*)    Total Protein 8.5 (*)    GFR calc non Af Amer 53 (*)    All other components within normal limits  CBC - Abnormal; Notable for the following:    WBC 11.0 (*)    RBC 4.11 (*)    Hemoglobin 12.7 (*)    HCT 37.3 (*)    All other components within normal limits  URINALYSIS, ROUTINE W REFLEX MICROSCOPIC (NOT AT [facility]) - Abnormal; Notable for the following:    Glucose, UA 250 (*)    Hgb urine dipstick TRACE (*)    Protein, ur >300 (*)    All other components within normal limits  URINE MICROSCOPIC-ADD ON - Abnormal; Notable for the following:    Squamous Epithelial / LPF 0-5 (*)    Bacteria, UA FEW (*)    All other components within normal limits  BLOOD GAS, VENOUS - Abnormal; Notable for the following:    pO2, Ven 47.4 (*)    All other components within normal limits  CBG MONITORING, ED - Abnormal; Notable for the following:    Glucose-Capillary 209 (*)    All other components within normal limits  LIPASE, BLOOD    EKG  EKG Interpretation  Date/Time:  Tuesday March 04 2016 11:26:07 EDT Ventricular Rate:  74 PR Interval:    QRS Duration: 91 QT Interval:  375 QTC Calculation: 416 R Axis:   -23 Text Interpretation:  Normal sinus rhythm Consider left ventricular hypertrophy l  early repol pattern --noted on prior Confirmed by Jeneen Rinks  MD, [place] ([id number]) on 03/04/2016 11:41:00 AM       Radiology Dg Abdomen Acute W/chest  Result Date: 03/04/2016 CLINICAL DATA:  Nausea, vomiting and right-sided abdominal pain for 1 week. EXAM: DG ABDOMEN ACUTE W/ 1V CHEST COMPARISON:  CT scan 05/31/2015 FINDINGS: The cardiac silhouette, mediastinal and hilar contours are nor and stable. The lungs are clear. Two views of the abdomen demonstrate a normal bowel gas pattern. Scattered air and stool in the colon and down into the rectum. No distended small bowel loops to suggest obstruction. The soft tissue shadows are maintained. No worrisome calcifications. The bony structures  are normal. IMPRESSION: Normal acute abdominal series. Electronically Signed   By: Marijo Sanes [name]   On: 03/04/2016 11:42    Procedures Procedures (including critical care time)  Medications Ordered in ED Medications  labetalol (NORMODYNE,TRANDATE)  injection 10 mg (0 mg Intravenous Hold 03/04/16 1158)  sodium chloride 0.9 % bolus 1,000 mL (0 mLs Intravenous Stopped 03/04/16 1245)  haloperidol lactate (HALDOL) injection 5 mg (5 mg Intravenous Given 03/04/16 1118)  promethazine (PHENERGAN) injection 12.5 mg (12.5 mg Intravenous Given 03/04/16 1158)  metoCLOPramide (REGLAN) injection 10 mg (10 mg Intravenous Given 03/04/16 1247)  ketorolac (TORADOL) 30 MG/ML injection 15 mg (15 mg Intravenous Given 03/04/16 1247)  HYDROmorphone (DILAUDID) injection 1 mg (1 mg Intravenous Given 03/04/16 1247)     Initial Impression / Assessment and Plan / ED Course  I have reviewed the triage vital signs and the nursing notes.  Pertinent labs & imaging results that were available during my care of the patient were reviewed by me and considered in my medical decision making (see chart for details).  Clinical Course  Comment By Time  Labs without significant abnormalities.  No acidosis.  HGB stable.  Increased WBC count.  No obstruction on aas Dorie Rank, MD 09/26 1207  No improvement with the haldol Dorie Rank, MD 09/26 1219  Pt is feeling better.   Will try oral fluid trial  Dorie Rank, MD 09/26 1415   Pt tolerated oral fluids.  Ready to go home.  1500  Final Clinical Impressions(s) / ED Diagnoses   Final diagnoses:  Gastroparesis    New Prescriptions New Prescriptions   No medications on file     Dorie Rank, MD 03/04/16 1504

## 2016-03-08 ENCOUNTER — Encounter (HOSPITAL_COMMUNITY): Payer: Self-pay | Admitting: Internal Medicine

## 2016-03-08 DIAGNOSIS — F1910 Other psychoactive substance abuse, uncomplicated: Secondary | ICD-10-CM

## 2016-03-08 HISTORY — DX: Other psychoactive substance abuse, uncomplicated: F19.10

## 2016-03-08 NOTE — Discharge Summary (Signed)
Physician Discharge Summary  [name] FXT:[id number] DOB: 1975-06-25 DOA: 03/04/2016  PCP: Curtis Fendt, MD  Admit date: 03/04/2016 Discharge date: 03/08/2016  Admitted From: home  Disposition:  home    Discharge Condition:  stable   CODE STATUS:  Full code  Diet recommendation:  diabetic Consultations:  Ortho- Dr [name]   Discharge Diagnoses:  Principal Problem:   Diabetic foot infection ([place]) Active Problems:   DM (diabetes mellitus) type II uncontrolled, periph vascular disorder ([place])   CKD (chronic kidney disease), stage III   Gastroparesis   Polysubstance abuse     Brief Summary: 40 y/o with medical history significant of hypertension, hyperlipidemia, diabetes mellitus, PVD, pancreatitis, gastroparesis, marijuana abuse, tobacco abuse, GERD, CKD-III, osteomyelitis of right foot s/p right transmetatarsalamputation, whopresents with right foot ulcer at the site of a callus with drainage from the ulcer and pain.  Hospital Course:  Diabetic infection of right foot - ulceration of callus - appreciate ortho eval - MRI negative for osteomyelitis or abscess - Dr [name] recommends BID Doxycycline and outpt f/u in 10 days  Active Problems:   DM (diabetes mellitus) type II uncontrolled, periph vascular disorder  -cont Lantus and Novolog- per patient, sugars usually 100-200 range    CKD (chronic kidney disease), stage III - stable    Essential hypertension, benign - Lisinopril  Substance abuse - Cocaine, Marijuana, Tobacco- advised to discontinue  - HIV nonreactive    Discharge Instructions     Medication List    blood glucose meter kit and supplies Kit Dispense based on patient and insurance preference. Use to check blood sugar two times daily as directed. (FOR ICD-9 250.00, 250.01).   doxycycline 100 MG capsule Commonly known as:  VIBRAMYCIN Take 1 capsule (100 mg total) by mouth 2 (two) times daily.   gabapentin 100 MG capsule Commonly known  as:  NEURONTIN Take 2 capsules (200 mg total) by mouth 3 (three) times daily.   insulin aspart 100 UNIT/ML injection Commonly known as:  novoLOG Inject 3 Units into the skin 3 (three) times daily with meals.   insulin glargine 100 UNIT/ML injection Commonly known as:  LANTUS Inject 0.15 mLs (15 Units total) into the skin at bedtime.   Insulin Pen Needle 31G X 5 MM Misc Use one needle to inject insulin with flexpen as indicated twice a day   lisinopril 10 MG tablet Commonly known as:  PRINIVIL,ZESTRIL Take 10 mg by mouth every morning.   metoCLOPramide 10 MG tablet Commonly known as:  REGLAN Take 1 tablet (10 mg total) by mouth every 6 (six) hours as needed for nausea or vomiting.   multivitamin with minerals Tabs tablet Take 1 tablet by mouth every morning.   oxyCODONE-acetaminophen 5-325 MG tablet Commonly known as:  PERCOCET/ROXICET Take 1 tablet by mouth every 6 (six) hours as needed for severe pain.   polyethylene glycol packet Commonly known as:  MIRALAX Take 17 g by mouth 2 (two) times daily as needed.      Follow-up Information    Curtis Fendt, MD In 1 week.   Specialty:  Internal Medicine Contact information: [address] [place] [address] [phone]          No Known Allergies   Procedures/Studies:   Mr Foot Right Wo Contrast  Result Date: 02/27/2016 CLINICAL DATA:  Large open wound on the plantar surface of the right foot. History of prior first ray amputation. History of prior amputation at the level of the mid second through fifth  metatarsals. EXAM: MRI OF THE RIGHT FOREFOOT WITHOUT CONTRAST TECHNIQUE: Multiplanar, multisequence MR imaging was performed. No intravenous contrast was administered. COMPARISON:  Plain films of the right foot 02/27/2016 and 12/31/2015 FINDINGS: A soft tissue wound is seen at the patient's stump. No underlying abscess is identified. No bone marrow signal to suggest osteomyelitis is seen. The patient has  degenerative change of the second and third tarsometatarsal joints, worst at the second. Marrow edema extending into the mid diaphysis of the second metatarsal is most consistent with stress change. There is no evidence of septic joint. Intrinsic musculature the foot is atrophied. IMPRESSION: Negative for abscess or osteomyelitis with a skin ulceration at the patient's stump noted. Second worse than third tarsometatarsal osteoarthritis. Marrow edema in the proximal 1/2 of the second metatarsal is consistent with stress change related to degenerative disease. Electronically Signed   By: Inge Rise [name]   On: 02/27/2016 14:46   Dg Abdomen Acute W/chest  Result Date: 03/04/2016 CLINICAL DATA:  Nausea, vomiting and right-sided abdominal pain for 1 week. EXAM: DG ABDOMEN ACUTE W/ 1V CHEST COMPARISON:  CT scan 05/31/2015 FINDINGS: The cardiac silhouette, mediastinal and hilar contours are nor and stable. The lungs are clear. Two views of the abdomen demonstrate a normal bowel gas pattern. Scattered air and stool in the colon and down into the rectum. No distended small bowel loops to suggest obstruction. The soft tissue shadows are maintained. No worrisome calcifications. The bony structures are normal. IMPRESSION: Normal acute abdominal series. Electronically Signed   By: Marijo Sanes [name]   On: 03/04/2016 11:42   Dg Foot Complete Right  Result Date: 02/27/2016 CLINICAL DATA:  Diabetic ulceration along the plantar aspect of the right foot, acute onset. Initial encounter. EXAM: RIGHT FOOT COMPLETE - 3+ VIEW COMPARISON:  Right foot radiographs performed 12/31/2015 FINDINGS: There is chronic amputation involving all of the second through fifth distal metatarsals, and also the base of the first metatarsal. No definite focal osseous erosion is seen to suggest osteomyelitis, though evaluation for osteomyelitis is limited on radiograph. A small posterior calcaneal spur is noted. An os peroneum is seen. Soft  tissue swelling is noted at the forefoot, with an associated large soft tissue defect. No definite abnormal soft tissue air is seen. IMPRESSION: 1. Large soft tissue defect at the forefoot. No abnormal soft tissue air seen. No definite focal osseous erosion seen to suggest osteomyelitis, though evaluation for osteomyelitis is limited on radiograph. 2. Underlying chronic amputation involving the base of the first metatarsal, and the second through fifth distal metatarsals. 3. Os peroneum noted. Electronically Signed   By: Garald Balding [name]   On: 02/27/2016 03:39       Discharge Exam: Vitals:   03/04/16 1200 03/04/16 1409  BP: 141/81 131/87  Pulse: 78 86  Resp: 17 18  Temp:     Vitals:   03/04/16 1159 03/04/16 1200 03/04/16 1255 03/04/16 1409  BP: 133/74 141/81  131/87  Pulse: 80 78  86  Resp: _0 Temp:      TempSrc:      SpO2: 100% 100%  91%  Weight:   106.6 kg (235 lb)   Height:   _1  (1.854 m)     General: Pt is alert, awake, not in acute distress Cardiovascular: RRR, S1/S2 +, no rubs, no gallops Respiratory: CTA bilaterally, no wheezing, no rhonchi Abdominal: Soft, NT, ND, bowel sounds + Extremities: no edema, no cyanosis    The results of  significant diagnostics from this hospitalization (including imaging, microbiology, ancillary and laboratory) are listed below for reference.     Microbiology: No results found for this or any previous visit (from the past 240 hour(s)).   Labs: BNP (last 3 results) No results for input(s): BNP in the last 8760 hours. Basic Metabolic Panel:  Recent Labs Lab 03/04/16 1003  NA 138  K 4.0  CL 103  CO2 28  GLUCOSE 211*  BUN 21*  CREATININE 1.60*  CALCIUM 9.4   Liver Function Tests:  Recent Labs Lab 03/04/16 1003  AST 20  ALT 18  ALKPHOS 72  BILITOT 0.3  PROT 8.5*  ALBUMIN 4.0    Recent Labs Lab 03/04/16 1003  LIPASE 44   No results for input(s): AMMONIA in the last 168 hours. CBC:  Recent  Labs Lab 03/04/16 1003  WBC 11.0*  HGB 12.7*  HCT 37.3*  MCV 90.8  PLT 242   Cardiac Enzymes: No results for input(s): CKTOTAL, CKMB, CKMBINDEX, TROPONINI in the last 168 hours. BNP: Invalid input(s): POCBNP CBG:  Recent Labs Lab 03/04/16 1000  GLUCAP 209*   D-Dimer No results for input(s): DDIMER in the last 72 hours. Hgb A1c No results for input(s): HGBA1C in the last 72 hours. Lipid Profile No results for input(s): CHOL, HDL, LDLCALC, TRIG, CHOLHDL, LDLDIRECT in the last 72 hours. Thyroid function studies No results for input(s): TSH, T4TOTAL, T3FREE, THYROIDAB in the last 72 hours.  Invalid input(s): FREET3 Anemia work up No results for input(s): VITAMINB12, FOLATE, FERRITIN, TIBC, IRON, RETICCTPCT in the last 72 hours. Urinalysis    Component Value Date/Time   COLORURINE YELLOW 03/04/2016 1040   APPEARANCEUR CLEAR 03/04/2016 1040   LABSPEC 1.025 03/04/2016 1040   PHURINE 5.5 03/04/2016 1040   GLUCOSEU 250 (A) 03/04/2016 1040   HGBUR TRACE (A) 03/04/2016 1040   BILIRUBINUR NEGATIVE 03/04/2016 1040   KETONESUR NEGATIVE 03/04/2016 1040   PROTEINUR >300 (A) 03/04/2016 1040   UROBILINOGEN 2.0 (H) 09/27/2014 1844   NITRITE NEGATIVE 03/04/2016 1040   LEUKOCYTESUR NEGATIVE 03/04/2016 1040   Sepsis Labs Invalid input(s): PROCALCITONIN,  WBC,  LACTICIDVEN Microbiology No results found for this or any previous visit (from the past 240 hour(s)).   Time coordinating discharge: Over 30 minutes  SIGNED:   Debbe Odea, MD  [facility] 03/04/2016, 2:05 PM Pager   If 7PM-7AM, please contact night-coverage [email or web address] Password [email or web address]

## 2016-03-08 NOTE — Discharge Summary (Deleted)
  The note originally documented on this encounter has been moved the the encounter in which it belongs.  

## 2016-03-31 ENCOUNTER — Ambulatory Visit (INDEPENDENT_AMBULATORY_CARE_PROVIDER_SITE_OTHER): Payer: PPO | Admitting: Orthopedic Surgery

## 2016-04-01 ENCOUNTER — Encounter (HOSPITAL_COMMUNITY): Payer: Self-pay | Admitting: Emergency Medicine

## 2016-04-01 ENCOUNTER — Ambulatory Visit (HOSPITAL_COMMUNITY)
Admission: EM | Admit: 2016-04-01 | Discharge: 2016-04-01 | Disposition: A | Discharge status: Nursing Home (Includes ICF) | Payer: PPO | Attending: Family Medicine | Admitting: Family Medicine

## 2016-04-01 ENCOUNTER — Encounter (HOSPITAL_COMMUNITY): Payer: Self-pay

## 2016-04-01 ENCOUNTER — Emergency Department (HOSPITAL_COMMUNITY)
Admission: EM | Admit: 2016-04-01 | Discharge: 2016-04-01 | Disposition: A | Discharge status: Home / Self Care | Payer: PPO | Attending: Dermatology | Admitting: Dermatology

## 2016-04-01 DIAGNOSIS — Z5321 Procedure and treatment not carried out due to patient leaving prior to being seen by health care provider: Secondary | ICD-10-CM | POA: Insufficient documentation

## 2016-04-01 DIAGNOSIS — N183 Chronic kidney disease, stage 3 (moderate): Secondary | ICD-10-CM | POA: Diagnosis not present

## 2016-04-01 DIAGNOSIS — E1122 Type 2 diabetes mellitus with diabetic chronic kidney disease: Secondary | ICD-10-CM | POA: Diagnosis not present

## 2016-04-01 DIAGNOSIS — M86171 Other acute osteomyelitis, right ankle and foot: Secondary | ICD-10-CM | POA: Diagnosis not present

## 2016-04-01 DIAGNOSIS — A363 Cutaneous diphtheria: Secondary | ICD-10-CM | POA: Diagnosis not present

## 2016-04-01 DIAGNOSIS — I129 Hypertensive chronic kidney disease with stage 1 through stage 4 chronic kidney disease, or unspecified chronic kidney disease: Secondary | ICD-10-CM | POA: Insufficient documentation

## 2016-04-01 DIAGNOSIS — Z794 Long term (current) use of insulin: Secondary | ICD-10-CM | POA: Insufficient documentation

## 2016-04-01 DIAGNOSIS — F17210 Nicotine dependence, cigarettes, uncomplicated: Secondary | ICD-10-CM | POA: Insufficient documentation

## 2016-04-01 NOTE — ED Triage Notes (Signed)
Wound on right foot, foot is warm to touch, draining.  Patient is diabetic.  Patient denies fever.  Patient does not feel sick in general, all pain is specific to his foot.

## 2016-04-01 NOTE — ED Triage Notes (Signed)
Onset 1 week wound on bottom of right foot, swollen, red, and draining foul red-brownish discharge.  No fevers.

## 2016-04-01 NOTE — ED Notes (Signed)
Called x3 in waiting room, no answer.

## 2016-04-01 NOTE — ED Provider Notes (Signed)
[facility]    CSN: [id number] Arrival date & time: 04/01/16  1746     History   Chief Complaint Chief Complaint  Patient presents with  . Wound Infection    HPI [name] is a 40 y.o. male.   The history is provided by the patient.  Foot Pain  This is a recurrent problem. The current episode started more than 1 week ago. The problem has been gradually worsening. Associated symptoms comments: Pain, swelling, foul smelling drainage in diabetic.[name]    Past Medical History:  Diagnosis Date  . Acute osteomyelitis, ankle and foot 07/30/2010   Qualifier: Diagnosis of  By: Tommy Medal MD, Roderic Scarce    . Anemia   . Chronic kidney disease (CKD), stage III (moderate)   . Collagen vascular disease ([place])   . DDD (degenerative disc disease), lumbar   . Gastroparesis   . GERD (gastroesophageal reflux disease)   . Hiatal hernia   . Hyperlipidemia   . Hypertension   . Pancreatitis   . Peripheral vascular disease ([place])   . Polysubstance abuse 03/08/2016  . Renal insufficiency   . Type II diabetes mellitus ([place]) dx'd ~ 1996  . Vascular disease    poor circulation to left foot    Patient Active Problem List   Diagnosis Date Noted  . Polysubstance abuse 03/08/2016  . Diabetic infection of right foot ([place]) 02/27/2016  . Diabetic foot infection ([place]) 02/27/2016  . Tobacco abuse 02/27/2016  . Gastroparesis 05/28/2015  . GERD (gastroesophageal reflux disease) 10/01/2014  . Right foot infection 10/01/2014  . Osteomyelitis of foot, right, acute ([place]) 10/01/2014  . Diabetic foot ulcer ([place]) 10/01/2014  . Esophageal reflux   . Fever 09/27/2014  . Abdominal pain, lower 09/27/2014  . Abnormal ECG 05/30/2014  . Chest pain 05/30/2014  . Ankle pain 05/30/2014  . Pain in the chest   . Nausea 08/30/2013  . Epigastric abdominal pain 08/30/2013  . Low grade fever 08/30/2013  . Diabetic foot ulcer with osteomyelitis ([place]) 05/12/2013  . Osteomyelitis ([place]) 05/12/2013  .  Diabetic osteomyelitis b/l toes 03/23/2013  . DM (diabetes mellitus) type II uncontrolled, periph vascular disorder ([place]) 03/23/2013  . CKD (chronic kidney disease), stage III 03/23/2013  . Anemia 03/23/2013  . Benign essential HTN 03/23/2013  . Diabetic foot ulcers ([place]) 03/22/2013  . AKI (acute kidney injury) ([place]) 03/22/2013  . Viral gastroenteritis 09/17/2012  . Nausea & vomiting 09/16/2012  . Acute pancreatitis 09/16/2012  . Diabetes mellitus ([place]) 09/20/2010  . Onychomycosis 09/20/2010  . METHICILLIN SUSCEPTIBLE STAPH AUREUS SEPTICEMIA 07/30/2010  . ACUTE OSTEOMYELITIS, ANKLE AND FOOT 07/30/2010    Past Surgical History:  Procedure Laterality Date  . AMPUTATION  04/25/2011   Procedure: AMPUTATION DIGIT;  Surgeon: Newt Minion, MD;  Location: [facility];  Service: Orthopedics;  Laterality: Left;  Left foot 3rd toe amputation MTP joint, Gastroc Recession  Achilles Lengthening   . AMPUTATION Bilateral 03/25/2013   Procedure: AMPUTATION RAY;  Surgeon: Newt Minion, MD;  Location: [facility];  Service: Orthopedics;  Laterality: Bilateral;  Left Great Toe Amputation at  MTP Joint, Right 1st and 2nd Ray Amputation   . AMPUTATION Right 10/03/2014   Procedure: AMPUTATION MIDFOOT;  Surgeon: Newt Minion, MD;  Location: [facility];  Service: Orthopedics;  Laterality: Right;  . LAPAROSCOPIC CHOLECYSTECTOMY    . TOE AMPUTATION  2012   left foot; great toe and second toe       Home Medications    Prior  to Admission medications   Medication Sig Start Date End Date Taking? Authorizing Provider  gabapentin (NEURONTIN) 100 MG capsule Take 2 capsules (200 mg total) by mouth 3 (three) times daily. 02/28/16  Yes Debbe Odea, MD  lisinopril (PRINIVIL,ZESTRIL) 10 MG tablet Take 10 mg by mouth every morning.    Yes Historical Provider, MD  Multiple Vitamin (MULTIVITAMIN WITH MINERALS) TABS tablet Take 1 tablet by mouth every morning.    Yes Historical Provider, MD  blood glucose meter kit and supplies KIT  Dispense based on patient and insurance preference. Use to check blood sugar two times daily as directed. (FOR ICD-9 250.00, 250.01). 09/30/14   Barton Dubois, MD  doxycycline (VIBRAMYCIN) 100 MG capsule Take 1 capsule (100 mg total) by mouth 2 (two) times daily. Patient taking differently: Take 100 mg by mouth 2 (two) times daily. Started 09/23 for 14 days 02/28/16   Debbe Odea, MD  insulin aspart (NOVOLOG) 100 UNIT/ML injection Inject 3 Units into the skin 3 (three) times daily with meals. 10/05/14   Belkys A Regalado, MD  insulin glargine (LANTUS) 100 UNIT/ML injection Inject 0.15 mLs (15 Units total) into the skin at bedtime. 10/05/14   Belkys A Regalado, MD  Insulin Pen Needle 31G X 5 MM MISC Use one needle to inject insulin with flexpen as indicated twice a day 09/30/14   Barton Dubois, MD  metoCLOPramide (REGLAN) 10 MG tablet Take 1 tablet (10 mg total) by mouth every 6 (six) hours as needed for nausea or vomiting. 03/01/16   Daleen Bo, MD  oxyCODONE-acetaminophen (PERCOCET/ROXICET) 5-325 MG tablet Take 1 tablet by mouth every 6 (six) hours as needed for severe pain. Patient not taking: Reported on 04/01/2016 02/28/16   Debbe Odea, MD  polyethylene glycol [facility]) packet Take 17 g by mouth 2 (two) times daily as needed. Patient taking differently: Take 17 g by mouth 2 (two) times daily as needed for mild constipation or moderate constipation.  02/28/16   Debbe Odea, MD    Family History Family History  Problem Relation Age of Onset  . Heart attack Father 60  . Hypertension Sister     Social History Social History  Substance Use Topics  . Smoking status: Current Every Day Smoker    Packs/day: 0.10    Years: 4.00    Types: Cigarettes  . Smokeless tobacco: Never Used  . Alcohol use No     Allergies   Review of patient's allergies indicates no known allergies.   Review of Systems Review of Systems  Musculoskeletal: Positive for gait problem and joint swelling.  Skin:  Positive for wound.  All other systems reviewed and are negative.    Physical Exam Triage Vital Signs ED Triage Vitals  Enc Vitals Group     BP 04/01/16 1809 105/71     Pulse Rate 04/01/16 1809 80     Resp 04/01/16 1809 22     Temp 04/01/16 1809 98.6 F (37 C)     Temp Source 04/01/16 1809 Oral     SpO2 04/01/16 1809 100 %     Weight --      Height --      Head Circumference --      Peak Flow --      Pain Score 04/01/16 1813 9     Pain Loc --      Pain Edu? --      Excl. in [place]? --    No data found.   Updated Vital Signs BP  105/71 (BP Location: Right Arm) Comment (BP Location): large cuff  Pulse 80   Temp 98.6 F (37 C) (Oral)   Resp 22   SpO2 100%   Visual Acuity Right Eye Distance:   Left Eye Distance:   Bilateral Distance:    Right Eye Near:   Left Eye Near:    Bilateral Near:     Physical Exam  Constitutional: He appears well-developed and well-nourished. He appears distressed.  Musculoskeletal: He exhibits edema, tenderness and deformity.  Foul smelling draining lesion right foot.  Neurological: He is alert.  Nursing note and vitals reviewed.    UC Treatments / Results  Labs (all labs ordered are listed, but only abnormal results are displayed) Labs Reviewed - No data to display  EKG  EKG Interpretation None       Radiology No results found.  Procedures Procedures (including critical care time)  Medications Ordered in UC Medications - No data to display   Initial Impression / Assessment and Plan / UC Course  I have reviewed the triage vital signs and the nursing notes.  Pertinent labs & imaging results that were available during my care of the patient were reviewed by me and considered in my medical decision making (see chart for details).  Clinical Course    Sent for eval of prob osteo of right foot. Pt followed by dr duda.  Final Clinical Impressions(s) / UC Diagnoses   Final diagnoses:  None    New Prescriptions New  Prescriptions   No medications on file     Billy Fischer, MD 04/01/16 4627

## 2016-04-01 NOTE — ED Notes (Signed)
Patient placed in treatment room.  Patient removed shoe and sock as instructed .  Patient has a hole in bottom/edge of right foot.  Strong odor present.  Patient has had toes amputated over the past year

## 2016-04-03 ENCOUNTER — Telehealth (INDEPENDENT_AMBULATORY_CARE_PROVIDER_SITE_OTHER): Payer: Self-pay | Admitting: Orthopedic Surgery

## 2016-04-03 NOTE — Telephone Encounter (Signed)
Patient requesting a RX refill on oxyCODONE-acetaminophen (PERCOCET/ROXICET) 5-325 MG tablet   Contact Info: [phone]

## 2016-04-04 ENCOUNTER — Other Ambulatory Visit (INDEPENDENT_AMBULATORY_CARE_PROVIDER_SITE_OTHER): Payer: Self-pay | Admitting: Family

## 2016-04-04 MED ORDER — OXYCODONE-ACETAMINOPHEN 5-325 MG PO TABS: 1.0000 | ORAL_TABLET | Freq: Four times a day (QID) | ORAL | 0 refills | Status: DC | PRN

## 2016-04-04 NOTE — Telephone Encounter (Signed)
erin pt is requesting refill on pain medication and lst refill 02/29/16 #30 please advise.

## 2016-04-04 NOTE — Telephone Encounter (Signed)
Will leave rx at front desk, will you call patient

## 2016-04-07 NOTE — Telephone Encounter (Signed)
Called pt to advise that rx is ready for pick up. Left message on machine.

## 2016-04-09 ENCOUNTER — Encounter (HOSPITAL_BASED_OUTPATIENT_CLINIC_OR_DEPARTMENT_OTHER): Payer: PPO | Attending: Surgery

## 2016-04-09 DIAGNOSIS — E1151 Type 2 diabetes mellitus with diabetic peripheral angiopathy without gangrene: Secondary | ICD-10-CM | POA: Insufficient documentation

## 2016-04-09 DIAGNOSIS — I129 Hypertensive chronic kidney disease with stage 1 through stage 4 chronic kidney disease, or unspecified chronic kidney disease: Secondary | ICD-10-CM | POA: Insufficient documentation

## 2016-04-09 DIAGNOSIS — F17210 Nicotine dependence, cigarettes, uncomplicated: Secondary | ICD-10-CM | POA: Diagnosis not present

## 2016-04-09 DIAGNOSIS — E11621 Type 2 diabetes mellitus with foot ulcer: Secondary | ICD-10-CM | POA: Diagnosis not present

## 2016-04-09 DIAGNOSIS — Z89412 Acquired absence of left great toe: Secondary | ICD-10-CM | POA: Diagnosis not present

## 2016-04-09 DIAGNOSIS — N189 Chronic kidney disease, unspecified: Secondary | ICD-10-CM | POA: Diagnosis not present

## 2016-04-09 DIAGNOSIS — E1140 Type 2 diabetes mellitus with diabetic neuropathy, unspecified: Secondary | ICD-10-CM | POA: Insufficient documentation

## 2016-04-09 DIAGNOSIS — F17218 Nicotine dependence, cigarettes, with other nicotine-induced disorders: Secondary | ICD-10-CM | POA: Diagnosis not present

## 2016-04-09 DIAGNOSIS — L97512 Non-pressure chronic ulcer of other part of right foot with fat layer exposed: Secondary | ICD-10-CM | POA: Diagnosis not present

## 2016-04-09 DIAGNOSIS — E1122 Type 2 diabetes mellitus with diabetic chronic kidney disease: Secondary | ICD-10-CM | POA: Diagnosis not present

## 2016-04-09 DIAGNOSIS — Z89431 Acquired absence of right foot: Secondary | ICD-10-CM | POA: Insufficient documentation

## 2016-04-09 DIAGNOSIS — L84 Corns and callosities: Secondary | ICD-10-CM | POA: Insufficient documentation

## 2016-04-14 ENCOUNTER — Ambulatory Visit (INDEPENDENT_AMBULATORY_CARE_PROVIDER_SITE_OTHER): Payer: PPO | Admitting: Orthopedic Surgery

## 2016-04-18 ENCOUNTER — Ambulatory Visit (INDEPENDENT_AMBULATORY_CARE_PROVIDER_SITE_OTHER): Payer: PPO | Admitting: Family

## 2016-04-18 ENCOUNTER — Encounter (INDEPENDENT_AMBULATORY_CARE_PROVIDER_SITE_OTHER): Payer: Self-pay | Admitting: Orthopedic Surgery

## 2016-04-18 ENCOUNTER — Ambulatory Visit (INDEPENDENT_AMBULATORY_CARE_PROVIDER_SITE_OTHER): Payer: PPO

## 2016-04-18 VITALS — Ht 73.0 in | Wt 235.0 lb

## 2016-04-18 DIAGNOSIS — L97412 Non-pressure chronic ulcer of right heel and midfoot with fat layer exposed: Secondary | ICD-10-CM

## 2016-04-18 DIAGNOSIS — L97512 Non-pressure chronic ulcer of other part of right foot with fat layer exposed: Secondary | ICD-10-CM | POA: Diagnosis not present

## 2016-04-18 DIAGNOSIS — E11621 Type 2 diabetes mellitus with foot ulcer: Secondary | ICD-10-CM | POA: Diagnosis not present

## 2016-04-18 MED ORDER — MUPIROCIN 2 % EX OINT: 1.0000 "application " | TOPICAL_OINTMENT | Freq: Two times a day (BID) | CUTANEOUS | 6 refills | Status: DC

## 2016-04-18 MED ORDER — SULFAMETHOXAZOLE-TRIMETHOPRIM 800-160 MG PO TABS: 1.0000 | ORAL_TABLET | Freq: Two times a day (BID) | ORAL | 0 refills | Status: DC

## 2016-04-18 NOTE — Progress Notes (Signed)
Wound Care Note   Patient: Curtis FettersLamont Belford           Date of Birth: 10/16/1975           MRN: [id number]             PCP: Dorrene GermanEdwin A Avbuere, MD Visit Date: 04/18/2016   Assessment & Plan: Visit Diagnoses:  1. Non-pressure chronic ulcer of other part of right foot with fat layer exposed (HCC)   2. Diabetic ulcer of right midfoot associated with type 2 diabetes mellitus, with fat layer exposed (HCC)     Plan: Pack wound open with mupirocin dressings daily. To be nonweight bearing. Return symptoms discussed. Will follow up in 2 weeks. Have discussed critical nature and possibility of need for further limb salvage surgery.   Follow-Up Instructions: No Follow-up on file.  Orders:  Orders Placed This Encounter  Procedures  . XR Foot 2 Views Right   Meds ordered this encounter  Medications  . sulfamethoxazole-trimethoprim (BACTRIM DS,SEPTRA DS) 800-160 MG tablet    Sig: Take 1 tablet by mouth 2 (two) times daily.    Dispense:  28 tablet    Refill:  0  . DISCONTD: mupirocin ointment (BACTROBAN) 2 %    Sig: Apply 1 application topically 2 (two) times daily.    Dispense:  22 g    Refill:  6  . mupirocin ointment (BACTROBAN) 2 %    Sig: Apply 1 application topically 2 (two) times daily.    Dispense:  22 g    Refill:  6      Procedures: No notes on file   Clinical Data: No additional findings.   No images are attached to the encounter.   Subjective: Chief Complaint  Patient presents with  . Right Foot - Wound Check    10/03/14 right transmet amputation    Patient is a right transmetatarsal amputation from 09/2014. Patient has a large open area plantar aspect of foot. It is dry and has an odor. The skin is discolored, callused and pt is treating with neosporin and gauze. He is weight bearing in a post op shoe and not taking an abx.   Wound Check     Review of Systems  Constitutional: Negative for chills and fever.  All other systems reviewed and are  negative.      Objective: Vital Signs: Ht 6\' 1"  (1.854 m)   Wt 235 lb (106.6 kg)   BMI 31.00 kg/m   Physical Exam: There is a 3 cm in diameter open ulceration with surrounding callus build up. This probes 15 mm deep. Does no probe to bone. Surrounding skin and soft tissue were debrided with a #10 blade knife back to viable tissue. There is a foul odor. No drainage. No surrounding erythema.  Specialty Comments: No specialty comments available.   PMFS History: Patient Active Problem List   Diagnosis Date Noted  . Polysubstance abuse 03/08/2016  . Tobacco abuse 02/27/2016  . Gastroparesis 05/28/2015  . GERD (gastroesophageal reflux disease) 10/01/2014  . Right foot infection 10/01/2014  . Osteomyelitis of foot, right, acute (HCC) 10/01/2014  . Esophageal reflux   . Fever 09/27/2014  . Abdominal pain, lower 09/27/2014  . Abnormal ECG 05/30/2014  . Chest pain 05/30/2014  . Ankle pain 05/30/2014  . Pain in the chest   . Nausea 08/30/2013  . Epigastric abdominal pain 08/30/2013  . Low grade fever 08/30/2013  . Diabetic foot ulcer with osteomyelitis (HCC) 05/12/2013  . Osteomyelitis (HCC) 05/12/2013  .  Diabetic osteomyelitis b/l toes 03/23/2013  . DM (diabetes mellitus) type II uncontrolled, periph vascular disorder (HCC) 03/23/2013  . CKD (chronic kidney disease), stage III 03/23/2013  . Anemia 03/23/2013  . Benign essential HTN 03/23/2013  . Diabetic foot ulcers (HCC) 03/22/2013  . AKI (acute kidney injury) (HCC) 03/22/2013  . Viral gastroenteritis 09/17/2012  . Nausea & vomiting 09/16/2012  . Acute pancreatitis 09/16/2012  . Diabetes mellitus (HCC) 09/20/2010  . Onychomycosis 09/20/2010  . METHICILLIN SUSCEPTIBLE STAPH AUREUS SEPTICEMIA 07/30/2010  . ACUTE OSTEOMYELITIS, ANKLE AND FOOT 07/30/2010   Past Medical History:  Diagnosis Date  . Acute osteomyelitis, ankle and foot 07/30/2010   Qualifier: Diagnosis of  By: Daiva EvesVan Dam MD, Remi Haggardornelius    . Anemia   . Chronic  kidney disease (CKD), stage III (moderate)   . Collagen vascular disease (HCC)   . DDD (degenerative disc disease), lumbar   . Gastroparesis   . GERD (gastroesophageal reflux disease)   . Hiatal hernia   . Hyperlipidemia   . Hypertension   . Pancreatitis   . Peripheral vascular disease (HCC)   . Polysubstance abuse 03/08/2016  . Renal insufficiency   . Type II diabetes mellitus (HCC) dx'd ~ 1996  . Vascular disease    poor circulation to left foot    Family History  Problem Relation Age of Onset  . Heart attack Father [id number]  . Hypertension Sister    Past Surgical History:  Procedure Laterality Date  . AMPUTATION  04/25/2011   Procedure: AMPUTATION DIGIT;  Surgeon: Nadara MustardMarcus V Duda, MD;  [facility];  Service: Orthopedics;  Laterality: Left;  Left foot 3rd toe amputation MTP joint, Gastroc Recession  Achilles Lengthening   . AMPUTATION Bilateral 03/25/2013   Procedure: AMPUTATION RAY;  Surgeon: Nadara MustardMarcus V Duda, MD;  Location: [facility] OR;  Service: Orthopedics;  Laterality: Bilateral;  Left Great Toe Amputation at  MTP Joint, Right 1st and 2nd Ray Amputation   . AMPUTATION Right 10/03/2014   Procedure: AMPUTATION MIDFOOT;  Surgeon: Nadara MustardMarcus Duda V, MD;  [facility];  Service: Orthopedics;  Laterality: Right;  . LAPAROSCOPIC CHOLECYSTECTOMY    . TOE AMPUTATION  2012   left foot; great toe and second toe   Social History   Occupational History  . Not on file.   Social History Main Topics  . Smoking status: Current Every Day Smoker    Packs/day: 0.10    Years: 4.00    Types: Cigarettes  . Smokeless tobacco: Never Used  . Alcohol use No  . Drug use:     Frequency: 10.0 times per week    Types: Marijuana  . Sexual activity: Yes    Birth control/ protection: None

## 2016-04-21 NOTE — Addendum Note (Signed)
Addended by: Barnie DelZAMORA, Murna Backer R on: 04/21/2016 08:59 AM   Modules accepted: Orders

## 2016-04-24 ENCOUNTER — Telehealth (INDEPENDENT_AMBULATORY_CARE_PROVIDER_SITE_OTHER): Payer: Self-pay | Admitting: *Deleted

## 2016-04-24 NOTE — Telephone Encounter (Signed)
Pt would like refill on percocet. Last refill was 04/07/16 #30

## 2016-04-24 NOTE — Telephone Encounter (Signed)
Called and lm on vm to advise pt of message below. To call first thing in the morning to make an appt advised he can press option for triage and whomever answers the phone will make the appt for him to come in tomorrow morning for eval.

## 2016-04-24 NOTE — Telephone Encounter (Signed)
Pt requesting RX of percocet.

## 2016-04-24 NOTE — Telephone Encounter (Signed)
If he is having significant pain let's see him in the office tomorrow morning

## 2016-04-25 ENCOUNTER — Encounter (INDEPENDENT_AMBULATORY_CARE_PROVIDER_SITE_OTHER): Payer: Self-pay | Admitting: Orthopedic Surgery

## 2016-04-25 ENCOUNTER — Ambulatory Visit (INDEPENDENT_AMBULATORY_CARE_PROVIDER_SITE_OTHER): Payer: PPO | Admitting: Orthopedic Surgery

## 2016-04-25 VITALS — Ht 73.0 in | Wt 235.0 lb

## 2016-04-25 DIAGNOSIS — L97511 Non-pressure chronic ulcer of other part of right foot limited to breakdown of skin: Secondary | ICD-10-CM

## 2016-04-25 DIAGNOSIS — Z89431 Acquired absence of right foot: Secondary | ICD-10-CM | POA: Diagnosis not present

## 2016-04-25 NOTE — Progress Notes (Signed)
Office Visit Note   Patient: Curtis FettersLamont Purohit           Date of Birth: 01/19/1976           MRN: [id number] Visit Date: 04/25/2016              Requested by: Fleet ContrasEdwin Avbuere, MD [address] [place], [place] [id number] PCP: Dorrene GermanEdwin A Avbuere, MD   Assessment & Plan: Visit Diagnoses:  1. Status post transmetatarsal amputation of foot, right (HCC)   2. Right foot ulcer, limited to breakdown of skin (HCC)    Venous insufficiency swelling right lower extremity no ulcers.  Plan: Patient has had chronic recurrent ulceration over the tip of the transmetatarsal amputation distal to the second metatarsal. Patient has undergone conservative therapy with heel cord stretching pressure unloading and wound care as well as debridement without resolution. Patient due to failure of conservative care would like to proceed with surgical intervention plan for revision of the transmetatarsal amputation with gastrocnemius recession. [facility] follow-up in the office 1-2 weeks postoperatively.  Follow-Up Instructions: Return if symptoms worsen or fail to improve.   Orders:  No orders of the defined types were placed in this encounter.  No orders of the defined types were placed in this encounter.     Procedures: No procedures performed   Clinical Data: No additional findings.   Subjective: Chief Complaint  Patient presents with  . Right Foot - Pain    Transmetatarsal amputation 09/2015    Patient was seen 1 week ago with an ulcer to the tip of his left transmet amputation. He was placed on bactrim and advised to use ABX ointment to ulcer daily. He comes in today with worsening of pain. The ulcer is small in size but does have depth and small amount blood draining. There is a large thick callus build up around the wound. The patient states that he is in pain all the time every day. He is full weight bearing in regular shoe wear.     Review of  Systems   Objective: Vital Signs: Ht 6\' 1"  (1.854 m)   Wt 235 lb (106.6 kg)   BMI 31.00 kg/m   Physical Exam on examination patient is alert oriented no adenopathy well-dressed the left pectoral restore effort he does have an antalgic gait examination is heel cord contracture the right with dorsiflexion about 10 short of neutral with his knee extended. He has venous stasis swelling in his leg which is tender but no open woundscellulitis no signs of infection. He has a chronic ulcer with callus around the transmetatarsal amputation second metatarsal. Radiographs shows hypertrophic bone formation but no specific destructive bony changes. Patient has a strong dorsalis pedis and posterior tibial pulse.  Ortho Exam  Specialty Comments:  No specialty comments available.  Imaging: No results found.   PMFS History: Patient Active Problem List   Diagnosis Date Noted  . Polysubstance abuse 03/08/2016  . Tobacco abuse 02/27/2016  . Gastroparesis 05/28/2015  . GERD (gastroesophageal reflux disease) 10/01/2014  . Right foot infection 10/01/2014  . Osteomyelitis of foot, right, acute (HCC) 10/01/2014  . Esophageal reflux   . Fever 09/27/2014  . Abdominal pain, lower 09/27/2014  . Abnormal ECG 05/30/2014  . Chest pain 05/30/2014  . Ankle pain 05/30/2014  . Pain in the chest   . Nausea 08/30/2013  . Epigastric abdominal pain 08/30/2013  . Low grade fever 08/30/2013  . Diabetic foot ulcer with osteomyelitis (HCC) 05/12/2013  .  Osteomyelitis (HCC) 05/12/2013  . Diabetic osteomyelitis b/l toes 03/23/2013  . DM (diabetes mellitus) type II uncontrolled, periph vascular disorder (HCC) 03/23/2013  . CKD (chronic kidney disease), stage III 03/23/2013  . Anemia 03/23/2013  . Benign essential HTN 03/23/2013  . Diabetic foot ulcers (HCC) 03/22/2013  . AKI (acute kidney injury) (HCC) 03/22/2013  . Viral gastroenteritis 09/17/2012  . Nausea & vomiting 09/16/2012  . Acute pancreatitis 09/16/2012   . Diabetes mellitus (HCC) 09/20/2010  . Onychomycosis 09/20/2010  . METHICILLIN SUSCEPTIBLE STAPH AUREUS SEPTICEMIA 07/30/2010  . ACUTE OSTEOMYELITIS, ANKLE AND FOOT 07/30/2010   Past Medical History:  Diagnosis Date  . Acute osteomyelitis, ankle and foot 07/30/2010   Qualifier: Diagnosis of  By: Daiva EvesVan Dam MD, Remi Haggardornelius    . Anemia   . Chronic kidney disease (CKD), stage III (moderate)   . Collagen vascular disease (HCC)   . DDD (degenerative disc disease), lumbar   . Gastroparesis   . GERD (gastroesophageal reflux disease)   . Hiatal hernia   . Hyperlipidemia   . Hypertension   . Pancreatitis   . Peripheral vascular disease (HCC)   . Polysubstance abuse 03/08/2016  . Renal insufficiency   . Type II diabetes mellitus (HCC) dx'd ~ 1996  . Vascular disease    poor circulation to left foot    Family History  Problem Relation Age of Onset  . Heart attack Father [phone]  . Hypertension Sister     Past Surgical History:  Procedure Laterality Date  . AMPUTATION  04/25/2011   Procedure: AMPUTATION DIGIT;  Surgeon: Nadara MustardMarcus V Duda, MD;  [facility];  Service: Orthopedics;  Laterality: Left;  Left foot 3rd toe amputation MTP joint, Gastroc Recession  Achilles Lengthening   . AMPUTATION Bilateral 03/25/2013   Procedure: AMPUTATION RAY;  Surgeon: Nadara MustardMarcus V Duda, MD;  Location: [facility] OR;  Service: Orthopedics;  Laterality: Bilateral;  Left Great Toe Amputation at  MTP Joint, Right 1st and 2nd Ray Amputation   . AMPUTATION Right 10/03/2014   Procedure: AMPUTATION MIDFOOT;  Surgeon: Nadara MustardMarcus Duda V, MD;  [facility];  Service: Orthopedics;  Laterality: Right;  . LAPAROSCOPIC CHOLECYSTECTOMY    . TOE AMPUTATION  2012   left foot; great toe and second toe   Social History   Occupational History  . Not on file.   Social History Main Topics  . Smoking status: Current Every Day Smoker    Packs/day: 0.10    Years: 4.00    Types: Cigarettes  . Smokeless tobacco: Never Used  . Alcohol use No   . Drug use:     Frequency: 10.0 times per week    Types: Marijuana  . Sexual activity: Yes    Birth control/ protection: None

## 2016-05-05 ENCOUNTER — Ambulatory Visit (INDEPENDENT_AMBULATORY_CARE_PROVIDER_SITE_OTHER): Payer: PPO | Admitting: Orthopedic Surgery

## 2016-05-07 ENCOUNTER — Emergency Department (HOSPITAL_COMMUNITY)
Admission: EM | Admit: 2016-05-07 | Discharge: 2016-05-07 | Disposition: A | Discharge status: Home / Self Care | Payer: PPO | Attending: Emergency Medicine | Admitting: Emergency Medicine

## 2016-05-07 ENCOUNTER — Emergency Department (HOSPITAL_COMMUNITY): Payer: PPO

## 2016-05-07 ENCOUNTER — Encounter (HOSPITAL_COMMUNITY): Payer: Self-pay | Admitting: *Deleted

## 2016-05-07 DIAGNOSIS — R109 Unspecified abdominal pain: Secondary | ICD-10-CM | POA: Diagnosis not present

## 2016-05-07 DIAGNOSIS — Z794 Long term (current) use of insulin: Secondary | ICD-10-CM | POA: Diagnosis not present

## 2016-05-07 DIAGNOSIS — K3184 Gastroparesis: Secondary | ICD-10-CM | POA: Diagnosis not present

## 2016-05-07 DIAGNOSIS — E860 Dehydration: Secondary | ICD-10-CM | POA: Diagnosis not present

## 2016-05-07 DIAGNOSIS — F17210 Nicotine dependence, cigarettes, uncomplicated: Secondary | ICD-10-CM | POA: Insufficient documentation

## 2016-05-07 DIAGNOSIS — E1122 Type 2 diabetes mellitus with diabetic chronic kidney disease: Secondary | ICD-10-CM | POA: Diagnosis not present

## 2016-05-07 DIAGNOSIS — I129 Hypertensive chronic kidney disease with stage 1 through stage 4 chronic kidney disease, or unspecified chronic kidney disease: Secondary | ICD-10-CM | POA: Diagnosis not present

## 2016-05-07 DIAGNOSIS — N183 Chronic kidney disease, stage 3 (moderate): Secondary | ICD-10-CM | POA: Insufficient documentation

## 2016-05-07 LAB — COMPREHENSIVE METABOLIC PANEL
ALBUMIN: 3.8 g/dL (ref 3.5–5.0)
ALT: 18 U/L (ref 17–63)
ANION GAP: 9 (ref 5–15)
AST: 20 U/L (ref 15–41)
Alkaline Phosphatase: 80 U/L (ref 38–126)
BUN: 22 mg/dL — AB (ref 6–20)
CHLORIDE: 104 mmol/L (ref 101–111)
CO2: 23 mmol/L (ref 22–32)
Calcium: 9.7 mg/dL (ref 8.9–10.3)
Creatinine, Ser: 2.3 mg/dL — ABNORMAL HIGH (ref 0.61–1.24)
GFR calc Af Amer: 39 mL/min — ABNORMAL LOW (ref >60)
GFR, EST NON AFRICAN AMERICAN: 34 mL/min — AB (ref >60)
Glucose, Bld: 222 mg/dL — ABNORMAL HIGH (ref 65–99)
POTASSIUM: 4.5 mmol/L (ref 3.5–5.1)
Sodium: 136 mmol/L (ref 135–145)
Total Bilirubin: 0.6 mg/dL (ref 0.3–1.2)
Total Protein: 8.2 g/dL — ABNORMAL HIGH (ref 6.5–8.1)

## 2016-05-07 LAB — CBC
HEMATOCRIT: 38.6 % — AB (ref 39.0–52.0)
HEMOGLOBIN: 13.2 g/dL (ref 13.0–17.0)
MCH: 30.6 pg (ref 26.0–34.0)
MCHC: 34.2 g/dL (ref 30.0–36.0)
MCV: 89.4 fL (ref 78.0–100.0)
Platelets: 221 10*3/uL (ref 150–400)
RBC: 4.32 MIL/uL (ref 4.22–5.81)
RDW: 13.3 % (ref 11.5–15.5)
WBC: 10.5 10*3/uL (ref 4.0–10.5)

## 2016-05-07 LAB — LIPASE, BLOOD: LIPASE: 27 U/L (ref 11–51)

## 2016-05-07 LAB — POC OCCULT BLOOD, ED: Fecal Occult Bld: NEGATIVE

## 2016-05-07 MED ORDER — METOCLOPRAMIDE HCL 10 MG PO TABS: 10.0000 mg | ORAL_TABLET | Freq: Four times a day (QID) | ORAL | 0 refills | Status: DC | PRN

## 2016-05-07 MED ORDER — PROMETHAZINE HCL 25 MG/ML IJ SOLN
12.5000 mg | Freq: Once | INTRAMUSCULAR | Status: AC
  Administered 2016-05-07: 12.5 mg via INTRAVENOUS
  Filled 2016-05-07: qty 1

## 2016-05-07 MED ORDER — ONDANSETRON HCL 4 MG/2ML IJ SOLN
4.0000 mg | Freq: Once | INTRAMUSCULAR | Status: AC
  Administered 2016-05-07: 4 mg via INTRAVENOUS
  Filled 2016-05-07: qty 2

## 2016-05-07 MED ORDER — SODIUM CHLORIDE 0.9 % IV BOLUS (SEPSIS)
1000.0000 mL | Freq: Once | INTRAVENOUS | Status: AC
  Administered 2016-05-07: 1000 mL via INTRAVENOUS

## 2016-05-07 MED ORDER — HALOPERIDOL LACTATE 5 MG/ML IJ SOLN
5.0000 mg | Freq: Once | INTRAMUSCULAR | Status: AC
  Administered 2016-05-07: 5 mg via INTRAVENOUS
  Filled 2016-05-07: qty 1

## 2016-05-07 NOTE — ED Provider Notes (Signed)
[facility]-EMERGENCY DEPT Provider Note   CSN: [id number] Arrival date & time: 05/07/16  0907     History   Chief Complaint Chief Complaint  Patient presents with  . Abdominal Pain  . Emesis    HPI [name] is a 40 y.o. male.  HPI   40 year old maleWith history of diabetes, gastroparesis, pancreatitis, GERD, hypertension, polysubstance abuse presenting with complaints of abdominal pain and vomiting. Patient report last night after eating a baked chicken he became nauseous and since then has had 10-15 bouts of vomiting with red content. He is now dry heaving. Complaining of sharp achy right-sided abdominal pain and rated as 10 out of 10. Endorse chills, feeling weak, occasional cough. Pain felt similar to prior gastroparesis. Denies fever, headache, chest pain, shortness of breath, hemoptysis, back pain, dysuria, hematuria. Last bowel movement was 2 days ago which is normal for him. Able to pass flatus. Admits to using marijuana recreationally but denies any recent alcohol use of other street drug use. Denies any prior history of small bowel obstruction. Denies any recent travel or eating exotic food. Does have history of osteomyelitis of the foot that was recently treated with antibiotic. History of chronic kidney disease.    Past Medical History:  Diagnosis Date  . Acute osteomyelitis, ankle and foot 07/30/2010   Qualifier: Diagnosis of  By: Daiva EvesVan Dam MD, Remi Haggardornelius    . Anemia   . Chronic kidney disease (CKD), stage III (moderate)   . Collagen vascular disease (HCC)   . DDD (degenerative disc disease), lumbar   . Gastroparesis   . GERD (gastroesophageal reflux disease)   . Hiatal hernia   . Hyperlipidemia   . Hypertension   . Pancreatitis   . Peripheral vascular disease (HCC)   . Polysubstance abuse 03/08/2016  . Renal insufficiency   . Type II diabetes mellitus (HCC) dx'd ~ 1996  . Vascular disease    poor circulation to left foot    Patient Active Problem List   Diagnosis Date Noted  . Polysubstance abuse 03/08/2016  . Tobacco abuse 02/27/2016  . Gastroparesis 05/28/2015  . GERD (gastroesophageal reflux disease) 10/01/2014  . Right foot infection 10/01/2014  . Osteomyelitis of foot, right, acute (HCC) 10/01/2014  . Esophageal reflux   . Fever 09/27/2014  . Abdominal pain, lower 09/27/2014  . Abnormal ECG 05/30/2014  . Chest pain 05/30/2014  . Ankle pain 05/30/2014  . Pain in the chest   . Nausea 08/30/2013  . Epigastric abdominal pain 08/30/2013  . Low grade fever 08/30/2013  . Diabetic foot ulcer with osteomyelitis (HCC) 05/12/2013  . Osteomyelitis (HCC) 05/12/2013  . Diabetic osteomyelitis b/l toes 03/23/2013  . DM (diabetes mellitus) type II uncontrolled, periph vascular disorder (HCC) 03/23/2013  . CKD (chronic kidney disease), stage III 03/23/2013  . Anemia 03/23/2013  . Benign essential HTN 03/23/2013  . Diabetic foot ulcers (HCC) 03/22/2013  . AKI (acute kidney injury) (HCC) 03/22/2013  . Viral gastroenteritis 09/17/2012  . Nausea & vomiting 09/16/2012  . Acute pancreatitis 09/16/2012  . Diabetes mellitus (HCC) 09/20/2010  . Onychomycosis 09/20/2010  . METHICILLIN SUSCEPTIBLE STAPH AUREUS SEPTICEMIA 07/30/2010  . ACUTE OSTEOMYELITIS, ANKLE AND FOOT 07/30/2010    Past Surgical History:  Procedure Laterality Date  . AMPUTATION  04/25/2011   Procedure: AMPUTATION DIGIT;  Surgeon: Nadara MustardMarcus V Duda, MD;  [facility];  Service: Orthopedics;  Laterality: Left;  Left foot 3rd toe amputation MTP joint, Gastroc Recession  Achilles Lengthening   . AMPUTATION Bilateral 03/25/2013  Procedure: AMPUTATION RAY;  Surgeon: Nadara Mustard, MD;  Location: [facility] OR;  Service: Orthopedics;  Laterality: Bilateral;  Left Great Toe Amputation at  MTP Joint, Right 1st and 2nd Ray Amputation   . AMPUTATION Right 10/03/2014   Procedure: AMPUTATION MIDFOOT;  Surgeon: Nadara Mustard, MD;  Location: [facility] OR;  Service: Orthopedics;  Laterality: Right;  .  LAPAROSCOPIC CHOLECYSTECTOMY    . TOE AMPUTATION  2012   left foot; great toe and second toe       Home Medications    Prior to Admission medications   Medication Sig Start Date End Date Taking? Authorizing Provider  gabapentin (NEURONTIN) 100 MG capsule Take 2 capsules (200 mg total) by mouth 3 (three) times daily. 02/28/16  Yes Calvert Cantor, MD  insulin glargine (LANTUS) 100 UNIT/ML injection Inject 0.15 mLs (15 Units total) into the skin at bedtime. 10/05/14  Yes Belkys A Regalado, MD  lisinopril (PRINIVIL,ZESTRIL) 10 MG tablet Take 10 mg by mouth every morning.    Yes Historical Provider, MD  mupirocin ointment (BACTROBAN) 2 % Apply 1 application topically 2 (two) times daily. 04/18/16  Yes Berton Lan, NP  metoCLOPramide (REGLAN) 10 MG tablet Take 1 tablet (10 mg total) by mouth every 6 (six) hours as needed for nausea or vomiting. Patient not taking: Reported on 05/07/2016 03/01/16   Mancel Bale, MD  Multiple Vitamin (MULTIVITAMIN WITH MINERALS) TABS tablet Take 1 tablet by mouth every morning.     Historical Provider, MD  sulfamethoxazole-trimethoprim (BACTRIM DS,SEPTRA DS) 800-160 MG tablet Take 1 tablet by mouth 2 (two) times daily. Patient not taking: Reported on 05/07/2016 04/18/16   Berton Lan, NP    Family History Family History  Problem Relation Age of Onset  . Heart attack Father 46  . Hypertension Sister     Social History Social History  Substance Use Topics  . Smoking status: Current Every Day Smoker    Packs/day: 0.10    Years: 4.00    Types: Cigarettes  . Smokeless tobacco: Never Used  . Alcohol use No     Allergies   Patient has no known allergies.   Review of Systems Review of Systems  All other systems reviewed and are negative.    Physical Exam Updated Vital Signs BP (!) 153/110 (BP Location: Left Arm)   Pulse 76   Temp 98 F (36.7 C) (Oral)   Resp 16   Ht 6\' 1"  (1.854 m)   Wt 108.6 kg   SpO2 100%   BMI 31.59 kg/m    Physical Exam  Constitutional: He appears well-developed and well-nourished.  Patient appears uncomfortable laying in bed.  HENT:  Head: Atraumatic.  Mouth/Throat: Oropharynx is clear and moist.  Eyes: Conjunctivae are normal.  Neck: Neck supple.  Cardiovascular: Normal rate and regular rhythm.   Pulmonary/Chest: Effort normal and breath sounds normal.  Abdominal: Soft. He exhibits no distension. There is tenderness (Diffuse abdominal tenderness most significant to right abdomen on palpation but without guarding or rebound tenderness. Diminished bowel sounds.).  Genitourinary:  Genitourinary Comments: Chaperone present during exam. Normal rectal tone. No stool impaction. Normal color stool on glove. No mass.  Neurological: He is alert.  Skin: No rash noted.  Psychiatric: He has a normal mood and affect.  Nursing note and vitals reviewed.    ED Treatments / Results  Labs (all labs ordered are listed, but only abnormal results are displayed) Labs Reviewed  COMPREHENSIVE METABOLIC PANEL - Abnormal; Notable for the following:  Result Value   Glucose, Bld 222 (*)    BUN 22 (*)    Creatinine, Ser 2.30 (*)    Total Protein 8.2 (*)    GFR calc non Af Amer 34 (*)    GFR calc Af Amer 39 (*)    All other components within normal limits  CBC - Abnormal; Notable for the following:    HCT 38.6 (*)    All other components within normal limits  LIPASE, BLOOD  URINALYSIS, ROUTINE W REFLEX MICROSCOPIC (NOT AT [facility])  POC OCCULT BLOOD, ED  POC OCCULT BLOOD, ED    EKG  EKG Interpretation None       Radiology Dg Abd Acute W/chest  Result Date: 05/07/2016 CLINICAL DATA:  Right side abdominal pain, vomiting. EXAM: DG ABDOMEN ACUTE W/ 1V CHEST COMPARISON:  03/04/2016 FINDINGS: Prior cholecystectomy. The bowel gas pattern is normal. There is no evidence of free intraperitoneal air. No suspicious radio-opaque calculi or other significant radiographic abnormality is seen. Heart size  and mediastinal contours are within normal limits. Both lungs are clear. IMPRESSION: No acute findings. No active cardiopulmonary disease. Electronically Signed   By: Charlett NoseKevin  Dover [name]   On: 05/07/2016 13:42    Procedures Procedures (including critical care time)  Medications Ordered in ED Medications  ondansetron (ZOFRAN) injection 4 mg (4 mg Intravenous Given 05/07/16 1224)  sodium chloride 0.9 % bolus 1,000 mL (0 mLs Intravenous Stopped 05/07/16 1307)  haloperidol lactate (HALDOL) injection 5 mg (5 mg Intravenous Given 05/07/16 1307)  promethazine (PHENERGAN) injection 12.5 mg (12.5 mg Intravenous Given 05/07/16 1308)     Initial Impression / Assessment and Plan / ED Course  I have reviewed the triage vital signs and the nursing notes.  Pertinent labs & imaging results that were available during my care of the patient were reviewed by me and considered in my medical decision making (see chart for details).  Clinical Course     BP 144/80   Pulse 84   Temp 98 F (36.7 C) (Oral)   Resp 16   Ht 6\' 1"  (1.854 m)   Wt 108.6 kg   SpO2 93%   BMI 31.59 kg/m    Final Clinical Impressions(s) / ED Diagnoses   Final diagnoses:  Gastroparesis    New Prescriptions Current Discharge Medication List     12:50 PM Patient with history of recurrent abdominal pain, gastroparesis, malalignment or recreational use here with abdominal pain nausea and vomiting. Suspect symptoms likely secondary to gastroparesis likely due to an abnormal type emesis. Since he has had abdominal surgery in the past, will obtain acute abdominal series to assess for potential small bowel obstruction although my suspicion is low. To provide symptomatic treatment including IV fluid, antinausea medication, and Haldol.  DDx: gastroparesis, SBO, esophageal varices, Malorry Weiss Tear. PUD, pancreatitis.  2:26 PM Acute abdominal series unremarkable. Patient felt much better after receiving IV fluid, antinausea  medication, and Haldol. He is able to tolerates by mouth. He felt better and requesting to be discharged. Return precaution discussed.   Fayrene HelperBowie Daelynn Blower, PA-C 05/07/16 1427    Arby BarretteMarcy Pfeiffer, MD 05/10/16 1315

## 2016-05-07 NOTE — Discharge Instructions (Signed)
Please avoid using marijuana as it may worsen your condition.  Take Reglan as needed for nausea.  Stay hydrated.  Follow up with your doctor for further care.

## 2016-05-07 NOTE — ED Triage Notes (Signed)
Pt reports onset last night of right side abd pain and n/v. Denies diarrhea.

## 2016-05-07 NOTE — ED Notes (Signed)
Pt stable, ambulatory, states understanding of discharge instructions 

## 2016-05-09 ENCOUNTER — Ambulatory Visit (HOSPITAL_COMMUNITY)
Admission: EM | Admit: 2016-05-09 | Discharge: 2016-05-09 | Disposition: A | Discharge status: Home / Self Care | Payer: PPO | Attending: Family Medicine | Admitting: Family Medicine

## 2016-05-09 ENCOUNTER — Encounter (HOSPITAL_COMMUNITY): Payer: Self-pay | Admitting: Emergency Medicine

## 2016-05-09 DIAGNOSIS — R1011 Right upper quadrant pain: Secondary | ICD-10-CM

## 2016-05-09 DIAGNOSIS — R110 Nausea: Secondary | ICD-10-CM | POA: Diagnosis not present

## 2016-05-09 DIAGNOSIS — K3184 Gastroparesis: Secondary | ICD-10-CM

## 2016-05-09 MED ORDER — KETOROLAC TROMETHAMINE 60 MG/2ML IM SOLN
INTRAMUSCULAR | Status: AC
  Filled 2016-05-09: qty 2

## 2016-05-09 MED ORDER — ONDANSETRON 4 MG PO TBDP
4.0000 mg | ORAL_TABLET | Freq: Once | ORAL | Status: AC
  Administered 2016-05-09: 4 mg via ORAL

## 2016-05-09 MED ORDER — KETOROLAC TROMETHAMINE 60 MG/2ML IM SOLN
60.0000 mg | Freq: Once | INTRAMUSCULAR | Status: AC
  Administered 2016-05-09: 60 mg via INTRAMUSCULAR

## 2016-05-09 MED ORDER — ONDANSETRON 8 MG PO TBDP: 8.0000 mg | ORAL_TABLET | Freq: Three times a day (TID) | ORAL | 0 refills | Status: DC | PRN

## 2016-05-09 MED ORDER — ONDANSETRON 4 MG PO TBDP
4.0000 mg | ORAL_TABLET | Freq: Once | ORAL | Status: AC
Start: 2016-05-09 — End: 2016-05-09
  Administered 2016-05-09: 4 mg via ORAL

## 2016-05-09 MED ORDER — ONDANSETRON 4 MG PO TBDP
ORAL_TABLET | ORAL | Status: AC
  Filled 2016-05-09: qty 2

## 2016-05-09 NOTE — ED Triage Notes (Signed)
[facility] a [facility] of abdominal pain and cramping with N/V x 3 days. The patient reported that he was given zofran and pain meds in the ED on 05/07/2016 but it has not helped.

## 2016-05-09 NOTE — ED Provider Notes (Signed)
CSN: [id number]     Arrival date & time 05/09/16  1011 History   None    Chief Complaint  Patient presents with  . Abdominal Pain   (Consider location/radiation/quality/duration/timing/severity/associated sxs/prior Treatment) Patient c/o Nausea and severe abdominal pain.  Patient was seen in the ED and was discharged with dx of gastroparesis.  He was prescribed reglan and states he keeps vomiting it up. He has no antiemtics.  Patient has hx of substance abuse and pancreatitis and polysubstance abuses.   The history is provided by the patient.  Abdominal Pain  Pain location:  Epigastric and RUQ Pain quality: aching   Pain radiates to:  Does not radiate Pain severity:  Severe Onset quality:  Sudden Duration:  12 hours Timing:  Constant Progression:  Waxing and waning Chronicity:  New Context: alcohol use   Relieved by:  Nothing Worsened by:  Nothing Associated symptoms: fatigue, nausea and vomiting     Past Medical History:  Diagnosis Date  . Acute osteomyelitis, ankle and foot 07/30/2010   Qualifier: Diagnosis of  By: Daiva Eves MD, Remi Haggard    . Anemia   . Chronic kidney disease (CKD), stage III (moderate)   . Collagen vascular disease (HCC)   . DDD (degenerative disc disease), lumbar   . Gastroparesis   . GERD (gastroesophageal reflux disease)   . Hiatal hernia   . Hyperlipidemia   . Hypertension   . Pancreatitis   . Peripheral vascular disease (HCC)   . Polysubstance abuse 03/08/2016  . Renal insufficiency   . Type II diabetes mellitus (HCC) dx'd ~ 1996  . Vascular disease    poor circulation to left foot   Past Surgical History:  Procedure Laterality Date  . AMPUTATION  04/25/2011   Procedure: AMPUTATION DIGIT;  Surgeon: Nadara Mustard, MD;  Location: [facility] OR;  Service: Orthopedics;  Laterality: Left;  Left foot 3rd toe amputation MTP joint, Gastroc Recession  Achilles Lengthening   . AMPUTATION Bilateral 03/25/2013   Procedure: AMPUTATION RAY;  Surgeon: Nadara Mustard, MD;  Location: [facility] OR;  Service: Orthopedics;  Laterality: Bilateral;  Left Great Toe Amputation at  MTP Joint, Right 1st and 2nd Ray Amputation   . AMPUTATION Right 10/03/2014   Procedure: AMPUTATION MIDFOOT;  Surgeon: Nadara Mustard, MD;  Location[facility];  Service: Orthopedics;  Laterality: Right;  . LAPAROSCOPIC CHOLECYSTECTOMY    . TOE AMPUTATION  2012   left foot; great toe and second toe   Family History  Problem Relation Age of Onset  . Heart attack Father 83  . Hypertension Sister    Social History  Substance Use Topics  . Smoking status: Current Every Day Smoker    Packs/day: 0.10    Years: 4.00    Types: Cigarettes  . Smokeless tobacco: Never Used  . Alcohol use No    Review of Systems  Constitutional: Positive for fatigue.  HENT: Negative.   Eyes: Negative.   Respiratory: Negative.   Cardiovascular: Negative.   Gastrointestinal: Positive for abdominal pain, nausea and vomiting.  Endocrine: Negative.   Genitourinary: Negative.   Musculoskeletal: Negative.   Skin: Negative.   Allergic/Immunologic: Negative.   Neurological: Negative.   Hematological: Negative.   Psychiatric/Behavioral: Negative.     Allergies  Patient has no known allergies.  Home Medications   Prior to Admission medications   Medication Sig Start Date End Date Taking? Authorizing Provider  gabapentin (NEURONTIN) 100 MG capsule Take 2 capsules (200 mg total) by mouth 3 (  three) times daily. 02/28/16  Yes Calvert CantorSaima Rizwan, MD  insulin glargine (LANTUS) 100 UNIT/ML injection Inject 0.15 mLs (15 Units total) into the skin at bedtime. 10/05/14  Yes Belkys A Regalado, MD  lisinopril (PRINIVIL,ZESTRIL) 10 MG tablet Take 10 mg by mouth every morning.    Yes Historical Provider, MD  metoCLOPramide (REGLAN) 10 MG tablet Take 1 tablet (10 mg total) by mouth every 6 (six) hours as needed for nausea or vomiting. 05/07/16  Yes Fayrene HelperBowie Tran, PA-C  Multiple Vitamin (MULTIVITAMIN WITH MINERALS) TABS tablet Take 1  tablet by mouth every morning.     Historical Provider, MD  mupirocin ointment (BACTROBAN) 2 % Apply 1 application topically 2 (two) times daily. 04/18/16   Berton LanErin Renee Zamora, NP  ondansetron (ZOFRAN ODT) 8 MG disintegrating tablet Take 1 tablet (8 mg total) by mouth every 8 (eight) hours as needed for nausea or vomiting. 05/09/16   Deatra CanterWilliam J Oxford, FNP  sulfamethoxazole-trimethoprim (BACTRIM DS,SEPTRA DS) 800-160 MG tablet Take 1 tablet by mouth 2 (two) times daily. Patient not taking: Reported on 05/07/2016 04/18/16   Berton LanErin Renee Zamora, NP   Meds Ordered and Administered this Visit   Medications  ketorolac (TORADOL) injection 60 mg (60 mg Intramuscular Given 05/09/16 1100)  ondansetron (ZOFRAN-ODT) disintegrating tablet 4 mg (4 mg Oral Given 05/09/16 1100)  ondansetron (ZOFRAN-ODT) disintegrating tablet 4 mg (4 mg Oral Given 05/09/16 1101)    BP (!) 159/107 (BP Location: Left Arm)   Pulse 88   Temp 98.1 F (36.7 C) (Oral)   Resp 20   SpO2 100%  No data found.   Physical Exam  Constitutional: He is oriented to person, place, and time. He appears well-developed. He appears distressed.  HENT:  Head: Normocephalic and atraumatic.  Eyes: Conjunctivae and EOM are normal. Pupils are equal, round, and reactive to light.  Neck: Normal range of motion. Neck supple.  Cardiovascular: Normal rate, regular rhythm and normal heart sounds.   Pulmonary/Chest: Effort normal and breath sounds normal.  Abdominal: There is tenderness. There is guarding.  TTP RUQ and Epigastric region  Musculoskeletal: Normal range of motion.  Neurological: He is alert and oriented to person, place, and time.  Nursing note and vitals reviewed.   Urgent Care Course   Clinical Course     Procedures (including critical care time)  Labs Review Labs Reviewed - No data to display  Imaging Review Dg Abd Acute W/chest  Result Date: 05/07/2016 CLINICAL DATA:  Right side abdominal pain, vomiting. EXAM: DG ABDOMEN  ACUTE W/ 1V CHEST COMPARISON:  03/04/2016 FINDINGS: Prior cholecystectomy. The bowel gas pattern is normal. There is no evidence of free intraperitoneal air. No suspicious radio-opaque calculi or other significant radiographic abnormality is seen. Heart size and mediastinal contours are within normal limits. Both lungs are clear. IMPRESSION: No acute findings. No active cardiopulmonary disease. Electronically Signed   By: Charlett NoseKevin  Dover [name]   On: 05/07/2016 13:42     Visual Acuity Review  Right Eye Distance:   Left Eye Distance:   Bilateral Distance:    Right Eye Near:   Left Eye Near:    Bilateral Near:         MDM   1. Right upper quadrant abdominal pain   2. Nausea   3. Gastroparesis    Zofran ODT 8mg  po now Toradol 60mg  IM He requests something for pain and agree to give toradol but any other pain meds needs to come from the ED. Explained that he should go  to the ED for this severe abdominal pain and get higher level of care. Recommend patient need to go to ED for severe NV And abdominal pain but he refuses.  Zofran 8mg  one po tid prn #21  Explained he needs to seek Emergency Room Care if not better.      Deatra CanterWilliam J Oxford, FNP 05/09/16 1115

## 2016-05-09 NOTE — Discharge Instructions (Signed)
Go to ED if not better.

## 2016-05-13 ENCOUNTER — Encounter (HOSPITAL_COMMUNITY): Payer: Self-pay | Admitting: Emergency Medicine

## 2016-05-13 ENCOUNTER — Emergency Department (HOSPITAL_COMMUNITY)
Admission: EM | Admit: 2016-05-13 | Discharge: 2016-05-13 | Discharge status: Against Medical Advice | Payer: PPO | Attending: Emergency Medicine | Admitting: Emergency Medicine

## 2016-05-13 DIAGNOSIS — R112 Nausea with vomiting, unspecified: Secondary | ICD-10-CM | POA: Diagnosis not present

## 2016-05-13 DIAGNOSIS — R1084 Generalized abdominal pain: Secondary | ICD-10-CM | POA: Diagnosis not present

## 2016-05-13 DIAGNOSIS — N183 Chronic kidney disease, stage 3 (moderate): Secondary | ICD-10-CM | POA: Insufficient documentation

## 2016-05-13 DIAGNOSIS — Z79899 Other long term (current) drug therapy: Secondary | ICD-10-CM | POA: Insufficient documentation

## 2016-05-13 DIAGNOSIS — F17210 Nicotine dependence, cigarettes, uncomplicated: Secondary | ICD-10-CM | POA: Insufficient documentation

## 2016-05-13 DIAGNOSIS — E1122 Type 2 diabetes mellitus with diabetic chronic kidney disease: Secondary | ICD-10-CM | POA: Diagnosis not present

## 2016-05-13 DIAGNOSIS — R1115 Cyclical vomiting syndrome unrelated to migraine: Secondary | ICD-10-CM

## 2016-05-13 DIAGNOSIS — G43A0 Cyclical vomiting, not intractable: Secondary | ICD-10-CM | POA: Diagnosis not present

## 2016-05-13 DIAGNOSIS — Z794 Long term (current) use of insulin: Secondary | ICD-10-CM | POA: Insufficient documentation

## 2016-05-13 DIAGNOSIS — I129 Hypertensive chronic kidney disease with stage 1 through stage 4 chronic kidney disease, or unspecified chronic kidney disease: Secondary | ICD-10-CM | POA: Insufficient documentation

## 2016-05-13 LAB — COMPREHENSIVE METABOLIC PANEL
ALBUMIN: 4.7 g/dL (ref 3.5–5.0)
ALT: 16 U/L — ABNORMAL LOW (ref 17–63)
ANION GAP: 10 (ref 5–15)
AST: 22 U/L (ref 15–41)
Alkaline Phosphatase: 74 U/L (ref 38–126)
BUN: 22 mg/dL — AB (ref 6–20)
CO2: 27 mmol/L (ref 22–32)
Calcium: 9.4 mg/dL (ref 8.9–10.3)
Chloride: 98 mmol/L — ABNORMAL LOW (ref 101–111)
Creatinine, Ser: 1.85 mg/dL — ABNORMAL HIGH (ref 0.61–1.24)
GFR calc Af Amer: 51 mL/min — ABNORMAL LOW (ref >60)
GFR calc non Af Amer: 44 mL/min — ABNORMAL LOW (ref >60)
GLUCOSE: 213 mg/dL — AB (ref 65–99)
POTASSIUM: 3.4 mmol/L — AB (ref 3.5–5.1)
SODIUM: 135 mmol/L (ref 135–145)
TOTAL PROTEIN: 8.7 g/dL — AB (ref 6.5–8.1)
Total Bilirubin: 0.4 mg/dL (ref 0.3–1.2)

## 2016-05-13 LAB — URINALYSIS, ROUTINE W REFLEX MICROSCOPIC
BACTERIA UA: NONE SEEN
Bilirubin Urine: NEGATIVE
GLUCOSE, UA: 50 mg/dL — AB
HGB URINE DIPSTICK: NEGATIVE
KETONES UR: NEGATIVE mg/dL
Leukocytes, UA: NEGATIVE
NITRITE: NEGATIVE
Specific Gravity, Urine: 1.024 (ref 1.005–1.030)
pH: 5 (ref 5.0–8.0)

## 2016-05-13 LAB — CBC
HEMATOCRIT: 38.9 % — AB (ref 39.0–52.0)
HEMOGLOBIN: 13.4 g/dL (ref 13.0–17.0)
MCH: 30.6 pg (ref 26.0–34.0)
MCHC: 34.4 g/dL (ref 30.0–36.0)
MCV: 88.8 fL (ref 78.0–100.0)
Platelets: 246 10*3/uL (ref 150–400)
RBC: 4.38 MIL/uL (ref 4.22–5.81)
RDW: 13.2 % (ref 11.5–15.5)
WBC: 10.5 10*3/uL (ref 4.0–10.5)

## 2016-05-13 LAB — LIPASE, BLOOD: LIPASE: 28 U/L (ref 11–51)

## 2016-05-13 MED ORDER — ONDANSETRON HCL 4 MG/2ML IJ SOLN
4.0000 mg | Freq: Once | INTRAMUSCULAR | Status: AC
  Administered 2016-05-13: 4 mg via INTRAVENOUS
  Filled 2016-05-13: qty 2

## 2016-05-13 MED ORDER — HALOPERIDOL LACTATE 5 MG/ML IJ SOLN
5.0000 mg | Freq: Once | INTRAMUSCULAR | Status: AC
  Administered 2016-05-13: 5 mg via INTRAVENOUS
  Filled 2016-05-13: qty 1

## 2016-05-13 MED ORDER — SODIUM CHLORIDE 0.9 % IV BOLUS (SEPSIS)
1000.0000 mL | Freq: Once | INTRAVENOUS | Status: AC
  Administered 2016-05-13: 1000 mL via INTRAVENOUS

## 2016-05-13 NOTE — ED Notes (Signed)
Patient nor visitor in room or in restroom.

## 2016-05-13 NOTE — ED Notes (Signed)
Pt is aware that a urine sample is needed but is unable to obtain one at this time. Pt has a urinal at the bedside 

## 2016-05-13 NOTE — ED Notes (Signed)
Asked for urine  

## 2016-05-13 NOTE — ED Notes (Signed)
Patient c/o IV hurting and requested RN take it out.  Removed per patient request

## 2016-05-13 NOTE — ED Notes (Signed)
Patient not in room nor any where in department.  Patient must left before being up for discharge or given discharge instructions.

## 2016-05-13 NOTE — ED Notes (Signed)
Patient nor visitor in room.

## 2016-05-13 NOTE — ED Provider Notes (Signed)
[facility]-EMERGENCY DEPT Provider Note   CSN: [id number] Arrival date & time: 05/13/16  [id number]     History   Chief Complaint Chief Complaint  Patient presents with  . Abdominal Pain    HPI [name] is a 40 y.o. male.  Pt presents to the ED this am with n/v and abdominal pain.  He has a hx of chronic abdominal pain and cyclic vomiting syndrome.  He was at urgent care on 12/1 and in the ED on 11/29 for the same.  The pt said his sx started during the night.  He denies any f/c.      Past Medical History:  Diagnosis Date  . Acute osteomyelitis, ankle and foot 07/30/2010   Qualifier: Diagnosis of  By: Daiva EvesVan Dam MD, Remi Haggardornelius    . Anemia   . Chronic kidney disease (CKD), stage III (moderate)   . Collagen vascular disease (HCC)   . DDD (degenerative disc disease), lumbar   . Gastroparesis   . GERD (gastroesophageal reflux disease)   . Hiatal hernia   . Hyperlipidemia   . Hypertension   . Pancreatitis   . Peripheral vascular disease (HCC)   . Polysubstance abuse 03/08/2016  . Renal insufficiency   . Type II diabetes mellitus (HCC) dx'd ~ 1996  . Vascular disease    poor circulation to left foot    Patient Active Problem List   Diagnosis Date Noted  . Polysubstance abuse 03/08/2016  . Tobacco abuse 02/27/2016  . Gastroparesis 05/28/2015  . GERD (gastroesophageal reflux disease) 10/01/2014  . Right foot infection 10/01/2014  . Osteomyelitis of foot, right, acute (HCC) 10/01/2014  . Esophageal reflux   . Fever 09/27/2014  . Abdominal pain, lower 09/27/2014  . Abnormal ECG 05/30/2014  . Chest pain 05/30/2014  . Ankle pain 05/30/2014  . Pain in the chest   . Nausea 08/30/2013  . Epigastric abdominal pain 08/30/2013  . Low grade fever 08/30/2013  . Diabetic foot ulcer with osteomyelitis (HCC) 05/12/2013  . Osteomyelitis (HCC) 05/12/2013  . Diabetic osteomyelitis b/l toes 03/23/2013  . DM (diabetes mellitus) type II uncontrolled, periph vascular disorder (HCC)  03/23/2013  . CKD (chronic kidney disease), stage III 03/23/2013  . Anemia 03/23/2013  . Benign essential HTN 03/23/2013  . Diabetic foot ulcers (HCC) 03/22/2013  . AKI (acute kidney injury) (HCC) 03/22/2013  . Viral gastroenteritis 09/17/2012  . Nausea & vomiting 09/16/2012  . Acute pancreatitis 09/16/2012  . Diabetes mellitus (HCC) 09/20/2010  . Onychomycosis 09/20/2010  . METHICILLIN SUSCEPTIBLE STAPH AUREUS SEPTICEMIA 07/30/2010  . ACUTE OSTEOMYELITIS, ANKLE AND FOOT 07/30/2010    Past Surgical History:  Procedure Laterality Date  . AMPUTATION  04/25/2011   Procedure: AMPUTATION DIGIT;  Surgeon: Nadara MustardMarcus V Duda, MD;  [facility];  Service: Orthopedics;  Laterality: Left;  Left foot 3rd toe amputation MTP joint, Gastroc Recession  Achilles Lengthening   . AMPUTATION Bilateral 03/25/2013   Procedure: AMPUTATION RAY;  Surgeon: Nadara MustardMarcus V Duda, MD;  Location: [facility] OR;  Service: Orthopedics;  Laterality: Bilateral;  Left Great Toe Amputation at  MTP Joint, Right 1st and 2nd Ray Amputation   . AMPUTATION Right 10/03/2014   Procedure: AMPUTATION MIDFOOT;  Surgeon: Nadara MustardMarcus Duda V, MD;  [facility];  Service: Orthopedics;  Laterality: Right;  . LAPAROSCOPIC CHOLECYSTECTOMY    . TOE AMPUTATION  2012   left foot; great toe and second toe       Home Medications    Prior to Admission medications   Medication  Sig Start Date End Date Taking? Authorizing Provider  gabapentin (NEURONTIN) 100 MG capsule Take 2 capsules (200 mg total) by mouth 3 (three) times daily. Patient taking differently: Take 200 mg by mouth 3 (three) times daily as needed (pain).  02/28/16  Yes Calvert CantorSaima Rizwan, MD  insulin glargine (LANTUS) 100 UNIT/ML injection Inject 0.15 mLs (15 Units total) into the skin at bedtime. 10/05/14  Yes Belkys A Regalado, MD  lisinopril (PRINIVIL,ZESTRIL) 10 MG tablet Take 10 mg by mouth every morning.    Yes Historical Provider, MD  metoCLOPramide (REGLAN) 10 MG tablet Take 1 tablet (10 mg  total) by mouth every 6 (six) hours as needed for nausea or vomiting. 05/07/16  Yes Fayrene HelperBowie Tran, PA-C  mupirocin ointment (BACTROBAN) 2 % Apply 1 application topically 2 (two) times daily. 04/18/16  Yes Berton LanErin Renee Zamora, NP  ondansetron (ZOFRAN ODT) 8 MG disintegrating tablet Take 1 tablet (8 mg total) by mouth every 8 (eight) hours as needed for nausea or vomiting. 05/09/16  Yes Deatra CanterWilliam J Oxford, FNP  sulfamethoxazole-trimethoprim (BACTRIM DS,SEPTRA DS) 800-160 MG tablet Take 1 tablet by mouth 2 (two) times daily. Patient not taking: Reported on 05/13/2016 04/18/16   Berton LanErin Renee Zamora, NP    Family History Family History  Problem Relation Age of Onset  . Heart attack Father 2052  . Hypertension Sister     Social History Social History  Substance Use Topics  . Smoking status: Current Every Day Smoker    Packs/day: 0.10    Years: 4.00    Types: Cigarettes  . Smokeless tobacco: Never Used  . Alcohol use No     Allergies   Patient has no known allergies.   Review of Systems Review of Systems  Gastrointestinal: Positive for abdominal pain, nausea and vomiting.  All other systems reviewed and are negative.    Physical Exam Updated Vital Signs BP 161/99 (BP Location: Left Arm)   Pulse 78   Temp 97.9 F (36.6 C) (Oral)   Resp 18   SpO2 100%   Physical Exam  Constitutional: He is oriented to person, place, and time. He appears well-developed and well-nourished. He appears distressed.  HENT:  Head: Normocephalic and atraumatic.  Right Ear: External ear normal.  Left Ear: External ear normal.  Nose: Nose normal.  Mouth/Throat: Oropharynx is clear and moist.  Eyes: Conjunctivae and EOM are normal. Pupils are equal, round, and reactive to light.  Neck: Normal range of motion. Neck supple.  Cardiovascular: Normal rate, regular rhythm, normal heart sounds and intact distal pulses.   Pulmonary/Chest: Effort normal and breath sounds normal.  Abdominal: Soft. There is generalized  tenderness.  Musculoskeletal: Normal range of motion.  Neurological: He is alert and oriented to person, place, and time.  Skin: Skin is warm.  Psychiatric: He has a normal mood and affect. His behavior is normal. Judgment and thought content normal.  Nursing note and vitals reviewed.    ED Treatments / Results  Labs (all labs ordered are listed, but only abnormal results are displayed) Labs Reviewed  COMPREHENSIVE METABOLIC PANEL - Abnormal; Notable for the following:       Result Value   Potassium 3.4 (*)    Chloride 98 (*)    Glucose, Bld 213 (*)    BUN 22 (*)    Creatinine, Ser 1.85 (*)    Total Protein 8.7 (*)    ALT 16 (*)    GFR calc non Af Amer 44 (*)    GFR calc Af  Amer 51 (*)    All other components within normal limits  CBC - Abnormal; Notable for the following:    HCT 38.9 (*)    All other components within normal limits  LIPASE, BLOOD  URINALYSIS, ROUTINE W REFLEX MICROSCOPIC (NOT AT [facility])    EKG  EKG Interpretation None       Radiology No results found.  Procedures Procedures (including critical care time)  Medications Ordered in ED Medications  sodium chloride 0.9 % bolus 1,000 mL (0 mLs Intravenous Stopped 05/13/16 0930)  ondansetron (ZOFRAN) injection 4 mg (4 mg Intravenous Given 05/13/16 0738)  haloperidol lactate (HALDOL) injection 5 mg (5 mg Intravenous Given 05/13/16 0755)     Initial Impression / Assessment and Plan / ED Course  I have reviewed the triage vital signs and the nursing notes.  Pertinent labs & imaging results that were available during my care of the patient were reviewed by me and considered in my medical decision making (see chart for details).  Clinical Course    Pt feeling better after IVFs, haldol, and zofran.  While waiting for the UA, pt eloped.  Pt left before he could receive any discharge instructions, rx, or referrals.  Final Clinical Impressions(s) / ED Diagnoses   Final diagnoses:  Generalized abdominal  pain  Non-intractable cyclical vomiting with nausea    New Prescriptions Discharge Medication List as of 05/13/2016 10:45 AM       Jacalyn Lefevre, MD 05/13/16 1106

## 2016-05-13 NOTE — ED Triage Notes (Signed)
Pt states he has had pain in his right side for the past two days  Pt states he has also had nausea, vomiting, low grade fever, chills  Denies diarrhea

## 2016-05-14 ENCOUNTER — Ambulatory Visit (INDEPENDENT_AMBULATORY_CARE_PROVIDER_SITE_OTHER): Payer: PPO | Admitting: Orthopedic Surgery

## 2016-05-14 ENCOUNTER — Encounter (INDEPENDENT_AMBULATORY_CARE_PROVIDER_SITE_OTHER): Payer: Self-pay | Admitting: Orthopedic Surgery

## 2016-05-14 VITALS — Ht 73.0 in | Wt 239.0 lb

## 2016-05-14 DIAGNOSIS — Z89431 Acquired absence of right foot: Secondary | ICD-10-CM

## 2016-05-14 DIAGNOSIS — L97511 Non-pressure chronic ulcer of other part of right foot limited to breakdown of skin: Secondary | ICD-10-CM

## 2016-05-14 NOTE — Progress Notes (Signed)
This encounter was created in error - please disregard.

## 2016-05-14 NOTE — Progress Notes (Signed)
Office Visit Note   Patient: Curtis FettersLamont Clark           Date of Birth: 03/07/1976           MRN: [id number] Visit Date: 05/14/2016              Requested by: Fleet ContrasEdwin Avbuere, MD [address] [place] [id number] PCP: Dorrene GermanEdwin A Avbuere, MD   Assessment & Plan: Visit Diagnoses:  1. Acquired absence of right foot (HCC)   2. Right foot ulcer, limited to breakdown of skin (HCC)     Plan: We will plan for revision transmetatarsal amputation the right with gastrocnemius recession. Risk and benefits were discussed including infection neurovascular injury nonhealing of the wound the importance of nonweightbearing for 2 weeks was discussed. Patient states he understands he states he wishes to proceed as soon as possible we [facility]  Follow-Up Instructions: Return in about 2 weeks (around 05/28/2016).   Orders:  No orders of the defined types were placed in this encounter.  No orders of the defined types were placed in this encounter.     Procedures: No procedures performed   Clinical Data: No additional findings.   Subjective: Chief Complaint  Patient presents with  . Right Foot - Follow-up    Right transmet amputation with chronic ulceration    Patient is here for right transmetatarsal amputation with chronic ulceration. There is foul smelling odor. There is drainage. There is persistent swelling and pain. He is pending revision surgery with gastrocnemius recession. He has completed oral antibiotics.     Review of Systems   Objective: Vital Signs: Ht 6\' 1"  (1.854 m)   Wt 239 lb (108.4 kg)   BMI 31.53 kg/m   Physical Exam on examination patient is alert oriented no adenopathy well-dressed normal affect normal respiratory effort he does have an antalgic gait. Examination the right foot has a massive callus and ulceration on the forefoot. There is no ascending cellulitis no necrotic changes he does have a foul-smelling clear drainage.  This does not probe to bone. After informed consent a 10 blade knife was used to debride the skin and soft tissue back to bleeding viable granulation tissue. The ulcer was 6 cm x 5 cm x 3 mm deep  Ortho Exam  Specialty Comments:  No specialty comments available.  Imaging: No results found.   PMFS History: Patient Active Problem List   Diagnosis Date Noted  . Acquired absence of right foot (HCC) 05/14/2016  . Polysubstance abuse 03/08/2016  . Tobacco abuse 02/27/2016  . Gastroparesis 05/28/2015  . GERD (gastroesophageal reflux disease) 10/01/2014  . Right foot infection 10/01/2014  . Osteomyelitis of foot, right, acute (HCC) 10/01/2014  . Esophageal reflux   . Fever 09/27/2014  . Abdominal pain, lower 09/27/2014  . Abnormal ECG 05/30/2014  . Chest pain 05/30/2014  . Ankle pain 05/30/2014  . Pain in the chest   . Nausea 08/30/2013  . Epigastric abdominal pain 08/30/2013  . Low grade fever 08/30/2013  . Diabetic foot ulcer with osteomyelitis (HCC) 05/12/2013  . Osteomyelitis (HCC) 05/12/2013  . Diabetic osteomyelitis b/l toes 03/23/2013  . DM (diabetes mellitus) type II uncontrolled, periph vascular disorder (HCC) 03/23/2013  . CKD (chronic kidney disease), stage III 03/23/2013  . Anemia 03/23/2013  . Benign essential HTN 03/23/2013  . Right foot ulcer, limited to breakdown of skin (HCC) 03/22/2013  . AKI (acute kidney injury) (HCC) 03/22/2013  . Viral gastroenteritis 09/17/2012  .  Nausea & vomiting 09/16/2012  . Acute pancreatitis 09/16/2012  . Diabetes mellitus (HCC) 09/20/2010  . Onychomycosis 09/20/2010  . METHICILLIN SUSCEPTIBLE STAPH AUREUS SEPTICEMIA 07/30/2010  . ACUTE OSTEOMYELITIS, ANKLE AND FOOT 07/30/2010   Past Medical History:  Diagnosis Date  . Acute osteomyelitis, ankle and foot 07/30/2010   Qualifier: Diagnosis of  By: Daiva EvesVan Dam MD, Remi Haggardornelius    . Anemia   . Chronic kidney disease (CKD), stage III (moderate)   . Collagen vascular disease (HCC)   . DDD  (degenerative disc disease), lumbar   . Gastroparesis   . GERD (gastroesophageal reflux disease)   . Hiatal hernia   . Hyperlipidemia   . Hypertension   . Pancreatitis   . Peripheral vascular disease (HCC)   . Polysubstance abuse 03/08/2016  . Renal insufficiency   . Type II diabetes mellitus (HCC) dx'd ~ 1996  . Vascular disease    poor circulation to left foot    Family History  Problem Relation Age of Onset  . Heart attack Father 5352  . Hypertension Sister     Past Surgical History:  Procedure Laterality Date  . AMPUTATION  04/25/2011   Procedure: AMPUTATION DIGIT;  Surgeon: Nadara MustardMarcus V Duda, MD;  [facility];  Service: Orthopedics;  Laterality: Left;  Left foot 3rd toe amputation MTP joint, Gastroc Recession  Achilles Lengthening   . AMPUTATION Bilateral 03/25/2013   Procedure: AMPUTATION RAY;  Surgeon: Nadara MustardMarcus V Duda, MD;  Location: [facility] OR;  Service: Orthopedics;  Laterality: Bilateral;  Left Great Toe Amputation at  MTP Joint, Right 1st and 2nd Ray Amputation   . AMPUTATION Right 10/03/2014   Procedure: AMPUTATION MIDFOOT;  Surgeon: Nadara MustardMarcus Duda V, MD;  [facility];  Service: Orthopedics;  Laterality: Right;  . LAPAROSCOPIC CHOLECYSTECTOMY    . TOE AMPUTATION  2012   left foot; great toe and second toe   Social History   Occupational History  . Not on file.   Social History Main Topics  . Smoking status: Current Every Day Smoker    Packs/day: 0.10    Years: 4.00    Types: Cigarettes  . Smokeless tobacco: Never Used  . Alcohol use No  . Drug use:     Frequency: 10.0 times per week    Types: Marijuana  . Sexual activity: Yes    Birth control/ protection: None

## 2016-05-15 ENCOUNTER — Other Ambulatory Visit (INDEPENDENT_AMBULATORY_CARE_PROVIDER_SITE_OTHER): Payer: Self-pay | Admitting: Family

## 2016-05-21 ENCOUNTER — Other Ambulatory Visit: Payer: Self-pay

## 2016-05-21 NOTE — Patient Outreach (Signed)
[facility] Care Management  05/21/2016  [name] 11/12/1975 [id number]   Telephone call to patient for high ED utilization referral.  No answer. Unable to leave a message.    Plan: RN Health Coach will attempt patient again in the next ten business days.    Bary Lericheionne J Maudine Kluesner, RN, MSN [facility] Care Management RN Telephonic Health Coach [phone]

## 2016-05-22 ENCOUNTER — Encounter (HOSPITAL_COMMUNITY): Payer: Self-pay | Admitting: *Deleted

## 2016-05-22 NOTE — Progress Notes (Signed)
   05/22/16 1146  OBSTRUCTIVE SLEEP APNEA  Have you ever been diagnosed with sleep apnea through a sleep study? No  Do you snore loudly (loud enough to be heard through closed doors)?  1  Do you often feel tired, fatigued, or sleepy during the daytime (such as falling asleep during driving or talking to someone)? 0  Has anyone observed you stop breathing during your sleep? 1  Do you have, or are you being treated for high blood pressure? 1  BMI more than 35 kg/m2? 0  Age > 50 (1-yes) 0  Neck circumference greater than:Male 16 inches or larger, Male 17inches or larger? 1  Male Gender (Yes=1) 1  Obstructive Sleep Apnea Score 5

## 2016-05-22 NOTE — Progress Notes (Signed)
Pt denies SOB, chest pain, and being under the care of a cardiologist. Pt denies having a cardiac cath and echo. Pt denies having a chest x ray within the last year. Pt stated that fasting blood glucose ranges in the 150's. Pt made aware of Diabetes Protocol to take 50% of Lantus insulin tonight ( 7 units instead of 15),  check blood glucose (BG) every 2 hours prior to arrival, interventions for a BG <70 and > 220 ( pt does not have a SS). Pt made aware to stop taking vitamins, fish oil and herbal medications. Do not take any NSAIDs ie: Ibuprofen, Advil, Naproxen BC and Goody Powder. Pt verbalized understanding of all pre-op instructions. Pt chart to be reviewed by anesthestia for abnormal EKG.

## 2016-05-22 NOTE — Progress Notes (Signed)
Anesthesia Chart Review: SAME DAY WORK-UP.  Patient is a 40 year old male scheduled for revision right transmetatarsal amputation, right gastrocnemius recession on 05/23/2016 by Dr. Lajoyce Cornersuda.  History includes smoking, HTN, PVD, collagen vascular disease (not specified), anemia, hiatal hernia, GERD, pancreatitis, HLD, CKI stage III, polysubstance abuse (tobacco, marijuana), DM, gastroparesis, left third toe amputation 04/25/11, left great toe amputation with right first and second toe ray amputation 03/25/13, right mid foot amputation 10/03/14.   PCP is Dr. Fleet ContrasEdwin Avbuere.  He is not routinely followed by a cardiologist, but saw Dr. Cristal Deerhristopher End during 05/2014 hospitalization for leg swelling and atypical chest pain. Patient had a non-ischemic stress test.  Med list currently includes Neurontin, Lantus, lisinopril, Reglan, Zofran.    03/04/16 EKG: NSR, consider LVH. ST elevation, consider early repolarization. Early repolarization pattern seen on prior tracings. He denied SOB, chest pain. Denied prior cath or echo.   05/30/14 Nuclear stress test: IMPRESSION: 1. No reversible ischemia or infarction. 2. Normal left ventricular wall motion. 3. Left ventricular ejection fraction 56%  05/07/16 DG ABD ACUTE w/CHEST: IMPRESSION: No acute findings. No active cardiopulmonary disease.  Labs from 05/13/16 noted. Cr 1.85. Patient with known CKD--Cr range of 1.44-2.30 since 01/2015; primarily 1.60-1.70's range.  K 3.4. Glucose 213. H/H 13.4/38.9.  He reported fasting CBGs in the 150's. (Last A1c in Epic is from 05/28/15, 6.4%.). He will get a fasting CBG on arrival.  Patient is a same day workup. He'll be further evaluated by his anesthesiologist on the day of surgery to discuss the definitive anesthesia plan.  Curtis Ochsllison Deyonte Cadden, PA-C [facility]iology Phone [phone] 05/22/2016 12:43 PM

## 2016-05-23 ENCOUNTER — Encounter (HOSPITAL_COMMUNITY): Payer: Self-pay | Admitting: Surgery

## 2016-05-23 ENCOUNTER — Ambulatory Visit (HOSPITAL_COMMUNITY): Payer: PPO | Admitting: Vascular Surgery

## 2016-05-23 ENCOUNTER — Encounter (HOSPITAL_COMMUNITY)
Admission: RE | Disposition: A | Discharge status: Home / Self Care | Payer: Self-pay | Source: Ambulatory Visit | Attending: Orthopedic Surgery

## 2016-05-23 ENCOUNTER — Ambulatory Visit (HOSPITAL_COMMUNITY)
Admission: RE | Admit: 2016-05-23 | Discharge: 2016-05-23 | Disposition: A | Discharge status: Home / Self Care | Payer: PPO | Source: Ambulatory Visit | Attending: Orthopedic Surgery | Admitting: Orthopedic Surgery

## 2016-05-23 DIAGNOSIS — T8131XA Disruption of external operation (surgical) wound, not elsewhere classified, initial encounter: Secondary | ICD-10-CM | POA: Diagnosis not present

## 2016-05-23 DIAGNOSIS — E1151 Type 2 diabetes mellitus with diabetic peripheral angiopathy without gangrene: Secondary | ICD-10-CM | POA: Insufficient documentation

## 2016-05-23 DIAGNOSIS — I129 Hypertensive chronic kidney disease with stage 1 through stage 4 chronic kidney disease, or unspecified chronic kidney disease: Secondary | ICD-10-CM | POA: Diagnosis not present

## 2016-05-23 DIAGNOSIS — N183 Chronic kidney disease, stage 3 (moderate): Secondary | ICD-10-CM | POA: Insufficient documentation

## 2016-05-23 DIAGNOSIS — Z89422 Acquired absence of other left toe(s): Secondary | ICD-10-CM | POA: Diagnosis not present

## 2016-05-23 DIAGNOSIS — M869 Osteomyelitis, unspecified: Secondary | ICD-10-CM | POA: Insufficient documentation

## 2016-05-23 DIAGNOSIS — Z794 Long term (current) use of insulin: Secondary | ICD-10-CM | POA: Insufficient documentation

## 2016-05-23 DIAGNOSIS — K3184 Gastroparesis: Secondary | ICD-10-CM | POA: Diagnosis not present

## 2016-05-23 DIAGNOSIS — E11621 Type 2 diabetes mellitus with foot ulcer: Secondary | ICD-10-CM

## 2016-05-23 DIAGNOSIS — E1143 Type 2 diabetes mellitus with diabetic autonomic (poly)neuropathy: Secondary | ICD-10-CM | POA: Insufficient documentation

## 2016-05-23 DIAGNOSIS — L97509 Non-pressure chronic ulcer of other part of unspecified foot with unspecified severity: Secondary | ICD-10-CM | POA: Diagnosis not present

## 2016-05-23 DIAGNOSIS — Y835 Amputation of limb(s) as the cause of abnormal reaction of the patient, or of later complication, without mention of misadventure at the time of the procedure: Secondary | ICD-10-CM | POA: Diagnosis not present

## 2016-05-23 DIAGNOSIS — L97516 Non-pressure chronic ulcer of other part of right foot with bone involvement without evidence of necrosis: Secondary | ICD-10-CM | POA: Diagnosis not present

## 2016-05-23 DIAGNOSIS — Z79899 Other long term (current) drug therapy: Secondary | ICD-10-CM | POA: Insufficient documentation

## 2016-05-23 DIAGNOSIS — E1122 Type 2 diabetes mellitus with diabetic chronic kidney disease: Secondary | ICD-10-CM | POA: Diagnosis not present

## 2016-05-23 DIAGNOSIS — T8781 Dehiscence of amputation stump: Secondary | ICD-10-CM | POA: Diagnosis not present

## 2016-05-23 DIAGNOSIS — E1140 Type 2 diabetes mellitus with diabetic neuropathy, unspecified: Secondary | ICD-10-CM | POA: Insufficient documentation

## 2016-05-23 DIAGNOSIS — Z89412 Acquired absence of left great toe: Secondary | ICD-10-CM | POA: Diagnosis not present

## 2016-05-23 DIAGNOSIS — M6701 Short Achilles tendon (acquired), right ankle: Secondary | ICD-10-CM | POA: Diagnosis not present

## 2016-05-23 DIAGNOSIS — F17210 Nicotine dependence, cigarettes, uncomplicated: Secondary | ICD-10-CM | POA: Diagnosis not present

## 2016-05-23 DIAGNOSIS — Z89431 Acquired absence of right foot: Secondary | ICD-10-CM | POA: Insufficient documentation

## 2016-05-23 DIAGNOSIS — E1169 Type 2 diabetes mellitus with other specified complication: Secondary | ICD-10-CM | POA: Insufficient documentation

## 2016-05-23 HISTORY — DX: Headache: R51

## 2016-05-23 HISTORY — PX: STUMP REVISION: SHX6102

## 2016-05-23 HISTORY — DX: Dehiscence of amputation stump: T87.81

## 2016-05-23 HISTORY — DX: Headache, unspecified: R51.9

## 2016-05-23 LAB — GLUCOSE, CAPILLARY
GLUCOSE-CAPILLARY: 122 mg/dL — AB (ref 65–99)
Glucose-Capillary: 107 mg/dL — ABNORMAL HIGH (ref 65–99)

## 2016-05-23 SURGERY — REVISION, AMPUTATION SITE
Anesthesia: General | Site: Foot | Laterality: Right

## 2016-05-23 MED ORDER — FENTANYL CITRATE (PF) 100 MCG/2ML IJ SOLN
INTRAMUSCULAR | Status: DC | PRN
  Administered 2016-05-23: 50 ug via INTRAVENOUS

## 2016-05-23 MED ORDER — CHLORHEXIDINE GLUCONATE 4 % EX LIQD: 60.0000 mL | Freq: Once | CUTANEOUS | Status: DC

## 2016-05-23 MED ORDER — CEFAZOLIN SODIUM-DEXTROSE 2-4 GM/100ML-% IV SOLN
2.0000 g | INTRAVENOUS | Status: AC
  Administered 2016-05-23: 2 g via INTRAVENOUS
  Filled 2016-05-23: qty 100

## 2016-05-23 MED ORDER — ONDANSETRON HCL 4 MG/2ML IJ SOLN
INTRAMUSCULAR | Status: DC | PRN
  Administered 2016-05-23: 4 mg via INTRAVENOUS

## 2016-05-23 MED ORDER — OXYCODONE-ACETAMINOPHEN 5-325 MG PO TABS: 1.0000 | ORAL_TABLET | ORAL | 0 refills | Status: DC | PRN

## 2016-05-23 MED ORDER — MIDAZOLAM HCL 2 MG/2ML IJ SOLN
INTRAMUSCULAR | Status: AC
  Filled 2016-05-23: qty 2

## 2016-05-23 MED ORDER — ONDANSETRON HCL 4 MG/2ML IJ SOLN
INTRAMUSCULAR | Status: AC
  Filled 2016-05-23: qty 4

## 2016-05-23 MED ORDER — BUPIVACAINE HCL (PF) 0.5 % IJ SOLN
INTRAMUSCULAR | Status: AC
  Filled 2016-05-23: qty 10

## 2016-05-23 MED ORDER — FENTANYL CITRATE (PF) 100 MCG/2ML IJ SOLN
INTRAMUSCULAR | Status: AC
  Filled 2016-05-23: qty 2

## 2016-05-23 MED ORDER — PROPOFOL 10 MG/ML IV BOLUS
INTRAVENOUS | Status: AC
  Filled 2016-05-23: qty 20

## 2016-05-23 MED ORDER — 0.9 % SODIUM CHLORIDE (POUR BTL) OPTIME
TOPICAL | Status: DC | PRN
  Administered 2016-05-23: 1000 mL

## 2016-05-23 MED ORDER — HYDROMORPHONE HCL 1 MG/ML IJ SOLN: 0.2500 mg | INTRAMUSCULAR | Status: DC | PRN

## 2016-05-23 MED ORDER — LIDOCAINE 2% (20 MG/ML) 5 ML SYRINGE
INTRAMUSCULAR | Status: DC | PRN
  Administered 2016-05-23: 100 mg via INTRAVENOUS

## 2016-05-23 MED ORDER — PHENYLEPHRINE 40 MCG/ML (10ML) SYRINGE FOR IV PUSH (FOR BLOOD PRESSURE SUPPORT)
PREFILLED_SYRINGE | INTRAVENOUS | Status: AC
  Filled 2016-05-23: qty 20

## 2016-05-23 MED ORDER — PROPOFOL 10 MG/ML IV BOLUS
INTRAVENOUS | Status: DC | PRN
  Administered 2016-05-23: 200 mg via INTRAVENOUS

## 2016-05-23 MED ORDER — MIDAZOLAM HCL 5 MG/5ML IJ SOLN
INTRAMUSCULAR | Status: DC | PRN
  Administered 2016-05-23: 2 mg via INTRAVENOUS

## 2016-05-23 MED ORDER — LIDOCAINE 2% (20 MG/ML) 5 ML SYRINGE
INTRAMUSCULAR | Status: AC
  Filled 2016-05-23: qty 10

## 2016-05-23 MED ORDER — LACTATED RINGERS IV SOLN
INTRAVENOUS | Status: DC
Start: 2016-05-23 — End: 2016-05-23
  Administered 2016-05-23: 12:00:00 via INTRAVENOUS

## 2016-05-23 SURGICAL SUPPLY — 46 items
BLADE SAW RECIP 87.9 MT (BLADE) IMPLANT
BLADE SURG 15 STRL LF DISP TIS (BLADE) IMPLANT
BLADE SURG 15 STRL SS (BLADE) ×3
BLADE SURG 21 STRL SS (BLADE) ×3 IMPLANT
BNDG COHESIVE 4X5 TAN STRL (GAUZE/BANDAGES/DRESSINGS) ×2 IMPLANT
BNDG COHESIVE 6X5 TAN STRL LF (GAUZE/BANDAGES/DRESSINGS) ×3 IMPLANT
BNDG GAUZE ELAST 4 BULKY (GAUZE/BANDAGES/DRESSINGS) ×2 IMPLANT
COVER SURGICAL LIGHT HANDLE (MISCELLANEOUS) ×3 IMPLANT
DRAPE EXTREMITY T 121X128X90 (DRAPE) ×2 IMPLANT
DRAPE HALF SHEET 40X57 (DRAPES) ×5 IMPLANT
DRAPE INCISE IOBAN 66X45 STRL (DRAPES) IMPLANT
DRAPE PROXIMA HALF (DRAPES) ×3 IMPLANT
DRAPE U-SHAPE 47X51 STRL (DRAPES) ×6 IMPLANT
DRSG ADAPTIC 3X8 NADH LF (GAUZE/BANDAGES/DRESSINGS) ×2 IMPLANT
DRSG PAD ABDOMINAL 8X10 ST (GAUZE/BANDAGES/DRESSINGS) ×6 IMPLANT
DURAPREP 26ML APPLICATOR (WOUND CARE) ×3 IMPLANT
ELECT REM PT RETURN 9FT ADLT (ELECTROSURGICAL) ×3
ELECTRODE REM PT RTRN 9FT ADLT (ELECTROSURGICAL) ×1 IMPLANT
GAUZE SPONGE 4X4 12PLY STRL (GAUZE/BANDAGES/DRESSINGS) ×3 IMPLANT
GLOVE BIOGEL PI IND STRL 9 (GLOVE) ×1 IMPLANT
GLOVE BIOGEL PI INDICATOR 9 (GLOVE) ×2
GLOVE SURG ORTHO 9.0 STRL STRW (GLOVE) ×3 IMPLANT
GOWN STRL REUS W/ TWL XL LVL3 (GOWN DISPOSABLE) ×2 IMPLANT
GOWN STRL REUS W/TWL XL LVL3 (GOWN DISPOSABLE) ×6
KIT BASIN OR (CUSTOM PROCEDURE TRAY) ×3 IMPLANT
KIT ROOM TURNOVER OR (KITS) ×3 IMPLANT
MANIFOLD NEPTUNE II (INSTRUMENTS) ×3 IMPLANT
NS IRRIG 1000ML POUR BTL (IV SOLUTION) ×3 IMPLANT
PACK GENERAL/GYN (CUSTOM PROCEDURE TRAY) ×3 IMPLANT
PAD ABD 8X10 STRL (GAUZE/BANDAGES/DRESSINGS) ×3 IMPLANT
PAD ARMBOARD 7.5X6 YLW CONV (MISCELLANEOUS) ×3 IMPLANT
PAD NEG PRESSURE SENSATRAC (MISCELLANEOUS) IMPLANT
SPONGE GAUZE 4X4 12PLY STER LF (GAUZE/BANDAGES/DRESSINGS) ×2 IMPLANT
SPONGE LAP 18X18 X RAY DECT (DISPOSABLE) IMPLANT
STAPLER VISISTAT 35W (STAPLE) IMPLANT
STOCKINETTE 6  STRL (DRAPES) ×2
STOCKINETTE 6 STRL (DRAPES) IMPLANT
STOCKINETTE IMPERVIOUS LG (DRAPES) IMPLANT
SUT ETHILON 2 0 PSLX (SUTURE) ×10 IMPLANT
SUT PDS AB 1 CT  36 (SUTURE)
SUT PDS AB 1 CT 36 (SUTURE) IMPLANT
SUT SILK 2 0 (SUTURE)
SUT SILK 2-0 18XBRD TIE 12 (SUTURE) IMPLANT
SUT VIC AB 1 CTX 36 (SUTURE)
SUT VIC AB 1 CTX36XBRD ANBCTR (SUTURE) IMPLANT
TOWEL OR 17X26 10 PK STRL BLUE (TOWEL DISPOSABLE) ×3 IMPLANT

## 2016-05-23 NOTE — Progress Notes (Signed)
Orthopedic Tech Progress Note Patient Details:  [name] 04/26/1976 [id number]  Ortho Devices Type of Ortho Device: Crutches, Darco shoe Ortho Device/Splint Location: Provided Crutches as requested by nurse. Applied Darco shoe (Post op shoe) to right foot. Pt tolerated application well.  Ortho Device/Splint Interventions: Application, Adjustment   Alvina ChouWilliams, Remigio Mcmillon C 05/23/2016, 2:46 PM

## 2016-05-23 NOTE — Anesthesia Preprocedure Evaluation (Addendum)
Anesthesia Evaluation  Patient identified by MRN, date of birth, ID band Patient awake    Reviewed: Allergy & Precautions, H&P , Patient's Chart, lab work & pertinent test results, reviewed documented beta blocker date and time   Airway Mallampati: III  TM Distance: >3 FB Neck ROM: Limited    Dental no notable dental hx.    Pulmonary Current Smoker,    Pulmonary exam normal breath sounds clear to auscultation       Cardiovascular hypertension,  Rhythm:regular Rate:Normal     Neuro/Psych    GI/Hepatic   Endo/Other  diabetes, Type 1, Insulin Dependent  Renal/GU CRF     Musculoskeletal   Abdominal   Peds  Hematology   Anesthesia Other Findings NSR; LVH HTN CRI  Reproductive/Obstetrics                            Anesthesia Physical Anesthesia Plan  ASA: II  Anesthesia Plan: Spinal   Post-op Pain Management:    Induction:   Airway Management Planned: Mask  Additional Equipment:   Intra-op Plan:   Post-operative Plan:   Informed Consent: I have reviewed the patients History and Physical, chart, labs and discussed the procedure including the risks, benefits and alternatives for the proposed anesthesia with the patient or authorized representative who has indicated his/her understanding and acceptance.   Dental Advisory Given and Dental advisory given  Plan Discussed with: CRNA and Surgeon  Anesthesia Plan Comments: (Discussed SAB and if necessary; GA with LMA, possible sore throat, potential need to switch to ETT, N/V, pulmonary aspiration. Questions answered.  Pt confirms no anticoagulants)       Anesthesia Quick Evaluation

## 2016-05-23 NOTE — Transfer of Care (Signed)
Immediate Anesthesia Transfer of Care Note  Patient: [name]  Procedure(s) Performed: Procedure(s): Revision Right Transmetatarsal Amputation, Right Gastrocnemius Recession (Right)  Patient Location: PACU  Anesthesia Type:General  Level of Consciousness: awake, alert  and oriented  Airway & Oxygen Therapy: Patient Spontanous Breathing and Patient connected to face mask oxygen  Post-op Assessment: Report given to RN and Post -op Vital signs reviewed and stable  Post vital signs: Reviewed and stable  Last Vitals:  Vitals:   05/23/16 1410 05/23/16 1421  BP: 140/90 (!) 134/93  Pulse: 71 77  Resp: 16 18  Temp:      Last Pain:  Vitals:   05/23/16 1421  TempSrc:   PainSc: 0-No pain      Patients Stated Pain Goal: 8 (05/23/16 1121)  Complications: No apparent anesthesia complications

## 2016-05-23 NOTE — H&P (Signed)
[name] is an 40 y.o. male.   Chief Complaint: Dehiscence right transmetatarsal amputation HPI: Patient is a 40 year old gentleman with diabetic insensate neuropathy peripheral vascular disease who has had dehiscence of his transmetatarsal amputation is failed prolonged conservative wound care and presents at this time for revision of the amputation and gastrocnemius recession.  Past Medical History:  Diagnosis Date  . Acute osteomyelitis, ankle and foot 07/30/2010   Qualifier: Diagnosis of  By: Daiva EvesVan Dam MD, Remi Haggardornelius    . Anemia   . Chronic kidney disease (CKD), stage III (moderate)   . Collagen vascular disease (HCC)   . DDD (degenerative disc disease), lumbar   . Dehiscence of amputation stump (HCC)     dehiscence right transmetetarsal amputation achilles contracture  . Gastroparesis   . GERD (gastroesophageal reflux disease)   . Headache   . Hiatal hernia   . Hyperlipidemia   . Hypertension   . Pancreatitis   . Peripheral vascular disease (HCC)   . Polysubstance abuse 03/08/2016  . Renal insufficiency   . Type II diabetes mellitus (HCC) dx'd ~ 1996  . Vascular disease    poor circulation to left foot    Past Surgical History:  Procedure Laterality Date  . AMPUTATION  04/25/2011   Procedure: AMPUTATION DIGIT;  Surgeon: Nadara MustardMarcus V Duda, MD;  [facility];  Service: Orthopedics;  Laterality: Left;  Left foot 3rd toe amputation MTP joint, Gastroc Recession  Achilles Lengthening   . AMPUTATION Bilateral 03/25/2013   Procedure: AMPUTATION RAY;  Surgeon: Nadara MustardMarcus V Duda, MD;  Location: [facility] OR;  Service: Orthopedics;  Laterality: Bilateral;  Left Great Toe Amputation at  MTP Joint, Right 1st and 2nd Ray Amputation   . AMPUTATION Right 10/03/2014   Procedure: AMPUTATION MIDFOOT;  Surgeon: Nadara MustardMarcus Duda V, MD;  [facility];  Service: Orthopedics;  Laterality: Right;  . LAPAROSCOPIC CHOLECYSTECTOMY    . TOE AMPUTATION  2012   left foot; great toe and second toe    Family History   Problem Relation Age of Onset  . Heart attack Father 4752  . Hypertension Sister    Social History:  reports that he has been smoking Cigarettes.  He has a 0.40 pack-year smoking history. He has never used smokeless tobacco. He reports that he uses drugs, including Marijuana, about 10 times per week. He reports that he does not drink alcohol.  Allergies:  Allergies  Allergen Reactions  . No Known Allergies     No prescriptions prior to admission.    No results found for this or any previous visit (from the past 48 hour(s)). No results found.  Review of Systems  All other systems reviewed and are negative.   There were no vitals taken for this visit. Physical Exam examination patient has (no adenopathy well-dressed normal affect are strictly if he does have antalgic gait he has a chronic ulcer over the transmetatarsal amputation this does not probe to bone there is no cellulitis no odor no drainage no signs of infection.  Assessment/Plan Assessment: Diabetic insensate neuropathy peripheral vascular disease with dehiscence of the right transmetatarsal amputation with heel cord contracture.  Plan: We'll plan for revision of the transmetatarsal amputation a gastrocnemius recession to unload the forefoot risk and benefits were discussed including infection neurovascular injury nonhealing the wound and additional surgery. Patient states he understands wishes to proceed at this time.  Nadara MustardMarcus V Duda, MD 05/23/2016, 6:51 AM

## 2016-05-23 NOTE — Anesthesia Procedure Notes (Signed)
Procedure Name: LMA Insertion Date/Time: 05/23/2016 1:09 PM Performed by: Arlice ColtMANESS, Timmie Calix B Pre-anesthesia Checklist: Patient identified, Emergency Drugs available, Suction available, Patient being monitored and Timeout performed Patient Re-evaluated:Patient Re-evaluated prior to inductionOxygen Delivery Method: Circle system utilized Preoxygenation: Pre-oxygenation with 100% oxygen Intubation Type: IV induction LMA: LMA inserted LMA Size: 5.0 Number of attempts: 1 Placement Confirmation: positive ETCO2 and breath sounds checked- equal and bilateral Tube secured with: Tape Dental Injury: Teeth and Oropharynx as per pre-operative assessment

## 2016-05-23 NOTE — Op Note (Signed)
     Date of Surgery: 05/23/2016  INDICATIONS: [name] is a 40 y.o.-year-old male who has chronic ulceration right forefoot with involvement of bone skin soft tissue and muscle.. With associated heel cord contracture  PREOPERATIVE DIAGNOSIS: Heel cord contracture with ulceration osteomyelitis transmetatarsal dictation on the right.  POSTOPERATIVE DIAGNOSIS: Same.  PROCEDURE: Transmetatarsal amputation right foot with gastrocnemius recession   SURGEON: Lajoyce Cornersuda, [name]  ANESTHESIA:  general  IV FLUIDS AND URINE: See anesthesia.  ESTIMATED BLOOD LOSS: Minimal mL.  COMPLICATIONS: None.  DESCRIPTION OF PROCEDURE: The patient was brought to the operating room and underwent a general anesthetic. After adequate levels of anesthesia were obtained patient's lower extremity was prepped using DuraPrep draped into a sterile field. A timeout was called.  A fishmouth incision was made just proximal to the ulcerative nonviable tissue. This was carried sharply down to bone. A oscillating saw was used to perform a transmetatarsal amputation with a gentle cascade of the metatarsals and beveled plantarly. Electrocautery was used for hemostasis. The wound was irrigated with normal saline. The incision was closed using 2-0 nylon. A longitudinal medial incision was made on the 15 cm proximal to the medial malleolus. Blunt dissection was carried down to the gastroc fascia and the gastroc fascia was released this brought his dorsiflexion from about 10 short of neutral to about 20 past neutral. Incision was closed using 2-0 nylon. A sterile compressive dressing was applied.. Patient was taken to the PACU in stable condition.  Aldean BakerMarcus Hershall Benkert, MD [facility] 1:34 PM

## 2016-05-26 ENCOUNTER — Other Ambulatory Visit: Payer: Self-pay

## 2016-05-26 ENCOUNTER — Encounter (HOSPITAL_COMMUNITY): Payer: Self-pay | Admitting: Orthopedic Surgery

## 2016-05-26 NOTE — Patient Outreach (Signed)
[facility] Care Management  05/26/2016  Dennie FettersLamont Heal 12/08/1975 [id number]  Telephone call to patient for referral of high ED utilization.  No answer.  HIPAA compliant voice message left.  Plan: RN Health Coach will attempt patient again in the next ten business days.    Bary Lericheionne J Namya Voges, RN, MSN [facility] Care Management RN Telephonic Health Coach [phone]

## 2016-05-26 NOTE — Anesthesia Postprocedure Evaluation (Signed)
Anesthesia Post Note  Patient: [name]  Procedure(s) Performed: Procedure(s) (LRB): Revision Right Transmetatarsal Amputation, Right Gastrocnemius Recession (Right)  Patient location during evaluation: PACU Anesthesia Type: General Level of consciousness: awake and alert Pain management: pain level controlled Vital Signs Assessment: post-procedure vital signs reviewed and stable Respiratory status: spontaneous breathing, nonlabored ventilation, respiratory function stable and patient connected to nasal cannula oxygen Cardiovascular status: blood pressure returned to baseline and stable Postop Assessment: no signs of nausea or vomiting Anesthetic complications: no       Last Vitals:  Vitals:   05/23/16 1410 05/23/16 1421  BP: 140/90 (!) 134/93  Pulse: 71 77  Resp: 16 18  Temp:      Last Pain:  Vitals:   05/23/16 1421  TempSrc:   PainSc: 0-No pain                 Zuhair Lariccia EDWARD

## 2016-05-29 ENCOUNTER — Other Ambulatory Visit: Payer: Self-pay

## 2016-05-29 NOTE — Patient Outreach (Signed)
[facility] Care Management  05/29/2016  Dennie FettersLamont Dinh 07/26/1975 [id number]   3rd telephone call to patient for high ED utilization referral.  Gave name and where I was from patient immediately states he is fine and ended call.  Plan: RN Health Coach will send patient letter and brochure.  Bary Lericheionne J Deardra Hinkley, RN, MSN [facility] Care [facility] RN Telephonic Health Coach [phone]

## 2016-06-04 ENCOUNTER — Other Ambulatory Visit (INDEPENDENT_AMBULATORY_CARE_PROVIDER_SITE_OTHER): Payer: Self-pay | Admitting: *Deleted

## 2016-06-04 NOTE — Telephone Encounter (Signed)
Pt needs refill of percocet

## 2016-06-05 ENCOUNTER — Telehealth (INDEPENDENT_AMBULATORY_CARE_PROVIDER_SITE_OTHER): Payer: Self-pay | Admitting: Orthopedic Surgery

## 2016-06-05 MED ORDER — OXYCODONE-ACETAMINOPHEN 5-325 MG PO TABS: 1.0000 | ORAL_TABLET | ORAL | 0 refills | Status: DC | PRN

## 2016-06-05 NOTE — Telephone Encounter (Signed)
Called pt to advise rx is ready for pick up.  

## 2016-06-06 ENCOUNTER — Ambulatory Visit (INDEPENDENT_AMBULATORY_CARE_PROVIDER_SITE_OTHER): Payer: PPO | Admitting: Family

## 2016-06-06 DIAGNOSIS — Z89431 Acquired absence of right foot: Secondary | ICD-10-CM

## 2016-06-06 DIAGNOSIS — M6701 Short Achilles tendon (acquired), right ankle: Secondary | ICD-10-CM

## 2016-06-06 NOTE — Progress Notes (Signed)
Office Visit Note   Patient: [name]           Date of Birth: 07/21/1975           MRN: [id number] Visit Date: 06/06/2016              Requested by: Curtis ContrasEdwin Avbuere, Clark [address] [place], [place] [id number] PCP: Curtis GermanEdwin A Avbuere, Clark   Assessment & Plan: Visit Diagnoses:  1. S/P transmetatarsal amputation of foot, right (HCC)   2. Heel cord tightness, right     Plan: He will continue nonweightbearing with crutches. Cleanse wound daily apply dry dressings. Work on dorsiflexion and ankle range of motion.  Follow-Up Instructions: Return in about 2 weeks (around 06/20/2016).   Orders:  No orders of the defined types were placed in this encounter.  No orders of the defined types were placed in this encounter.     Procedures: No procedures performed   Clinical Data: No additional findings.   Subjective: Chief Complaint  Patient presents with  . Right Foot - Routine Post Op    Is a 40 year old gentleman who is 2 weeks status post transmetatarsal amputation right foot as well as gastroc recession. Incision to the calf is clean and dry. Patient has no concerns. Has been nonweightbearing with crutches.    Review of Systems  Constitutional: Negative for chills and fever.     Objective: Vital Signs: There were no vitals taken for this visit.  Physical Exam  Constitutional: He is oriented to person, place, and time. He appears well-developed and well-nourished.  Pulmonary/Chest: Effort normal.  Musculoskeletal:  Right transmetatarsal amputation is healing well. There is a little gaping centrally. This does not probe. There is no drainage no surrounding erythema no sign of infection. The right calf incision is well healed. Sutures were harvested from the calf.  Neurological: He is alert and oriented to person, place, and time.  Psychiatric: He has a normal mood and affect.  Nursing note reviewed.   Ortho Exam  Specialty Comments:  No specialty comments  available.  Imaging: No results found.   PMFS History: Patient Active Problem List   Diagnosis Date Noted  . Acquired contracture of Achilles tendon, right   . Acquired absence of right foot (HCC) 05/14/2016  . Polysubstance abuse 03/08/2016  . Tobacco abuse 02/27/2016  . Gastroparesis 05/28/2015  . GERD (gastroesophageal reflux disease) 10/01/2014  . Right foot infection 10/01/2014  . Osteomyelitis of foot, right, acute (HCC) 10/01/2014  . Esophageal reflux   . Fever 09/27/2014  . Abdominal pain, lower 09/27/2014  . Abnormal ECG 05/30/2014  . Chest pain 05/30/2014  . Ankle pain 05/30/2014  . Pain in the chest   . Nausea 08/30/2013  . Epigastric abdominal pain 08/30/2013  . Low grade fever 08/30/2013  . Diabetic foot ulcer with osteomyelitis (HCC) 05/12/2013  . Osteomyelitis (HCC) 05/12/2013  . Diabetic osteomyelitis b/l toes 03/23/2013  . DM (diabetes mellitus) type II uncontrolled, periph vascular disorder (HCC) 03/23/2013  . CKD (chronic kidney disease), stage III 03/23/2013  . Anemia 03/23/2013  . Benign essential HTN 03/23/2013  . Right foot ulcer, limited to breakdown of skin (HCC) 03/22/2013  . AKI (acute kidney injury) (HCC) 03/22/2013  . Viral gastroenteritis 09/17/2012  . Nausea & vomiting 09/16/2012  . Acute pancreatitis 09/16/2012  . Diabetes mellitus (HCC) 09/20/2010  . Onychomycosis 09/20/2010  . METHICILLIN SUSCEPTIBLE STAPH AUREUS SEPTICEMIA 07/30/2010  . ACUTE OSTEOMYELITIS, ANKLE AND FOOT 07/30/2010   Past Medical History:  Diagnosis Date  . Acute osteomyelitis, ankle and foot 07/30/2010   Qualifier: Diagnosis of  By: [name]    . Anemia   . Chronic kidney disease (CKD), stage III (moderate)   . Collagen vascular disease (HCC)   . DDD (degenerative disc disease), lumbar   . Dehiscence of amputation stump (HCC)     dehiscence right transmetetarsal amputation achilles contracture  . Gastroparesis   . GERD (gastroesophageal reflux  disease)   . Headache   . Hiatal hernia   . Hyperlipidemia   . Hypertension   . Pancreatitis   . Peripheral vascular disease (HCC)   . Polysubstance abuse 03/08/2016  . Renal insufficiency   . Type II diabetes mellitus (HCC) dx'd ~ 1996  . Vascular disease    poor circulation to left foot    Family History  Problem Relation Age of Onset  . Heart attack Father [id number]  . Hypertension Sister     Past Surgical History:  Procedure Laterality Date  . AMPUTATION  04/25/2011   Procedure: AMPUTATION DIGIT;  Surgeon: Curtis MustardMarcus Clark Duda, Clark;  [facility];  Service: Orthopedics;  Laterality: Left;  Left foot 3rd toe amputation MTP joint, Gastroc Recession  Achilles Lengthening   . AMPUTATION Bilateral 03/25/2013   Procedure: AMPUTATION RAY;  Surgeon: Curtis MustardMarcus Clark Duda, Clark;  Location: [facility] OR;  Service: Orthopedics;  Laterality: Bilateral;  Left Great Toe Amputation at  MTP Joint, Right 1st and 2nd Ray Amputation   . AMPUTATION Right 10/03/2014   Procedure: AMPUTATION MIDFOOT;  Surgeon: Curtis MustardMarcus Clark Clark, Clark;  [facility];  Service: Orthopedics;  Laterality: Right;  . LAPAROSCOPIC CHOLECYSTECTOMY    . STUMP REVISION Right 05/23/2016   Procedure: Revision Right Transmetatarsal Amputation, Right Gastrocnemius Recession;  Surgeon: Curtis MustardMarcus Clark Duda, Clark;  Location: [facility] OR;  Service: Orthopedics;  Laterality: Right;  . TOE AMPUTATION  2012   left foot; great toe and second toe   Social History   Occupational History  . Not on file.   Social History Main Topics  . Smoking status: Current Some Day Smoker    Packs/day: 0.10    Years: 4.00    Types: Cigarettes  . Smokeless tobacco: Never Used  . Alcohol use No  . Drug use:     Frequency: 10.0 times per week    Types: Marijuana  . Sexual activity: Yes    Birth control/ protection: None

## 2016-06-17 ENCOUNTER — Telehealth (INDEPENDENT_AMBULATORY_CARE_PROVIDER_SITE_OTHER): Payer: Self-pay | Admitting: Orthopedic Surgery

## 2016-06-17 ENCOUNTER — Other Ambulatory Visit (INDEPENDENT_AMBULATORY_CARE_PROVIDER_SITE_OTHER): Payer: Self-pay | Admitting: Orthopedic Surgery

## 2016-06-17 MED ORDER — OXYCODONE-ACETAMINOPHEN 5-325 MG PO TABS: 1.0000 | ORAL_TABLET | ORAL | 0 refills | Status: DC | PRN

## 2016-06-17 NOTE — Telephone Encounter (Signed)
Pt 1 month out transmet amputation. He had a qty of 120 Percocet 5/325 in the past month. Please advise.

## 2016-06-17 NOTE — Telephone Encounter (Signed)
Prescription written for Percocet. 

## 2016-06-17 NOTE — Telephone Encounter (Signed)
Called and sw pt to advise rx is ready for pick up

## 2016-06-20 ENCOUNTER — Ambulatory Visit (INDEPENDENT_AMBULATORY_CARE_PROVIDER_SITE_OTHER): Payer: PPO | Admitting: Orthopedic Surgery

## 2016-06-24 ENCOUNTER — Encounter (INDEPENDENT_AMBULATORY_CARE_PROVIDER_SITE_OTHER): Payer: Self-pay

## 2016-06-24 ENCOUNTER — Encounter (INDEPENDENT_AMBULATORY_CARE_PROVIDER_SITE_OTHER): Payer: Self-pay | Admitting: Family

## 2016-06-24 ENCOUNTER — Ambulatory Visit (INDEPENDENT_AMBULATORY_CARE_PROVIDER_SITE_OTHER): Payer: PPO | Admitting: Family

## 2016-06-24 VITALS — Ht 73.0 in | Wt 239.0 lb

## 2016-06-24 DIAGNOSIS — Z89431 Acquired absence of right foot: Secondary | ICD-10-CM

## 2016-06-24 DIAGNOSIS — T8131XA Disruption of external operation (surgical) wound, not elsewhere classified, initial encounter: Secondary | ICD-10-CM

## 2016-06-24 DIAGNOSIS — M6701 Short Achilles tendon (acquired), right ankle: Secondary | ICD-10-CM

## 2016-06-24 MED ORDER — OXYCODONE-ACETAMINOPHEN 5-325 MG PO TABS: 1.0000 | ORAL_TABLET | Freq: Four times a day (QID) | ORAL | 0 refills | Status: DC | PRN

## 2016-06-24 MED ORDER — SULFAMETHOXAZOLE-TRIMETHOPRIM 800-160 MG PO TABS: 1.0000 | ORAL_TABLET | Freq: Two times a day (BID) | ORAL | 0 refills | Status: DC

## 2016-06-24 MED ORDER — SILVER SULFADIAZINE 1 % EX CREA: 1.0000 "application " | TOPICAL_CREAM | Freq: Every day | CUTANEOUS | 0 refills | Status: DC

## 2016-06-24 NOTE — Progress Notes (Signed)
Office Visit Note   Patient: [name]           Date of Birth: 01/29/1976           MRN: [id number] Visit Date: 06/24/2016              Requested by: Fleet ContrasEdwin Avbuere, MD [address] [place], [place] [id number] PCP: Dorrene GermanEdwin A Avbuere, MD  Chief Complaint  Patient presents with  . Right Foot - Wound Check    Revision Right Transmetatarsal Amputation, Right Gastrocnemius Recession 05/23/16 32 days post op.     HPI: Patient presents today with increased pain right foot, status post transmetatarsal revision surgery. He complains of chills today, denies fever, denies nausea. He complains of increased in bleeding from incision. He is status post fall from crutches while trying to navigate stairs at his apartment. Since then has tried to ambulate with cane and is weightbearing on right foot. There is slight odor. He is not currently taking any pain medication.     Assessment & Plan: Visit Diagnoses:  1. Dehiscence of operative wound, initial encounter   2. Acquired contracture of Achilles tendon, right   3. Acquired absence of right foot (HCC)     Plan: Have provided him with a prescription for Silvadene. He will pack the wound open with Silvadene and gauze. Weightbearing as little as possible may weight-bear through the heel for transfers. Reinforced the importance nonweightbearing for wound healing. Have provided prescriptions for pain medication as well as antibiotics. He will follow up in office in 2 weeks. Discussed return precautions to present to the ED or the office for fevers chills or ascending cellulitis, purulence.  Follow-Up Instructions: Return in about 4 weeks (around 07/22/2016).   Ortho Exam Right foot status post transmetatarsal amputation. The incision has dehisced. It has been 4 weeks today. Sutures were harvested at this time this does not probe to bone today. The wound bed is 15 mm deep. There is no drainage today. There is foul odor. no surrounding erythema or  cellulitis.  Imaging: No results found.  Orders:  No orders of the defined types were placed in this encounter.  Meds ordered this encounter  Medications  . sulfamethoxazole-trimethoprim (BACTRIM DS,SEPTRA DS) 800-160 MG tablet    Sig: Take 1 tablet by mouth 2 (two) times daily.    Dispense:  28 tablet    Refill:  0  . oxyCODONE-acetaminophen (PERCOCET/ROXICET) 5-325 MG tablet    Sig: Take 1 tablet by mouth every 6 (six) hours as needed for severe pain.    Dispense:  45 tablet    Refill:  0  . silver sulfADIAZINE (SILVADENE) 1 % cream    Sig: Apply 1 application topically daily.    Dispense:  50 g    Refill:  0     Procedures: No procedures performed  Clinical Data: No additional findings.  Subjective: Review of Systems  Constitutional: Positive for chills. Negative for appetite change and fever.  Skin: Positive for wound. Negative for color change.    Objective: Vital Signs: Ht 6\' 1"  (1.854 m)   Wt 239 lb (108.4 kg)   BMI 31.53 kg/m   Specialty Comments:  No specialty comments available.  PMFS History: Patient Active Problem List   Diagnosis Date Noted  . Acquired contracture of Achilles tendon, right   . Acquired absence of right foot (HCC) 05/14/2016  . Polysubstance abuse 03/08/2016  . Tobacco abuse 02/27/2016  . Gastroparesis 05/28/2015  . GERD (gastroesophageal reflux  disease) 10/01/2014  . Right foot infection 10/01/2014  . Osteomyelitis of foot, right, acute (HCC) 10/01/2014  . Esophageal reflux   . Fever 09/27/2014  . Abdominal pain, lower 09/27/2014  . Abnormal ECG 05/30/2014  . Chest pain 05/30/2014  . Ankle pain 05/30/2014  . Pain in the chest   . Nausea 08/30/2013  . Epigastric abdominal pain 08/30/2013  . Low grade fever 08/30/2013  . Diabetic foot ulcer with osteomyelitis (HCC) 05/12/2013  . Osteomyelitis (HCC) 05/12/2013  . Diabetic osteomyelitis b/l toes 03/23/2013  . DM (diabetes mellitus) type II uncontrolled, periph vascular  disorder (HCC) 03/23/2013  . CKD (chronic kidney disease), stage III 03/23/2013  . Anemia 03/23/2013  . Benign essential HTN 03/23/2013  . Right foot ulcer, limited to breakdown of skin (HCC) 03/22/2013  . AKI (acute kidney injury) (HCC) 03/22/2013  . Viral gastroenteritis 09/17/2012  . Nausea & vomiting 09/16/2012  . Acute pancreatitis 09/16/2012  . Diabetes mellitus (HCC) 09/20/2010  . Onychomycosis 09/20/2010  . METHICILLIN SUSCEPTIBLE STAPH AUREUS SEPTICEMIA 07/30/2010  . ACUTE OSTEOMYELITIS, ANKLE AND FOOT 07/30/2010   Past Medical History:  Diagnosis Date  . Acute osteomyelitis, ankle and foot 07/30/2010   Qualifier: Diagnosis of  By: Daiva Eves MD, Remi Haggard    . Anemia   . Chronic kidney disease (CKD), stage III (moderate)   . Collagen vascular disease (HCC)   . DDD (degenerative disc disease), lumbar   . Dehiscence of amputation stump (HCC)     dehiscence right transmetetarsal amputation achilles contracture  . Gastroparesis   . GERD (gastroesophageal reflux disease)   . Headache   . Hiatal hernia   . Hyperlipidemia   . Hypertension   . Pancreatitis   . Peripheral vascular disease (HCC)   . Polysubstance abuse 03/08/2016  . Renal insufficiency   . Type II diabetes mellitus (HCC) dx'd ~ 1996  . Vascular disease    poor circulation to left foot    Family History  Problem Relation Age of Onset  . Heart attack Father 55  . Hypertension Sister     Past Surgical History:  Procedure Laterality Date  . AMPUTATION  04/25/2011   Procedure: AMPUTATION DIGIT;  Surgeon: Nadara Mustard, MD;  Location: [facility] OR;  Service: Orthopedics;  Laterality: Left;  Left foot 3rd toe amputation MTP joint, Gastroc Recession  Achilles Lengthening   . AMPUTATION Bilateral 03/25/2013   Procedure: AMPUTATION RAY;  Surgeon: Nadara Mustard, MD;  Location: [facility] OR;  Service: Orthopedics;  Laterality: Bilateral;  Left Great Toe Amputation at  MTP Joint, Right 1st and 2nd Ray Amputation   . AMPUTATION Right  10/03/2014   Procedure: AMPUTATION MIDFOOT;  Surgeon: Nadara Mustard, MD;  Location: [facility] OR;  Service: Orthopedics;  Laterality: Right;  . LAPAROSCOPIC CHOLECYSTECTOMY    . STUMP REVISION Right 05/23/2016   Procedure: Revision Right Transmetatarsal Amputation, Right Gastrocnemius Recession;  Surgeon: Nadara Mustard, MD;  Location: [facility] OR;  Service: Orthopedics;  Laterality: Right;  . TOE AMPUTATION  2012   left foot; great toe and second toe   Social History   Occupational History  . Not on file.   Social History Main Topics  . Smoking status: Current Some Day Smoker    Packs/day: 0.10    Years: 4.00    Types: Cigarettes  . Smokeless tobacco: Never Used  . Alcohol use No  . Drug use:     Frequency: 10.0 times per week    Types: Marijuana  . Sexual  activity: Yes    Birth control/ protection: None

## 2016-06-30 ENCOUNTER — Ambulatory Visit (INDEPENDENT_AMBULATORY_CARE_PROVIDER_SITE_OTHER): Payer: PPO | Admitting: Orthopedic Surgery

## 2016-07-04 ENCOUNTER — Encounter (HOSPITAL_COMMUNITY): Payer: Self-pay | Admitting: Emergency Medicine

## 2016-07-04 ENCOUNTER — Emergency Department (HOSPITAL_COMMUNITY)
Admission: EM | Admit: 2016-07-04 | Discharge: 2016-07-04 | Disposition: A | Discharge status: Home / Self Care | Payer: PPO | Attending: Emergency Medicine | Admitting: Emergency Medicine

## 2016-07-04 ENCOUNTER — Emergency Department (HOSPITAL_COMMUNITY): Payer: PPO

## 2016-07-04 DIAGNOSIS — F17210 Nicotine dependence, cigarettes, uncomplicated: Secondary | ICD-10-CM | POA: Insufficient documentation

## 2016-07-04 DIAGNOSIS — I129 Hypertensive chronic kidney disease with stage 1 through stage 4 chronic kidney disease, or unspecified chronic kidney disease: Secondary | ICD-10-CM | POA: Insufficient documentation

## 2016-07-04 DIAGNOSIS — Z4801 Encounter for change or removal of surgical wound dressing: Secondary | ICD-10-CM | POA: Diagnosis not present

## 2016-07-04 DIAGNOSIS — G8918 Other acute postprocedural pain: Secondary | ICD-10-CM | POA: Insufficient documentation

## 2016-07-04 DIAGNOSIS — Z794 Long term (current) use of insulin: Secondary | ICD-10-CM | POA: Insufficient documentation

## 2016-07-04 DIAGNOSIS — Z5189 Encounter for other specified aftercare: Secondary | ICD-10-CM

## 2016-07-04 DIAGNOSIS — E1122 Type 2 diabetes mellitus with diabetic chronic kidney disease: Secondary | ICD-10-CM | POA: Diagnosis not present

## 2016-07-04 DIAGNOSIS — Z09 Encounter for follow-up examination after completed treatment for conditions other than malignant neoplasm: Secondary | ICD-10-CM | POA: Diagnosis not present

## 2016-07-04 DIAGNOSIS — M79671 Pain in right foot: Secondary | ICD-10-CM | POA: Insufficient documentation

## 2016-07-04 DIAGNOSIS — S98911A Complete traumatic amputation of right foot, level unspecified, initial encounter: Secondary | ICD-10-CM | POA: Diagnosis not present

## 2016-07-04 DIAGNOSIS — N183 Chronic kidney disease, stage 3 (moderate): Secondary | ICD-10-CM | POA: Diagnosis not present

## 2016-07-04 LAB — COMPREHENSIVE METABOLIC PANEL
ALBUMIN: 3.6 g/dL (ref 3.5–5.0)
ALK PHOS: 87 U/L (ref 38–126)
ALT: 17 U/L (ref 17–63)
ANION GAP: 8 (ref 5–15)
AST: 21 U/L (ref 15–41)
BUN: 19 mg/dL (ref 6–20)
CALCIUM: 9.5 mg/dL (ref 8.9–10.3)
CHLORIDE: 103 mmol/L (ref 101–111)
CO2: 24 mmol/L (ref 22–32)
CREATININE: 2.05 mg/dL — AB (ref 0.61–1.24)
GFR calc Af Amer: 45 mL/min — ABNORMAL LOW (ref >60)
GFR calc non Af Amer: 39 mL/min — ABNORMAL LOW (ref >60)
GLUCOSE: 168 mg/dL — AB (ref 65–99)
Potassium: 4.9 mmol/L (ref 3.5–5.1)
SODIUM: 135 mmol/L (ref 135–145)
Total Bilirubin: 0.6 mg/dL (ref 0.3–1.2)
Total Protein: 8.1 g/dL (ref 6.5–8.1)

## 2016-07-04 LAB — CBC WITH DIFFERENTIAL/PLATELET
Basophils Absolute: 0 10*3/uL (ref 0.0–0.1)
Basophils Relative: 0 %
EOS ABS: 0.2 10*3/uL (ref 0.0–0.7)
Eosinophils Relative: 3 %
HCT: 39.1 % (ref 39.0–52.0)
HEMOGLOBIN: 13.2 g/dL (ref 13.0–17.0)
LYMPHS ABS: 1.9 10*3/uL (ref 0.7–4.0)
LYMPHS PCT: 24 %
MCH: 30.5 pg (ref 26.0–34.0)
MCHC: 33.8 g/dL (ref 30.0–36.0)
MCV: 90.3 fL (ref 78.0–100.0)
MONOS PCT: 6 %
Monocytes Absolute: 0.5 10*3/uL (ref 0.1–1.0)
NEUTROS PCT: 67 %
Neutro Abs: 5.4 10*3/uL (ref 1.7–7.7)
Platelets: 238 10*3/uL (ref 150–400)
RBC: 4.33 MIL/uL (ref 4.22–5.81)
RDW: 13.7 % (ref 11.5–15.5)
WBC: 8 10*3/uL (ref 4.0–10.5)

## 2016-07-04 LAB — SEDIMENTATION RATE: SED RATE: 53 mm/h — AB (ref 0–16)

## 2016-07-04 LAB — I-STAT CG4 LACTIC ACID, ED: Lactic Acid, Venous: 1.1 mmol/L (ref 0.5–1.9)

## 2016-07-04 LAB — C-REACTIVE PROTEIN: CRP: 0.9 mg/dL (ref <1.0)

## 2016-07-04 MED ORDER — OXYCODONE-ACETAMINOPHEN 5-325 MG PO TABS
2.0000 | ORAL_TABLET | Freq: Once | ORAL | Status: AC
  Administered 2016-07-04: 2 via ORAL
  Filled 2016-07-04: qty 2

## 2016-07-04 NOTE — ED Triage Notes (Signed)
Pt states he recently had toes amputated on right foot. He has had multiple surgeries on this foot and infections. Recent Surgery was a month ago. Pt states his foot has been bleeding, having purulent drainage, and doesn't appear to be healing.

## 2016-07-04 NOTE — ED Provider Notes (Signed)
[facility]-EMERGENCY DEPT Provider Note   CSN: [id number] Arrival date & time: 07/04/16  0818     History   Chief Complaint Chief Complaint  Patient presents with  . Post-op Problem    HPI Quention Mcneill is a 41 y.o. male with insulin-dependent diabetes. 2. Diabetes mellitus. History of left toe pain medications and a recent transmetatarsal amputation of the right foot presents emergency Department with chief complaint of poor healing. Patient states that he is seen. The PA at Dr. Audrie Lia practice. He said he probably needs further amputation, however, has not been able follow up with Dr. Lajoyce Corners himself. He complains of persistent pain, foul odor, drainage from the foot. He has had associated chills without fever.  HPI  Past Medical History:  Diagnosis Date  . Acute osteomyelitis, ankle and foot 07/30/2010   Qualifier: Diagnosis of  By: Daiva Eves MD, Remi Haggard    . Anemia   . Chronic kidney disease (CKD), stage III (moderate)   . Collagen vascular disease (HCC)   . DDD (degenerative disc disease), lumbar   . Dehiscence of amputation stump (HCC)     dehiscence right transmetetarsal amputation achilles contracture  . Gastroparesis   . GERD (gastroesophageal reflux disease)   . Headache   . Hiatal hernia   . Hyperlipidemia   . Hypertension   . Pancreatitis   . Peripheral vascular disease (HCC)   . Polysubstance abuse 03/08/2016  . Renal insufficiency   . Type II diabetes mellitus (HCC) dx'd ~ 1996  . Vascular disease    poor circulation to left foot    Patient Active Problem List   Diagnosis Date Noted  . Acquired contracture of Achilles tendon, right   . Acquired absence of right foot (HCC) 05/14/2016  . Polysubstance abuse 03/08/2016  . Tobacco abuse 02/27/2016  . Gastroparesis 05/28/2015  . GERD (gastroesophageal reflux disease) 10/01/2014  . Right foot infection 10/01/2014  . Osteomyelitis of foot, right, acute (HCC) 10/01/2014  . Esophageal reflux   . Fever 09/27/2014  .  Abdominal pain, lower 09/27/2014  . Abnormal ECG 05/30/2014  . Chest pain 05/30/2014  . Ankle pain 05/30/2014  . Pain in the chest   . Nausea 08/30/2013  . Epigastric abdominal pain 08/30/2013  . Low grade fever 08/30/2013  . Diabetic foot ulcer with osteomyelitis (HCC) 05/12/2013  . Osteomyelitis (HCC) 05/12/2013  . Diabetic osteomyelitis b/l toes 03/23/2013  . DM (diabetes mellitus) type II uncontrolled, periph vascular disorder (HCC) 03/23/2013  . CKD (chronic kidney disease), stage III 03/23/2013  . Anemia 03/23/2013  . Benign essential HTN 03/23/2013  . Right foot ulcer, limited to breakdown of skin (HCC) 03/22/2013  . AKI (acute kidney injury) (HCC) 03/22/2013  . Viral gastroenteritis 09/17/2012  . Nausea & vomiting 09/16/2012  . Acute pancreatitis 09/16/2012  . Diabetes mellitus (HCC) 09/20/2010  . Onychomycosis 09/20/2010  . METHICILLIN SUSCEPTIBLE STAPH AUREUS SEPTICEMIA 07/30/2010  . ACUTE OSTEOMYELITIS, ANKLE AND FOOT 07/30/2010    Past Surgical History:  Procedure Laterality Date  . AMPUTATION  04/25/2011   Procedure: AMPUTATION DIGIT;  Surgeon: Nadara Mustard, MD;  Location: [facility] OR;  Service: Orthopedics;  Laterality: Left;  Left foot 3rd toe amputation MTP joint, Gastroc Recession  Achilles Lengthening   . AMPUTATION Bilateral 03/25/2013   Procedure: AMPUTATION RAY;  Surgeon: Nadara Mustard, MD;  Location: [facility] OR;  Service: Orthopedics;  Laterality: Bilateral;  Left Great Toe Amputation at  MTP Joint, Right 1st and 2nd Ray Amputation   . AMPUTATION  Right 10/03/2014   Procedure: AMPUTATION MIDFOOT;  Surgeon: Nadara MustardMarcus Duda V, MD;  [facility];  Service: Orthopedics;  Laterality: Right;  . LAPAROSCOPIC CHOLECYSTECTOMY    . STUMP REVISION Right 05/23/2016   Procedure: Revision Right Transmetatarsal Amputation, Right Gastrocnemius Recession;  Surgeon: Nadara MustardMarcus V Duda, MD;  Location: [facility] OR;  Service: Orthopedics;  Laterality: Right;  . TOE AMPUTATION  2012   left foot; great  toe and second toe       Home Medications    Prior to Admission medications   Medication Sig Start Date End Date Taking? Authorizing Provider  gabapentin (NEURONTIN) 100 MG capsule Take 2 capsules (200 mg total) by mouth 3 (three) times daily. Patient taking differently: Take 200 mg by mouth 3 (three) times daily as needed (pain).  02/28/16  Yes Calvert CantorSaima Rizwan, MD  insulin aspart (NOVOLOG) 100 UNIT/ML injection Inject 3 Units into the skin daily.   Yes Historical Provider, MD  insulin glargine (LANTUS) 100 UNIT/ML injection Inject 0.15 mLs (15 Units total) into the skin at bedtime. 10/05/14  Yes Belkys A Regalado, MD  lisinopril (PRINIVIL,ZESTRIL) 10 MG tablet Take 10 mg by mouth every morning.    Yes Historical Provider, MD  silver sulfADIAZINE (SILVADENE) 1 % cream Apply 1 application topically daily. 06/24/16  Yes Adonis HugueninErin R Zamora, NP  sulfamethoxazole-trimethoprim (BACTRIM DS,SEPTRA DS) 800-160 MG tablet Take 1 tablet by mouth 2 (two) times daily. 06/24/16  Yes Adonis HugueninErin R Zamora, NP  metoCLOPramide (REGLAN) 10 MG tablet Take 1 tablet (10 mg total) by mouth every 6 (six) hours as needed for nausea or vomiting. Patient not taking: Reported on 07/04/2016 05/07/16   Fayrene HelperBowie Tran, PA-C  ondansetron (ZOFRAN ODT) 8 MG disintegrating tablet Take 1 tablet (8 mg total) by mouth every 8 (eight) hours as needed for nausea or vomiting. Patient not taking: Reported on 07/04/2016 05/09/16   Deatra CanterWilliam J Oxford, FNP  oxyCODONE-acetaminophen (PERCOCET/ROXICET) 5-325 MG tablet Take 1 tablet by mouth every 6 (six) hours as needed for severe pain. Patient not taking: Reported on 07/04/2016 06/24/16   Adonis HugueninErin R Zamora, NP    Family History Family History  Problem Relation Age of Onset  . Heart attack Father [id number]  . Hypertension Sister     Social History Social History  Substance Use Topics  . Smoking status: Current Some Day Smoker    Packs/day: 0.10    Years: 4.00    Types: Cigarettes  . Smokeless tobacco: Never Used  .  Alcohol use No     Allergies   No known allergies   Review of Systems Review of Systems  Ten systems reviewed and are negative for acute change, except as noted in the HPI.   Physical Exam Updated Vital Signs BP 138/89   Pulse 73   Temp 98.2 F (36.8 C) (Oral)   Resp 16   Ht 6\' 1"  (1.854 m)   Wt 106.6 kg   SpO2 100%   BMI 31.00 kg/m   Physical Exam  Constitutional: He appears well-developed and well-nourished. No distress.  HENT:  Head: Normocephalic and atraumatic.  Eyes: Conjunctivae are normal. No scleral icterus.  Neck: Normal range of motion. Neck supple.  Cardiovascular: Normal rate, regular rhythm and normal heart sounds.   Pulmonary/Chest: Effort normal and breath sounds normal. No respiratory distress.  Abdominal: Soft. There is no tenderness.  Musculoskeletal: He exhibits no edema.  Right foot, status post transmetatarsal amputation. Open ulceration at the site of amputation. No signs of foul odor or  discharge. Minimal swelling, moderately tender to palpation.  Neurological: He is alert.  Skin: Skin is warm and dry. He is not diaphoretic.  Psychiatric: His behavior is normal.  Nursing note and vitals reviewed.    ED Treatments / Results  Labs (all labs ordered are listed, but only abnormal results are displayed) Labs Reviewed  COMPREHENSIVE METABOLIC PANEL - Abnormal; Notable for the following:       Result Value   Glucose, Bld 168 (*)    Creatinine, Ser 2.05 (*)    GFR calc non Af Amer 39 (*)    GFR calc Af Amer 45 (*)    All other components within normal limits  CBC WITH DIFFERENTIAL/PLATELET  SEDIMENTATION RATE  C-REACTIVE PROTEIN  I-STAT CG4 LACTIC ACID, ED    EKG  EKG Interpretation None       Radiology Dg Foot Complete Right  Result Date: 07/04/2016 CLINICAL DATA:  Status post partial amputation. EXAM: RIGHT FOOT COMPLETE - 3+ VIEW COMPARISON:  02/27/2016 FINDINGS: The patient is status post forefoot amputation at the level of  the proximal metatarsal bones. There is a large soft tissue ulcer along the distal stump measuring 1.5 cm in depth. No definite underlying bone erosions identified. IMPRESSION: 1. Large stool tissue ulceration involving stump overlying forefoot amputation site. No definite underline bony destruction identified. Electronically Signed   By: Signa Kell [name]   On: 07/04/2016 11:19    Procedures Procedures (including critical care time)  Medications Ordered in ED Medications - No data to display   Initial Impression / Assessment and Plan / ED Course  I have reviewed the triage vital signs and the nursing notes.  Pertinent labs & imaging results that were available during my care of the patient were reviewed by me and considered in my medical decision making (see chart for details).   patient wound appears fairly well healing, although open. patient does have an elevated sedimentation rate, however, that is nonspecific. I do not think that reflects osteomyelitis in this patient. The patient is also followed by Dr. Lajoyce Corners does not appear to have any systemic signs of infection such as fever, elevated white count. No hypertension or tachycardia. The foot is mildly tender without foul odor or discharge.   Final  Clinical Impressions(s) / ED Diagnoses   Final diagnoses:  Post-operative pain  Visit for wound check    New Prescriptions New Prescriptions   No medications on file     Arthor Captain, PA-C 07/04/16 1728    Alvira Monday, MD 07/08/16 1659

## 2016-07-04 NOTE — ED Notes (Signed)
Patient transported to X-ray 

## 2016-07-04 NOTE — Discharge Instructions (Signed)
Get help right away if: You have pain or swelling that gets worse or does not go away. You have red streaks on your skin near your toes, foot, or leg. You have pain in your calf or behind your knee. You have shortness of breath. You have chest pain.

## 2016-07-09 ENCOUNTER — Telehealth (INDEPENDENT_AMBULATORY_CARE_PROVIDER_SITE_OTHER): Payer: Self-pay | Admitting: Orthopedic Surgery

## 2016-07-09 ENCOUNTER — Ambulatory Visit (INDEPENDENT_AMBULATORY_CARE_PROVIDER_SITE_OTHER): Payer: PPO | Admitting: Orthopedic Surgery

## 2016-07-09 ENCOUNTER — Encounter (INDEPENDENT_AMBULATORY_CARE_PROVIDER_SITE_OTHER): Payer: Self-pay

## 2016-07-09 NOTE — Telephone Encounter (Signed)
Rx request 

## 2016-07-09 NOTE — Telephone Encounter (Signed)
Rx refill Percocet °

## 2016-07-10 ENCOUNTER — Emergency Department (HOSPITAL_COMMUNITY)
Admission: EM | Admit: 2016-07-10 | Discharge: 2016-07-11 | Disposition: A | Discharge status: Home / Self Care | Payer: PPO | Attending: Emergency Medicine | Admitting: Emergency Medicine

## 2016-07-10 ENCOUNTER — Ambulatory Visit (INDEPENDENT_AMBULATORY_CARE_PROVIDER_SITE_OTHER): Payer: PPO | Admitting: Orthopedic Surgery

## 2016-07-10 ENCOUNTER — Encounter (INDEPENDENT_AMBULATORY_CARE_PROVIDER_SITE_OTHER): Payer: Self-pay | Admitting: Orthopedic Surgery

## 2016-07-10 ENCOUNTER — Encounter (HOSPITAL_COMMUNITY): Payer: Self-pay | Admitting: Emergency Medicine

## 2016-07-10 ENCOUNTER — Other Ambulatory Visit (INDEPENDENT_AMBULATORY_CARE_PROVIDER_SITE_OTHER): Payer: Self-pay | Admitting: Orthopedic Surgery

## 2016-07-10 VITALS — Ht 73.0 in | Wt 235.0 lb

## 2016-07-10 DIAGNOSIS — R4182 Altered mental status, unspecified: Secondary | ICD-10-CM | POA: Diagnosis not present

## 2016-07-10 DIAGNOSIS — R41843 Psychomotor deficit: Secondary | ICD-10-CM | POA: Insufficient documentation

## 2016-07-10 DIAGNOSIS — N183 Chronic kidney disease, stage 3 (moderate): Secondary | ICD-10-CM | POA: Insufficient documentation

## 2016-07-10 DIAGNOSIS — I129 Hypertensive chronic kidney disease with stage 1 through stage 4 chronic kidney disease, or unspecified chronic kidney disease: Secondary | ICD-10-CM | POA: Diagnosis not present

## 2016-07-10 DIAGNOSIS — R531 Weakness: Secondary | ICD-10-CM | POA: Diagnosis not present

## 2016-07-10 DIAGNOSIS — Y828 Other medical devices associated with adverse incidents: Secondary | ICD-10-CM | POA: Diagnosis not present

## 2016-07-10 DIAGNOSIS — Z89431 Acquired absence of right foot: Secondary | ICD-10-CM

## 2016-07-10 DIAGNOSIS — F17210 Nicotine dependence, cigarettes, uncomplicated: Secondary | ICD-10-CM | POA: Diagnosis not present

## 2016-07-10 DIAGNOSIS — F1910 Other psychoactive substance abuse, uncomplicated: Secondary | ICD-10-CM | POA: Insufficient documentation

## 2016-07-10 DIAGNOSIS — F1210 Cannabis abuse, uncomplicated: Secondary | ICD-10-CM | POA: Diagnosis not present

## 2016-07-10 DIAGNOSIS — E1142 Type 2 diabetes mellitus with diabetic polyneuropathy: Secondary | ICD-10-CM | POA: Diagnosis not present

## 2016-07-10 DIAGNOSIS — T8781 Dehiscence of amputation stump: Secondary | ICD-10-CM | POA: Diagnosis not present

## 2016-07-10 DIAGNOSIS — Z79899 Other long term (current) drug therapy: Secondary | ICD-10-CM | POA: Diagnosis not present

## 2016-07-10 DIAGNOSIS — R451 Restlessness and agitation: Secondary | ICD-10-CM

## 2016-07-10 DIAGNOSIS — L97529 Non-pressure chronic ulcer of other part of left foot with unspecified severity: Secondary | ICD-10-CM | POA: Diagnosis not present

## 2016-07-10 DIAGNOSIS — Z794 Long term (current) use of insulin: Secondary | ICD-10-CM | POA: Insufficient documentation

## 2016-07-10 DIAGNOSIS — F918 Other conduct disorders: Secondary | ICD-10-CM | POA: Diagnosis not present

## 2016-07-10 DIAGNOSIS — T8130XA Disruption of wound, unspecified, initial encounter: Secondary | ICD-10-CM | POA: Insufficient documentation

## 2016-07-10 HISTORY — DX: Acquired absence of right foot: Z89.431

## 2016-07-10 LAB — CBC WITH DIFFERENTIAL/PLATELET
BASOS PCT: 0 %
Basophils Absolute: 0 10*3/uL (ref 0.0–0.1)
Eosinophils Absolute: 0 10*3/uL (ref 0.0–0.7)
Eosinophils Relative: 0 %
HEMATOCRIT: 35.5 % — AB (ref 39.0–52.0)
Hemoglobin: 12.3 g/dL — ABNORMAL LOW (ref 13.0–17.0)
LYMPHS ABS: 0.9 10*3/uL (ref 0.7–4.0)
Lymphocytes Relative: 9 %
MCH: 30.1 pg (ref 26.0–34.0)
MCHC: 34.6 g/dL (ref 30.0–36.0)
MCV: 86.8 fL (ref 78.0–100.0)
MONOS PCT: 5 %
Monocytes Absolute: 0.6 10*3/uL (ref 0.1–1.0)
NEUTROS ABS: 9.2 10*3/uL — AB (ref 1.7–7.7)
NEUTROS PCT: 86 %
Platelets: 231 10*3/uL (ref 150–400)
RBC: 4.09 MIL/uL — AB (ref 4.22–5.81)
RDW: 13.4 % (ref 11.5–15.5)
WBC: 10.7 10*3/uL — AB (ref 4.0–10.5)

## 2016-07-10 LAB — COMPREHENSIVE METABOLIC PANEL
ALBUMIN: 4 g/dL (ref 3.5–5.0)
ALT: 19 U/L (ref 17–63)
ANION GAP: 13 (ref 5–15)
AST: 31 U/L (ref 15–41)
Alkaline Phosphatase: 94 U/L (ref 38–126)
BUN: 23 mg/dL — ABNORMAL HIGH (ref 6–20)
CO2: 19 mmol/L — AB (ref 22–32)
Calcium: 9.3 mg/dL (ref 8.9–10.3)
Chloride: 101 mmol/L (ref 101–111)
Creatinine, Ser: 2.55 mg/dL — ABNORMAL HIGH (ref 0.61–1.24)
GFR calc Af Amer: 35 mL/min — ABNORMAL LOW (ref >60)
GFR calc non Af Amer: 30 mL/min — ABNORMAL LOW (ref >60)
GLUCOSE: 236 mg/dL — AB (ref 65–99)
POTASSIUM: 4 mmol/L (ref 3.5–5.1)
SODIUM: 133 mmol/L — AB (ref 135–145)
TOTAL PROTEIN: 8.2 g/dL — AB (ref 6.5–8.1)
Total Bilirubin: 0.2 mg/dL — ABNORMAL LOW (ref 0.3–1.2)

## 2016-07-10 LAB — ETHANOL: Alcohol, Ethyl (B): <5 mg/dL (ref <5)

## 2016-07-10 LAB — TROPONIN I: Troponin I: <0.03 ng/mL (ref <0.03)

## 2016-07-10 MED ORDER — STERILE WATER FOR INJECTION IJ SOLN
INTRAMUSCULAR | Status: AC
  Filled 2016-07-10: qty 20

## 2016-07-10 MED ORDER — OXYCODONE-ACETAMINOPHEN 5-325 MG PO TABS: 1.0000 | ORAL_TABLET | Freq: Four times a day (QID) | ORAL | 0 refills | Status: DC | PRN

## 2016-07-10 MED ORDER — ZIPRASIDONE MESYLATE 20 MG IM SOLR
20.0000 mg | Freq: Once | INTRAMUSCULAR | Status: AC
  Administered 2016-07-10: 20 mg via INTRAMUSCULAR
  Filled 2016-07-10: qty 20

## 2016-07-10 MED ORDER — LORAZEPAM 2 MG/ML IJ SOLN
1.0000 mg | Freq: Once | INTRAMUSCULAR | Status: AC
  Administered 2016-07-10: 1 mg via INTRAVENOUS
  Filled 2016-07-10: qty 1

## 2016-07-10 MED ORDER — ACETAMINOPHEN 325 MG PO TABS
650.0000 mg | ORAL_TABLET | Freq: Once | ORAL | Status: AC
  Administered 2016-07-10: 650 mg via ORAL
  Filled 2016-07-10: qty 2

## 2016-07-10 MED ORDER — ACETAMINOPHEN 325 MG PO TABS
650.0000 mg | ORAL_TABLET | Freq: Once | ORAL | Status: DC
Start: 2016-07-10 — End: 2016-07-11

## 2016-07-10 MED ORDER — SODIUM CHLORIDE 0.9 % IV BOLUS (SEPSIS)
1000.0000 mL | Freq: Once | INTRAVENOUS | Status: AC
  Administered 2016-07-10: 1000 mL via INTRAVENOUS

## 2016-07-10 NOTE — Progress Notes (Signed)
Office Visit Note   Patient: [name]           Date of Birth: 06/01/1976           MRN: [id number] Visit Date: 07/10/2016              Requested by: Fleet ContrasEdwin Avbuere, MD [address] [place], [place] [id number] PCP: Dorrene GermanEdwin A Avbuere, MD  Chief Complaint  Patient presents with  . Right Leg - Wound Check    Revision Right Transmetatarsal Amputation, Right Gastrocnemius Recession 05/23/16    HPI: He was recently seen in the emergency department on 07/04/16 for persistent pain, foul odor, drainage from the foot. He has had associated chills without fever. He is doing the same today. He is not currently on any oral antibiotic treatment. There is fibrinous exudative tissue. Foot is warm up palpation. Patient complaining of pain and not currently taking any medication for this. Donalee CitrinStepheney L Peele, RT  Patient has progressive ischemic pain right transmetatarsal amputation.  Assessment & Plan: Visit Diagnoses:  1. Diabetic polyneuropathy associated with type 2 diabetes mellitus (HCC)   2. Status post transmetatarsal amputation of foot, right (HCC)     Plan: Discussed the patient recommendation to proceed with a transtibial amputation. Due to the progressive dehiscence of the transmetatarsal amputation despite strong dorsalis pedis pulse feel that his microcirculation is insufficient to consider any further foot salvage intervention. Patient should benefit from a transtibial amputation and be fully functional. Patient states that he does not want to consider further surgery this time due to moving and other social considerations. I will follow-up in 4 weeks. Discussed the importance of calling immediately if he has any increased odor drainage fever chills or cellulitis. Discussed the risk of waiting with progressive infection in the right lower extremity.  Follow-Up Instructions: Return in about 4 weeks (around 08/07/2016).   Ortho Exam Examination patient has progressive dehiscence of the  transmetatarsal amputation there is no abscess no cellulitis no drainage. Patient does have exposed bone.  Imaging: No results found.  Orders:  No orders of the defined types were placed in this encounter.  No orders of the defined types were placed in this encounter.    Procedures: No procedures performed  Clinical Data: No additional findings.  Subjective: Review of Systems  Objective: Vital Signs: Ht 6\' 1"  (1.854 m)   Wt 235 lb (106.6 kg)   BMI 31.00 kg/m   Specialty Comments:  No specialty comments available.  PMFS History: Patient Active Problem List   Diagnosis Date Noted  . Diabetic polyneuropathy associated with type 2 diabetes mellitus (HCC) 07/10/2016  . Status post transmetatarsal amputation of foot, right (HCC) 07/10/2016  . Acquired contracture of Achilles tendon, right   . Acquired absence of right foot (HCC) 05/14/2016  . Polysubstance abuse 03/08/2016  . Tobacco abuse 02/27/2016  . Gastroparesis 05/28/2015  . GERD (gastroesophageal reflux disease) 10/01/2014  . Right foot infection 10/01/2014  . Osteomyelitis of foot, right, acute (HCC) 10/01/2014  . Esophageal reflux   . Fever 09/27/2014  . Abdominal pain, lower 09/27/2014  . Abnormal ECG 05/30/2014  . Chest pain 05/30/2014  . Ankle pain 05/30/2014  . Pain in the chest   . Nausea 08/30/2013  . Epigastric abdominal pain 08/30/2013  . Low grade fever 08/30/2013  . Diabetic foot ulcer with osteomyelitis (HCC) 05/12/2013  . Osteomyelitis (HCC) 05/12/2013  . Diabetic osteomyelitis b/l toes 03/23/2013  . DM (diabetes mellitus) type II uncontrolled, periph vascular disorder (HCC)  03/23/2013  . CKD (chronic kidney disease), stage III 03/23/2013  . Anemia 03/23/2013  . Benign essential HTN 03/23/2013  . Right foot ulcer, limited to breakdown of skin (HCC) 03/22/2013  . AKI (acute kidney injury) (HCC) 03/22/2013  . Viral gastroenteritis 09/17/2012  . Nausea & vomiting 09/16/2012  . Acute  pancreatitis 09/16/2012  . Diabetes mellitus (HCC) 09/20/2010  . Onychomycosis 09/20/2010  . METHICILLIN SUSCEPTIBLE STAPH AUREUS SEPTICEMIA 07/30/2010  . ACUTE OSTEOMYELITIS, ANKLE AND FOOT 07/30/2010   Past Medical History:  Diagnosis Date  . Acute osteomyelitis, ankle and foot 07/30/2010   Qualifier: Diagnosis of  By: Daiva Eves MD, Remi Haggard    . Anemia   . Chronic kidney disease (CKD), stage III (moderate)   . Collagen vascular disease (HCC)   . DDD (degenerative disc disease), lumbar   . Dehiscence of amputation stump (HCC)     dehiscence right transmetetarsal amputation achilles contracture  . Gastroparesis   . GERD (gastroesophageal reflux disease)   . Headache   . Hiatal hernia   . Hyperlipidemia   . Hypertension   . Pancreatitis   . Peripheral vascular disease (HCC)   . Polysubstance abuse 03/08/2016  . Renal insufficiency   . Type II diabetes mellitus (HCC) dx'd ~ 1996  . Vascular disease    poor circulation to left foot    Family History  Problem Relation Age of Onset  . Heart attack Father 31  . Hypertension Sister     Past Surgical History:  Procedure Laterality Date  . AMPUTATION  04/25/2011   Procedure: AMPUTATION DIGIT;  Surgeon: Nadara Mustard, MD;  Location: [facility] OR;  Service: Orthopedics;  Laterality: Left;  Left foot 3rd toe amputation MTP joint, Gastroc Recession  Achilles Lengthening   . AMPUTATION Bilateral 03/25/2013   Procedure: AMPUTATION RAY;  Surgeon: Nadara Mustard, MD;  Location: [facility] OR;  Service: Orthopedics;  Laterality: Bilateral;  Left Great Toe Amputation at  MTP Joint, Right 1st and 2nd Ray Amputation   . AMPUTATION Right 10/03/2014   Procedure: AMPUTATION MIDFOOT;  Surgeon: Nadara Mustard, MD;  Location[facility] OR;  Service: Orthopedics;  Laterality: Right;  . LAPAROSCOPIC CHOLECYSTECTOMY    . STUMP REVISION Right 05/23/2016   Procedure: Revision Right Transmetatarsal Amputation, Right Gastrocnemius Recession;  Surgeon: Nadara Mustard, MD;  Location:  [facility] OR;  Service: Orthopedics;  Laterality: Right;  . TOE AMPUTATION  2012   left foot; great toe and second toe   Social History   Occupational History  . Not on file.   Social History Main Topics  . Smoking status: Current Some Day Smoker    Packs/day: 0.10    Years: 4.00    Types: Cigarettes  . Smokeless tobacco: Never Used  . Alcohol use No  . Drug use: Yes    Frequency: 10.0 times per week    Types: Marijuana  . Sexual activity: Yes    Birth control/ protection: None
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
## 2016-07-10 NOTE — ED Notes (Signed)
Bed: RESB Expected date:  Expected time:  Means of arrival:  Comments: Room 27

## 2016-07-10 NOTE — Telephone Encounter (Signed)
Rx written.

## 2016-07-10 NOTE — ED Notes (Signed)
Bed: WA27 Expected date:  Expected time:  Means of arrival:  Comments: 41 yo M  Med clearance  With police

## 2016-07-10 NOTE — Telephone Encounter (Signed)
Given to patient while in the office for appointment today.
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
## 2016-07-11 ENCOUNTER — Emergency Department (HOSPITAL_COMMUNITY): Payer: PPO

## 2016-07-11 DIAGNOSIS — L97529 Non-pressure chronic ulcer of other part of left foot with unspecified severity: Secondary | ICD-10-CM | POA: Diagnosis not present

## 2016-07-11 MED ORDER — CEPHALEXIN 500 MG PO CAPS
500.0000 mg | ORAL_CAPSULE | Freq: Once | ORAL | Status: AC
  Administered 2016-07-11: 500 mg via ORAL
  Filled 2016-07-11: qty 1

## 2016-07-11 MED ORDER — SULFAMETHOXAZOLE-TRIMETHOPRIM 800-160 MG PO TABS
1.0000 | ORAL_TABLET | Freq: Once | ORAL | Status: AC
  Administered 2016-07-11: 1 via ORAL
  Filled 2016-07-11: qty 1

## 2016-07-11 MED ORDER — CEPHALEXIN 500 MG PO CAPS: 500.0000 mg | ORAL_CAPSULE | Freq: Four times a day (QID) | ORAL | 0 refills | Status: DC

## 2016-07-11 MED ORDER — SULFAMETHOXAZOLE-TRIMETHOPRIM 800-160 MG PO TABS: 1.0000 | ORAL_TABLET | Freq: Two times a day (BID) | ORAL | 0 refills | Status: DC

## 2016-07-11 NOTE — ED Notes (Signed)
  Patient signed out to me pending repeat ECG in vital signs. He initially was tachycardic and heart rate has come down. Repeat ECG shows no acute process. Normal heart rate. He was observed in the ED and was waking up satisfactorily from his sedatives. He is discharged back to jail with prescriptions for antibiotics-cephalexin and trimethoprim-sulfamethoxazole. Follow-up with his orthopedic surgeon for ongoing wound care management.   EKG Interpretation  Date/Time:  Friday July 11 2016 00:34:11 EST Ventricular Rate:  80 PR Interval:    QRS Duration: 90 QT Interval:  360 QTC Calculation: 416 R Axis:   5 Text Interpretation:  Sinus rhythm ST elev, probable normal early repol pattern When compared with ECG of 07/10/2016, HEART RATE has decreased Confirmed by Preston FleetingGLICK  MD, Antonisha Waskey ([id number]) on 07/11/2016 12:53:20 AM         Curtis Boozeavid Adelise Buswell, MD 07/11/16 [phone]

## 2016-07-11 NOTE — Discharge Instructions (Signed)
[name] has poorly healing wound after an amputation done on December 15th. He has a low grade temperature of 100.2 at arrival. All the other labs are reassuring.  [name] would have been discharged to home with bactrim and keflex and follow up with his Orthopedist in 1 week. Since he is going to be incarcerated, we request that he get the antibiotics prescribed and see his Orthopedist in 2 weeks.  Please return him to the ER if he has high fevers, chills, confusion or the wound start draining pus or patient has new leg redness / swelling.  Wound needs to be checked everyday and dressed daily with gauze and kerlex.

## 2016-07-24 ENCOUNTER — Ambulatory Visit (INDEPENDENT_AMBULATORY_CARE_PROVIDER_SITE_OTHER): Payer: PPO | Admitting: Orthopedic Surgery

## 2016-08-07 ENCOUNTER — Ambulatory Visit (INDEPENDENT_AMBULATORY_CARE_PROVIDER_SITE_OTHER): Payer: PPO | Admitting: Orthopedic Surgery

## 2016-08-07 ENCOUNTER — Encounter (INDEPENDENT_AMBULATORY_CARE_PROVIDER_SITE_OTHER): Payer: Self-pay | Admitting: Orthopedic Surgery

## 2016-08-07 VITALS — Ht 73.0 in | Wt 235.0 lb

## 2016-08-07 DIAGNOSIS — Z89431 Acquired absence of right foot: Secondary | ICD-10-CM

## 2016-08-07 DIAGNOSIS — L97512 Non-pressure chronic ulcer of other part of right foot with fat layer exposed: Secondary | ICD-10-CM | POA: Insufficient documentation

## 2016-08-07 NOTE — Progress Notes (Signed)
Office Visit Note   Patient: [name]           Date of Birth: 04-22-1976           MRN: [id number] Visit Date: 08/07/2016              Requested by: Fleet Contras, MD [address] [place], [place] [address] PCP: Dorrene German, MD  Chief Complaint  Patient presents with  . Right Foot - Follow-up    Revision Right Transmetatarsal Amputation, Right Gastrocnemius Recession 05/23/16      HPI: Patient is here for follow up of his right transmet amputation. Patient had a revision surgery in December and the incision remains open. There is a small amount of yellowish drain and there is an odor. The pt is full weightbearing with a darco shoe and applies a dry dressing and vive compression sock. The pt denies any fever, chills or sweats and states that he has completed his rx for Keflex. The pt was advised at his visit a month ago that he should proceed with a transtibial amputation. Patient states that he does not wish to proceed with surgery at this time. Rodena Medin, RMA    Assessment & Plan: Visit Diagnoses:  1. Status post transmetatarsal amputation of foot, right (HCC)   2. Non-pressure chronic ulcer of other part of right foot with fat layer exposed (HCC)     Plan: Patient is not interested in further surgery at this time. Otherwise feel it safe to continue with her conservative therapy we will have him continue with the Bactroban dressing changes continue with the Darco shoe Dial soap cleansing daily follow-up in the office in 4 weeks.  Follow-Up Instructions: Return in about 4 weeks (around 09/04/2016).   Ortho Exam Examination patient is alert oriented no adenopathy well-dressed normal affect ref that he does have an antalgic gait. Examination of the right foot there is a large open wound over the transtibial amputation there is no redness no cellulitis no odor no drainage no signs of infection. After informed consent a 10 blade knife was used to debride the skin  and soft tissue back to bleeding viable granulation tissue. After debridement the ulcer is 2 cm wide 6 cm long and 1 cm deep. There is beefy granulation tissue the base there is no exposed bone or tendon.  Imaging: No results found.  Labs: Lab Results  Component Value Date   HGBA1C 6.4 (H) 05/28/2015   HGBA1C 13.6 (H) 09/28/2014   HGBA1C 6.9 (H) 05/30/2014   ESRSEDRATE 53 (H) 07/04/2016   ESRSEDRATE 34 (H) 02/27/2016   ESRSEDRATE >140 (H) 10/01/2014   CRP 0.9 07/04/2016   CRP 1.8 (H) 02/27/2016   CRP 11.7 (H) 10/01/2014   REPTSTATUS 03/03/2016 FINAL 02/27/2016   GRAMSTAIN  07/22/2010    FEW WBC PRESENT,BOTH PMN AND MONONUCLEAR RARE SQUAMOUS EPITHELIAL CELLS PRESENT RARE GRAM POSITIVE COCCI IN PAIRS   GRAMSTAIN  07/22/2010    FEW WBC PRESENT,BOTH PMN AND MONONUCLEAR RARE SQUAMOUS EPITHELIAL CELLS PRESENT RARE GRAM POSITIVE COCCI IN PAIRS   CULT  02/27/2016    NO GROWTH 5 DAYS Performed at [facility]    [facility] STAPHYLOCOCCUS AUREUS 07/22/2010    Orders:  No orders of the defined types were placed in this encounter.  No orders of the defined types were placed in this encounter.    Procedures: No procedures performed  Clinical Data: No additional findings.  Subjective: Review of Systems  Objective: Vital Signs:  Ht 6\' 1"  (1.854 m)   Wt 235 lb (106.6 kg)   BMI 31.00 kg/m   Specialty Comments:  No specialty comments available.  PMFS History: Patient Active Problem List   Diagnosis Date Noted  . Non-pressure chronic ulcer of other part of right foot with fat layer exposed (HCC) 08/07/2016  . Diabetic polyneuropathy associated with type 2 diabetes mellitus (HCC) 07/10/2016  . Status post transmetatarsal amputation of foot, right (HCC) 07/10/2016  . Acquired contracture of Achilles tendon, right   . Acquired absence of right foot (HCC) 05/14/2016  . Polysubstance abuse 03/08/2016  . Tobacco abuse 02/27/2016  . Gastroparesis 05/28/2015  . GERD  (gastroesophageal reflux disease) 10/01/2014  . Right foot infection 10/01/2014  . Osteomyelitis of foot, right, acute (HCC) 10/01/2014  . Esophageal reflux   . Fever 09/27/2014  . Abdominal pain, lower 09/27/2014  . Abnormal ECG 05/30/2014  . Chest pain 05/30/2014  . Ankle pain 05/30/2014  . Pain in the chest   . Nausea 08/30/2013  . Epigastric abdominal pain 08/30/2013  . Low grade fever 08/30/2013  . Diabetic foot ulcer with osteomyelitis (HCC) 05/12/2013  . Osteomyelitis (HCC) 05/12/2013  . Diabetic osteomyelitis b/l toes 03/23/2013  . DM (diabetes mellitus) type II uncontrolled, periph vascular disorder (HCC) 03/23/2013  . CKD (chronic kidney disease), stage III 03/23/2013  . Anemia 03/23/2013  . Benign essential HTN 03/23/2013  . Right foot ulcer, limited to breakdown of skin (HCC) 03/22/2013  . AKI (acute kidney injury) (HCC) 03/22/2013  . Viral gastroenteritis 09/17/2012  . Nausea & vomiting 09/16/2012  . Acute pancreatitis 09/16/2012  . Diabetes mellitus (HCC) 09/20/2010  . Onychomycosis 09/20/2010  . METHICILLIN SUSCEPTIBLE STAPH AUREUS SEPTICEMIA 07/30/2010  . ACUTE OSTEOMYELITIS, ANKLE AND FOOT 07/30/2010   Past Medical History:  Diagnosis Date  . Acute osteomyelitis, ankle and foot 07/30/2010   Qualifier: Diagnosis of  By: Daiva EvesVan Dam MD, Remi Haggardornelius    . Anemia   . Chronic kidney disease (CKD), stage III (moderate)   . Collagen vascular disease (HCC)   . DDD (degenerative disc disease), lumbar   . Dehiscence of amputation stump (HCC)     dehiscence right transmetetarsal amputation achilles contracture  . Gastroparesis   . GERD (gastroesophageal reflux disease)   . Headache   . Hiatal hernia   . Hyperlipidemia   . Hypertension   . Pancreatitis   . Peripheral vascular disease (HCC)   . Polysubstance abuse 03/08/2016  . Renal insufficiency   . Type II diabetes mellitus (HCC) dx'd ~ 1996  . Vascular disease    poor circulation to left foot    Family History    Problem Relation Age of Onset  . Heart attack Father 5052  . Hypertension Sister     Past Surgical History:  Procedure Laterality Date  . AMPUTATION  04/25/2011   Procedure: AMPUTATION DIGIT;  Surgeon: Nadara MustardMarcus V Ramiro Pangilinan, MD;  [facility];  Service: Orthopedics;  Laterality: Left;  Left foot 3rd toe amputation MTP joint, Gastroc Recession  Achilles Lengthening   . AMPUTATION Bilateral 03/25/2013   Procedure: AMPUTATION RAY;  Surgeon: Nadara MustardMarcus V Mathews Stuhr, MD;  Location: [facility] OR;  Service: Orthopedics;  Laterality: Bilateral;  Left Great Toe Amputation at  MTP Joint, Right 1st and 2nd Ray Amputation   . AMPUTATION Right 10/03/2014   Procedure: AMPUTATION MIDFOOT;  Surgeon: Nadara MustardMarcus Ellina Sivertsen V, MD;  [facility];  Service: Orthopedics;  Laterality: Right;  . LAPAROSCOPIC CHOLECYSTECTOMY    . STUMP REVISION Right  05/23/2016   Procedure: Revision Right Transmetatarsal Amputation, Right Gastrocnemius Recession;  Surgeon: Nadara Mustard, MD;  Location: [facility] OR;  Service: Orthopedics;  Laterality: Right;  . TOE AMPUTATION  2012   left foot; great toe and second toe   Social History   Occupational History  . Not on file.   Social History Main Topics  . Smoking status: Current Some Day Smoker    Packs/day: 0.10    Years: 4.00    Types: Cigarettes  . Smokeless tobacco: Never Used  . Alcohol use No  . Drug use: Yes    Frequency: 10.0 times per week    Types: Marijuana  . Sexual activity: Yes    Birth control/ protection: None

## 2016-08-22 ENCOUNTER — Encounter (HOSPITAL_COMMUNITY): Payer: Self-pay | Admitting: Emergency Medicine

## 2016-08-22 ENCOUNTER — Ambulatory Visit (HOSPITAL_COMMUNITY)
Admission: EM | Admit: 2016-08-22 | Discharge: 2016-08-22 | Disposition: A | Discharge status: Home / Self Care | Payer: PPO | Attending: Internal Medicine | Admitting: Internal Medicine

## 2016-08-22 DIAGNOSIS — E119 Type 2 diabetes mellitus without complications: Secondary | ICD-10-CM | POA: Diagnosis not present

## 2016-08-22 DIAGNOSIS — Z760 Encounter for issue of repeat prescription: Secondary | ICD-10-CM | POA: Diagnosis not present

## 2016-08-22 DIAGNOSIS — I10 Essential (primary) hypertension: Secondary | ICD-10-CM

## 2016-08-22 LAB — POCT URINALYSIS DIP (DEVICE)
Bilirubin Urine: NEGATIVE
Glucose, UA: NEGATIVE mg/dL
Ketones, ur: NEGATIVE mg/dL
Leukocytes, UA: NEGATIVE
Nitrite: NEGATIVE
Protein, ur: >=300 mg/dL — AB
Specific Gravity, Urine: >=1.03 (ref 1.005–1.030)
Urobilinogen, UA: 1 mg/dL (ref 0.0–1.0)
pH: 6 (ref 5.0–8.0)

## 2016-08-22 LAB — POCT I-STAT, CHEM 8
BUN: 27 mg/dL — ABNORMAL HIGH (ref 6–20)
Calcium, Ion: 1.22 mmol/L (ref 1.15–1.40)
Chloride: 99 mmol/L — ABNORMAL LOW (ref 101–111)
Creatinine, Ser: 1.7 mg/dL — ABNORMAL HIGH (ref 0.61–1.24)
Glucose, Bld: 181 mg/dL — ABNORMAL HIGH (ref 65–99)
HCT: 40 % (ref 39.0–52.0)
Hemoglobin: 13.6 g/dL (ref 13.0–17.0)
Potassium: 4.1 mmol/L (ref 3.5–5.1)
Sodium: 138 mmol/L (ref 135–145)
TCO2: 31 mmol/L (ref 0–100)

## 2016-08-22 MED ORDER — INSULIN GLARGINE 100 UNIT/ML ~~LOC~~ SOLN: 15.0000 [IU] | Freq: Every day | SUBCUTANEOUS | 1 refills | Status: DC

## 2016-08-22 MED ORDER — GABAPENTIN 100 MG PO CAPS: 200.0000 mg | ORAL_CAPSULE | Freq: Three times a day (TID) | ORAL | 1 refills | Status: DC

## 2016-08-22 MED ORDER — INSULIN ASPART 100 UNIT/ML ~~LOC~~ SOLN: 3.0000 [IU] | Freq: Every day | SUBCUTANEOUS | 1 refills | Status: DC

## 2016-08-22 MED ORDER — LISINOPRIL 10 MG PO TABS: 10.0000 mg | ORAL_TABLET | ORAL | 1 refills | Status: DC

## 2016-08-22 NOTE — ED Triage Notes (Signed)
Here for medication refill for DM and BP  Last had them 4 months ago  Reports blood glucose was 600 "something"  sts he is asymptomatic.... A&O x4... NAD

## 2016-08-22 NOTE — ED Provider Notes (Signed)
CSN: [id number]     Arrival date & time 08/22/16  1913 History   None    Chief Complaint  Patient presents with  . Medication Refill   (Consider location/radiation/quality/duration/timing/severity/associated sxs/prior Treatment) Patient is here to get refills on medications.  He is diabetic and has diabetic neuropathy   The history is provided by the patient.  Medication Refill  Reason for request:  Clinic/provider not available Medications taken before: yes - see home medications   Patient has complete original prescription information: yes     Past Medical History:  Diagnosis Date  . Acute osteomyelitis, ankle and foot 07/30/2010   Qualifier: Diagnosis of  By: Daiva EvesVan Dam MD, Remi Haggardornelius    . Anemia   . Chronic kidney disease (CKD), stage III (moderate)   . Collagen vascular disease (HCC)   . DDD (degenerative disc disease), lumbar   . Dehiscence of amputation stump (HCC)     dehiscence right transmetetarsal amputation achilles contracture  . Gastroparesis   . GERD (gastroesophageal reflux disease)   . Headache   . Hiatal hernia   . Hyperlipidemia   . Hypertension   . Pancreatitis   . Peripheral vascular disease (HCC)   . Polysubstance abuse 03/08/2016  . Renal insufficiency   . Type II diabetes mellitus (HCC) dx'd ~ 1996  . Vascular disease    poor circulation to left foot   Past Surgical History:  Procedure Laterality Date  . AMPUTATION  04/25/2011   Procedure: AMPUTATION DIGIT;  Surgeon: Nadara MustardMarcus V Duda, MD;  [facility];  Service: Orthopedics;  Laterality: Left;  Left foot 3rd toe amputation MTP joint, Gastroc Recession  Achilles Lengthening   . AMPUTATION Bilateral 03/25/2013   Procedure: AMPUTATION RAY;  Surgeon: Nadara MustardMarcus V Duda, MD;  Location: [facility] OR;  Service: Orthopedics;  Laterality: Bilateral;  Left Great Toe Amputation at  MTP Joint, Right 1st and 2nd Ray Amputation   . AMPUTATION Right 10/03/2014   Procedure: AMPUTATION MIDFOOT;  Surgeon: Nadara MustardMarcus Duda V, MD;   [facility];  Service: Orthopedics;  Laterality: Right;  . LAPAROSCOPIC CHOLECYSTECTOMY    . STUMP REVISION Right 05/23/2016   Procedure: Revision Right Transmetatarsal Amputation, Right Gastrocnemius Recession;  Surgeon: Nadara MustardMarcus V Duda, MD;  Location: [facility] OR;  Service: Orthopedics;  Laterality: Right;  . TOE AMPUTATION  2012   left foot; great toe and second toe   Family History  Problem Relation Age of Onset  . Heart attack Father 5952  . Hypertension Sister    Social History  Substance Use Topics  . Smoking status: Current Some Day Smoker    Packs/day: 0.10    Years: 4.00    Types: Cigarettes  . Smokeless tobacco: Never Used  . Alcohol use No    Review of Systems  Constitutional: Negative.   HENT: Negative.   Eyes: Negative.   Respiratory: Negative.   Cardiovascular: Negative.   Endocrine: Negative.   Genitourinary: Negative.   Allergic/Immunologic: Negative.   Neurological: Negative.     Allergies  No known allergies  Home Medications   Prior to Admission medications   Medication Sig Start Date End Date Taking? Authorizing Provider  cephALEXin (KEFLEX) 500 MG capsule Take 1 capsule (500 mg total) by mouth 4 (four) times daily. Patient not taking: Reported on 08/07/2016 07/11/16   Dione Boozeavid Glick, MD  gabapentin (NEURONTIN) 100 MG capsule Take 2 capsules (200 mg total) by mouth 3 (three) times daily. 08/22/16   Deatra CanterWilliam J Jamion Carter, FNP  insulin aspart (NOVOLOG)  100 UNIT/ML injection Inject 3 Units into the skin daily. 08/22/16   Deatra Canter, FNP  insulin glargine (LANTUS) 100 UNIT/ML injection Inject 0.15 mLs (15 Units total) into the skin at bedtime. 08/22/16   Deatra Canter, FNP  lisinopril (PRINIVIL,ZESTRIL) 10 MG tablet Take 1 tablet (10 mg total) by mouth every morning. 08/22/16   Deatra Canter, FNP  oxyCODONE-acetaminophen (PERCOCET/ROXICET) 5-325 MG tablet Take 1 tablet by mouth every 6 (six) hours as needed for severe pain. 07/10/16   Nadara Mustard, MD  silver  sulfADIAZINE (SILVADENE) 1 % cream Apply 1 application topically daily. Patient not taking: Reported on 08/07/2016 06/24/16   Adonis Huguenin, NP  sulfamethoxazole-trimethoprim (BACTRIM DS,SEPTRA DS) 800-160 MG tablet Take 1 tablet by mouth 2 (two) times daily. Patient not taking: Reported on 08/07/2016 07/11/16   Dione Booze, MD   Meds Ordered and Administered this Visit  Medications - No data to display  BP (!) 160/96 (BP Location: Left Arm)   Pulse 79   Temp 97.7 F (36.5 C) (Oral)   Resp 20   SpO2 100%  No data found.   Physical Exam  Constitutional: He appears well-developed and well-nourished.  HENT:  Head: Normocephalic and atraumatic.  Eyes: EOM are normal. Pupils are equal, round, and reactive to light.  Neck: Normal range of motion. Neck supple.  Cardiovascular: Normal rate, regular rhythm and normal heart sounds.   Pulmonary/Chest: Effort normal and breath sounds normal.  Abdominal: Soft. Bowel sounds are normal.  Nursing note and vitals reviewed.   Urgent Care Course     Procedures (including critical care time)  Labs Review Labs Reviewed  POCT I-STAT, CHEM 8 - Abnormal; Notable for the following:       Result Value   Chloride 99 (*)    BUN 27 (*)    Creatinine, Ser 1.70 (*)    Glucose, Bld 181 (*)    All other components within normal limits  POCT URINALYSIS DIP (DEVICE) - Abnormal; Notable for the following:    Hgb urine dipstick TRACE (*)    Protein, ur >=300 (*)    All other components within normal limits    Imaging Review No results found.   Visual Acuity Review  Right Eye Distance:   Left Eye Distance:   Bilateral Distance:    Right Eye Near:   Left Eye Near:    Bilateral Near:         MDM   1. Medication refill    Refilled Novolog and lantus insulin Refilled lisinopril 10mg  po qd Refilled Gabapentin    Deatra Canter, FNP 08/22/16 2021

## 2016-09-04 ENCOUNTER — Ambulatory Visit (INDEPENDENT_AMBULATORY_CARE_PROVIDER_SITE_OTHER): Payer: PPO | Admitting: Orthopedic Surgery

## 2016-09-04 DIAGNOSIS — M869 Osteomyelitis, unspecified: Secondary | ICD-10-CM

## 2016-09-04 DIAGNOSIS — L97512 Non-pressure chronic ulcer of other part of right foot with fat layer exposed: Secondary | ICD-10-CM

## 2016-09-04 DIAGNOSIS — L97509 Non-pressure chronic ulcer of other part of unspecified foot with unspecified severity: Secondary | ICD-10-CM

## 2016-09-04 DIAGNOSIS — B351 Tinea unguium: Secondary | ICD-10-CM

## 2016-09-04 DIAGNOSIS — E11621 Type 2 diabetes mellitus with foot ulcer: Secondary | ICD-10-CM

## 2016-09-04 DIAGNOSIS — E1169 Type 2 diabetes mellitus with other specified complication: Secondary | ICD-10-CM

## 2016-09-04 MED ORDER — MUPIROCIN 2 % EX OINT: 1.0000 "application " | TOPICAL_OINTMENT | Freq: Two times a day (BID) | CUTANEOUS | 6 refills | Status: DC

## 2016-09-04 NOTE — Progress Notes (Signed)
Office Visit Note   Patient: [name]           Date of Birth: 17-Apr-1976           MRN: [id number] Visit Date: 09/04/2016              Requested by: Fleet Contras, MD [address] [place], [place] [address] PCP: No PCP Per Patient  No chief complaint on file.     HPI: The patient is a 41 year old gentleman today for evaluation of right foot ulceration to his transmetatarsal amputation. Has been doing mupirocin dressing changes daily. Ambulating in a Darco shoe. Does request nail trim on the left. No concerns voiced today.  Assessment & Plan: Visit Diagnoses:  1. Diabetic foot ulcer with osteomyelitis (HCC)   2. Onychomycosis   3. Non-pressure chronic ulcer of other part of right foot with fat layer exposed (HCC)     Plan: Tinea with daily wound cleansing and antibacterial ointment dressing changes. Does request a refill on his ointment. Protective shoe wear Darco on the right  Follow-Up Instructions: Return in about 4 weeks (around 10/02/2016).   Ortho Exam Physical Exam  Constitutional: Appears well-developed.  Head: Normocephalic.  Eyes: EOM are normal.  Neck: Normal range of motion.  Cardiovascular: Normal rate.   Pulmonary/Chest: Effort normal.  Neurological: Is alert.  Skin: Skin is warm.  Psychiatric: Has a normal mood and affect. Right foot: This is 4 cm x 1 cm in 8 mm deep. There is flat pink tissue in wound bed scant exudative tissue. Was debrided of nonviable tissue. There is no drainage. Does have some foul odor there is a little maceration. Left foot has thickened and discolored onychomycotic nails 2 status post toe amputations 3 these are well healed. Safely trim his own toenails were trimmed for him today.  Imaging: No results found.  Labs: Lab Results  Component Value Date   HGBA1C 6.4 (H) 05/28/2015   HGBA1C 13.6 (H) 09/28/2014   HGBA1C 6.9 (H) 05/30/2014   ESRSEDRATE 53 (H) 07/04/2016   ESRSEDRATE 34 (H) 02/27/2016   ESRSEDRATE >140  (H) 10/01/2014   CRP 0.9 07/04/2016   CRP 1.8 (H) 02/27/2016   CRP 11.7 (H) 10/01/2014   REPTSTATUS 03/03/2016 FINAL 02/27/2016   GRAMSTAIN  07/22/2010    FEW WBC PRESENT,BOTH PMN AND MONONUCLEAR RARE SQUAMOUS EPITHELIAL CELLS PRESENT RARE GRAM POSITIVE COCCI IN PAIRS   GRAMSTAIN  07/22/2010    FEW WBC PRESENT,BOTH PMN AND MONONUCLEAR RARE SQUAMOUS EPITHELIAL CELLS PRESENT RARE GRAM POSITIVE COCCI IN PAIRS   CULT  02/27/2016    NO GROWTH 5 DAYS Performed at [facility]    [facility] STAPHYLOCOCCUS AUREUS 07/22/2010    Orders:  No orders of the defined types were placed in this encounter.  No orders of the defined types were placed in this encounter.    Procedures: No procedures performed  Clinical Data: No additional findings.  ROS:  All other systems negative, except as noted in the HPI. Review of Systems  Constitutional: Negative for chills and fever.  Skin: Negative for color change.    Objective: Vital Signs: There were no vitals taken for this visit.  Specialty Comments:  No specialty comments available.  PMFS History: Patient Active Problem List   Diagnosis Date Noted  . Non-pressure chronic ulcer of other part of right foot with fat layer exposed (HCC) 08/07/2016  . Diabetic polyneuropathy associated with type 2 diabetes mellitus (HCC) 07/10/2016  . Status post transmetatarsal amputation of  foot, right (HCC) 07/10/2016  . Acquired contracture of Achilles tendon, right   . Acquired absence of right foot (HCC) 05/14/2016  . Polysubstance abuse 03/08/2016  . Tobacco abuse 02/27/2016  . Gastroparesis 05/28/2015  . GERD (gastroesophageal reflux disease) 10/01/2014  . Right foot infection 10/01/2014  . Osteomyelitis of foot, right, acute (HCC) 10/01/2014  . Esophageal reflux   . Fever 09/27/2014  . Abdominal pain, lower 09/27/2014  . Abnormal ECG 05/30/2014  . Chest pain 05/30/2014  . Ankle pain 05/30/2014  . Pain in the chest   . Nausea  08/30/2013  . Epigastric abdominal pain 08/30/2013  . Low grade fever 08/30/2013  . Diabetic foot ulcer with osteomyelitis (HCC) 05/12/2013  . Osteomyelitis (HCC) 05/12/2013  . Diabetic osteomyelitis b/l toes 03/23/2013  . DM (diabetes mellitus) type II uncontrolled, periph vascular disorder (HCC) 03/23/2013  . CKD (chronic kidney disease), stage III 03/23/2013  . Anemia 03/23/2013  . Benign essential HTN 03/23/2013  . Right foot ulcer, limited to breakdown of skin (HCC) 03/22/2013  . AKI (acute kidney injury) (HCC) 03/22/2013  . Viral gastroenteritis 09/17/2012  . Nausea & vomiting 09/16/2012  . Acute pancreatitis 09/16/2012  . Diabetes mellitus (HCC) 09/20/2010  . Onychomycosis 09/20/2010  . METHICILLIN SUSCEPTIBLE STAPH AUREUS SEPTICEMIA 07/30/2010  . ACUTE OSTEOMYELITIS, ANKLE AND FOOT 07/30/2010   Past Medical History:  Diagnosis Date  . Acute osteomyelitis, ankle and foot 07/30/2010   Qualifier: Diagnosis of  By: Daiva EvesVan Dam MD, Remi Haggardornelius    . Anemia   . Chronic kidney disease (CKD), stage III (moderate)   . Collagen vascular disease (HCC)   . DDD (degenerative disc disease), lumbar   . Dehiscence of amputation stump (HCC)     dehiscence right transmetetarsal amputation achilles contracture  . Gastroparesis   . GERD (gastroesophageal reflux disease)   . Headache   . Hiatal hernia   . Hyperlipidemia   . Hypertension   . Pancreatitis   . Peripheral vascular disease (HCC)   . Polysubstance abuse 03/08/2016  . Renal insufficiency   . Type II diabetes mellitus (HCC) dx'd ~ 1996  . Vascular disease    poor circulation to left foot    Family History  Problem Relation Age of Onset  . Heart attack Father 7952  . Hypertension Sister     Past Surgical History:  Procedure Laterality Date  . AMPUTATION  04/25/2011   Procedure: AMPUTATION DIGIT;  Surgeon: Nadara MustardMarcus V Duda, MD;  [facility];  Service: Orthopedics;  Laterality: Left;  Left foot 3rd toe amputation MTP joint, Gastroc  Recession  Achilles Lengthening   . AMPUTATION Bilateral 03/25/2013   Procedure: AMPUTATION RAY;  Surgeon: Nadara MustardMarcus V Duda, MD;  Location: [facility] OR;  Service: Orthopedics;  Laterality: Bilateral;  Left Great Toe Amputation at  MTP Joint, Right 1st and 2nd Ray Amputation   . AMPUTATION Right 10/03/2014   Procedure: AMPUTATION MIDFOOT;  Surgeon: Nadara MustardMarcus Duda V, MD;  [facility];  Service: Orthopedics;  Laterality: Right;  . LAPAROSCOPIC CHOLECYSTECTOMY    . STUMP REVISION Right 05/23/2016   Procedure: Revision Right Transmetatarsal Amputation, Right Gastrocnemius Recession;  Surgeon: Nadara MustardMarcus V Duda, MD;  Location: [facility] OR;  Service: Orthopedics;  Laterality: Right;  . TOE AMPUTATION  2012   left foot; great toe and second toe   Social History   Occupational History  . Not on file.   Social History Main Topics  . Smoking status: Current Some Day Smoker    Packs/day: 0.10  Years: 4.00    Types: Cigarettes  . Smokeless tobacco: Never Used  . Alcohol use No  . Drug use: Yes    Frequency: 10.0 times per week    Types: Marijuana  . Sexual activity: Yes    Birth control/ protection: None

## 2016-09-04 NOTE — Addendum Note (Signed)
Addended by: Barnie DelZAMORA, Destini Cambre R on: 09/04/2016 11:02 AM   Modules accepted: Orders

## 2016-09-29 ENCOUNTER — Ambulatory Visit (INDEPENDENT_AMBULATORY_CARE_PROVIDER_SITE_OTHER): Payer: Medicaid Other | Admitting: Orthopedic Surgery

## 2016-09-29 DIAGNOSIS — Z89431 Acquired absence of right foot: Secondary | ICD-10-CM | POA: Diagnosis not present

## 2016-09-29 NOTE — Progress Notes (Signed)
Office Visit Note   Patient: [name]           Date of Birth: [birth year]           MRN: [id number] Visit Date: 09/29/2016              Requested by: No referring provider defined for this encounter. PCP: No PCP Per Patient  No chief complaint on file.     HPI: The patient is a 41 year old gentleman today for evaluation of right foot ulceration to his transmetatarsal amputation. Has been doing mupirocin dressing changes daily. Ambulating in a Darco shoe. No concerns voiced today.  Assessment & Plan: Visit Diagnoses:  1. Status post transmetatarsal amputation of foot, right (HCC)     Plan: Continue with daily wound cleansing and antibacterial ointment dressing changes. Darko has worn out. Is given a new postop shoe.   Follow-Up Instructions: Return in about 4 weeks (around 10/27/2016).   Ortho Exam Physical Exam  Constitutional: Appears well-developed.  Head: Normocephalic.  Eyes: EOM are normal.  Neck: Normal range of motion.  Cardiovascular: Normal rate.   Pulmonary/Chest: Effort normal.  Neurological: Is alert.  Skin: Skin is warm.  Psychiatric: Has a normal mood and affect. Right foot: This is 32 mm x 6 mm and 3 mm deep. There is flat pink tissue in wound bed scant exudative tissue. Was debrided of nonviable tissue. There is no drainage. No foul odor. No erythema or cellulitis. Imaging: No results found.  Labs: Lab Results  Component Value Date   HGBA1C 6.4 (H) 05/28/2015   HGBA1C 13.6 (H) 09/28/2014   HGBA1C 6.9 (H) 05/30/2014   ESRSEDRATE 53 (H) 07/04/2016   ESRSEDRATE 34 (H) 02/27/2016   ESRSEDRATE >140 (H) 10/01/2014   CRP 0.9 07/04/2016   CRP 1.8 (H) 02/27/2016   CRP 11.7 (H) 10/01/2014   REPTSTATUS 03/03/2016 FINAL 02/27/2016   GRAMSTAIN  07/22/2010    FEW WBC PRESENT,BOTH PMN AND MONONUCLEAR RARE SQUAMOUS EPITHELIAL CELLS PRESENT RARE GRAM POSITIVE COCCI IN PAIRS   GRAMSTAIN  07/22/2010    FEW WBC PRESENT,BOTH PMN AND MONONUCLEAR RARE  SQUAMOUS EPITHELIAL CELLS PRESENT RARE GRAM POSITIVE COCCI IN PAIRS   CULT  02/27/2016    NO GROWTH 5 DAYS Performed at [facility]    [facility] STAPHYLOCOCCUS AUREUS 07/22/2010    Orders:  No orders of the defined types were placed in this encounter.  No orders of the defined types were placed in this encounter.    Procedures: No procedures performed  Clinical Data: No additional findings.  ROS:  All other systems negative, except as noted in the HPI. Review of Systems  Constitutional: Negative for chills and fever.  Skin: Positive for wound. Negative for color change.    Objective: Vital Signs: There were no vitals taken for this visit.  Specialty Comments:  No specialty comments available.  PMFS History: Patient Active Problem List   Diagnosis Date Noted  . Diabetic polyneuropathy associated with type 2 diabetes mellitus (HCC) 07/10/2016  . Status post transmetatarsal amputation of foot, right (HCC) 07/10/2016  . Acquired contracture of Achilles tendon, right   . Acquired absence of right foot (HCC) 05/14/2016  . Polysubstance abuse 03/08/2016  . Tobacco abuse 02/27/2016  . Gastroparesis 05/28/2015  . GERD (gastroesophageal reflux disease) 10/01/2014  . Right foot infection 10/01/2014  . Esophageal reflux   . Fever 09/27/2014  . Abdominal pain, lower 09/27/2014  . Abnormal ECG 05/30/2014  . Chest pain 05/30/2014  . Ankle  pain 05/30/2014  . Pain in the chest   . Nausea 08/30/2013  . Epigastric abdominal pain 08/30/2013  . Low grade fever 08/30/2013  . Diabetic foot ulcer with osteomyelitis (HCC) 05/12/2013  . Diabetic osteomyelitis b/l toes 03/23/2013  . DM (diabetes mellitus) type II uncontrolled, periph vascular disorder (HCC) 03/23/2013  . CKD (chronic kidney disease), stage III 03/23/2013  . Anemia 03/23/2013  . Benign essential HTN 03/23/2013  . AKI (acute kidney injury) (HCC) 03/22/2013  . Viral gastroenteritis 09/17/2012  . Nausea &  vomiting 09/16/2012  . Acute pancreatitis 09/16/2012  . Diabetes mellitus (HCC) 09/20/2010  . Onychomycosis 09/20/2010  . METHICILLIN SUSCEPTIBLE STAPH AUREUS SEPTICEMIA 07/30/2010   Past Medical History:  Diagnosis Date  . Acute osteomyelitis, ankle and foot 07/30/2010   Qualifier: Diagnosis of  By: Daiva Eves MD, Remi Haggard    . Anemia   . Chronic kidney disease (CKD), stage III (moderate)   . Collagen vascular disease (HCC)   . DDD (degenerative disc disease), lumbar   . Dehiscence of amputation stump (HCC)     dehiscence right transmetetarsal amputation achilles contracture  . Gastroparesis   . GERD (gastroesophageal reflux disease)   . Headache   . Hiatal hernia   . Hyperlipidemia   . Hypertension   . Pancreatitis   . Peripheral vascular disease (HCC)   . Polysubstance abuse 03/08/2016  . Renal insufficiency   . Type II diabetes mellitus (HCC) dx'd ~ 1996  . Vascular disease    poor circulation to left foot    Family History  Problem Relation Age of Onset  . Heart attack Father 69  . Hypertension Sister     Past Surgical History:  Procedure Laterality Date  . AMPUTATION  04/25/2011   Procedure: AMPUTATION DIGIT;  Surgeon: Nadara Mustard, MD;  [facility];  Service: Orthopedics;  Laterality: Left;  Left foot 3rd toe amputation MTP joint, Gastroc Recession  Achilles Lengthening   . AMPUTATION Bilateral 03/25/2013   Procedure: AMPUTATION RAY;  Surgeon: Nadara Mustard, MD;  Location: [facility] OR;  Service: Orthopedics;  Laterality: Bilateral;  Left Great Toe Amputation at  MTP Joint, Right 1st and 2nd Ray Amputation   . AMPUTATION Right 10/03/2014   Procedure: AMPUTATION MIDFOOT;  Surgeon: Nadara Mustard, MD;  Location: [facility];  Service: Orthopedics;  Laterality: Right;  . LAPAROSCOPIC CHOLECYSTECTOMY    . STUMP REVISION Right 05/23/2016   Procedure: Revision Right Transmetatarsal Amputation, Right Gastrocnemius Recession;  Surgeon: Nadara Mustard, MD;  Location: [facility] OR;  Service:  Orthopedics;  Laterality: Right;  . TOE AMPUTATION  2012   left foot; great toe and second toe   Social History   Occupational History  . Not on file.   Social History Main Topics  . Smoking status: Current Some Day Smoker    Packs/day: 0.10    Years: 4.00    Types: Cigarettes  . Smokeless tobacco: Never Used  . Alcohol use No  . Drug use: Yes    Frequency: 10.0 times per week    Types: Marijuana  . Sexual activity: Yes    Birth control/ protection: None

## 2016-10-01 ENCOUNTER — Encounter (HOSPITAL_COMMUNITY): Payer: Self-pay | Admitting: Emergency Medicine

## 2016-10-01 ENCOUNTER — Ambulatory Visit (HOSPITAL_COMMUNITY)
Admission: EM | Admit: 2016-10-01 | Discharge: 2016-10-01 | Disposition: A | Discharge status: Home / Self Care | Payer: PPO | Attending: Internal Medicine | Admitting: Internal Medicine

## 2016-10-01 DIAGNOSIS — S39012A Strain of muscle, fascia and tendon of lower back, initial encounter: Secondary | ICD-10-CM

## 2016-10-01 DIAGNOSIS — M545 Low back pain, unspecified: Secondary | ICD-10-CM

## 2016-10-01 MED ORDER — MELOXICAM 7.5 MG PO TABS: 7.5000 mg | ORAL_TABLET | Freq: Every day | ORAL | 0 refills | Status: DC

## 2016-10-01 MED ORDER — TRAMADOL HCL 50 MG PO TABS: 50.0000 mg | ORAL_TABLET | Freq: Four times a day (QID) | ORAL | 0 refills | Status: DC | PRN
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
## 2016-10-01 NOTE — Discharge Instructions (Signed)
For the first day or 2 use cold compresses. Then change to heat. Perform gentle stretches by leaning forward and stretching the low back muscles as demonstrated. No heavy lifting, bending or pulling for at least 1 week. Heat and stretches all the mainstay of treatment as well as limiting further workload on the back. Take medications as directed and use sparingly. He may have pain for another few days. Follow-up with your primary care doctor as needed.
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
## 2016-10-21 ENCOUNTER — Encounter (HOSPITAL_BASED_OUTPATIENT_CLINIC_OR_DEPARTMENT_OTHER): Payer: PPO | Attending: Surgery

## 2016-10-21 DIAGNOSIS — E11621 Type 2 diabetes mellitus with foot ulcer: Secondary | ICD-10-CM | POA: Diagnosis not present

## 2016-10-21 DIAGNOSIS — I10 Essential (primary) hypertension: Secondary | ICD-10-CM | POA: Insufficient documentation

## 2016-10-21 DIAGNOSIS — L97512 Non-pressure chronic ulcer of other part of right foot with fat layer exposed: Secondary | ICD-10-CM | POA: Diagnosis not present

## 2016-10-21 DIAGNOSIS — I739 Peripheral vascular disease, unspecified: Secondary | ICD-10-CM | POA: Diagnosis not present

## 2016-10-21 DIAGNOSIS — Z794 Long term (current) use of insulin: Secondary | ICD-10-CM | POA: Diagnosis not present

## 2016-10-21 DIAGNOSIS — E1140 Type 2 diabetes mellitus with diabetic neuropathy, unspecified: Secondary | ICD-10-CM | POA: Diagnosis not present

## 2016-10-21 DIAGNOSIS — M869 Osteomyelitis, unspecified: Secondary | ICD-10-CM | POA: Diagnosis not present

## 2016-10-21 DIAGNOSIS — D649 Anemia, unspecified: Secondary | ICD-10-CM | POA: Diagnosis not present

## 2016-10-21 DIAGNOSIS — Z87891 Personal history of nicotine dependence: Secondary | ICD-10-CM | POA: Insufficient documentation

## 2016-10-22 DIAGNOSIS — L97919 Non-pressure chronic ulcer of unspecified part of right lower leg with unspecified severity: Secondary | ICD-10-CM | POA: Diagnosis not present

## 2016-10-27 ENCOUNTER — Ambulatory Visit (HOSPITAL_COMMUNITY)
Admission: RE | Admit: 2016-10-27 | Discharge: 2016-10-27 | Disposition: A | Discharge status: Home / Self Care | Payer: PPO | Source: Ambulatory Visit | Attending: Surgery | Admitting: Surgery

## 2016-10-27 ENCOUNTER — Other Ambulatory Visit: Payer: Self-pay | Admitting: Surgery

## 2016-10-27 ENCOUNTER — Ambulatory Visit (INDEPENDENT_AMBULATORY_CARE_PROVIDER_SITE_OTHER): Payer: PPO | Admitting: Family

## 2016-10-27 ENCOUNTER — Ambulatory Visit (INDEPENDENT_AMBULATORY_CARE_PROVIDER_SITE_OTHER): Payer: PPO | Admitting: Orthopedic Surgery

## 2016-10-27 DIAGNOSIS — L97512 Non-pressure chronic ulcer of other part of right foot with fat layer exposed: Secondary | ICD-10-CM | POA: Diagnosis not present

## 2016-10-27 DIAGNOSIS — L97519 Non-pressure chronic ulcer of other part of right foot with unspecified severity: Secondary | ICD-10-CM | POA: Diagnosis not present

## 2016-10-27 DIAGNOSIS — Z89421 Acquired absence of other right toe(s): Secondary | ICD-10-CM | POA: Insufficient documentation

## 2016-10-29 DIAGNOSIS — L97512 Non-pressure chronic ulcer of other part of right foot with fat layer exposed: Secondary | ICD-10-CM | POA: Diagnosis not present

## 2016-10-29 DIAGNOSIS — E11621 Type 2 diabetes mellitus with foot ulcer: Secondary | ICD-10-CM | POA: Diagnosis not present

## 2016-10-30 ENCOUNTER — Ambulatory Visit (INDEPENDENT_AMBULATORY_CARE_PROVIDER_SITE_OTHER): Payer: Medicaid Other | Admitting: Family

## 2016-11-04 DIAGNOSIS — E11621 Type 2 diabetes mellitus with foot ulcer: Secondary | ICD-10-CM | POA: Diagnosis not present

## 2016-11-04 DIAGNOSIS — L97512 Non-pressure chronic ulcer of other part of right foot with fat layer exposed: Secondary | ICD-10-CM | POA: Diagnosis not present

## 2016-11-11 ENCOUNTER — Encounter (HOSPITAL_BASED_OUTPATIENT_CLINIC_OR_DEPARTMENT_OTHER): Payer: PPO | Attending: Surgery

## 2016-11-11 DIAGNOSIS — Z89431 Acquired absence of right foot: Secondary | ICD-10-CM | POA: Diagnosis not present

## 2016-11-11 DIAGNOSIS — L97512 Non-pressure chronic ulcer of other part of right foot with fat layer exposed: Secondary | ICD-10-CM | POA: Diagnosis not present

## 2016-11-11 DIAGNOSIS — E1151 Type 2 diabetes mellitus with diabetic peripheral angiopathy without gangrene: Secondary | ICD-10-CM | POA: Insufficient documentation

## 2016-11-11 DIAGNOSIS — E1140 Type 2 diabetes mellitus with diabetic neuropathy, unspecified: Secondary | ICD-10-CM | POA: Insufficient documentation

## 2016-11-11 DIAGNOSIS — E11621 Type 2 diabetes mellitus with foot ulcer: Secondary | ICD-10-CM | POA: Diagnosis not present

## 2016-11-11 DIAGNOSIS — I10 Essential (primary) hypertension: Secondary | ICD-10-CM | POA: Insufficient documentation

## 2016-11-11 DIAGNOSIS — Z87891 Personal history of nicotine dependence: Secondary | ICD-10-CM | POA: Diagnosis not present

## 2016-11-12 DIAGNOSIS — Z794 Long term (current) use of insulin: Secondary | ICD-10-CM | POA: Diagnosis not present

## 2016-11-12 DIAGNOSIS — Z899 Acquired absence of limb, unspecified: Secondary | ICD-10-CM | POA: Diagnosis not present

## 2016-11-12 DIAGNOSIS — N183 Chronic kidney disease, stage 3 (moderate): Secondary | ICD-10-CM | POA: Diagnosis not present

## 2016-11-12 DIAGNOSIS — E0822 Diabetes mellitus due to underlying condition with diabetic chronic kidney disease: Secondary | ICD-10-CM | POA: Diagnosis not present

## 2016-11-25 DIAGNOSIS — E11621 Type 2 diabetes mellitus with foot ulcer: Secondary | ICD-10-CM | POA: Diagnosis not present

## 2016-11-25 DIAGNOSIS — L97512 Non-pressure chronic ulcer of other part of right foot with fat layer exposed: Secondary | ICD-10-CM | POA: Diagnosis not present

## 2016-12-02 DIAGNOSIS — E11621 Type 2 diabetes mellitus with foot ulcer: Secondary | ICD-10-CM | POA: Diagnosis not present

## 2016-12-02 DIAGNOSIS — L97512 Non-pressure chronic ulcer of other part of right foot with fat layer exposed: Secondary | ICD-10-CM | POA: Diagnosis not present

## 2016-12-09 ENCOUNTER — Encounter (HOSPITAL_BASED_OUTPATIENT_CLINIC_OR_DEPARTMENT_OTHER): Payer: PPO | Attending: Surgery

## 2016-12-09 DIAGNOSIS — L97512 Non-pressure chronic ulcer of other part of right foot with fat layer exposed: Secondary | ICD-10-CM | POA: Insufficient documentation

## 2016-12-09 DIAGNOSIS — E11621 Type 2 diabetes mellitus with foot ulcer: Secondary | ICD-10-CM | POA: Insufficient documentation

## 2016-12-09 DIAGNOSIS — Z89431 Acquired absence of right foot: Secondary | ICD-10-CM | POA: Insufficient documentation

## 2016-12-17 DIAGNOSIS — L97512 Non-pressure chronic ulcer of other part of right foot with fat layer exposed: Secondary | ICD-10-CM | POA: Diagnosis not present

## 2016-12-17 DIAGNOSIS — E11621 Type 2 diabetes mellitus with foot ulcer: Secondary | ICD-10-CM | POA: Diagnosis not present

## 2016-12-17 DIAGNOSIS — L97511 Non-pressure chronic ulcer of other part of right foot limited to breakdown of skin: Secondary | ICD-10-CM | POA: Diagnosis not present

## 2016-12-17 DIAGNOSIS — Z89431 Acquired absence of right foot: Secondary | ICD-10-CM | POA: Diagnosis not present

## 2016-12-24 DIAGNOSIS — L97519 Non-pressure chronic ulcer of other part of right foot with unspecified severity: Secondary | ICD-10-CM | POA: Diagnosis not present

## 2016-12-24 DIAGNOSIS — E11621 Type 2 diabetes mellitus with foot ulcer: Secondary | ICD-10-CM | POA: Diagnosis not present

## 2017-01-08 ENCOUNTER — Ambulatory Visit (INDEPENDENT_AMBULATORY_CARE_PROVIDER_SITE_OTHER): Payer: PPO | Admitting: Podiatry

## 2017-01-08 ENCOUNTER — Other Ambulatory Visit: Payer: Self-pay

## 2017-01-08 ENCOUNTER — Encounter: Payer: Self-pay | Admitting: Podiatry

## 2017-01-08 VITALS — BP 142/91 | HR 86 | Resp 18

## 2017-01-08 DIAGNOSIS — M79676 Pain in unspecified toe(s): Secondary | ICD-10-CM

## 2017-01-08 DIAGNOSIS — E119 Type 2 diabetes mellitus without complications: Secondary | ICD-10-CM | POA: Diagnosis not present

## 2017-01-08 DIAGNOSIS — B351 Tinea unguium: Secondary | ICD-10-CM | POA: Diagnosis not present

## 2017-01-08 DIAGNOSIS — M2042 Other hammer toe(s) (acquired), left foot: Secondary | ICD-10-CM | POA: Diagnosis not present

## 2017-01-08 DIAGNOSIS — Z899 Acquired absence of limb, unspecified: Secondary | ICD-10-CM

## 2017-01-08 DIAGNOSIS — E1149 Type 2 diabetes mellitus with other diabetic neurological complication: Secondary | ICD-10-CM | POA: Diagnosis not present

## 2017-01-08 DIAGNOSIS — M79605 Pain in left leg: Secondary | ICD-10-CM

## 2017-01-08 DIAGNOSIS — M79604 Pain in right leg: Secondary | ICD-10-CM

## 2017-01-08 DIAGNOSIS — M2041 Other hammer toe(s) (acquired), right foot: Secondary | ICD-10-CM | POA: Diagnosis not present

## 2017-01-08 NOTE — Patient Instructions (Signed)

## 2017-01-08 NOTE — Progress Notes (Signed)
   Subjective:    Patient ID: [name], male    DOB: 09/29/1975, 41 y.o.   MRN: [id number]  HPI  41 year old male presents the office they for diabetic foot evaluation as well as requesting diabetic shoes. He does have a history of a transmetatarsal amputation on the right side as well as amputations of digits 1 through 3 on the left side. He just recently underwent a transmetatarsal amputation with Dr. Lajoyce Cornersuda on the right side which is released by Dr. Lajoyce Cornersuda. He states the nails on the left fourth and fifth toes are thick and causing irritation in shoes. He currently denies any open sores. He has calluses to both of his feet. He does have neuropathy and takes gabapentin as needed. Denies any claudication symptoms. He has no other concerns today.  He is unsure of his last A1c but his blood sugar she states from between 150 to 160.  Review of Systems  Constitutional: Positive for activity change and fatigue.  Eyes: Positive for redness and itching.  Cardiovascular: Positive for leg swelling.  Gastrointestinal: Positive for abdominal pain.       Bloating   Endocrine:       Increase urination   Genitourinary: Positive for frequency.  Musculoskeletal: Positive for back pain.  Hematological:       Slow to heal  All other systems reviewed and are negative.      Objective:   Physical Exam General: AAO x3, NAD  Dermatological: Nails are hypertrophic, dystrophic, brittle, discolored, elongated 2. No surrounding redness or drainage. Tenderness to nails 4 and 5 on the left. Hyperkeratotic lesions bilateral submetatarsal 5. Upon debridement there is no underlying ulceration, drainage or any signs of infection.   Vascular: Dorsalis Pedis artery and Posterior Tibial artery pedal pulses are 2/4 bilateral with immedate capillary fill time.  There is no pain with calf compression, swelling, warmth, erythema.   Neruologic: Sensation decreased with Dorann OuSimms Weinstein monofilament  Musculoskeletal:  Transmetatarsal amputation on the right foot and digital indications of 1-3 on the left foot. Hammertoes on digits 4 and 5 on the left.   Assessment: Patient presents for diabetic foot evaluation, requesting diabetic shoes as well as symptomatic onychomycosis  Plan: -Treatment options discussed including all alternatives, risks, and complications -Nail sharply debrided 2 without complications or bleeding -Hyperkeratotic lesion sharply debrided 2 without competitions or bleeding.  -Given multiple limitations as well as digital deformity and pre-ulcerative calluses that do recommend diabetic shoes. Paperwork was completed today for certification as well as he was seen by Raiford Nobleick today for molding.  -Daily foot inspection -RTC 3 months for foot evaluation or sooner if needed. Will see him back with Raiford Nobleick prior for diabetic shoes/inserts.   Ovid CurdMatthew Vishnu Moeller, DPM

## 2017-01-29 ENCOUNTER — Other Ambulatory Visit: Payer: PPO | Admitting: Orthotics

## 2017-02-11 DIAGNOSIS — Z794 Long term (current) use of insulin: Secondary | ICD-10-CM | POA: Diagnosis not present

## 2017-02-11 DIAGNOSIS — E782 Mixed hyperlipidemia: Secondary | ICD-10-CM | POA: Diagnosis not present

## 2017-02-11 DIAGNOSIS — Z23 Encounter for immunization: Secondary | ICD-10-CM | POA: Diagnosis not present

## 2017-02-11 DIAGNOSIS — E7800 Pure hypercholesterolemia, unspecified: Secondary | ICD-10-CM | POA: Diagnosis not present

## 2017-02-11 DIAGNOSIS — Z899 Acquired absence of limb, unspecified: Secondary | ICD-10-CM | POA: Diagnosis not present

## 2017-02-11 DIAGNOSIS — E0822 Diabetes mellitus due to underlying condition with diabetic chronic kidney disease: Secondary | ICD-10-CM | POA: Diagnosis not present

## 2017-02-11 DIAGNOSIS — N183 Chronic kidney disease, stage 3 (moderate): Secondary | ICD-10-CM | POA: Diagnosis not present

## 2017-02-12 ENCOUNTER — Ambulatory Visit (INDEPENDENT_AMBULATORY_CARE_PROVIDER_SITE_OTHER): Payer: PPO | Admitting: Orthotics

## 2017-02-12 DIAGNOSIS — E119 Type 2 diabetes mellitus without complications: Secondary | ICD-10-CM

## 2017-02-12 DIAGNOSIS — M79604 Pain in right leg: Secondary | ICD-10-CM

## 2017-02-12 DIAGNOSIS — E1149 Type 2 diabetes mellitus with other diabetic neurological complication: Secondary | ICD-10-CM | POA: Diagnosis not present

## 2017-02-12 DIAGNOSIS — Z899 Acquired absence of limb, unspecified: Secondary | ICD-10-CM | POA: Diagnosis not present

## 2017-02-12 DIAGNOSIS — M79605 Pain in left leg: Secondary | ICD-10-CM

## 2017-02-12 NOTE — Progress Notes (Signed)
Patient came in today to pick up diabetic shoes and custom inserts.(L5000 bilateral)  Same was well pleased with fit and function.   The patient could ambulate without any discomfort; there were no signs of any quality issues. The foot ortheses offered full contact with plantar surface and contoured the arch well.   The shoes fit well with no heel slippage and areas of pressure concern.   Patient advised to contact us if any problems arise.  Patient also advised on how to report any issues.

## 2017-03-31 ENCOUNTER — Encounter (HOSPITAL_BASED_OUTPATIENT_CLINIC_OR_DEPARTMENT_OTHER): Payer: PPO | Attending: Surgery

## 2017-03-31 DIAGNOSIS — N183 Chronic kidney disease, stage 3 (moderate): Secondary | ICD-10-CM | POA: Insufficient documentation

## 2017-03-31 DIAGNOSIS — T8189XA Other complications of procedures, not elsewhere classified, initial encounter: Secondary | ICD-10-CM | POA: Diagnosis not present

## 2017-03-31 DIAGNOSIS — Z794 Long term (current) use of insulin: Secondary | ICD-10-CM | POA: Insufficient documentation

## 2017-03-31 DIAGNOSIS — Z87891 Personal history of nicotine dependence: Secondary | ICD-10-CM | POA: Insufficient documentation

## 2017-03-31 DIAGNOSIS — Z89422 Acquired absence of other left toe(s): Secondary | ICD-10-CM | POA: Diagnosis not present

## 2017-03-31 DIAGNOSIS — E1122 Type 2 diabetes mellitus with diabetic chronic kidney disease: Secondary | ICD-10-CM | POA: Diagnosis not present

## 2017-03-31 DIAGNOSIS — Z89431 Acquired absence of right foot: Secondary | ICD-10-CM | POA: Insufficient documentation

## 2017-03-31 DIAGNOSIS — L84 Corns and callosities: Secondary | ICD-10-CM | POA: Diagnosis not present

## 2017-04-10 ENCOUNTER — Ambulatory Visit: Payer: PPO | Admitting: Podiatry

## 2017-04-14 ENCOUNTER — Ambulatory Visit: Payer: PPO | Admitting: Podiatry

## 2017-04-27 ENCOUNTER — Ambulatory Visit: Payer: PPO | Admitting: Podiatry

## 2017-05-13 ENCOUNTER — Other Ambulatory Visit: Payer: Self-pay

## 2017-05-13 ENCOUNTER — Inpatient Hospital Stay (HOSPITAL_COMMUNITY)
Admission: EM | Admit: 2017-05-13 | Discharge: 2017-05-16 | DRG: 475 | Disposition: A | Discharge status: Home / Self Care | Payer: PPO | Attending: Internal Medicine | Admitting: Internal Medicine

## 2017-05-13 ENCOUNTER — Emergency Department (HOSPITAL_COMMUNITY): Payer: PPO

## 2017-05-13 ENCOUNTER — Encounter (HOSPITAL_COMMUNITY): Payer: Self-pay | Admitting: Emergency Medicine

## 2017-05-13 DIAGNOSIS — I10 Essential (primary) hypertension: Secondary | ICD-10-CM | POA: Diagnosis not present

## 2017-05-13 DIAGNOSIS — Z791 Long term (current) use of non-steroidal anti-inflammatories (NSAID): Secondary | ICD-10-CM | POA: Diagnosis not present

## 2017-05-13 DIAGNOSIS — E11621 Type 2 diabetes mellitus with foot ulcer: Secondary | ICD-10-CM | POA: Diagnosis present

## 2017-05-13 DIAGNOSIS — Z8249 Family history of ischemic heart disease and other diseases of the circulatory system: Secondary | ICD-10-CM | POA: Diagnosis not present

## 2017-05-13 DIAGNOSIS — F1990 Other psychoactive substance use, unspecified, uncomplicated: Secondary | ICD-10-CM | POA: Diagnosis not present

## 2017-05-13 DIAGNOSIS — K3184 Gastroparesis: Secondary | ICD-10-CM | POA: Diagnosis not present

## 2017-05-13 DIAGNOSIS — M86171 Other acute osteomyelitis, right ankle and foot: Secondary | ICD-10-CM | POA: Diagnosis not present

## 2017-05-13 DIAGNOSIS — T8743 Infection of amputation stump, right lower extremity: Principal | ICD-10-CM | POA: Diagnosis present

## 2017-05-13 DIAGNOSIS — M8610 Other acute osteomyelitis, unspecified site: Secondary | ICD-10-CM | POA: Diagnosis not present

## 2017-05-13 DIAGNOSIS — N183 Chronic kidney disease, stage 3 unspecified: Secondary | ICD-10-CM | POA: Diagnosis present

## 2017-05-13 DIAGNOSIS — Z79891 Long term (current) use of opiate analgesic: Secondary | ICD-10-CM

## 2017-05-13 DIAGNOSIS — L03039 Cellulitis of unspecified toe: Secondary | ICD-10-CM | POA: Diagnosis not present

## 2017-05-13 DIAGNOSIS — E138 Other specified diabetes mellitus with unspecified complications: Secondary | ICD-10-CM

## 2017-05-13 DIAGNOSIS — Y848 Other medical procedures as the cause of abnormal reaction of the patient, or of later complication, without mention of misadventure at the time of the procedure: Secondary | ICD-10-CM | POA: Diagnosis present

## 2017-05-13 DIAGNOSIS — R609 Edema, unspecified: Secondary | ICD-10-CM | POA: Diagnosis not present

## 2017-05-13 DIAGNOSIS — Z79899 Other long term (current) drug therapy: Secondary | ICD-10-CM

## 2017-05-13 DIAGNOSIS — Z794 Long term (current) use of insulin: Secondary | ICD-10-CM

## 2017-05-13 DIAGNOSIS — L97509 Non-pressure chronic ulcer of other part of unspecified foot with unspecified severity: Secondary | ICD-10-CM

## 2017-05-13 DIAGNOSIS — M4620 Osteomyelitis of vertebra, site unspecified: Secondary | ICD-10-CM | POA: Diagnosis not present

## 2017-05-13 DIAGNOSIS — E785 Hyperlipidemia, unspecified: Secondary | ICD-10-CM | POA: Diagnosis present

## 2017-05-13 DIAGNOSIS — E1122 Type 2 diabetes mellitus with diabetic chronic kidney disease: Secondary | ICD-10-CM | POA: Diagnosis present

## 2017-05-13 DIAGNOSIS — M868X7 Other osteomyelitis, ankle and foot: Secondary | ICD-10-CM | POA: Diagnosis not present

## 2017-05-13 DIAGNOSIS — F17210 Nicotine dependence, cigarettes, uncomplicated: Secondary | ICD-10-CM | POA: Diagnosis not present

## 2017-05-13 DIAGNOSIS — E1151 Type 2 diabetes mellitus with diabetic peripheral angiopathy without gangrene: Secondary | ICD-10-CM | POA: Diagnosis present

## 2017-05-13 DIAGNOSIS — F1910 Other psychoactive substance abuse, uncomplicated: Secondary | ICD-10-CM | POA: Diagnosis not present

## 2017-05-13 DIAGNOSIS — I129 Hypertensive chronic kidney disease with stage 1 through stage 4 chronic kidney disease, or unspecified chronic kidney disease: Secondary | ICD-10-CM | POA: Diagnosis not present

## 2017-05-13 DIAGNOSIS — E1169 Type 2 diabetes mellitus with other specified complication: Secondary | ICD-10-CM | POA: Diagnosis present

## 2017-05-13 DIAGNOSIS — E11628 Type 2 diabetes mellitus with other skin complications: Secondary | ICD-10-CM | POA: Diagnosis not present

## 2017-05-13 DIAGNOSIS — M869 Osteomyelitis, unspecified: Secondary | ICD-10-CM

## 2017-05-13 DIAGNOSIS — M7989 Other specified soft tissue disorders: Secondary | ICD-10-CM | POA: Diagnosis not present

## 2017-05-13 DIAGNOSIS — L97518 Non-pressure chronic ulcer of other part of right foot with other specified severity: Secondary | ICD-10-CM | POA: Diagnosis not present

## 2017-05-13 DIAGNOSIS — K219 Gastro-esophageal reflux disease without esophagitis: Secondary | ICD-10-CM | POA: Diagnosis present

## 2017-05-13 DIAGNOSIS — R600 Localized edema: Secondary | ICD-10-CM | POA: Diagnosis not present

## 2017-05-13 DIAGNOSIS — Z89429 Acquired absence of other toe(s), unspecified side: Secondary | ICD-10-CM | POA: Diagnosis not present

## 2017-05-13 DIAGNOSIS — E1143 Type 2 diabetes mellitus with diabetic autonomic (poly)neuropathy: Secondary | ICD-10-CM | POA: Diagnosis present

## 2017-05-13 DIAGNOSIS — E119 Type 2 diabetes mellitus without complications: Secondary | ICD-10-CM

## 2017-05-13 HISTORY — DX: Non-pressure chronic ulcer of other part of unspecified foot with unspecified severity: L97.509

## 2017-05-13 HISTORY — DX: Type 2 diabetes mellitus with foot ulcer: E11.621

## 2017-05-13 LAB — COMPREHENSIVE METABOLIC PANEL
ALT: 23 U/L (ref 17–63)
AST: 22 U/L (ref 15–41)
Albumin: 3.1 g/dL — ABNORMAL LOW (ref 3.5–5.0)
Alkaline Phosphatase: 97 U/L (ref 38–126)
Anion gap: 9 (ref 5–15)
BUN: 21 mg/dL — AB (ref 6–20)
CO2: 26 mmol/L (ref 22–32)
CREATININE: 1.95 mg/dL — AB (ref 0.61–1.24)
Calcium: 9.1 mg/dL (ref 8.9–10.3)
Chloride: 96 mmol/L — ABNORMAL LOW (ref 101–111)
GFR, EST AFRICAN AMERICAN: 48 mL/min — AB (ref >60)
GFR, EST NON AFRICAN AMERICAN: 41 mL/min — AB (ref >60)
Glucose, Bld: 526 mg/dL (ref 65–99)
Potassium: 4.6 mmol/L (ref 3.5–5.1)
Sodium: 131 mmol/L — ABNORMAL LOW (ref 135–145)
TOTAL PROTEIN: 7.9 g/dL (ref 6.5–8.1)
Total Bilirubin: 0.4 mg/dL (ref 0.3–1.2)

## 2017-05-13 LAB — CBC WITH DIFFERENTIAL/PLATELET
BASOS ABS: 0 10*3/uL (ref 0.0–0.1)
Basophils Relative: 0 %
EOS ABS: 0.3 10*3/uL (ref 0.0–0.7)
EOS PCT: 4 %
HCT: 35.8 % — ABNORMAL LOW (ref 39.0–52.0)
Hemoglobin: 12 g/dL — ABNORMAL LOW (ref 13.0–17.0)
Lymphocytes Relative: 26 %
Lymphs Abs: 1.7 10*3/uL (ref 0.7–4.0)
MCH: 30.2 pg (ref 26.0–34.0)
MCHC: 33.5 g/dL (ref 30.0–36.0)
MCV: 89.9 fL (ref 78.0–100.0)
Monocytes Absolute: 0.3 10*3/uL (ref 0.1–1.0)
Monocytes Relative: 5 %
NEUTROS PCT: 65 %
Neutro Abs: 4.4 10*3/uL (ref 1.7–7.7)
PLATELETS: 237 10*3/uL (ref 150–400)
RBC: 3.98 MIL/uL — AB (ref 4.22–5.81)
RDW: 13.1 % (ref 11.5–15.5)
WBC: 6.8 10*3/uL (ref 4.0–10.5)

## 2017-05-13 LAB — I-STAT CG4 LACTIC ACID, ED: Lactic Acid, Venous: 1.63 mmol/L (ref 0.5–1.9)

## 2017-05-13 LAB — SEDIMENTATION RATE: Sed Rate: 51 mm/hr — ABNORMAL HIGH (ref 0–16)

## 2017-05-13 LAB — HEMOGLOBIN A1C
Hgb A1c MFr Bld: 14.2 % — ABNORMAL HIGH (ref 4.8–5.6)
Mean Plasma Glucose: 360.84 mg/dL

## 2017-05-13 LAB — GLUCOSE, CAPILLARY: Glucose-Capillary: 391 mg/dL — ABNORMAL HIGH (ref 65–99)

## 2017-05-13 LAB — C-REACTIVE PROTEIN: CRP: 2.3 mg/dL — ABNORMAL HIGH (ref <1.0)

## 2017-05-13 LAB — PREALBUMIN: PREALBUMIN: 16.4 mg/dL — AB (ref 18–38)

## 2017-05-13 MED ORDER — SODIUM CHLORIDE 0.9 % IV BOLUS (SEPSIS)
1000.0000 mL | Freq: Once | INTRAVENOUS | Status: AC
  Administered 2017-05-13: 1000 mL via INTRAVENOUS

## 2017-05-13 MED ORDER — ATORVASTATIN CALCIUM 10 MG PO TABS
10.0000 mg | ORAL_TABLET | Freq: Every day | ORAL | Status: DC
Start: 2017-05-14 — End: 2017-05-16
  Administered 2017-05-14 – 2017-05-16 (×2): 10 mg via ORAL
  Filled 2017-05-13 (×2): qty 1

## 2017-05-13 MED ORDER — GABAPENTIN 100 MG PO CAPS
200.0000 mg | ORAL_CAPSULE | Freq: Three times a day (TID) | ORAL | Status: DC | PRN
  Filled 2017-05-13 (×2): qty 2

## 2017-05-13 MED ORDER — ONDANSETRON HCL 4 MG/2ML IJ SOLN: 4.0000 mg | Freq: Four times a day (QID) | INTRAMUSCULAR | Status: DC | PRN

## 2017-05-13 MED ORDER — INSULIN GLARGINE 100 UNIT/ML ~~LOC~~ SOLN
15.0000 [IU] | Freq: Every day | SUBCUTANEOUS | Status: DC
  Administered 2017-05-13: 15 [IU] via SUBCUTANEOUS
  Filled 2017-05-13: qty 0.15

## 2017-05-13 MED ORDER — OXYCODONE-ACETAMINOPHEN 5-325 MG PO TABS
1.0000 | ORAL_TABLET | Freq: Four times a day (QID) | ORAL | Status: DC | PRN
  Administered 2017-05-13 – 2017-05-16 (×6): 1 via ORAL
  Filled 2017-05-13 (×7): qty 1

## 2017-05-13 MED ORDER — INSULIN ASPART 100 UNIT/ML ~~LOC~~ SOLN
0.0000 [IU] | Freq: Three times a day (TID) | SUBCUTANEOUS | Status: DC
  Administered 2017-05-14: 9 [IU] via SUBCUTANEOUS
  Administered 2017-05-14: 7 [IU] via SUBCUTANEOUS
  Administered 2017-05-14: 5 [IU] via SUBCUTANEOUS
  Administered 2017-05-15 (×2): 7 [IU] via SUBCUTANEOUS
  Administered 2017-05-15: 5 [IU] via SUBCUTANEOUS
  Administered 2017-05-16: 9 [IU] via SUBCUTANEOUS

## 2017-05-13 MED ORDER — INSULIN ASPART 100 UNIT/ML ~~LOC~~ SOLN
0.0000 [IU] | Freq: Every day | SUBCUTANEOUS | Status: DC
  Administered 2017-05-13: 5 [IU] via SUBCUTANEOUS
  Administered 2017-05-14: 4 [IU] via SUBCUTANEOUS

## 2017-05-13 MED ORDER — SODIUM CHLORIDE 0.9% FLUSH: 3.0000 mL | INTRAVENOUS | Status: DC | PRN

## 2017-05-13 MED ORDER — ONDANSETRON HCL 4 MG PO TABS: 4.0000 mg | ORAL_TABLET | Freq: Four times a day (QID) | ORAL | Status: DC | PRN

## 2017-05-13 MED ORDER — LISINOPRIL 10 MG PO TABS
10.0000 mg | ORAL_TABLET | ORAL | Status: DC
  Administered 2017-05-14 – 2017-05-16 (×3): 10 mg via ORAL
  Filled 2017-05-13 (×3): qty 1

## 2017-05-13 MED ORDER — SODIUM CHLORIDE 0.9% FLUSH
3.0000 mL | Freq: Two times a day (BID) | INTRAVENOUS | Status: DC
  Administered 2017-05-13 – 2017-05-15 (×3): 3 mL via INTRAVENOUS

## 2017-05-13 MED ORDER — HYDROCODONE-ACETAMINOPHEN 5-325 MG PO TABS
2.0000 | ORAL_TABLET | Freq: Once | ORAL | Status: AC
  Administered 2017-05-13: 2 via ORAL
  Filled 2017-05-13 (×2): qty 2

## 2017-05-13 MED ORDER — SODIUM CHLORIDE 0.9 % IV SOLN: 250.0000 mL | INTRAVENOUS | Status: DC | PRN

## 2017-05-13 NOTE — ED Provider Notes (Signed)
[facility] Provider Note   CSN: [id number] Arrival date & time: 05/13/17  1200     History   Chief Complaint Chief Complaint  Patient presents with  . Foot Pain    HPI [name] is a 41 y.o. male.  HPI  41 year old male presents with right foot pain and drainage.  Started 1 week ago.  Started off as a blister at his stump when he got new diabetic shoes.  Since then it ruptured and has had brown drainage and foul-smelling drainage.  There has been no fevers.  He feels swollen from his foot to his mid shin.  No redness.  No calf pain.  No fevers, chest pain, shortness of breath.  His previous mid-foot amputation was by Dr. Lajoyce Cornersuda  Past Medical History:  Diagnosis Date  . Acute osteomyelitis, ankle and foot 07/30/2010   Qualifier: Diagnosis of  By: Daiva EvesVan Dam MD, Remi Haggardornelius    . Anemia   . Chronic kidney disease (CKD), stage III (moderate) (HCC)   . Collagen vascular disease (HCC)   . DDD (degenerative disc disease), lumbar   . Dehiscence of amputation stump (HCC)     dehiscence right transmetetarsal amputation achilles contracture  . Gastroparesis   . GERD (gastroesophageal reflux disease)   . Headache   . Hiatal hernia   . Hyperlipidemia   . Hypertension   . Pancreatitis   . Peripheral vascular disease (HCC)   . Polysubstance abuse (HCC) 03/08/2016  . Renal insufficiency   . Type II diabetes mellitus (HCC) dx'd ~ 1996  . Vascular disease    poor circulation to left foot    Patient Active Problem List   Diagnosis Date Noted  . Diabetic polyneuropathy associated with type 2 diabetes mellitus (HCC) 07/10/2016  . Status post transmetatarsal amputation of foot, right (HCC) 07/10/2016  . Acquired contracture of Achilles tendon, right   . Acquired absence of right foot (HCC) 05/14/2016  . Polysubstance abuse (HCC) 03/08/2016  . Tobacco abuse 02/27/2016  . Gastroparesis 05/28/2015  . GERD (gastroesophageal reflux disease) 10/01/2014    . Right foot infection 10/01/2014  . Esophageal reflux   . Fever 09/27/2014  . Abdominal pain, lower 09/27/2014  . Abnormal ECG 05/30/2014  . Chest pain 05/30/2014  . Ankle pain 05/30/2014  . Pain in the chest   . Nausea 08/30/2013  . Epigastric abdominal pain 08/30/2013  . Low grade fever 08/30/2013  . Diabetic foot ulcer with osteomyelitis (HCC) 05/12/2013  . Diabetic osteomyelitis b/l toes 03/23/2013  . DM (diabetes mellitus) type II uncontrolled, periph vascular disorder (HCC) 03/23/2013  . CKD (chronic kidney disease), stage III (HCC) 03/23/2013  . Anemia 03/23/2013  . Benign essential HTN 03/23/2013  . AKI (acute kidney injury) (HCC) 03/22/2013  . Viral gastroenteritis 09/17/2012  . Nausea & vomiting 09/16/2012  . Acute pancreatitis 09/16/2012  . Diabetes mellitus (HCC) 09/20/2010  . Onychomycosis 09/20/2010  . METHICILLIN SUSCEPTIBLE STAPH AUREUS SEPTICEMIA 07/30/2010    Past Surgical History:  Procedure Laterality Date  . AMPUTATION  04/25/2011   Procedure: AMPUTATION DIGIT;  Surgeon: Nadara MustardMarcus V Duda, MD;  [facility];  Service: Orthopedics;  Laterality: Left;  Left foot 3rd toe amputation MTP joint, Gastroc Recession  Achilles Lengthening   . AMPUTATION Bilateral 03/25/2013   Procedure: AMPUTATION RAY;  Surgeon: Nadara MustardMarcus V Duda, MD;  Location: [facility] OR;  Service: Orthopedics;  Laterality: Bilateral;  Left Great Toe Amputation at  MTP Joint, Right 1st and 2nd Ray Amputation   .  AMPUTATION Right 10/03/2014   Procedure: AMPUTATION MIDFOOT;  Surgeon: Nadara Mustard, MD;  Location: [facility] OR;  Service: Orthopedics;  Laterality: Right;  . LAPAROSCOPIC CHOLECYSTECTOMY    . STUMP REVISION Right 05/23/2016   Procedure: Revision Right Transmetatarsal Amputation, Right Gastrocnemius Recession;  Surgeon: Nadara Mustard, MD;  Location: [facility] OR;  Service: Orthopedics;  Laterality: Right;  . TOE AMPUTATION  2012   left foot; great toe and second toe       Home Medications    Prior to  Admission medications   Medication Sig Start Date End Date Taking? Authorizing Provider  gabapentin (NEURONTIN) 100 MG capsule Take 2 capsules (200 mg total) by mouth 3 (three) times daily. 08/22/16   Deatra Canter, FNP  insulin aspart (NOVOLOG) 100 UNIT/ML injection Inject 3 Units into the skin daily. 08/22/16   Deatra Canter, FNP  insulin glargine (LANTUS) 100 UNIT/ML injection Inject 0.15 mLs (15 Units total) into the skin at bedtime. 08/22/16   Deatra Canter, FNP  lisinopril (PRINIVIL,ZESTRIL) 10 MG tablet Take 1 tablet (10 mg total) by mouth every morning. 08/22/16   Deatra Canter, FNP  lisinopril (PRINIVIL,ZESTRIL) 20 MG tablet  01/07/17   [provider]  meloxicam (MOBIC) 7.5 MG tablet Take 1 tablet (7.5 mg total) by mouth daily. 10/01/16   Hayden Rasmussen, NP  mupirocin ointment (BACTROBAN) 2 % Apply 1 application topically 2 (two) times daily. 09/04/16   Adonis Huguenin, NP  oxyCODONE-acetaminophen (PERCOCET/ROXICET) 5-325 MG tablet Take 1 tablet by mouth every 6 (six) hours as needed for severe pain. 07/10/16   Nadara Mustard, MD  traMADol (ULTRAM) 50 MG tablet Take 1 tablet (50 mg total) by mouth every 6 (six) hours as needed. For pain. Use sparingly. 10/01/16   Hayden Rasmussen, NP    Family History Family History  Problem Relation Age of Onset  . Heart attack Father 62  . Hypertension Sister     Social History Social History   Tobacco Use  . Smoking status: Current Some Day Smoker    Packs/day: 0.10    Years: 4.00    Pack years: 0.40    Types: Cigarettes  . Smokeless tobacco: Never Used  Substance Use Topics  . Alcohol use: No  . Drug use: Yes    Frequency: 10.0 times per week    Types: Marijuana     Allergies   No known allergies   Review of Systems Review of Systems  Constitutional: Negative for fever.  Respiratory: Negative for shortness of breath.   Cardiovascular: Positive for leg swelling. Negative for chest pain.  Musculoskeletal: Positive for  arthralgias.  Skin: Positive for wound. Negative for color change.  All other systems reviewed and are negative.    Physical Exam Updated Vital Signs BP (!) 133/92   Pulse 83   Temp 97.6 F (36.4 C) (Oral)   Resp 12   Ht 6\' 1"  (1.854 m)   Wt 120.2 kg (265 lb)   SpO2 100%   BMI 34.96 kg/m   Physical Exam  Constitutional: He is oriented to person, place, and time. He appears well-developed and well-nourished.  HENT:  Head: Normocephalic and atraumatic.  Right Ear: External ear normal.  Left Ear: External ear normal.  Nose: Nose normal.  Eyes: Right eye exhibits no discharge. Left eye exhibits no discharge.  Neck: Neck supple.  Cardiovascular: Normal rate, regular rhythm and normal heart sounds.  Pulmonary/Chest: Effort normal and breath sounds normal.  Abdominal: Soft.  There is no tenderness.  Musculoskeletal: He exhibits no edema.       Right lower leg: He exhibits swelling (lower leg, anterior). He exhibits no tenderness.       Right foot: There is swelling. There is no tenderness.       Feet:  Neurological: He is alert and oriented to person, place, and time.  Skin: Skin is warm and dry.  Nursing note and vitals reviewed.      ED Treatments / Results  Labs (all labs ordered are listed, but only abnormal results are displayed) Labs Reviewed  COMPREHENSIVE METABOLIC PANEL - Abnormal; Notable for the following components:      Result Value   Sodium 131 (*)    Chloride 96 (*)    Glucose, Bld 526 (*)    BUN 21 (*)    Creatinine, Ser 1.95 (*)    Albumin 3.1 (*)    GFR calc non Af Amer 41 (*)    GFR calc Af Amer 48 (*)    All other components within normal limits  CBC WITH DIFFERENTIAL/PLATELET - Abnormal; Notable for the following components:   RBC 3.98 (*)    Hemoglobin 12.0 (*)    HCT 35.8 (*)    All other components within normal limits  I-STAT CG4 LACTIC ACID, ED  I-STAT CG4 LACTIC ACID, ED    EKG  EKG Interpretation None       Radiology Dg  Foot Complete Right  Result Date: 05/13/2017 CLINICAL DATA:  Foul-smelling discharge from top of foot for 1 week, history diabetes mellitus EXAM: RIGHT FOOT COMPLETE - 3+ VIEW COMPARISON:  10/27/2016 FINDINGS: Prior transmetatarsal amputation of second through fifth rays with amputation of the entirety of the first metatarsal. Distal soft tissue swelling with soft tissue irregularity and gas at the distal stump. Bones demineralized. No acute fracture or dislocation. Question new cortical loss at the volar aspect of the stump of the second metatarsal concerning for acute osteomyelitis, new since prior exam. IMPRESSION: Distal soft tissue swelling and gas with suspected new bone destruction at the distal aspect of the second metatarsal stump, concerning for acute osteomyelitis. Electronically Signed   By: Ulyses SouthwardMark  Boles [name]   On: 05/13/2017 18:19    Procedures Procedures (including critical care time)  Medications Ordered in ED Medications  HYDROcodone-acetaminophen (NORCO/VICODIN) 5-325 MG per tablet 2 tablet (2 tablets Oral Given 05/13/17 1837)  sodium chloride 0.9 % bolus 1,000 mL (1,000 mLs Intravenous New Bag/Given 05/13/17 1840)  sodium chloride 0.9 % bolus 1,000 mL (1,000 mLs Intravenous New Bag/Given 05/13/17 1840)     Initial Impression / Assessment and Plan / ED Course  I have reviewed the triage vital signs and the nursing notes.  Pertinent labs & imaging results that were available during my care of the patient were reviewed by me and considered in my medical decision making (see chart for details).     X-ray confirms acute osteomyelitis.  This matches clinically with his deep wound and diabetes.  However he is not septic or systemically ill.  Discussed with Dr. Onalee Huaavid, will hold off on antibiotics at her discretion to help her get biopsies/cultures in the morning.  Final Clinical Impressions(s) / ED Diagnoses   Final diagnoses:  Acute osteomyelitis [facility](HCC)    ED Discharge Orders     None       Pricilla LovelessGoldston, Vanette Noguchi, MD 05/13/17 [phone]

## 2017-05-13 NOTE — ED Triage Notes (Signed)
Pt reports lesion on top of right foot that has been draining a foul smelling discharge x1 week, hx of diabetes with uncontrolled sugars. Pt denies fever or chills.

## 2017-05-13 NOTE — H&P (Signed)
History and Physical    Curtis FettersLamont Isola AOZ:[id number]:[id number] DOB: 09/23/1975 DOA: 05/13/2017  PCP: Darrow BussingKoirala, Dibas, MD  Patient coming from:  home  Chief Complaint:   Right foot wound  HPI: [name] is a 41 y.o. male with medical history significant of polysubstance abuse, DM, amputee of right forefoot by dr duda over a year ago comes in with open foul smelling wound to right forefoot that has been worsening for a week, with foul odor draining with no redness.  Pt says his shoe caused a wound and it started with a blister then popped and is now bad.  He denies any recent abx.  No n/v/d.  Pt referred for admission for concerns of new osteomyelitis of the foot.  Sugar is over 500.  Review of Systems: As per HPI otherwise 10 point review of systems negative.   Past Medical History:  Diagnosis Date  . Acute osteomyelitis, ankle and foot 07/30/2010   Qualifier: Diagnosis of  By: Daiva EvesVan Dam MD, Remi Haggardornelius    . Anemia   . Chronic kidney disease (CKD), stage III (moderate) (HCC)   . Collagen vascular disease (HCC)   . DDD (degenerative disc disease), lumbar   . Dehiscence of amputation stump (HCC)     dehiscence right transmetetarsal amputation achilles contracture  . Gastroparesis   . GERD (gastroesophageal reflux disease)   . Headache   . Hiatal hernia   . Hyperlipidemia   . Hypertension   . Pancreatitis   . Peripheral vascular disease (HCC)   . Polysubstance abuse (HCC) 03/08/2016  . Renal insufficiency   . Type II diabetes mellitus (HCC) dx'd ~ 1996  . Vascular disease    poor circulation to left foot    Past Surgical History:  Procedure Laterality Date  . AMPUTATION  04/25/2011   Procedure: AMPUTATION DIGIT;  Surgeon: Nadara MustardMarcus V Duda, MD;  [facility];  Service: Orthopedics;  Laterality: Left;  Left foot 3rd toe amputation MTP joint, Gastroc Recession  Achilles Lengthening   . AMPUTATION Bilateral 03/25/2013   Procedure: AMPUTATION RAY;  Surgeon: Nadara MustardMarcus V Duda, MD;  Location: [facility] OR;   Service: Orthopedics;  Laterality: Bilateral;  Left Great Toe Amputation at  MTP Joint, Right 1st and 2nd Ray Amputation   . AMPUTATION Right 10/03/2014   Procedure: AMPUTATION MIDFOOT;  Surgeon: Nadara MustardMarcus Duda V, MD;  [facility];  Service: Orthopedics;  Laterality: Right;  . LAPAROSCOPIC CHOLECYSTECTOMY    . STUMP REVISION Right 05/23/2016   Procedure: Revision Right Transmetatarsal Amputation, Right Gastrocnemius Recession;  Surgeon: Nadara MustardMarcus V Duda, MD;  Location: [facility] OR;  Service: Orthopedics;  Laterality: Right;  . TOE AMPUTATION  2012   left foot; great toe and second toe     reports that he has been smoking cigarettes.  He has a 0.40 pack-year smoking history. he has never used smokeless tobacco. He reports that he uses drugs. Drug: Marijuana. Frequency: 10.00 times per week. He reports that he does not drink alcohol.  Allergies  Allergen Reactions  . No Known Allergies     Family History  Problem Relation Age of Onset  . Heart attack Father [id number]  . Hypertension Sister     Prior to Admission medications   Medication Sig Start Date End Date Taking? Authorizing Provider  atorvastatin (LIPITOR) 10 MG tablet Take 10 mg by mouth daily. 03/29/17  Yes [provider]  gabapentin (NEURONTIN) 100 MG capsule Take 2 capsules (200 mg total) by mouth 3 (three) times daily. Patient taking  differently: Take 200 mg by mouth 3 (three) times daily as needed (nerve pain).  08/22/16  Yes Deatra Canterxford, William J, FNP  insulin aspart (NOVOLOG) 100 UNIT/ML injection Inject 3 Units into the skin daily. 08/22/16  Yes Deatra Canterxford, William J, FNP  insulin glargine (LANTUS) 100 UNIT/ML injection Inject 0.15 mLs (15 Units total) into the skin at bedtime. 08/22/16  Yes Deatra Canterxford, William J, FNP  lisinopril (PRINIVIL,ZESTRIL) 10 MG tablet Take 1 tablet (10 mg total) by mouth every morning. 08/22/16  Yes Deatra Canterxford, William J, FNP  meloxicam (MOBIC) 7.5 MG tablet Take 1 tablet (7.5 mg total) by mouth daily. Patient taking  differently: Take 7.5 mg by mouth daily as needed for pain.  10/01/16  Yes Mabe, Onalee Huaavid, NP  oxyCODONE-acetaminophen (PERCOCET/ROXICET) 5-325 MG tablet Take 1 tablet by mouth every 6 (six) hours as needed for severe pain. 07/10/16  Yes Nadara Mustarduda, Marcus V, MD  traMADol (ULTRAM) 50 MG tablet Take 1 tablet (50 mg total) by mouth every 6 (six) hours as needed. For pain. Use sparingly. 10/01/16  Yes Mabe, Onalee Huaavid, NP  mupirocin ointment (BACTROBAN) 2 % Apply 1 application topically 2 (two) times daily. Patient not taking: Reported on 05/13/2017 09/04/16   Adonis HugueninZamora, Erin R, NP    Physical Exam: Vitals:   05/13/17 1252 05/13/17 1845 05/13/17 1848 05/13/17 1930  BP: (!) 153/97 (!) 133/92  (!) 140/97  Pulse: 86 83  79  Resp: 12     Temp: 97.6 F (36.4 C)     TempSrc: Oral     SpO2: 100% 100%  100%  Weight:   120.2 kg (265 lb)   Height:   6\' 1"  (1.854 m)       Constitutional: NAD, calm, comfortable Vitals:   05/13/17 1252 05/13/17 1845 05/13/17 1848 05/13/17 1930  BP: (!) 153/97 (!) 133/92  (!) 140/97  Pulse: 86 83  79  Resp: 12     Temp: 97.6 F (36.4 C)     TempSrc: Oral     SpO2: 100% 100%  100%  Weight:   120.2 kg (265 lb)   Height:   6\' 1"  (1.854 m)    Eyes: PERRL, lids and conjunctivae normal ENMT: Mucous membranes are moist. Posterior pharynx clear of any exudate or lesions.Normal dentition.  Neck: normal, supple, no masses, no thyromegaly Respiratory: clear to auscultation bilaterally, no wheezing, no crackles. Normal respiratory effort. No accessory muscle use.  Cardiovascular: Regular rate and rhythm, no murmurs / rubs / gallops. No extremity edema. 2+ pedal pulses. No carotid bruits.  Abdomen: no tenderness, no masses palpated. No hepatosplenomegaly. Bowel sounds positive.  Musculoskeletal: no clubbing / cyanosis. No joint deformity upper and lower extremities. Good ROM, no contractures. Normal muscle tone.  Skin: right forefoot with open wound with purulent drainage and foul odor with  no surroounding erythama Neurologic: CN 2-12 grossly intact. Sensation intact, DTR normal. Strength 5/5 in all 4.  Psychiatric: Normal judgment and insight. Alert and oriented x 3. Normal mood.    Labs on Admission: I have personally reviewed following labs and imaging studies  CBC: Recent Labs  Lab 05/13/17 1301  WBC 6.8  NEUTROABS 4.4  HGB 12.0*  HCT 35.8*  MCV 89.9  PLT 237   Basic Metabolic Panel: Recent Labs  Lab 05/13/17 1301  NA 131*  K 4.6  CL 96*  CO2 26  GLUCOSE 526*  BUN 21*  CREATININE 1.95*  CALCIUM 9.1   GFR: Estimated Creatinine Clearance: 68.4 mL/min (A) (by C-G formula based  on SCr of 1.95 mg/dL (H)). Liver Function Tests: Recent Labs  Lab 05/13/17 1301  AST 22  ALT 23  ALKPHOS 97  BILITOT 0.4  PROT 7.9  ALBUMIN 3.1*   No results for input(s): LIPASE, AMYLASE in the last 168 hours. No results for input(s): AMMONIA in the last 168 hours. Coagulation Profile: No results for input(s): INR, PROTIME in the last 168 hours. Cardiac Enzymes: No results for input(s): CKTOTAL, CKMB, CKMBINDEX, TROPONINI in the last 168 hours. BNP (last 3 results) No results for input(s): PROBNP in the last 8760 hours. HbA1C: No results for input(s): HGBA1C in the last 72 hours. CBG: No results for input(s): GLUCAP in the last 168 hours. Lipid Profile: No results for input(s): CHOL, HDL, LDLCALC, TRIG, CHOLHDL, LDLDIRECT in the last 72 hours. Thyroid Function Tests: No results for input(s): TSH, T4TOTAL, FREET4, T3FREE, THYROIDAB in the last 72 hours. Anemia Panel: No results for input(s): VITAMINB12, FOLATE, FERRITIN, TIBC, IRON, RETICCTPCT in the last 72 hours. Urine analysis:    Component Value Date/Time   COLORURINE YELLOW 05/13/2016 0925   APPEARANCEUR CLEAR 05/13/2016 0925   LABSPEC >=1.030 08/22/2016 1941   PHURINE 6.0 08/22/2016 1941   GLUCOSEU NEGATIVE 08/22/2016 1941   HGBUR TRACE (A) 08/22/2016 1941   BILIRUBINUR NEGATIVE 08/22/2016 1941    KETONESUR NEGATIVE 08/22/2016 1941   PROTEINUR >=300 (A) 08/22/2016 1941   UROBILINOGEN 1.0 08/22/2016 1941   NITRITE NEGATIVE 08/22/2016 1941   LEUKOCYTESUR NEGATIVE 08/22/2016 1941   Sepsis Labs: !!!!!!!!!!!!!!!!!!!!!!!!!!!!!!!!!!!!!!!!!!!! @LABRCNTIP (procalcitonin:4,lacticidven:4) )No results found for this or any previous visit (from the past 240 hour(s)).   Radiological Exams on Admission: Dg Foot Complete Right  Result Date: 05/13/2017 CLINICAL DATA:  Foul-smelling discharge from top of foot for 1 week, history diabetes mellitus EXAM: RIGHT FOOT COMPLETE - 3+ VIEW COMPARISON:  10/27/2016 FINDINGS: Prior transmetatarsal amputation of second through fifth rays with amputation of the entirety of the first metatarsal. Distal soft tissue swelling with soft tissue irregularity and gas at the distal stump. Bones demineralized. No acute fracture or dislocation. Question new cortical loss at the volar aspect of the stump of the second metatarsal concerning for acute osteomyelitis, new since prior exam. IMPRESSION: Distal soft tissue swelling and gas with suspected new bone destruction at the distal aspect of the second metatarsal stump, concerning for acute osteomyelitis. Electronically Signed   By: Ulyses Southward [name]   On: 05/13/2017 18:19    Old chart reviewed Case discussed with edp  Assessment/Plan 41 yo diabetic male with right diabetic foot infection and likely osteomyelitis  Principal Problem:   Diabetic foot ulcer with osteomyelitis (HCC)- hold off on abx . Obtain mri of foot.  Will need to call dr duda in am.  Pt afebrile, stable with normal wbc.  Hold off on abx until further evaluation.  utulized diabetic foot order set, pt very high risk for the need for further amputation  Active Problems:   Diabetes mellitus (HCC)- give lantus.  Ck hga1c.  Ssi.  Give coverage tonight.   CKD (chronic kidney disease), stage III (HCC)- at baseline   Benign essential HTN- stable   Esophageal reflux-  cont home meds   Gastroparesis- stable   Polysubstance abuse (HCC)- ck uds     DVT prophylaxis:  scds Code Status:  full Family Communication:  wife Disposition Plan:  Per day team Consults called:  None, will need to call dr duda in am Admission status: admit  Yamaira Spinner A MD [facility]  If 7PM-7AM, please  contact night-coverage [email or web address] Password [facility]  05/13/2017, 7:54 PM

## 2017-05-14 ENCOUNTER — Encounter (HOSPITAL_COMMUNITY): Payer: Self-pay | Admitting: *Deleted

## 2017-05-14 ENCOUNTER — Inpatient Hospital Stay (HOSPITAL_COMMUNITY): Payer: PPO

## 2017-05-14 ENCOUNTER — Other Ambulatory Visit (INDEPENDENT_AMBULATORY_CARE_PROVIDER_SITE_OTHER): Payer: Self-pay | Admitting: Family

## 2017-05-14 DIAGNOSIS — E1169 Type 2 diabetes mellitus with other specified complication: Secondary | ICD-10-CM

## 2017-05-14 DIAGNOSIS — E11621 Type 2 diabetes mellitus with foot ulcer: Secondary | ICD-10-CM

## 2017-05-14 DIAGNOSIS — M869 Osteomyelitis, unspecified: Secondary | ICD-10-CM

## 2017-05-14 DIAGNOSIS — L97509 Non-pressure chronic ulcer of other part of unspecified foot with unspecified severity: Secondary | ICD-10-CM

## 2017-05-14 HISTORY — DX: Type 2 diabetes mellitus with foot ulcer: E11.621

## 2017-05-14 HISTORY — DX: Type 2 diabetes mellitus with foot ulcer: L97.509

## 2017-05-14 LAB — GLUCOSE, CAPILLARY
GLUCOSE-CAPILLARY: 325 mg/dL — AB (ref 65–99)
Glucose-Capillary: 313 mg/dL — ABNORMAL HIGH (ref 65–99)
Glucose-Capillary: 254 mg/dL — ABNORMAL HIGH (ref 65–99)
Glucose-Capillary: 355 mg/dL — ABNORMAL HIGH (ref 65–99)

## 2017-05-14 LAB — CBC
HCT: 34.6 % — ABNORMAL LOW (ref 39.0–52.0)
HEMOGLOBIN: 11.3 g/dL — AB (ref 13.0–17.0)
MCH: 29.4 pg (ref 26.0–34.0)
MCHC: 32.7 g/dL (ref 30.0–36.0)
MCV: 89.9 fL (ref 78.0–100.0)
Platelets: 222 10*3/uL (ref 150–400)
RBC: 3.85 MIL/uL — AB (ref 4.22–5.81)
RDW: 13.2 % (ref 11.5–15.5)
WBC: 7.3 10*3/uL (ref 4.0–10.5)

## 2017-05-14 LAB — BASIC METABOLIC PANEL
ANION GAP: 9 (ref 5–15)
BUN: 19 mg/dL (ref 6–20)
CHLORIDE: 101 mmol/L (ref 101–111)
CO2: 23 mmol/L (ref 22–32)
Calcium: 8.7 mg/dL — ABNORMAL LOW (ref 8.9–10.3)
Creatinine, Ser: 1.6 mg/dL — ABNORMAL HIGH (ref 0.61–1.24)
GFR calc non Af Amer: 52 mL/min — ABNORMAL LOW (ref >60)
GLUCOSE: 318 mg/dL — AB (ref 65–99)
Potassium: 4.1 mmol/L (ref 3.5–5.1)
Sodium: 133 mmol/L — ABNORMAL LOW (ref 135–145)

## 2017-05-14 LAB — RAPID URINE DRUG SCREEN, HOSP PERFORMED
Amphetamines: NOT DETECTED
BARBITURATES: NOT DETECTED
Benzodiazepines: NOT DETECTED
COCAINE: POSITIVE — AB
Opiates: POSITIVE — AB
TETRAHYDROCANNABINOL: NOT DETECTED

## 2017-05-14 LAB — SURGICAL PCR SCREEN
MRSA, PCR: NEGATIVE
STAPHYLOCOCCUS AUREUS: NEGATIVE

## 2017-05-14 MED ORDER — PRO-STAT SUGAR FREE PO LIQD
30.0000 mL | Freq: Three times a day (TID) | ORAL | Status: DC
  Administered 2017-05-15 – 2017-05-16 (×2): 30 mL via ORAL
  Filled 2017-05-14 (×2): qty 30

## 2017-05-14 MED ORDER — INSULIN ASPART 100 UNIT/ML ~~LOC~~ SOLN
4.0000 [IU] | Freq: Three times a day (TID) | SUBCUTANEOUS | Status: DC
  Administered 2017-05-14 (×2): 4 [IU] via SUBCUTANEOUS

## 2017-05-14 MED ORDER — LIVING WELL WITH DIABETES BOOK
Freq: Once | Status: DC
  Filled 2017-05-14: qty 1

## 2017-05-14 MED ORDER — INSULIN GLARGINE 100 UNIT/ML ~~LOC~~ SOLN
20.0000 [IU] | Freq: Every day | SUBCUTANEOUS | Status: DC
  Administered 2017-05-14: 20 [IU] via SUBCUTANEOUS
  Filled 2017-05-14: qty 0.2

## 2017-05-14 MED ORDER — CHLORHEXIDINE GLUCONATE 4 % EX LIQD
60.0000 mL | Freq: Once | CUTANEOUS | Status: DC
  Filled 2017-05-14: qty 15

## 2017-05-14 MED ORDER — CEFAZOLIN SODIUM-DEXTROSE 2-4 GM/100ML-% IV SOLN
2.0000 g | INTRAVENOUS | Status: DC
  Filled 2017-05-14 (×2): qty 100

## 2017-05-14 NOTE — Consult Note (Addendum)
WOC Nurse wound consult note Reason for Consult: Consult requested for right foot full thickness wound.  X-ray results indicate; "Distal soft tissue swelling and gas with suspected new bone destruction at the distal aspect of the second metatarsal stump, concerning for acute osteomyelitis. This complex medical condition is beyond the scope of practice for WOC nurses.  Please refer to ortho service for further plan of care. Progress notes indicate a consult is pending from Dr Lajoyce Cornersuda.  Moist gauze dressing orders provided for bedside nurses to perform untl further input from ortho service is available. Please re-consult if further assistance is needed.  Thank-you,  Cammie Mcgeeawn Angelito Hopping MSN, RN, CWOCN, [place]-AP, CNS [phone]

## 2017-05-14 NOTE — Consult Note (Signed)
ORTHOPAEDIC CONSULTATION  REQUESTING PHYSICIAN: Cathren Harshai, Ripudeep K, MD  Chief Complaint: Ulceration abscess right transmetatarsal amputation.  HPI: [name] is a 41 y.o. male who presents with diabetic insensate neuropathy with peripheral vascular disease with a purulent drainage ulcer from the right transmetatarsal amputation.  Past Medical History:  Diagnosis Date  . Acute osteomyelitis, ankle and foot 07/30/2010   Qualifier: Diagnosis of  By: Daiva EvesVan Dam MD, Remi Haggardornelius    . Anemia   . Chronic kidney disease (CKD), stage III (moderate) (HCC)   . Collagen vascular disease (HCC)   . DDD (degenerative disc disease), lumbar   . Dehiscence of amputation stump (HCC)     dehiscence right transmetetarsal amputation achilles contracture  . Gastroparesis   . GERD (gastroesophageal reflux disease)   . Headache   . Hiatal hernia   . Hyperlipidemia   . Hypertension   . Pancreatitis   . Peripheral vascular disease (HCC)   . Polysubstance abuse (HCC) 03/08/2016  . Renal insufficiency   . Type II diabetes mellitus (HCC) dx'd ~ 1996  . Vascular disease    poor circulation to left foot   Past Surgical History:  Procedure Laterality Date  . AMPUTATION  04/25/2011   Procedure: AMPUTATION DIGIT;  Surgeon: Nadara MustardMarcus V Duda, MD;  [facility];  Service: Orthopedics;  Laterality: Left;  Left foot 3rd toe amputation MTP joint, Gastroc Recession  Achilles Lengthening   . AMPUTATION Bilateral 03/25/2013   Procedure: AMPUTATION RAY;  Surgeon: Nadara MustardMarcus V Duda, MD;  Location: [facility] OR;  Service: Orthopedics;  Laterality: Bilateral;  Left Great Toe Amputation at  MTP Joint, Right 1st and 2nd Ray Amputation   . AMPUTATION Right 10/03/2014   Procedure: AMPUTATION MIDFOOT;  Surgeon: Nadara MustardMarcus Duda V, MD;  [facility];  Service: Orthopedics;  Laterality: Right;  . LAPAROSCOPIC CHOLECYSTECTOMY    . STUMP REVISION Right 05/23/2016   Procedure: Revision Right Transmetatarsal Amputation, Right Gastrocnemius  Recession;  Surgeon: Nadara MustardMarcus V Duda, MD;  Location: [facility] OR;  Service: Orthopedics;  Laterality: Right;  . TOE AMPUTATION  2012   left foot; great toe and second toe   Social History   Socioeconomic History  . Marital status: Divorced    Spouse name: None  . Number of children: None  . Years of education: None  . Highest education level: None  Social Needs  . Financial resource strain: None  . Food insecurity - worry: None  . Food insecurity - inability: None  . Transportation needs - medical: None  . Transportation needs - non-medical: None  Occupational History  . None  Tobacco Use  . Smoking status: Current Some Day Smoker    Packs/day: 0.10    Years: 4.00    Pack years: 0.40    Types: Cigarettes  . Smokeless tobacco: Never Used  Substance and Sexual Activity  . Alcohol use: No  . Drug use: Yes    Frequency: 10.0 times per week    Types: Marijuana  . Sexual activity: Yes    Birth control/protection: None  Other Topics Concern  . None  Social History Narrative  . None   Family History  Problem Relation Age of Onset  . Heart attack Father 7552  . Hypertension Sister    - negative except otherwise stated in the family history section Allergies  Allergen Reactions  . No Known Allergies    Prior to Admission medications   Medication Sig Start Date End Date Taking? Authorizing Provider  atorvastatin (LIPITOR) 10  MG tablet Take 10 mg by mouth daily. 03/29/17  Yes [provider]  gabapentin (NEURONTIN) 100 MG capsule Take 2 capsules (200 mg total) by mouth 3 (three) times daily. Patient taking differently: Take 200 mg by mouth 3 (three) times daily as needed (nerve pain).  08/22/16  Yes Deatra Canterxford, William J, FNP  insulin aspart (NOVOLOG) 100 UNIT/ML injection Inject 3 Units into the skin daily. 08/22/16  Yes Deatra Canterxford, William J, FNP  insulin glargine (LANTUS) 100 UNIT/ML injection Inject 0.15 mLs (15 Units total) into the skin at bedtime. 08/22/16  Yes Deatra Canterxford, William  J, FNP  lisinopril (PRINIVIL,ZESTRIL) 10 MG tablet Take 1 tablet (10 mg total) by mouth every morning. 08/22/16  Yes Deatra Canterxford, William J, FNP  meloxicam (MOBIC) 7.5 MG tablet Take 1 tablet (7.5 mg total) by mouth daily. Patient taking differently: Take 7.5 mg by mouth daily as needed for pain.  10/01/16  Yes Mabe, Onalee Huaavid, NP  oxyCODONE-acetaminophen (PERCOCET/ROXICET) 5-325 MG tablet Take 1 tablet by mouth every 6 (six) hours as needed for severe pain. 07/10/16  Yes Nadara Mustarduda, Marcus V, MD  traMADol (ULTRAM) 50 MG tablet Take 1 tablet (50 mg total) by mouth every 6 (six) hours as needed. For pain. Use sparingly. 10/01/16  Yes Mabe, Onalee Huaavid, NP  mupirocin ointment (BACTROBAN) 2 % Apply 1 application topically 2 (two) times daily. Patient not taking: Reported on 05/13/2017 09/04/16   Adonis HugueninZamora, Erin R, NP   Mri Right Foot Without Contrast  Result Date: 05/14/2017 CLINICAL DATA:  Soft tissue wound at the stump of the patient's right foot with discharge. Diabetic patient. EXAM: MRI OF THE RIGHT FOREFOOT WITHOUT CONTRAST TECHNIQUE: Multiplanar, multisequence MR imaging of the right forefoot was performed. No intravenous contrast was administered. COMPARISON:  Plain films right foot 05/13/2017 and 10/27/2016. MRI right foot 02/27/2016. FINDINGS: Bones/Joint/Cartilage As seen on the most recent plain films, the patient is status post amputation of the first ray at the level of the tarsometatarsal joint. Second through fifth transmetatarsal amputations are also identified. Mild marrow edema is seen in the distal 2.4 cm of the second metatarsal remnant. Edema is most intense in the distal 0.7 cm of the metatarsal in its plantar aspect. Soft tissue edema surrounds the metatarsal remnant. Ligaments Intact. Muscles and Tendons No acute abnormality. There is some atrophy of intrinsic musculature the foot. No intramuscular fluid collection. Soft tissues Large skin wound on the stump is centered over the second metatarsal stump. A fluid  collection surrounding the second metatarsal remnant up to measures approximately 1.6 cm long by 1.4 cm transverse by 1.8 cm craniocaudal. The collection is in the shape of the letter U. Diffuse subcutaneous edema is present about the foot. IMPRESSION: Large skin wound centered over the remnant of the patient's second metatarsal. Marrow edema is seen in approximately the distal 2.4 cm of the metatarsal remnant worrisome for osteomyelitis. Edema is most intense in the plantar aspect of the distal 0.7 cm of the metatarsal. Fluid collection the shape of the better U surrounding the distal second metatarsal is worrisome for abscess. Electronically Signed   By: Drusilla Kannerhomas  Dalessio [name]   On: 05/14/2017 10:46   Dg Foot Complete Right  Result Date: 05/13/2017 CLINICAL DATA:  Foul-smelling discharge from top of foot for 1 week, history diabetes mellitus EXAM: RIGHT FOOT COMPLETE - 3+ VIEW COMPARISON:  10/27/2016 FINDINGS: Prior transmetatarsal amputation of second through fifth rays with amputation of the entirety of the first metatarsal. Distal soft tissue swelling with soft tissue irregularity  and gas at the distal stump. Bones demineralized. No acute fracture or dislocation. Question new cortical loss at the volar aspect of the stump of the second metatarsal concerning for acute osteomyelitis, new since prior exam. IMPRESSION: Distal soft tissue swelling and gas with suspected new bone destruction at the distal aspect of the second metatarsal stump, concerning for acute osteomyelitis. Electronically Signed   By: Ulyses Southward [name]   On: 05/13/2017 18:19   - pertinent xrays, CT, MRI studies were reviewed and independently interpreted  Positive ROS: All other systems have been reviewed and were otherwise negative with the exception of those mentioned in the HPI and as above.  Physical Exam: General: Alert, no acute distress Psychiatric: Patient is competent for consent with normal mood and affect Lymphatic: No  axillary or cervical lymphadenopathy Cardiovascular: No pedal edema Respiratory: No cyanosis, no use of accessory musculature GI: No organomegaly, abdomen is soft and non-tender  Skin: Examination patient has an ulcer over the transmetatarsal amputation it probes down to the second metatarsal.  There is purulent drainage there is no ascending cellulitis.   Neurologic: Patient does not have protective sensation bilateral lower extremities.   MUSCULOSKELETAL:  Patient has a good dorsalis pedis pulse he has a Waggoner grade 3 ulcer which probes to bone.  Review of the MRI scan shows an abscess and osteomyelitis of the second metatarsal.  Assessment: Assessment: Diabetic insensate neuropathy with abscess and osteomyelitis of the second metatarsal status post transmetatarsal amputation.  Plan: Plan: We will plan for revision of the transmetatarsal amputation.  Discussed the importance of nonweightbearing for at least 3 weeks.  Risk of the wound not healing need for higher level amputation was discussed.  Patient states he understands was to proceed at this time.  We will plan for revision of the transmetatarsal amputation tomorrow Friday.  Thank you for the consult and the opportunity to see Mr. Mammie Russian, MD [facility] [phone] 3:52 PM

## 2017-05-14 NOTE — Progress Notes (Signed)
[facility]                                                                              Patient Demographics  [name], is a 41 y.o. male, DOB - 06/05/1976, GEX:[id number]  Admit date - 05/13/2017   Admitting Physician Haydee Monicaachal A David, MD  Outpatient Primary MD for the patient is Koirala, Dibas, MD  Outpatient specialists:   LOS - 1  days   Medical records reviewed and are as summarized below:    Chief Complaint  Patient presents with  . Foot Pain       Brief summary   Per admit note by Dr. Onalee Huaavid on 12/5 Curtis FettersLamont Bauza is a 41 y.o. male with medical history significant of polysubstance abuse, DM, amputee of right forefoot by dr duda over a year ago comes in with open foul smelling wound to right forefoot that has been worsening for a week, with foul odor draining with no redness.  Pt says his shoe caused a wound and it started with a blister then popped and is now bad.  He denies any recent abx.  No n/v/d.  Pt referred for admission for concerns of new osteomyelitis of the foot.  Sugar is over 500.   Assessment & Plan    Principal Problem:   Diabetic foot ulcer with osteomyelitis (HCC) in the setting of uncontrolled diabetes -X-ray of the right foot consistent with acute osteomyelitis, distal soft tissue swelling and gas with suspected new bone destruction of the distal aspect of second metatarsal stump -Follow MRI of the right foot - Dr Lajoyce Cornersuda consulted, patient currently afebrile, hemodynamically stable, hold off on antibiotics until intraoperative cultures - diabetic management stressed to the patient  Active Problems:   Diabetes mellitus (HCC), uncontrolled -Hemoglobin A1c 14.2, on admission glucose 526, does not check blood sugars at home, states does not have glucometer -Diabetic coordinator consult, nutrition consult, -Placed on Lantus 20 units at bedtime, NovoLog meal coverage 4 units 3 times a day with meals, sliding scale insulin    CKD  (chronic kidney disease), stage III (HCC) -Baseline creatinine around 2 -Currently 1.6, continue to monitor closely    Benign essential HTN -Stable, continue lisinopril  Hyperlipidemia -Continue statin    Gastroparesis -Stable    Polysubstance abuse (HCC) -UDS positive for opiates and cocaine, counseled strongly on cessation  Code Status: Full CODE STATUS DVT Prophylaxis:  SCD's Family Communication: Discussed in detail with the patient, all imaging results, lab results explained to the patient   Disposition Plan:   Time Spent in minutes   35 minutes  Procedures:  None  Consultants:   Dr Lajoyce Cornersuda   Antimicrobials:      Medications  Scheduled Meds: . atorvastatin  10 mg Oral Daily  . insulin aspart  0-5 Units Subcutaneous QHS  . insulin aspart  0-9 Units Subcutaneous TID WC  . insulin glargine  15 Units Subcutaneous QHS  . lisinopril  10 mg Oral [id number]  . sodium chloride flush  3 mL Intravenous Q12H   Continuous Infusions: . sodium chloride     PRN Meds:.sodium chloride, gabapentin, ondansetron **OR** ondansetron (ZOFRAN) IV,  oxyCODONE-acetaminophen, sodium chloride flush   Antibiotics   Anti-infectives (From admission, onward)   None        Subjective:   [name] was seen and examined today.  Pain controlled in the right foot, foul-smelling odor.  Afebrile this morning.  Patient denies dizziness, chest pain, shortness of breath, abdominal pain, N/V/D/C, new weakness, numbess, tingling. No acute events overnight.    Objective:   Vitals:   05/13/17 1848 05/13/17 1930 05/13/17 2039 05/14/17 0607  BP:  (!) 140/97 (!) 149/99 (!) 152/98  Pulse:  79 76 68  Resp:   16 16  Temp:   97.7 F (36.5 C) (!) 97.4 F (36.3 C)  TempSrc:   Oral Oral  SpO2:  100% 100% 100%  Weight: 120.2 kg (265 lb)     Height: 6\' 1"  (1.854 m)       Intake/Output Summary (Last 24 hours) at 05/14/2017 1046 Last data filed at 05/13/2017 1951 Gross per 24 hour  Intake 1000  ml  Output -  Net 1000 ml     Wt Readings from Last 3 Encounters:  05/13/17 120.2 kg (265 lb)  08/07/16 106.6 kg (235 lb)  07/10/16 106.6 kg (235 lb)     Exam  General: Alert and oriented x 3, NAD  Eyes:   HEENT:  Atraumatic, normocephalic  Cardiovascular: S1 S2 auscultated, no rubs, murmurs or gallops. Regular rate and rhythm.  Respiratory: Clear to auscultation bilaterally, no wheezing, rales or rhonchi  Gastrointestinal: Soft, nontender, nondistended, + bowel sounds  Ext: no pedal edema bilaterally  Neuro: AAOx3, Cr N's II- XII. Strength 5/5 upper and lower extremities bilaterally, speech clear  Musculoskeletal: No digital cyanosis, clubbing  Skin: Right forefoot with open wound, purulent drainage and foul odor, no surrounding erythema  Psych: Normal affect and demeanor, alert and oriented x3    Data Reviewed:  I have personally reviewed following labs and imaging studies  Micro Results No results found for this or any previous visit (from the past 240 hour(s)).  Radiology Reports Dg Foot Complete Right  Result Date: 05/13/2017 CLINICAL DATA:  Foul-smelling discharge from top of foot for 1 week, history diabetes mellitus EXAM: RIGHT FOOT COMPLETE - 3+ VIEW COMPARISON:  10/27/2016 FINDINGS: Prior transmetatarsal amputation of second through fifth rays with amputation of the entirety of the first metatarsal. Distal soft tissue swelling with soft tissue irregularity and gas at the distal stump. Bones demineralized. No acute fracture or dislocation. Question new cortical loss at the volar aspect of the stump of the second metatarsal concerning for acute osteomyelitis, new since prior exam. IMPRESSION: Distal soft tissue swelling and gas with suspected new bone destruction at the distal aspect of the second metatarsal stump, concerning for acute osteomyelitis. Electronically Signed   By: Ulyses SouthwardMark  Boles [name]   On: 05/13/2017 18:19    Lab Data:  CBC: Recent Labs  Lab  05/13/17 1301 05/14/17 0526  WBC 6.8 7.3  NEUTROABS 4.4  --   HGB 12.0* 11.3*  HCT 35.8* 34.6*  MCV 89.9 89.9  PLT 237 222   Basic Metabolic Panel: Recent Labs  Lab 05/13/17 1301 05/14/17 0526  NA 131* 133*  K 4.6 4.1  CL 96* 101  CO2 26 23  GLUCOSE 526* 318*  BUN 21* 19  CREATININE 1.95* 1.60*  CALCIUM 9.1 8.7*   GFR: Estimated Creatinine Clearance: 83.3 mL/min (A) (by C-G formula based on SCr of 1.6 mg/dL (H)). Liver Function Tests: Recent Labs  Lab 05/13/17 1301  AST 22  ALT 23  ALKPHOS 97  BILITOT 0.4  PROT 7.9  ALBUMIN 3.1*   No results for input(s): LIPASE, AMYLASE in the last 168 hours. No results for input(s): AMMONIA in the last 168 hours. Coagulation Profile: No results for input(s): INR, PROTIME in the last 168 hours. Cardiac Enzymes: No results for input(s): CKTOTAL, CKMB, CKMBINDEX, TROPONINI in the last 168 hours. BNP (last 3 results) No results for input(s): PROBNP in the last 8760 hours. HbA1C: Recent Labs    05/13/17 2029  HGBA1C 14.2*   CBG: Recent Labs  Lab 05/13/17 2035 05/14/17 0551  GLUCAP 391* 313*   Lipid Profile: No results for input(s): CHOL, HDL, LDLCALC, TRIG, CHOLHDL, LDLDIRECT in the last 72 hours. Thyroid Function Tests: No results for input(s): TSH, T4TOTAL, FREET4, T3FREE, THYROIDAB in the last 72 hours. Anemia Panel: No results for input(s): VITAMINB12, FOLATE, FERRITIN, TIBC, IRON, RETICCTPCT in the last 72 hours. Urine analysis:    Component Value Date/Time   COLORURINE YELLOW 05/13/2016 0925   APPEARANCEUR CLEAR 05/13/2016 0925   LABSPEC >=1.030 08/22/2016 1941   PHURINE 6.0 08/22/2016 1941   GLUCOSEU NEGATIVE 08/22/2016 1941   HGBUR TRACE (A) 08/22/2016 1941   BILIRUBINUR NEGATIVE 08/22/2016 1941   KETONESUR NEGATIVE 08/22/2016 1941   PROTEINUR >=300 (A) 08/22/2016 1941   UROBILINOGEN 1.0 08/22/2016 1941   NITRITE NEGATIVE 08/22/2016 1941   LEUKOCYTESUR NEGATIVE 08/22/2016 1941     Ethelda Deangelo  [name] [facility] 05/14/2017, 10:46 AM  Pager: [phone] Between 7am to 7pm - call Pager - [phone]  After 7pm go to [email or web address] - password [email or web address]  Call night coverage person covering after 7pm

## 2017-05-14 NOTE — Progress Notes (Signed)
Spoke with patient about his diabetes. Was diagnosed about 22 years ago. Has been on and off insulin over the years. Does have a PCP that he saw just about 2 months ago. Currently, taking Lantus 30 units daily and Novolog 15 units daily at home. States that he checks blood sugars once a day. States the he knows that his A1C is high. Will continue to monitor blood sugars while in the hospital.   Smith MinceKendra Samyia Motter RN BSN CDE Diabetes Coordinator Pager: [phone]  8am-5pm

## 2017-05-14 NOTE — Progress Notes (Signed)
ABI's have been completed.  05/14/17 8:30 AM Olen CordialGreg Abeera Flannery RVT

## 2017-05-14 NOTE — Progress Notes (Signed)
Initial Nutrition Assessment  DOCUMENTATION CODES:   Obesity unspecified  INTERVENTION:    Prostat liquid protein po 30 ml TID with meals, each supplement provides 100 kcal, 15 grams protein  Follow up for DM diet education  NUTRITION DIAGNOSIS:   Increased nutrient needs related to wound healing as evidenced by estimated needs  GOAL:   Patient will meet greater than or equal to 90% of their needs  MONITOR:   PO intake, Supplement acceptance, Labs, Weight trends, Skin, I & O's  REASON FOR ASSESSMENT:   Consult Wound healing  ASSESSMENT:   41 y.o. male who presents with diabetic insensate neuropathy with peripheral vascular disease with a purulent drainage ulcer from the right transmetatarsal amputation.  Plan is for revision of transmetatarsal amputation Friday,12/7. PO intake good at 75% per flowsheet records. Pt would benefit from liquid protein supplement. Labs and medications reviewed. Na 133 (L). CBG's [phone].  NUTRITION - FOCUSED PHYSICAL EXAM:    Most Recent Value  Orbital Region  No depletion  Upper Arm Region  No depletion  Thoracic and Lumbar Region  No depletion  Buccal Region  No depletion  Temple Region  No depletion  Clavicle Bone Region  No depletion  Clavicle and Acromion Bone Region  No depletion  Scapular Bone Region  No depletion  Dorsal Hand  No depletion  Patellar Region  No depletion  Anterior Thigh Region  No depletion  Posterior Calf Region  No depletion  Edema (RD Assessment)  None     Diet Order:  Diet heart healthy/carb modified Room service appropriate? Yes; Fluid consistency: Thin  EDUCATION NEEDS:   Not appropriate for education at this time  Skin:  Skin Assessment: Skin Integrity Issues: Skin Integrity Issues:: Other (Comment) Other: full thickness to R foot  Last BM:  N/A  Height:   Ht Readings from Last 1 Encounters:  05/13/17 6\' 1"  (1.854 m)   Weight:   Wt Readings from Last 1 Encounters:  05/13/17  265 lb (120.2 kg)   Ideal Body Weight:  83.6 kg  BMI:  Body mass index is 34.96 kg/m.  Estimated Nutritional Needs:   Kcal:  2300-2500  Protein:  120-135 gm  Fluid:  2.3-2.5 L  Maureen ChattersKatie Clement Deneault, RD, LDN Pager #: [phone] After-Hours Pager #: [phone]

## 2017-05-14 NOTE — H&P (View-Only) (Signed)
ORTHOPAEDIC CONSULTATION  REQUESTING PHYSICIAN: Cathren Harshai, Ripudeep K, MD  Chief Complaint: Ulceration abscess right transmetatarsal amputation.  HPI: [name] is a 41 y.o. male who presents with diabetic insensate neuropathy with peripheral vascular disease with a purulent drainage ulcer from the right transmetatarsal amputation.  Past Medical History:  Diagnosis Date  . Acute osteomyelitis, ankle and foot 07/30/2010   Qualifier: Diagnosis of  By: Daiva EvesVan Dam MD, Remi Haggardornelius    . Anemia   . Chronic kidney disease (CKD), stage III (moderate) (HCC)   . Collagen vascular disease (HCC)   . DDD (degenerative disc disease), lumbar   . Dehiscence of amputation stump (HCC)     dehiscence right transmetetarsal amputation achilles contracture  . Gastroparesis   . GERD (gastroesophageal reflux disease)   . Headache   . Hiatal hernia   . Hyperlipidemia   . Hypertension   . Pancreatitis   . Peripheral vascular disease (HCC)   . Polysubstance abuse (HCC) 03/08/2016  . Renal insufficiency   . Type II diabetes mellitus (HCC) dx'd ~ 1996  . Vascular disease    poor circulation to left foot   Past Surgical History:  Procedure Laterality Date  . AMPUTATION  04/25/2011   Procedure: AMPUTATION DIGIT;  Surgeon: Nadara MustardMarcus V Nicey Krah, MD;  [facility];  Service: Orthopedics;  Laterality: Left;  Left foot 3rd toe amputation MTP joint, Gastroc Recession  Achilles Lengthening   . AMPUTATION Bilateral 03/25/2013   Procedure: AMPUTATION RAY;  Surgeon: Nadara MustardMarcus V Zarina Pe, MD;  Location: [facility] OR;  Service: Orthopedics;  Laterality: Bilateral;  Left Great Toe Amputation at  MTP Joint, Right 1st and 2nd Ray Amputation   . AMPUTATION Right 10/03/2014   Procedure: AMPUTATION MIDFOOT;  Surgeon: Nadara MustardMarcus Ynez Eugenio V, MD;  [facility];  Service: Orthopedics;  Laterality: Right;  . LAPAROSCOPIC CHOLECYSTECTOMY    . STUMP REVISION Right 05/23/2016   Procedure: Revision Right Transmetatarsal Amputation, Right Gastrocnemius  Recession;  Surgeon: Nadara MustardMarcus V Myrella Fahs, MD;  Location: [facility] OR;  Service: Orthopedics;  Laterality: Right;  . TOE AMPUTATION  2012   left foot; great toe and second toe   Social History   Socioeconomic History  . Marital status: Divorced    Spouse name: None  . Number of children: None  . Years of education: None  . Highest education level: None  Social Needs  . Financial resource strain: None  . Food insecurity - worry: None  . Food insecurity - inability: None  . Transportation needs - medical: None  . Transportation needs - non-medical: None  Occupational History  . None  Tobacco Use  . Smoking status: Current Some Day Smoker    Packs/day: 0.10    Years: 4.00    Pack years: 0.40    Types: Cigarettes  . Smokeless tobacco: Never Used  Substance and Sexual Activity  . Alcohol use: No  . Drug use: Yes    Frequency: 10.0 times per week    Types: Marijuana  . Sexual activity: Yes    Birth control/protection: None  Other Topics Concern  . None  Social History Narrative  . None   Family History  Problem Relation Age of Onset  . Heart attack Father 7552  . Hypertension Sister    - negative except otherwise stated in the family history section Allergies  Allergen Reactions  . No Known Allergies    Prior to Admission medications   Medication Sig Start Date End Date Taking? Authorizing Provider  atorvastatin (LIPITOR) 10  MG tablet Take 10 mg by mouth daily. 03/29/17  Yes [provider]  gabapentin (NEURONTIN) 100 MG capsule Take 2 capsules (200 mg total) by mouth 3 (three) times daily. Patient taking differently: Take 200 mg by mouth 3 (three) times daily as needed (nerve pain).  08/22/16  Yes Deatra Canterxford, William J, FNP  insulin aspart (NOVOLOG) 100 UNIT/ML injection Inject 3 Units into the skin daily. 08/22/16  Yes Deatra Canterxford, William J, FNP  insulin glargine (LANTUS) 100 UNIT/ML injection Inject 0.15 mLs (15 Units total) into the skin at bedtime. 08/22/16  Yes Deatra Canterxford, William  J, FNP  lisinopril (PRINIVIL,ZESTRIL) 10 MG tablet Take 1 tablet (10 mg total) by mouth every morning. 08/22/16  Yes Deatra Canterxford, William J, FNP  meloxicam (MOBIC) 7.5 MG tablet Take 1 tablet (7.5 mg total) by mouth daily. Patient taking differently: Take 7.5 mg by mouth daily as needed for pain.  10/01/16  Yes Mabe, Onalee Huaavid, NP  oxyCODONE-acetaminophen (PERCOCET/ROXICET) 5-325 MG tablet Take 1 tablet by mouth every 6 (six) hours as needed for severe pain. 07/10/16  Yes Nadara Mustarduda, Calin Ellery V, MD  traMADol (ULTRAM) 50 MG tablet Take 1 tablet (50 mg total) by mouth every 6 (six) hours as needed. For pain. Use sparingly. 10/01/16  Yes Mabe, Onalee Huaavid, NP  mupirocin ointment (BACTROBAN) 2 % Apply 1 application topically 2 (two) times daily. Patient not taking: Reported on 05/13/2017 09/04/16   Adonis HugueninZamora, Erin R, NP   Mri Right Foot Without Contrast  Result Date: 05/14/2017 CLINICAL DATA:  Soft tissue wound at the stump of the patient's right foot with discharge. Diabetic patient. EXAM: MRI OF THE RIGHT FOREFOOT WITHOUT CONTRAST TECHNIQUE: Multiplanar, multisequence MR imaging of the right forefoot was performed. No intravenous contrast was administered. COMPARISON:  Plain films right foot 05/13/2017 and 10/27/2016. MRI right foot 02/27/2016. FINDINGS: Bones/Joint/Cartilage As seen on the most recent plain films, the patient is status post amputation of the first ray at the level of the tarsometatarsal joint. Second through fifth transmetatarsal amputations are also identified. Mild marrow edema is seen in the distal 2.4 cm of the second metatarsal remnant. Edema is most intense in the distal 0.7 cm of the metatarsal in its plantar aspect. Soft tissue edema surrounds the metatarsal remnant. Ligaments Intact. Muscles and Tendons No acute abnormality. There is some atrophy of intrinsic musculature the foot. No intramuscular fluid collection. Soft tissues Large skin wound on the stump is centered over the second metatarsal stump. A fluid  collection surrounding the second metatarsal remnant up to measures approximately 1.6 cm long by 1.4 cm transverse by 1.8 cm craniocaudal. The collection is in the shape of the letter U. Diffuse subcutaneous edema is present about the foot. IMPRESSION: Large skin wound centered over the remnant of the patient's second metatarsal. Marrow edema is seen in approximately the distal 2.4 cm of the metatarsal remnant worrisome for osteomyelitis. Edema is most intense in the plantar aspect of the distal 0.7 cm of the metatarsal. Fluid collection the shape of the better U surrounding the distal second metatarsal is worrisome for abscess. Electronically Signed   By: Drusilla Kannerhomas  Dalessio [name]   On: 05/14/2017 10:46   Dg Foot Complete Right  Result Date: 05/13/2017 CLINICAL DATA:  Foul-smelling discharge from top of foot for 1 week, history diabetes mellitus EXAM: RIGHT FOOT COMPLETE - 3+ VIEW COMPARISON:  10/27/2016 FINDINGS: Prior transmetatarsal amputation of second through fifth rays with amputation of the entirety of the first metatarsal. Distal soft tissue swelling with soft tissue irregularity  and gas at the distal stump. Bones demineralized. No acute fracture or dislocation. Question new cortical loss at the volar aspect of the stump of the second metatarsal concerning for acute osteomyelitis, new since prior exam. IMPRESSION: Distal soft tissue swelling and gas with suspected new bone destruction at the distal aspect of the second metatarsal stump, concerning for acute osteomyelitis. Electronically Signed   By: Ulyses Southward [name]   On: 05/13/2017 18:19   - pertinent xrays, CT, MRI studies were reviewed and independently interpreted  Positive ROS: All other systems have been reviewed and were otherwise negative with the exception of those mentioned in the HPI and as above.  Physical Exam: General: Alert, no acute distress Psychiatric: Patient is competent for consent with normal mood and affect Lymphatic: No  axillary or cervical lymphadenopathy Cardiovascular: No pedal edema Respiratory: No cyanosis, no use of accessory musculature GI: No organomegaly, abdomen is soft and non-tender  Skin: Examination patient has an ulcer over the transmetatarsal amputation it probes down to the second metatarsal.  There is purulent drainage there is no ascending cellulitis.   Neurologic: Patient does not have protective sensation bilateral lower extremities.   MUSCULOSKELETAL:  Patient has a good dorsalis pedis pulse he has a Waggoner grade 3 ulcer which probes to bone.  Review of the MRI scan shows an abscess and osteomyelitis of the second metatarsal.  Assessment: Assessment: Diabetic insensate neuropathy with abscess and osteomyelitis of the second metatarsal status post transmetatarsal amputation.  Plan: Plan: We will plan for revision of the transmetatarsal amputation.  Discussed the importance of nonweightbearing for at least 3 weeks.  Risk of the wound not healing need for higher level amputation was discussed.  Patient states he understands was to proceed at this time.  We will plan for revision of the transmetatarsal amputation tomorrow Friday.  Thank you for the consult and the opportunity to see Mr. Mammie Russian, MD [facility] [phone] 3:52 PM

## 2017-05-15 ENCOUNTER — Inpatient Hospital Stay (HOSPITAL_COMMUNITY): Payer: PPO | Admitting: Certified Registered"

## 2017-05-15 ENCOUNTER — Encounter (HOSPITAL_COMMUNITY)
Admission: EM | Disposition: A | Discharge status: Home / Self Care | Payer: Self-pay | Source: Home / Self Care | Attending: Internal Medicine

## 2017-05-15 DIAGNOSIS — E138 Other specified diabetes mellitus with unspecified complications: Secondary | ICD-10-CM

## 2017-05-15 DIAGNOSIS — N183 Chronic kidney disease, stage 3 (moderate): Secondary | ICD-10-CM

## 2017-05-15 DIAGNOSIS — Z794 Long term (current) use of insulin: Secondary | ICD-10-CM

## 2017-05-15 DIAGNOSIS — M8610 Other acute osteomyelitis, unspecified site: Secondary | ICD-10-CM

## 2017-05-15 DIAGNOSIS — I10 Essential (primary) hypertension: Secondary | ICD-10-CM

## 2017-05-15 HISTORY — PX: STUMP REVISION: SHX6102

## 2017-05-15 LAB — GLUCOSE, CAPILLARY
GLUCOSE-CAPILLARY: 236 mg/dL — AB (ref 65–99)
GLUCOSE-CAPILLARY: 346 mg/dL — AB (ref 65–99)
Glucose-Capillary: 324 mg/dL — ABNORMAL HIGH (ref 65–99)
Glucose-Capillary: 266 mg/dL — ABNORMAL HIGH (ref 65–99)
Glucose-Capillary: 234 mg/dL — ABNORMAL HIGH (ref 65–99)
Glucose-Capillary: 451 mg/dL — ABNORMAL HIGH (ref 65–99)

## 2017-05-15 LAB — CBC
HEMATOCRIT: 34.2 % — AB (ref 39.0–52.0)
Hemoglobin: 11.2 g/dL — ABNORMAL LOW (ref 13.0–17.0)
MCH: 29.4 pg (ref 26.0–34.0)
MCHC: 32.7 g/dL (ref 30.0–36.0)
MCV: 89.8 fL (ref 78.0–100.0)
PLATELETS: 235 10*3/uL (ref 150–400)
RBC: 3.81 MIL/uL — ABNORMAL LOW (ref 4.22–5.81)
RDW: 13.1 % (ref 11.5–15.5)
WBC: 7.4 10*3/uL (ref 4.0–10.5)

## 2017-05-15 LAB — BASIC METABOLIC PANEL
Anion gap: 6 (ref 5–15)
BUN: 21 mg/dL — AB (ref 6–20)
CO2: 27 mmol/L (ref 22–32)
Calcium: 8.8 mg/dL — ABNORMAL LOW (ref 8.9–10.3)
Chloride: 99 mmol/L — ABNORMAL LOW (ref 101–111)
Creatinine, Ser: 1.74 mg/dL — ABNORMAL HIGH (ref 0.61–1.24)
GFR calc Af Amer: 55 mL/min — ABNORMAL LOW (ref >60)
GFR, EST NON AFRICAN AMERICAN: 47 mL/min — AB (ref >60)
GLUCOSE: 336 mg/dL — AB (ref 65–99)
Potassium: 3.9 mmol/L (ref 3.5–5.1)
Sodium: 132 mmol/L — ABNORMAL LOW (ref 135–145)

## 2017-05-15 SURGERY — REVISION, AMPUTATION SITE
Anesthesia: General | Laterality: Right

## 2017-05-15 MED ORDER — PIPERACILLIN-TAZOBACTAM 3.375 G IVPB
3.3750 g | Freq: Three times a day (TID) | INTRAVENOUS | Status: DC
  Administered 2017-05-15 – 2017-05-16 (×3): 3.375 g via INTRAVENOUS
  Filled 2017-05-15 (×4): qty 50

## 2017-05-15 MED ORDER — METHOCARBAMOL 1000 MG/10ML IJ SOLN
500.0000 mg | Freq: Four times a day (QID) | INTRAVENOUS | Status: DC | PRN
  Filled 2017-05-15: qty 5

## 2017-05-15 MED ORDER — INSULIN ASPART 100 UNIT/ML ~~LOC~~ SOLN
5.0000 [IU] | Freq: Three times a day (TID) | SUBCUTANEOUS | Status: DC
  Administered 2017-05-15 – 2017-05-16 (×2): 5 [IU] via SUBCUTANEOUS

## 2017-05-15 MED ORDER — ACETAMINOPHEN 650 MG RE SUPP: 650.0000 mg | RECTAL | Status: DC | PRN

## 2017-05-15 MED ORDER — BISACODYL 10 MG RE SUPP: 10.0000 mg | Freq: Every day | RECTAL | Status: DC | PRN

## 2017-05-15 MED ORDER — ONDANSETRON HCL 4 MG/2ML IJ SOLN
INTRAMUSCULAR | Status: AC
  Filled 2017-05-15: qty 4

## 2017-05-15 MED ORDER — INSULIN ASPART 100 UNIT/ML ~~LOC~~ SOLN
SUBCUTANEOUS | Status: AC
  Administered 2017-05-15: 4 [IU] via SUBCUTANEOUS
  Filled 2017-05-15: qty 1

## 2017-05-15 MED ORDER — ONDANSETRON HCL 4 MG/2ML IJ SOLN: 4.0000 mg | Freq: Four times a day (QID) | INTRAMUSCULAR | Status: DC | PRN

## 2017-05-15 MED ORDER — HYDROMORPHONE HCL 1 MG/ML IJ SOLN: 0.2500 mg | INTRAMUSCULAR | Status: DC | PRN

## 2017-05-15 MED ORDER — PHENYLEPHRINE 40 MCG/ML (10ML) SYRINGE FOR IV PUSH (FOR BLOOD PRESSURE SUPPORT)
PREFILLED_SYRINGE | INTRAVENOUS | Status: AC
  Filled 2017-05-15: qty 10

## 2017-05-15 MED ORDER — FENTANYL CITRATE (PF) 250 MCG/5ML IJ SOLN
INTRAMUSCULAR | Status: AC
  Filled 2017-05-15: qty 5

## 2017-05-15 MED ORDER — FENTANYL CITRATE (PF) 250 MCG/5ML IJ SOLN
INTRAMUSCULAR | Status: DC | PRN
  Administered 2017-05-15: 50 ug via INTRAVENOUS

## 2017-05-15 MED ORDER — MIDAZOLAM HCL 5 MG/5ML IJ SOLN
INTRAMUSCULAR | Status: DC | PRN
  Administered 2017-05-15: 2 mg via INTRAVENOUS

## 2017-05-15 MED ORDER — LIDOCAINE 2% (20 MG/ML) 5 ML SYRINGE
INTRAMUSCULAR | Status: AC
  Filled 2017-05-15: qty 5

## 2017-05-15 MED ORDER — POLYETHYLENE GLYCOL 3350 17 G PO PACK: 17.0000 g | PACK | Freq: Every day | ORAL | Status: DC | PRN

## 2017-05-15 MED ORDER — METHOCARBAMOL 500 MG PO TABS
500.0000 mg | ORAL_TABLET | Freq: Four times a day (QID) | ORAL | Status: DC | PRN
  Administered 2017-05-15: 500 mg via ORAL
  Filled 2017-05-15: qty 1

## 2017-05-15 MED ORDER — MAGNESIUM CITRATE PO SOLN: 1.0000 | Freq: Once | ORAL | Status: DC | PRN

## 2017-05-15 MED ORDER — OXYCODONE HCL 5 MG PO TABS
5.0000 mg | ORAL_TABLET | ORAL | Status: DC | PRN
  Administered 2017-05-15: 5 mg via ORAL
  Filled 2017-05-15: qty 1

## 2017-05-15 MED ORDER — 0.9 % SODIUM CHLORIDE (POUR BTL) OPTIME
TOPICAL | Status: DC | PRN
  Administered 2017-05-15: 1000 mL

## 2017-05-15 MED ORDER — PHENYLEPHRINE 40 MCG/ML (10ML) SYRINGE FOR IV PUSH (FOR BLOOD PRESSURE SUPPORT)
PREFILLED_SYRINGE | INTRAVENOUS | Status: DC | PRN
  Administered 2017-05-15: 80 ug via INTRAVENOUS
  Administered 2017-05-15: 120 ug via INTRAVENOUS

## 2017-05-15 MED ORDER — SODIUM CHLORIDE 0.9 % IV SOLN
1250.0000 mg | INTRAVENOUS | Status: DC
  Filled 2017-05-15: qty 1250

## 2017-05-15 MED ORDER — MIDAZOLAM HCL 2 MG/2ML IJ SOLN
INTRAMUSCULAR | Status: AC
  Filled 2017-05-15: qty 2

## 2017-05-15 MED ORDER — DEXAMETHASONE SODIUM PHOSPHATE 10 MG/ML IJ SOLN
INTRAMUSCULAR | Status: DC | PRN
  Administered 2017-05-15: 5 mg via INTRAVENOUS

## 2017-05-15 MED ORDER — LIDOCAINE 2% (20 MG/ML) 5 ML SYRINGE
INTRAMUSCULAR | Status: DC | PRN
  Administered 2017-05-15: 100 mg via INTRAVENOUS

## 2017-05-15 MED ORDER — SODIUM CHLORIDE 0.9 % IV SOLN
INTRAVENOUS | Status: DC
  Administered 2017-05-15: 14:00:00 via INTRAVENOUS

## 2017-05-15 MED ORDER — METOCLOPRAMIDE HCL 5 MG PO TABS
5.0000 mg | ORAL_TABLET | Freq: Three times a day (TID) | ORAL | Status: DC | PRN
Start: 2017-05-15 — End: 2017-05-16

## 2017-05-15 MED ORDER — PROPOFOL 10 MG/ML IV BOLUS
INTRAVENOUS | Status: DC | PRN
  Administered 2017-05-15: 200 mg via INTRAVENOUS

## 2017-05-15 MED ORDER — METOCLOPRAMIDE HCL 5 MG/ML IJ SOLN: 5.0000 mg | Freq: Three times a day (TID) | INTRAMUSCULAR | Status: DC | PRN

## 2017-05-15 MED ORDER — VANCOMYCIN HCL 10 G IV SOLR
2000.0000 mg | Freq: Once | INTRAVENOUS | Status: AC
  Administered 2017-05-15: 2000 mg via INTRAVENOUS
  Filled 2017-05-15: qty 2000

## 2017-05-15 MED ORDER — DOCUSATE SODIUM 100 MG PO CAPS
100.0000 mg | ORAL_CAPSULE | Freq: Two times a day (BID) | ORAL | Status: DC
  Administered 2017-05-15 – 2017-05-16 (×2): 100 mg via ORAL
  Filled 2017-05-15 (×2): qty 1

## 2017-05-15 MED ORDER — HYDROMORPHONE HCL 1 MG/ML IJ SOLN
1.0000 mg | INTRAMUSCULAR | Status: DC | PRN
  Administered 2017-05-15 (×2): 1 mg via INTRAVENOUS
  Filled 2017-05-15 (×2): qty 1

## 2017-05-15 MED ORDER — ONDANSETRON HCL 4 MG/2ML IJ SOLN
INTRAMUSCULAR | Status: AC
Start: 2017-05-15 — End: 2017-05-15
  Filled 2017-05-15: qty 2

## 2017-05-15 MED ORDER — OXYCODONE HCL 5 MG PO TABS
10.0000 mg | ORAL_TABLET | ORAL | Status: DC | PRN
Start: 2017-05-15 — End: 2017-05-16
  Administered 2017-05-15 – 2017-05-16 (×3): 10 mg via ORAL
  Filled 2017-05-15 (×3): qty 2

## 2017-05-15 MED ORDER — ONDANSETRON HCL 4 MG/2ML IJ SOLN
INTRAMUSCULAR | Status: DC | PRN
Start: 2017-05-15 — End: 2017-05-15
  Administered 2017-05-15: 4 mg via INTRAVENOUS

## 2017-05-15 MED ORDER — SODIUM CHLORIDE 0.9 % IV SOLN
INTRAVENOUS | Status: DC
  Administered 2017-05-15: 11:00:00 via INTRAVENOUS

## 2017-05-15 MED ORDER — ACETAMINOPHEN 325 MG PO TABS: 650.0000 mg | ORAL_TABLET | ORAL | Status: DC | PRN

## 2017-05-15 MED ORDER — DEXAMETHASONE SODIUM PHOSPHATE 10 MG/ML IJ SOLN
INTRAMUSCULAR | Status: AC
  Filled 2017-05-15: qty 1

## 2017-05-15 MED ORDER — PROPOFOL 10 MG/ML IV BOLUS
INTRAVENOUS | Status: AC
  Filled 2017-05-15: qty 20

## 2017-05-15 MED ORDER — INSULIN GLARGINE 100 UNIT/ML ~~LOC~~ SOLN
24.0000 [IU] | Freq: Every day | SUBCUTANEOUS | Status: DC
  Administered 2017-05-15: 24 [IU] via SUBCUTANEOUS
  Filled 2017-05-15: qty 0.24

## 2017-05-15 MED ORDER — PROMETHAZINE HCL 25 MG/ML IJ SOLN: 6.2500 mg | INTRAMUSCULAR | Status: DC | PRN

## 2017-05-15 MED ORDER — EPHEDRINE SULFATE-NACL 50-0.9 MG/10ML-% IV SOSY
PREFILLED_SYRINGE | INTRAVENOUS | Status: DC | PRN
  Administered 2017-05-15: 10 mg via INTRAVENOUS

## 2017-05-15 MED ORDER — INSULIN ASPART 100 UNIT/ML ~~LOC~~ SOLN
4.0000 [IU] | Freq: Once | SUBCUTANEOUS | Status: AC
  Administered 2017-05-15: 4 [IU] via SUBCUTANEOUS

## 2017-05-15 MED ORDER — ONDANSETRON HCL 4 MG PO TABS: 4.0000 mg | ORAL_TABLET | Freq: Four times a day (QID) | ORAL | Status: DC | PRN

## 2017-05-15 SURGICAL SUPPLY — 29 items
BLADE SAW RECIP 87.9 MT (BLADE) ×2 IMPLANT
BLADE SURG 21 STRL SS (BLADE) ×3 IMPLANT
COVER SURGICAL LIGHT HANDLE (MISCELLANEOUS) ×3 IMPLANT
DRAPE EXTREMITY T 121X128X90 (DRAPE) ×3 IMPLANT
DRAPE HALF SHEET 40X57 (DRAPES) ×3 IMPLANT
DRAPE INCISE IOBAN 66X45 STRL (DRAPES) ×3 IMPLANT
DRAPE U-SHAPE 47X51 STRL (DRAPES) ×6 IMPLANT
DRESSING PREVENA PLUS CUSTOM (GAUZE/BANDAGES/DRESSINGS) IMPLANT
DRSG PREVENA PLUS CUSTOM (GAUZE/BANDAGES/DRESSINGS) ×3
DRSG VAC ATS MED SENSATRAC (GAUZE/BANDAGES/DRESSINGS) ×3 IMPLANT
DURAPREP 26ML APPLICATOR (WOUND CARE) ×3 IMPLANT
ELECT REM PT RETURN 9FT ADLT (ELECTROSURGICAL) ×3
ELECTRODE REM PT RTRN 9FT ADLT (ELECTROSURGICAL) ×1 IMPLANT
GLOVE BIOGEL PI IND STRL 9 (GLOVE) ×1 IMPLANT
GLOVE BIOGEL PI INDICATOR 9 (GLOVE) ×2
GLOVE SURG ORTHO 9.0 STRL STRW (GLOVE) ×3 IMPLANT
GOWN STRL REUS W/ TWL XL LVL3 (GOWN DISPOSABLE) ×2 IMPLANT
GOWN STRL REUS W/TWL XL LVL3 (GOWN DISPOSABLE) ×6
KIT BASIN OR (CUSTOM PROCEDURE TRAY) ×3 IMPLANT
KIT ROOM TURNOVER OR (KITS) ×3 IMPLANT
MANIFOLD NEPTUNE II (INSTRUMENTS) IMPLANT
NS IRRIG 1000ML POUR BTL (IV SOLUTION) ×3 IMPLANT
PACK GENERAL/GYN (CUSTOM PROCEDURE TRAY) ×3 IMPLANT
PAD ARMBOARD 7.5X6 YLW CONV (MISCELLANEOUS) ×3 IMPLANT
PAD NEG PRESSURE SENSATRAC (MISCELLANEOUS) IMPLANT
STAPLER VISISTAT 35W (STAPLE) IMPLANT
SUT ETHILON 2 0 PSLX (SUTURE) ×6 IMPLANT
SUT SILK 2 0 (SUTURE)
SUT SILK 2-0 18XBRD TIE 12 (SUTURE) IMPLANT

## 2017-05-15 NOTE — Progress Notes (Signed)
Received diabetes coordinator consult. Spoke with patient yesterday (12/6) in regards to his diabetes and medications. Will order patient Living Well with Diabetes booklet from pharmacy. Has PCP who takes of his diabetes.   Smith MinceKendra Orit Sanville RN BSN CDE Diabetes Coordinator Pager: [phone]  8am-5pm

## 2017-05-15 NOTE — Interval H&P Note (Signed)
History and Physical Interval Note:  05/15/2017 7:27 AM  [name]  has presented today for surgery, with the diagnosis of Osteomyelitis Right Foot  The various methods of treatment have been discussed with the patient and family. After consideration of risks, benefits and other options for treatment, the patient has consented to  Procedure(s): REVISION RIGHT TRANSMETATARSAL AMPUTATION (Right) as a surgical intervention .  The patient's history has been reviewed, patient examined, no change in status, stable for surgery.  I have reviewed the patient's chart and labs.  Questions were answered to the patient's satisfaction.     Nadara MustardMarcus V Duda

## 2017-05-15 NOTE — Anesthesia Postprocedure Evaluation (Signed)
Anesthesia Post Note  Patient: [name]  Procedure(s) Performed: REVISION RIGHT TRANSMETATARSAL AMPUTATION (Right )     Patient location during evaluation: PACU Anesthesia Type: General Level of consciousness: awake and sedated Pain management: pain level controlled Vital Signs Assessment: post-procedure vital signs reviewed and stable Respiratory status: spontaneous breathing, nonlabored ventilation, respiratory function stable and patient connected to nasal cannula oxygen Cardiovascular status: blood pressure returned to baseline and stable Postop Assessment: no apparent nausea or vomiting Anesthetic complications: no    Last Vitals:  Vitals:   05/15/17 1049 05/15/17 1232  BP: 122/89   Pulse: 74 87  Resp: 16   Temp: 36.7 C (!) 36.1 C  SpO2: 99% 98%    Last Pain:  Vitals:   05/15/17 1232  TempSrc:   PainSc: Asleep                 Eliaz Fout,JAMES TERRILL

## 2017-05-15 NOTE — Anesthesia Preprocedure Evaluation (Addendum)
Anesthesia Evaluation  Patient identified by MRN, date of birth, ID band Patient awake    Reviewed: Allergy & Precautions, NPO status , Patient's Chart, lab work & pertinent test results  Airway Mallampati: I  TM Distance: >3 FB Neck ROM: Full    Dental no notable dental hx.    Pulmonary former smoker,    breath sounds clear to auscultation       Cardiovascular hypertension, + Peripheral Vascular Disease   Rhythm:Regular Rate:Normal     Neuro/Psych  Neuromuscular disease    GI/Hepatic hiatal hernia, GERD  ,  Endo/Other  diabetes  Renal/GU      Musculoskeletal  (+) Arthritis ,   Abdominal   Peds  Hematology  (+) anemia ,   Anesthesia Other Findings   Reproductive/Obstetrics                             Anesthesia Physical Anesthesia Plan  ASA: III  Anesthesia Plan: General   Post-op Pain Management:    Induction: Intravenous  PONV Risk Score and Plan: 3 and Treatment may vary due to age or medical condition, Ondansetron and Dexamethasone  Airway Management Planned: LMA  Additional Equipment:   Intra-op Plan:   Post-operative Plan: Extubation in OR  Informed Consent: I have reviewed the patients History and Physical, chart, labs and discussed the procedure including the risks, benefits and alternatives for the proposed anesthesia with the patient or authorized representative who has indicated his/her understanding and acceptance.   Dental advisory given  Plan Discussed with: CRNA  Anesthesia Plan Comments:        Anesthesia Quick Evaluation

## 2017-05-15 NOTE — Transfer of Care (Signed)
Immediate Anesthesia Transfer of Care Note  Patient: [name]  Procedure(s) Performed: REVISION RIGHT TRANSMETATARSAL AMPUTATION (Right )  Patient Location: PACU  Anesthesia Type:General  Level of Consciousness: awake and patient cooperative  Airway & Oxygen Therapy: Patient Spontanous Breathing  Post-op Assessment: Report given to RN and Post -op Vital signs reviewed and stable  Post vital signs: Reviewed and stable  Last Vitals:  Vitals:   05/15/17 0617 05/15/17 1049  BP: (!) 145/74 122/89  Pulse: 74 74  Resp: 16 16  Temp: 37.1 C 36.7 C  SpO2: 100% 99%    Last Pain:  Vitals:   05/15/17 1049  TempSrc: Oral  PainSc:          Complications: No apparent anesthesia complications

## 2017-05-15 NOTE — Evaluation (Signed)
Physical Therapy Evaluation Patient Details Name: [name] MRN: [id number] DOB: 1976/02/05 Today's Date: 05/15/2017   History of Present Illness  41yo male who presented with acute ulcer to R foot; now s/p revision of R transmetatarsal amputation. PMH prior transmetatarsal amputation, CKD, DDD, PVD, DM, history of multiple digit/ray amputations   Clinical Impression  Patient received supine in bed, pleasant and willing to participate in skilled PT services; he does report familiarity with maintaining NWB status in the past. Able to perform bed mobility with modified independence, requires supervision with functional transfers primarily to manage wound vac and IV lines, and is able to ambulate in his room with walker/able to maintain NWB status without cues, assist provided to manage IV and wound vac. Patient demonstrates no significant balance or mobility deficit at evaluation, and reports no concerns with mobility or in maintaining NWB status at home. Patient may benefit from continued PT evaluation while in hospital, but does not appear to be in need of skilled PT follow-up after discharge. Patient declines up to chair at this time. Patient left in bed with all questions/concerns addressed, all needs met.     Follow Up Recommendations No PT follow up    Equipment Recommendations  Rolling walker with 5" wheels    Recommendations for Other Services       Precautions / Restrictions Precautions Precautions: Fall Restrictions Weight Bearing Restrictions: Yes RLE Weight Bearing: Non weight bearing Other Position/Activity Restrictions: NWB R LE       Mobility  Bed Mobility Overal bed mobility: Modified Independent             General bed mobility comments: cues to manage wound vac line   Transfers Overall transfer level: Needs assistance Equipment used: Rolling walker (2 wheeled) Transfers: Sit to/from Stand Sit to Stand: Supervision         General transfer comment:  cues to maintain NWB status   Ambulation/Gait Ambulation/Gait assistance: Supervision Ambulation Distance (Feet): 30 Feet Assistive device: Rolling walker (2 wheeled) Gait Pattern/deviations: Step-to pattern;WFL(Within Functional Limits);Trunk flexed     General Gait Details: able to maintain NWB status without assist or cues from PT   Stairs            Wheelchair Mobility    Modified Rankin (Stroke Patients Only)       Balance Overall balance assessment: No apparent balance deficits (not formally assessed)                                           Pertinent Vitals/Pain Pain Assessment: 0-10 Pain Score: 4  Pain Location: R foot  Pain Descriptors / Indicators: Aching Pain Intervention(s): Limited activity within patient's tolerance;Monitored during session    Home Living Family/patient expects to be discharged to:: Private residence Living Arrangements: Spouse/significant other Available Help at Discharge: Family;Available PRN/intermittently Type of Home: House Home Access: Ramped entrance     Home Layout: One level Home Equipment: Cane - single point;Bedside commode;Shower seat      Prior Function Level of Independence: Independent with assistive device(s)         Comments: uses a cane at baseline      Hand Dominance   Dominant Hand: Left    Extremity/Trunk Assessment   Upper Extremity Assessment Upper Extremity Assessment: Overall WFL for tasks assessed    Lower Extremity Assessment Lower Extremity Assessment: Overall WFL for tasks assessed  Cervical / Trunk Assessment Cervical / Trunk Assessment: Normal  Communication   Communication: No difficulties  Cognition Arousal/Alertness: Awake/alert Behavior During Therapy: WFL for tasks assessed/performed Overall Cognitive Status: Within Functional Limits for tasks assessed                                        General Comments General comments (skin  integrity, edema, etc.): patient able to maintain NWB status independently with walker during functional mobility; assistance mostly required to manage IV and wound vac     Exercises     Assessment/Plan    PT Assessment Patient needs continued PT services  PT Problem List Decreased mobility;Decreased knowledge of precautions       PT Treatment Interventions DME instruction;Therapeutic activities;Gait training;Therapeutic exercise;Patient/family education;Balance training;Functional mobility training;Neuromuscular re-education    PT Goals (Current goals can be found in the Care Plan section)  Acute Rehab PT Goals Patient Stated Goal: to go home  PT Goal Formulation: With patient Time For Goal Achievement: 05/29/17 Potential to Achieve Goals: Good    Frequency Min 3X/week   Barriers to discharge        Co-evaluation               AM-PAC PT "6 Clicks" Daily Activity  Outcome Measure Difficulty turning over in bed (including adjusting bedclothes, sheets and blankets)?: None Difficulty moving from lying on back to sitting on the side of the bed? : None Difficulty sitting down on and standing up from a chair with arms (e.g., wheelchair, bedside commode, etc,.)?: None Help needed moving to and from a bed to chair (including a wheelchair)?: A Little Help needed walking in hospital room?: A Little Help needed climbing 3-5 steps with a railing? : A Little 6 Click Score: 21    End of Session Equipment Utilized During Treatment: Gait belt Activity Tolerance: Patient tolerated treatment well Patient left: in bed;with call bell/phone within reach   PT Visit Diagnosis: Difficulty in walking, not elsewhere classified (R26.2)    Time: 1550-1605 PT Time Calculation (min) (ACUTE ONLY): 15 min   Charges:   PT Evaluation $PT Eval Low Complexity: 1 Low     PT G Codes:   PT G-Codes **NOT FOR INPATIENT CLASS** Functional Assessment Tool Used: AM-PAC 6 Clicks Basic  Mobility;Clinical judgement Functional Limitation: Mobility: Walking and moving around Mobility: Walking and Moving Around Current Status (R5188): At least 1 percent but less than 20 percent impaired, limited or restricted Mobility: Walking and Moving Around Goal Status [phone]): At least 1 percent but less than 20 percent impaired, limited or restricted    Deniece Ree PT, DPT, CBIS  Supplemental Physical Therapist [facility]

## 2017-05-15 NOTE — Progress Notes (Signed)
Short Stay states that pt has watch and bracelet with him. RN notified CNA to pick up items. CNA states she will.

## 2017-05-15 NOTE — Progress Notes (Signed)
[facility]                                                                              Patient Demographics  [name], is a 41 y.o. male, DOB - 01/10/1976, [name]  Admit date - 05/13/2017   Admitting Physician Curtis Monicaachal A David, MD  Outpatient Primary MD for the patient is Koirala, Dibas, MD  Outpatient specialists:   LOS - 2  days   Medical records reviewed and are as summarized below:    Chief Complaint  Patient presents with  . Foot Pain       Brief summary   Per admit note by Curtis. Onalee Clark on 12/5 [name] is a 41 y.o. male with medical history significant of polysubstance abuse, DM, amputee of right forefoot by [name] over a year ago comes in with open foul smelling wound to right forefoot that has been worsening for a week, with foul odor draining with no redness.  Pt says his shoe caused a wound and it started with a blister then popped and is now bad.  He denies any recent abx.  No n/v/d.  Pt referred for admission for concerns of new osteomyelitis of the foot.  Sugar is over 500.   Assessment & Plan    Principal Problem:   Diabetic right foot ulcer with osteomyelitis (HCC) in the setting of uncontrolled diabetes -X-ray of the right foot consistent with acute osteomyelitis, distal soft tissue swelling and gas with suspected new bone destruction of the distal aspect of second metatarsal stump -MRI of the foot showed large skin wound centered over the remnant of the patient's second metatarsal, marrow edema in approximately distal 2.4 cm of the metatarsal remnant worrisome for osteomyelitis and abscess - NPO, orthopedics consulted, plan for surgery today -Follow intraoperative cultures, start on IV vancomycin and Zosyn.  Active Problems:   Diabetes mellitus (HCC), uncontrolled -Hemoglobin A1c 14.2, on admission glucose 526, does not check blood sugars at home, states does not have glucometer -Diabetic coordinator consult, nutrition  consult, -CBGs uncontrolled, increase Lantus to 25 units att bedtime, NovoLog meal coverage 5 units 3 times a day with meals, sliding scale insulin    CKD (chronic kidney disease), stage III (HCC) -Baseline creatinine around 2 -Creatinine trending up to 1.7, follow closely    Benign essential HTN -Stable, continue lisinopril  Hyperlipidemia -Continue statin    Gastroparesis -Stable    Polysubstance abuse (HCC) -UDS positive for opiates and cocaine, counseled strongly on cessation  Code Status: Full CODE STATUS DVT Prophylaxis:  SCD's Family Communication: Discussed in detail with the patient, all imaging results, lab results explained to the patient   Disposition Plan:   Time Spent in minutes   25 minutes  Procedures:    Consultants:   Curtis [name]   Antimicrobials:      Medications  Scheduled Meds: . [MAR Hold] atorvastatin  10 mg Oral Daily  . [MAR Hold] feeding supplement (PRO-STAT SUGAR FREE 64)  30 mL Oral TID WC  . [MAR Hold] insulin aspart  0-5 Units Subcutaneous QHS  . [MAR Hold] insulin aspart  0-9 Units Subcutaneous TID WC  . [  MAR Hold] insulin aspart  4 Units Subcutaneous TID WC  . [MAR Hold] insulin glargine  20 Units Subcutaneous QHS  . [MAR Hold] lisinopril  10 mg Oral BH-q7a  . [MAR Hold] living well with diabetes book   Does not apply Once  . [MAR Hold] sodium chloride flush  3 mL Intravenous Q12H   Continuous Infusions: . [MAR Hold] sodium chloride    . sodium chloride 10 mL/hr at 05/15/17 1121   PRN Meds:.[MAR Hold] sodium chloride, [MAR Hold] gabapentin, [MAR Hold] ondansetron **OR** [MAR Hold] ondansetron (ZOFRAN) IV, [MAR Hold] oxyCODONE-acetaminophen, [MAR Hold] sodium chloride flush   Antibiotics   Anti-infectives (From admission, onward)   Start     Dose/Rate Route Frequency Ordered Stop   05/15/17 0600  ceFAZolin (ANCEF) IVPB 2g/100 mL premix  Status:  Discontinued     2 g 200 mL/hr over 30 Minutes Intravenous To [facility]  05/14/17 1920 05/15/17 1118        Subjective:   [name] was seen and examined today.  Pain controlled, hemodynamically stable, right foot foul-smelling odor from the wound.  Afebrile.  Patient denies dizziness, chest pain, shortness of breath, abdominal pain, N/V/D/C, new weakness, numbess, tingling. No acute events overnight.    Objective:   Vitals:   05/14/17 1522 05/14/17 2050 05/15/17 0617 05/15/17 1049  BP: 130/73 (!) 154/100 (!) 145/74 122/89  Pulse: 80 71 74 74  Resp: 18 16 16 16   Temp: 97.6 F (36.4 C) 98.2 F (36.8 C) 98.8 F (37.1 C) 98 F (36.7 C)  TempSrc: Oral Oral Oral Oral  SpO2: 100% 100% 100% 99%  Weight:      Height:        Intake/Output Summary (Last 24 hours) at 05/15/2017 1158 Last data filed at 05/14/2017 1834 Gross per 24 hour  Intake 540 ml  Output -  Net 540 ml     Wt Readings from Last 3 Encounters:  05/13/17 120.2 kg (265 lb)  08/07/16 106.6 kg (235 lb)  07/10/16 106.6 kg (235 lb)     Exam   General: Alert and oriented x 3, NAD  Eyes:   HEENT:    Cardiovascular: S1 S2 auscultated, no rubs, murmurs or gallops. Regular rate and rhythm. No pedal edema b/l  Respiratory: Clear to auscultation bilaterally, no wheezing, rales or rhonchi  Gastrointestinal: Soft, nontender, nondistended, + bowel sounds  Ext: no pedal edema bilaterally  Neuro no new deficits  Musculoskeletal: No digital cyanosis, clubbing  Skin: Right forefoot with open wound, foul-smelling odor  Psych: Normal affect and demeanor, alert and oriented x3    Data Reviewed:  I have personally reviewed following labs and imaging studies  Micro Results Recent Results (from the past 240 hour(s))  Surgical pcr screen     Status: None   Collection Time: 05/14/17  7:34 PM  Result Value Ref Range Status   MRSA, PCR NEGATIVE NEGATIVE Final   Staphylococcus aureus NEGATIVE NEGATIVE Final    Comment: (NOTE) The Xpert SA Assay (FDA approved for NASAL specimens in  patients 42 years of age and older), is one component of a comprehensive surveillance program. It is not intended to diagnose infection nor to guide or monitor treatment.     Radiology Reports Mri Right Foot Without Contrast  Result Date: 05/14/2017 CLINICAL DATA:  Soft tissue wound at the stump of the patient's right foot with discharge. Diabetic patient. EXAM: MRI OF THE RIGHT FOREFOOT WITHOUT CONTRAST TECHNIQUE: Multiplanar, multisequence MR imaging of the  right forefoot was performed. No intravenous contrast was administered. COMPARISON:  Plain films right foot 05/13/2017 and 10/27/2016. MRI right foot 02/27/2016. FINDINGS: Bones/Joint/Cartilage As seen on the most recent plain films, the patient is status post amputation of the first ray at the level of the tarsometatarsal joint. Second through fifth transmetatarsal amputations are also identified. Mild marrow edema is seen in the distal 2.4 cm of the second metatarsal remnant. Edema is most intense in the distal 0.7 cm of the metatarsal in its plantar aspect. Soft tissue edema surrounds the metatarsal remnant. Ligaments Intact. Muscles and Tendons No acute abnormality. There is some atrophy of intrinsic musculature the foot. No intramuscular fluid collection. Soft tissues Large skin wound on the stump is centered over the second metatarsal stump. A fluid collection surrounding the second metatarsal remnant up to measures approximately 1.6 cm long by 1.4 cm transverse by 1.8 cm craniocaudal. The collection is in the shape of the letter U. Diffuse subcutaneous edema is present about the foot. IMPRESSION: Large skin wound centered over the remnant of the patient's second metatarsal. Marrow edema is seen in approximately the distal 2.4 cm of the metatarsal remnant worrisome for osteomyelitis. Edema is most intense in the plantar aspect of the distal 0.7 cm of the metatarsal. Fluid collection the shape of the better U surrounding the distal second  metatarsal is worrisome for abscess. Electronically Signed   By: Drusilla Kannerhomas  Dalessio [name]   On: 05/14/2017 10:46   Dg Foot Complete Right  Result Date: 05/13/2017 CLINICAL DATA:  Foul-smelling discharge from top of foot for 1 week, history diabetes mellitus EXAM: RIGHT FOOT COMPLETE - 3+ VIEW COMPARISON:  10/27/2016 FINDINGS: Prior transmetatarsal amputation of second through fifth rays with amputation of the entirety of the first metatarsal. Distal soft tissue swelling with soft tissue irregularity and gas at the distal stump. Bones demineralized. No acute fracture or dislocation. Question new cortical loss at the volar aspect of the stump of the second metatarsal concerning for acute osteomyelitis, new since prior exam. IMPRESSION: Distal soft tissue swelling and gas with suspected new bone destruction at the distal aspect of the second metatarsal stump, concerning for acute osteomyelitis. Electronically Signed   By: Ulyses SouthwardMark  Boles [name]   On: 05/13/2017 18:19    Lab Data:  CBC: Recent Labs  Lab 05/13/17 1301 05/14/17 0526 05/15/17 0449  WBC 6.8 7.3 7.4  NEUTROABS 4.4  --   --   HGB 12.0* 11.3* 11.2*  HCT 35.8* 34.6* 34.2*  MCV 89.9 89.9 89.8  PLT 237 222 235   Basic Metabolic Panel: Recent Labs  Lab 05/13/17 1301 05/14/17 0526 05/15/17 0449  NA 131* 133* 132*  K 4.6 4.1 3.9  CL 96* 101 99*  CO2 26 23 27   GLUCOSE 526* 318* 336*  BUN 21* 19 21*  CREATININE 1.95* 1.60* 1.74*  CALCIUM 9.1 8.7* 8.8*   GFR: Estimated Creatinine Clearance: 76.6 mL/min (A) (by C-G formula based on SCr of 1.74 mg/dL (H)). Liver Function Tests: Recent Labs  Lab 05/13/17 1301  AST 22  ALT 23  ALKPHOS 97  BILITOT 0.4  PROT 7.9  ALBUMIN 3.1*   No results for input(s): LIPASE, AMYLASE in the last 168 hours. No results for input(s): AMMONIA in the last 168 hours. Coagulation Profile: No results for input(s): INR, PROTIME in the last 168 hours. Cardiac Enzymes: No results for input(s): CKTOTAL,  CKMB, CKMBINDEX, TROPONINI in the last 168 hours. BNP (last 3 results) No results for input(s): PROBNP in  the last 8760 hours. HbA1C: Recent Labs    05/13/17 2029  HGBA1C 14.2*   CBG: Recent Labs  Lab 05/14/17 1231 05/14/17 1649 05/14/17 2053 05/15/17 0601 05/15/17 1044  GLUCAP 254* 355* 325* 324* 266*   Lipid Profile: No results for input(s): CHOL, HDL, LDLCALC, TRIG, CHOLHDL, LDLDIRECT in the last 72 hours. Thyroid Function Tests: No results for input(s): TSH, T4TOTAL, FREET4, T3FREE, THYROIDAB in the last 72 hours. Anemia Panel: No results for input(s): VITAMINB12, FOLATE, FERRITIN, TIBC, IRON, RETICCTPCT in the last 72 hours. Urine analysis:    Component Value Date/Time   COLORURINE YELLOW 05/13/2016 0925   APPEARANCEUR CLEAR 05/13/2016 0925   LABSPEC >=1.030 08/22/2016 1941   PHURINE 6.0 08/22/2016 1941   GLUCOSEU NEGATIVE 08/22/2016 1941   HGBUR TRACE (A) 08/22/2016 1941   BILIRUBINUR NEGATIVE 08/22/2016 1941   KETONESUR NEGATIVE 08/22/2016 1941   PROTEINUR >=300 (A) 08/22/2016 1941   UROBILINOGEN 1.0 08/22/2016 1941   NITRITE NEGATIVE 08/22/2016 1941   LEUKOCYTESUR NEGATIVE 08/22/2016 1941     Domenique Southers [name] [facility] 05/15/2017, 11:58 AM  Pager: [phone] Between 7am to 7pm - call Pager - [phone]  After 7pm go to [email or web address] - password [email or web address]  Call night coverage person covering after 7pm

## 2017-05-15 NOTE — Progress Notes (Signed)
Pharmacy Antibiotic Note  Curtis FettersLamont Clark is a 41 y.o. male admitted on 05/13/2017 with right foot ulcer.  Pharmacy has been consulted for vancomycin and Zosyn dosing. MRI concerning for osteomyelitis and abscess. He is now s/p revision of right transmetatarsal amputation. CKD 3 noted, SCr 1.74, norm CrCl ~57 ml/min.    Plan: Vancomycin 2000 mg IV once then 1250 mg IV q24h Zosyn 3.375 g IV q8h to be infused over 4 hours Monitor renal function, clinical progress, and cultures Vancomycin trough as clinically indicated   Height: 6\' 1"  (185.4 cm) Weight: 265 lb (120.2 kg) IBW/kg (Calculated) : 79.9  Temp (24hrs), Avg:97.8 F (36.6 C), Min:97 F (36.1 C), Max:98.8 F (37.1 C)  Recent Labs  Lab 05/13/17 1301 05/13/17 1313 05/14/17 0526 05/15/17 0449  WBC 6.8  --  7.3 7.4  CREATININE 1.95*  --  1.60* 1.74*  LATICACIDVEN  --  1.63  --   --     Estimated Creatinine Clearance: 76.6 mL/min (A) (by C-G formula based on SCr of 1.74 mg/dL (H)).    Allergies  Allergen Reactions  . No Known Allergies     Antimicrobials this admission: vanc 12/7 >>  Zosyn 12/7 >>   Dose adjustments this admission:   Microbiology results: 12/6 MRSA PCR: NEG  Thank you for allowing pharmacy to be a part of this patient's care.  Loura BackJennifer Elk, PharmD, BCPS Clinical Pharmacist Phone for today [phone] Main pharmacy - [phone] 05/15/2017 1:59 PM

## 2017-05-15 NOTE — Anesthesia Procedure Notes (Signed)
Procedure Name: LMA Insertion Date/Time: 05/15/2017 11:58 AM Performed by: Army FossaPulliam, Hershey Knauer Dane, CRNA Pre-anesthesia Checklist: Patient identified, Emergency Drugs available, Suction available and Patient being monitored Patient Re-evaluated:Patient Re-evaluated prior to induction Oxygen Delivery Method: Circle System Utilized Preoxygenation: Pre-oxygenation with 100% oxygen Induction Type: IV induction Ventilation: Mask ventilation without difficulty LMA: LMA inserted LMA Size: 5.0 Number of attempts: 1 Airway Equipment and Method: Bite block Placement Confirmation: positive ETCO2 Tube secured with: Tape Dental Injury: Teeth and Oropharynx as per pre-operative assessment

## 2017-05-15 NOTE — Op Note (Signed)
05/13/2017 - 05/15/2017  12:15 PM  PATIENT:  [name]    PRE-OPERATIVE DIAGNOSIS:  Osteomyelitis Right Foot  POST-OPERATIVE DIAGNOSIS:  Same  PROCEDURE:  REVISION RIGHT TRANSMETATARSAL AMPUTATION  SURGEON:  Nadara MustardMarcus V Kirstan Fentress, MD  PHYSICIAN ASSISTANT:None ANESTHESIA:   General  PREOPERATIVE INDICATIONS:  [name] is a  41 y.o. male with a diagnosis of Osteomyelitis Right Foot who failed conservative measures and elected for surgical management.    The risks benefits and alternatives were discussed with the patient preoperatively including but not limited to the risks of infection, bleeding, nerve injury, cardiopulmonary complications, the need for revision surgery, among others, and the patient was willing to proceed.  OPERATIVE IMPLANTS: none  OPERATIVE FINDINGS: no abscess  OPERATIVE PROCEDURE: Patient was brought to the operating room and underwent a general anesthetic.  After adequate levels of anesthesia were obtained patient's right lower extremity was prepped using DuraPrep and draped in the sterile field a timeout was called.  A fishmouth incision was made around the ulcerative tissue.  This was carried down to bone and the transmetatarsal amputation was revised with approximately 2 cm of bone resected.  Electrocautery was used for hemostasis there was calcification of the vessels.  The wound was irrigated with normal saline there is no signs of infection at the amputation site.  The skin was closed using 2-0 nylon and a Praveena wound VAC was applied this will remain in place for 1 week.  Patient was extubated taken to the PACU in stable condition.   DISCHARGE PLANNING:  Antibiotic duration: 23-hour IV antibiotics postoperatively.  Weightbearing: Ideally nonweightbearing on the right lower extremity  Pain medication: As needed  Dressing care/ Wound VAC: Wound VAC to remain in place for 1 week.  Ambulatory devices: Walker or crutches  Discharge to: Home once safe  with therapy.  Patient could be discharged on Saturday.  Follow-up: In the office 1 week post operative.

## 2017-05-16 ENCOUNTER — Encounter (HOSPITAL_COMMUNITY): Payer: Self-pay | Admitting: Orthopedic Surgery

## 2017-05-16 LAB — BASIC METABOLIC PANEL
Anion gap: 10 (ref 5–15)
BUN: 29 mg/dL — ABNORMAL HIGH (ref 6–20)
CALCIUM: 8.7 mg/dL — AB (ref 8.9–10.3)
CO2: 24 mmol/L (ref 22–32)
CREATININE: 2.38 mg/dL — AB (ref 0.61–1.24)
Chloride: 97 mmol/L — ABNORMAL LOW (ref 101–111)
GFR, EST AFRICAN AMERICAN: 38 mL/min — AB (ref >60)
GFR, EST NON AFRICAN AMERICAN: 32 mL/min — AB (ref >60)
Glucose, Bld: 402 mg/dL — ABNORMAL HIGH (ref 65–99)
Potassium: 4.8 mmol/L (ref 3.5–5.1)
SODIUM: 131 mmol/L — AB (ref 135–145)

## 2017-05-16 LAB — CBC
HCT: 33.3 % — ABNORMAL LOW (ref 39.0–52.0)
Hemoglobin: 11 g/dL — ABNORMAL LOW (ref 13.0–17.0)
MCH: 29.6 pg (ref 26.0–34.0)
MCHC: 33 g/dL (ref 30.0–36.0)
MCV: 89.5 fL (ref 78.0–100.0)
PLATELETS: 239 10*3/uL (ref 150–400)
RBC: 3.72 MIL/uL — ABNORMAL LOW (ref 4.22–5.81)
RDW: 13.3 % (ref 11.5–15.5)
WBC: 11.7 10*3/uL — AB (ref 4.0–10.5)

## 2017-05-16 LAB — GLUCOSE, CAPILLARY: GLUCOSE-CAPILLARY: 381 mg/dL — AB (ref 65–99)

## 2017-05-16 MED ORDER — METHOCARBAMOL 500 MG PO TABS: 500.0000 mg | ORAL_TABLET | Freq: Four times a day (QID) | ORAL | 0 refills | Status: DC | PRN

## 2017-05-16 MED ORDER — INSULIN ASPART 100 UNIT/ML ~~LOC~~ SOLN: 8.0000 [IU] | Freq: Every day | SUBCUTANEOUS | 1 refills | Status: DC

## 2017-05-16 MED ORDER — INSULIN SYRINGES (DISPOSABLE) U-100 0.5 ML MISC: 3 refills | Status: AC

## 2017-05-16 MED ORDER — TRAMADOL HCL 50 MG PO TABS: 50.0000 mg | ORAL_TABLET | Freq: Four times a day (QID) | ORAL | 0 refills | Status: DC | PRN

## 2017-05-16 MED ORDER — INSULIN ASPART 100 UNIT/ML ~~LOC~~ SOLN: 8.0000 [IU] | Freq: Three times a day (TID) | SUBCUTANEOUS | 1 refills | Status: DC

## 2017-05-16 MED ORDER — OXYCODONE-ACETAMINOPHEN 5-325 MG PO TABS: 1.0000 | ORAL_TABLET | Freq: Four times a day (QID) | ORAL | 0 refills | Status: DC | PRN

## 2017-05-16 MED ORDER — GABAPENTIN 100 MG PO CAPS: 200.0000 mg | ORAL_CAPSULE | Freq: Three times a day (TID) | ORAL | 0 refills | Status: DC | PRN

## 2017-05-16 MED ORDER — INSULIN GLARGINE 100 UNIT/ML ~~LOC~~ SOLN: 25.0000 [IU] | Freq: Every day | SUBCUTANEOUS | 1 refills | Status: DC

## 2017-05-16 MED ORDER — INSULIN ASPART 100 UNIT/ML ~~LOC~~ SOLN: 5.0000 [IU] | Freq: Every day | SUBCUTANEOUS | 1 refills | Status: DC

## 2017-05-16 MED ORDER — INSULIN GLARGINE 100 UNIT/ML ~~LOC~~ SOLN: 28.0000 [IU] | Freq: Every day | SUBCUTANEOUS | 1 refills | Status: DC

## 2017-05-16 MED ORDER — BLOOD GLUCOSE MONITOR KIT: PACK | 0 refills | Status: AC

## 2017-05-16 MED ORDER — LISINOPRIL 10 MG PO TABS: 10.0000 mg | ORAL_TABLET | ORAL | 1 refills | Status: DC

## 2017-05-16 MED ORDER — INSULIN ASPART 100 UNIT/ML ~~LOC~~ SOLN: 8.0000 [IU] | Freq: Three times a day (TID) | SUBCUTANEOUS | Status: DC

## 2017-05-16 MED ORDER — INSULIN GLARGINE 100 UNIT/ML ~~LOC~~ SOLN: 28.0000 [IU] | Freq: Every day | SUBCUTANEOUS | Status: DC

## 2017-05-16 MED ORDER — DOXYCYCLINE HYCLATE 100 MG PO CAPS: 100.0000 mg | ORAL_CAPSULE | Freq: Two times a day (BID) | ORAL | 0 refills | Status: DC

## 2017-05-16 NOTE — Discharge Summary (Signed)
Physician Discharge Summary   Patient ID: [name] MRN: [id number] DOB/AGE: 40-Oct-1977 40 y.o.  Admit date: 05/13/2017 Discharge date: 05/16/2017  Primary Care Physician:  Lujean Amel, MD  Discharge Diagnoses:   . Diabetic foot ulcer with osteomyelitis ([place]) . Uncontrolled diabetes mellitus  . Benign essential HTN . CKD (chronic kidney disease), stage III ([place]) . Esophageal reflux . Gastroparesis . Polysubstance abuse ([place])   Consults: Orthopedics, Dr. Sharol Given  Recommendations for Outpatient Follow-up:  1. Continue wound VAC for 1 week, follow-up postop in 1 week 2. Please repeat CBC/BMET at next visit 3. Please follow hemoglobin A1c in 1 month   DIET: Carb modified diet    Allergies:   Allergies  Allergen Reactions  . No Known Allergies      DISCHARGE MEDICATIONS: Allergies as of 05/16/2017      Reactions   No Known Allergies       Medication List    STOP taking these medications   meloxicam 7.5 MG tablet Commonly known as:  MOBIC   mupirocin ointment 2 % Commonly known as:  BACTROBAN     TAKE these medications   atorvastatin 10 MG tablet Commonly known as:  LIPITOR Take 10 mg by mouth daily.   blood glucose meter kit and supplies Kit Dispense based on patient and insurance preference. Use up to four times daily as directed. (FOR ICD-9 250.00, 250.01).   doxycycline 100 MG capsule Commonly known as:  VIBRAMYCIN Take 1 capsule (100 mg total) by mouth 2 (two) times daily. X 1 WEEK   gabapentin 100 MG capsule Commonly known as:  NEURONTIN Take 2 capsules (200 mg total) by mouth 3 (three) times daily as needed (nerve pain).   insulin aspart 100 UNIT/ML injection Commonly known as:  novoLOG Inject 8 Units into the skin 3 (three) times daily with meals. What changed:    how much to take  when to take this   insulin glargine 100 UNIT/ML injection Commonly known as:  LANTUS Inject 0.28 mLs (28 Units total) into the skin at bedtime. What  changed:  how much to take   Insulin Syringes (Disposable) U-100 0.5 ML Misc Use with lantus and novolog vials.   lisinopril 10 MG tablet Commonly known as:  PRINIVIL,ZESTRIL Take 1 tablet (10 mg total) by mouth every morning. HOLD UNTIL FOLLOW-UP WITH YOUR DOCTOR What changed:  additional instructions   methocarbamol 500 MG tablet Commonly known as:  ROBAXIN Take 1 tablet (500 mg total) by mouth every 6 (six) hours as needed for muscle spasms.   oxyCODONE-acetaminophen 5-325 MG tablet Commonly known as:  PERCOCET/ROXICET Take 1 tablet by mouth every 6 (six) hours as needed for severe pain.   traMADol 50 MG tablet Commonly known as:  ULTRAM Take 1 tablet (50 mg total) by mouth every 6 (six) hours as needed for moderate pain. For pain. Use sparingly. What changed:  reasons to take this            Durable Medical Equipment  (From admission, onward)        Start     Ordered   05/16/17 0814  For home use only DME Walker rolling  Once    Comments:  5 inches wheels  Question:  Patient needs a walker to treat with the following condition  Answer:  Post-op pain   05/16/17 0814       Brief H and P: For complete details please refer to admission H and P, but in brief Per admit  note by Dr. Shanon Brow on 12/5 Tasman Solomonis a 41 y.o.malewith medical history significant ofpolysubstance abuse, DM, amputee of right forefoot by dr duda over a year ago comes in with open foul smelling wound to right forefoot that has been worsening for a week, with foul odor draining with no redness. Pt says his shoe caused a wound and it started with a blister then popped and is now bad. He denies any recent abx. No n/v/d. Pt referred for admission for concerns of new osteomyelitis of the foot. Sugar is over 500.   Hospital Course:   Diabetic right foot ulcer with osteomyelitis ([place]) in the setting of uncontrolled diabetes - X-ray of the right foot consistent with acute osteomyelitis, distal  soft tissue swelling and gas with suspected new bone destruction of the distal aspect of second metatarsal stump - MRI of the foot showed large skin wound centered over the remnant of the patient's second metatarsal, marrow edema in approximately distal 2.4 cm of the metatarsal remnant worrisome for osteomyelitis and abscess - Orthopedics was consulted, patient was placed on IV vancomycin and Zosyn -Patient underwent revision of right transmetatarsal amputation. -Dr. Sharol Given recommended wound VAC for 1 week, follow-up in 1 week postop.     Diabetes mellitus ([place]), uncontrolled -Hemoglobin A1c 14.2, on admission glucose 526, does not check blood sugars at home, states does not have glucometer -CBGs were uncontrolled during hospitalization, increase Lantus to 28 units, NovoLog 8 units 3 times a day with meals.  Patient was given prescriptions for glucometer, lancets strips, insulin syringes.  Strongly recommended to be compliant with his medications and tight glycemic control for healing of his right foot wound.    CKD (chronic kidney disease), stage III (HCC) - Baseline creatinine around 2, creatinine at the time of discharge 2.3 -Hold lisinopril until follow-up with PCP    Benign essential HTN -Stable and soft, hold lisinopril  Hyperlipidemia -Continue statin    Gastroparesis -Stable    Polysubstance abuse (HCC) -UDS positive for opiates and cocaine, counseled strongly on cessation  Day of Discharge BP 104/70 (BP Location: Left Arm)   Pulse 77   Temp 98.5 F (36.9 C) (Oral)   Resp 15   Ht '6\' 1"'$  (1.854 m)   Wt 120.2 kg (265 lb)   SpO2 95%   BMI 34.96 kg/m   Physical Exam: General: Alert and awake oriented x3 not in any acute distress. HEENT: anicteric sclera, pupils reactive to light and accommodation CVS: S1-S2 clear no murmur rubs or gallops Chest: clear to auscultation bilaterally, no wheezing rales or rhonchi Abdomen: soft nontender, nondistended, normal bowel  sounds Extremities: Dressing intact on the right foot with wound VAC Neuro: Cranial nerves II-XII intact, no focal neurological deficits   The results of significant diagnostics from this hospitalization (including imaging, microbiology, ancillary and laboratory) are listed below for reference.    LAB RESULTS: Basic Metabolic Panel: Recent Labs  Lab 05/15/17 0449 05/16/17 0517  NA 132* 131*  K 3.9 4.8  CL 99* 97*  CO2 27 24  GLUCOSE 336* 402*  BUN 21* 29*  CREATININE 1.74* 2.38*  CALCIUM 8.8* 8.7*   Liver Function Tests: Recent Labs  Lab 05/13/17 1301  AST 22  ALT 23  ALKPHOS 97  BILITOT 0.4  PROT 7.9  ALBUMIN 3.1*   No results for input(s): LIPASE, AMYLASE in the last 168 hours. No results for input(s): AMMONIA in the last 168 hours. CBC: Recent Labs  Lab 05/13/17 1301  05/15/17 0449 05/16/17  0517  WBC 6.8   < > 7.4 11.7*  NEUTROABS 4.4  --   --   --   HGB 12.0*   < > 11.2* 11.0*  HCT 35.8*   < > 34.2* 33.3*  MCV 89.9   < > 89.8 89.5  PLT 237   < > 235 239   < > = values in this interval not displayed.   Cardiac Enzymes: No results for input(s): CKTOTAL, CKMB, CKMBINDEX, TROPONINI in the last 168 hours. BNP: Invalid input(s): POCBNP CBG: Recent Labs  Lab 05/15/17 2210 05/16/17 0633  GLUCAP 451* 381*    Significant Diagnostic Studies:  Mri Right Foot Without Contrast  Result Date: 05/14/2017 CLINICAL DATA:  Soft tissue wound at the stump of the patient's right foot with discharge. Diabetic patient. EXAM: MRI OF THE RIGHT FOREFOOT WITHOUT CONTRAST TECHNIQUE: Multiplanar, multisequence MR imaging of the right forefoot was performed. No intravenous contrast was administered. COMPARISON:  Plain films right foot 05/13/2017 and 10/27/2016. MRI right foot 02/27/2016. FINDINGS: Bones/Joint/Cartilage As seen on the most recent plain films, the patient is status post amputation of the first ray at the level of the tarsometatarsal joint. Second through fifth  transmetatarsal amputations are also identified. Mild marrow edema is seen in the distal 2.4 cm of the second metatarsal remnant. Edema is most intense in the distal 0.7 cm of the metatarsal in its plantar aspect. Soft tissue edema surrounds the metatarsal remnant. Ligaments Intact. Muscles and Tendons No acute abnormality. There is some atrophy of intrinsic musculature the foot. No intramuscular fluid collection. Soft tissues Large skin wound on the stump is centered over the second metatarsal stump. A fluid collection surrounding the second metatarsal remnant up to measures approximately 1.6 cm long by 1.4 cm transverse by 1.8 cm craniocaudal. The collection is in the shape of the letter U. Diffuse subcutaneous edema is present about the foot. IMPRESSION: Large skin wound centered over the remnant of the patient's second metatarsal. Marrow edema is seen in approximately the distal 2.4 cm of the metatarsal remnant worrisome for osteomyelitis. Edema is most intense in the plantar aspect of the distal 0.7 cm of the metatarsal. Fluid collection the shape of the better U surrounding the distal second metatarsal is worrisome for abscess. Electronically Signed   By: Inge Rise [name]   On: 05/14/2017 10:46   Dg Foot Complete Right  Result Date: 05/13/2017 CLINICAL DATA:  Foul-smelling discharge from top of foot for 1 week, history diabetes mellitus EXAM: RIGHT FOOT COMPLETE - 3+ VIEW COMPARISON:  10/27/2016 FINDINGS: Prior transmetatarsal amputation of second through fifth rays with amputation of the entirety of the first metatarsal. Distal soft tissue swelling with soft tissue irregularity and gas at the distal stump. Bones demineralized. No acute fracture or dislocation. Question new cortical loss at the volar aspect of the stump of the second metatarsal concerning for acute osteomyelitis, new since prior exam. IMPRESSION: Distal soft tissue swelling and gas with suspected new bone destruction at the distal  aspect of the second metatarsal stump, concerning for acute osteomyelitis. Electronically Signed   By: Lavonia Dana [name]   On: 05/13/2017 18:19    2D ECHO:   Disposition and Follow-up: Discharge Instructions    Diet Carb Modified   Complete by:  As directed    Discharge instructions   Complete by:  As directed    Please keep wound vac on for 1 week.  It is VERY IMPORTANT that you follow up with a PCP on  a regular basis.  Check your blood glucoses before each meal and at bedtime and maintain a log of your readings.  Bring this log with you when you follow up with your PCP so that he or she can adjust your insulin at your follow up visit.   Increase activity slowly   Complete by:  As directed        DISPOSITION: Home   DISCHARGE FOLLOW-UP Follow-up Information    Newt Minion, MD Follow up in 1 week(s).   Specialty:  Orthopedic Surgery Contact information: [place] [address] [phone]        Lujean Amel, MD. Schedule an appointment as soon as possible for a visit in 2 week(s).   Specialty:  Family Medicine Why:  Bring the log of your blood sugars at home for insulin adjustment Contact information: [place] [address] [phone]            Time spent on Discharge: 35 minutes  Signed:   Estill Cotta [name] [facility] 05/16/2017, 11:19 AM Pager: [phone]

## 2017-05-16 NOTE — Care Management Note (Signed)
Case Management Note  Patient Details  Name: [name] MRN: [id number] Date of Birth: 07/25/1975  Subjective/Objective:              Pt presents for wound infection debrided in OR by Dr. Lajoyce Cornersuda.  Praveena Plus wound vac placed in OR.  Home wound vac applied in OR.   Pt states he has a cane but no walker at home.  No other equipment needed.   Action/Plan: Pt given 2 information sheets about wound vac management at home.  Instructed to keep dry and have removed in office at appointment in 7 days.  [facility] to deliver rolling walker to room prior to pt departure.    Expected Discharge Date:  05/16/17               Expected Discharge Plan:  Home/Self Care  In-House Referral:  NA  Discharge planning Services  CM Consult  Post Acute Care Choice:  Durable Medical Equipment Choice offered to:  Patient  DME Arranged:  Dan HumphreysWalker rolling, Vac DME Agency:  [facility] Other - Comment(Praveena Wound Vac placed in OR)  HH Arranged:  NA HH Agency:  NA  Status of Service:  Completed, signed off  If discussed at Long Length of Stay Meetings, dates discussed:    Additional Comments:  Verdene LennertGoldean, Jahmari Esbenshade K, RN 05/16/2017, 9:17 AM

## 2017-05-22 ENCOUNTER — Ambulatory Visit (INDEPENDENT_AMBULATORY_CARE_PROVIDER_SITE_OTHER): Payer: PPO | Admitting: Family

## 2017-05-22 ENCOUNTER — Encounter (INDEPENDENT_AMBULATORY_CARE_PROVIDER_SITE_OTHER): Payer: Self-pay | Admitting: Family

## 2017-05-22 VITALS — Ht 73.0 in | Wt 265.0 lb

## 2017-05-22 DIAGNOSIS — E1165 Type 2 diabetes mellitus with hyperglycemia: Secondary | ICD-10-CM

## 2017-05-22 DIAGNOSIS — E1151 Type 2 diabetes mellitus with diabetic peripheral angiopathy without gangrene: Secondary | ICD-10-CM | POA: Diagnosis not present

## 2017-05-22 DIAGNOSIS — IMO0002 Reserved for concepts with insufficient information to code with codable children: Secondary | ICD-10-CM

## 2017-05-22 DIAGNOSIS — Z89431 Acquired absence of right foot: Secondary | ICD-10-CM

## 2017-05-22 MED ORDER — OXYCODONE-ACETAMINOPHEN 5-325 MG PO TABS: 1.0000 | ORAL_TABLET | Freq: Four times a day (QID) | ORAL | 0 refills | Status: DC | PRN

## 2017-05-22 NOTE — Progress Notes (Signed)
Post-Op Visit Note   Patient: [name]           Date of Birth: 06/13/1975           MRN: [id number] Visit Date: 05/22/2017 PCP: Darrow BussingKoirala, [place]as, MD  Chief Complaint:  Chief Complaint  Patient presents with  . Right Foot - Routine Post Op    05/15/17 transmet revision     HPI:  HPI Patient is a 41 year old gentleman seen 1 week status post revision of right transmetatarsal amputation. Has been walking with walker.  Ortho Exam Incision well approximated with sutures. Some bleeding. No erythema, no sign of infection.  Visit Diagnoses:  1. DM (diabetes mellitus) type II uncontrolled, periph vascular disorder (HCC)   2. Status post transmetatarsal amputation of foot, right (HCC)     Plan: begin daily dial soap cleansing. Apply dry dressings. Nonweightbearing crutches provided.   Follow-Up Instructions: Return in about 2 weeks (around 06/08/2017).   Imaging: No results found.  Orders:  No orders of the defined types were placed in this encounter.  Meds ordered this encounter  Medications  . oxyCODONE-acetaminophen (PERCOCET/ROXICET) 5-325 MG tablet    Sig: Take 1 tablet by mouth every 6 (six) hours as needed for severe pain.    Dispense:  60 tablet    Refill:  0     PMFS History: Patient Active Problem List   Diagnosis Date Noted  . Osteomyelitis (HCC) 05/13/2017  . Acute osteomyelitis (HCC)   . Diabetic polyneuropathy associated with type 2 diabetes mellitus (HCC) 07/10/2016  . Status post transmetatarsal amputation of foot, right (HCC) 07/10/2016  . Acquired contracture of Achilles tendon, right   . Acquired absence of right foot (HCC) 05/14/2016  . Polysubstance abuse (HCC) 03/08/2016  . Tobacco abuse 02/27/2016  . Gastroparesis 05/28/2015  . GERD (gastroesophageal reflux disease) 10/01/2014  . Right foot infection 10/01/2014  . Esophageal reflux   . Fever 09/27/2014  . Abdominal pain, lower 09/27/2014  . Abnormal ECG 05/30/2014  . Chest pain  05/30/2014  . Ankle pain 05/30/2014  . Pain in the chest   . Nausea 08/30/2013  . Epigastric abdominal pain 08/30/2013  . Low grade fever 08/30/2013  . Diabetic foot ulcer with osteomyelitis (HCC) 05/12/2013  . Diabetic osteomyelitis b/l toes 03/23/2013  . DM (diabetes mellitus) type II uncontrolled, periph vascular disorder (HCC) 03/23/2013  . CKD (chronic kidney disease), stage III (HCC) 03/23/2013  . Anemia 03/23/2013  . Benign essential HTN 03/23/2013  . AKI (acute kidney injury) (HCC) 03/22/2013  . Viral gastroenteritis 09/17/2012  . Nausea & vomiting 09/16/2012  . Acute pancreatitis 09/16/2012  . Diabetes mellitus (HCC) 09/20/2010  . Onychomycosis 09/20/2010  . METHICILLIN SUSCEPTIBLE STAPH AUREUS SEPTICEMIA 07/30/2010   Past Medical History:  Diagnosis Date  . Acute osteomyelitis, ankle and foot 07/30/2010   Qualifier: Diagnosis of  By: Daiva EvesVan Dam MD, Remi Haggardornelius    . Anemia   . Chronic kidney disease (CKD), stage III (moderate) (HCC)   . Collagen vascular disease (HCC)   . DDD (degenerative disc disease), lumbar   . Dehiscence of amputation stump (HCC)     dehiscence right transmetetarsal amputation achilles contracture  . Diabetic foot ulcer (HCC) 05/14/2017  . Gastroparesis   . GERD (gastroesophageal reflux disease)   . Headache   . Hiatal hernia   . Hyperlipidemia   . Hypertension   . Pancreatitis   . Peripheral vascular disease (HCC)   . Polysubstance abuse (HCC) 03/08/2016  . Renal  insufficiency   . Type II diabetes mellitus (HCC) dx'd ~ 1996  . Vascular disease    poor circulation to left foot    Family History  Problem Relation Age of Onset  . Heart attack Father [id number]  . Hypertension Sister     Past Surgical History:  Procedure Laterality Date  . AMPUTATION  04/25/2011   Procedure: AMPUTATION DIGIT;  Surgeon: Nadara MustardMarcus V Duda, MD;  [facility];  Service: Orthopedics;  Laterality: Left;  Left foot 3rd toe amputation MTP joint, Gastroc Recession  Achilles  Lengthening   . AMPUTATION Bilateral 03/25/2013   Procedure: AMPUTATION RAY;  Surgeon: Nadara MustardMarcus V Duda, MD;  Location: [facility] OR;  Service: Orthopedics;  Laterality: Bilateral;  Left Great Toe Amputation at  MTP Joint, Right 1st and 2nd Ray Amputation   . AMPUTATION Right 10/03/2014   Procedure: AMPUTATION MIDFOOT;  Surgeon: Nadara MustardMarcus Duda V, MD;  [facility];  Service: Orthopedics;  Laterality: Right;  . LAPAROSCOPIC CHOLECYSTECTOMY    . STUMP REVISION Right 05/23/2016   Procedure: Revision Right Transmetatarsal Amputation, Right Gastrocnemius Recession;  Surgeon: Nadara MustardMarcus V Duda, MD;  Location: [facility] OR;  Service: Orthopedics;  Laterality: Right;  . STUMP REVISION Right 05/15/2017   Procedure: REVISION RIGHT TRANSMETATARSAL AMPUTATION;  Surgeon: Nadara Mustarduda, Marcus V, MD;  [facility];  Service: Orthopedics;  Laterality: Right;  . TOE AMPUTATION  2012   left foot; great toe and second toe   Social History   Occupational History  . Not on file  Tobacco Use  . Smoking status: Former Smoker    Packs/day: 0.10    Years: 4.00    Pack years: 0.40    Types: Cigarettes    Last attempt to quit: 06/10/2015    Years since quitting: 1.9  . Smokeless tobacco: Never Used  Substance and Sexual Activity  . Alcohol use: No  . Drug use: Yes    Frequency: 10.0 times per week    Types: Marijuana  . Sexual activity: Yes    Birth control/protection: None

## 2017-05-29 ENCOUNTER — Emergency Department (HOSPITAL_COMMUNITY)
Admission: EM | Admit: 2017-05-29 | Discharge: 2017-05-29 | Disposition: A | Discharge status: Home / Self Care | Payer: PPO | Attending: Emergency Medicine | Admitting: Emergency Medicine

## 2017-05-29 ENCOUNTER — Other Ambulatory Visit: Payer: Self-pay

## 2017-05-29 ENCOUNTER — Encounter (HOSPITAL_COMMUNITY): Payer: Self-pay | Admitting: Nurse Practitioner

## 2017-05-29 ENCOUNTER — Emergency Department (HOSPITAL_COMMUNITY): Payer: PPO

## 2017-05-29 DIAGNOSIS — S98921A Partial traumatic amputation of right foot, level unspecified, initial encounter: Secondary | ICD-10-CM | POA: Diagnosis not present

## 2017-05-29 DIAGNOSIS — Z794 Long term (current) use of insulin: Secondary | ICD-10-CM | POA: Insufficient documentation

## 2017-05-29 DIAGNOSIS — Z89421 Acquired absence of other right toe(s): Secondary | ICD-10-CM | POA: Insufficient documentation

## 2017-05-29 DIAGNOSIS — I129 Hypertensive chronic kidney disease with stage 1 through stage 4 chronic kidney disease, or unspecified chronic kidney disease: Secondary | ICD-10-CM | POA: Insufficient documentation

## 2017-05-29 DIAGNOSIS — E1122 Type 2 diabetes mellitus with diabetic chronic kidney disease: Secondary | ICD-10-CM | POA: Diagnosis not present

## 2017-05-29 DIAGNOSIS — E1165 Type 2 diabetes mellitus with hyperglycemia: Secondary | ICD-10-CM | POA: Insufficient documentation

## 2017-05-29 DIAGNOSIS — Z87891 Personal history of nicotine dependence: Secondary | ICD-10-CM | POA: Diagnosis not present

## 2017-05-29 DIAGNOSIS — T819XXA Unspecified complication of procedure, initial encounter: Secondary | ICD-10-CM | POA: Diagnosis not present

## 2017-05-29 DIAGNOSIS — G8918 Other acute postprocedural pain: Secondary | ICD-10-CM | POA: Diagnosis not present

## 2017-05-29 DIAGNOSIS — Z79899 Other long term (current) drug therapy: Secondary | ICD-10-CM | POA: Insufficient documentation

## 2017-05-29 DIAGNOSIS — L089 Local infection of the skin and subcutaneous tissue, unspecified: Secondary | ICD-10-CM

## 2017-05-29 DIAGNOSIS — F1490 Cocaine use, unspecified, uncomplicated: Secondary | ICD-10-CM | POA: Diagnosis not present

## 2017-05-29 DIAGNOSIS — F13920 Sedative, hypnotic or anxiolytic use, unspecified with intoxication, uncomplicated: Secondary | ICD-10-CM | POA: Insufficient documentation

## 2017-05-29 DIAGNOSIS — T8189XA Other complications of procedures, not elsewhere classified, initial encounter: Secondary | ICD-10-CM | POA: Diagnosis not present

## 2017-05-29 DIAGNOSIS — M79671 Pain in right foot: Secondary | ICD-10-CM | POA: Diagnosis not present

## 2017-05-29 DIAGNOSIS — T8149XA Infection following a procedure, other surgical site, initial encounter: Secondary | ICD-10-CM | POA: Diagnosis not present

## 2017-05-29 DIAGNOSIS — N183 Chronic kidney disease, stage 3 (moderate): Secondary | ICD-10-CM | POA: Diagnosis not present

## 2017-05-29 DIAGNOSIS — T148XXA Other injury of unspecified body region, initial encounter: Secondary | ICD-10-CM

## 2017-05-29 LAB — CBC WITH DIFFERENTIAL/PLATELET
Basophils Absolute: 0 10*3/uL (ref 0.0–0.1)
Basophils Relative: 0 %
EOS PCT: 2 %
Eosinophils Absolute: 0.2 10*3/uL (ref 0.0–0.7)
HCT: 33.4 % — ABNORMAL LOW (ref 39.0–52.0)
Hemoglobin: 11.1 g/dL — ABNORMAL LOW (ref 13.0–17.0)
LYMPHS PCT: 16 %
Lymphs Abs: 1.8 10*3/uL (ref 0.7–4.0)
MCH: 30 pg (ref 26.0–34.0)
MCHC: 33.2 g/dL (ref 30.0–36.0)
MCV: 90.3 fL (ref 78.0–100.0)
MONO ABS: 0.5 10*3/uL (ref 0.1–1.0)
MONOS PCT: 4 %
Neutro Abs: 8.8 10*3/uL — ABNORMAL HIGH (ref 1.7–7.7)
Neutrophils Relative %: 78 %
PLATELETS: 244 10*3/uL (ref 150–400)
RBC: 3.7 MIL/uL — ABNORMAL LOW (ref 4.22–5.81)
RDW: 13.2 % (ref 11.5–15.5)
WBC: 11.3 10*3/uL — ABNORMAL HIGH (ref 4.0–10.5)

## 2017-05-29 LAB — RAPID URINE DRUG SCREEN, HOSP PERFORMED
AMPHETAMINES: NOT DETECTED
BENZODIAZEPINES: POSITIVE — AB
Barbiturates: NOT DETECTED
COCAINE: POSITIVE — AB
OPIATES: NOT DETECTED
Tetrahydrocannabinol: NOT DETECTED

## 2017-05-29 LAB — BASIC METABOLIC PANEL
Anion gap: 8 (ref 5–15)
BUN: 21 mg/dL — AB (ref 6–20)
CALCIUM: 9.1 mg/dL (ref 8.9–10.3)
CO2: 27 mmol/L (ref 22–32)
Chloride: 99 mmol/L — ABNORMAL LOW (ref 101–111)
Creatinine, Ser: 1.98 mg/dL — ABNORMAL HIGH (ref 0.61–1.24)
GFR calc Af Amer: 47 mL/min — ABNORMAL LOW (ref >60)
GFR, EST NON AFRICAN AMERICAN: 41 mL/min — AB (ref >60)
GLUCOSE: 315 mg/dL — AB (ref 65–99)
Potassium: 4.4 mmol/L (ref 3.5–5.1)
Sodium: 134 mmol/L — ABNORMAL LOW (ref 135–145)

## 2017-05-29 LAB — CBG MONITORING, ED: Glucose-Capillary: 304 mg/dL — ABNORMAL HIGH (ref 65–99)

## 2017-05-29 MED ORDER — OXYCODONE-ACETAMINOPHEN 5-325 MG PO TABS
1.0000 | ORAL_TABLET | Freq: Once | ORAL | Status: AC
  Administered 2017-05-29: 1 via ORAL
  Filled 2017-05-29: qty 1
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
## 2017-05-29 NOTE — ED Provider Notes (Signed)
[facility] DEPT Provider Note   CSN: [id number] Arrival date & time: 05/29/17  1415     History   Chief Complaint Chief Complaint  Patient presents with  . Wound Infection    HPI [name] is a 41 y.o. Male with a history of diabetes, hypertension, hyperlipidemia, peripheral vascular disease, and recent admission for acute osteomyelitis, presents via [employer] today after he was combative with EMS.  Recent recently discharged from the hospital on 12/8 after admission for acute osteomyelitis of the right foot, underwent IV antibiotics and amputation revision with Dr. Sharol Given, seen for follow-up on the office on 12/14, and appears to be doing well, with mild bleeding, no evidence of infection.  Patient was outside of his apartment complex today and someone called EMS because the patient's foot was bleeding, when EMS arrived on the scene supposedly the patient became combative and tried to attack EMS so [employer] was contacted.  Reports he was combative because he did not want to come to the hospital today.  She reports his shoe slipped off and he hit his foot on something, and thinks that is why it is bleeding.  Patient denies any fevers or chills, reports mild pain on the right foot, with some bleeding, but no purulent drainage.  Reports he finished his course of antibiotics 2 days ago.  Patient is calm and cooperative throughout the encounter, and is not combative in the ED, reports he has a history of aggressive behavior, and has been doing anger management and counseling.  He denies any chest pain, shortness of breath, abdominal pain nausea or vomiting.       Past Medical History:  Diagnosis Date  . Acute osteomyelitis, ankle and foot 07/30/2010   Qualifier: Diagnosis of  By: Tommy Medal MD, Roderic Scarce    . Anemia   . Chronic kidney disease (CKD), stage III (moderate) (HCC)   . Collagen vascular disease ([place])   . DDD (degenerative disc disease), lumbar   . Dehiscence  of amputation stump (HCC)     dehiscence right transmetetarsal amputation achilles contracture  . Diabetic foot ulcer ([place]) 05/14/2017  . Gastroparesis   . GERD (gastroesophageal reflux disease)   . Headache   . Hiatal hernia   . Hyperlipidemia   . Hypertension   . Pancreatitis   . Peripheral vascular disease ([place])   . Polysubstance abuse ([place]) 03/08/2016  . Renal insufficiency   . Type II diabetes mellitus ([place]) dx'd ~ 1996  . Vascular disease    poor circulation to left foot    Patient Active Problem List   Diagnosis Date Noted  . Osteomyelitis ([place]) 05/13/2017  . Acute osteomyelitis ([place])   . Diabetic polyneuropathy associated with type 2 diabetes mellitus ([place]) 07/10/2016  . Status post transmetatarsal amputation of foot, right ([place]) 07/10/2016  . Acquired contracture of Achilles tendon, right   . Acquired absence of right foot ([place]) 05/14/2016  . Polysubstance abuse ([place]) 03/08/2016  . Tobacco abuse 02/27/2016  . Gastroparesis 05/28/2015  . GERD (gastroesophageal reflux disease) 10/01/2014  . Right foot infection 10/01/2014  . Esophageal reflux   . Fever 09/27/2014  . Abdominal pain, lower 09/27/2014  . Abnormal ECG 05/30/2014  . Chest pain 05/30/2014  . Ankle pain 05/30/2014  . Pain in the chest   . Nausea 08/30/2013  . Epigastric abdominal pain 08/30/2013  . Low grade fever 08/30/2013  . Diabetic foot ulcer with osteomyelitis ([place]) 05/12/2013  . Diabetic osteomyelitis b/l toes 03/23/2013  .  DM (diabetes mellitus) type II uncontrolled, periph vascular disorder ([place]) 03/23/2013  . CKD (chronic kidney disease), stage III ([place]) 03/23/2013  . Anemia 03/23/2013  . Benign essential HTN 03/23/2013  . AKI (acute kidney injury) ([place]) 03/22/2013  . Viral gastroenteritis 09/17/2012  . Nausea & vomiting 09/16/2012  . Acute pancreatitis 09/16/2012  . Diabetes mellitus ([place]) 09/20/2010  . Onychomycosis 09/20/2010  . METHICILLIN SUSCEPTIBLE STAPH AUREUS SEPTICEMIA  07/30/2010    Past Surgical History:  Procedure Laterality Date  . AMPUTATION  04/25/2011   Procedure: AMPUTATION DIGIT;  Surgeon: Newt Minion, MD;  Location: [facility];  Service: Orthopedics;  Laterality: Left;  Left foot 3rd toe amputation MTP joint, Gastroc Recession  Achilles Lengthening   . AMPUTATION Bilateral 03/25/2013   Procedure: AMPUTATION RAY;  Surgeon: Newt Minion, MD;  Location: [facility];  Service: Orthopedics;  Laterality: Bilateral;  Left Great Toe Amputation at  MTP Joint, Right 1st and 2nd Ray Amputation   . AMPUTATION Right 10/03/2014   Procedure: AMPUTATION MIDFOOT;  Surgeon: Newt Minion, MD;  Location: [facility];  Service: Orthopedics;  Laterality: Right;  . LAPAROSCOPIC CHOLECYSTECTOMY    . STUMP REVISION Right 05/23/2016   Procedure: Revision Right Transmetatarsal Amputation, Right Gastrocnemius Recession;  Surgeon: Newt Minion, MD;  Location: [facility];  Service: Orthopedics;  Laterality: Right;  . STUMP REVISION Right 05/15/2017   Procedure: REVISION RIGHT TRANSMETATARSAL AMPUTATION;  Surgeon: Newt Minion, MD;  Location: [facility];  Service: Orthopedics;  Laterality: Right;  . TOE AMPUTATION  2012   left foot; great toe and second toe       Home Medications    Prior to Admission medications   Medication Sig Start Date End Date Taking? Authorizing Provider  atorvastatin (LIPITOR) 10 MG tablet Take 10 mg by mouth daily. 03/29/17  Yes [provider]  insulin aspart (NOVOLOG) 100 UNIT/ML injection Inject 8 Units into the skin 3 (three) times daily with meals. 05/16/17  Yes Rai, Ripudeep K, MD  insulin glargine (LANTUS) 100 UNIT/ML injection Inject 0.28 mLs (28 Units total) into the skin at bedtime. Patient taking differently: Inject 30 Units into the skin at bedtime.  05/16/17  Yes Rai, Ripudeep K, MD  lisinopril (PRINIVIL,ZESTRIL) 10 MG tablet Take 1 tablet (10 mg total) by mouth every morning. HOLD UNTIL FOLLOW-UP WITH YOUR DOCTOR 05/16/17  Yes Rai, Ripudeep K, MD    oxyCODONE-acetaminophen (PERCOCET/ROXICET) 5-325 MG tablet Take 1 tablet by mouth every 6 (six) hours as needed for severe pain. 05/22/17  Yes Suzan Slick, NP  traMADol (ULTRAM) 50 MG tablet Take 1 tablet (50 mg total) by mouth every 6 (six) hours as needed for moderate pain. For pain. Use sparingly. 05/16/17  Yes Rai, Ripudeep K, MD  blood glucose meter kit and supplies KIT Dispense based on patient and insurance preference. Use up to four times daily as directed. (FOR ICD-9 250.00, 250.01). 05/16/17   Rai, Vernelle Emerald, MD  doxycycline (VIBRAMYCIN) 100 MG capsule Take 1 capsule (100 mg total) by mouth 2 (two) times daily. X 1 WEEK Patient not taking: Reported on 05/29/2017 05/16/17   Rai, Vernelle Emerald, MD  gabapentin (NEURONTIN) 100 MG capsule Take 2 capsules (200 mg total) by mouth 3 (three) times daily as needed (nerve pain). Patient not taking: Reported on 05/29/2017 05/16/17   Rai, Vernelle Emerald, MD  Insulin Syringes, Disposable, U-100 0.5 ML MISC Use with lantus and novolog vials. 05/16/17   Rai, Vernelle Emerald, MD  methocarbamol (ROBAXIN)  500 MG tablet Take 1 tablet (500 mg total) by mouth every 6 (six) hours as needed for muscle spasms. Patient not taking: Reported on 05/29/2017 05/16/17   Mendel Corning, MD    Family History Family History  Problem Relation Age of Onset  . Heart attack Father 30  . Hypertension Sister     Social History Social History   Tobacco Use  . Smoking status: Former Smoker    Packs/day: 0.10    Years: 4.00    Pack years: 0.40    Types: Cigarettes    Last attempt to quit: 06/10/2015    Years since quitting: 1.9  . Smokeless tobacco: Never Used  Substance Use Topics  . Alcohol use: No  . Drug use: Yes    Frequency: 10.0 times per week    Types: Marijuana     Allergies   No known allergies   Review of Systems Review of Systems  Constitutional: Negative for chills and fever.  HENT: Negative.   Eyes: Negative for visual disturbance.  Respiratory:  Negative for shortness of breath.   Cardiovascular: Negative for chest pain.  Gastrointestinal: Negative for nausea and vomiting.  Genitourinary: Negative for dysuria.  Musculoskeletal: Negative for arthralgias and myalgias.  Skin: Positive for wound.  Neurological: Negative for weakness and numbness.  Psychiatric/Behavioral: Negative for agitation and behavioral problems.     Physical Exam Updated Vital Signs BP 116/76   Pulse 81   Temp 98 F (36.7 C) (Oral)   Resp 16   Ht '6\' 1"'$  (1.854 m)   Wt 120.2 kg (265 lb)   SpO2 100%   BMI 34.96 kg/m   Physical Exam  Constitutional: He appears well-developed and well-nourished. No distress.  Patient is calm and cooperative on evaluation, no combative behavior  HENT:  Head: Normocephalic and atraumatic.  Eyes: Right eye exhibits no discharge. Left eye exhibits no discharge.  Cardiovascular: Normal rate, regular rhythm and normal heart sounds.  Pulses:      Dorsalis pedis pulses are 2+ on the right side, and 2+ on the left side.       Posterior tibial pulses are 2+ on the right side, and 2+ on the left side.  Pulmonary/Chest: Effort normal and breath sounds normal. No stridor. No respiratory distress. He has no wheezes. He has no rales.  Abdominal: Soft. Bowel sounds are normal. He exhibits no distension and no mass. There is no tenderness. There is no guarding.  Musculoskeletal:  Amputation of right foot, with recent revision, sutures in place, with sanguinous fluid coming from wound, some swelling and erythema, no purulent drainage, swelling does not extend past the foot, DP and TP pulses 2+, see photos below  Neurological: He is alert. Coordination normal.  Skin: Skin is warm and dry. Capillary refill takes less than 2 seconds. He is not diaphoretic.  Psychiatric: He has a normal mood and affect. His behavior is normal.  Nursing note and vitals reviewed.         ED Treatments / Results  Labs (all labs ordered are listed,  but only abnormal results are displayed) Labs Reviewed  RAPID URINE DRUG SCREEN, HOSP PERFORMED - Abnormal; Notable for the following components:      Result Value   Cocaine POSITIVE (*)    Benzodiazepines POSITIVE (*)    All other components within normal limits  CBC WITH DIFFERENTIAL/PLATELET - Abnormal; Notable for the following components:   WBC 11.3 (*)    RBC 3.70 (*)  Hemoglobin 11.1 (*)    HCT 33.4 (*)    Neutro Abs 8.8 (*)    All other components within normal limits  BASIC METABOLIC PANEL - Abnormal; Notable for the following components:   Sodium 134 (*)    Chloride 99 (*)    Glucose, Bld 315 (*)    BUN 21 (*)    Creatinine, Ser 1.98 (*)    GFR calc non Af Amer 41 (*)    GFR calc Af Amer 47 (*)    All other components within normal limits  CBG MONITORING, ED - Abnormal; Notable for the following components:   Glucose-Capillary 304 (*)    All other components within normal limits  CULTURE, BLOOD (ROUTINE X 2)  CULTURE, BLOOD (ROUTINE X 2)    EKG  EKG Interpretation None       Radiology Dg Foot Complete Right  Result Date: 05/29/2017 CLINICAL DATA:  Partial amputation right foot with open wounds EXAM: RIGHT FOOT COMPLETE - 3+ VIEW COMPARISON:  05/14/2017, 05/13/2017 FINDINGS: The patient is status post transmetatarsal amputation of the second through fifth digits with amputation of the first digit at the level of the TMT joint. Interval resection of the distal aspect of the second metatarsal with smooth appearance of the cut edge. Loss of distal portion of third metatarsal with slightly irregular appearance at the cut margin. Possible small bone fragment adjacent to the third metatarsal. Soft tissue swelling and gas collection anterior to the second metatarsal. IMPRESSION: 1. Large gas collection in the soft tissues of the amputated foot, distal to the second metatarsal, presumably related to open/dehiscent wound 2. Second through fifth transmetatarsal amputation  with interval resection of the distal portion of the second metatarsal with smooth appearance of the cut edge. 3. Loss of the distal aspect of the third metatarsal with slightly irregular cut edge margin, not sure if this is related to interval resection versus bone destruction related to osteomyelitis. Electronically Signed   By: Donavan Foil [name]   On: 05/29/2017 18:07    Procedures Procedures (including critical care time)  Medications Ordered in ED Medications  oxyCODONE-acetaminophen (PERCOCET/ROXICET) 5-325 MG per tablet 1 tablet (1 tablet Oral Given 05/29/17 1908)     Initial Impression / Assessment and Plan / ED Course  I have reviewed the triage vital signs and the nursing notes.  Pertinent labs & imaging results that were available during my care of the patient were reviewed by me and considered in my medical decision making (see chart for details).  Patient presents via EMS and [employer], initially with combative behavior, which patient reports is primarily because he done not want to come to the ED, EMS was called by a neighbor for bleeding of right foot.  Recent admission for osteomyelitis with revision of partial amputation.  Sutures in place with some erythema, swelling and sanguinous drainage.  Patient is afebrile and vitals are normal.  Will get baseline labs and x-ray.  Labs show minimal leukocytosis, hemoglobin stable, glucose is elevated to 315, although on review of chart this appears to be patient's baseline, any function at baseline as well, UDS positive for cocaine and benzos.  Cocaine use and hyperglycemia likely leading to poor wound healing.  Blood cultures collected.   X-ray of right foot shows a large gas collection distal to the second metatarsal, suggestive of open or dehisced wound, there is also some irregular edge margins of the third metatarsal, which could be from recent revision versus osteomyelitis.  Dr. Ninfa Linden  is on-call for [facility], consult  placed given x-ray findings  Spoke with Dr. Rush Farmer with orthopedics, who reviewed the photographs and x-rays available, he reports gas seen on x-ray as expected as wound is still open, recommends redressing the wound and outpatient follow-up as planned.  He does not recommend additional antibiotics at this time.  Patient has follow-up appointment scheduled for December 31, encourage patient to call office Monday morning, to be seen sooner.  Discussed strict return precautions regarding signs of worsening infection.  New boot provided to the patient  Patient discussed with Dr. Rogene Houston, who saw patient as well and agrees with plan.   Final Clinical Impressions(s) / ED Diagnoses   Final diagnoses:  Right foot pain  Wound infection    ED Discharge Orders    None       Jacqlyn Larsen, PA-C 05/30/17 0218    Jacqlyn Larsen, PA-C 05/30/17 3112    Fredia Sorrow, MD 06/01/17 [phone]

## 2017-05-29 NOTE — ED Notes (Signed)
Bed: WA31 Expected date:  Expected time:  Means of arrival:  Comments: 

## 2017-05-29 NOTE — Discharge Instructions (Signed)
Please return to the ED if you have fevers or chills, worsening pain in the foot, swelling, redness, or increased drainage.  Call on Monday morning to your orthopedics office to see if they want to see you sooner, otherwise follow-up as planned.

## 2017-05-29 NOTE — ED Provider Notes (Addendum)
Medical screening examination/treatment/procedure(s) were conducted as a shared visit with non-physician practitioner(s) and myself.  I personally evaluated the patient during the encounter.   EKG Interpretation None      Patient seen by me along with physician assistant.  Patient with partial amputation to his right foot about a month ago.  Missed follow-up with orthopedics.  Patient brought in because out of concerns of him being combative.  The patient states mostly it was because he did not want to come in.  He states that his right foot is at increased pain and swelling.  There seems to be a discharge there for about a week.  The foot appears as if there could be significant infection.  X-rays show evidence of some osteomyelitis and some some gas but this could be due to the fact that the sutures are loose.  But still concerning possibly for gangrene.  Discussed with orthopedics they reviewed everything and including pictures and felt that it was safe for patient to be discharged home with close follow-up with orthopedics.  Basic labs will be reviewed to make sure that there is nothing that is significant that would prevent him being discharged home.  Here patient's been very cooperative.    Results for orders placed or performed during the hospital encounter of 05/29/17  Rapid urine drug screen (hospital performed)  Result Value Ref Range   Opiates NONE DETECTED NONE DETECTED   Cocaine POSITIVE (A) NONE DETECTED   Benzodiazepines POSITIVE (A) NONE DETECTED   Amphetamines NONE DETECTED NONE DETECTED   Tetrahydrocannabinol NONE DETECTED NONE DETECTED   Barbiturates NONE DETECTED NONE DETECTED  CBG monitoring, ED  Result Value Ref Range   Glucose-Capillary 304 (H) 65 - 99 mg/dL   Mri Right Foot Without Contrast  Result Date: 05/14/2017 CLINICAL DATA:  Soft tissue wound at the stump of the patient's right foot with discharge. Diabetic patient. EXAM: MRI OF THE RIGHT FOREFOOT WITHOUT  CONTRAST TECHNIQUE: Multiplanar, multisequence MR imaging of the right forefoot was performed. No intravenous contrast was administered. COMPARISON:  Plain films right foot 05/13/2017 and 10/27/2016. MRI right foot 02/27/2016. FINDINGS: Bones/Joint/Cartilage As seen on the most recent plain films, the patient is status post amputation of the first ray at the level of the tarsometatarsal joint. Second through fifth transmetatarsal amputations are also identified. Mild marrow edema is seen in the distal 2.4 cm of the second metatarsal remnant. Edema is most intense in the distal 0.7 cm of the metatarsal in its plantar aspect. Soft tissue edema surrounds the metatarsal remnant. Ligaments Intact. Muscles and Tendons No acute abnormality. There is some atrophy of intrinsic musculature the foot. No intramuscular fluid collection. Soft tissues Large skin wound on the stump is centered over the second metatarsal stump. A fluid collection surrounding the second metatarsal remnant up to measures approximately 1.6 cm long by 1.4 cm transverse by 1.8 cm craniocaudal. The collection is in the shape of the letter U. Diffuse subcutaneous edema is present about the foot. IMPRESSION: Large skin wound centered over the remnant of the patient's second metatarsal. Marrow edema is seen in approximately the distal 2.4 cm of the metatarsal remnant worrisome for osteomyelitis. Edema is most intense in the plantar aspect of the distal 0.7 cm of the metatarsal. Fluid collection the shape of the better U surrounding the distal second metatarsal is worrisome for abscess. Electronically Signed   By: Drusilla Kannerhomas  Dalessio [name]   On: 05/14/2017 10:46   Dg Foot Complete Right  Result Date: 05/29/2017  CLINICAL DATA:  Partial amputation right foot with open wounds EXAM: RIGHT FOOT COMPLETE - 3+ VIEW COMPARISON:  05/14/2017, 05/13/2017 FINDINGS: The patient is status post transmetatarsal amputation of the second through fifth digits with amputation  of the first digit at the level of the TMT joint. Interval resection of the distal aspect of the second metatarsal with smooth appearance of the cut edge. Loss of distal portion of third metatarsal with slightly irregular appearance at the cut margin. Possible small bone fragment adjacent to the third metatarsal. Soft tissue swelling and gas collection anterior to the second metatarsal. IMPRESSION: 1. Large gas collection in the soft tissues of the amputated foot, distal to the second metatarsal, presumably related to open/dehiscent wound 2. Second through fifth transmetatarsal amputation with interval resection of the distal portion of the second metatarsal with smooth appearance of the cut edge. 3. Loss of the distal aspect of the third metatarsal with slightly irregular cut edge margin, not sure if this is related to interval resection versus bone destruction related to osteomyelitis. Electronically Signed   By: Jasmine PangKim  Fujinaga [name]   On: 05/29/2017 18:07   Dg Foot Complete Right  Result Date: 05/13/2017 CLINICAL DATA:  Foul-smelling discharge from top of foot for 1 week, history diabetes mellitus EXAM: RIGHT FOOT COMPLETE - 3+ VIEW COMPARISON:  10/27/2016 FINDINGS: Prior transmetatarsal amputation of second through fifth rays with amputation of the entirety of the first metatarsal. Distal soft tissue swelling with soft tissue irregularity and gas at the distal stump. Bones demineralized. No acute fracture or dislocation. Question new cortical loss at the volar aspect of the stump of the second metatarsal concerning for acute osteomyelitis, new since prior exam. IMPRESSION: Distal soft tissue swelling and gas with suspected new bone destruction at the distal aspect of the second metatarsal stump, concerning for acute osteomyelitis. Electronically Signed   By: Ulyses SouthwardMark  Boles [name]   On: 05/13/2017 18:19      Vanetta MuldersZackowski, Jalin Alicea, MD 05/29/17 1858  Addendum:  As stated above discussed with orthopedics who felt  patient was okay for home.  There is some concerns that there may be a developing infection.  But they feel comfortable with him going home at this time.  Patient nontoxic.  Blood sugars not well controlled.  But it is baseline for him.  Part of the reason why this is not healing well.  Close follow-up with orthopedics will be important.  Patient given precautions to return for any worsening of the foot.    Vanetta MuldersZackowski, Ceola Para, MD 05/29/17 2008

## 2017-05-29 NOTE — ED Notes (Signed)
Bed: WA27 Expected date:  Expected time:  Means of arrival:  Comments: EMS-behavioral-[employer]

## 2017-06-03 ENCOUNTER — Telehealth (INDEPENDENT_AMBULATORY_CARE_PROVIDER_SITE_OTHER): Payer: Self-pay | Admitting: Orthopedic Surgery

## 2017-06-03 LAB — CULTURE, BLOOD (ROUTINE X 2)
Culture: NO GROWTH
Culture: NO GROWTH
SPECIAL REQUESTS: ADEQUATE
Special Requests: ADEQUATE

## 2017-06-03 NOTE — Telephone Encounter (Signed)
Let's defer to one of them next week.  The positive UDS is concerning.  Thanks.

## 2017-06-03 NOTE — Telephone Encounter (Signed)
Patient requests RX refill on Percocet. CB [phone]

## 2017-06-04 NOTE — Telephone Encounter (Signed)
Defer to Dr. Lajoyce Cornersuda or Denny PeonErin during patients appointment this upcoming Monday 06/08/17.

## 2017-06-04 NOTE — Telephone Encounter (Signed)
There is no voicemail available to advise patient he will have to wait until Monday at his appointment to discuss pain medication with Dr. Lajoyce Cornersuda or Denny PeonErin in the office.

## 2017-06-08 ENCOUNTER — Ambulatory Visit (INDEPENDENT_AMBULATORY_CARE_PROVIDER_SITE_OTHER): Payer: PPO | Admitting: Family

## 2017-06-10 ENCOUNTER — Encounter (HOSPITAL_BASED_OUTPATIENT_CLINIC_OR_DEPARTMENT_OTHER): Payer: PPO

## 2017-06-12 ENCOUNTER — Ambulatory Visit (INDEPENDENT_AMBULATORY_CARE_PROVIDER_SITE_OTHER): Payer: PPO | Admitting: Family

## 2017-06-16 ENCOUNTER — Encounter (INDEPENDENT_AMBULATORY_CARE_PROVIDER_SITE_OTHER): Payer: Self-pay | Admitting: Orthopedic Surgery

## 2017-06-16 ENCOUNTER — Ambulatory Visit (INDEPENDENT_AMBULATORY_CARE_PROVIDER_SITE_OTHER): Payer: PPO | Admitting: Orthopedic Surgery

## 2017-06-16 ENCOUNTER — Telehealth (INDEPENDENT_AMBULATORY_CARE_PROVIDER_SITE_OTHER): Payer: Self-pay | Admitting: Orthopedic Surgery

## 2017-06-16 VITALS — Ht 73.0 in | Wt 265.0 lb

## 2017-06-16 DIAGNOSIS — E1142 Type 2 diabetes mellitus with diabetic polyneuropathy: Secondary | ICD-10-CM

## 2017-06-16 DIAGNOSIS — Z89431 Acquired absence of right foot: Secondary | ICD-10-CM

## 2017-06-16 DIAGNOSIS — M86271 Subacute osteomyelitis, right ankle and foot: Secondary | ICD-10-CM

## 2017-06-16 MED ORDER — DOXYCYCLINE HYCLATE 100 MG PO TABS
100.0000 mg | ORAL_TABLET | Freq: Two times a day (BID) | ORAL | 0 refills | Status: DC
Start: 2017-06-16 — End: 2017-06-17

## 2017-06-16 NOTE — Telephone Encounter (Signed)
I called patient.  He is aware of surgery date on Friday Jan 11th @ 11am, and [facility] [employer].  He is also aware of his post op appointment on Jan 24th @ 9:45.

## 2017-06-16 NOTE — Progress Notes (Signed)
Office Visit Note   Patient: [name]           Date of Birth: Sep 25, 1975           MRN: [id number] Visit Date: 06/16/2017              Requested by: Darrow Bussing, MD [address] [place], [place] [address] PCP: Darrow Bussing, MD  Chief Complaint  Patient presents with  . Right Foot - Routine Post Op    05/15/17 revision right transmet. Follow up from ER visit 05/29/17 possible osteo/gangrene       HPI: Patient presents 1 month status post right transmetatarsal amputation revision.  Patient complains of swelling odor and pain and drainage.  Patient states he went to the emergency room on 05/29/2017 radiographs were positive for osteomyelitis and gangrenous changes.  Patient's blood work showed a blood sugar of 304+ for cocaine positive for benzodiazepines.  Patient is currently ambulating with a Darco shoe and a cane.  He is not on antibiotics.  Assessment & Plan: Visit Diagnoses:  1. Subacute osteomyelitis of right foot (HCC)   2. Status post transmetatarsal amputation of foot, right (HCC)   3. Diabetic polyneuropathy associated with type 2 diabetes mellitus (HCC)     Plan: Will start doxycycline we will plan for a right transtibial amputation on Friday.  Risks and benefits were discussed including postoperative course.  Patient states he understands wished to proceed at this time.  Follow-Up Instructions: Return in about 2 weeks (around 06/30/2017).   Ortho Exam  Patient is alert, oriented, no adenopathy, well-dressed, normal affect, normal respiratory effort. Examination patient is a foul-smelling odor with drainage from the right transmetatarsal amputation.  The wound is gaping open there is significant amount of swelling there is tenderness to palpation the ulcer probes approximately 3 cm deep down to bone.  Imaging: No results found. No images are attached to the encounter.  Labs: Lab Results  Component Value Date   HGBA1C 14.2 (H)  05/13/2017   HGBA1C 6.4 (H) 05/28/2015   HGBA1C 13.6 (H) 09/28/2014   ESRSEDRATE 51 (H) 05/13/2017   ESRSEDRATE 53 (H) 07/04/2016   ESRSEDRATE 34 (H) 02/27/2016   CRP 2.3 (H) 05/13/2017   CRP 0.9 07/04/2016   CRP 1.8 (H) 02/27/2016   REPTSTATUS 06/03/2017 FINAL 05/29/2017   GRAMSTAIN  07/22/2010    FEW WBC PRESENT,BOTH PMN AND MONONUCLEAR RARE SQUAMOUS EPITHELIAL CELLS PRESENT RARE GRAM POSITIVE COCCI IN PAIRS   GRAMSTAIN  07/22/2010    FEW WBC PRESENT,BOTH PMN AND MONONUCLEAR RARE SQUAMOUS EPITHELIAL CELLS PRESENT RARE GRAM POSITIVE COCCI IN PAIRS   CULT  05/29/2017    NO GROWTH 5 DAYS Performed at [facility], [address], [place], [place] [address]    LABORGA STAPHYLOCOCCUS AUREUS 07/22/2010    @LABSALLVALUES (HGBA1)@  Body mass index is 34.96 kg/m.  Orders:  No orders of the defined types were placed in this encounter.  No orders of the defined types were placed in this encounter.    Procedures: No procedures performed  Clinical Data: No additional findings.  ROS:  All other systems negative, except as noted in the HPI. Review of Systems  Objective: Vital Signs: Ht 6\' 1"  (1.854 m)   Wt 265 lb (120.2 kg)   BMI 34.96 kg/m   Specialty Comments:  No specialty comments available.  PMFS History: Patient Active Problem List   Diagnosis Date Noted  . Osteomyelitis (HCC) 05/13/2017  . Acute osteomyelitis (HCC)   .  Diabetic polyneuropathy associated with type 2 diabetes mellitus (HCC) 07/10/2016  . Status post transmetatarsal amputation of foot, right (HCC) 07/10/2016  . Acquired contracture of Achilles tendon, right   . Acquired absence of right foot (HCC) 05/14/2016  . Polysubstance abuse (HCC) 03/08/2016  . Tobacco abuse 02/27/2016  . Gastroparesis 05/28/2015  . GERD (gastroesophageal reflux disease) 10/01/2014  . Right foot infection 10/01/2014  . Esophageal reflux   . Fever 09/27/2014  . Abdominal pain, lower 09/27/2014  . Abnormal ECG  05/30/2014  . Chest pain 05/30/2014  . Ankle pain 05/30/2014  . Pain in the chest   . Nausea 08/30/2013  . Epigastric abdominal pain 08/30/2013  . Low grade fever 08/30/2013  . Diabetic foot ulcer with osteomyelitis (HCC) 05/12/2013  . Diabetic osteomyelitis b/l toes 03/23/2013  . DM (diabetes mellitus) type II uncontrolled, periph vascular disorder (HCC) 03/23/2013  . CKD (chronic kidney disease), stage III (HCC) 03/23/2013  . Anemia 03/23/2013  . Benign essential HTN 03/23/2013  . AKI (acute kidney injury) (HCC) 03/22/2013  . Viral gastroenteritis 09/17/2012  . Nausea & vomiting 09/16/2012  . Acute pancreatitis 09/16/2012  . Diabetes mellitus (HCC) 09/20/2010  . Onychomycosis 09/20/2010  . METHICILLIN SUSCEPTIBLE STAPH AUREUS SEPTICEMIA 07/30/2010   Past Medical History:  Diagnosis Date  . Acute osteomyelitis, ankle and foot 07/30/2010   Qualifier: Diagnosis of  By: Daiva Eves MD, Remi Haggard    . Anemia   . Chronic kidney disease (CKD), stage III (moderate) (HCC)   . Collagen vascular disease (HCC)   . DDD (degenerative disc disease), lumbar   . Dehiscence of amputation stump (HCC)     dehiscence right transmetetarsal amputation achilles contracture  . Diabetic foot ulcer (HCC) 05/14/2017  . Gastroparesis   . GERD (gastroesophageal reflux disease)   . Headache   . Hiatal hernia   . Hyperlipidemia   . Hypertension   . Pancreatitis   . Peripheral vascular disease (HCC)   . Polysubstance abuse (HCC) 03/08/2016  . Renal insufficiency   . Type II diabetes mellitus (HCC) dx'd ~ 1996  . Vascular disease    poor circulation to left foot    Family History  Problem Relation Age of Onset  . Heart attack Father 73  . Hypertension Sister     Past Surgical History:  Procedure Laterality Date  . AMPUTATION  04/25/2011   Procedure: AMPUTATION DIGIT;  Surgeon: Nadara Mustard, MD;  Location: [facility] OR;  Service: Orthopedics;  Laterality: Left;  Left foot 3rd toe amputation MTP joint,  Gastroc Recession  Achilles Lengthening   . AMPUTATION Bilateral 03/25/2013   Procedure: AMPUTATION RAY;  Surgeon: Nadara Mustard, MD;  Location: [facility] OR;  Service: Orthopedics;  Laterality: Bilateral;  Left Great Toe Amputation at  MTP Joint, Right 1st and 2nd Ray Amputation   . AMPUTATION Right 10/03/2014   Procedure: AMPUTATION MIDFOOT;  Surgeon: Nadara Mustard, MD;  Location: [facility] OR;  Service: Orthopedics;  Laterality: Right;  . LAPAROSCOPIC CHOLECYSTECTOMY    . STUMP REVISION Right 05/23/2016   Procedure: Revision Right Transmetatarsal Amputation, Right Gastrocnemius Recession;  Surgeon: Nadara Mustard, MD;  Location: [facility] OR;  Service: Orthopedics;  Laterality: Right;  . STUMP REVISION Right 05/15/2017   Procedure: REVISION RIGHT TRANSMETATARSAL AMPUTATION;  Surgeon: Nadara Mustard, MD;  Location[facility] OR;  Service: Orthopedics;  Laterality: Right;  . TOE AMPUTATION  2012   left foot; great toe and second toe   Social History   Occupational History  .  Not on file  Tobacco Use  . Smoking status: Former Smoker    Packs/day: 0.10    Years: 4.00    Pack years: 0.40    Types: Cigarettes    Last attempt to quit: 06/10/2015    Years since quitting: 2.0  . Smokeless tobacco: Never Used  Substance and Sexual Activity  . Alcohol use: No  . Drug use: Yes    Frequency: 10.0 times per week    Types: Marijuana  . Sexual activity: Yes    Birth control/protection: None

## 2017-06-17 ENCOUNTER — Telehealth (INDEPENDENT_AMBULATORY_CARE_PROVIDER_SITE_OTHER): Payer: Self-pay | Admitting: Orthopedic Surgery

## 2017-06-17 ENCOUNTER — Other Ambulatory Visit (INDEPENDENT_AMBULATORY_CARE_PROVIDER_SITE_OTHER): Payer: Self-pay | Admitting: Family

## 2017-06-17 MED ORDER — OXYCODONE-ACETAMINOPHEN 5-325 MG PO TABS: 1.0000 | ORAL_TABLET | Freq: Three times a day (TID) | ORAL | 0 refills | Status: DC | PRN

## 2017-06-17 MED ORDER — DOXYCYCLINE HYCLATE 100 MG PO TABS: 100.0000 mg | ORAL_TABLET | Freq: Two times a day (BID) | ORAL | 0 refills | Status: DC

## 2017-06-17 NOTE — Telephone Encounter (Signed)
Patient is requesting RX refill on Percocet. He also decided not to have the surgery on Friday. If you need to call patient, his # [phone]

## 2017-06-17 NOTE — Telephone Encounter (Signed)
Advised patient rx for antibiotic resent to pharmacy, rx for percocet at front desk, controlled substance we cannot call or fax in. Advised that Dr. Lajoyce Cornersuda did want to see him next week since surgery is being cancelled to keep a close eye on foot, to make sure infection has not spread.

## 2017-06-17 NOTE — Telephone Encounter (Signed)
Per Dr. Lajoyce Cornersuda we can refill Percocet, but he also needs Rx for doxycycline.  We will make appointment for him to follow up next week. Can you do this Rxs? Thank you!

## 2017-06-17 NOTE — Telephone Encounter (Signed)
Advised patient rx for antibiotic resent to pharmacy, rx for percocet at front desk, controlled substance we cannot call or fax in. Advised that Dr. Duda did want to see him next week since surgery is being cancelled to keep a close eye on foot, to make sure infection has not spread.  

## 2017-06-19 ENCOUNTER — Inpatient Hospital Stay (HOSPITAL_COMMUNITY): Admission: RE | Admit: 2017-06-19 | Payer: PPO | Source: Ambulatory Visit | Admitting: Orthopedic Surgery

## 2017-06-19 ENCOUNTER — Encounter (HOSPITAL_COMMUNITY): Admission: RE | Payer: Self-pay | Source: Ambulatory Visit

## 2017-06-19 SURGERY — AMPUTATION BELOW KNEE
Anesthesia: General | Laterality: Right

## 2017-06-23 ENCOUNTER — Ambulatory Visit (INDEPENDENT_AMBULATORY_CARE_PROVIDER_SITE_OTHER): Payer: PPO | Admitting: Orthopedic Surgery

## 2017-06-23 ENCOUNTER — Encounter (INDEPENDENT_AMBULATORY_CARE_PROVIDER_SITE_OTHER): Payer: Self-pay | Admitting: Orthopedic Surgery

## 2017-06-23 DIAGNOSIS — E1142 Type 2 diabetes mellitus with diabetic polyneuropathy: Secondary | ICD-10-CM

## 2017-06-23 DIAGNOSIS — Z89431 Acquired absence of right foot: Secondary | ICD-10-CM

## 2017-06-23 DIAGNOSIS — M86271 Subacute osteomyelitis, right ankle and foot: Secondary | ICD-10-CM

## 2017-06-23 NOTE — Progress Notes (Signed)
Office Visit Note   Patient: [name]           Date of Birth: Nov 02, 1975           MRN: [id number] Visit Date: 06/23/2017              Requested by: Darrow Bussing, MD [address] [place], [place] [address] PCP: Darrow Bussing, MD  Chief Complaint  Patient presents with  . Right Foot - Follow-up      HPI: Patient is a 42 year old gentleman who presents follow-up status post transmetatarsal amputation right foot for limb salvage intervention.  Patient has diabetic insensate neuropathy he states that while his sugars used to run in the 300s-400s he states that his sugars are now down to 130.  Patient states that he has had no fever or chills.  He states he still has persistent foul-smelling purulent drainage.  Patient canceled his previously scheduled transtibial amputation.  Assessment & Plan: Visit Diagnoses:  1. Subacute osteomyelitis of right foot (HCC)   2. Status post transmetatarsal amputation of foot, right (HCC)   3. Diabetic polyneuropathy associated with type 2 diabetes mellitus (HCC)     Plan: Discussed recommendation to proceed with a transtibial amputation.  Discussed that he could become septic from this foot infection.  Patient states that he is not ready to consider surgery at this time.  We will plan to follow-up in 2 weeks.  Discussed that if his sugars become out of control or he has a fever chills or has any redness going up his leg that this would be an emergency to proceed with surgical intervention of a transtibial amputation.  Patient states he understands he states that he is just not ready to proceed with surgery at this time.  Examination patient's  Follow-Up Instructions: Return in about 2 weeks (around 07/07/2017).   Ortho Exam  Patient is alert, oriented, no adenopathy, well-dressed, normal affect, normal respiratory effort. Wound is well approximated except for the mid aspect of the wound and its 2 cm wide 3 mm in diameter and  probes 3 cm deep down to the metatarsals.  Patient has a palpable dorsalis pedis pulse he does have venous stasis swelling there is a foul-smelling draining purulence.  Imaging: No results found. No images are attached to the encounter.  Labs: Lab Results  Component Value Date   HGBA1C 14.2 (H) 05/13/2017   HGBA1C 6.4 (H) 05/28/2015   HGBA1C 13.6 (H) 09/28/2014   ESRSEDRATE 51 (H) 05/13/2017   ESRSEDRATE 53 (H) 07/04/2016   ESRSEDRATE 34 (H) 02/27/2016   CRP 2.3 (H) 05/13/2017   CRP 0.9 07/04/2016   CRP 1.8 (H) 02/27/2016   REPTSTATUS 06/03/2017 FINAL 05/29/2017   GRAMSTAIN  07/22/2010    FEW WBC PRESENT,BOTH PMN AND MONONUCLEAR RARE SQUAMOUS EPITHELIAL CELLS PRESENT RARE GRAM POSITIVE COCCI IN PAIRS   GRAMSTAIN  07/22/2010    FEW WBC PRESENT,BOTH PMN AND MONONUCLEAR RARE SQUAMOUS EPITHELIAL CELLS PRESENT RARE GRAM POSITIVE COCCI IN PAIRS   CULT  05/29/2017    NO GROWTH 5 DAYS Performed at [facility], [address] [place], [place] [address]    LABORGA STAPHYLOCOCCUS AUREUS 07/22/2010    @LABSALLVALUES (HGBA1)@  There is no height or weight on file to calculate BMI.  Orders:  No orders of the defined types were placed in this encounter.  No orders of the defined types were placed in this encounter.    Procedures: No procedures performed  Clinical Data:  No additional findings.  ROS:  All other systems negative, except as noted in the HPI. Review of Systems  Objective: Vital Signs: There were no vitals taken for this visit.  Specialty Comments:  No specialty comments available.  PMFS History: Patient Active Problem List   Diagnosis Date Noted  . Osteomyelitis (HCC) 05/13/2017  . Acute osteomyelitis (HCC)   . Diabetic polyneuropathy associated with type 2 diabetes mellitus (HCC) 07/10/2016  . Status post transmetatarsal amputation of foot, right (HCC) 07/10/2016  . Acquired contracture of Achilles tendon, right   . Acquired absence of right  foot (HCC) 05/14/2016  . Polysubstance abuse (HCC) 03/08/2016  . Tobacco abuse 02/27/2016  . Gastroparesis 05/28/2015  . GERD (gastroesophageal reflux disease) 10/01/2014  . Right foot infection 10/01/2014  . Esophageal reflux   . Fever 09/27/2014  . Abdominal pain, lower 09/27/2014  . Abnormal ECG 05/30/2014  . Chest pain 05/30/2014  . Ankle pain 05/30/2014  . Pain in the chest   . Nausea 08/30/2013  . Epigastric abdominal pain 08/30/2013  . Low grade fever 08/30/2013  . Diabetic foot ulcer with osteomyelitis (HCC) 05/12/2013  . Diabetic osteomyelitis b/l toes 03/23/2013  . DM (diabetes mellitus) type II uncontrolled, periph vascular disorder (HCC) 03/23/2013  . CKD (chronic kidney disease), stage III (HCC) 03/23/2013  . Anemia 03/23/2013  . Benign essential HTN 03/23/2013  . AKI (acute kidney injury) (HCC) 03/22/2013  . Viral gastroenteritis 09/17/2012  . Nausea & vomiting 09/16/2012  . Acute pancreatitis 09/16/2012  . Diabetes mellitus (HCC) 09/20/2010  . Onychomycosis 09/20/2010  . METHICILLIN SUSCEPTIBLE STAPH AUREUS SEPTICEMIA 07/30/2010   Past Medical History:  Diagnosis Date  . Acute osteomyelitis, ankle and foot 07/30/2010   Qualifier: Diagnosis of  By: Daiva EvesVan Dam MD, Remi Haggardornelius    . Anemia   . Chronic kidney disease (CKD), stage III (moderate) (HCC)   . Collagen vascular disease (HCC)   . DDD (degenerative disc disease), lumbar   . Dehiscence of amputation stump (HCC)     dehiscence right transmetetarsal amputation achilles contracture  . Diabetic foot ulcer (HCC) 05/14/2017  . Gastroparesis   . GERD (gastroesophageal reflux disease)   . Headache   . Hiatal hernia   . Hyperlipidemia   . Hypertension   . Pancreatitis   . Peripheral vascular disease (HCC)   . Polysubstance abuse (HCC) 03/08/2016  . Renal insufficiency   . Type II diabetes mellitus (HCC) dx'd ~ 1996  . Vascular disease    poor circulation to left foot    Family History  Problem Relation Age of  Onset  . Heart attack Father [id number]  . Hypertension Sister     Past Surgical History:  Procedure Laterality Date  . AMPUTATION  04/25/2011   Procedure: AMPUTATION DIGIT;  Surgeon: Nadara MustardMarcus V Adriel Kessen, MD;  [facility];  Service: Orthopedics;  Laterality: Left;  Left foot 3rd toe amputation MTP joint, Gastroc Recession  Achilles Lengthening   . AMPUTATION Bilateral 03/25/2013   Procedure: AMPUTATION RAY;  Surgeon: Nadara MustardMarcus V Brittney Caraway, MD;  Location: [facility] OR;  Service: Orthopedics;  Laterality: Bilateral;  Left Great Toe Amputation at  MTP Joint, Right 1st and 2nd Ray Amputation   . AMPUTATION Right 10/03/2014   Procedure: AMPUTATION MIDFOOT;  Surgeon: Nadara MustardMarcus Ameer Sanden V, MD;  [facility];  Service: Orthopedics;  Laterality: Right;  . LAPAROSCOPIC CHOLECYSTECTOMY    . STUMP REVISION Right 05/23/2016   Procedure: Revision Right Transmetatarsal Amputation, Right Gastrocnemius Recession;  Surgeon: Nadara MustardMarcus V Darnice Comrie, MD;  Location: [facility] OR;  Service: Orthopedics;  Laterality: Right;  . STUMP REVISION Right 05/15/2017   Procedure: REVISION RIGHT TRANSMETATARSAL AMPUTATION;  Surgeon: Nadara Mustard, MD;  Location: [facility] OR;  Service: Orthopedics;  Laterality: Right;  . TOE AMPUTATION  2012   left foot; great toe and second toe   Social History   Occupational History  . Not on file  Tobacco Use  . Smoking status: Former Smoker    Packs/day: 0.10    Years: 4.00    Pack years: 0.40    Types: Cigarettes    Last attempt to quit: 06/10/2015    Years since quitting: 2.0  . Smokeless tobacco: Never Used  Substance and Sexual Activity  . Alcohol use: No  . Drug use: Yes    Frequency: 10.0 times per week    Types: Marijuana  . Sexual activity: Yes    Birth control/protection: None

## 2017-07-02 ENCOUNTER — Inpatient Hospital Stay (INDEPENDENT_AMBULATORY_CARE_PROVIDER_SITE_OTHER): Payer: PPO | Admitting: Orthopedic Surgery

## 2017-07-07 ENCOUNTER — Ambulatory Visit (INDEPENDENT_AMBULATORY_CARE_PROVIDER_SITE_OTHER): Payer: PPO | Admitting: Orthopedic Surgery

## 2017-07-10 DIAGNOSIS — Z89511 Acquired absence of right leg below knee: Secondary | ICD-10-CM

## 2017-07-10 HISTORY — DX: Acquired absence of right leg below knee: Z89.511

## 2017-07-12 ENCOUNTER — Other Ambulatory Visit: Payer: Self-pay

## 2017-07-12 ENCOUNTER — Inpatient Hospital Stay (HOSPITAL_COMMUNITY)
Admission: EM | Admit: 2017-07-12 | Discharge: 2017-07-17 | DRG: 617 | Disposition: A | Discharge status: Home Health | Payer: PPO | Attending: Family Medicine | Admitting: Family Medicine

## 2017-07-12 ENCOUNTER — Emergency Department (HOSPITAL_COMMUNITY): Payer: PPO

## 2017-07-12 ENCOUNTER — Encounter (HOSPITAL_COMMUNITY): Payer: Self-pay

## 2017-07-12 DIAGNOSIS — I129 Hypertensive chronic kidney disease with stage 1 through stage 4 chronic kidney disease, or unspecified chronic kidney disease: Secondary | ICD-10-CM | POA: Diagnosis present

## 2017-07-12 DIAGNOSIS — N183 Chronic kidney disease, stage 3 unspecified: Secondary | ICD-10-CM | POA: Diagnosis present

## 2017-07-12 DIAGNOSIS — Z8249 Family history of ischemic heart disease and other diseases of the circulatory system: Secondary | ICD-10-CM

## 2017-07-12 DIAGNOSIS — I10 Essential (primary) hypertension: Secondary | ICD-10-CM | POA: Diagnosis present

## 2017-07-12 DIAGNOSIS — E1129 Type 2 diabetes mellitus with other diabetic kidney complication: Secondary | ICD-10-CM

## 2017-07-12 DIAGNOSIS — M869 Osteomyelitis, unspecified: Secondary | ICD-10-CM

## 2017-07-12 DIAGNOSIS — T8130XA Disruption of wound, unspecified, initial encounter: Secondary | ICD-10-CM | POA: Diagnosis not present

## 2017-07-12 DIAGNOSIS — M86671 Other chronic osteomyelitis, right ankle and foot: Secondary | ICD-10-CM | POA: Diagnosis not present

## 2017-07-12 DIAGNOSIS — M86271 Subacute osteomyelitis, right ankle and foot: Secondary | ICD-10-CM | POA: Diagnosis not present

## 2017-07-12 DIAGNOSIS — E1165 Type 2 diabetes mellitus with hyperglycemia: Secondary | ICD-10-CM | POA: Diagnosis not present

## 2017-07-12 DIAGNOSIS — E1122 Type 2 diabetes mellitus with diabetic chronic kidney disease: Secondary | ICD-10-CM | POA: Diagnosis not present

## 2017-07-12 DIAGNOSIS — Z8614 Personal history of Methicillin resistant Staphylococcus aureus infection: Secondary | ICD-10-CM

## 2017-07-12 DIAGNOSIS — K3184 Gastroparesis: Secondary | ICD-10-CM | POA: Diagnosis present

## 2017-07-12 DIAGNOSIS — G8918 Other acute postprocedural pain: Secondary | ICD-10-CM

## 2017-07-12 DIAGNOSIS — Z9114 Patient's other noncompliance with medication regimen: Secondary | ICD-10-CM

## 2017-07-12 DIAGNOSIS — F1910 Other psychoactive substance abuse, uncomplicated: Secondary | ICD-10-CM | POA: Diagnosis not present

## 2017-07-12 DIAGNOSIS — D649 Anemia, unspecified: Secondary | ICD-10-CM | POA: Diagnosis not present

## 2017-07-12 DIAGNOSIS — Z01811 Encounter for preprocedural respiratory examination: Secondary | ICD-10-CM

## 2017-07-12 DIAGNOSIS — Z794 Long term (current) use of insulin: Secondary | ICD-10-CM | POA: Diagnosis not present

## 2017-07-12 DIAGNOSIS — R1012 Left upper quadrant pain: Secondary | ICD-10-CM

## 2017-07-12 DIAGNOSIS — I739 Peripheral vascular disease, unspecified: Secondary | ICD-10-CM

## 2017-07-12 DIAGNOSIS — L97519 Non-pressure chronic ulcer of other part of right foot with unspecified severity: Secondary | ICD-10-CM | POA: Diagnosis not present

## 2017-07-12 DIAGNOSIS — R809 Proteinuria, unspecified: Secondary | ICD-10-CM

## 2017-07-12 DIAGNOSIS — E876 Hypokalemia: Secondary | ICD-10-CM | POA: Diagnosis not present

## 2017-07-12 DIAGNOSIS — E1143 Type 2 diabetes mellitus with diabetic autonomic (poly)neuropathy: Secondary | ICD-10-CM | POA: Diagnosis present

## 2017-07-12 DIAGNOSIS — L97509 Non-pressure chronic ulcer of other part of unspecified foot with unspecified severity: Secondary | ICD-10-CM | POA: Diagnosis present

## 2017-07-12 DIAGNOSIS — S88111D Complete traumatic amputation at level between knee and ankle, right lower leg, subsequent encounter: Secondary | ICD-10-CM

## 2017-07-12 DIAGNOSIS — Z01818 Encounter for other preprocedural examination: Secondary | ICD-10-CM | POA: Diagnosis not present

## 2017-07-12 DIAGNOSIS — E1169 Type 2 diabetes mellitus with other specified complication: Secondary | ICD-10-CM | POA: Diagnosis not present

## 2017-07-12 DIAGNOSIS — E1151 Type 2 diabetes mellitus with diabetic peripheral angiopathy without gangrene: Secondary | ICD-10-CM | POA: Diagnosis not present

## 2017-07-12 DIAGNOSIS — E118 Type 2 diabetes mellitus with unspecified complications: Secondary | ICD-10-CM

## 2017-07-12 DIAGNOSIS — D631 Anemia in chronic kidney disease: Secondary | ICD-10-CM | POA: Diagnosis present

## 2017-07-12 DIAGNOSIS — M86171 Other acute osteomyelitis, right ankle and foot: Secondary | ICD-10-CM | POA: Diagnosis not present

## 2017-07-12 DIAGNOSIS — R112 Nausea with vomiting, unspecified: Secondary | ICD-10-CM | POA: Diagnosis not present

## 2017-07-12 DIAGNOSIS — K219 Gastro-esophageal reflux disease without esophagitis: Secondary | ICD-10-CM | POA: Diagnosis not present

## 2017-07-12 DIAGNOSIS — E11621 Type 2 diabetes mellitus with foot ulcer: Secondary | ICD-10-CM | POA: Diagnosis present

## 2017-07-12 DIAGNOSIS — E875 Hyperkalemia: Secondary | ICD-10-CM | POA: Diagnosis not present

## 2017-07-12 DIAGNOSIS — L03115 Cellulitis of right lower limb: Secondary | ICD-10-CM | POA: Diagnosis not present

## 2017-07-12 DIAGNOSIS — Z87891 Personal history of nicotine dependence: Secondary | ICD-10-CM | POA: Diagnosis not present

## 2017-07-12 DIAGNOSIS — Z9049 Acquired absence of other specified parts of digestive tract: Secondary | ICD-10-CM | POA: Diagnosis not present

## 2017-07-12 DIAGNOSIS — E785 Hyperlipidemia, unspecified: Secondary | ICD-10-CM | POA: Diagnosis not present

## 2017-07-12 DIAGNOSIS — E1142 Type 2 diabetes mellitus with diabetic polyneuropathy: Secondary | ICD-10-CM | POA: Diagnosis not present

## 2017-07-12 DIAGNOSIS — E119 Type 2 diabetes mellitus without complications: Secondary | ICD-10-CM

## 2017-07-12 DIAGNOSIS — M8610 Other acute osteomyelitis, unspecified site: Secondary | ICD-10-CM | POA: Diagnosis not present

## 2017-07-12 DIAGNOSIS — D62 Acute posthemorrhagic anemia: Secondary | ICD-10-CM

## 2017-07-12 DIAGNOSIS — R1030 Lower abdominal pain, unspecified: Secondary | ICD-10-CM | POA: Diagnosis present

## 2017-07-12 DIAGNOSIS — M79671 Pain in right foot: Secondary | ICD-10-CM | POA: Diagnosis present

## 2017-07-12 DIAGNOSIS — E138 Other specified diabetes mellitus with unspecified complications: Secondary | ICD-10-CM | POA: Diagnosis not present

## 2017-07-12 DIAGNOSIS — R52 Pain, unspecified: Secondary | ICD-10-CM

## 2017-07-12 DIAGNOSIS — M8668 Other chronic osteomyelitis, other site: Secondary | ICD-10-CM | POA: Diagnosis not present

## 2017-07-12 LAB — CBC WITH DIFFERENTIAL/PLATELET
BASOS ABS: 0 10*3/uL (ref 0.0–0.1)
Basophils Relative: 0 %
Eosinophils Absolute: 0.1 10*3/uL (ref 0.0–0.7)
Eosinophils Relative: 1 %
HCT: 37.8 % — ABNORMAL LOW (ref 39.0–52.0)
HEMOGLOBIN: 12.5 g/dL — AB (ref 13.0–17.0)
LYMPHS ABS: 1.4 10*3/uL (ref 0.7–4.0)
LYMPHS PCT: 14 %
MCH: 29.6 pg (ref 26.0–34.0)
MCHC: 33.1 g/dL (ref 30.0–36.0)
MCV: 89.6 fL (ref 78.0–100.0)
Monocytes Absolute: 0.4 10*3/uL (ref 0.1–1.0)
Monocytes Relative: 4 %
NEUTROS PCT: 81 %
Neutro Abs: 7.8 10*3/uL — ABNORMAL HIGH (ref 1.7–7.7)
Platelets: 242 10*3/uL (ref 150–400)
RBC: 4.22 MIL/uL (ref 4.22–5.81)
RDW: 13.7 % (ref 11.5–15.5)
WBC: 9.6 10*3/uL (ref 4.0–10.5)

## 2017-07-12 LAB — URINALYSIS, ROUTINE W REFLEX MICROSCOPIC
BILIRUBIN URINE: NEGATIVE
Bacteria, UA: NONE SEEN
KETONES UR: 5 mg/dL — AB
LEUKOCYTES UA: NEGATIVE
NITRITE: NEGATIVE
PROTEIN: 100 mg/dL — AB
Specific Gravity, Urine: 1.023 (ref 1.005–1.030)
Squamous Epithelial / LPF: NONE SEEN
pH: 5 (ref 5.0–8.0)

## 2017-07-12 LAB — COMPREHENSIVE METABOLIC PANEL
ALT: 17 U/L (ref 17–63)
ANION GAP: 15 (ref 5–15)
AST: 20 U/L (ref 15–41)
Albumin: 3.5 g/dL (ref 3.5–5.0)
Alkaline Phosphatase: 104 U/L (ref 38–126)
BUN: 18 mg/dL (ref 6–20)
CHLORIDE: 95 mmol/L — AB (ref 101–111)
CO2: 23 mmol/L (ref 22–32)
Calcium: 9.5 mg/dL (ref 8.9–10.3)
Creatinine, Ser: 1.94 mg/dL — ABNORMAL HIGH (ref 0.61–1.24)
GFR calc non Af Amer: 42 mL/min — ABNORMAL LOW (ref >60)
GFR, EST AFRICAN AMERICAN: 48 mL/min — AB (ref >60)
Glucose, Bld: 408 mg/dL — ABNORMAL HIGH (ref 65–99)
POTASSIUM: 4.1 mmol/L (ref 3.5–5.1)
Sodium: 133 mmol/L — ABNORMAL LOW (ref 135–145)
Total Bilirubin: 0.8 mg/dL (ref 0.3–1.2)
Total Protein: 8.4 g/dL — ABNORMAL HIGH (ref 6.5–8.1)

## 2017-07-12 LAB — I-STAT CG4 LACTIC ACID, ED: LACTIC ACID, VENOUS: 1.57 mmol/L (ref 0.5–1.9)

## 2017-07-12 LAB — LIPASE, BLOOD: LIPASE: 25 U/L (ref 11–51)

## 2017-07-12 LAB — C-REACTIVE PROTEIN: CRP: 0.9 mg/dL (ref <1.0)

## 2017-07-12 LAB — SEDIMENTATION RATE: Sed Rate: 57 mm/hr — ABNORMAL HIGH (ref 0–16)

## 2017-07-12 LAB — CREATININE, SERUM
Creatinine, Ser: 1.67 mg/dL — ABNORMAL HIGH (ref 0.61–1.24)
GFR calc Af Amer: 58 mL/min — ABNORMAL LOW (ref >60)
GFR calc non Af Amer: 50 mL/min — ABNORMAL LOW (ref >60)

## 2017-07-12 LAB — GLUCOSE, CAPILLARY: Glucose-Capillary: 224 mg/dL — ABNORMAL HIGH (ref 65–99)

## 2017-07-12 LAB — CBG MONITORING, ED: GLUCOSE-CAPILLARY: 256 mg/dL — AB (ref 65–99)

## 2017-07-12 LAB — HEMOGLOBIN A1C
Hgb A1c MFr Bld: 10.7 % — ABNORMAL HIGH (ref 4.8–5.6)
Mean Plasma Glucose: 260.39 mg/dL

## 2017-07-12 MED ORDER — ONDANSETRON HCL 4 MG/2ML IJ SOLN
4.0000 mg | Freq: Four times a day (QID) | INTRAMUSCULAR | Status: DC | PRN
  Administered 2017-07-13 – 2017-07-15 (×5): 4 mg via INTRAVENOUS
  Filled 2017-07-12 (×5): qty 2

## 2017-07-12 MED ORDER — SODIUM CHLORIDE 0.9 % IV BOLUS (SEPSIS)
500.0000 mL | Freq: Once | INTRAVENOUS | Status: AC
  Administered 2017-07-12: 500 mL via INTRAVENOUS

## 2017-07-12 MED ORDER — SODIUM CHLORIDE 0.9 % IV SOLN
INTRAVENOUS | Status: AC
  Administered 2017-07-12: 20:00:00 via INTRAVENOUS

## 2017-07-12 MED ORDER — SODIUM CHLORIDE 0.9 % IV BOLUS (SEPSIS)
2000.0000 mL | Freq: Once | INTRAVENOUS | Status: AC
  Administered 2017-07-12: 2000 mL via INTRAVENOUS

## 2017-07-12 MED ORDER — ATORVASTATIN CALCIUM 10 MG PO TABS
10.0000 mg | ORAL_TABLET | Freq: Every day | ORAL | Status: DC
  Administered 2017-07-13 – 2017-07-16 (×4): 10 mg via ORAL
  Filled 2017-07-12 (×4): qty 1

## 2017-07-12 MED ORDER — SENNOSIDES-DOCUSATE SODIUM 8.6-50 MG PO TABS
1.0000 | ORAL_TABLET | Freq: Every evening | ORAL | Status: DC | PRN
  Administered 2017-07-16: 1 via ORAL
  Filled 2017-07-12: qty 1

## 2017-07-12 MED ORDER — INSULIN ASPART 100 UNIT/ML ~~LOC~~ SOLN: 0.0000 [IU] | Freq: Three times a day (TID) | SUBCUTANEOUS | Status: DC

## 2017-07-12 MED ORDER — ONDANSETRON HCL 4 MG/2ML IJ SOLN
4.0000 mg | Freq: Once | INTRAMUSCULAR | Status: AC
  Administered 2017-07-12: 4 mg via INTRAVENOUS
  Filled 2017-07-12: qty 2

## 2017-07-12 MED ORDER — PANTOPRAZOLE SODIUM 40 MG PO TBEC
40.0000 mg | DELAYED_RELEASE_TABLET | Freq: Two times a day (BID) | ORAL | Status: DC
  Administered 2017-07-12 – 2017-07-17 (×10): 40 mg via ORAL
  Filled 2017-07-12 (×10): qty 1

## 2017-07-12 MED ORDER — GABAPENTIN 100 MG PO CAPS
200.0000 mg | ORAL_CAPSULE | Freq: Three times a day (TID) | ORAL | Status: DC | PRN
  Administered 2017-07-13 – 2017-07-16 (×5): 200 mg via ORAL
  Filled 2017-07-12 (×5): qty 2

## 2017-07-12 MED ORDER — ACETAMINOPHEN 325 MG PO TABS
650.0000 mg | ORAL_TABLET | Freq: Four times a day (QID) | ORAL | Status: DC | PRN
  Administered 2017-07-12 – 2017-07-13 (×2): 650 mg via ORAL
  Filled 2017-07-12 (×2): qty 2

## 2017-07-12 MED ORDER — HEPARIN SODIUM (PORCINE) 5000 UNIT/ML IJ SOLN
5000.0000 [IU] | Freq: Three times a day (TID) | INTRAMUSCULAR | Status: AC
  Administered 2017-07-12 – 2017-07-13 (×2): 5000 [IU] via SUBCUTANEOUS
  Filled 2017-07-12 (×2): qty 1

## 2017-07-12 MED ORDER — ONDANSETRON HCL 4 MG/2ML IJ SOLN
4.0000 mg | Freq: Four times a day (QID) | INTRAMUSCULAR | Status: AC
  Administered 2017-07-12 – 2017-07-13 (×2): 4 mg via INTRAVENOUS
  Filled 2017-07-12 (×2): qty 2

## 2017-07-12 MED ORDER — SODIUM CHLORIDE 0.9 % IV BOLUS (SEPSIS)
1000.0000 mL | Freq: Once | INTRAVENOUS | Status: AC
  Administered 2017-07-12: 1000 mL via INTRAVENOUS

## 2017-07-12 MED ORDER — ONDANSETRON HCL 4 MG PO TABS: 4.0000 mg | ORAL_TABLET | Freq: Four times a day (QID) | ORAL | Status: DC | PRN

## 2017-07-12 MED ORDER — INSULIN ASPART 100 UNIT/ML ~~LOC~~ SOLN
0.0000 [IU] | Freq: Three times a day (TID) | SUBCUTANEOUS | Status: DC
  Administered 2017-07-13 (×2): 3 [IU] via SUBCUTANEOUS
  Administered 2017-07-13: 4 [IU] via SUBCUTANEOUS
  Administered 2017-07-14 – 2017-07-15 (×2): 3 [IU] via SUBCUTANEOUS
  Administered 2017-07-16: 4 [IU] via SUBCUTANEOUS
  Administered 2017-07-16: 3 [IU] via SUBCUTANEOUS
  Administered 2017-07-16: 4 [IU] via SUBCUTANEOUS
  Administered 2017-07-17: 3 [IU] via SUBCUTANEOUS

## 2017-07-12 MED ORDER — INSULIN GLARGINE 100 UNIT/ML ~~LOC~~ SOLN
30.0000 [IU] | Freq: Every day | SUBCUTANEOUS | Status: DC
  Administered 2017-07-12: 30 [IU] via SUBCUTANEOUS
  Filled 2017-07-12 (×2): qty 0.3

## 2017-07-12 MED ORDER — ACETAMINOPHEN 650 MG RE SUPP: 650.0000 mg | Freq: Four times a day (QID) | RECTAL | Status: DC | PRN

## 2017-07-12 MED ORDER — LISINOPRIL 10 MG PO TABS
10.0000 mg | ORAL_TABLET | Freq: Every day | ORAL | Status: DC
Start: 2017-07-13 — End: 2017-07-17
  Administered 2017-07-13 – 2017-07-17 (×5): 10 mg via ORAL
  Filled 2017-07-12 (×5): qty 1

## 2017-07-12 MED ORDER — PIPERACILLIN-TAZOBACTAM 3.375 G IVPB 30 MIN
3.3750 g | Freq: Once | INTRAVENOUS | Status: AC
  Administered 2017-07-12: 3.375 g via INTRAVENOUS
  Filled 2017-07-12: qty 50

## 2017-07-12 MED ORDER — VANCOMYCIN HCL IN DEXTROSE 1-5 GM/200ML-% IV SOLN: 1000.0000 mg | Freq: Once | INTRAVENOUS | Status: DC

## 2017-07-12 MED ORDER — KETOROLAC TROMETHAMINE 15 MG/ML IJ SOLN
15.0000 mg | Freq: Four times a day (QID) | INTRAMUSCULAR | Status: DC | PRN
  Administered 2017-07-12 – 2017-07-13 (×3): 15 mg via INTRAVENOUS
  Filled 2017-07-12 (×3): qty 1

## 2017-07-12 MED ORDER — OXYCODONE-ACETAMINOPHEN 5-325 MG PO TABS: 1.0000 | ORAL_TABLET | Freq: Three times a day (TID) | ORAL | Status: DC | PRN

## 2017-07-12 MED ORDER — VANCOMYCIN HCL 10 G IV SOLR
2000.0000 mg | Freq: Once | INTRAVENOUS | Status: AC
  Administered 2017-07-12: 2000 mg via INTRAVENOUS
  Filled 2017-07-12: qty 2000

## 2017-07-12 MED ORDER — MORPHINE SULFATE (PF) 4 MG/ML IV SOLN
4.0000 mg | Freq: Once | INTRAVENOUS | Status: AC
  Administered 2017-07-12: 4 mg via INTRAVENOUS
  Filled 2017-07-12: qty 1

## 2017-07-12 MED ORDER — TRAZODONE HCL 50 MG PO TABS: 25.0000 mg | ORAL_TABLET | Freq: Every evening | ORAL | Status: DC | PRN

## 2017-07-12 MED ORDER — PROMETHAZINE HCL 25 MG/ML IJ SOLN
25.0000 mg | Freq: Once | INTRAMUSCULAR | Status: AC
  Administered 2017-07-12: 25 mg via INTRAVENOUS
  Filled 2017-07-12: qty 1

## 2017-07-12 MED ORDER — PIPERACILLIN-TAZOBACTAM 3.375 G IVPB
3.3750 g | Freq: Three times a day (TID) | INTRAVENOUS | Status: DC
  Administered 2017-07-13 – 2017-07-16 (×10): 3.375 g via INTRAVENOUS
  Filled 2017-07-12 (×12): qty 50

## 2017-07-12 MED ORDER — HYDROCODONE-ACETAMINOPHEN 5-325 MG PO TABS
1.0000 | ORAL_TABLET | ORAL | Status: DC | PRN
  Administered 2017-07-13: 2 via ORAL
  Administered 2017-07-13: 1 via ORAL
  Administered 2017-07-13 – 2017-07-16 (×9): 2 via ORAL
  Filled 2017-07-12 (×13): qty 2

## 2017-07-12 MED ORDER — METHOCARBAMOL 500 MG PO TABS: 500.0000 mg | ORAL_TABLET | Freq: Four times a day (QID) | ORAL | Status: DC | PRN

## 2017-07-12 MED ORDER — INSULIN ASPART 100 UNIT/ML ~~LOC~~ SOLN
0.0000 [IU] | Freq: Every day | SUBCUTANEOUS | Status: DC
  Administered 2017-07-12: 2 [IU] via SUBCUTANEOUS

## 2017-07-12 MED ORDER — VANCOMYCIN HCL 10 G IV SOLR
1500.0000 mg | INTRAVENOUS | Status: DC
  Administered 2017-07-13 – 2017-07-15 (×2): 1500 mg via INTRAVENOUS
  Filled 2017-07-12 (×4): qty 1500

## 2017-07-12 MED ORDER — AMLODIPINE BESYLATE 2.5 MG PO TABS
2.5000 mg | ORAL_TABLET | Freq: Every day | ORAL | Status: DC
  Administered 2017-07-13 – 2017-07-15 (×3): 2.5 mg via ORAL
  Filled 2017-07-12 (×3): qty 1

## 2017-07-12 NOTE — ED Notes (Signed)
Patient asked for emesis bag.  Provided and delays explained.  Made aware of rooming

## 2017-07-12 NOTE — H&P (Signed)
History and Physical    Donathan Buller PXT:[id number] DOB: Feb 21, 1976 DOA: 07/12/2017  PCP: Lujean Amel, MD Patient coming from: home  Chief Complaint: right foot pain and persistent nausea and vomiting  HPI: [name] is a 42 y.o. male with medical history significant for chronic kidney disease stage III, diabetes uncontrolled, hypertension, hyperlipidemia, peripheral vascular disease status post multiple amputations bilateral feet, osteomyelitis of foot/ankle on the left and right foot with recent revision of amputation to right midfoot in December since emergency department with worsening pain in his right foot as well as persistent nausea and vomiting. Initial evaluation reveals osteomyelitis and uncontrolled diabetes with anion gap 15. Provider consulted with orthopedics who recommended medical admission and they will consult.  Information is obtained from the patient and his wife is at the bedside. He reports ongoing treatment for nonhealing diabetic foot wound status post amputation in spite of being on doxycycline. He states the last couple of days the pain is increased. Associated symptoms include purulent drainage erythema and warmth. Chart review indicates he was scheduled for transtibial amputation of the right leg in January but he canceled it. He also complains persistent ongoing nausea and vomiting. He denies coffee ground emesis. Reports "greenish" emesis. Associated symptoms include intermittent abdominal pain describes as cramp-like. He denies diarrhea constipation. He does endorse chills subjective fevers as well. Been unable to eat or drink for the last 2 days has not been taken his insulin.  ED Course: In emergency department he's afebrile hemodynamically stable with a blood pressure high-end of normal. He is provided with 500 mL of normal saline initial doses of vancomycin and Zosyn.  Review of Systems: As per HPI otherwise all other systems reviewed and are negative.    Ambulatory Status: Ambulates independently is independent with ADLs  Past Medical History:  Diagnosis Date  . Acute osteomyelitis, ankle and foot 07/30/2010   Qualifier: Diagnosis of  By: Tommy Medal MD, Roderic Scarce    . Anemia   . Chronic kidney disease (CKD), stage III (moderate) (HCC)   . Collagen vascular disease ([place])   . DDD (degenerative disc disease), lumbar   . Dehiscence of amputation stump (HCC)     dehiscence right transmetetarsal amputation achilles contracture  . Diabetic foot ulcer ([place]) 05/14/2017  . Gastroparesis   . GERD (gastroesophageal reflux disease)   . Headache   . Hiatal hernia   . Hyperlipidemia   . Hypertension   . Pancreatitis   . Peripheral vascular disease ([place])   . Polysubstance abuse ([place]) 03/08/2016  . Renal insufficiency   . Type II diabetes mellitus ([place]) dx'd ~ 1996  . Vascular disease    poor circulation to left foot    Past Surgical History:  Procedure Laterality Date  . AMPUTATION  04/25/2011   Procedure: AMPUTATION DIGIT;  Surgeon: Newt Minion, MD;  Location: [place];  Service: Orthopedics;  Laterality: Left;  Left foot 3rd toe amputation MTP joint, Gastroc Recession  Achilles Lengthening   . AMPUTATION Bilateral 03/25/2013   Procedure: AMPUTATION RAY;  Surgeon: Newt Minion, MD;  Location: [place];  Service: Orthopedics;  Laterality: Bilateral;  Left Great Toe Amputation at  MTP Joint, Right 1st and 2nd Ray Amputation   . AMPUTATION Right 10/03/2014   Procedure: AMPUTATION MIDFOOT;  Surgeon: Newt Minion, MD;  Location: [place];  Service: Orthopedics;  Laterality: Right;  . LAPAROSCOPIC CHOLECYSTECTOMY    . STUMP REVISION Right 05/23/2016   Procedure: Revision Right Transmetatarsal Amputation, Right Gastrocnemius Recession;  Surgeon: Newt Minion, MD;  Location: [place];  Service: Orthopedics;  Laterality: Right;  . STUMP REVISION Right 05/15/2017   Procedure: REVISION RIGHT TRANSMETATARSAL AMPUTATION;  Surgeon: Newt Minion, MD;  Location: [place];  Service: Orthopedics;  Laterality: Right;  . TOE AMPUTATION  2012   left foot; great toe and second toe    Social History   Socioeconomic History  . Marital status: Divorced    Spouse name: Not on file  . Number of children: Not on file  . Years of education: Not on file  . Highest education level: Not on file  Social Needs  . Financial resource strain: Not on file  . Food insecurity - worry: Not on file  . Food insecurity - inability: Not on file  . Transportation needs - medical: Not on file  . Transportation needs - non-medical: Not on file  Occupational History  . Not on file  Tobacco Use  . Smoking status: Former Smoker    Packs/day: 0.10    Years: 4.00    Pack years: 0.40    Types: Cigarettes    Last attempt to quit: 06/10/2015    Years since quitting: 2.0  . Smokeless tobacco: Never Used  Substance and Sexual Activity  . Alcohol use: No  . Drug use: Yes    Frequency: 10.0 times per week    Types: Marijuana  . Sexual activity: Yes    Birth control/protection: None  Other Topics Concern  . Not on file  Social History Narrative  . Not on file    Allergies  Allergen Reactions  . No Known Allergies     Family History  Problem Relation Age of Onset  . Heart attack Father 25  . Hypertension Sister     Prior to Admission medications   Medication Sig Start Date End Date Taking? Authorizing Provider  atorvastatin (LIPITOR) 10 MG tablet Take 10 mg by mouth daily. 03/29/17  Yes [provider]  blood glucose meter kit and supplies KIT Dispense based on patient and insurance preference. Use up to four times daily as directed. (FOR ICD-9 250.00, 250.01). 05/16/17  Yes Rai, Ripudeep K, MD  doxycycline (VIBRA-TABS) 100 MG tablet Take 1 tablet (100 mg total) by mouth 2 (two) times daily. 06/17/17  Yes Suzan Slick, NP  gabapentin (NEURONTIN) 100 MG capsule Take 2 capsules (200 mg total) by mouth 3 (three) times daily as needed (nerve pain). 05/16/17  Yes  Rai, Ripudeep K, MD  Insulin Syringes, Disposable, U-100 0.5 ML MISC Use with lantus and novolog vials. 05/16/17  Yes Rai, Ripudeep K, MD  LANTUS SOLOSTAR 100 UNIT/ML Solostar Pen Inject 30 Units into the skin at bedtime. 07/10/17  Yes [provider]  lisinopril (PRINIVIL,ZESTRIL) 10 MG tablet Take 1 tablet (10 mg total) by mouth every morning. HOLD UNTIL FOLLOW-UP WITH YOUR DOCTOR 05/16/17  Yes Rai, Ripudeep K, MD  methocarbamol (ROBAXIN) 500 MG tablet Take 1 tablet (500 mg total) by mouth every 6 (six) hours as needed for muscle spasms. 05/16/17  Yes Rai, Ripudeep K, MD  NOVOLOG FLEXPEN 100 UNIT/ML FlexPen Inject 5 Units into the skin 3 (three) times daily. With meals, per sliding scale 07/10/17  Yes [provider]  oxyCODONE-acetaminophen (PERCOCET/ROXICET) 5-325 MG tablet Take 1 tablet by mouth every 8 (eight) hours as needed for severe pain. 06/17/17  Yes Dondra Prader R, NP  insulin aspart (NOVOLOG) 100 UNIT/ML injection Inject 8 Units into the skin 3 (three) times  daily with meals. Patient not taking: Reported on 07/12/2017 05/16/17   Rai, Vernelle Emerald, MD  insulin glargine (LANTUS) 100 UNIT/ML injection Inject 0.28 mLs (28 Units total) into the skin at bedtime. Patient not taking: Reported on 07/12/2017 05/16/17   Rai, Vernelle Emerald, MD  amLODipine (NORVASC) 2.5 MG tablet Take 1 tablet (2.5 mg total) by mouth daily. Patient not taking: Reported on 01/25/2015 10/05/14 05/26/15  Regalado, Jerald Kief A, MD  pantoprazole (PROTONIX) 40 MG tablet Take 1 tablet (40 mg total) by mouth 2 (two) times daily. Patient not taking: Reported on 10/01/2014 09/30/14 05/26/15  Barton Dubois, MD    Physical Exam: Vitals:   07/12/17 1032 07/12/17 1400 07/12/17 1430  BP: (!) 147/95 113/75 (!) 168/103  Pulse: 84 84 89  Resp: 18    Temp: 98.2 F (36.8 C)    TempSrc: Oral    SpO2: 100% 94% 94%     General:  Awake alert in no acute distress well-nourished Eyes:  PERRL, EOMI, normal lids, iris ENT:  grossly  normal hearing, lips & tongue, mucous membranes of his mouth are pink but quite dry Neck:  no LAD, masses or thyromegaly Cardiovascular:  RRR, no m/r/g. Trace LE edema.  Respiratory:  CTA bilaterally, no w/r/r. Normal respiratory effort. Abdomen:  soft, ntnd, positive bowel sounds but quite sluggish. Mild diffuse tenderness to palpation no guarding or rebounding Skin:  no rash or induration seen on limited exam Musculoskeletal:  Right foot wound to distal stump status post midfoot amputation. Wound has opened with scant amount of. One drainage as well as foul odor loose sutures intact and is dry crusty tender to touch Psychiatric:  grossly normal mood and affect, speech fluent and appropriate, AOx3 Neurologic:  CN 2-12 grossly intact, moves all extremities in coordinated fashion, sensation intact  Labs on Admission: I have personally reviewed following labs and imaging studies  CBC: Recent Labs  Lab 07/12/17 1047  WBC 9.6  NEUTROABS 7.8*  HGB 12.5*  HCT 37.8*  MCV 89.6  PLT 425   Basic Metabolic Panel: Recent Labs  Lab 07/12/17 1047  NA 133*  K 4.1  CL 95*  CO2 23  GLUCOSE 408*  BUN 18  CREATININE 1.94*  CALCIUM 9.5   GFR: CrCl cannot be calculated (Unknown ideal weight.). Liver Function Tests: Recent Labs  Lab 07/12/17 1047  AST 20  ALT 17  ALKPHOS 104  BILITOT 0.8  PROT 8.4*  ALBUMIN 3.5   Recent Labs  Lab 07/12/17 1232  LIPASE 25   No results for input(s): AMMONIA in the last 168 hours. Coagulation Profile: No results for input(s): INR, PROTIME in the last 168 hours. Cardiac Enzymes: No results for input(s): CKTOTAL, CKMB, CKMBINDEX, TROPONINI in the last 168 hours. BNP (last 3 results) No results for input(s): PROBNP in the last 8760 hours. HbA1C: No results for input(s): HGBA1C in the last 72 hours. CBG: Recent Labs  Lab 07/12/17 1536  GLUCAP 256*   Lipid Profile: No results for input(s): CHOL, HDL, LDLCALC, TRIG, CHOLHDL, LDLDIRECT in the  last 72 hours. Thyroid Function Tests: No results for input(s): TSH, T4TOTAL, FREET4, T3FREE, THYROIDAB in the last 72 hours. Anemia Panel: No results for input(s): VITAMINB12, FOLATE, FERRITIN, TIBC, IRON, RETICCTPCT in the last 72 hours. Urine analysis:    Component Value Date/Time   COLORURINE STRAW (A) 07/12/2017 1233   APPEARANCEUR CLEAR 07/12/2017 1233   LABSPEC 1.023 07/12/2017 1233   PHURINE 5.0 07/12/2017 1233   GLUCOSEU >=500 (A) 07/12/2017 1233  HGBUR SMALL (A) 07/12/2017 1233   BILIRUBINUR NEGATIVE 07/12/2017 1233   KETONESUR 5 (A) 07/12/2017 1233   PROTEINUR 100 (A) 07/12/2017 1233   UROBILINOGEN 1.0 08/22/2016 1941   NITRITE NEGATIVE 07/12/2017 1233   LEUKOCYTESUR NEGATIVE 07/12/2017 1233    Creatinine Clearance: CrCl cannot be calculated (Unknown ideal weight.).  Sepsis Labs: '@LABRCNTIP'$ (procalcitonin:4,lacticidven:4) )No results found for this or any previous visit (from the past 240 hour(s)).   Radiological Exams on Admission: Dg Foot 2 Views Right  Result Date: 07/12/2017 CLINICAL DATA:  Osteomyelitis. EXAM: RIGHT FOOT - 2 VIEW COMPARISON:  05/29/2017 FINDINGS: Post transmetatarsal amputation of the right foot. Indistinct margin of the cut edge of the second and third metatarsal bones, may be ongoing osteomyelitis. No large gas collections within the soft tissues. IMPRESSION: Status post transmetatarsal amputation of the right foot with indistinct margin of the cut edge of the second and third metatarsal bones, which may represent ongoing osteomyelitis. No evidence of large soft tissue gas collections. Electronically Signed   By: Fidela Salisbury [name]   On: 07/12/2017 13:35    EKG  Assessment/Plan  Osteomyelitis Persistent nausea vomiting Diabetes uncontrolled Gastroparesis GERD Diabetic foot ulcer Chronic kidney disease -Hypertension -Anemia  #1. Osteomyelitis. Patient with a history of same status post amputation mid right foot. X-ray reveals  indistinct margin second and third metatarsal bones which may represent ongoing osteomyelitis. No evidence of soft tissue gas. Actiq acid within the limits of normal He's afebrile hemodynamically stable and nontoxic appearing. Orthopedic is consulted recommended vancomycin and Zosyn and agreed to consult -Admit to [facility] -Vancomycin per pharmacy -Zosyn per pharmacy -Supportive therapy -We will get PT/INR/chest x-ray in anticipation of surgery next week -Await further recommendations from orthopedics  #2. Persistent nausea and vomiting/gastroparesis likely related to uncontrolled diabetes. Serum glucose greater than 400 upon presentation. Recent hemoglobin A1c greater than 14. Patient admits to noncompliance with insulin. Lipase within the limits of normal. Lactic acid within the limits of normal -Scheduled Zofran -Improved glucose control -Supportive therapy  #3. Diabetes. Uncontrolled. Patient history of noncompliance. Home regimen includes Lantus and sliding scale. Recent hemoglobin A1c greater than 14. Serum glucose greater than 400 upon presentation. An ion gap 15. Was provided with a 500 mL bolus of normal saline in the emergency department. At the time of admission A blood sugar 286 -We'll provide another 5 mL of normal saline bolus -Continuous IV fluids at 75 per hour after that -Resume home Lantus -Sliding scale insulin for optimal control -Diabetes coordinator  #4. Chronic kidney disease. Creatinine 1.9 on admission. This is close to his baseline. -IV fluids as noted above -Hold nephrotoxins -Monitor urine output -Recheck in the morning  #5. Hypertension. Poor control in the emergency department. Home medications include lisinopril, amlodipine -Continue home meds -Monitor closely  #6. Anemia. Of chronic disease. Hemoglobin 12.5 on admission. This is close to baseline. No signs symptoms obvious bleeding.     DVT prophylaxis: heparin Code Status: full  Family  Communication: wife at bedside  Disposition Plan: home when ready  Consults called: [facility] orth[facility]  Admission status: inpatient    Radene Gunning MD [facility]  If 7PM-7AM, please contact night-coverage [email or web address] Password [facility]  07/12/2017, 4:47 PM

## 2017-07-12 NOTE — ED Provider Notes (Signed)
[facility] EMERGENCY DEPARTMENT Provider Note   CSN: [id number] Arrival date & time: 07/12/17  1014     History   Chief Complaint No chief complaint on file.   HPI [name] is a 42 y.o. male with a PMHx of CKD3, peripheral vascular disease, DM2, HTN, HLD, osteomyelitis of foot/ankle s/p multiple amputations of left foot and right mid-foot with recent revision of amputation to R mid-foot on 05/15/17 by Dr. Sharol Given, as well as multiple other medical conditions listed below, and additional PSHx of cholecystectomy, who presents to the ED with complaints of chills, subjective fevers, n/v, and LUQ pain x2 days, and gradually worsening R foot wound with worsening pain, erythema, warmth, and purulent drainage.  He describes his abdominal pain as 10/10 constant sharp and throbbing nonradiating LUQ pain which worsens with laying down and with no treatments tried prior to arrival.  He reports too numerous to count episodes of nonbloody nonbilious emesis.  Regarding his foot wound, he reports purulent drainage, erythema, and warmth, but denies any red streaking.  Of note, chart review reveals that he was scheduled for a transtibial amputation of the R leg in Jan 2019 however he cancelled it; he followed up with Dr. Sharol Given in the office on 06/23/17 and told him that he "wasn't ready" for surgery yet; Dr. Sharol Given reiterated to the pt that he could become septic from this wound and it could become a more emergent procedure, and again recommended proceeding with that option, however the pt continued to decline.  He reports that he's been compliant with his doxycycline 164m BID however reports that the wound continues to worsen.  He also reports that he is compliant with his Lantus 30 units at bedtime and NovoLog 8 units 3 times a day, however he admits that he has not had any insulin in 2 days.  His [place]  His foot has been managed by Dr. DSharol Given    He denies known  fevers, CP, SOB, diarrhea/constipation, obstipation, melena, hematochezia, hematemesis, hematuria, dysuria, testicular pain/swelling, penile discharge, myalgias, new/worsening numbness/tingling, focal weakness, or any other complaints at this time. Denies recent travel, sick contacts, suspicious food intake, EtOH use, or frequent NSAID use.    The history is provided by the patient and medical records. No language interpreter was used.  Abdominal Pain   This is a new problem. The current episode started 2 days ago. The problem occurs constantly. The problem has not changed since onset.The pain is associated with an unknown factor. The pain is located in the LUQ. The quality of the pain is sharp and throbbing. The pain is at a severity of 10/10. The pain is moderate. Associated symptoms include fever (subjective), nausea, vomiting and arthralgias (R foot). Pertinent negatives include diarrhea, flatus, hematochezia, melena, constipation, dysuria, hematuria and myalgias. Exacerbated by: laying down. Nothing relieves the symptoms.    Past Medical History:  Diagnosis Date  . Acute osteomyelitis, ankle and foot 07/30/2010   Qualifier: Diagnosis of  By: VTommy MedalMD, CRoderic Scarce   . Anemia   . Chronic kidney disease (CKD), stage III (moderate) (HCC)   . Collagen vascular disease ([place]   . DDD (degenerative disc disease), lumbar   . Dehiscence of amputation stump (HCC)     dehiscence right transmetetarsal amputation achilles contracture  . Diabetic foot ulcer ([place] 05/14/2017  . Gastroparesis   . GERD (gastroesophageal reflux disease)   . Headache   . Hiatal hernia   .  Hyperlipidemia   . Hypertension   . Pancreatitis   . Peripheral vascular disease ([place])   . Polysubstance abuse ([place]) 03/08/2016  . Renal insufficiency   . Type II diabetes mellitus ([place]) dx'd ~ 1996  . Vascular disease    poor circulation to left foot    Patient Active Problem List   Diagnosis Date Noted  . Osteomyelitis ([place])  05/13/2017  . Acute osteomyelitis ([place])   . Diabetic polyneuropathy associated with type 2 diabetes mellitus ([place]) 07/10/2016  . Status post transmetatarsal amputation of foot, right ([place]) 07/10/2016  . Acquired contracture of Achilles tendon, right   . Acquired absence of right foot ([place]) 05/14/2016  . Polysubstance abuse ([place]) 03/08/2016  . Tobacco abuse 02/27/2016  . Gastroparesis 05/28/2015  . GERD (gastroesophageal reflux disease) 10/01/2014  . Right foot infection 10/01/2014  . Esophageal reflux   . Fever 09/27/2014  . Abdominal pain, lower 09/27/2014  . Abnormal ECG 05/30/2014  . Chest pain 05/30/2014  . Ankle pain 05/30/2014  . Pain in the chest   . Nausea 08/30/2013  . Epigastric abdominal pain 08/30/2013  . Low grade fever 08/30/2013  . Diabetic foot ulcer with osteomyelitis ([place]) 05/12/2013  . Diabetic osteomyelitis b/l toes 03/23/2013  . DM (diabetes mellitus) type II uncontrolled, periph vascular disorder ([place]) 03/23/2013  . CKD (chronic kidney disease), stage III ([place]) 03/23/2013  . Anemia 03/23/2013  . Benign essential HTN 03/23/2013  . AKI (acute kidney injury) ([place]) 03/22/2013  . Viral gastroenteritis 09/17/2012  . Nausea & vomiting 09/16/2012  . Acute pancreatitis 09/16/2012  . Diabetes mellitus ([place]) 09/20/2010  . Onychomycosis 09/20/2010  . METHICILLIN SUSCEPTIBLE STAPH AUREUS SEPTICEMIA 07/30/2010    Past Surgical History:  Procedure Laterality Date  . AMPUTATION  04/25/2011   Procedure: AMPUTATION DIGIT;  Surgeon: Newt Minion, MD;  Location: [facility];  Service: Orthopedics;  Laterality: Left;  Left foot 3rd toe amputation MTP joint, Gastroc Recession  Achilles Lengthening   . AMPUTATION Bilateral 03/25/2013   Procedure: AMPUTATION RAY;  Surgeon: Newt Minion, MD;  Location: [facility];  Service: Orthopedics;  Laterality: Bilateral;  Left Great Toe Amputation at  MTP Joint, Right 1st and 2nd Ray Amputation   . AMPUTATION Right 10/03/2014   Procedure:  AMPUTATION MIDFOOT;  Surgeon: Newt Minion, MD;  Location: [facility];  Service: Orthopedics;  Laterality: Right;  . LAPAROSCOPIC CHOLECYSTECTOMY    . STUMP REVISION Right 05/23/2016   Procedure: Revision Right Transmetatarsal Amputation, Right Gastrocnemius Recession;  Surgeon: Newt Minion, MD;  Location: [facility];  Service: Orthopedics;  Laterality: Right;  . STUMP REVISION Right 05/15/2017   Procedure: REVISION RIGHT TRANSMETATARSAL AMPUTATION;  Surgeon: Newt Minion, MD;  Location: [facility];  Service: Orthopedics;  Laterality: Right;  . TOE AMPUTATION  2012   left foot; great toe and second toe       Home Medications    Prior to Admission medications   Medication Sig Start Date End Date Taking? Authorizing Provider  atorvastatin (LIPITOR) 10 MG tablet Take 10 mg by mouth daily. 03/29/17  Yes [provider]  blood glucose meter kit and supplies KIT Dispense based on patient and insurance preference. Use up to four times daily as directed. (FOR ICD-9 250.00, 250.01). 05/16/17  Yes Rai, Ripudeep K, MD  doxycycline (VIBRA-TABS) 100 MG tablet Take 1 tablet (100 mg total) by mouth 2 (two) times daily. 06/17/17  Yes Dondra Prader R, NP  gabapentin (NEURONTIN) 100 MG capsule Take 2 capsules (  200 mg total) by mouth 3 (three) times daily as needed (nerve pain). 05/16/17  Yes Rai, Ripudeep K, MD  Insulin Syringes, Disposable, U-100 0.5 ML MISC Use with lantus and novolog vials. 05/16/17  Yes Rai, Ripudeep K, MD  LANTUS SOLOSTAR 100 UNIT/ML Solostar Pen Inject 30 Units into the skin at bedtime. 07/10/17  Yes [provider]  lisinopril (PRINIVIL,ZESTRIL) 10 MG tablet Take 1 tablet (10 mg total) by mouth every morning. HOLD UNTIL FOLLOW-UP WITH YOUR DOCTOR 05/16/17  Yes Rai, Ripudeep K, MD  methocarbamol (ROBAXIN) 500 MG tablet Take 1 tablet (500 mg total) by mouth every 6 (six) hours as needed for muscle spasms. 05/16/17  Yes Rai, Ripudeep K, MD  NOVOLOG FLEXPEN 100 UNIT/ML FlexPen Inject 5 Units  into the skin 3 (three) times daily. With meals, per sliding scale 07/10/17  Yes [provider]  oxyCODONE-acetaminophen (PERCOCET/ROXICET) 5-325 MG tablet Take 1 tablet by mouth every 8 (eight) hours as needed for severe pain. 06/17/17  Yes Dondra Prader R, NP  insulin aspart (NOVOLOG) 100 UNIT/ML injection Inject 8 Units into the skin 3 (three) times daily with meals. Patient not taking: Reported on 07/12/2017 05/16/17   Rai, Vernelle Emerald, MD  insulin glargine (LANTUS) 100 UNIT/ML injection Inject 0.28 mLs (28 Units total) into the skin at bedtime. Patient not taking: Reported on 07/12/2017 05/16/17   Rai, Vernelle Emerald, MD  amLODipine (NORVASC) 2.5 MG tablet Take 1 tablet (2.5 mg total) by mouth daily. Patient not taking: Reported on 01/25/2015 10/05/14 05/26/15  Regalado, Jerald Kief A, MD  pantoprazole (PROTONIX) 40 MG tablet Take 1 tablet (40 mg total) by mouth 2 (two) times daily. Patient not taking: Reported on 10/01/2014 09/30/14 05/26/15  Barton Dubois, MD    Family History Family History  Problem Relation Age of Onset  . Heart attack Father 58  . Hypertension Sister     Social History Social History   Tobacco Use  . Smoking status: Former Smoker    Packs/day: 0.10    Years: 4.00    Pack years: 0.40    Types: Cigarettes    Last attempt to quit: 06/10/2015    Years since quitting: 2.0  . Smokeless tobacco: Never Used  Substance Use Topics  . Alcohol use: No  . Drug use: Yes    Frequency: 10.0 times per week    Types: Marijuana     Allergies   No known allergies   Review of Systems Review of Systems  Constitutional: Positive for chills and fever (subjective).  Respiratory: Negative for shortness of breath.   Cardiovascular: Negative for chest pain.  Gastrointestinal: Positive for abdominal pain, nausea and vomiting. Negative for blood in stool, constipation, diarrhea, flatus, hematochezia and melena.  Genitourinary: Negative for discharge, dysuria, hematuria, scrotal swelling  and testicular pain.  Musculoskeletal: Positive for arthralgias (R foot). Negative for myalgias.  Skin: Positive for color change and wound.  Allergic/Immunologic: Positive for immunocompromised state (DM2).  Neurological: Negative for weakness and numbness.  Psychiatric/Behavioral: Negative for confusion.   All other systems reviewed and are negative for acute change except as noted in the HPI.    Physical Exam Updated Vital Signs BP (!) 147/95 (BP Location: Right Arm)   Pulse 84   Temp 98.2 F (36.8 C) (Oral)   Resp 18   SpO2 100%   Physical Exam  Constitutional: He is oriented to person, place, and time. Vital signs are normal. He appears well-developed and well-nourished.  Non-toxic appearance. No distress.  Afebrile,  nontoxic, appears to feel unwell however in NAD; dry heaving during end of exam, but able to keep himself from vomiting.   HENT:  Head: Normocephalic and atraumatic.  Mouth/Throat: Oropharynx is clear and moist. Mucous membranes are dry.  Mildly dry lips  Eyes: Conjunctivae and EOM are normal. Right eye exhibits no discharge. Left eye exhibits no discharge.  Neck: Normal range of motion. Neck supple.  Cardiovascular: Normal rate, regular rhythm, normal heart sounds and intact distal pulses. Exam reveals no gallop and no friction rub.  No murmur heard. Pulmonary/Chest: Effort normal and breath sounds normal. No respiratory distress. He has no decreased breath sounds. He has no wheezes. He has no rhonchi. He has no rales.  Abdominal: Soft. Normal appearance and bowel sounds are normal. He exhibits no distension. There is tenderness in the left upper quadrant. There is no rigidity, no rebound, no guarding, no CVA tenderness, no tenderness at McBurney's point and negative Murphy's sign.  Soft, nondistended, +BS throughout, with mild LUQ TTP, no r/g/r, neg murphy's, neg mcburney's, no CVA TTP   Musculoskeletal: Normal range of motion.  R foot wound as mentioned below and  pictured below  Neurological: He is alert and oriented to person, place, and time. He has normal strength. No sensory deficit.  Skin: Skin is warm and dry. Laceration (wound) noted. No rash noted.  R foot wound to distal stump of mid-foot amputation, wound has dehisced and exposes the underlying fat, scant purulent drainage from wound, foul odor coming from wound, exposed loosened sutures noted. No erythema noted however pt is dark skinned, and the skin around the wound is hyperpigmented and crusty. Hyperpigmentation extends to mid-calf, no red streaking noted but again this is obscured by the hyperpigmentation. Moderate warmth noted to foot up to the ankle. Moderately TTP around wound. SEE PICTURE BELOW  Psychiatric: He has a normal mood and affect.  Nursing note and vitals reviewed.      ED Treatments / Results  Labs (all labs ordered are listed, but only abnormal results are displayed) Labs Reviewed  COMPREHENSIVE METABOLIC PANEL - Abnormal; Notable for the following components:      Result Value   Sodium 133 (*)    Chloride 95 (*)    Glucose, Bld 408 (*)    Creatinine, Ser 1.94 (*)    Total Protein 8.4 (*)    GFR calc non Af Amer 42 (*)    GFR calc Af Amer 48 (*)    All other components within normal limits  CBC WITH DIFFERENTIAL/PLATELET - Abnormal; Notable for the following components:   Hemoglobin 12.5 (*)    HCT 37.8 (*)    Neutro Abs 7.8 (*)    All other components within normal limits  URINALYSIS, ROUTINE W REFLEX MICROSCOPIC - Abnormal; Notable for the following components:   Color, Urine STRAW (*)    Glucose, UA >=500 (*)    Hgb urine dipstick SMALL (*)    Ketones, ur 5 (*)    Protein, ur 100 (*)    All other components within normal limits  SEDIMENTATION RATE - Abnormal; Notable for the following components:   Sed Rate 57 (*)    All other components within normal limits  CBG MONITORING, ED - Abnormal; Notable for the following components:   Glucose-Capillary 256  (*)    All other components within normal limits  LIPASE, BLOOD  C-REACTIVE PROTEIN  I-STAT CG4 LACTIC ACID, ED    EKG  EKG Interpretation None  Radiology Dg Foot 2 Views Right  Result Date: 07/12/2017 CLINICAL DATA:  Osteomyelitis. EXAM: RIGHT FOOT - 2 VIEW COMPARISON:  05/29/2017 FINDINGS: Post transmetatarsal amputation of the right foot. Indistinct margin of the cut edge of the second and third metatarsal bones, may be ongoing osteomyelitis. No large gas collections within the soft tissues. IMPRESSION: Status post transmetatarsal amputation of the right foot with indistinct margin of the cut edge of the second and third metatarsal bones, which may represent ongoing osteomyelitis. No evidence of large soft tissue gas collections. Electronically Signed   By: Fidela Salisbury [name]   On: 07/12/2017 13:35    Procedures Procedures (including critical care time)  Medications Ordered in ED Medications  piperacillin-tazobactam (ZOSYN) IVPB 3.375 g (3.375 g Intravenous New Bag/Given 07/12/17 1620)  vancomycin (VANCOCIN) 2,000 mg in sodium chloride 0.9 % 500 mL IVPB (2,000 mg Intravenous New Bag/Given 07/12/17 1620)  vancomycin (VANCOCIN) 1,500 mg in sodium chloride 0.9 % 500 mL IVPB (not administered)  sodium chloride 0.9 % bolus 2,000 mL (0 mLs Intravenous Stopped 07/12/17 1415)  ondansetron (ZOFRAN) injection 4 mg (4 mg Intravenous Given 07/12/17 1252)  morphine 4 MG/ML injection 4 mg (4 mg Intravenous Given 07/12/17 1253)  promethazine (PHENERGAN) injection 25 mg (25 mg Intravenous Given 07/12/17 1455)     Initial Impression / Assessment and Plan / ED Course  I have reviewed the triage vital signs and the nursing notes.  Pertinent labs & imaging results that were available during my care of the patient were reviewed by me and considered in my medical decision making (see chart for details).     42 y.o. male here with chills, subjective fever, n/v, and LUQ abd pain x2 days as well as  worsening R foot wound. Has known R foot wound issue which has dehisced and was recommended for transtibial amputation but pt cancelled his scheduled surgery last month. Reports compliance with doxycycline. On exam, mild LUQ TTP, nonperitoneal, afebrile and nontoxic, appears to feel unwell, dry heaving during exam, with R foot wound dehisced exposing the underlying fat, scant purulent drainage noted, foul odor from wound, no definite erythema although skin is darkened around the wound, moderately TTP, with moderate warmth extending to the ankle; no definite red streaking but again the coloration is hyperpigmented up to mid-calf. Work up thus far reveals: lactic WNL; CMP with gluc 408 without anion gap, Cr 1.94 which is near baseline; CBC w/diff without leukocytosis however mildly neutrophilic predominance noted, and with mild chronic anemia. Will add-on lipase, CRP, ESR, U/A, and get R foot xray to eval for osteomyelitis extension. Doubt need for abdominal imaging at this time, I suspect his n/v/abd pain is from hyperglycemia and/or from his foot wound becoming more problematic. Will give fluids, zofran, morphine, and reassess shortly.   2:46 PM CRP WNL. Lipase WNL. R foot xray confirms that there is indistinct margin of cut edge of 2nd-3rd metatarsal bones which may represent ongoing osteomyelitis, no large soft tissue gas collection seen. Pt feeling slightly better, nausea still ongoing; will give phenergan. Will touch base with Dr. Sharol Given to discuss whether more urgent surgical intervention would be warranted; pt now agreeable to proceed with that option. Discussed case with my attending Dr. Lita Mains who agrees with plan.  Will reassess shortly.    3:01 PM ESR elevated at 57. U/A with glucosuria and 5 ketones, 100 protein likely from mild dehydration, no evidence of infection; doubt DKA given normal anion gap and bicarb. Dr. Percell Miller paged for ortho  accidentally, advised Network engineer that he is the incorrect  physician. Will await correct orthopedist to return page.   3:43 PM Dr. Marlou Sa of ortho returning page and recommends medical admission, he will see in consult, and they'll likely proceed with amputation this week. Recommends starting abx. Will start vanc/zosyn and proceed with admission.   3:58 PM Dyanne Carrel NP of [facility] returning page and will admit. Holding orders to be placed by admitting team. Please see their notes for further documentation of care. I appreciate their help with this pleasant pt's care. Pt stable at time of admission.    Final Clinical Impressions(s) / ED Diagnoses   Final diagnoses:  Chronic osteomyelitis of right foot (HCC)  Nausea and vomiting in adult patient  Left upper quadrant pain  Type 2 diabetes mellitus with hyperglycemia, with long-term current use of insulin [facility])  Chronic anemia  Essential hypertension    ED Discharge Orders    [address] , [place], [place] 07/12/17 [address] [place], [place] 07/16/17 1314    Julianne Rice, MD 07/16/17 1531

## 2017-07-12 NOTE — Progress Notes (Addendum)
Pharmacy Antibiotic Note  [name] is a 42 y.o. male admitted on 07/12/2017 with cellulitis of right foot wound. Patient had amputation to right mid-foot on 05/15/17. Patient has been taking doxycycline 100 mg BID for the wound but now has purulent drainage from the site. Pharmacy has been consulted for vancomycin and zosyn dosing. Patient was 120 kg as of 05/29/17. SCr- 1.94 (Baseline: 1.6-1.7). CrCl ~ 60  ml/min.  Plan: Vancomycin 2000 mg X 1  Vancomycin 1500 mg every 24 hours  Zosyn 3.375 gm every 8 hours  Monitor renal function, clinical s/sx of infection F/U weight this admission      Temp (24hrs), Avg:98.2 F (36.8 C), Min:98.2 F (36.8 C), Max:98.2 F (36.8 C)  Recent Labs  Lab 07/12/17 1047 07/12/17 1121  WBC 9.6  --   CREATININE 1.94*  --   LATICACIDVEN  --  1.57    CrCl cannot be calculated (Unknown ideal weight.).    Allergies  Allergen Reactions  . No Known Allergies     Antimicrobials this admission: 2/3 Vanc>> 2/3 Zosyn >>    Thank you for allowing pharmacy to be a part of this patient's care.  Sharin MonsEmily Sinclair, PharmD, BCPS PGY2 Infectious Diseases Pharmacy Resident Pager: [phone]  07/12/2017 3:52 PM

## 2017-07-12 NOTE — ED Provider Notes (Deleted)
[facility] EMERGENCY DEPARTMENT Provider Note   CSN: [id number] Arrival date & time: 07/12/17  1014     History   Chief Complaint No chief complaint on file.   HPI [name] is a 42 y.o. male with a PMHx of CKD3, peripheral vascular disease, DM2, HTN, HLD, osteomyelitis of foot/ankle s/p multiple amputations of left foot and right mid-foot with recent revision of amputation to R mid-foot on 05/15/17 by Dr. Sharol Given, as well as multiple other medical conditions listed below, and additional PSHx of cholecystectomy, who presents to the ED with complaints of chills, subjective fevers, n/v, and LUQ pain x2 days, and gradually worsening R foot wound with worsening pain, erythema, warmth, and purulent drainage.  He describes his abdominal pain as 10/10 constant sharp and throbbing nonradiating LUQ pain which worsens with laying down and with no treatments tried prior to arrival.  He reports too numerous to count episodes of nonbloody nonbilious emesis.  Regarding his foot wound, he reports purulent drainage, erythema, and warmth, but denies any red streaking.  Of note, chart review reveals that he was scheduled for a transtibial amputation of the R leg in Jan 2019 however he cancelled it; he followed up with Dr. Sharol Given in the office on 06/23/17 and told him that he "wasn't ready" for surgery yet; Dr. Sharol Given reiterated to the pt that he could become septic from this wound and it could become a more emergent procedure, and again recommended proceeding with that option, however the pt continued to decline.  He reports that he's been compliant with his doxycycline 164m BID however reports that the wound continues to worsen.  He also reports that he is compliant with his Lantus 30 units at bedtime and NovoLog 8 units 3 times a day, however he admits that he has not had any insulin in 2 days.  His [place]  His foot has been managed by Dr. DSharol Given    He denies known  fevers, CP, SOB, diarrhea/constipation, obstipation, melena, hematochezia, hematemesis, hematuria, dysuria, testicular pain/swelling, penile discharge, myalgias, new/worsening numbness/tingling, focal weakness, or any other complaints at this time. Denies recent travel, sick contacts, suspicious food intake, EtOH use, or frequent NSAID use.    The history is provided by the patient and medical records. No language interpreter was used.  Abdominal Pain   This is a new problem. The current episode started 2 days ago. The problem occurs constantly. The problem has not changed since onset.The pain is associated with an unknown factor. The pain is located in the LUQ. The quality of the pain is sharp and throbbing. The pain is at a severity of 10/10. The pain is moderate. Associated symptoms include fever (subjective), nausea, vomiting and arthralgias (R foot). Pertinent negatives include diarrhea, flatus, hematochezia, melena, constipation, dysuria, hematuria and myalgias. Exacerbated by: laying down. Nothing relieves the symptoms.    Past Medical History:  Diagnosis Date  . Acute osteomyelitis, ankle and foot 07/30/2010   Qualifier: Diagnosis of  By: VTommy MedalMD, CRoderic Scarce   . Anemia   . Chronic kidney disease (CKD), stage III (moderate) (HCC)   . Collagen vascular disease ([place]   . DDD (degenerative disc disease), lumbar   . Dehiscence of amputation stump (HCC)     dehiscence right transmetetarsal amputation achilles contracture  . Diabetic foot ulcer ([place] 05/14/2017  . Gastroparesis   . GERD (gastroesophageal reflux disease)   . Headache   . Hiatal hernia   .  Hyperlipidemia   . Hypertension   . Pancreatitis   . Peripheral vascular disease ([place])   . Polysubstance abuse ([place]) 03/08/2016  . Renal insufficiency   . Type II diabetes mellitus ([place]) dx'd ~ 1996  . Vascular disease    poor circulation to left foot    Patient Active Problem List   Diagnosis Date Noted  . Osteomyelitis ([place])  05/13/2017  . Acute osteomyelitis ([place])   . Diabetic polyneuropathy associated with type 2 diabetes mellitus ([place]) 07/10/2016  . Status post transmetatarsal amputation of foot, right ([place]) 07/10/2016  . Acquired contracture of Achilles tendon, right   . Acquired absence of right foot ([place]) 05/14/2016  . Polysubstance abuse ([place]) 03/08/2016  . Tobacco abuse 02/27/2016  . Gastroparesis 05/28/2015  . GERD (gastroesophageal reflux disease) 10/01/2014  . Right foot infection 10/01/2014  . Esophageal reflux   . Fever 09/27/2014  . Abdominal pain, lower 09/27/2014  . Abnormal ECG 05/30/2014  . Chest pain 05/30/2014  . Ankle pain 05/30/2014  . Pain in the chest   . Nausea 08/30/2013  . Epigastric abdominal pain 08/30/2013  . Low grade fever 08/30/2013  . Diabetic foot ulcer with osteomyelitis ([place]) 05/12/2013  . Diabetic osteomyelitis b/l toes 03/23/2013  . DM (diabetes mellitus) type II uncontrolled, periph vascular disorder ([place]) 03/23/2013  . CKD (chronic kidney disease), stage III ([place]) 03/23/2013  . Anemia 03/23/2013  . Benign essential HTN 03/23/2013  . AKI (acute kidney injury) ([place]) 03/22/2013  . Viral gastroenteritis 09/17/2012  . Nausea & vomiting 09/16/2012  . Acute pancreatitis 09/16/2012  . Diabetes mellitus ([place]) 09/20/2010  . Onychomycosis 09/20/2010  . METHICILLIN SUSCEPTIBLE STAPH AUREUS SEPTICEMIA 07/30/2010    Past Surgical History:  Procedure Laterality Date  . AMPUTATION  04/25/2011   Procedure: AMPUTATION DIGIT;  Surgeon: Newt Minion, MD;  Location: [facility];  Service: Orthopedics;  Laterality: Left;  Left foot 3rd toe amputation MTP joint, Gastroc Recession  Achilles Lengthening   . AMPUTATION Bilateral 03/25/2013   Procedure: AMPUTATION RAY;  Surgeon: Newt Minion, MD;  Location: [facility];  Service: Orthopedics;  Laterality: Bilateral;  Left Great Toe Amputation at  MTP Joint, Right 1st and 2nd Ray Amputation   . AMPUTATION Right 10/03/2014   Procedure:  AMPUTATION MIDFOOT;  Surgeon: Newt Minion, MD;  Location: [facility];  Service: Orthopedics;  Laterality: Right;  . LAPAROSCOPIC CHOLECYSTECTOMY    . STUMP REVISION Right 05/23/2016   Procedure: Revision Right Transmetatarsal Amputation, Right Gastrocnemius Recession;  Surgeon: Newt Minion, MD;  Location: [facility];  Service: Orthopedics;  Laterality: Right;  . STUMP REVISION Right 05/15/2017   Procedure: REVISION RIGHT TRANSMETATARSAL AMPUTATION;  Surgeon: Newt Minion, MD;  Location: [facility];  Service: Orthopedics;  Laterality: Right;  . TOE AMPUTATION  2012   left foot; great toe and second toe       Home Medications    Prior to Admission medications   Medication Sig Start Date End Date Taking? Authorizing Provider  atorvastatin (LIPITOR) 10 MG tablet Take 10 mg by mouth daily. 03/29/17   [provider]  blood glucose meter kit and supplies KIT Dispense based on patient and insurance preference. Use up to four times daily as directed. (FOR ICD-9 250.00, 250.01). 05/16/17   Rai, Vernelle Emerald, MD  doxycycline (VIBRA-TABS) 100 MG tablet Take 1 tablet (100 mg total) by mouth 2 (two) times daily. 06/17/17   Suzan Slick, NP  gabapentin (NEURONTIN) 100 MG capsule Take 2 capsules (  200 mg total) by mouth 3 (three) times daily as needed (nerve pain). Patient not taking: Reported on 06/16/2017 05/16/17   Rai, Vernelle Emerald, MD  insulin aspart (NOVOLOG) 100 UNIT/ML injection Inject 8 Units into the skin 3 (three) times daily with meals. 05/16/17   Rai, Ripudeep K, MD  insulin glargine (LANTUS) 100 UNIT/ML injection Inject 0.28 mLs (28 Units total) into the skin at bedtime. Patient taking differently: Inject 30 Units into the skin at bedtime.  05/16/17   Rai, Ripudeep Raliegh Ip, MD  Insulin Syringes, Disposable, U-100 0.5 ML MISC Use with lantus and novolog vials. 05/16/17   Rai, Ripudeep K, MD  lisinopril (PRINIVIL,ZESTRIL) 10 MG tablet Take 1 tablet (10 mg total) by mouth every morning. HOLD UNTIL FOLLOW-UP WITH  YOUR DOCTOR 05/16/17   Rai, Vernelle Emerald, MD  methocarbamol (ROBAXIN) 500 MG tablet Take 1 tablet (500 mg total) by mouth every 6 (six) hours as needed for muscle spasms. 05/16/17   Rai, Vernelle Emerald, MD  oxyCODONE-acetaminophen (PERCOCET/ROXICET) 5-325 MG tablet Take 1 tablet by mouth every 8 (eight) hours as needed for severe pain. 06/17/17   Suzan Slick, NP  amLODipine (NORVASC) 2.5 MG tablet Take 1 tablet (2.5 mg total) by mouth daily. Patient not taking: Reported on 01/25/2015 10/05/14 05/26/15  Regalado, Jerald Kief A, MD  pantoprazole (PROTONIX) 40 MG tablet Take 1 tablet (40 mg total) by mouth 2 (two) times daily. Patient not taking: Reported on 10/01/2014 09/30/14 05/26/15  Barton Dubois, MD    Family History Family History  Problem Relation Age of Onset  . Heart attack Father 28  . Hypertension Sister     Social History Social History   Tobacco Use  . Smoking status: Former Smoker    Packs/day: 0.10    Years: 4.00    Pack years: 0.40    Types: Cigarettes    Last attempt to quit: 06/10/2015    Years since quitting: 2.0  . Smokeless tobacco: Never Used  Substance Use Topics  . Alcohol use: No  . Drug use: Yes    Frequency: 10.0 times per week    Types: Marijuana     Allergies   No known allergies   Review of Systems Review of Systems  Constitutional: Positive for chills and fever (subjective).  Respiratory: Negative for shortness of breath.   Cardiovascular: Negative for chest pain.  Gastrointestinal: Positive for abdominal pain, nausea and vomiting. Negative for blood in stool, constipation, diarrhea, flatus, hematochezia and melena.  Genitourinary: Negative for discharge, dysuria, hematuria, scrotal swelling and testicular pain.  Musculoskeletal: Positive for arthralgias (R foot). Negative for myalgias.  Skin: Positive for color change and wound.  Allergic/Immunologic: Positive for immunocompromised state (DM2).  Neurological: Negative for weakness and numbness.    Psychiatric/Behavioral: Negative for confusion.   All other systems reviewed and are negative for acute change except as noted in the HPI.    Physical Exam Updated Vital Signs BP (!) 147/95 (BP Location: Right Arm)   Pulse 84   Temp 98.2 F (36.8 C) (Oral)   Resp 18   SpO2 100%   Physical Exam  Constitutional: He is oriented to person, place, and time. Vital signs are normal. He appears well-developed and well-nourished.  Non-toxic appearance. No distress.  Afebrile, nontoxic, appears to feel unwell however in NAD; dry heaving during end of exam, but able to keep himself from vomiting.   HENT:  Head: Normocephalic and atraumatic.  Mouth/Throat: Oropharynx is clear and moist. Mucous membranes are dry.  Mildly dry lips  Eyes: Conjunctivae and EOM are normal. Right eye exhibits no discharge. Left eye exhibits no discharge.  Neck: Normal range of motion. Neck supple.  Cardiovascular: Normal rate, regular rhythm, normal heart sounds and intact distal pulses. Exam reveals no gallop and no friction rub.  No murmur heard. Pulmonary/Chest: Effort normal and breath sounds normal. No respiratory distress. He has no decreased breath sounds. He has no wheezes. He has no rhonchi. He has no rales.  Abdominal: Soft. Normal appearance and bowel sounds are normal. He exhibits no distension. There is tenderness in the left upper quadrant. There is no rigidity, no rebound, no guarding, no CVA tenderness, no tenderness at McBurney's point and negative Murphy's sign.  Soft, nondistended, +BS throughout, with mild LUQ TTP, no r/g/r, neg murphy's, neg mcburney's, no CVA TTP   Musculoskeletal: Normal range of motion.  R foot wound as mentioned below and pictured below  Neurological: He is alert and oriented to person, place, and time. He has normal strength. No sensory deficit.  Skin: Skin is warm and dry. Laceration (wound) noted. No rash noted.  R foot wound to distal stump of mid-foot amputation, wound  has dehisced and exposes the underlying fat, scant purulent drainage from wound, foul odor coming from wound, exposed loosened sutures noted. No erythema noted however pt is dark skinned, and the skin around the wound is hyperpigmented and crusty. Hyperpigmentation extends to mid-calf, no red streaking noted but again this is obscured by the hyperpigmentation. Moderate warmth noted to foot up to the ankle. Moderately TTP around wound. SEE PICTURE BELOW  Psychiatric: He has a normal mood and affect.  Nursing note and vitals reviewed.      ED Treatments / Results  Labs (all labs ordered are listed, but only abnormal results are displayed) Labs Reviewed  COMPREHENSIVE METABOLIC PANEL - Abnormal; Notable for the following components:      Result Value   Sodium 133 (*)    Chloride 95 (*)    Glucose, Bld 408 (*)    Creatinine, Ser 1.94 (*)    Total Protein 8.4 (*)    GFR calc non Af Amer 42 (*)    GFR calc Af Amer 48 (*)    All other components within normal limits  CBC WITH DIFFERENTIAL/PLATELET - Abnormal; Notable for the following components:   Hemoglobin 12.5 (*)    HCT 37.8 (*)    Neutro Abs 7.8 (*)    All other components within normal limits  URINALYSIS, ROUTINE W REFLEX MICROSCOPIC - Abnormal; Notable for the following components:   Color, Urine STRAW (*)    Glucose, UA >=500 (*)    Hgb urine dipstick SMALL (*)    Ketones, ur 5 (*)    Protein, ur 100 (*)    All other components within normal limits  SEDIMENTATION RATE - Abnormal; Notable for the following components:   Sed Rate 57 (*)    All other components within normal limits  CBG MONITORING, ED - Abnormal; Notable for the following components:   Glucose-Capillary 256 (*)    All other components within normal limits  LIPASE, BLOOD  C-REACTIVE PROTEIN  I-STAT CG4 LACTIC ACID, ED    EKG  EKG Interpretation None       Radiology Dg Foot 2 Views Right  Result Date: 07/12/2017 CLINICAL DATA:  Osteomyelitis. EXAM:  RIGHT FOOT - 2 VIEW COMPARISON:  05/29/2017 FINDINGS: Post transmetatarsal amputation of the right foot. Indistinct margin of the cut edge of  the second and third metatarsal bones, may be ongoing osteomyelitis. No large gas collections within the soft tissues. IMPRESSION: Status post transmetatarsal amputation of the right foot with indistinct margin of the cut edge of the second and third metatarsal bones, which may represent ongoing osteomyelitis. No evidence of large soft tissue gas collections. Electronically Signed   By: Fidela Salisbury [name]   On: 07/12/2017 13:35    Procedures Procedures (including critical care time)  Medications Ordered in ED Medications  vancomycin (VANCOCIN) IVPB 1000 mg/200 mL premix (not administered)  piperacillin-tazobactam (ZOSYN) IVPB 3.375 g (not administered)  sodium chloride 0.9 % bolus 2,000 mL (0 mLs Intravenous Stopped 07/12/17 1415)  ondansetron (ZOFRAN) injection 4 mg (4 mg Intravenous Given 07/12/17 1252)  morphine 4 MG/ML injection 4 mg (4 mg Intravenous Given 07/12/17 1253)  promethazine (PHENERGAN) injection 25 mg (25 mg Intravenous Given 07/12/17 1455)     Initial Impression / Assessment and Plan / ED Course  I have reviewed the triage vital signs and the nursing notes.  Pertinent labs & imaging results that were available during my care of the patient were reviewed by me and considered in my medical decision making (see chart for details).     42 y.o. male here with chills, subjective fever, n/v, and LUQ abd pain x2 days as well as worsening R foot wound. Has known R foot wound issue which has dehisced and was recommended for transtibial amputation but pt cancelled his scheduled surgery last month. Reports compliance with doxycycline. On exam, mild LUQ TTP, nonperitoneal, afebrile and nontoxic, appears to feel unwell, dry heaving during exam, with R foot wound dehisced exposing the underlying fat, scant purulent drainage noted, foul odor from wound,  no definite erythema although skin is darkened around the wound, moderately TTP, with moderate warmth extending to the ankle; no definite red streaking but again the coloration is hyperpigmented up to mid-calf. Work up thus far reveals: lactic WNL; CMP with gluc 408 without anion gap, Cr 1.94 which is near baseline; CBC w/diff without leukocytosis however mildly neutrophilic predominance noted, and with mild chronic anemia. Will add-on lipase, CRP, ESR, U/A, and get R foot xray to eval for osteomyelitis extension. Doubt need for abdominal imaging at this time, I suspect his n/v/abd pain is from hyperglycemia and/or from his foot wound becoming more problematic. Will give fluids, zofran, morphine, and reassess shortly.   2:46 PM CRP WNL. Lipase WNL. R foot xray confirms that there is indistinct margin of cut edge of 2nd-3rd metatarsal bones which may represent ongoing osteomyelitis, no large soft tissue gas collection seen. Pt feeling slightly better, nausea still ongoing; will give phenergan. Will touch base with Dr. Sharol Given to discuss whether more urgent surgical intervention would be warranted; pt now agreeable to proceed with that option. Discussed case with my attending Dr. Lita Mains who agrees with plan.  Will reassess shortly.    3:01 PM ESR elevated at 57. U/A with glucosuria and 5 ketones, 100 protein likely from mild dehydration, no evidence of infection; doubt DKA given normal anion gap and bicarb. Dr. Percell Miller paged for ortho accidentally, advised secretary that he is the incorrect physician. Will await correct orthopedist to return page.   3:43 PM Dr. Marlou Sa of ortho returning page and recommends medical admission, he will see in consult, and they'll likely proceed with amputation this week. Recommends starting abx. Will start vanc/zosyn and proceed with admission.   3:58 PM Dyanne Carrel NP of [facility] returning page and will admit. Holding  orders to be placed by admitting team. Please see their notes for  further documentation of care. I appreciate their help with this pleasant pt's care. Pt stable at time of admission.    Final Clinical Impressions(s) / ED Diagnoses   Final diagnoses:  Chronic osteomyelitis of right foot (HCC)  Nausea and vomiting in adult patient  Left upper quadrant pain  Type 2 diabetes mellitus with hyperglycemia, with long-term current use of insulin [facility])  Chronic anemia  Essential hypertension    ED Discharge Orders    [address], [place], [place] 07/12/17 1558

## 2017-07-12 NOTE — Progress Notes (Signed)
Patient needs transtibial amputation I will notify Dr. Lajoyce Cornersuda who will see him this week once he is medically stabilized.  Anticipate surgery on Wednesday

## 2017-07-12 NOTE — ED Triage Notes (Signed)
Patient complains of ongoing right foot pain due to wound that is being treated for several weeks, reports increased drainage and pain. Taking doxy for same

## 2017-07-12 NOTE — Plan of Care (Signed)
  Nutrition: Adequate nutrition will be maintained 07/12/2017 1850 - Progressing by Darrow BussingArcilla, Rhylan Gross M, RN   Pain Managment: General experience of comfort will improve 07/12/2017 1850 - Progressing by Darrow BussingArcilla, Latina Frank M, RN   Safety: Ability to remain free from injury will improve 07/12/2017 1850 - Progressing by Darrow BussingArcilla, Saiya Crist M, RN

## 2017-07-13 ENCOUNTER — Inpatient Hospital Stay (HOSPITAL_COMMUNITY): Payer: PPO

## 2017-07-13 ENCOUNTER — Other Ambulatory Visit: Payer: Self-pay

## 2017-07-13 ENCOUNTER — Encounter (HOSPITAL_COMMUNITY): Payer: Self-pay | Admitting: General Practice

## 2017-07-13 ENCOUNTER — Other Ambulatory Visit (INDEPENDENT_AMBULATORY_CARE_PROVIDER_SITE_OTHER): Payer: Self-pay | Admitting: Family

## 2017-07-13 LAB — BASIC METABOLIC PANEL
Anion gap: 7 (ref 5–15)
BUN: 16 mg/dL (ref 6–20)
CALCIUM: 8.1 mg/dL — AB (ref 8.9–10.3)
CHLORIDE: 105 mmol/L (ref 101–111)
CO2: 25 mmol/L (ref 22–32)
CREATININE: 1.83 mg/dL — AB (ref 0.61–1.24)
GFR calc Af Amer: 52 mL/min — ABNORMAL LOW (ref >60)
GFR calc non Af Amer: 45 mL/min — ABNORMAL LOW (ref >60)
GLUCOSE: 135 mg/dL — AB (ref 65–99)
Potassium: 3.4 mmol/L — ABNORMAL LOW (ref 3.5–5.1)
Sodium: 137 mmol/L (ref 135–145)

## 2017-07-13 LAB — PROTIME-INR
INR: 1.21
PROTHROMBIN TIME: 15.2 s (ref 11.4–15.2)

## 2017-07-13 LAB — CBC
HCT: 30.3 % — ABNORMAL LOW (ref 39.0–52.0)
Hemoglobin: 9.7 g/dL — ABNORMAL LOW (ref 13.0–17.0)
MCH: 28.8 pg (ref 26.0–34.0)
MCHC: 32 g/dL (ref 30.0–36.0)
MCV: 89.9 fL (ref 78.0–100.0)
Platelets: 187 10*3/uL (ref 150–400)
RBC: 3.37 MIL/uL — ABNORMAL LOW (ref 4.22–5.81)
RDW: 13.5 % (ref 11.5–15.5)
WBC: 8.9 10*3/uL (ref 4.0–10.5)

## 2017-07-13 LAB — GLUCOSE, CAPILLARY
GLUCOSE-CAPILLARY: 149 mg/dL — AB (ref 65–99)
Glucose-Capillary: 131 mg/dL — ABNORMAL HIGH (ref 65–99)
Glucose-Capillary: 171 mg/dL — ABNORMAL HIGH (ref 65–99)
Glucose-Capillary: 87 mg/dL (ref 65–99)

## 2017-07-13 LAB — HIV ANTIBODY (ROUTINE TESTING W REFLEX): HIV Screen 4th Generation wRfx: NONREACTIVE

## 2017-07-13 LAB — APTT: aPTT: 31 seconds (ref 24–36)

## 2017-07-13 MED ORDER — POTASSIUM CHLORIDE CRYS ER 20 MEQ PO TBCR
40.0000 meq | EXTENDED_RELEASE_TABLET | Freq: Once | ORAL | Status: AC
  Administered 2017-07-13: 40 meq via ORAL
  Filled 2017-07-13: qty 2

## 2017-07-13 MED ORDER — LIVING WELL WITH DIABETES BOOK
Freq: Once | Status: AC
  Administered 2017-07-13: 09:00:00
  Filled 2017-07-13: qty 1

## 2017-07-13 MED ORDER — SODIUM CHLORIDE 0.9 % IV SOLN: INTRAVENOUS | Status: AC

## 2017-07-13 NOTE — Progress Notes (Signed)
PROGRESS NOTE   [name]  ZOX:[id number]    DOB: 01/02/1976    DOA: 07/12/2017  PCP: Darrow BussingKoirala, Dibas, MD   I have briefly reviewed patients [facility].  Brief Narrative:  42 year old male with poorly controlled DM, right transmetatarsal amputation 05/15/17 due to osteomyelitis, HTN, HLD, GERD, gastroparesis, stage III chronic kidney disease, presented to ED due to foul-smelling nonhealing right foot wound and nausea and nonbloody emesis.  Patient apparently declined transtibial amputation of right leg scheduled in January.  Admitted for suspected right foot osteomyelitis, infected wound and diabetic gastroparesis.  Orthopedics consulted and plan surgery 07/14/17.   Assessment & Plan:   Principal Problem:   Osteomyelitis (HCC) Active Problems:   Diabetes mellitus (HCC)   Nausea & vomiting   CKD (chronic kidney disease), stage III (HCC)   Anemia   Benign essential HTN   Diabetic foot ulcer with osteomyelitis (HCC)   Abdominal pain, lower   GERD (gastroesophageal reflux disease)   Gastroparesis   Acute osteomyelitis (HCC)   1. Nonhealing right foot wound/osteomyelitis: Prior trans-metatarsal amputation.  Empirically placed on IV vancomycin and Zosyn.  Orthopedics/Dr. Lajoyce Cornersuda consulted and plans surgical intervention 07/14/17. 2. Nausea and vomiting/gastroparesis: Related to poorly controlled long-standing diabetes.  A1c 2/3: 10.7.  Lipase normal.  Treat supportively with antiemetics, PPI and advance diet as tolerated.  Tolerating clear liquids. 3. Poorly controlled DM 2: A1c 10.7.  Reported noncompliance.  Presented with CBG greater than 400.  Currently reasonably controlled.  Continue current dose of Lantus and SSI.  Adjust insulins as needed.  Compliance counseled extensively. 4. Essential hypertension: Mildly uncontrolled at times.  Continue lisinopril and amlodipine. 5. Stage III chronic kidney disease: Baseline creatinine fluctuating but may be in the  1.6-1.9 range.  Stable.  Follow BMP closely. 6. Anemia: Likely combination of chronic kidney disease, chronic disease and dilutional.  To follow CBC daily. 7. Hypokalemia: Replace and follow.   DVT prophylaxis: Heparin Code Status: Full Family Communication: Discussed in detail with spouse at bedside. Disposition: DC home pending clinical improvement.   Consultants:  Orthopedics  Procedures:  None  Antimicrobials:  IV vancomycin and Zosyn.   Subjective: Feels better.  Tolerating clear liquids.  No nausea and vomiting for several hours.  No abdominal pain reported.  ROS: As above  Objective:  Vitals:   07/13/17 0407 07/13/17 0444 07/13/17 1000 07/13/17 1300  BP: 112/68  112/68 (!) 175/87  Pulse: 80   73  Resp: 16   16  Temp: 98.4 F (36.9 C)   98.6 F (37 C)  TempSrc: Oral   Oral  SpO2: 97%   100%  Weight:  117.8 kg (259 lb 11.2 oz)    Height:  6\' 1"  (1.854 m)      Examination:  General exam: Pleasant young male, moderately built and obese lying comfortably supine in bed. Respiratory system: Clear to auscultation. Respiratory effort normal. Cardiovascular system: S1 & S2 heard, RRR. No JVD, murmurs, rubs, gallops or clicks. No pedal edema. Gastrointestinal system: Abdomen is nondistended, soft and nontender. No organomegaly or masses felt. Normal bowel sounds heard. Central nervous system: Alert and oriented. No focal neurological deficits. Extremities: Symmetric 5 x 5 power.  Right transmetatarsal amputation site with open wound at incision site, foul order.  Please see picture below. Skin: No rashes, lesions or ulcers Psychiatry: Judgement and insight appear normal. Mood & affect appropriate.       Data Reviewed: I have personally reviewed following labs and  imaging studies  CBC: Recent Labs  Lab 07/12/17 1047 07/13/17 0458  WBC 9.6 8.9  NEUTROABS 7.8*  --   HGB 12.5* 9.7*  HCT 37.8* 30.3*  MCV 89.6 89.9  PLT 242 187   Basic Metabolic  Panel: Recent Labs  Lab 07/12/17 1047 07/12/17 1805 07/13/17 0458  NA 133*  --  137  K 4.1  --  3.4*  CL 95*  --  105  CO2 23  --  25  GLUCOSE 408*  --  135*  BUN 18  --  16  CREATININE 1.94* 1.67* 1.83*  CALCIUM 9.5  --  8.1*   Liver Function Tests: Recent Labs  Lab 07/12/17 1047  AST 20  ALT 17  ALKPHOS 104  BILITOT 0.8  PROT 8.4*  ALBUMIN 3.5   Coagulation Profile: Recent Labs  Lab 07/13/17 0458  INR 1.21   Cardiac Enzymes: No results for input(s): CKTOTAL, CKMB, CKMBINDEX, TROPONINI in the last 168 hours. HbA1C: Recent Labs    07/12/17 1805  HGBA1C 10.7*   CBG: Recent Labs  Lab 07/12/17 1536 07/12/17 2123 07/13/17 0601 07/13/17 1158 07/13/17 1626  GLUCAP 256* 224* 131* 171* 149*    No results found for this or any previous visit (from the past 240 hour(s)).       Radiology Studies: Portable Chest 1 View  Result Date: 07/13/2017 CLINICAL DATA:  Preoperative examination prior to foot surgery. EXAM: PORTABLE CHEST 1 VIEW COMPARISON:  Chest x-ray of May 07, 2016 FINDINGS: The lungs are adequately inflated and clear. The heart and pulmonary vascularity are normal. The mediastinum is normal in width. There is no pleural effusion. The bony thorax is unremarkable. IMPRESSION: There is no active cardiopulmonary disease. Electronically Signed   By: David  [place] [name]   On: 07/13/2017 07:19   Dg Foot 2 Views Right  Result Date: 07/12/2017 CLINICAL DATA:  Osteomyelitis. EXAM: RIGHT FOOT - 2 VIEW COMPARISON:  05/29/2017 FINDINGS: Post transmetatarsal amputation of the right foot. Indistinct margin of the cut edge of the second and third metatarsal bones, may be ongoing osteomyelitis. No large gas collections within the soft tissues. IMPRESSION: Status post transmetatarsal amputation of the right foot with indistinct margin of the cut edge of the second and third metatarsal bones, which may represent ongoing osteomyelitis. No evidence of large soft tissue gas  collections. Electronically Signed   By: Ted Mcalpine [name]   On: 07/12/2017 13:35        Scheduled Meds: . amLODipine  2.5 mg Oral Daily  . atorvastatin  10 mg Oral q1800  . insulin aspart  0-20 Units Subcutaneous TID WC  . insulin aspart  0-5 Units Subcutaneous QHS  . insulin glargine  30 Units Subcutaneous QHS  . lisinopril  10 mg Oral Daily  . pantoprazole  40 mg Oral BID   Continuous Infusions: . piperacillin-tazobactam (ZOSYN)  IV 3.375 g (07/13/17 1600)  . vancomycin 1,500 mg (07/13/17 1645)     LOS: 1 day     Marcellus Scott, MD, FACP, [facility]. [facility] Pager [phone]  If 7PM-7AM, please contact night-coverage [email or web address] Password [facility] 07/13/2017, 6:48 PM

## 2017-07-13 NOTE — Progress Notes (Signed)
Inpatient Diabetes Program Recommendations  AACE/ADA: New Consensus Statement on Inpatient Glycemic Control (2015)  Target Ranges:  Prepandial:   less than 140 mg/dL      Peak postprandial:   less than 180 mg/dL (1-2 hours)      Critically ill patients:  140 - 180 mg/dL   Lab Results  Component Value Date   GLUCAP 171 (H) 07/13/2017   HGBA1C 10.7 (H) 07/12/2017    Review of Glycemic Control  Diabetes history: DM2 Outpatient Diabetes medications: Lantus 30 units QHS, Novolog 5 units tidwc Current orders for Inpatient glycemic control: Lantus 30 units QHS, Novolog 0-20 units tidwc and hs  HgbA1C of 10.7% indicates sub-par glycemic control at home.  Spouse not in room at time of visit. Pt resting soundly. Discussed with RN. Will follow-up in am.   Thank you. [name], RD, LDN, CDE Inpatient Diabetes Coordinator [phone]

## 2017-07-14 ENCOUNTER — Inpatient Hospital Stay (HOSPITAL_COMMUNITY): Payer: PPO

## 2017-07-14 ENCOUNTER — Encounter (HOSPITAL_COMMUNITY): Payer: Self-pay | Admitting: Certified Registered"

## 2017-07-14 ENCOUNTER — Encounter (HOSPITAL_COMMUNITY)
Admission: EM | Disposition: A | Discharge status: Home Health | Payer: Self-pay | Source: Home / Self Care | Attending: Family Medicine

## 2017-07-14 DIAGNOSIS — M86671 Other chronic osteomyelitis, right ankle and foot: Secondary | ICD-10-CM

## 2017-07-14 DIAGNOSIS — E11621 Type 2 diabetes mellitus with foot ulcer: Secondary | ICD-10-CM

## 2017-07-14 DIAGNOSIS — L97509 Non-pressure chronic ulcer of other part of unspecified foot with unspecified severity: Secondary | ICD-10-CM

## 2017-07-14 DIAGNOSIS — M86271 Subacute osteomyelitis, right ankle and foot: Secondary | ICD-10-CM

## 2017-07-14 DIAGNOSIS — M869 Osteomyelitis, unspecified: Secondary | ICD-10-CM

## 2017-07-14 DIAGNOSIS — E1169 Type 2 diabetes mellitus with other specified complication: Principal | ICD-10-CM

## 2017-07-14 HISTORY — PX: AMPUTATION: SHX166

## 2017-07-14 LAB — BASIC METABOLIC PANEL
Anion gap: 12 (ref 5–15)
BUN: 12 mg/dL (ref 6–20)
CHLORIDE: 103 mmol/L (ref 101–111)
CO2: 22 mmol/L (ref 22–32)
Calcium: 8.6 mg/dL — ABNORMAL LOW (ref 8.9–10.3)
Creatinine, Ser: 1.87 mg/dL — ABNORMAL HIGH (ref 0.61–1.24)
GFR calc Af Amer: 50 mL/min — ABNORMAL LOW (ref >60)
GFR calc non Af Amer: 43 mL/min — ABNORMAL LOW (ref >60)
GLUCOSE: 126 mg/dL — AB (ref 65–99)
POTASSIUM: 3.4 mmol/L — AB (ref 3.5–5.1)
Sodium: 137 mmol/L (ref 135–145)

## 2017-07-14 LAB — CBC
HEMATOCRIT: 33 % — AB (ref 39.0–52.0)
Hemoglobin: 10.5 g/dL — ABNORMAL LOW (ref 13.0–17.0)
MCH: 28.4 pg (ref 26.0–34.0)
MCHC: 31.8 g/dL (ref 30.0–36.0)
MCV: 89.2 fL (ref 78.0–100.0)
Platelets: 205 10*3/uL (ref 150–400)
RBC: 3.7 MIL/uL — AB (ref 4.22–5.81)
RDW: 13.1 % (ref 11.5–15.5)
WBC: 7.6 10*3/uL (ref 4.0–10.5)

## 2017-07-14 LAB — GLUCOSE, CAPILLARY
GLUCOSE-CAPILLARY: 120 mg/dL — AB (ref 65–99)
GLUCOSE-CAPILLARY: 111 mg/dL — AB (ref 65–99)
Glucose-Capillary: 140 mg/dL — ABNORMAL HIGH (ref 65–99)
Glucose-Capillary: 86 mg/dL (ref 65–99)
Glucose-Capillary: 118 mg/dL — ABNORMAL HIGH (ref 65–99)

## 2017-07-14 LAB — SURGICAL PCR SCREEN
MRSA, PCR: NEGATIVE
STAPHYLOCOCCUS AUREUS: POSITIVE — AB

## 2017-07-14 SURGERY — AMPUTATION BELOW KNEE
Anesthesia: General | Site: Leg Lower | Laterality: Right

## 2017-07-14 MED ORDER — HYDROCODONE-ACETAMINOPHEN 5-325 MG PO TABS
ORAL_TABLET | ORAL | Status: AC
  Filled 2017-07-14: qty 2

## 2017-07-14 MED ORDER — SODIUM CHLORIDE 0.9 % IV SOLN: INTRAVENOUS | Status: DC

## 2017-07-14 MED ORDER — HYDROMORPHONE HCL 1 MG/ML IJ SOLN
INTRAMUSCULAR | Status: AC
  Administered 2017-07-15: 1 mg via INTRAVENOUS
  Filled 2017-07-14: qty 1

## 2017-07-14 MED ORDER — ONDANSETRON HCL 4 MG/2ML IJ SOLN
INTRAMUSCULAR | Status: DC | PRN
  Administered 2017-07-14: 4 mg via INTRAVENOUS

## 2017-07-14 MED ORDER — FENTANYL CITRATE (PF) 100 MCG/2ML IJ SOLN
INTRAMUSCULAR | Status: DC | PRN
  Administered 2017-07-14: 150 ug via INTRAVENOUS

## 2017-07-14 MED ORDER — LIDOCAINE HCL (CARDIAC) 20 MG/ML IV SOLN
INTRAVENOUS | Status: DC | PRN
  Administered 2017-07-14: 100 mg via INTRAVENOUS

## 2017-07-14 MED ORDER — DOCUSATE SODIUM 100 MG PO CAPS
100.0000 mg | ORAL_CAPSULE | Freq: Two times a day (BID) | ORAL | Status: DC
  Administered 2017-07-14 – 2017-07-17 (×6): 100 mg via ORAL
  Filled 2017-07-14 (×6): qty 1

## 2017-07-14 MED ORDER — PROPOFOL 10 MG/ML IV BOLUS
INTRAVENOUS | Status: AC
  Filled 2017-07-14: qty 20

## 2017-07-14 MED ORDER — MUPIROCIN 2 % EX OINT
1.0000 "application " | TOPICAL_OINTMENT | Freq: Two times a day (BID) | CUTANEOUS | Status: DC
  Administered 2017-07-14 – 2017-07-17 (×7): 1 via NASAL
  Filled 2017-07-14 (×2): qty 22

## 2017-07-14 MED ORDER — FENTANYL CITRATE (PF) 250 MCG/5ML IJ SOLN
INTRAMUSCULAR | Status: AC
  Filled 2017-07-14: qty 5

## 2017-07-14 MED ORDER — ACETAMINOPHEN 325 MG PO TABS: 650.0000 mg | ORAL_TABLET | ORAL | Status: DC | PRN

## 2017-07-14 MED ORDER — PROPOFOL 10 MG/ML IV BOLUS
INTRAVENOUS | Status: DC | PRN
  Administered 2017-07-14: 30 mg via INTRAVENOUS
  Administered 2017-07-14: 100 mg via INTRAVENOUS

## 2017-07-14 MED ORDER — METOCLOPRAMIDE HCL 5 MG/ML IJ SOLN: 5.0000 mg | Freq: Three times a day (TID) | INTRAMUSCULAR | Status: DC | PRN

## 2017-07-14 MED ORDER — METHOCARBAMOL 1000 MG/10ML IJ SOLN
500.0000 mg | Freq: Four times a day (QID) | INTRAMUSCULAR | Status: DC | PRN
  Filled 2017-07-14: qty 5

## 2017-07-14 MED ORDER — CEFAZOLIN SODIUM-DEXTROSE 2-4 GM/100ML-% IV SOLN
2.0000 g | INTRAVENOUS | Status: AC
Start: 2017-07-14 — End: 2017-07-14
  Administered 2017-07-14: 2 g via INTRAVENOUS
  Filled 2017-07-14: qty 100

## 2017-07-14 MED ORDER — BISACODYL 10 MG RE SUPP: 10.0000 mg | Freq: Every day | RECTAL | Status: DC | PRN

## 2017-07-14 MED ORDER — HYDROMORPHONE HCL 1 MG/ML IJ SOLN
0.2500 mg | INTRAMUSCULAR | Status: DC | PRN
  Administered 2017-07-14 (×4): 0.5 mg via INTRAVENOUS

## 2017-07-14 MED ORDER — METHOCARBAMOL 500 MG PO TABS
500.0000 mg | ORAL_TABLET | Freq: Four times a day (QID) | ORAL | Status: DC | PRN
  Administered 2017-07-14 – 2017-07-16 (×4): 500 mg via ORAL
  Filled 2017-07-14 (×6): qty 1

## 2017-07-14 MED ORDER — MIDAZOLAM HCL 5 MG/5ML IJ SOLN
INTRAMUSCULAR | Status: DC | PRN
  Administered 2017-07-14: 2 mg via INTRAVENOUS

## 2017-07-14 MED ORDER — ACETAMINOPHEN 650 MG RE SUPP: 650.0000 mg | RECTAL | Status: DC | PRN

## 2017-07-14 MED ORDER — SUCCINYLCHOLINE CHLORIDE 20 MG/ML IJ SOLN
INTRAMUSCULAR | Status: DC | PRN
  Administered 2017-07-14: 120 mg via INTRAVENOUS

## 2017-07-14 MED ORDER — METOCLOPRAMIDE HCL 5 MG PO TABS: 5.0000 mg | ORAL_TABLET | Freq: Three times a day (TID) | ORAL | Status: DC | PRN

## 2017-07-14 MED ORDER — INSULIN GLARGINE 100 UNIT/ML ~~LOC~~ SOLN: 25.0000 [IU] | Freq: Every day | SUBCUTANEOUS | Status: DC

## 2017-07-14 MED ORDER — HYDROMORPHONE HCL 1 MG/ML IJ SOLN
INTRAMUSCULAR | Status: AC
  Filled 2017-07-14: qty 1

## 2017-07-14 MED ORDER — ONDANSETRON HCL 4 MG PO TABS: 4.0000 mg | ORAL_TABLET | Freq: Four times a day (QID) | ORAL | Status: DC | PRN

## 2017-07-14 MED ORDER — CHLORHEXIDINE GLUCONATE 4 % EX LIQD: 60.0000 mL | Freq: Once | CUTANEOUS | Status: DC

## 2017-07-14 MED ORDER — INSULIN GLARGINE 100 UNIT/ML ~~LOC~~ SOLN
5.0000 [IU] | Freq: Once | SUBCUTANEOUS | Status: AC
  Administered 2017-07-14: 5 [IU] via SUBCUTANEOUS
  Filled 2017-07-14: qty 0.05

## 2017-07-14 MED ORDER — POTASSIUM CHLORIDE CRYS ER 20 MEQ PO TBCR
40.0000 meq | EXTENDED_RELEASE_TABLET | Freq: Every day | ORAL | Status: DC
  Administered 2017-07-14 – 2017-07-15 (×2): 40 meq via ORAL
  Filled 2017-07-14 (×2): qty 2

## 2017-07-14 MED ORDER — MEPERIDINE HCL 25 MG/ML IJ SOLN: 6.2500 mg | INTRAMUSCULAR | Status: DC | PRN

## 2017-07-14 MED ORDER — MIDAZOLAM HCL 2 MG/2ML IJ SOLN
INTRAMUSCULAR | Status: AC
  Filled 2017-07-14: qty 2

## 2017-07-14 MED ORDER — MAGNESIUM CITRATE PO SOLN: 1.0000 | Freq: Once | ORAL | Status: DC | PRN

## 2017-07-14 MED ORDER — ONDANSETRON HCL 4 MG/2ML IJ SOLN: 4.0000 mg | Freq: Four times a day (QID) | INTRAMUSCULAR | Status: DC | PRN

## 2017-07-14 MED ORDER — 0.9 % SODIUM CHLORIDE (POUR BTL) OPTIME
TOPICAL | Status: DC | PRN
  Administered 2017-07-14: 1000 mL

## 2017-07-14 MED ORDER — CHLORHEXIDINE GLUCONATE CLOTH 2 % EX PADS
6.0000 | MEDICATED_PAD | Freq: Every day | CUTANEOUS | Status: DC
  Administered 2017-07-14 – 2017-07-17 (×3): 6 via TOPICAL

## 2017-07-14 MED ORDER — LACTATED RINGERS IV SOLN
INTRAVENOUS | Status: DC
  Administered 2017-07-14: 16:00:00 via INTRAVENOUS

## 2017-07-14 MED ORDER — ONDANSETRON HCL 4 MG/2ML IJ SOLN: 4.0000 mg | Freq: Once | INTRAMUSCULAR | Status: DC | PRN

## 2017-07-14 MED ORDER — HYDROMORPHONE HCL 1 MG/ML IJ SOLN
1.0000 mg | INTRAMUSCULAR | Status: DC | PRN
  Administered 2017-07-14 – 2017-07-16 (×10): 1 mg via INTRAVENOUS
  Filled 2017-07-14 (×10): qty 1

## 2017-07-14 MED ORDER — POLYETHYLENE GLYCOL 3350 17 G PO PACK
17.0000 g | PACK | Freq: Every day | ORAL | Status: DC | PRN
  Administered 2017-07-14: 17 g via ORAL
  Filled 2017-07-14: qty 1

## 2017-07-14 SURGICAL SUPPLY — 32 items
BLADE SAW RECIP 87.9 MT (BLADE) ×6 IMPLANT
BLADE SURG 21 STRL SS (BLADE) ×3 IMPLANT
BNDG COHESIVE 6X5 TAN STRL LF (GAUZE/BANDAGES/DRESSINGS) ×6 IMPLANT
BNDG GAUZE ELAST 4 BULKY (GAUZE/BANDAGES/DRESSINGS) ×6 IMPLANT
COVER SURGICAL LIGHT HANDLE (MISCELLANEOUS) ×3 IMPLANT
CUFF TOURNIQUET SINGLE 34IN LL (TOURNIQUET CUFF) IMPLANT
CUFF TOURNIQUET SINGLE 44IN (TOURNIQUET CUFF) IMPLANT
DRAPE INCISE IOBAN 66X45 STRL (DRAPES) IMPLANT
DRAPE U-SHAPE 47X51 STRL (DRAPES) ×3 IMPLANT
DRESSING PREVENA PLUS CUSTOM (GAUZE/BANDAGES/DRESSINGS) ×1 IMPLANT
DRSG PREVENA PLUS CUSTOM (GAUZE/BANDAGES/DRESSINGS) ×3
ELECT REM PT RETURN 9FT ADLT (ELECTROSURGICAL) ×3
ELECTRODE REM PT RTRN 9FT ADLT (ELECTROSURGICAL) ×1 IMPLANT
GLOVE BIOGEL PI IND STRL 9 (GLOVE) ×1 IMPLANT
GLOVE BIOGEL PI INDICATOR 9 (GLOVE) ×2
GLOVE SURG ORTHO 9.0 STRL STRW (GLOVE) ×3 IMPLANT
GOWN STRL REUS W/ TWL XL LVL3 (GOWN DISPOSABLE) ×2 IMPLANT
GOWN STRL REUS W/TWL XL LVL3 (GOWN DISPOSABLE) ×6
KIT BASIN OR (CUSTOM PROCEDURE TRAY) ×3 IMPLANT
KIT ROOM TURNOVER OR (KITS) ×3 IMPLANT
MANIFOLD NEPTUNE II (INSTRUMENTS) ×3 IMPLANT
NS IRRIG 1000ML POUR BTL (IV SOLUTION) ×3 IMPLANT
PACK ORTHO EXTREMITY (CUSTOM PROCEDURE TRAY) ×3 IMPLANT
PAD ARMBOARD 7.5X6 YLW CONV (MISCELLANEOUS) ×3 IMPLANT
SPONGE LAP 18X18 X RAY DECT (DISPOSABLE) IMPLANT
STAPLER VISISTAT 35W (STAPLE) ×4 IMPLANT
STOCKINETTE IMPERVIOUS LG (DRAPES) ×3 IMPLANT
SUT ETHILON 2 0 PSLX (SUTURE) ×3 IMPLANT
SUT SILK 2 0 (SUTURE) ×3
SUT SILK 2-0 18XBRD TIE 12 (SUTURE) ×1 IMPLANT
SUT VIC AB 1 CTX 27 (SUTURE) ×6 IMPLANT
TOWEL OR 17X26 10 PK STRL BLUE (TOWEL DISPOSABLE) ×3 IMPLANT

## 2017-07-14 NOTE — Transfer of Care (Signed)
Immediate Anesthesia Transfer of Care Note  Patient: [name]  Procedure(s) Performed: RIGHT BELOW KNEE AMPUTATION (Right Leg Lower)  Patient Location: PACU  Anesthesia Type:General  Level of Consciousness: awake, alert  and patient cooperative  Airway & Oxygen Therapy: Patient Spontanous Breathing  Post-op Assessment: Report given to RN and Post -op Vital signs reviewed and stable  Post vital signs: Reviewed and stable  Last Vitals:  Vitals:   07/14/17 0944 07/14/17 1300  BP: (!) 151/93 (!) 150/90  Pulse: 75 72  Resp:  16  Temp:  36.6 C  SpO2:  100%    Last Pain:  Vitals:   07/14/17 1300  TempSrc: Oral  PainSc:       Patients Stated Pain Goal: 3 (07/14/17 0721)  Complications: No apparent anesthesia complications

## 2017-07-14 NOTE — Consult Note (Signed)
ORTHOPAEDIC CONSULTATION  REQUESTING PHYSICIAN: Nita Sells, MD  Chief Complaint: Dehiscence right transmetatarsal amputation  HPI: [name] is a 42 y.o. male who presents with diabetic insensate neuropathy peripheral vascular disease status post foot salvage intervention with dehiscence of the right transmetatarsal amputation.  Past Medical History:  Diagnosis Date  . Acute osteomyelitis, ankle and foot 07/30/2010   Qualifier: Diagnosis of  By: Tommy Medal MD, Roderic Scarce    . Anemia   . Chronic kidney disease (CKD), stage III (moderate) (HCC)   . Collagen vascular disease ([place])   . DDD (degenerative disc disease), lumbar   . Dehiscence of amputation stump (HCC)     dehiscence right transmetetarsal amputation achilles contracture  . Diabetic foot ulcer ([place]) 05/14/2017  . Gastroparesis   . GERD (gastroesophageal reflux disease)   . Headache   . Hiatal hernia   . Hyperlipidemia   . Hypertension   . Pancreatitis   . Peripheral vascular disease ([place])   . Polysubstance abuse ([place]) 03/08/2016  . Renal insufficiency   . Type II diabetes mellitus ([place]) dx'd ~ 1996  . Vascular disease    poor circulation to left foot   Past Surgical History:  Procedure Laterality Date  . AMPUTATION  04/25/2011   Procedure: AMPUTATION DIGIT;  Surgeon: Newt Minion, MD;  Location: [place];  Service: Orthopedics;  Laterality: Left;  Left foot 3rd toe amputation MTP joint, Gastroc Recession  Achilles Lengthening   . AMPUTATION Bilateral 03/25/2013   Procedure: AMPUTATION RAY;  Surgeon: Newt Minion, MD;  Location: [place];  Service: Orthopedics;  Laterality: Bilateral;  Left Great Toe Amputation at  MTP Joint, Right 1st and 2nd Ray Amputation   . AMPUTATION Right 10/03/2014   Procedure: AMPUTATION MIDFOOT;  Surgeon: Newt Minion, MD;  Location: [place];  Service: Orthopedics;  Laterality: Right;  . LAPAROSCOPIC CHOLECYSTECTOMY    . STUMP REVISION Right 05/23/2016   Procedure: Revision Right  Transmetatarsal Amputation, Right Gastrocnemius Recession;  Surgeon: Newt Minion, MD;  Location: [place];  Service: Orthopedics;  Laterality: Right;  . STUMP REVISION Right 05/15/2017   Procedure: REVISION RIGHT TRANSMETATARSAL AMPUTATION;  Surgeon: Newt Minion, MD;  Location: [place];  Service: Orthopedics;  Laterality: Right;  . TOE AMPUTATION  2012   left foot; great toe and second toe   Social History   Socioeconomic History  . Marital status: Divorced    Spouse name: None  . Number of children: None  . Years of education: None  . Highest education level: None  Social Needs  . Financial resource strain: None  . Food insecurity - worry: None  . Food insecurity - inability: None  . Transportation needs - medical: None  . Transportation needs - non-medical: None  Occupational History  . None  Tobacco Use  . Smoking status: Former Smoker    Packs/day: 0.10    Years: 4.00    Pack years: 0.40    Types: Cigarettes    Last attempt to quit: 06/10/2015    Years since quitting: 2.0  . Smokeless tobacco: Never Used  Substance and Sexual Activity  . Alcohol use: No  . Drug use: Yes    Frequency: 10.0 times per week    Types: Marijuana  . Sexual activity: Yes    Birth control/protection: None  Other Topics Concern  . None  Social History Narrative  . None   Family History  Problem Relation Age of Onset  . Heart attack Father  14  . Hypertension Sister    - negative except otherwise stated in the family history section Allergies  Allergen Reactions  . No Known Allergies    Prior to Admission medications   Medication Sig Start Date End Date Taking? Authorizing Provider  atorvastatin (LIPITOR) 10 MG tablet Take 10 mg by mouth daily. 03/29/17  Yes [provider]  blood glucose meter kit and supplies KIT Dispense based on patient and insurance preference. Use up to four times daily as directed. (FOR ICD-9 250.00, 250.01). 05/16/17  Yes Rai, Ripudeep K, MD  doxycycline  (VIBRA-TABS) 100 MG tablet Take 1 tablet (100 mg total) by mouth 2 (two) times daily. 06/17/17  Yes Suzan Slick, NP  gabapentin (NEURONTIN) 100 MG capsule Take 2 capsules (200 mg total) by mouth 3 (three) times daily as needed (nerve pain). 05/16/17  Yes Rai, Ripudeep K, MD  Insulin Syringes, Disposable, U-100 0.5 ML MISC Use with lantus and novolog vials. 05/16/17  Yes Rai, Ripudeep K, MD  LANTUS SOLOSTAR 100 UNIT/ML Solostar Pen Inject 30 Units into the skin at bedtime. 07/10/17  Yes [provider]  lisinopril (PRINIVIL,ZESTRIL) 10 MG tablet Take 1 tablet (10 mg total) by mouth every morning. HOLD UNTIL FOLLOW-UP WITH YOUR DOCTOR 05/16/17  Yes Rai, Ripudeep K, MD  methocarbamol (ROBAXIN) 500 MG tablet Take 1 tablet (500 mg total) by mouth every 6 (six) hours as needed for muscle spasms. 05/16/17  Yes Rai, Ripudeep K, MD  NOVOLOG FLEXPEN 100 UNIT/ML FlexPen Inject 5 Units into the skin 3 (three) times daily. With meals, per sliding scale 07/10/17  Yes [provider]  oxyCODONE-acetaminophen (PERCOCET/ROXICET) 5-325 MG tablet Take 1 tablet by mouth every 8 (eight) hours as needed for severe pain. 06/17/17  Yes Dondra Prader R, NP  insulin aspart (NOVOLOG) 100 UNIT/ML injection Inject 8 Units into the skin 3 (three) times daily with meals. Patient not taking: Reported on 07/12/2017 05/16/17   Rai, Vernelle Emerald, MD  insulin glargine (LANTUS) 100 UNIT/ML injection Inject 0.28 mLs (28 Units total) into the skin at bedtime. Patient not taking: Reported on 07/12/2017 05/16/17   Rai, Vernelle Emerald, MD  amLODipine (NORVASC) 2.5 MG tablet Take 1 tablet (2.5 mg total) by mouth daily. Patient not taking: Reported on 01/25/2015 10/05/14 05/26/15  Regalado, Jerald Kief A, MD  pantoprazole (PROTONIX) 40 MG tablet Take 1 tablet (40 mg total) by mouth 2 (two) times daily. Patient not taking: Reported on 10/01/2014 09/30/14 05/26/15  Barton Dubois, MD   Portable Chest 1 View  Result Date: 07/13/2017 CLINICAL DATA:   Preoperative examination prior to foot surgery. EXAM: PORTABLE CHEST 1 VIEW COMPARISON:  Chest x-ray of May 07, 2016 FINDINGS: The lungs are adequately inflated and clear. The heart and pulmonary vascularity are normal. The mediastinum is normal in width. There is no pleural effusion. The bony thorax is unremarkable. IMPRESSION: There is no active cardiopulmonary disease. Electronically Signed   By: David  Martinique [name]   On: 07/13/2017 07:19   Dg Foot 2 Views Right  Result Date: 07/12/2017 CLINICAL DATA:  Osteomyelitis. EXAM: RIGHT FOOT - 2 VIEW COMPARISON:  05/29/2017 FINDINGS: Post transmetatarsal amputation of the right foot. Indistinct margin of the cut edge of the second and third metatarsal bones, may be ongoing osteomyelitis. No large gas collections within the soft tissues. IMPRESSION: Status post transmetatarsal amputation of the right foot with indistinct margin of the cut edge of the second and third metatarsal bones, which may represent ongoing osteomyelitis. No evidence  of large soft tissue gas collections. Electronically Signed   By: Fidela Salisbury [name]   On: 07/12/2017 13:35   - pertinent xrays, CT, MRI studies were reviewed and independently interpreted  Positive ROS: All other systems have been reviewed and were otherwise negative with the exception of those mentioned in the HPI and as above.  Physical Exam: General: Alert, no acute distress Psychiatric: Patient is competent for consent with normal mood and affect Lymphatic: No axillary or cervical lymphadenopathy Cardiovascular: No pedal edema Respiratory: No cyanosis, no use of accessory musculature GI: No organomegaly, abdomen is soft and non-tender  Skin:  Examination patient has massive dehiscence of the transmetatarsal amputation with exposed bone.  Neurologic: Patient does not have protective sensation bilateral lower extremities.   MUSCULOSKELETAL:  Examination patient does not have a palpable pulse.  He has  massive dehiscence of the transmetatarsal amputation.  Assessment: Assessment: Diabetic insensate neuropathy with peripheral vascular disease with dehiscence of the transmetatarsal amputation with exposed bone.  Plan: Plan: We will plan for a right transtibial amputation today at 5 PM.  Plan for a wound VAC postoperatively plan for discharge to home or skilled nursing facility depending on patient's ambulatory status.  Thank you for the consult and the opportunity to see [name], [place] [phone] 6:37 AM

## 2017-07-14 NOTE — Op Note (Signed)
   Date of Surgery: 07/14/2017  INDICATIONS: [name] is a 42 y.o.-year-old male who is status post dehiscence of a right transmetatarsal amputation he is undergone prolonged conservative therapy and presents at this time for transtibial amputation after failure of conservative foot salvage intervention.[name].  PREOPERATIVE DIAGNOSIS: Dehiscence osteomyelitis right transmetatarsal amputation  POSTOPERATIVE DIAGNOSIS: Same.  PROCEDURE: Transtibial amputation Application of Prevena wound VAC  SURGEON: Lajoyce Cornersuda, [name]  ANESTHESIA:  general  IV FLUIDS AND URINE: See anesthesia.  ESTIMATED BLOOD LOSS: 200 mL.  COMPLICATIONS: None.  DESCRIPTION OF PROCEDURE: The patient was brought to the operating room and underwent a general anesthetic. After adequate levels of anesthesia were obtained patient's lower extremity was prepped using DuraPrep draped into a sterile field. A timeout was called. The foot was draped out of the sterile field with impervious stockinette. A transverse incision was made 11 cm distal to the tibial tubercle. This curved proximally and a large posterior flap was created. The tibia was transected 1 cm proximal to the skin incision. The fibula was transected just proximal to the tibial incision. The tibia was beveled anteriorly. A large posterior flap was created. The sciatic nerve was pulled cut and allowed to retract. The vascular bundles were suture ligated with 2-0 silk. The deep and superficial fascial layers were closed using #1 Vicryl. The skin was closed using staples and 2-0 nylon. The wound was covered with a Prevena wound VAC. There was a good suction fit.  Patient was extubated taken to the PACU in stable condition.   DISCHARGE PLANNING:  Antibiotic duration: 23 hours postoperatively  Weightbearing: Nonweightbearing on the right  Pain medication: As needed  Dressing care/ Wound VAC: Continue wound VAC for 5 days or until discharge  Discharge to: Home or skilled  nursing pending physical therapy recommendations.  Follow-up: In the office 1 week post operative.  Curtis BakerMarcus Roman Dubuc, MD [facility] 6:24 PM

## 2017-07-14 NOTE — Plan of Care (Signed)
  Progressing Health Behavior/Discharge Planning: Ability to manage health-related needs will improve 07/14/2017 1035 - Progressing by Quentin CornwallMadison, Amila Callies, RN Clinical Measurements: Ability to maintain clinical measurements within normal limits will improve 07/14/2017 1035 - Progressing by Quentin CornwallMadison, Guerry Covington, RN Will remain free from infection 07/14/2017 1035 - Progressing by Quentin CornwallMadison, Fateh Kindle, RN Diagnostic test results will improve 07/14/2017 1035 - Progressing by Quentin CornwallMadison, Blanca Thornton, RN Respiratory complications will improve 07/14/2017 1035 - Progressing by Quentin CornwallMadison, Lotoya Casella, RN Cardiovascular complication will be avoided 07/14/2017 1035 - Progressing by Quentin CornwallMadison, Devarious Pavek, RN Activity: Risk for activity intolerance will decrease 07/14/2017 1035 - Progressing by Quentin CornwallMadison, Nocholas Damaso, RN Nutrition: Adequate nutrition will be maintained 07/14/2017 1035 - Progressing by Quentin CornwallMadison, Laryah Neuser, RN Coping: Level of anxiety will decrease 07/14/2017 1035 - Progressing by Quentin CornwallMadison, Shandon Matson, RN Elimination: Will not experience complications related to bowel motility 07/14/2017 1035 - Progressing by Quentin CornwallMadison, Louana Fontenot, RN Will not experience complications related to urinary retention 07/14/2017 1035 - Progressing by Quentin CornwallMadison, Kyeshia Zinn, RN Pain Managment: General experience of comfort will improve 07/14/2017 1035 - Progressing by Quentin CornwallMadison, Ignace Mandigo, RN Safety: Ability to remain free from injury will improve 07/14/2017 1035 - Progressing by Quentin CornwallMadison, Oceane Fosse, RN Skin Integrity: Risk for impaired skin integrity will decrease 07/14/2017 1035 - Progressing by Quentin CornwallMadison, Tya Haughey, RN

## 2017-07-14 NOTE — Anesthesia Procedure Notes (Signed)
Procedure Name: Intubation Date/Time: 07/14/2017 5:37 PM Performed by: Babs Bertin, CRNA Pre-anesthesia Checklist: Patient identified, Emergency Drugs available, Suction available and Patient being monitored Patient Re-evaluated:Patient Re-evaluated prior to induction Oxygen Delivery Method: Circle System Utilized Preoxygenation: Pre-oxygenation with 100% oxygen Induction Type: IV induction and Rapid sequence Laryngoscope Size: Mac and 4 Grade View: Grade I Tube type: Oral Tube size: 7.5 mm Number of attempts: 1 Airway Equipment and Method: Stylet and Oral airway Placement Confirmation: ETT inserted through vocal cords under direct vision,  positive ETCO2 and breath sounds checked- equal and bilateral Secured at: 23 cm Tube secured with: Tape Dental Injury: Teeth and Oropharynx as per pre-operative assessment

## 2017-07-14 NOTE — Anesthesia Preprocedure Evaluation (Signed)
Anesthesia Evaluation  Patient identified by MRN, date of birth, ID band Patient awake    Reviewed: Allergy & Precautions, NPO status , Patient's Chart, lab work & pertinent test results  Airway Mallampati: II  TM Distance: >3 FB Neck ROM: Full    Dental   Pulmonary former smoker,    Pulmonary exam normal        Cardiovascular hypertension, Pt. on medications Normal cardiovascular exam     Neuro/Psych    GI/Hepatic GERD  Medicated and Controlled,  Endo/Other  diabetes, Type 2, Oral Hypoglycemic Agents  Renal/GU Renal InsufficiencyRenal disease     Musculoskeletal   Abdominal   Peds  Hematology   Anesthesia Other Findings   Reproductive/Obstetrics                             Anesthesia Physical Anesthesia Plan  ASA: III  Anesthesia Plan: General   Post-op Pain Management:    Induction: Intravenous  PONV Risk Score and Plan: 2  Airway Management Planned: Oral ETT  Additional Equipment:   Intra-op Plan:   Post-operative Plan: Extubation in OR  Informed Consent: I have reviewed the patients History and Physical, chart, labs and discussed the procedure including the risks, benefits and alternatives for the proposed anesthesia with the patient or authorized representative who has indicated his/her understanding and acceptance.     Plan Discussed with: CRNA and Surgeon  Anesthesia Plan Comments:         Anesthesia Quick Evaluation

## 2017-07-14 NOTE — Anesthesia Postprocedure Evaluation (Signed)
Anesthesia Post Note  Patient: [name]  Procedure(s) Performed: RIGHT BELOW KNEE AMPUTATION (Right Leg Lower)     Patient location during evaluation: PACU Anesthesia Type: General Level of consciousness: awake and alert Pain management: pain level controlled Vital Signs Assessment: post-procedure vital signs reviewed and stable Respiratory status: spontaneous breathing, nonlabored ventilation, respiratory function stable and patient connected to nasal cannula oxygen Cardiovascular status: blood pressure returned to baseline and stable Postop Assessment: no apparent nausea or vomiting Anesthetic complications: no    Last Vitals:  Vitals:   07/14/17 1924 07/14/17 1930  BP: 124/79   Pulse: 78 78  Resp: (!) 8 (!) 8  Temp: 36.5 C   SpO2: 94% 97%    Last Pain:  Vitals:   07/14/17 1924  TempSrc:   PainSc: Asleep                 Vonnie Spagnolo DAVID

## 2017-07-14 NOTE — Progress Notes (Signed)
PROGRESS NOTE   [name]  ZOX:[id number]    DOB: 1975-11-24    DOA: 07/12/2017  PCP: Darrow Bussing, MD   I have briefly reviewed patients previous medical records in [facility].  Brief Narrative:  7 aam poorly controlled DM-a1c 14 in 05/2017, right transmetatarsal amputation 05/15/17 due to osteomyelitis, HTN, HLD, GERD, gastroparesis, stage III chronic kidney disease, prior Polysusbt abuse -cocaine and Opiates presented to ED due to foul-smelling nonhealing right foot wound and nausea and nonbloody emesis. Had been seen in Dr. Lajoyce Corners office 1/8 and 1/15 and had declined to go through with transtibial amputation of right leg scheduled in January.   Admitted for suspected right foot osteomyelitis, infected wound and diabetic gastroparesis.  Orthopedics consulted and plan surgery 07/14/17.   Assessment & Plan:   Principal Problem:   Osteomyelitis (HCC) Active Problems:   Diabetes mellitus (HCC)   Nausea & vomiting   CKD (chronic kidney disease), stage III (HCC)   Anemia   Benign essential HTN   Diabetic foot ulcer with osteomyelitis (HCC)   Abdominal pain, lower   GERD (gastroesophageal reflux disease)   Gastroparesis   Acute osteomyelitis (HCC)   1. Nonhealing right foot wound/osteomyelitis: Prior trans-metatarsal amputation.  Empirically placed on IV vancomycin and Zosyn.  Orthopedics/Dr. Lajoyce Corners consulted and plans surgical intervention 07/14/17 and paitent is npo for procedure--will await surgery, ask therapy to see in am with wght beaing precautions from Ortho and defer Abx choices based on margins found at surgery 2. Nausea and vomiting/gastroparesis: Related to poorly controlled long-standing diabetes.  A1c 2/3: 10.7.  Lipase normal.  Treat supportively with antiemetics, PPI and advance diet as tolerated.  Tolerating clear liquids. 3. Poorly controlled DM 2: A1c 10.7.  Reported noncompliance. On admit CBG greater than 400.  Currently reasonably controlled.  Continue current  dose of Lantus and SSI.  Adjust insulins as needed. lantus 30 U at home and 8 u tid 4. Essential hypertension: Mildly uncontrolled at times.  Continue lisinopril10 qod and amlodipine 2.5 qd. 5. Stage III chronic kidney disease: Baseline creatinine fluctuating but may be in the 1.6-1.9 range. No acute changes 6. Anemia: Likely combination of chronic kidney disease, chronic disease and dilutional.  To follow CBC daily. Hypokalemia: labs from ths am are pending-replace with Kdur 40  DVT prophylaxis: Heparin Code Status: Full Family Communication: Discussed in detail with spouse at bedside. Disposition: DC home pending clinical improvement.   Consultants:  Orthopedics  Procedures:  None  Antimicrobials:  IV vancomycin and Zosyn.   Subjective:  Awake alert npo for procedure Sugar was low las pm 80 so no insulingwas given No cp No further n/v and doesn't typically vomit at home --thinks related to infection  Objective:  Vitals:   07/13/17 1000 07/13/17 1300 07/13/17 2043 07/14/17 0615  BP: 112/68 (!) 175/87 122/71 (!) 160/88  Pulse:  73 65 72  Resp:  16 17 17   Temp:  98.6 F (37 C) 98.4 F (36.9 C) 98.6 F (37 C)  TempSrc:  Oral Oral Oral  SpO2:  100% 98% 99%  Weight:      Height:        Examination:  Awake alert obese pleasant nad s1 s 2no m/r/g abd soft nt nd no reboun No neuro deficit Wound not examined-see picture from 2/4   Data Reviewed: I have personally reviewed following labs and imaging studies  CBC: Recent Labs  Lab 07/12/17 1047 07/13/17 0458 07/14/17 0612  WBC 9.6 8.9 7.6  NEUTROABS 7.8*  --   --  HGB 12.5* 9.7* 10.5*  HCT 37.8* 30.3* 33.0*  MCV 89.6 89.9 89.2  PLT 242 187 205   Basic Metabolic Panel: Recent Labs  Lab 07/12/17 1047 07/12/17 1805 07/13/17 0458  NA 133*  --  137  K 4.1  --  3.4*  CL 95*  --  105  CO2 23  --  25  GLUCOSE 408*  --  135*  BUN 18  --  16  CREATININE 1.94* 1.67* 1.83*  CALCIUM 9.5  --  8.1*   Liver  Function Tests: Recent Labs  Lab 07/12/17 1047  AST 20  ALT 17  ALKPHOS 104  BILITOT 0.8  PROT 8.4*  ALBUMIN 3.5   Coagulation Profile: Recent Labs  Lab 07/13/17 0458  INR 1.21   Cardiac Enzymes: No results for input(s): CKTOTAL, CKMB, CKMBINDEX, TROPONINI in the last 168 hours. HbA1C: Recent Labs    07/12/17 1805  HGBA1C 10.7*   CBG: Recent Labs  Lab 07/13/17 0601 07/13/17 1158 07/13/17 1626 07/13/17 2155 07/14/17 0639  GLUCAP 131* 171* 149* 87 120*    Recent Results (from the past 240 hour(s))  Surgical pcr screen     Status: Abnormal   Collection Time: 07/14/17  3:51 AM  Result Value Ref Range Status   MRSA, PCR NEGATIVE NEGATIVE Final   Staphylococcus aureus POSITIVE (A) NEGATIVE Final    Comment: (NOTE) The Xpert SA Assay (FDA approved for NASAL specimens in patients 41 years of age and older), is one component of a comprehensive surveillance program. It is not intended to diagnose infection nor to guide or monitor treatment.          Radiology Studies: Portable Chest 1 View  Result Date: 07/13/2017 CLINICAL DATA:  Preoperative examination prior to foot surgery. EXAM: PORTABLE CHEST 1 VIEW COMPARISON:  Chest x-ray of May 07, 2016 FINDINGS: The lungs are adequately inflated and clear. The heart and pulmonary vascularity are normal. The mediastinum is normal in width. There is no pleural effusion. The bony thorax is unremarkable. IMPRESSION: There is no active cardiopulmonary disease. Electronically Signed   By: David  SwazilandJordan [name]   On: 07/13/2017 07:19   Dg Foot 2 Views Right  Result Date: 07/12/2017 CLINICAL DATA:  Osteomyelitis. EXAM: RIGHT FOOT - 2 VIEW COMPARISON:  05/29/2017 FINDINGS: Post transmetatarsal amputation of the right foot. Indistinct margin of the cut edge of the second and third metatarsal bones, may be ongoing osteomyelitis. No large gas collections within the soft tissues. IMPRESSION: Status post transmetatarsal amputation of the  right foot with indistinct margin of the cut edge of the second and third metatarsal bones, which may represent ongoing osteomyelitis. No evidence of large soft tissue gas collections. Electronically Signed   By: Ted Mcalpineobrinka  Dimitrova [name]   On: 07/12/2017 13:35        Scheduled Meds: . amLODipine  2.5 mg Oral Daily  . atorvastatin  10 mg Oral q1800  . chlorhexidine  60 mL Topical Once  . Chlorhexidine Gluconate Cloth  6 each Topical Daily  . insulin aspart  0-20 Units Subcutaneous TID WC  . insulin aspart  0-5 Units Subcutaneous QHS  . insulin glargine  30 Units Subcutaneous QHS  . lisinopril  10 mg Oral Daily  . mupirocin ointment  1 application Nasal BID  . pantoprazole  40 mg Oral BID   Continuous Infusions: . sodium chloride    .  ceFAZolin (ANCEF) IV    . piperacillin-tazobactam (ZOSYN)  IV Stopped (07/14/17 0412)  . vancomycin  1,500 mg (07/13/17 1645)     LOS: 2 days     Rhetta Mura, MD, FACP, [facility]. [facility] Pager [phone]  If 7PM-7AM, please contact night-coverage [email or web address] Password [facility] 07/14/2017, 8:19 AM

## 2017-07-15 ENCOUNTER — Encounter (HOSPITAL_COMMUNITY): Payer: Self-pay | Admitting: Orthopedic Surgery

## 2017-07-15 DIAGNOSIS — E118 Type 2 diabetes mellitus with unspecified complications: Secondary | ICD-10-CM

## 2017-07-15 DIAGNOSIS — D649 Anemia, unspecified: Secondary | ICD-10-CM

## 2017-07-15 DIAGNOSIS — N183 Chronic kidney disease, stage 3 (moderate): Secondary | ICD-10-CM

## 2017-07-15 DIAGNOSIS — E119 Type 2 diabetes mellitus without complications: Secondary | ICD-10-CM

## 2017-07-15 DIAGNOSIS — Z794 Long term (current) use of insulin: Secondary | ICD-10-CM

## 2017-07-15 DIAGNOSIS — E1165 Type 2 diabetes mellitus with hyperglycemia: Secondary | ICD-10-CM

## 2017-07-15 DIAGNOSIS — I739 Peripheral vascular disease, unspecified: Secondary | ICD-10-CM

## 2017-07-15 DIAGNOSIS — G8918 Other acute postprocedural pain: Secondary | ICD-10-CM

## 2017-07-15 DIAGNOSIS — E1129 Type 2 diabetes mellitus with other diabetic kidney complication: Secondary | ICD-10-CM

## 2017-07-15 DIAGNOSIS — E1142 Type 2 diabetes mellitus with diabetic polyneuropathy: Secondary | ICD-10-CM

## 2017-07-15 DIAGNOSIS — D62 Acute posthemorrhagic anemia: Secondary | ICD-10-CM

## 2017-07-15 DIAGNOSIS — F1910 Other psychoactive substance abuse, uncomplicated: Secondary | ICD-10-CM

## 2017-07-15 DIAGNOSIS — E876 Hypokalemia: Secondary | ICD-10-CM

## 2017-07-15 DIAGNOSIS — Z720 Tobacco use: Secondary | ICD-10-CM

## 2017-07-15 DIAGNOSIS — R809 Proteinuria, unspecified: Secondary | ICD-10-CM

## 2017-07-15 DIAGNOSIS — E138 Other specified diabetes mellitus with unspecified complications: Secondary | ICD-10-CM

## 2017-07-15 DIAGNOSIS — M8610 Other acute osteomyelitis, unspecified site: Secondary | ICD-10-CM

## 2017-07-15 DIAGNOSIS — S88111D Complete traumatic amputation at level between knee and ankle, right lower leg, subsequent encounter: Secondary | ICD-10-CM

## 2017-07-15 LAB — RENAL FUNCTION PANEL
ALBUMIN: 2.9 g/dL — AB (ref 3.5–5.0)
Anion gap: 13 (ref 5–15)
BUN: 12 mg/dL (ref 6–20)
CHLORIDE: 101 mmol/L (ref 101–111)
CO2: 22 mmol/L (ref 22–32)
CREATININE: 1.73 mg/dL — AB (ref 0.61–1.24)
Calcium: 8.6 mg/dL — ABNORMAL LOW (ref 8.9–10.3)
GFR, EST AFRICAN AMERICAN: 55 mL/min — AB (ref >60)
GFR, EST NON AFRICAN AMERICAN: 48 mL/min — AB (ref >60)
Glucose, Bld: 108 mg/dL — ABNORMAL HIGH (ref 65–99)
PHOSPHORUS: 3.6 mg/dL (ref 2.5–4.6)
POTASSIUM: 3.3 mmol/L — AB (ref 3.5–5.1)
Sodium: 136 mmol/L (ref 135–145)

## 2017-07-15 LAB — CBC WITH DIFFERENTIAL/PLATELET
BASOS ABS: 0 10*3/uL (ref 0.0–0.1)
Basophils Relative: 0 %
Eosinophils Absolute: 0.4 10*3/uL (ref 0.0–0.7)
Eosinophils Relative: 5 %
HCT: 32.4 % — ABNORMAL LOW (ref 39.0–52.0)
HEMOGLOBIN: 10.5 g/dL — AB (ref 13.0–17.0)
LYMPHS ABS: 1.8 10*3/uL (ref 0.7–4.0)
LYMPHS PCT: 20 %
MCH: 28.5 pg (ref 26.0–34.0)
MCHC: 32.4 g/dL (ref 30.0–36.0)
MCV: 88 fL (ref 78.0–100.0)
Monocytes Absolute: 0.7 10*3/uL (ref 0.1–1.0)
Monocytes Relative: 8 %
NEUTROS PCT: 67 %
Neutro Abs: 5.8 10*3/uL (ref 1.7–7.7)
Platelets: 182 10*3/uL (ref 150–400)
RBC: 3.68 MIL/uL — AB (ref 4.22–5.81)
RDW: 12.9 % (ref 11.5–15.5)
WBC: 8.7 10*3/uL (ref 4.0–10.5)

## 2017-07-15 LAB — GLUCOSE, CAPILLARY
GLUCOSE-CAPILLARY: 101 mg/dL — AB (ref 65–99)
GLUCOSE-CAPILLARY: 107 mg/dL — AB (ref 65–99)
Glucose-Capillary: 135 mg/dL — ABNORMAL HIGH (ref 65–99)
Glucose-Capillary: 150 mg/dL — ABNORMAL HIGH (ref 65–99)

## 2017-07-15 MED ORDER — POTASSIUM CHLORIDE CRYS ER 20 MEQ PO TBCR
40.0000 meq | EXTENDED_RELEASE_TABLET | Freq: Two times a day (BID) | ORAL | Status: DC
  Administered 2017-07-15 – 2017-07-17 (×4): 40 meq via ORAL
  Filled 2017-07-15 (×4): qty 2

## 2017-07-15 MED ORDER — AMLODIPINE BESYLATE 5 MG PO TABS
5.0000 mg | ORAL_TABLET | Freq: Every day | ORAL | Status: DC
  Administered 2017-07-16 – 2017-07-17 (×2): 5 mg via ORAL
  Filled 2017-07-15 (×3): qty 1

## 2017-07-15 NOTE — Consult Note (Signed)
Physical Medicine and Rehabilitation Consult   Reason for Consult: R-BKA with functional deficits.  Referring Physician: Dr. Verlon Au.    HPI: [name] is a 42 y.o. male with history of uncontrolled DM with insensate neuropathy, polysubstance abuse, PVD, osteomyelitis with failed attempts at limb salvage.  History taken from chart review, patient and family.  He was admitted on 07/12/17 with dehiscence of right transmetatarsal amputation with bone exposure as well as N/V. He was stared on broad spectrum antibiotics and dehiscence. He underwent right transtibial amputation on 07/14/17 by Dr. Sharol Given.  Hospital course complicated by hyperkalemia, postop pain, acute blood loss anemia.  VAC noted to be leaking without seal, discussed with nursing.  Therapy evaluations done today and CIR recommended due to functional deficits.    Review of Systems  Constitutional: Negative for chills and fever.  HENT: Negative for hearing loss and tinnitus.   Eyes: Negative for blurred vision and double vision.  Respiratory: Negative for cough and shortness of breath.   Cardiovascular: Negative for chest pain.  Gastrointestinal: Positive for heartburn. Negative for abdominal pain.  Genitourinary: Negative for dysuria and urgency.  Musculoskeletal: Positive for joint pain. Negative for myalgias.  Skin: Negative for itching and rash.  Neurological: Positive for sensory change (BLE). Negative for dizziness and headaches.  Psychiatric/Behavioral: The patient is not nervous/anxious.   All other systems reviewed and are negative.     Past Medical History:  Diagnosis Date  . Acute osteomyelitis, ankle and foot 07/30/2010   Qualifier: Diagnosis of  By: Tommy Medal MD, Roderic Scarce    . Anemia   . Chronic kidney disease (CKD), stage III (moderate) (HCC)   . Collagen vascular disease ([place])   . DDD (degenerative disc disease), lumbar   . Dehiscence of amputation stump (HCC)     dehiscence right transmetetarsal  amputation achilles contracture  . Diabetic foot ulcer ([place]) 05/14/2017  . Gastroparesis   . GERD (gastroesophageal reflux disease)   . Headache   . Hiatal hernia   . Hyperlipidemia   . Hypertension   . Pancreatitis   . Peripheral vascular disease ([place])   . Polysubstance abuse ([place]) 03/08/2016  . Renal insufficiency   . Type II diabetes mellitus ([place]) dx'd ~ 1996  . Vascular disease    poor circulation to left foot    Past Surgical History:  Procedure Laterality Date  . AMPUTATION  04/25/2011   Procedure: AMPUTATION DIGIT;  Surgeon: Newt Minion, MD;  Location: [place];  Service: Orthopedics;  Laterality: Left;  Left foot 3rd toe amputation MTP joint, Gastroc Recession  Achilles Lengthening   . AMPUTATION Bilateral 03/25/2013   Procedure: AMPUTATION RAY;  Surgeon: Newt Minion, MD;  Location: [place];  Service: Orthopedics;  Laterality: Bilateral;  Left Great Toe Amputation at  MTP Joint, Right 1st and 2nd Ray Amputation   . AMPUTATION Right 10/03/2014   Procedure: AMPUTATION MIDFOOT;  Surgeon: Newt Minion, MD;  Location: [place];  Service: Orthopedics;  Laterality: Right;  . AMPUTATION Right 07/14/2017   Procedure: RIGHT BELOW KNEE AMPUTATION;  Surgeon: Newt Minion, MD;  Location: [place];  Service: Orthopedics;  Laterality: Right;  . LAPAROSCOPIC CHOLECYSTECTOMY    . STUMP REVISION Right 05/23/2016   Procedure: Revision Right Transmetatarsal Amputation, Right Gastrocnemius Recession;  Surgeon: Newt Minion, MD;  Location: [place];  Service: Orthopedics;  Laterality: Right;  . STUMP REVISION Right 05/15/2017   Procedure: REVISION RIGHT TRANSMETATARSAL AMPUTATION;  Surgeon: Meridee Score  V, MD;  Location: [place];  Service: Orthopedics;  Laterality: Right;  . TOE AMPUTATION  2012   left foot; great toe and second toe    Family History  Problem Relation Age of Onset  . Heart attack Father 46  . Hypertension Sister     Social History:  Married. Wife currently unemployed. He was  independent and used cane occasionally PTA. He reports that he was smoking about 2-3 cigarettes PTA. His smoking use included cigarettes. He has a 0.40 pack-year smoking history. he has never used smokeless tobacco. Per reports that he uses drugs. Drug: Marijuana. Frequency: 10.00 times per week. He reports that he does not drink alcohol.   Allergies  Allergen Reactions  . No Known Allergies     Medications Prior to Admission  Medication Sig Dispense Refill  . atorvastatin (LIPITOR) 10 MG tablet Take 10 mg by mouth daily.    . blood glucose meter kit and supplies KIT Dispense based on patient and insurance preference. Use up to four times daily as directed. (FOR ICD-9 250.00, 250.01). 1 each 0  . doxycycline (VIBRA-TABS) 100 MG tablet Take 1 tablet (100 mg total) by mouth 2 (two) times daily. 60 tablet 0  . gabapentin (NEURONTIN) 100 MG capsule Take 2 capsules (200 mg total) by mouth 3 (three) times daily as needed (nerve pain). 90 capsule 0  . Insulin Syringes, Disposable, U-100 0.5 ML MISC Use with lantus and novolog vials. 100 each 3  . LANTUS SOLOSTAR 100 UNIT/ML Solostar Pen Inject 30 Units into the skin at bedtime.    [name] lisinopril (PRINIVIL,ZESTRIL) 10 MG tablet Take 1 tablet (10 mg total) by mouth every morning. HOLD UNTIL FOLLOW-UP WITH YOUR DOCTOR 30 tablet 1  . methocarbamol (ROBAXIN) 500 MG tablet Take 1 tablet (500 mg total) by mouth every 6 (six) hours as needed for muscle spasms. 30 tablet 0  . NOVOLOG FLEXPEN 100 UNIT/ML FlexPen Inject 5 Units into the skin 3 (three) times daily. With meals, per sliding scale    . oxyCODONE-acetaminophen (PERCOCET/ROXICET) 5-325 MG tablet Take 1 tablet by mouth every 8 (eight) hours as needed for severe pain. 30 tablet 0  . insulin aspart (NOVOLOG) 100 UNIT/ML injection Inject 8 Units into the skin 3 (three) times daily with meals. (Patient not taking: Reported on 07/12/2017) 10 mL 1  . insulin glargine (LANTUS) 100 UNIT/ML injection Inject 0.28 mLs  (28 Units total) into the skin at bedtime. (Patient not taking: Reported on 07/12/2017) 10 mL 1    Home: [place] expects to be discharged to:: Private residence Living Arrangements: Spouse/significant other Available Help at Discharge: Family, Available PRN/intermittently Type of Home: House Home Access: Ramped entrance [place]: One level Bathroom Shower/Tub: Chiropodist: [place]: Environmental consultant - 2 wheels, [place] - single point, Careers adviser History: Prior Function Level of Independence: Independent with assistive device(s) Comments: uses a cane at baseline  Functional Status:  Mobility: Bed Mobility Overal bed mobility: Needs Assistance Bed Mobility: Supine to Sit Supine to sit: Supervision General bed mobility comments: Cues for technique Transfers Overall transfer level: Needs assistance Equipment used: Rolling walker (2 wheeled) Transfers: Sit to/from Stand Sit to Stand: Min assist General transfer comment: Very good rise; min assist to steady with transition of hands from bed to RW Ambulation/Gait Ambulation/Gait assistance: Min guard Ambulation Distance (Feet): (pivot "hop" steps bed to recliner) Assistive device: Rolling walker (2 wheeled) Gait Pattern/deviations: (Hop to ) General Gait Details: noting  tending to hop-almost jump- with support from RW; very energetically taxing; Cues to support body weight in RW for smoother stepping of L foot    ADL:    Cognition: Cognition Overall Cognitive Status: Within Functional Limits for tasks assessed Orientation Level: Oriented X4 Cognition Arousal/Alertness: Awake/alert Behavior During Therapy: WFL for tasks assessed/performed Overall Cognitive Status: Within Functional Limits for tasks assessed  Blood pressure (!) 142/82, pulse 82, temperature 98.7 F (37.1 C), temperature source Oral, resp. rate 18, height _0  (1.854 m), weight 54 kg (119 lb 1.6 oz),  SpO2 100 %. Physical Exam  Nursing note and vitals reviewed. Constitutional: He is oriented to person, place, and time. He appears well-developed and well-nourished. No distress.  HENT:  Head: Normocephalic and atraumatic.  Mouth/Throat: Oropharynx is clear and moist.  Eyes: Conjunctivae and EOM are normal. Pupils are equal, round, and reactive to light.  Neck: Normal range of motion. Neck supple.  Cardiovascular: Normal rate and regular rhythm.  Respiratory: Effort normal and breath sounds normal. No stridor. No respiratory distress. He has no wheezes.  GI: Soft. Bowel sounds are normal. He exhibits no distension. There is no tenderness.  Musculoskeletal: He exhibits edema.  Moderate edema R-BKA with VAC in place, without seal and leaking.  Left foot with well healed prior 1st to 3 rd toe amputation site.   Neurological: He is alert and oriented to person, place, and time.  Speech clear.  Follows commands without difficulty.  LLE with decreased sensation to light touch from knees down.  Motor: Bilateral upper extremities, left lower extremity: 5/5 proximal distal Right lower extremity: Hip flexion 4/5   Skin: Skin is warm and dry. He is not diaphoretic.  Psychiatric: He has a normal mood and affect. His behavior is normal. Thought content normal.    Results for orders placed or performed during the hospital encounter of 07/12/17 (from the past 24 hour(s))  Glucose, capillary     Status: None   Collection Time: 07/14/17  3:31 PM  Result Value Ref Range   Glucose-Capillary 86 65 - 99 mg/dL  Glucose, capillary     Status: Abnormal   Collection Time: 07/14/17  6:25 PM  Result Value Ref Range   Glucose-Capillary 111 (H) 65 - 99 mg/dL   Comment 1 Notify RN    Comment 2 Document in Chart   Glucose, capillary     Status: Abnormal   Collection Time: 07/14/17  9:25 PM  Result Value Ref Range   Glucose-Capillary 118 (H) 65 - 99 mg/dL  Renal function panel     Status: Abnormal    Collection Time: 07/15/17  6:30 AM  Result Value Ref Range   Sodium 136 135 - 145 mmol/L   Potassium 3.3 (L) 3.5 - 5.1 mmol/L   Chloride 101 101 - 111 mmol/L   CO2 22 22 - 32 mmol/L   Glucose, Bld 108 (H) 65 - 99 mg/dL   BUN 12 6 - 20 mg/dL   Creatinine, Ser 1.73 (H) 0.61 - 1.24 mg/dL   Calcium 8.6 (L) 8.9 - 10.3 mg/dL   Phosphorus 3.6 2.5 - 4.6 mg/dL   Albumin 2.9 (L) 3.5 - 5.0 g/dL   GFR calc non Af Amer 48 (L) >60 mL/min   GFR calc Af Amer 55 (L) >60 mL/min   Anion gap 13 5 - 15  CBC with Differential/Platelet     Status: Abnormal   Collection Time: 07/15/17  6:30 AM  Result Value Ref Range   WBC  8.7 4.0 - 10.5 K/uL   RBC 3.68 (L) 4.22 - 5.81 MIL/uL   Hemoglobin 10.5 (L) 13.0 - 17.0 g/dL   HCT 32.4 (L) 39.0 - 52.0 %   MCV 88.0 78.0 - 100.0 fL   MCH 28.5 26.0 - 34.0 pg   MCHC 32.4 30.0 - 36.0 g/dL   RDW 12.9 11.5 - 15.5 %   Platelets 182 150 - 400 K/uL   Neutrophils Relative % 67 %   Neutro Abs 5.8 1.7 - 7.7 K/uL   Lymphocytes Relative 20 %   Lymphs Abs 1.8 0.7 - 4.0 K/uL   Monocytes Relative 8 %   Monocytes Absolute 0.7 0.1 - 1.0 K/uL   Eosinophils Relative 5 %   Eosinophils Absolute 0.4 0.0 - 0.7 K/uL   Basophils Relative 0 %   Basophils Absolute 0.0 0.0 - 0.1 K/uL  Glucose, capillary     Status: Abnormal   Collection Time: 07/15/17  6:42 AM  Result Value Ref Range   Glucose-Capillary 101 (H) 65 - 99 mg/dL  Glucose, capillary     Status: Abnormal   Collection Time: 07/15/17 11:55 AM  Result Value Ref Range   Glucose-Capillary 107 (H) 65 - 99 mg/dL   No results found.  Assessment/Plan: Diagnosis: Right BKA Labs independently reviewed.  Records reviewed and summated above. Clean amputation daily with soap and water Monitor incision site for signs of infection or impending skin breakdown. Staples to remain in place for 3-4 weeks Stump shrinker, for edema control  Scar mobilization massaging to prevent soft tissue adherence Stump protector during  therapies Prevent flexion contractures by implementing the following:   Encourage prone lying for 20-30 mins per day BID to avoid hip flexion  Contractures if medically appropriate;  Avoid pillow under knees when patient is lying in bed in order to prevent both  knee and hip flexion contractures;  Avoid prolonged sitting Post surgical pain control with oral medication Phantom limb pain control with physical modalities including desensitization techniques (gentle self massage to the residual stump,hot packs if sensation intact, [place]) and mirror therapy, TENS. If ineffective, consider pharmacological treatment for neuropathic pain (e.g gabapentin, pregabalin, amytriptalyine, duloxetine).  When using wheelchair, patient should have knee on amputated side fully extended with board under the seat cushion. Avoid injury to contralateral side  1. Does the need for close, 24 hr/day medical supervision in concert with the patient's rehab needs make it unreasonable for this patient to be served in a less intensive setting? Potentially  2. Co-Morbidities requiring supervision/potential complications: uncontrolled DM with insensate neuropathy (Monitor in accordance with exercise and adjust meds as necessary), polysubstance abuse (counsel), PVD (cont meds), osteomyelitis (wean IV antibiotics when appropriate), hypokalemia (continue to monitor and replete as necessary), ABLA on anemia of chronic disease (transfuse if necessary to ensure appropriate perfusion for increased activity tolerance), CKD (avoid nephrotoxic meds), postop pain (Biofeedback training with therapies to help reduce reliance on opiate pain medications, particularly IV Dilaudid, monitor pain control during therapies, and sedation at rest and titrate to maximum efficacy to ensure participation and gains in therapies), tobacco abuse (counsel) 3. Due to safety, skin/wound care, disease management, pain management and patient education, does the patient  require 24 hr/day rehab nursing? Yes 4. Does the patient require coordinated care of a physician, rehab nurse, PT (1-2 hrs/day, 5 days/week) and OT (1-2 hrs/day, 5 days/week) to address physical and functional deficits in the context of the above medical diagnosis(es)? Potentially Addressing deficits in the following areas: balance,  endurance, locomotion, transferring, bathing, dressing, toileting and psychosocial support 5. Can the patient actively participate in an intensive therapy program of at least 3 hrs of therapy per day at least 5 days per week? Yes 6. The potential for patient to make measurable gains while on inpatient rehab is good 7. Anticipated functional outcomes upon discharge from inpatient rehab are modified independent  with PT, modified independent with OT, n/a with SLP. 8. Estimated rehab length of stay to reach the above functional goals is: 4-6 days. 9. Anticipated D/C setting: Home 10. Anticipated post D/C treatments: HH therapy and Home excercise program 11. Overall Rehab/Functional Prognosis: good  RECOMMENDATIONS: This patient's condition is appropriate for continued rehabilitative care in the following setting: Patient doing fairly well on day of evaluation, anticipate patient will continue to make functional gains as pain improves.  We will continue to follow along and consider admission to CIR if patient has persistent functional deficits once pain improved. Patient has agreed to participate in recommended program. Yes Note that insurance prior authorization may be required for reimbursement for recommended care.  Comment: Rehab Admissions Coordinator to follow up.  Delice Lesch, MD, ABPMR Bary Leriche, PA-C 07/15/2017

## 2017-07-15 NOTE — Progress Notes (Signed)
Patient ID: [name], male   DOB: 05/08/1976, 42 y.o.   MRN: [id number] Postoperative day 1 right transtibial amputation.  Patient still has nausea.  This may be related to his antibiotics.  Can  discontinue antibiotics today from an orthopedic standpoint.  Depending on patient's progress with physical therapy he can either go home with home health therapy or discharge to skilled nursing.

## 2017-07-15 NOTE — Evaluation (Signed)
Physical Therapy Evaluation Patient Details Name: Curtis FettersLamont Clark MRN: [id number] DOB: 03/13/1976 Today's Date: 07/15/2017   History of Present Illness  41 aam poorly controlled DM-a1c 14 in 05/2017, right transmetatarsal amputation 05/15/17 due to osteomyelitis, HTN, HLD, GERD, gastroparesis, stage III chronic kidney disease, prior Polysusbt abuse -cocaine and Opiates presented to ED due to foul-smelling nonhealing right foot wound and nausea and nonbloody emesis; now s/p R transtibial amputation  Clinical Impression   Patient is s/p above surgery resulting in functional limitations due to the deficits listed below (see PT Problem List). Presents with decr functional mobility s/p transtibial amputation; Very motivated to get better, stronger, and walk with a prosthesis; Initiated education re: stretching and strengthening in prep for prosthesis; Participating very well, is young, and has family support; Recommend CIR for intensive rehab and [facility] best prep for prosthesis training; Will place CIR screen;  Patient will benefit from skilled PT to increase their independence and safety with mobility to allow discharge to the venue listed below.       Follow Up Recommendations CIR    Equipment Recommendations  Other (comment)(To be determined; will consider crutches or WC)    Recommendations for Other Services OT consult;Rehab consult(especially if needed for CIR)     Precautions / Restrictions Precautions Precautions: Fall Precaution Comments: Fall risk significantly reduced with use of RW Restrictions Weight Bearing Restrictions: Yes RLE Weight Bearing: Non weight bearing      Mobility  Bed Mobility Overal bed mobility: Needs Assistance Bed Mobility: Supine to Sit     Supine to sit: Supervision     General bed mobility comments: Cues for technique  Transfers Overall transfer level: Needs assistance Equipment used: Rolling walker (2 wheeled) Transfers: Sit to/from Stand Sit  to Stand: Min assist         General transfer comment: Very good rise; min assist to steady with transition of hands from bed to RW  Ambulation/Gait Ambulation/Gait assistance: Min guard Ambulation Distance (Feet): (pivot "hop" steps bed to recliner) Assistive device: Rolling walker (2 wheeled) Gait Pattern/deviations: (Hop to )     General Gait Details: noting tending to hop-almost jump- with support from RW; very energetically taxing; Cues to support body weight in RW for smoother stepping of L foot  Stairs            Wheelchair Mobility    Modified Rankin (Stroke Patients Only)       Balance                                             Pertinent Vitals/Pain Pain Assessment: 0-10 Pain Score: 3  Pain Location: R residual limb Pain Descriptors / Indicators: Aching Pain Intervention(s): Monitored during session;RN gave pain meds during session    Home Living Family/patient expects to be discharged to:: Private residence Living Arrangements: Spouse/significant other Available Help at Discharge: Family;Available PRN/intermittently Type of Home: House Home Access: Ramped entrance     Home Layout: One level Home Equipment: Walker - 2 wheels;Cane - single point;Shower seat      Prior Function Level of Independence: Independent with assistive device(s)         Comments: uses a cane at baseline      Hand Dominance   Dominant Hand: Left    Extremity/Trunk Assessment   Upper Extremity Assessment Upper Extremity Assessment: Overall WFL for tasks assessed  Lower Extremity Assessment Lower Extremity Assessment: RLE deficits/detail RLE Deficits / Details: Painful with any movement RLE, but AROM hip WFL; noting hamstring tightness       Communication   Communication: No difficulties  Cognition Arousal/Alertness: Awake/alert Behavior During Therapy: WFL for tasks assessed/performed Overall Cognitive Status: Within Functional Limits  for tasks assessed                                        General Comments      Exercises Amputee Exercises Gluteal Sets: AROM;Both;5 reps Hip Extension: AROM;5 reps;Standing(with UE support on RW) Knee Flexion: AROM;Right;5 reps Knee Extension: AROM;Right;5 reps Other Exercises Other Exercises: Performed Modified bridging with pillows bolstering under R knee and good hip lift from bed; 5 reps Other Exercises: Performed hamstring stretch RLE seated EOB with 5 breath hold; 3 reps   Assessment/Plan    PT Assessment Patient needs continued PT services  PT Problem List Decreased strength;Decreased range of motion;Decreased activity tolerance;Decreased balance;Decreased mobility;Decreased knowledge of use of DME;Decreased knowledge of precautions;Pain       PT Treatment Interventions DME instruction;Gait training;Stair training;Functional mobility training;Therapeutic activities;Therapeutic exercise;Balance training;Neuromuscular re-education;Patient/family education    PT Goals (Current goals can be found in the Care Plan section)  Acute Rehab PT Goals Patient Stated Goal: get prosthesis and walk PT Goal Formulation: With patient Time For Goal Achievement: 07/29/17 Potential to Achieve Goals: Good    Frequency Min 3X/week   Barriers to discharge        Co-evaluation               AM-PAC PT "6 Clicks" Daily Activity  Outcome Measure Difficulty turning over in bed (including adjusting bedclothes, sheets and blankets)?: A Little Difficulty moving from lying on back to sitting on the side of the bed? : A Little Difficulty sitting down on and standing up from a chair with arms (e.g., wheelchair, bedside commode, etc,.)?: A Lot Help needed moving to and from a bed to chair (including a wheelchair)?: A Lot Help needed walking in hospital room?: A Lot Help needed climbing 3-5 steps with a railing? : A Lot 6 Click Score: 14    End of Session Equipment  Utilized During Treatment: Gait belt Activity Tolerance: Patient tolerated treatment well Patient left: in chair;with call bell/phone within reach;with family/visitor present Nurse Communication: Mobility status PT Visit Diagnosis: Unsteadiness on feet (R26.81);Other abnormalities of gait and mobility (R26.89);Muscle weakness (generalized) (M62.81)    Time: 1610-9604 PT Time Calculation (min) (ACUTE ONLY): 32 min   Charges:   PT Evaluation $PT Eval Moderate Complexity: 1 Mod PT Treatments $Therapeutic Exercise: 8-22 mins   PT G Codes:        Van Clines, PT  Acute Rehabilitation Services Pager [phone] Office [phone]   Levi Aland 07/15/2017, 11:48 AM

## 2017-07-15 NOTE — Plan of Care (Signed)
  Progressing Health Behavior/Discharge Planning: Ability to manage health-related needs will improve 07/15/2017 1128 - Progressing by Quentin CornwallMadison, Deundra Furber, RN Clinical Measurements: Ability to maintain clinical measurements within normal limits will improve 07/15/2017 1128 - Progressing by Quentin CornwallMadison, Daking Westervelt, RN Will remain free from infection 07/15/2017 1128 - Progressing by Quentin CornwallMadison, Caylor Cerino, RN Diagnostic test results will improve 07/15/2017 1128 - Progressing by Quentin CornwallMadison, Zurich Carreno, RN Respiratory complications will improve 07/15/2017 1128 - Progressing by Quentin CornwallMadison, Hisashi Amadon, RN Cardiovascular complication will be avoided 07/15/2017 1128 - Progressing by Quentin CornwallMadison, Demyan Fugate, RN Activity: Risk for activity intolerance will decrease 07/15/2017 1128 - Progressing by Quentin CornwallMadison, Keatyn Luck, RN Nutrition: Adequate nutrition will be maintained 07/15/2017 1128 - Progressing by Quentin CornwallMadison, Dontrail Blackwell, RN Coping: Level of anxiety will decrease 07/15/2017 1128 - Progressing by Quentin CornwallMadison, Angelene Rome, RN Elimination: Will not experience complications related to bowel motility 07/15/2017 1128 - Progressing by Quentin CornwallMadison, Beena Catano, RN Will not experience complications related to urinary retention 07/15/2017 1128 - Progressing by Quentin CornwallMadison, Navaeh Kehres, RN Pain Managment: General experience of comfort will improve 07/15/2017 1128 - Progressing by Quentin CornwallMadison, Manish Ruggiero, RN Safety: Ability to remain free from injury will improve 07/15/2017 1128 - Progressing by Quentin CornwallMadison, Kaizlee Carlino, RN Skin Integrity: Risk for impaired skin integrity will decrease 07/15/2017 1128 - Progressing by Quentin CornwallMadison, Arnella Pralle, RN Education: Knowledge of the prescribed therapeutic regimen will improve 07/15/2017 1128 - Progressing by Quentin CornwallMadison, Syd Newsome, RN Ability to verbalize activity precautions or restrictions will improve 07/15/2017 1128 - Progressing by Quentin CornwallMadison, Jurell Basista, RN Understanding of discharge needs will improve 07/15/2017 1128 - Progressing by Quentin CornwallMadison, Kenlee Vogt, RN Activity: Ability to perform//tolerate  increased activity and mobilize with assistive devices will improve 07/15/2017 1128 - Progressing by Quentin CornwallMadison, Monetta Lick, RN Clinical Measurements: Postoperative complications will be avoided or minimized 07/15/2017 1128 - Progressing by Quentin CornwallMadison, Marquisa Salih, RN Self-Care: Ability to meet self-care needs will improve 07/15/2017 1128 - Progressing by Quentin CornwallMadison, Baird Polinski, RN Self-Concept: Ability to maintain and perform role responsibilities to the fullest extent possible will improve 07/15/2017 1128 - Progressing by Quentin CornwallMadison, Mico Spark, RN Pain Management: Pain level will decrease with appropriate interventions 07/15/2017 1128 - Progressing by Quentin CornwallMadison, Delilah Mulgrew, RN Skin Integrity: Demonstration of wound healing without infection will improve 07/15/2017 1128 - Progressing by Quentin CornwallMadison, Shey Yott, RN

## 2017-07-15 NOTE — Progress Notes (Signed)
ANTIBIOTIC CONSULT NOTE   Pharmacy Consult for Vanco/Zosyn Indication: wound infection  Allergies  Allergen Reactions  . No Known Allergies     Patient Measurements: Height: 6\' 1"  (185.4 cm) Weight: 119 lb 1.6 oz (54 kg) IBW/kg (Calculated) : 79.9 Adjusted Body Weight:    Vital Signs: Temp: 98.7 F (37.1 C) (02/06 0418) Temp Source: Oral (02/06 0418) BP: 150/86 (02/06 0418) Pulse Rate: 79 (02/06 0418) Intake/Output from previous day: 02/05 0701 - 02/06 0700 In: 800 [I.V.:800] Out: 1400 [Urine:1050; Drains:50; Blood:300] Intake/Output from this shift: No intake/output data recorded.  Labs: Recent Labs    07/13/17 0458 07/14/17 0612 07/15/17 0630  WBC 8.9 7.6 8.7  HGB 9.7* 10.5* 10.5*  PLT 187 205 182  CREATININE 1.83* 1.87* 1.73*   Estimated Creatinine Clearance: 43.4 mL/min (A) (by C-G formula based on SCr of 1.73 mg/dL (H)). No results for input(s): VANCOTROUGH, VANCOPEAK, VANCORANDOM, GENTTROUGH, GENTPEAK, GENTRANDOM, TOBRATROUGH, TOBRAPEAK, TOBRARND, AMIKACINPEAK, AMIKACINTROU, AMIKACIN in the last 72 hours.   Microbiology: Recent Results (from the past 720 hour(s))  Surgical pcr screen     Status: Abnormal   Collection Time: 07/14/17  3:51 AM  Result Value Ref Range Status   MRSA, PCR NEGATIVE NEGATIVE Final   Staphylococcus aureus POSITIVE (A) NEGATIVE Final    Comment: (NOTE) The Xpert SA Assay (FDA approved for NASAL specimens in patients 41 years of age and older), is one component of a comprehensive surveillance program. It is not intended to diagnose infection nor to guide or monitor treatment.     Medical History: Past Medical History:  Diagnosis Date  . Acute osteomyelitis, ankle and foot 07/30/2010   Qualifier: Diagnosis of  By: Daiva EvesVan Dam MD, Remi Haggardornelius    . Anemia   . Chronic kidney disease (CKD), stage III (moderate) (HCC)   . Collagen vascular disease (HCC)   . DDD (degenerative disc disease), lumbar   . Dehiscence of amputation stump  (HCC)     dehiscence right transmetetarsal amputation achilles contracture  . Diabetic foot ulcer (HCC) 05/14/2017  . Gastroparesis   . GERD (gastroesophageal reflux disease)   . Headache   . Hiatal hernia   . Hyperlipidemia   . Hypertension   . Pancreatitis   . Peripheral vascular disease (HCC)   . Polysubstance abuse (HCC) 03/08/2016  . Renal insufficiency   . Type II diabetes mellitus (HCC) dx'd ~ 1996  . Vascular disease    poor circulation to left foot    Assessment:  ID: Vanc/Zosyn D#4 for cellulitis /wound infection WBC wnl, afebrile, Dose not charted 2/5 so must delay trough. Ortho ok with d/c abx today. - 2/5: Transtibial amputation  Vanc 2/3>> (dose not charted 2/5) Zosyn 2/3>>   2/5: MRSA PCR +  Goal of Therapy:  Vancomycin trough level 10-15 mcg/ml  Plan:  Vancomycin 1500 mg every 24 hours for now  Zosyn 3.375 gm every 8 hours   [name], PharmD, BCPS Clinical Staff Pharmacist Pager [phone]  [name], [name] 07/15/2017,10:38 AM

## 2017-07-15 NOTE — Social Work (Signed)
CSW is following as the recommendation is Home vs. SNF.   CSW is awaiting PT/OT recommendations and clinical notes.  If patient needs SNF,Insurance Auth will need to be obtained for SNF placement.  CSW will f/u.  Keene BreathPatricia Mallika Sanmiguel, LCSW Clinical Social Worker [phone]

## 2017-07-15 NOTE — Progress Notes (Signed)
PROGRESS NOTE   [name]  WUJ:[id number]    DOB: [birth year]    DOA: 07/12/2017  PCP: Darrow Bussing, MD   I have briefly reviewed patients previous medical records in [facility].  Brief Narrative:  63 aam poorly controlled DM-a1c 14 in 05/2017, right transmetatarsal amputation 05/15/17 due to osteomyelitis, HTN, HLD, GERD, gastroparesis, stage III chronic kidney disease, prior Polysusbt abuse -cocaine and Opiates presented to ED due to foul-smelling nonhealing right foot wound and nausea and nonbloody emesis. Had been seen in Dr. Lajoyce Corners office 1/8 and 1/15 and had declined to go through with transtibial amputation of right leg scheduled in January.   Admitted for suspected right foot osteomyelitis, infected wound and diabetic gastroparesis.  Orthopedics consulted and plan surgery 07/14/17.   Assessment & Plan:   Principal Problem:   Osteomyelitis (HCC) Active Problems:   Diabetes mellitus (HCC)   Nausea & vomiting   CKD (chronic kidney disease), stage III (HCC)   Anemia   Benign essential HTN   Diabetic foot ulcer with osteomyelitis (HCC)   Abdominal pain, lower   GERD (gastroesophageal reflux disease)   Gastroparesis   Acute osteomyelitis (HCC)   Chronic osteomyelitis of right foot (HCC)   1. Nonhealing right foot wound/osteomyelitis: Prior trans-metatarsal amputation.  Empirically placed on IV vancomycin and Zosyn.  Orthopedics/Dr. Lajoyce Corners consulted and plans surgical intervention 07/14/17 and paitent is npo for procedure--await therapy eval and See if candidate for CIR 2. Nausea and vomiting/gastroparesis: Related to poorly controlled long-standing diabetes.  A1c 2/3: 10.7.  Lipase normal.  Treat supportively with antiemetics, PPI --improved and no new changes 3. Poorly controlled DM 2: A1c 10.7.  Reported noncompliance. On admit CBG greater than 400.   Adjust insulins as needed. lantus 30 U at home and 8 u tid--will need low dose lantus 10 on d/c home 4. Essential hypertension:    Continue lisinopril10 qod and amlodipine 2.5 qd--Increase amlodipine to 5mg  on  2/6 as uncontrolled HTN 5. Stage III chronic kidney disease: Baseline creatinine fluctuating but may be in the 1.6-1.9 range. No acute changes 6. Anemia: Likely combination of chronic kidney disease, chronic disease and dilutional.  stable 7. Hypokalemia: -replace with Kdur 40 mg, still low-increase to 40 mg bid  DVT prophylaxis: Heparin Code Status: Full Family Communication: n family today Disposition: DC home pending clinical improvement.   Consultants:  Orthopedics  Procedures:  None  Antimicrobials:  IV vancomycin and Zosyn.   Subjective:  Awake alert npo for procedure Sugar was low las pm 80 so no insulingwas given No cp No further n/v and doesn't typically vomit at home --thinks related to infection  Objective:  Vitals:   07/15/17 0048 07/15/17 0418 07/15/17 0500 07/15/17 1226  BP: (!) 139/97 (!) 150/86  (!) 142/82  Pulse: 72 79  82  Resp: 16 18  18   Temp: 98.7 F (37.1 C) 98.7 F (37.1 C)  98.7 F (37.1 C)  TempSrc: Oral Oral  Oral  SpO2: 98% 100%  100%  Weight:   54 kg (119 lb 1.6 oz)   Height:        Examination:  Awake alert no pallor no ict, EOMI ncat s1 s2 no mrg Chest clear without rales nor rhonchi, trachea midline No SM lymphadenopathy, no other lymphadenopathy in axialla abd soft nt nd no rebound no guard Neuro intact moving 4 limbs equally-no deficit and power 55, reflexes not tested sensory wnl Skin had TT amputation RLE    Data Reviewed: I have personally  reviewed following labs and imaging studies  CBC: Recent Labs  Lab 07/12/17 1047 07/13/17 0458 07/14/17 0612 07/15/17 0630  WBC 9.6 8.9 7.6 8.7  NEUTROABS 7.8*  --   --  5.8  HGB 12.5* 9.7* 10.5* 10.5*  HCT 37.8* 30.3* 33.0* 32.4*  MCV 89.6 89.9 89.2 88.0  PLT 242 187 205 182   Basic Metabolic Panel: Recent Labs  Lab 07/12/17 1047 07/12/17 1805 07/13/17 0458 07/14/17 0612 07/15/17 0630    NA 133*  --  137 137 136  K 4.1  --  3.4* 3.4* 3.3*  CL 95*  --  105 103 101  CO2 23  --  25 22 22   GLUCOSE 408*  --  135* 126* 108*  BUN 18  --  16 12 12   CREATININE 1.94* 1.67* 1.83* 1.87* 1.73*  CALCIUM 9.5  --  8.1* 8.6* 8.6*  PHOS  --   --   --   --  3.6   Liver Function Tests: Recent Labs  Lab 07/12/17 1047 07/15/17 0630  AST 20  --   ALT 17  --   ALKPHOS 104  --   BILITOT 0.8  --   PROT 8.4*  --   ALBUMIN 3.5 2.9*   Coagulation Profile: Recent Labs  Lab 07/13/17 0458  INR 1.21   Cardiac Enzymes: No results for input(s): CKTOTAL, CKMB, CKMBINDEX, TROPONINI in the last 168 hours. HbA1C: Recent Labs    07/12/17 1805  HGBA1C 10.7*   CBG: Recent Labs  Lab 07/14/17 1531 07/14/17 1825 07/14/17 2125 07/15/17 0642 07/15/17 1155  GLUCAP 86 111* 118* 101* 107*    Recent Results (from the past 240 hour(s))  Surgical pcr screen     Status: Abnormal   Collection Time: 07/14/17  3:51 AM  Result Value Ref Range Status   MRSA, PCR NEGATIVE NEGATIVE Final   Staphylococcus aureus POSITIVE (A) NEGATIVE Final    Comment: (NOTE) The Xpert SA Assay (FDA approved for NASAL specimens in patients 41 years of age and older), is one component of a comprehensive surveillance program. It is not intended to diagnose infection nor to guide or monitor treatment.          Radiology Studies: No results found.      Scheduled Meds: . amLODipine  2.5 mg Oral Daily  . atorvastatin  10 mg Oral q1800  . Chlorhexidine Gluconate Cloth  6 each Topical Daily  . docusate sodium  100 mg Oral BID  . insulin aspart  0-20 Units Subcutaneous TID WC  . insulin aspart  0-5 Units Subcutaneous QHS  . lisinopril  10 mg Oral Daily  . mupirocin ointment  1 application Nasal BID  . pantoprazole  40 mg Oral BID  . potassium chloride  40 mEq Oral Daily   Continuous Infusions: . sodium chloride    . lactated ringers 10 mL/hr at 07/14/17 1551  . methocarbamol (ROBAXIN)  IV    .  piperacillin-tazobactam (ZOSYN)  IV Stopped (07/15/17 1213)  . vancomycin 1,500 mg (07/13/17 1645)     LOS: 3 days     Rhetta MuraJai-Gurmukh Mindie Rawdon, MD, FACP, [facility]. [facility] Pager [phone]  If 7PM-7AM, please contact night-coverage [email or web address] Password [email or web address] 07/15/2017, 2:03 PM

## 2017-07-16 LAB — CBC WITH DIFFERENTIAL/PLATELET
BASOS ABS: 0 10*3/uL (ref 0.0–0.1)
Basophils Relative: 0 %
EOS PCT: 4 %
Eosinophils Absolute: 0.4 10*3/uL (ref 0.0–0.7)
HEMATOCRIT: 30.5 % — AB (ref 39.0–52.0)
Hemoglobin: 9.9 g/dL — ABNORMAL LOW (ref 13.0–17.0)
LYMPHS ABS: 1.2 10*3/uL (ref 0.7–4.0)
LYMPHS PCT: 12 %
MCH: 28.6 pg (ref 26.0–34.0)
MCHC: 32.5 g/dL (ref 30.0–36.0)
MCV: 88.2 fL (ref 78.0–100.0)
Monocytes Absolute: 1.1 10*3/uL — ABNORMAL HIGH (ref 0.1–1.0)
Monocytes Relative: 11 %
NEUTROS ABS: 7.4 10*3/uL (ref 1.7–7.7)
Neutrophils Relative %: 73 %
PLATELETS: 188 10*3/uL (ref 150–400)
RBC: 3.46 MIL/uL — ABNORMAL LOW (ref 4.22–5.81)
RDW: 13.2 % (ref 11.5–15.5)
WBC: 10.1 10*3/uL (ref 4.0–10.5)

## 2017-07-16 LAB — RENAL FUNCTION PANEL
ANION GAP: 12 (ref 5–15)
Albumin: 2.6 g/dL — ABNORMAL LOW (ref 3.5–5.0)
BUN: 8 mg/dL (ref 6–20)
CHLORIDE: 99 mmol/L — AB (ref 101–111)
CO2: 25 mmol/L (ref 22–32)
Calcium: 8.5 mg/dL — ABNORMAL LOW (ref 8.9–10.3)
Creatinine, Ser: 1.71 mg/dL — ABNORMAL HIGH (ref 0.61–1.24)
GFR calc Af Amer: 56 mL/min — ABNORMAL LOW (ref >60)
GFR calc non Af Amer: 48 mL/min — ABNORMAL LOW (ref >60)
GLUCOSE: 156 mg/dL — AB (ref 65–99)
PHOSPHORUS: 2.9 mg/dL (ref 2.5–4.6)
POTASSIUM: 3.7 mmol/L (ref 3.5–5.1)
Sodium: 136 mmol/L (ref 135–145)

## 2017-07-16 LAB — GLUCOSE, CAPILLARY
Glucose-Capillary: 153 mg/dL — ABNORMAL HIGH (ref 65–99)
Glucose-Capillary: 147 mg/dL — ABNORMAL HIGH (ref 65–99)
Glucose-Capillary: 175 mg/dL — ABNORMAL HIGH (ref 65–99)
Glucose-Capillary: 96 mg/dL (ref 65–99)

## 2017-07-16 MED ORDER — INSULIN GLARGINE 100 UNIT/ML ~~LOC~~ SOLN
3.0000 [IU] | Freq: Every day | SUBCUTANEOUS | Status: DC
  Administered 2017-07-16 – 2017-07-17 (×2): 3 [IU] via SUBCUTANEOUS
  Filled 2017-07-16 (×2): qty 0.03

## 2017-07-16 NOTE — Care Management Note (Signed)
Case Management Note  Patient Details  Name: Dennie FettersLamont Shammas MRN: [id number] Date of Birth: 02/27/1976  Subjective/Objective:                    Action/Plan:  Received a call from Melissa with CIR , patient and wife want to go home at discharge and not CIR. [facility].   Reviewed chart ABX have been stopped and VAC is disposal VAC . Confirmed with Dr Gloriann LoanSantani .   Spoke with patient explained above . Patient confirmed he does want to go home at discharge , already has walker and would like HHPT through [facility] . [facility]     Expected Discharge Date:                  Expected Discharge Plan:  Home w Home Health Services  In-House Referral:     Discharge planning Services  CM Consult  Post Acute Care Choice:    Choice offered to:     DME Arranged:    DME Agency:     HH Arranged:    HH Agency:     Status of Service:  In process, will continue to follow  If discussed at Long Length of Stay Meetings, dates discussed:    Additional Comments:  Kingsley PlanWile, Ambar Raphael Marie, RN 07/16/2017, 1:51 PM

## 2017-07-16 NOTE — Progress Notes (Signed)
PROGRESS NOTE   [name]  ZOX:[id number]    DOB: 09-Feb-1976    DOA: 07/12/2017  PCP: Darrow Bussing, MD   I have briefly reviewed patients previous medical records in [facility].  Brief Narrative:  72 aam poorly controlled DM-a1c 14 in 05/2017, right transmetatarsal amputation 05/15/17 due to osteomyelitis, HTN, HLD, GERD, gastroparesis, stage III chronic kidney disease, prior Polysusbt abuse -cocaine and Opiates presented to ED due to foul-smelling nonhealing right foot wound and nausea and nonbloody emesis. Had been seen in Dr. Lajoyce Corners office 1/8 and 1/15 and had declined to go through with transtibial amputation of right leg scheduled in January.   Admitted for suspected right foot osteomyelitis, infected wound and diabetic gastroparesis.  Orthopedics consulted and plan surgery 07/14/17.   Assessment & Plan:   Principal Problem:   Osteomyelitis (HCC) Active Problems:   Diabetes mellitus (HCC)   Nausea & vomiting   CKD (chronic kidney disease), stage III (HCC)   Anemia   Benign essential HTN   Diabetic foot ulcer with osteomyelitis (HCC)   Abdominal pain, lower   GERD (gastroesophageal reflux disease)   Gastroparesis   Acute osteomyelitis (HCC)   Chronic osteomyelitis of right foot (HCC)   Chronic anemia   Diabetes mellitus type 2 with complications, uncontrolled (HCC)   Diabetic peripheral neuropathy (HCC)   PVD (peripheral vascular disease) (HCC)   Hypokalemia   Acute blood loss anemia   Post-operative pain   Unilateral complete BKA, right, subsequent encounter (HCC)   1. Nonhealing right foot wound/osteomyelitis: Prior trans-metatarsal amputation.  Stopped abx-->S/p op and doing well-CIR recommended and consult done-await further input 2. Nausea and vomiting/gastroparesis:  A1c 2/3: 10.7.  Lipase normal.  Treat supportively with antiemetics, PPI --improved and no new changes 3. Poorly controlled DM 2: A1c 10.7.  Reported noncompliance. On admit CBG greater than 400.   [name] lantus 30 U at home and 8 u tid--will need low dose lantus 10 on d/c home 4. Essential hypertension:   Continue lisinopril 10 qod and amlodipine 2.5 qd--Increase amlodipine to 5mg  on  2/6 as uncontrolled HTN and improved marginally 2/7 5. Stage III chronic kidney disease: Baseline creatinine fluctuating but may be in the 1.6-1.9 range. No acute changes 6. Anemia: Likely combination of chronic kidney disease, chronic disease and dilutional.  stable 7. Hypokalemia: -replace with Kdur 40 mg, still low-increase to 40 mg bid and improved  DVT prophylaxis: Heparin Code Status: Full Family Communication: no family today Disposition: DC home vs  pending therapy re-eval   Consultants:  Orthopedics cir  Procedures:  None  Antimicrobials:  IV vancomycin and Zosyn.   Subjective: Pleasant in nad No distress Pain mod controlled  Objective:  Vitals:   07/15/17 1226 07/15/17 2028 07/16/17 0415 07/16/17 0842  BP: (!) 142/82 138/87 140/87 (!) 163/100  Pulse: 82 88 94   Resp: 18 17 17    Temp: 98.7 F (37.1 C) 99.1 F (37.3 C) 98.5 F (36.9 C)   TempSrc: Oral Oral Oral   SpO2: 100% 98% 99%   Weight:      Height:        Examination:  eomi ncat No distress  cta b No adde dsound  s1 s 2no m abd soft TT amputation with wound vac   Data Reviewed: I have personally reviewed following labs and imaging studies  CBC: Recent Labs  Lab 07/12/17 1047 07/13/17 0458 07/14/17 0612 07/15/17 0630 07/16/17 0631  WBC 9.6 8.9 7.6 8.7 10.1  NEUTROABS 7.8*  --   --  5.8 7.4  HGB 12.5* 9.7* 10.5* 10.5* 9.9*  HCT 37.8* 30.3* 33.0* 32.4* 30.5*  MCV 89.6 89.9 89.2 88.0 88.2  PLT 242 187 205 182 188   Basic Metabolic Panel: Recent Labs  Lab 07/12/17 1047 07/12/17 1805 07/13/17 0458 07/14/17 0612 07/15/17 0630 07/16/17 0631  NA 133*  --  137 137 136 136  K 4.1  --  3.4* 3.4* 3.3* 3.7  CL 95*  --  105 103 101 99*  CO2 23  --  25 22 22 25   GLUCOSE 408*  --  135* 126* 108* 156*    BUN 18  --  16 12 12 8   CREATININE 1.94* 1.67* 1.83* 1.87* 1.73* 1.71*  CALCIUM 9.5  --  8.1* 8.6* 8.6* 8.5*  PHOS  --   --   --   --  3.6 2.9   Liver Function Tests: Recent Labs  Lab 07/12/17 1047 07/15/17 0630 07/16/17 0631  AST 20  --   --   ALT 17  --   --   ALKPHOS 104  --   --   BILITOT 0.8  --   --   PROT 8.4*  --   --   ALBUMIN 3.5 2.9* 2.6*   Coagulation Profile: Recent Labs  Lab 07/13/17 0458  INR 1.21   Cardiac Enzymes: No results for input(s): CKTOTAL, CKMB, CKMBINDEX, TROPONINI in the last 168 hours. HbA1C: No results for input(s): HGBA1C in the last 72 hours. CBG: Recent Labs  Lab 07/15/17 1155 07/15/17 1634 07/15/17 2116 07/16/17 0640 07/16/17 1148  GLUCAP 107* 135* 150* 153* 147*    Recent Results (from the past 240 hour(s))  Surgical pcr screen     Status: Abnormal   Collection Time: 07/14/17  3:51 AM  Result Value Ref Range Status   MRSA, PCR NEGATIVE NEGATIVE Final   Staphylococcus aureus POSITIVE (A) NEGATIVE Final    Comment: (NOTE) The Xpert SA Assay (FDA approved for NASAL specimens in patients 41 years of age and older), is one component of a comprehensive surveillance program. It is not intended to diagnose infection nor to guide or monitor treatment.          Radiology Studies: No results found.      Scheduled Meds: . amLODipine  5 mg Oral Daily  . atorvastatin  10 mg Oral q1800  . Chlorhexidine Gluconate Cloth  6 each Topical Daily  . docusate sodium  100 mg Oral BID  . insulin aspart  0-20 Units Subcutaneous TID WC  . insulin aspart  0-5 Units Subcutaneous QHS  . lisinopril  10 mg Oral Daily  . mupirocin ointment  1 application Nasal BID  . pantoprazole  40 mg Oral BID  . potassium chloride  40 mEq Oral BID   Continuous Infusions: . sodium chloride    . lactated ringers 10 mL/hr at 07/14/17 1551  . methocarbamol (ROBAXIN)  IV    . piperacillin-tazobactam (ZOSYN)  IV Stopped (07/16/17 1234)  . vancomycin  Stopped (07/15/17 1817)     LOS: 4 days     Rhetta MuraJai-Gurmukh Helina Hullum, MD, FACP, [facility]. [facility] Pager [phone]  If 7PM-7AM, please contact night-coverage [email or web address] Password [email or web address] 07/16/2017, 1:13 PM

## 2017-07-16 NOTE — Progress Notes (Signed)
Inpatient Rehabilitation  Met with patient and spouse at bedside to discuss team's recommendation for IP Rehab.  Shared booklets and answered questions.  Per consult by Dr. Posey Pronto patient die very well on day of eval and could go home, patient and spouse in agreement with this plan.  Patient inquired about home health therapy and nursing, which was passed on to nurse case manager.  Will sign off, call if questions.    Carmelia Roller., CCC/SLP Admission Coordinator  [place]  Cell [phone]

## 2017-07-17 LAB — GLUCOSE, CAPILLARY
Glucose-Capillary: 112 mg/dL — ABNORMAL HIGH (ref 65–99)
Glucose-Capillary: 143 mg/dL — ABNORMAL HIGH (ref 65–99)

## 2017-07-17 MED ORDER — INSULIN GLARGINE 100 UNIT/ML ~~LOC~~ SOLN: 5.0000 [IU] | Freq: Every day | SUBCUTANEOUS | 11 refills | Status: DC

## 2017-07-17 MED ORDER — AMLODIPINE BESYLATE 5 MG PO TABS: 5.0000 mg | ORAL_TABLET | Freq: Every day | ORAL | 0 refills | Status: DC

## 2017-07-17 MED ORDER — INSULIN ASPART 100 UNIT/ML ~~LOC~~ SOLN: 8.0000 [IU] | Freq: Three times a day (TID) | SUBCUTANEOUS | 1 refills | Status: DC

## 2017-07-17 MED ORDER — NOVOLOG FLEXPEN 100 UNIT/ML ~~LOC~~ SOPN: 8.0000 [IU] | PEN_INJECTOR | Freq: Three times a day (TID) | SUBCUTANEOUS | 11 refills | Status: DC

## 2017-07-17 MED ORDER — HYDROCODONE-ACETAMINOPHEN 5-325 MG PO TABS: 1.0000 | ORAL_TABLET | ORAL | 0 refills | Status: DC | PRN

## 2017-07-17 NOTE — Progress Notes (Signed)
Discharge home. HHneeds addressed by case Production designer, theatre/television/filmmanager. Discharge instruction given, no question verbalized.

## 2017-07-17 NOTE — Evaluation (Signed)
Occupational Therapy Evaluation Patient Details Name: Curtis FettersLamont Ned MRN: [id number] DOB: 08/29/1975 Today's Date: 07/17/2017    History of Present Illness 41 aam poorly controlled DM-a1c 14 in 05/2017, right transmetatarsal amputation 05/15/17 due to osteomyelitis, HTN, HLD, GERD, gastroparesis, stage III chronic kidney disease, prior Polysusbt abuse -cocaine and Opiates presented to ED due to foul-smelling nonhealing right foot wound and nausea and nonbloody emesis; now s/p R transtibial amputation   Clinical Impression   Pt admitted with the above diagnoses and presents with below problem list. PTA pt was mod I with ADLs. Pt is currently min guard with functional transfers/mobility, min A with LB dressing. Pt eager to work with therapies and regain functional independence. Would benefit from home health therapies. Pt awaiting d/c home at end of session.     Follow Up Recommendations  Home health OT    Equipment Recommendations  None recommended by OT    Recommendations for Other Services       Precautions / Restrictions Precautions Precautions: Fall Precaution Comments: Fall risk significantly reduced with use of RW Restrictions Weight Bearing Restrictions: Yes RLE Weight Bearing: Non weight bearing      Mobility Bed Mobility Overal bed mobility: Modified Independent Bed Mobility: Supine to Sit;Sit to Supine           General bed mobility comments: increased time and effort  Transfers Overall transfer level: Needs assistance Equipment used: Rolling walker (2 wheeled) Transfers: Sit to/from Stand Sit to Stand: Min guard         General transfer comment: min guard for safety    Balance Overall balance assessment: Needs assistance Sitting-balance support: No upper extremity supported Sitting balance-Leahy Scale: Good     Standing balance support: Bilateral upper extremity supported Standing balance-Leahy Scale: Poor                              ADL either performed or assessed with clinical judgement   ADL Overall ADL's : Needs assistance/impaired Eating/Feeding: Set up;Sitting   Grooming: Set up;Min guard;Sitting;Standing   Upper Body Bathing: Set up;Sitting   Lower Body Bathing: Sit to/from stand;Min guard   Upper Body Dressing : Set up;Sitting   Lower Body Dressing: Minimal assistance;Min guard;Sit to/from stand Lower Body Dressing Details (indicate cue type and reason): assist with clothing management on RLE. min guard for balance with pt completing LB dressing in sit>stand.  Toilet Transfer: Min guard;Ambulation;RW   Toileting- Clothing Manipulation and Hygiene: Set up;Min guard;Sitting/lateral lean;Sit to/from Nurse, children'sstand     Tub/Shower Transfer Details (indicate cue type and reason): sponge bathes only at this time Functional mobility during ADLs: Supervision/safety General ADL Comments: Pt completed bed mobility and UB/LB dressing as detailed above. Discussed strategies for managing LB ADLs. Also briefly discussed mirror box therapy for phantom pain relief.      Vision         Perception     Praxis      Pertinent Vitals/Pain Pain Assessment: Faces Faces Pain Scale: Hurts little more Pain Location: R residual limb with transfers Pain Descriptors / Indicators: Grimacing;Sore Pain Intervention(s): Limited activity within patient's tolerance;Monitored during session;Repositioned     Hand Dominance Left   Extremity/Trunk Assessment Upper Extremity Assessment Upper Extremity Assessment: Overall WFL for tasks assessed   Lower Extremity Assessment Lower Extremity Assessment: Defer to PT evaluation   Cervical / Trunk Assessment Cervical / Trunk Assessment: Normal   Communication Communication Communication: No difficulties   Cognition  Arousal/Alertness: Awake/alert Behavior During Therapy: WFL for tasks assessed/performed Overall Cognitive Status: Within Functional Limits for tasks assessed                                      General Comments       Exercises Exercises: Amputee Amputee Exercises Quad Sets: AROM;Right;10 reps Hip Extension: AROM;10 reps;Sidelying Hip ABduction/ADduction: AROM;Right;10 reps;Sidelying Hip Flexion/Marching: AROM;Right;10 reps;Supine Straight Leg Raises: AROM;Right;10 reps;Supine Other Exercises Other Exercises: bridging X 5   Shoulder Instructions      Home Living Family/patient expects to be discharged to:: Private residence Living Arrangements: Spouse/significant other Available Help at Discharge: Family;Available PRN/intermittently Type of Home: House Home Access: Ramped entrance     Home Layout: One level     Bathroom Shower/Tub: Chief Strategy Officer: Standard     Home Equipment: Environmental consultant - 2 wheels;Cane - single point;Shower seat          Prior Functioning/Environment Level of Independence: Independent with assistive device(s)        Comments: uses a cane at baseline         OT Problem List: Impaired balance (sitting and/or standing);Decreased knowledge of use of DME or AE;Decreased knowledge of precautions;Pain      OT Treatment/Interventions:      OT Goals(Current goals can be found in the care plan section) Acute Rehab OT Goals Patient Stated Goal: get prosthesis and walk  OT Frequency:     Barriers to D/C:            Co-evaluation              AM-PAC PT "6 Clicks" Daily Activity     Outcome Measure Help from another person eating meals?: None Help from another person taking care of personal grooming?: None Help from another person toileting, which includes using toliet, bedpan, or urinal?: A Little Help from another person bathing (including washing, rinsing, drying)?: A Little Help from another person to put on and taking off regular upper body clothing?: None Help from another person to put on and taking off regular lower body clothing?: A Little 6 Click Score: 21    End of Session Equipment Utilized During Treatment: Rolling walker  Activity Tolerance: Patient tolerated treatment well Patient left: in bed;with call bell/phone within reach;Other (comment)(sitting EOB, awaiting d/c)  OT Visit Diagnosis: Unsteadiness on feet (R26.81);Pain                Time: 1129-1140 OT Time Calculation (min): 11 min Charges:  OT General Charges $OT Visit: 1 Visit OT Evaluation $OT Eval Low Complexity: 1 Low G-Codes:       Pilar Grammes 07/17/2017, 12:41 PM

## 2017-07-17 NOTE — Progress Notes (Signed)
Physical Therapy Treatment Patient Details Name: [name] MRN: [id number] DOB: 1976/05/06 Today's Date: 07/17/2017    History of Present Illness 41 aam poorly controlled DM-a1c 14 in 05/2017, right transmetatarsal amputation 05/15/17 due to osteomyelitis, HTN, HLD, GERD, gastroparesis, stage III chronic kidney disease, prior Polysusbt abuse -cocaine and Opiates presented to ED due to foul-smelling nonhealing right foot wound and nausea and nonbloody emesis; now s/p R transtibial amputation    PT Comments    Patient is making progress toward mobility goals and tolerated increased activity well. Pt educated on supine and side lying R LE exercises, positioning to promote optimal ROM, and lying prone periodically throughout the day to stretch hip flexors. Pt does demonstrate tightness in hip flexors this session. Pt will benefit from OT consult and further skilled PT services to maximize independence and safety with mobility.     Follow Up Recommendations  CIR     Equipment Recommendations  Rolling walker with 5" wheels;Wheelchair (measurements PT);Wheelchair cushion (measurements PT);3in1 (PT)    Recommendations for Other Services OT consult     Precautions / Restrictions Precautions Precautions: Fall Restrictions Weight Bearing Restrictions: Yes RLE Weight Bearing: Non weight bearing    Mobility  Bed Mobility Overal bed mobility: Modified Independent Bed Mobility: Supine to Sit           General bed mobility comments: increased time and effort  Transfers Overall transfer level: Needs assistance Equipment used: Rolling walker (2 wheeled) Transfers: Sit to/from Stand Sit to Stand: Min guard         General transfer comment: min guard for safety; cues for safe follow through with transfer and hand placement when returning to sitting  Ambulation/Gait Ambulation/Gait assistance: Min guard Ambulation Distance (Feet): (54ft X2) Assistive device: Rolling walker (2  wheeled) Gait Pattern/deviations: Step-to pattern     General Gait Details: seated rest break needed; cues for posture, proximity to RW, and step lengths   Stairs            Wheelchair Mobility    Modified Rankin (Stroke Patients Only)       Balance Overall balance assessment: Needs assistance   Sitting balance-Leahy Scale: Good     Standing balance support: Bilateral upper extremity supported Standing balance-Leahy Scale: Poor                              Cognition Arousal/Alertness: Awake/alert Behavior During Therapy: WFL for tasks assessed/performed Overall Cognitive Status: Within Functional Limits for tasks assessed                                        Exercises Amputee Exercises Quad Sets: AROM;Right;10 reps Hip Extension: AROM;10 reps;Sidelying Hip ABduction/ADduction: AROM;Right;10 reps;Sidelying Hip Flexion/Marching: AROM;Right;10 reps;Supine Straight Leg Raises: AROM;Right;10 reps;Supine Other Exercises Other Exercises: bridging X 5    General Comments        Pertinent Vitals/Pain Pain Assessment: Faces Faces Pain Scale: Hurts little more Pain Location: R residual limb with exercises Pain Descriptors / Indicators: Grimacing;Sore Pain Intervention(s): Limited activity within patient's tolerance;Monitored during session;Repositioned    Home Living                      Prior Function            PT Goals (current goals can now be found in the care plan  section) Acute Rehab PT Goals Patient Stated Goal: get prosthesis and walk PT Goal Formulation: With patient Time For Goal Achievement: 07/29/17 Potential to Achieve Goals: Good Progress towards PT goals: Progressing toward goals    Frequency    Min 3X/week      PT Plan Discharge plan needs to be updated    Co-evaluation              AM-PAC PT "6 Clicks" Daily Activity  Outcome Measure  Difficulty turning over in bed (including  adjusting bedclothes, sheets and blankets)?: A Little Difficulty moving from lying on back to sitting on the side of the bed? : A Little Difficulty sitting down on and standing up from a chair with arms (e.g., wheelchair, bedside commode, etc,.)?: A Lot Help needed moving to and from a bed to chair (including a wheelchair)?: A Little Help needed walking in hospital room?: A Little Help needed climbing 3-5 steps with a railing? : A Lot 6 Click Score: 16    End of Session Equipment Utilized During Treatment: Gait belt Activity Tolerance: Patient tolerated treatment well Patient left: in chair;with call bell/phone within reach;with family/visitor present Nurse Communication: Mobility status PT Visit Diagnosis: Unsteadiness on feet (R26.81);Other abnormalities of gait and mobility (R26.89);Muscle weakness (generalized) (M62.81)     Time: [id number] PT Time Calculation (min) (ACUTE ONLY): 31 min  Charges:  $Gait Training: 8-22 mins $Therapeutic Exercise: 8-22 mins                    G Codes:       Erline LevineKellyn Shishir Krantz, PTA Pager: [phone]     Carolynne EdouardKellyn R Taniya Dasher 07/17/2017, 11:50 AM

## 2017-07-17 NOTE — Discharge Summary (Signed)
Physician Discharge Summary  [name] FTD:[id number] DOB: Mar 09, 1976 DOA: 07/12/2017  PCP: Lujean Amel, MD  Admit date: 07/12/2017 Discharge date: 07/17/2017  Time spent: 25 minutes  Recommendations for Outpatient Follow-up:  1. Needs outpatient follow-up with the Dr. Sharol Given regarding amputation and further management strategy 2. Refilled Lantus 8 units given insulin FlexPen refills and prescribed amlodipine this admission have discontinued off of lisinopril as felt that contributed to some renal insufficiency, limited prescription for Percocet also given 3. Replacing potassium as well needs labs in about a week  Discharge Diagnoses:  Principal Problem:   Osteomyelitis ([place]) Active Problems:   Diabetes mellitus ([place])   Nausea & vomiting   CKD (chronic kidney disease), stage III (HCC)   Anemia   Benign essential HTN   Diabetic foot ulcer with osteomyelitis (HCC)   Abdominal pain, lower   GERD (gastroesophageal reflux disease)   Gastroparesis   Acute osteomyelitis (HCC)   Chronic osteomyelitis of right foot (HCC)   Chronic anemia   Diabetes mellitus type 2 with complications, uncontrolled ([place])   Diabetic peripheral neuropathy (HCC)   PVD (peripheral vascular disease) (HCC)   Hypokalemia   Acute blood loss anemia   Post-operative pain   Unilateral complete BKA, right, subsequent encounter [facility])   Discharge Condition: Improved  Diet recommendation: Diabetic heart healthy  Filed Weights   07/13/17 0444 07/15/17 0500  Weight: 117.8 kg (259 lb 11.2 oz) 54 kg (119 lb 1.6 oz)    History of present illness:  41 aam poorly controlled DM-a1c 14 in 05/2017, right transmetatarsal amputation 05/15/17 due to osteomyelitis, HTN, HLD, GERD, gastroparesis, stage III chronic kidney disease, prior Polysusbt abuse -cocaine and Opiates presented to ED due to foul-smelling nonhealing right foot wound and nausea and nonbloody emesis. Had been seen in Dr. Sharol Given office 1/8 and 1/15 and had  declined to go through with transtibial amputation of right leg scheduled in January.   Admitted for suspected right foot osteomyelitis, infected wound and diabetic gastroparesis.  Orthopedics consulted and plan surgery 07/14/17.   Hospital Course:  Nonhealing right foot wound/osteomyelitis: Prior trans-metatarsal amputation.  Stopped abx-->S/p op and doing well-patient was not in severe pain and was recommended for home health on discharge Limited prescription of Percocet was given to the patient Nausea and vomiting/gastroparesis:  A1c 2/3: 10.7.  Lipase normal.  Treat supportively with antiemetics, PPI --improved and no new changes Poorly controlled DM 2: A1c 10.7.  Reported noncompliance. On admit CBG greater than 400.  [name] lantus 30 U at home and 8 u tid--see [facility] for changes to medications Essential hypertension:   -Increase amlodipine to 29m on  2/6 as uncontrolled HTN and improved marginally 2/7-lisinopril held this admission Stage III chronic kidney disease: Baseline creatinine fluctuating but may be in the 1.6-1.9 range. No acute changes Anemia: Likely combination of chronic kidney disease, chronic disease and dilutional.  stable Hypokalemia: -replace with Kdur 40 mg, still low-increase to 40 mg bid and improved   Procedures:  Transtibial amputation  Consultations:  Dr. DSharol Givenof orthopedics  Discharge Exam: Vitals:   07/16/17 2130 07/17/17 0630  BP: 129/77 140/82  Pulse: 86 73  Resp: 18 17  Temp: 99.7 F (37.6 C) 98.4 F (36.9 C)  SpO2: 98% 96%    General: Awake alert no distress Cardiovascular: S1-S2 no murmur rub or gallop Respiratory: Clinically clear and and no added sound Wound not examined however stable on Vac  Discharge Instructions    Allergies as of 07/17/2017  Reactions   No Known Allergies       Medication List    STOP taking these medications   doxycycline 100 MG tablet Commonly known as:  VIBRA-TABS   lisinopril 10 MG tablet Commonly known as:   PRINIVIL,ZESTRIL     TAKE these medications   amLODipine 5 MG tablet Commonly known as:  NORVASC Take 1 tablet (5 mg total) by mouth daily. What changed:    medication strength  how much to take   atorvastatin 10 MG tablet Commonly known as:  LIPITOR Take 10 mg by mouth daily.   blood glucose meter kit and supplies Kit Dispense based on patient and insurance preference. Use up to four times daily as directed. (FOR ICD-9 250.00, 250.01).   gabapentin 100 MG capsule Commonly known as:  NEURONTIN Take 2 capsules (200 mg total) by mouth 3 (three) times daily as needed (nerve pain).   HYDROcodone-acetaminophen 5-325 MG tablet Commonly known as:  NORCO/VICODIN Take 1-2 tablets by mouth every 4 (four) hours as needed for moderate pain.   insulin aspart 100 UNIT/ML injection Commonly known as:  novoLOG Inject 8 Units into the skin 3 (three) times daily with meals. What changed:  Another medication with the same name was changed. Make sure you understand how and when to take each.   NOVOLOG FLEXPEN 100 UNIT/ML FlexPen Generic drug:  insulin aspart Inject 8 Units into the skin 3 (three) times daily. With meals, per sliding scale What changed:  how much to take   insulin glargine 100 UNIT/ML injection Commonly known as:  LANTUS Inject 0.05 mLs (5 Units total) into the skin daily. What changed:    how much to take  when to take this  Another medication with the same name was removed. Continue taking this medication, and follow the directions you see here.   Insulin Syringes (Disposable) U-100 0.5 ML Misc Use with lantus and novolog vials.   methocarbamol 500 MG tablet Commonly known as:  ROBAXIN Take 1 tablet (500 mg total) by mouth every 6 (six) hours as needed for muscle spasms.   oxyCODONE-acetaminophen 5-325 MG tablet Commonly known as:  PERCOCET/ROXICET Take 1 tablet by mouth every 8 (eight) hours as needed for severe pain.      Allergies  Allergen Reactions   . No Known Allergies    Follow-up Information    Newt Minion, MD Follow up in 1 week(s).   Specialty:  Orthopedic Surgery Contact information: [place] [address] [place] [facility] up.   Why:  home health  Contact information: [address] [place] [address] [phone]            The results of significant diagnostics from this hospitalization (including imaging, microbiology, ancillary and laboratory) are listed below for reference.    Significant Diagnostic Studies: Portable Chest 1 View  Result Date: 07/13/2017 CLINICAL DATA:  Preoperative examination prior to foot surgery. EXAM: PORTABLE CHEST 1 VIEW COMPARISON:  Chest x-ray of May 07, 2016 FINDINGS: The lungs are adequately inflated and clear. The heart and pulmonary vascularity are normal. The mediastinum is normal in width. There is no pleural effusion. The bony thorax is unremarkable. IMPRESSION: There is no active cardiopulmonary disease. Electronically Signed   By: David  Martinique [name]   On: 07/13/2017 07:19   Dg Foot 2 Views Right  Result Date: 07/12/2017 CLINICAL DATA:  Osteomyelitis. EXAM: RIGHT FOOT -  2 VIEW COMPARISON:  05/29/2017 FINDINGS: Post transmetatarsal amputation of the right foot. Indistinct margin of the cut edge of the second and third metatarsal bones, may be ongoing osteomyelitis. No large gas collections within the soft tissues. IMPRESSION: Status post transmetatarsal amputation of the right foot with indistinct margin of the cut edge of the second and third metatarsal bones, which may represent ongoing osteomyelitis. No evidence of large soft tissue gas collections. Electronically Signed   By: Fidela Salisbury [name]   On: 07/12/2017 13:35    Microbiology: Recent Results (from the past 240 hour(s))  Surgical pcr screen     Status: Abnormal   Collection Time: 07/14/17  3:51 AM  Result Value Ref Range Status    MRSA, PCR NEGATIVE NEGATIVE Final   Staphylococcus aureus POSITIVE (A) NEGATIVE Final    Comment: (NOTE) The Xpert SA Assay (FDA approved for NASAL specimens in patients 20 years of age and older), is one component of a comprehensive surveillance program. It is not intended to diagnose infection nor to guide or monitor treatment.      Labs: Basic Metabolic Panel: Recent Labs  Lab 07/12/17 1047 07/12/17 1805 07/13/17 0458 07/14/17 0612 07/15/17 0630 07/16/17 0631  NA 133*  --  137 137 136 136  K 4.1  --  3.4* 3.4* 3.3* 3.7  CL 95*  --  105 103 101 99*  CO2 23  --  _0 GLUCOSE 408*  --  135* 126* 108* 156*  BUN 18  --  _1 CREATININE 1.94* 1.67* 1.83* 1.87* 1.73* 1.71*  CALCIUM 9.5  --  8.1* 8.6* 8.6* 8.5*  PHOS  --   --   --   --  3.6 2.9   Liver Function Tests: Recent Labs  Lab 07/12/17 1047 07/15/17 0630 07/16/17 0631  AST 20  --   --   ALT 17  --   --   ALKPHOS 104  --   --   BILITOT 0.8  --   --   PROT 8.4*  --   --   ALBUMIN 3.5 2.9* 2.6*   Recent Labs  Lab 07/12/17 1232  LIPASE 25   No results for input(s): AMMONIA in the last 168 hours. CBC: Recent Labs  Lab 07/12/17 1047 07/13/17 0458 07/14/17 0612 07/15/17 0630 07/16/17 0631  WBC 9.6 8.9 7.6 8.7 10.1  NEUTROABS 7.8*  --   --  5.8 7.4  HGB 12.5* 9.7* 10.5* 10.5* 9.9*  HCT 37.8* 30.3* 33.0* 32.4* 30.5*  MCV 89.6 89.9 89.2 88.0 88.2  PLT 242 187 205 182 188   Cardiac Enzymes: No results for input(s): CKTOTAL, CKMB, CKMBINDEX, TROPONINI in the last 168 hours. BNP: BNP (last 3 results) No results for input(s): BNP in the last 8760 hours.  ProBNP (last 3 results) No results for input(s): PROBNP in the last 8760 hours.  CBG: Recent Labs  Lab 07/16/17 0640 07/16/17 1148 07/16/17 1847 07/16/17 2129 07/17/17 0630  GLUCAP 153* 147* 175* 96 112*       Signed:  Nita Sells MD   [facility] 07/17/2017, 9:00 AM

## 2017-07-17 NOTE — Consult Note (Signed)
WOC nurse requested to come to troubleshoot Prevena incisional NPWT dressing prior to patient to DC to home. When arrived it is noted that device will not fully seal down. Removed TRAC pad and sealed area where suction had been attached. Placed new TRAC pad more medially and proximally on the dressing. Seal obtained but is very temperamental with movement. Eventually seal obtained and ABD pad placed to secure TRAC pad in place with ACE wraps.  Explained to patient if machine alarms over the weekend to contact Dr. Audrie Liauda's office to speak with on-call surgeon.  Explained that dressing is over closed incision and that if needed and/or instructed by surgeon if problems with machine arise may have to remove dressing and cover with dry dressing. But only to do this after talking to surgeon on call if issues with machine over the weekend.   Patient reports he has a follow up appointment with Dr. Lajoyce Cornersuda scheduled for Monday Feb. 11th.  Discussed POC with patient and bedside nurse.  Re consult if needed, will not follow at this time. Thanks  Smriti Barkow M.[employer] MSN, RN,CWOCN, CNS, CWON-AP [phone])

## 2017-07-20 ENCOUNTER — Ambulatory Visit (INDEPENDENT_AMBULATORY_CARE_PROVIDER_SITE_OTHER): Payer: PPO | Admitting: Orthopedic Surgery

## 2017-07-20 ENCOUNTER — Encounter (INDEPENDENT_AMBULATORY_CARE_PROVIDER_SITE_OTHER): Payer: Self-pay | Admitting: Orthopedic Surgery

## 2017-07-20 DIAGNOSIS — S88111D Complete traumatic amputation at level between knee and ankle, right lower leg, subsequent encounter: Secondary | ICD-10-CM

## 2017-07-20 DIAGNOSIS — M86671 Other chronic osteomyelitis, right ankle and foot: Secondary | ICD-10-CM

## 2017-07-20 NOTE — Progress Notes (Signed)
Post-Op Visit Note   Patient: [name]           Date of Birth: 23-Aug-1975           MRN: [id number] Visit Date: 07/20/2017 PCP: Darrow Bussing, MD  Chief Complaint: No chief complaint on file.   HPI:  HPI The patient is a 42 year old gentleman seen today nearly 1 week status post right below knee amputation. Wound vac dressing in place. Wound vac absent.  Ortho Exam Incision well approximated with sutures and staples. No open areas. No erythema, drainage odor or sign of infection.  Visit Diagnoses:  1. Unilateral complete BKA, right, subsequent encounter (HCC)     Plan: begin daily dial soap cleansing. Apply dry dressing daily. Given order for prosthesis to [employer]. Will call and begin wearing shrinker daily once obtained.  Follow-Up Instructions: Return in about 2 weeks (around 08/03/2017).   Imaging: No results found.  Orders:  No orders of the defined types were placed in this encounter.  No orders of the defined types were placed in this encounter.    PMFS History: Patient Active Problem List   Diagnosis Date Noted  . Chronic anemia   . Diabetes mellitus type 2 with complications, uncontrolled (HCC)   . Diabetic peripheral neuropathy (HCC)   . PVD (peripheral vascular disease) (HCC)   . Hypokalemia   . Acute blood loss anemia   . Post-operative pain   . Unilateral complete BKA, right, subsequent encounter (HCC)   . Diabetic polyneuropathy associated with type 2 diabetes mellitus (HCC) 07/10/2016  . Polysubstance abuse (HCC) 03/08/2016  . Tobacco abuse 02/27/2016  . Gastroparesis 05/28/2015  . GERD (gastroesophageal reflux disease) 10/01/2014  . Esophageal reflux   . Fever 09/27/2014  . Abdominal pain, lower 09/27/2014  . Abnormal ECG 05/30/2014  . Chest pain 05/30/2014  . Ankle pain 05/30/2014  . Pain in the chest   . Nausea 08/30/2013  . Epigastric abdominal pain 08/30/2013  . Low grade fever 08/30/2013  . Diabetic osteomyelitis b/l toes  03/23/2013  . DM (diabetes mellitus) type II uncontrolled, periph vascular disorder (HCC) 03/23/2013  . CKD (chronic kidney disease), stage III (HCC) 03/23/2013  . Anemia 03/23/2013  . Benign essential HTN 03/23/2013  . AKI (acute kidney injury) (HCC) 03/22/2013  . Viral gastroenteritis 09/17/2012  . Nausea & vomiting 09/16/2012  . Acute pancreatitis 09/16/2012  . Diabetes mellitus (HCC) 09/20/2010  . Onychomycosis 09/20/2010  . METHICILLIN SUSCEPTIBLE STAPH AUREUS SEPTICEMIA 07/30/2010   Past Medical History:  Diagnosis Date  . Acquired contracture of Achilles tendon, right   . Acute osteomyelitis, ankle and foot 07/30/2010   Qualifier: Diagnosis of  By: Daiva Eves MD, Remi Haggard    . Anemia   . Chronic kidney disease (CKD), stage III (moderate) (HCC)   . Chronic osteomyelitis of right foot (HCC)   . Collagen vascular disease (HCC)   . DDD (degenerative disc disease), lumbar   . Dehiscence of amputation stump (HCC)     dehiscence right transmetetarsal amputation achilles contracture  . Diabetic foot ulcer (HCC) 05/14/2017  . Diabetic foot ulcer with osteomyelitis (HCC) 05/12/2013  . Gastroparesis   . GERD (gastroesophageal reflux disease)   . Headache   . Hiatal hernia   . Hyperlipidemia   . Hypertension   . Pancreatitis   . Peripheral vascular disease (HCC)   . Polysubstance abuse (HCC) 03/08/2016  . Renal insufficiency   . Status post transmetatarsal amputation of foot, right (HCC) 07/10/2016  .  Type II diabetes mellitus (HCC) dx'd ~ 1996  . Vascular disease    poor circulation to left foot    Family History  Problem Relation Age of Onset  . Heart attack Father 3552  . Hypertension Sister     Past Surgical History:  Procedure Laterality Date  . AMPUTATION  04/25/2011   Procedure: AMPUTATION DIGIT;  Surgeon: Nadara MustardMarcus V Duda, MD;  [facility];  Service: Orthopedics;  Laterality: Left;  Left foot 3rd toe amputation MTP joint, Gastroc Recession  Achilles Lengthening   .  AMPUTATION Bilateral 03/25/2013   Procedure: AMPUTATION RAY;  Surgeon: Nadara MustardMarcus V Duda, MD;  Location: [facility] OR;  Service: Orthopedics;  Laterality: Bilateral;  Left Great Toe Amputation at  MTP Joint, Right 1st and 2nd Ray Amputation   . AMPUTATION Right 10/03/2014   Procedure: AMPUTATION MIDFOOT;  Surgeon: Nadara MustardMarcus Duda V, MD;  [facility];  Service: Orthopedics;  Laterality: Right;  . AMPUTATION Right 07/14/2017   Procedure: RIGHT BELOW KNEE AMPUTATION;  Surgeon: Nadara Mustarduda, Marcus V, MD;  [facility];  Service: Orthopedics;  Laterality: Right;  . LAPAROSCOPIC CHOLECYSTECTOMY    . STUMP REVISION Right 05/23/2016   Procedure: Revision Right Transmetatarsal Amputation, Right Gastrocnemius Recession;  Surgeon: Nadara MustardMarcus V Duda, MD;  Location: [facility] OR;  Service: Orthopedics;  Laterality: Right;  . STUMP REVISION Right 05/15/2017   Procedure: REVISION RIGHT TRANSMETATARSAL AMPUTATION;  Surgeon: Nadara Mustarduda, Marcus V, MD;  [facility];  Service: Orthopedics;  Laterality: Right;  . TOE AMPUTATION  2012   left foot; great toe and second toe   Social History   Occupational History  . Not on file  Tobacco Use  . Smoking status: Former Smoker    Packs/day: 0.10    Years: 4.00    Pack years: 0.40    Types: Cigarettes    Last attempt to quit: 06/10/2015    Years since quitting: 2.1  . Smokeless tobacco: Never Used  Substance and Sexual Activity  . Alcohol use: No  . Drug use: Yes    Frequency: 10.0 times per week    Types: Marijuana  . Sexual activity: Yes    Birth control/protection: None

## 2017-07-31 ENCOUNTER — Other Ambulatory Visit: Payer: Self-pay

## 2017-07-31 NOTE — Patient Outreach (Signed)
[facility]) Care Management  07/31/2017  Curtis FettersLamont Clark 06/01/1976 [id number]   Telephone Screen  Referral Date: 07/30/17 Referral Source: [facility] Dept. Referral Reason: multiple ER visits, s/p right BKA Insurance: [facility]   Outreach attempt #1 to patient. No answer at present. RN CM left HIPAA compliant voicemail message along with contact info.     Plan: RN CM will make outreach attempt to patient within one business day if no return call from patient.    Antionette Fairyoshanda Halia Franey, RN,BSN,CCM [facility] Care Management Telephonic Care Management Coordinator Direct Phone: [phone] Toll Free: [phone] Fax: [phone]

## 2017-08-03 ENCOUNTER — Other Ambulatory Visit: Payer: Self-pay

## 2017-08-03 ENCOUNTER — Ambulatory Visit (INDEPENDENT_AMBULATORY_CARE_PROVIDER_SITE_OTHER): Payer: PPO | Admitting: Orthopedic Surgery

## 2017-08-03 ENCOUNTER — Encounter (INDEPENDENT_AMBULATORY_CARE_PROVIDER_SITE_OTHER): Payer: Self-pay | Admitting: Orthopedic Surgery

## 2017-08-03 VITALS — Ht 73.0 in | Wt 248.0 lb

## 2017-08-03 DIAGNOSIS — S88111D Complete traumatic amputation at level between knee and ankle, right lower leg, subsequent encounter: Secondary | ICD-10-CM

## 2017-08-03 MED ORDER — OXYCODONE-ACETAMINOPHEN 5-325 MG PO TABS: 1.0000 | ORAL_TABLET | Freq: Three times a day (TID) | ORAL | 0 refills | Status: DC | PRN

## 2017-08-03 NOTE — Patient Outreach (Signed)
[facility]) Care Management  08/03/2017  Curtis FettersLamont Clark 06/24/1975 [id number]   Telephone Screen  Referral Date: 07/30/17 Referral Source: [facility] Dept. Referral Reason: multiple ER visits, s/p right BKA Insurance: [employer]    Outreach attempt #2 to patient. No answer at present. RN CM left HIPAA compliant voicemail message along with contact info. RN CM attempted to contact spouse but no answer and unable to leave voicemail.       Plan: RN CM will send unsuccessful outreach letter to patient, wait 10 business days then make final call attempt and close case if no response from patient.  Antionette Fairyoshanda Branko Steeves, RN,BSN,CCM [facility] Care Management Telephonic Care Management Coordinator Direct Phone: [phone] Toll Free: [phone]: [phone]

## 2017-08-03 NOTE — Progress Notes (Signed)
Office Visit Note   Patient: [name]           Date of Birth: 01/27/1976           MRN: [id number] Visit Date: 08/03/2017              Requested by: Darrow BussingKoirala, Dibas, MD [address] [place], [place] [address] PCP: Darrow BussingKoirala, Dibas, MD  Chief Complaint  Patient presents with  . Right Leg - Routine Post Op    07/14/17 right BKA      HPI: Patient is a 42 year old gentleman 3 weeks status post right transtibial amputation.  He denies any problems.  He does have a follow-up appointment with biotech.  Assessment & Plan: Visit Diagnoses:  1. Unilateral complete BKA, right, subsequent encounter [facility](HCC)     Plan: Follow up with biotech for shrinker and prosthetic fitting.  Sutures and staples harvested today.  Begin Dial soap cleansing continue work on range of motion.  Follow-Up Instructions: Return in about 3 weeks (around 08/24/2017).   Ortho Exam  Patient is alert, oriented, no adenopathy, well-dressed, normal affect, normal respiratory effort. Examination the incision is well-healed there is no redness no cellulitis no drainage no signs of infection.  Patient has full active extension of his knee.  Imaging: No results found. No images are attached to the encounter.  Labs: Lab Results  Component Value Date   HGBA1C 10.7 (H) 07/12/2017   HGBA1C 14.2 (H) 05/13/2017   HGBA1C 6.4 (H) 05/28/2015   ESRSEDRATE 57 (H) 07/12/2017   ESRSEDRATE 51 (H) 05/13/2017   ESRSEDRATE 53 (H) 07/04/2016   CRP 0.9 07/12/2017   CRP 2.3 (H) 05/13/2017   CRP 0.9 07/04/2016   REPTSTATUS 06/03/2017 FINAL 05/29/2017   GRAMSTAIN  07/22/2010    FEW WBC PRESENT,BOTH PMN AND MONONUCLEAR RARE SQUAMOUS EPITHELIAL CELLS PRESENT RARE GRAM POSITIVE COCCI IN PAIRS   GRAMSTAIN  07/22/2010    FEW WBC PRESENT,BOTH PMN AND MONONUCLEAR RARE SQUAMOUS EPITHELIAL CELLS PRESENT RARE GRAM POSITIVE COCCI IN PAIRS   CULT  05/29/2017    NO GROWTH 5 DAYS Performed at [facility],  [address] [place], [place] [address]    LABORGA STAPHYLOCOCCUS AUREUS 07/22/2010    @LABSALLVALUES (HGBA1)@  Body mass index is 32.72 kg/m.  Orders:  No orders of the defined types were placed in this encounter.  No orders of the defined types were placed in this encounter.    Procedures: No procedures performed  Clinical Data: No additional findings.  ROS:  All other systems negative, except as noted in the HPI. Review of Systems  Objective: Vital Signs: Ht 6\' 1"  (1.854 m)   Wt 248 lb (112.5 kg)   BMI 32.72 kg/m   Specialty Comments:  No specialty comments available.  PMFS History: Patient Active Problem List   Diagnosis Date Noted  . Chronic anemia   . Diabetes mellitus type 2 with complications, uncontrolled (HCC)   . Diabetic peripheral neuropathy (HCC)   . PVD (peripheral vascular disease) (HCC)   . Hypokalemia   . Acute blood loss anemia   . Post-operative pain   . Unilateral complete BKA, right, subsequent encounter (HCC)   . Diabetic polyneuropathy associated with type 2 diabetes mellitus (HCC) 07/10/2016  . Polysubstance abuse (HCC) 03/08/2016  . Tobacco abuse 02/27/2016  . Gastroparesis 05/28/2015  . GERD (gastroesophageal reflux disease) 10/01/2014  . Esophageal reflux   . Fever 09/27/2014  . Abdominal pain, lower 09/27/2014  . Abnormal ECG  05/30/2014  . Chest pain 05/30/2014  . Ankle pain 05/30/2014  . Pain in the chest   . Nausea 08/30/2013  . Epigastric abdominal pain 08/30/2013  . Low grade fever 08/30/2013  . Diabetic osteomyelitis b/l toes 03/23/2013  . DM (diabetes mellitus) type II uncontrolled, periph vascular disorder (HCC) 03/23/2013  . CKD (chronic kidney disease), stage III (HCC) 03/23/2013  . Anemia 03/23/2013  . Benign essential HTN 03/23/2013  . AKI (acute kidney injury) (HCC) 03/22/2013  . Viral gastroenteritis 09/17/2012  . Nausea & vomiting 09/16/2012  . Acute pancreatitis 09/16/2012  . Diabetes mellitus (HCC)  09/20/2010  . Onychomycosis 09/20/2010  . METHICILLIN SUSCEPTIBLE STAPH AUREUS SEPTICEMIA 07/30/2010   Past Medical History:  Diagnosis Date  . Acquired contracture of Achilles tendon, right   . Acute osteomyelitis, ankle and foot 07/30/2010   Qualifier: Diagnosis of  By: Daiva Eves MD, Remi Haggard    . Anemia   . Chronic kidney disease (CKD), stage III (moderate) (HCC)   . Chronic osteomyelitis of right foot (HCC)   . Collagen vascular disease (HCC)   . DDD (degenerative disc disease), lumbar   . Dehiscence of amputation stump (HCC)     dehiscence right transmetetarsal amputation achilles contracture  . Diabetic foot ulcer (HCC) 05/14/2017  . Diabetic foot ulcer with osteomyelitis (HCC) 05/12/2013  . Gastroparesis   . GERD (gastroesophageal reflux disease)   . Headache   . Hiatal hernia   . Hyperlipidemia   . Hypertension   . Pancreatitis   . Peripheral vascular disease (HCC)   . Polysubstance abuse (HCC) 03/08/2016  . Renal insufficiency   . Status post transmetatarsal amputation of foot, right (HCC) 07/10/2016  . Type II diabetes mellitus (HCC) dx'd ~ 1996  . Vascular disease    poor circulation to left foot    Family History  Problem Relation Age of Onset  . Heart attack Father 53  . Hypertension Sister     Past Surgical History:  Procedure Laterality Date  . AMPUTATION  04/25/2011   Procedure: AMPUTATION DIGIT;  Surgeon: Nadara Mustard, MD;  Location: [facility] OR;  Service: Orthopedics;  Laterality: Left;  Left foot 3rd toe amputation MTP joint, Gastroc Recession  Achilles Lengthening   . AMPUTATION Bilateral 03/25/2013   Procedure: AMPUTATION RAY;  Surgeon: Nadara Mustard, MD;  Location: [facility] OR;  Service: Orthopedics;  Laterality: Bilateral;  Left Great Toe Amputation at  MTP Joint, Right 1st and 2nd Ray Amputation   . AMPUTATION Right 10/03/2014   Procedure: AMPUTATION MIDFOOT;  Surgeon: Nadara Mustard, MD;  Location: [facility] OR;  Service: Orthopedics;  Laterality: Right;  . AMPUTATION  Right 07/14/2017   Procedure: RIGHT BELOW KNEE AMPUTATION;  Surgeon: Nadara Mustard, MD;  Location: [facility] OR;  Service: Orthopedics;  Laterality: Right;  . LAPAROSCOPIC CHOLECYSTECTOMY    . STUMP REVISION Right 05/23/2016   Procedure: Revision Right Transmetatarsal Amputation, Right Gastrocnemius Recession;  Surgeon: Nadara Mustard, MD;  Location: [facility] OR;  Service: Orthopedics;  Laterality: Right;  . STUMP REVISION Right 05/15/2017   Procedure: REVISION RIGHT TRANSMETATARSAL AMPUTATION;  Surgeon: Nadara Mustard, MD;  Location: [facility] OR;  Service: Orthopedics;  Laterality: Right;  . TOE AMPUTATION  2012   left foot; great toe and second toe   Social History   Occupational History  . Not on file  Tobacco Use  . Smoking status: Former Smoker    Packs/day: 0.10    Years: 4.00    Pack years: 0.40  Types: Cigarettes    Last attempt to quit: 06/10/2015    Years since quitting: 2.1  . Smokeless tobacco: Never Used  Substance and Sexual Activity  . Alcohol use: No  . Drug use: Yes    Frequency: 10.0 times per week    Types: Marijuana  . Sexual activity: Yes    Birth control/protection: None

## 2017-08-03 NOTE — Addendum Note (Signed)
Addended by: Aldean BakerUDA, MARCUS on: 08/03/2017 11:37 AM   Modules accepted: Orders

## 2017-08-13 ENCOUNTER — Other Ambulatory Visit (INDEPENDENT_AMBULATORY_CARE_PROVIDER_SITE_OTHER): Payer: Self-pay | Admitting: Family

## 2017-08-13 ENCOUNTER — Other Ambulatory Visit: Payer: Self-pay

## 2017-08-13 NOTE — Telephone Encounter (Signed)
Does not need further antibiotics 

## 2017-08-13 NOTE — Patient Outreach (Signed)
[facility] Care Management  08/13/2017  [name] 06/06/1976 [id number]    Telephone Screen  Referral Date: 07/30/17 Referral Source: [facility] Dept. Referral Reason: multiple ER visits, s/p right BKA Insurance: [employer]     Outreach attempt #3 to patient and no answer. Multiple attempts to establish contact with patient without success. No response from letter mailed to patient. Case is being closed at this time.       Plan: RN CM will notify [facility] administrative assistant of case status.    Antionette Fairyoshanda Arian Mcquitty, RN,BSN,CCM [facility] Care Management Telephonic Care Management Coordinator Direct Phone: [phone] Toll Free: [phone] Fax: [phone]

## 2017-08-13 NOTE — Telephone Encounter (Signed)
Do you want to refill? 

## 2017-08-14 ENCOUNTER — Ambulatory Visit: Payer: Self-pay

## 2017-08-15 ENCOUNTER — Emergency Department (HOSPITAL_COMMUNITY)
Admission: EM | Admit: 2017-08-15 | Discharge: 2017-08-15 | Disposition: A | Discharge status: Home / Self Care | Payer: PPO | Attending: Emergency Medicine | Admitting: Emergency Medicine

## 2017-08-15 ENCOUNTER — Emergency Department (HOSPITAL_COMMUNITY): Payer: PPO

## 2017-08-15 ENCOUNTER — Encounter (HOSPITAL_COMMUNITY): Payer: Self-pay

## 2017-08-15 DIAGNOSIS — Z794 Long term (current) use of insulin: Secondary | ICD-10-CM | POA: Insufficient documentation

## 2017-08-15 DIAGNOSIS — K2970 Gastritis, unspecified, without bleeding: Secondary | ICD-10-CM | POA: Diagnosis not present

## 2017-08-15 DIAGNOSIS — I129 Hypertensive chronic kidney disease with stage 1 through stage 4 chronic kidney disease, or unspecified chronic kidney disease: Secondary | ICD-10-CM | POA: Insufficient documentation

## 2017-08-15 DIAGNOSIS — Z79899 Other long term (current) drug therapy: Secondary | ICD-10-CM | POA: Insufficient documentation

## 2017-08-15 DIAGNOSIS — R1110 Vomiting, unspecified: Secondary | ICD-10-CM | POA: Diagnosis not present

## 2017-08-15 DIAGNOSIS — Z89511 Acquired absence of right leg below knee: Secondary | ICD-10-CM | POA: Diagnosis not present

## 2017-08-15 DIAGNOSIS — R109 Unspecified abdominal pain: Secondary | ICD-10-CM | POA: Diagnosis not present

## 2017-08-15 DIAGNOSIS — R1032 Left lower quadrant pain: Secondary | ICD-10-CM | POA: Diagnosis not present

## 2017-08-15 DIAGNOSIS — R739 Hyperglycemia, unspecified: Secondary | ICD-10-CM

## 2017-08-15 DIAGNOSIS — N183 Chronic kidney disease, stage 3 (moderate): Secondary | ICD-10-CM | POA: Insufficient documentation

## 2017-08-15 DIAGNOSIS — K5900 Constipation, unspecified: Secondary | ICD-10-CM | POA: Diagnosis not present

## 2017-08-15 DIAGNOSIS — E1165 Type 2 diabetes mellitus with hyperglycemia: Secondary | ICD-10-CM | POA: Insufficient documentation

## 2017-08-15 DIAGNOSIS — Z87891 Personal history of nicotine dependence: Secondary | ICD-10-CM | POA: Insufficient documentation

## 2017-08-15 DIAGNOSIS — R112 Nausea with vomiting, unspecified: Secondary | ICD-10-CM | POA: Insufficient documentation

## 2017-08-15 DIAGNOSIS — R000 Tachycardia, unspecified: Secondary | ICD-10-CM | POA: Diagnosis not present

## 2017-08-15 DIAGNOSIS — R1012 Left upper quadrant pain: Secondary | ICD-10-CM | POA: Diagnosis present

## 2017-08-15 DIAGNOSIS — R197 Diarrhea, unspecified: Secondary | ICD-10-CM | POA: Diagnosis not present

## 2017-08-15 LAB — COMPREHENSIVE METABOLIC PANEL
ALBUMIN: 3.7 g/dL (ref 3.5–5.0)
ALK PHOS: 100 U/L (ref 38–126)
ALT: 16 U/L — AB (ref 17–63)
ANION GAP: 8 (ref 5–15)
AST: 19 U/L (ref 15–41)
BILIRUBIN TOTAL: 0.8 mg/dL (ref 0.3–1.2)
BUN: 17 mg/dL (ref 6–20)
CALCIUM: 9.8 mg/dL (ref 8.9–10.3)
CO2: 30 mmol/L (ref 22–32)
CREATININE: 1.83 mg/dL — AB (ref 0.61–1.24)
Chloride: 99 mmol/L — ABNORMAL LOW (ref 101–111)
GFR calc non Af Amer: 45 mL/min — ABNORMAL LOW (ref >60)
GFR, EST AFRICAN AMERICAN: 52 mL/min — AB (ref >60)
Glucose, Bld: 297 mg/dL — ABNORMAL HIGH (ref 65–99)
Potassium: 4.3 mmol/L (ref 3.5–5.1)
SODIUM: 137 mmol/L (ref 135–145)
TOTAL PROTEIN: 8.4 g/dL — AB (ref 6.5–8.1)

## 2017-08-15 LAB — URINALYSIS, ROUTINE W REFLEX MICROSCOPIC
BACTERIA UA: NONE SEEN
Bilirubin Urine: NEGATIVE
Glucose, UA: >=500 mg/dL — AB
Hgb urine dipstick: NEGATIVE
Ketones, ur: 20 mg/dL — AB
Leukocytes, UA: NEGATIVE
Nitrite: NEGATIVE
SPECIFIC GRAVITY, URINE: 1.021 (ref 1.005–1.030)
SQUAMOUS EPITHELIAL / LPF: NONE SEEN
pH: 7 (ref 5.0–8.0)

## 2017-08-15 LAB — RAPID URINE DRUG SCREEN, HOSP PERFORMED
AMPHETAMINES: NOT DETECTED
Barbiturates: NOT DETECTED
Benzodiazepines: NOT DETECTED
Cocaine: NOT DETECTED
OPIATES: NOT DETECTED
Tetrahydrocannabinol: POSITIVE — AB

## 2017-08-15 LAB — CBC
HCT: 37.5 % — ABNORMAL LOW (ref 39.0–52.0)
Hemoglobin: 12.6 g/dL — ABNORMAL LOW (ref 13.0–17.0)
MCH: 29.3 pg (ref 26.0–34.0)
MCHC: 33.6 g/dL (ref 30.0–36.0)
MCV: 87.2 fL (ref 78.0–100.0)
PLATELETS: 303 10*3/uL (ref 150–400)
RBC: 4.3 MIL/uL (ref 4.22–5.81)
RDW: 14.4 % (ref 11.5–15.5)
WBC: 14 10*3/uL — ABNORMAL HIGH (ref 4.0–10.5)

## 2017-08-15 LAB — CBG MONITORING, ED: Glucose-Capillary: 252 mg/dL — ABNORMAL HIGH (ref 65–99)

## 2017-08-15 LAB — I-STAT CG4 LACTIC ACID, ED: LACTIC ACID, VENOUS: 1.77 mmol/L (ref 0.5–1.9)

## 2017-08-15 LAB — LIPASE, BLOOD: Lipase: 23 U/L (ref 11–51)

## 2017-08-15 MED ORDER — METOCLOPRAMIDE HCL 10 MG PO TABS: 10.0000 mg | ORAL_TABLET | Freq: Four times a day (QID) | ORAL | 0 refills | Status: DC

## 2017-08-15 MED ORDER — HALOPERIDOL LACTATE 5 MG/ML IJ SOLN
5.0000 mg | Freq: Once | INTRAMUSCULAR | Status: AC
  Administered 2017-08-15: 5 mg via INTRAVENOUS
  Filled 2017-08-15: qty 1

## 2017-08-15 MED ORDER — METOCLOPRAMIDE HCL 5 MG/ML IJ SOLN
10.0000 mg | Freq: Once | INTRAMUSCULAR | Status: AC
  Administered 2017-08-15: 10 mg via INTRAVENOUS
  Filled 2017-08-15: qty 2

## 2017-08-15 MED ORDER — SODIUM CHLORIDE 0.9 % IV BOLUS (SEPSIS)
1000.0000 mL | Freq: Once | INTRAVENOUS | Status: AC
  Administered 2017-08-15: 1000 mL via INTRAVENOUS

## 2017-08-15 MED ORDER — SODIUM CHLORIDE 0.9 % IJ SOLN
INTRAMUSCULAR | Status: AC
  Administered 2017-08-15: 10:00:00
  Filled 2017-08-15: qty 50

## 2017-08-15 MED ORDER — HYDROMORPHONE HCL 1 MG/ML IJ SOLN
1.0000 mg | Freq: Once | INTRAMUSCULAR | Status: AC
  Administered 2017-08-15: 1 mg via INTRAVENOUS
  Filled 2017-08-15: qty 1

## 2017-08-15 MED ORDER — PROMETHAZINE HCL 25 MG/ML IJ SOLN
25.0000 mg | Freq: Once | INTRAMUSCULAR | Status: AC
  Administered 2017-08-15: 25 mg via INTRAVENOUS
  Filled 2017-08-15: qty 1

## 2017-08-15 MED ORDER — IOPAMIDOL (ISOVUE-300) INJECTION 61%
INTRAVENOUS | Status: AC
  Administered 2017-08-15: 75 mL
  Filled 2017-08-15: qty 75

## 2017-08-15 MED ORDER — HYDROMORPHONE HCL 1 MG/ML IJ SOLN: 1.0000 mg | Freq: Once | INTRAMUSCULAR | Status: DC

## 2017-08-15 NOTE — ED Notes (Signed)
Bed: WA17 Expected date:  Expected time:  Means of arrival:  Comments: 

## 2017-08-15 NOTE — ED Notes (Signed)
Pt is aware a urine sample is needed; urinal at the bedside.

## 2017-08-15 NOTE — ED Notes (Signed)
PO/fluid challenge successful. 

## 2017-08-15 NOTE — ED Triage Notes (Signed)
[facility] with complaints of left lower/upper abdominal pain. Reports nausea and vomiting over the last three days. 4 of zofran given en route.

## 2017-08-15 NOTE — ED Notes (Signed)
Removed #20 IV from Rt AC, Cath tip intact

## 2017-08-15 NOTE — Discharge Instructions (Signed)
Please continue to take your home medications for your high blood pressure and diabetes.  Take one tablet of Reglan every 6 hours as needed for nausea and/or vomiting.   To resolve constipation, it is important that you drink plenty of water and have a high-fiber diet.  Metamucil is a fiber supplement available over-the-counter and can be used as directed on the label. You can also take a glass and fill it half full of grape juice and half full of prune juice and warm it up in the microwave for 30 seconds and drink it to try and relieve your constipation. Miralax can also be helpful for constipation and is available over the counter.  -For remainder of constipation clean out, take 2 capfuls of Miralax by mouth mixed with 16 ounces of juice or gatorade once. -After constipation clean out, take 1 capful of Miralax by mouth daily mixed with 16 ounces of juice or gatrorade -If you experience diarrhea, you may decreased the daily dose by 1/2 capful as needed.  If your abdominal pain does not improve in the next 2-3 days, please follow-up with your primary care provider.  If you develop new or worsening symptoms, including vomiting despite taking Reglan, blood in your vomit or diarrhea, uncontrollable abdominal pain or if you have severe pain with a fever and/or stop passing gas, please return to the emergency department for reevaluation.

## 2017-08-15 NOTE — ED Provider Notes (Signed)
[facility] DEPT Provider Note   CSN: [id number] Arrival date & time: 08/15/17  0550     History   Chief Complaint Chief Complaint  Patient presents with  . Abdominal Pain    HPI [name] is a 42 y.o. male with a h/o of DM Type II, gastroparesis, CKD stge III, PVD, osteomyelitis, collagen vascular disease, pancreatitis, HTN, and HLD who presents to the emergency department with a chief complaint of abdominal pain.   The patient reports multiple episodes of NBNB emesis, onset 3 days ago with constant, sharp, achy, non-radiating left-sided abdominal pain, nausea, fever, and chills.  He states that his pain is improved with sitting up and worsened with laying flat. He denies diarrhea, dysuria, back pain, penile or testicular tenderness or swelling.  No treatment prior to arrival. Last BM 2 days ago, small and hard. He has been passing flatus without difficulty.   EMS reports the patient stated that he had been noncompliant with his home hypertensive medications and has not checked his blood sugar at home in several days.  The patient states his blood sugar was 300 at around 04:30.   The patient was discharged on 07/17/17 after having a right BKA performed by Dr. Sharol Given to secondary to osteomyelitis. He reports a small amount of drainage from the incision site that has been steadily improving. Next appointment with Dr. Sharol Given is in 2 days.   Surgical history includes cholecystectomy.  The history is provided by the patient. No language interpreter was used.  Abdominal Pain   Associated symptoms include fever, nausea and vomiting. Pertinent negatives include diarrhea, constipation, dysuria, headaches and myalgias.    Past Medical History:  Diagnosis Date  . Acquired contracture of Achilles tendon, right   . Acute osteomyelitis, ankle and foot 07/30/2010   Qualifier: Diagnosis of  By: Tommy Medal MD, Roderic Scarce    . Anemia   . Chronic kidney disease (CKD), stage  III (moderate) (HCC)   . Chronic osteomyelitis of right foot ([place])   . Collagen vascular disease ([place])   . DDD (degenerative disc disease), lumbar   . Dehiscence of amputation stump (HCC)     dehiscence right transmetetarsal amputation achilles contracture  . Diabetic foot ulcer ([place]) 05/14/2017  . Diabetic foot ulcer with osteomyelitis ([place]) 05/12/2013  . Gastroparesis   . GERD (gastroesophageal reflux disease)   . Headache   . Hiatal hernia   . Hyperlipidemia   . Hypertension   . Pancreatitis   . Peripheral vascular disease ([place])   . Polysubstance abuse ([place]) 03/08/2016  . Renal insufficiency   . Status post transmetatarsal amputation of foot, right ([place]) 07/10/2016  . Type II diabetes mellitus ([place]) dx'd ~ 1996  . Vascular disease    poor circulation to left foot    Patient Active Problem List   Diagnosis Date Noted  . Chronic anemia   . Diabetes mellitus type 2 with complications, uncontrolled ([place])   . Diabetic peripheral neuropathy ([place])   . PVD (peripheral vascular disease) ([place])   . Hypokalemia   . Acute blood loss anemia   . Post-operative pain   . Unilateral complete BKA, right, subsequent encounter ([place])   . Diabetic polyneuropathy associated with type 2 diabetes mellitus ([place]) 07/10/2016  . Polysubstance abuse ([place]) 03/08/2016  . Tobacco abuse 02/27/2016  . Gastroparesis 05/28/2015  . GERD (gastroesophageal reflux disease) 10/01/2014  . Esophageal reflux   . Fever 09/27/2014  . Abdominal pain, lower 09/27/2014  . Abnormal ECG  05/30/2014  . Chest pain 05/30/2014  . Ankle pain 05/30/2014  . Pain in the chest   . Nausea 08/30/2013  . Epigastric abdominal pain 08/30/2013  . Low grade fever 08/30/2013  . Diabetic osteomyelitis b/l toes 03/23/2013  . DM (diabetes mellitus) type II uncontrolled, periph vascular disorder ([place]) 03/23/2013  . CKD (chronic kidney disease), stage III ([place]) 03/23/2013  . Anemia 03/23/2013  . Benign essential HTN 03/23/2013  . AKI  (acute kidney injury) ([place]) 03/22/2013  . Viral gastroenteritis 09/17/2012  . Nausea & vomiting 09/16/2012  . Acute pancreatitis 09/16/2012  . Diabetes mellitus ([place]) 09/20/2010  . Onychomycosis 09/20/2010  . METHICILLIN SUSCEPTIBLE STAPH AUREUS SEPTICEMIA 07/30/2010    Past Surgical History:  Procedure Laterality Date  . AMPUTATION  04/25/2011   Procedure: AMPUTATION DIGIT;  Surgeon: Newt Minion, MD;  Location: [facility];  Service: Orthopedics;  Laterality: Left;  Left foot 3rd toe amputation MTP joint, Gastroc Recession  Achilles Lengthening   . AMPUTATION Bilateral 03/25/2013   Procedure: AMPUTATION RAY;  Surgeon: Newt Minion, MD;  Location: [facility];  Service: Orthopedics;  Laterality: Bilateral;  Left Great Toe Amputation at  MTP Joint, Right 1st and 2nd Ray Amputation   . AMPUTATION Right 10/03/2014   Procedure: AMPUTATION MIDFOOT;  Surgeon: Newt Minion, MD;  Location: [facility];  Service: Orthopedics;  Laterality: Right;  . AMPUTATION Right 07/14/2017   Procedure: RIGHT BELOW KNEE AMPUTATION;  Surgeon: Newt Minion, MD;  Location: [facility];  Service: Orthopedics;  Laterality: Right;  . LAPAROSCOPIC CHOLECYSTECTOMY    . STUMP REVISION Right 05/23/2016   Procedure: Revision Right Transmetatarsal Amputation, Right Gastrocnemius Recession;  Surgeon: Newt Minion, MD;  Location: [facility];  Service: Orthopedics;  Laterality: Right;  . STUMP REVISION Right 05/15/2017   Procedure: REVISION RIGHT TRANSMETATARSAL AMPUTATION;  Surgeon: Newt Minion, MD;  Location: [facility];  Service: Orthopedics;  Laterality: Right;  . TOE AMPUTATION  2012   left foot; great toe and second toe       Home Medications    Prior to Admission medications   Medication Sig Start Date End Date Taking? Authorizing Provider  amLODipine (NORVASC) 5 MG tablet Take 1 tablet (5 mg total) by mouth daily. 07/17/17  Yes Nita Sells, MD  atorvastatin (LIPITOR) 10 MG tablet Take 10 mg by mouth daily. 03/29/17  Yes [provider]  blood glucose meter kit and supplies KIT Dispense based on patient and insurance preference. Use up to four times daily as directed. (FOR ICD-9 250.00, 250.01). 05/16/17  Yes Rai, Ripudeep K, MD  insulin aspart (NOVOLOG) 100 UNIT/ML injection Inject 8 Units into the skin 3 (three) times daily with meals. Patient taking differently: Inject 3 Units into the skin 3 (three) times daily with meals.  07/17/17  Yes Nita Sells, MD  insulin glargine (LANTUS) 100 UNIT/ML injection Inject 0.05 mLs (5 Units total) into the skin daily. Patient taking differently: Inject 8 Units into the skin daily.  07/17/17  Yes Nita Sells, MD  Insulin Syringes, Disposable, U-100 0.5 ML MISC Use with lantus and novolog vials. 05/16/17  Yes Rai, Ripudeep K, MD  metoCLOPramide (REGLAN) 10 MG tablet Take 1 tablet (10 mg total) by mouth every 6 (six) hours. 08/15/17   Jai Bear A, PA-C  pantoprazole (PROTONIX) 40 MG tablet Take 1 tablet (40 mg total) by mouth 2 (two) times daily. Patient not taking: Reported on 10/01/2014 09/30/14 05/26/15  Barton Dubois, MD    Family  History Family History  Problem Relation Age of Onset  . Heart attack Father 65  . Hypertension Sister     Social History Social History   Tobacco Use  . Smoking status: Former Smoker    Packs/day: 0.10    Years: 4.00    Pack years: 0.40    Types: Cigarettes    Last attempt to quit: 06/10/2015    Years since quitting: 2.1  . Smokeless tobacco: Never Used  Substance Use Topics  . Alcohol use: No  . Drug use: Yes    Frequency: 10.0 times per week    Types: Marijuana     Allergies   No known allergies   Review of Systems Review of Systems  Constitutional: Positive for chills and fever. Negative for appetite change.  HENT: Negative for congestion.   Eyes: Negative for visual disturbance.  Respiratory: Negative for shortness of breath.   Cardiovascular: Negative for chest pain.  Gastrointestinal: Positive for  abdominal pain, nausea and vomiting. Negative for anal bleeding, constipation and diarrhea.  Genitourinary: Negative for discharge, dysuria, penile pain, penile swelling and testicular pain.  Musculoskeletal: Negative for back pain, myalgias, neck pain and neck stiffness.  Skin: Negative for rash.  Allergic/Immunologic: Positive for immunocompromised state.  Neurological: Negative for weakness, numbness and headaches.  Psychiatric/Behavioral: Negative for confusion.   Physical Exam Updated Vital Signs BP 138/86   Pulse 87   Temp 98.7 F (37.1 C) (Oral)   Resp 20   Ht '6\' 1"'$  (1.854 m)   Wt 112.5 kg (248 lb)   SpO2 98%   BMI 32.72 kg/m   Physical Exam  Constitutional: He is oriented to person, place, and time. He appears well-developed.  Non-toxic appearance. He appears ill.  HENT:  Head: Normocephalic.  Eyes: Conjunctivae are normal.  Neck: Normal range of motion. Neck supple.  Cardiovascular: Normal rate, regular rhythm, normal heart sounds and intact distal pulses. Exam reveals no gallop and no friction rub.  No murmur heard. Pulmonary/Chest: Effort normal and breath sounds normal. No stridor. No respiratory distress. He has no wheezes. He has no rales. He exhibits no tenderness.  Abdominal: Soft. He exhibits no distension and no mass. There is no hepatosplenomegaly, splenomegaly or hepatomegaly. There is tenderness in the left upper quadrant and left lower quadrant. There is no rigidity, no rebound, no guarding, no tenderness at McBurney's point and negative Murphy's sign. No hernia.  No peritoneal signs.  Hypoactive bowel sounds in all 4 quadrants.  Musculoskeletal: Normal range of motion. He exhibits no edema, tenderness or deformity.  Right BKA.  There is a 0.5 cm area of wound dehiscence located along the anterior portion of the incision.  A small amount of serosanguineous drainage is expressed.  No no erythema, warmth, or edema noted to the right stump.   Neurological: He is  alert and oriented to person, place, and time.  Skin: Skin is warm and dry. Capillary refill takes less than 2 seconds.  Psychiatric: His behavior is normal.  Nursing note and vitals reviewed.    ED Treatments / Results  Labs (all labs ordered are listed, but only abnormal results are displayed) Labs Reviewed  CBC - Abnormal; Notable for the following components:      Result Value   WBC 14.0 (*)    Hemoglobin 12.6 (*)    HCT 37.5 (*)    All other components within normal limits  URINALYSIS, ROUTINE W REFLEX MICROSCOPIC - Abnormal; Notable for the following components:   Glucose,  UA >=500 (*)    Ketones, ur 20 (*)    Protein, ur >=300 (*)    All other components within normal limits  RAPID URINE DRUG SCREEN, HOSP PERFORMED - Abnormal; Notable for the following components:   Tetrahydrocannabinol POSITIVE (*)    All other components within normal limits  COMPREHENSIVE METABOLIC PANEL - Abnormal; Notable for the following components:   Chloride 99 (*)    Glucose, Bld 297 (*)    Creatinine, Ser 1.83 (*)    Total Protein 8.4 (*)    ALT 16 (*)    GFR calc non Af Amer 45 (*)    GFR calc Af Amer 52 (*)    All other components within normal limits  CBG MONITORING, ED - Abnormal; Notable for the following components:   Glucose-Capillary 252 (*)    All other components within normal limits  LIPASE, BLOOD  I-STAT CG4 LACTIC ACID, ED    EKG  EKG Interpretation None       Radiology Ct Abdomen Pelvis W Contrast  Result Date: 08/15/2017 CLINICAL DATA:  Left lower and upper abdominal pain. Nausea and vomiting over 3 days. EXAM: CT ABDOMEN AND PELVIS WITH CONTRAST TECHNIQUE: Multidetector CT imaging of the abdomen and pelvis was performed using the standard protocol following bolus administration of intravenous contrast. CONTRAST:  31m ISOVUE-300 IOPAMIDOL (ISOVUE-300) INJECTION 61% COMPARISON:  September 27, 2014 FINDINGS: Lower chest: Mild atelectasis in the bases of the lungs. No focal  infiltrate, nodule, or mass. The lower chest is otherwise normal. Hepatobiliary: Previous cholecystectomy. The liver and portal vein are otherwise unremarkable. Pancreas: Unremarkable. No pancreatic ductal dilatation or surrounding inflammatory changes. Spleen: Normal in size without focal abnormality. Adrenals/Urinary Tract: The adrenal glands are normal. 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 No renal stones or masses. No ureteral stones or dilatation. The bladder is unremarkable. Stomach/Bowel: The stomach and small bowel are normal. There is a transition in the colon near the hepatic flexure. Just proximal to the flexure, the colonic wall is mildly prominent and the colon is fluid-filled in the ascending colon to the hepatic flexure. Beyond this level, the colon is normal in appearance and filled with fecal material. There is no pericolonic stranding. The appendix is normal. Vascular/Lymphatic: No significant vascular findings are present. No enlarged abdominal or pelvic lymph nodes. Reproductive: Prostate is unremarkable. Other: No abdominal wall hernia or abnormality. No abdominopelvic ascites. Musculoskeletal: No acute or significant osseous findings. IMPRESSION: 1. The ascending colon is not distended but is fluid-filled to the level of the hepatic flexure. Distal to this region, the colon is filled with fecal material. The colon wall is mildly prominent at the hepatic flexure. These findings raise the possibility of mild colitis and diarrhea. Alternately, the mild wall prominence could be due to poor distention. Recommend clinical correlation. 2. No other abnormalities. Electronically Signed   By: DDorise BullionIII M.D   On: 08/15/2017 11:02    Procedures Procedures (including critical care time)  Medications Ordered in ED Medications  sodium chloride 0.9 % bolus 1,000 mL (0 mLs Intravenous Stopped 08/15/17 0852)  promethazine (PHENERGAN) injection 25 mg (25 mg  Intravenous Given 08/15/17 0700)  haloperidol lactate (HALDOL) injection 5 mg (5 mg Intravenous Given 08/15/17 0659)  metoCLOPramide (REGLAN) injection 10 mg (10 mg Intravenous Given 08/15/17 0856)  sodium chloride 0.9 % bolus 1,000 mL (0 mLs Intravenous Stopped 08/15/17 1045)  HYDROmorphone (DILAUDID) injection 1 mg (1 mg Intravenous Given 08/15/17 0856)  iopamidol (ISOVUE-300) 61 % injection (75 mLs  Contrast Given 08/15/17 1018)  sodium chloride 0.9 % injection (  Given 08/15/17 1015)     Initial Impression / Assessment and Plan / ED Course  I have reviewed the triage vital signs and the nursing notes.  Pertinent labs & imaging results that were available during my care of the patient were reviewed by me and considered in my medical decision making (see chart for details).  Clinical Course as of Aug 15 1257  Sat Aug 15, 2017  0849 Recheck.  Patient has continued to vomit despite Phenergan. HR 110s. CMP clotted and is being redrawn.  No improvement with the patient's pain after giving haldol. Will order another bolus of IVF and Dilaudid.   [MM]  [phone] Patient recheck.  His pain has significantly improved.  No additional episodes of vomiting after Reglan. He is halfway throughout his one-liter bolus of fluid and continues to be tachycardic in the 100s.  [MM]    Clinical Course User Index [MM] Yalissa Fink A, PA-C    42 year old male with a h/o of DM Type II, gastroparesis, CKD stge III, PVD, osteomyelitis, collagen vascular disease, pancreatitis, HTN, and HLD presenting with abdominal pain, nausea, emesis, fever, and chills x3 days.  He has been noncompliant with checking his glucose levels at home and taking his pretense of medications for the last few days.   Tachycardic in the 100s on arrival. Afebrile with no antipyretics on board. CBG 296 with normal anion gap. WBC 14. UDS is positive for THC. UA with proteinuria and dysuria, which is the patient's baseline. Doubt UTI. Cr 1.83, mildly increased  from baseline.  After 2 IVF, tachycardia has resolved and CBG has improved to 252.  Haldol was given for abdominal pain and Phenergan for nausea, but the patient had no improvement in his pain and he continued to vomit.  He was given Dilaudid, which resolved his pain and Reglan and has had no additional episodes of emesis.  He is tolerating fluids without difficulty.  On physical exam, hypoactive bowel sounds in all 4 quadrants.  There is concern for obstruction given his recent surgery and postoperative opioid use, history of gastroparesis, and constipation. CT A/P concerning for colitis versus constipation.  Clinically, I think this is constipation.  The patient was discussed with Dr. Gilford Raid, attending physician.  Will discharge the patient with Reglan and bowel regimen.  Patient is nontoxic, nonseptic appearing, in no apparent distress.  Patient's pain and other symptoms adequately managed in emergency department.  Fluid bolus given.  Labs, imaging and vitals reviewed.  Patient does not meet the SIRS or Sepsis criteria.  On repeat exam patient does not have a surgical abdomen and there are no peritoneal signs.  No indication of appendicitis, bowel obstruction, bowel perforation, or diverticulitis. VSS. NAD.  I have also discussed reasons to return immediately to the ER and instructions to follow up with his PCP.  Patient expresses understanding and agrees with plan.  Final Clinical Impressions(s) / ED Diagnoses   Final diagnoses:  Hyperglycemia  Constipation, unspecified constipation type    ED Discharge Orders        Ordered    metoCLOPramide (REGLAN) 10 MG tablet  Every 6 hours     08/15/17 1226       Shirley Decamp A, PA-C 08/15/17 1259    Lacretia Leigh, MD 08/15/17 1400

## 2017-08-18 ENCOUNTER — Telehealth (INDEPENDENT_AMBULATORY_CARE_PROVIDER_SITE_OTHER): Payer: Self-pay | Admitting: Orthopedic Surgery

## 2017-08-18 ENCOUNTER — Other Ambulatory Visit (INDEPENDENT_AMBULATORY_CARE_PROVIDER_SITE_OTHER): Payer: Self-pay | Admitting: Orthopedic Surgery

## 2017-08-18 MED ORDER — OXYCODONE-ACETAMINOPHEN 5-325 MG PO TABS: 1.0000 | ORAL_TABLET | Freq: Three times a day (TID) | ORAL | 0 refills | Status: DC | PRN

## 2017-08-18 NOTE — Telephone Encounter (Signed)
rx written

## 2017-08-18 NOTE — Telephone Encounter (Signed)
Pt is s/p a BKA. He was recently in the hospital 4 days ago for ABD pain and constipation belive to be secondary to use of post operative pain medication. Pt is requesting refill on percocet 5/325. Last refill was 08/03/17 #20

## 2017-08-18 NOTE — Telephone Encounter (Signed)
Med refill Percocet 

## 2017-08-19 NOTE — Telephone Encounter (Signed)
Message still states no messages being accepted will try again tomorrow.

## 2017-08-19 NOTE — Telephone Encounter (Signed)
Tried to call pt. Recording states pt not able to accept calls at this time. Will try again later.

## 2017-08-19 NOTE — Telephone Encounter (Signed)
I called pt again and message states that the pt is not accepting calls right now. Will try again later today.

## 2017-08-20 NOTE — Telephone Encounter (Signed)
I tried to reach pt again and pt is not accepting messages. Will try again later.

## 2017-08-24 ENCOUNTER — Encounter (INDEPENDENT_AMBULATORY_CARE_PROVIDER_SITE_OTHER): Payer: Self-pay | Admitting: Orthopedic Surgery

## 2017-08-24 ENCOUNTER — Ambulatory Visit (INDEPENDENT_AMBULATORY_CARE_PROVIDER_SITE_OTHER): Payer: PPO | Admitting: Orthopedic Surgery

## 2017-08-24 VITALS — Ht 73.0 in | Wt 248.0 lb

## 2017-08-24 DIAGNOSIS — S88111D Complete traumatic amputation at level between knee and ankle, right lower leg, subsequent encounter: Secondary | ICD-10-CM

## 2017-08-24 DIAGNOSIS — Z89511 Acquired absence of right leg below knee: Secondary | ICD-10-CM

## 2017-08-24 NOTE — Telephone Encounter (Signed)
Pt has an appt today. I will have him sign out rx at the front desk.

## 2017-08-24 NOTE — Telephone Encounter (Signed)
Called and still unable to accept phone calls. Will try again later.

## 2017-08-24 NOTE — Progress Notes (Signed)
Office Visit Note   Patient: Curtis FettersLamont Malinowski           Date of Birth: 02/03/1976           MRN: [id number] Visit Date: 08/24/2017              Requested by: Darrow BussingKoirala, Dibas, MD [address] [place], [place] [address] PCP: Darrow BussingKoirala, Dibas, MD  Chief Complaint  Patient presents with  . Right Leg - Routine Post Op    07/14/17 right BKA      HPI: Patient is a 42 year old gentleman who presents in follow-up status post right transtibial amputation.  He is working with [employer].  Assessment & Plan: Visit Diagnoses:  1. Unilateral complete BKA, right, subsequent encounter [facility](HCC)     Plan: Patient will follow up with [employer] for prosthetic fitting.  Continue with his stump shrinker.  Follow-Up Instructions: Return in about 4 weeks (around 09/21/2017).   Ortho Exam  Patient is alert, oriented, no adenopathy, well-dressed, normal affect, normal respiratory effort. Examination patient has full extension of his knee.  The residual limb is consolidating nicely.  There is one staple in place this will be removed.  The incision is well approximated there is no redness no cellulitis no drainage no signs of infection.  Imaging: No results found. No images are attached to the encounter.  Labs: Lab Results  Component Value Date   HGBA1C 10.7 (H) 07/12/2017   HGBA1C 14.2 (H) 05/13/2017   HGBA1C 6.4 (H) 05/28/2015   ESRSEDRATE 57 (H) 07/12/2017   ESRSEDRATE 51 (H) 05/13/2017   ESRSEDRATE 53 (H) 07/04/2016   CRP 0.9 07/12/2017   CRP 2.3 (H) 05/13/2017   CRP 0.9 07/04/2016   REPTSTATUS 06/03/2017 FINAL 05/29/2017   GRAMSTAIN  07/22/2010    FEW WBC PRESENT,BOTH PMN AND MONONUCLEAR RARE SQUAMOUS EPITHELIAL CELLS PRESENT RARE GRAM POSITIVE COCCI IN PAIRS   GRAMSTAIN  07/22/2010    FEW WBC PRESENT,BOTH PMN AND MONONUCLEAR RARE SQUAMOUS EPITHELIAL CELLS PRESENT RARE GRAM POSITIVE COCCI IN PAIRS   CULT  05/29/2017    NO GROWTH 5 DAYS Performed at [facility],  [address] [place], [place] [address]    LABORGA STAPHYLOCOCCUS AUREUS 07/22/2010    @LABSALLVALUES (HGBA1)@  Body mass index is 32.72 kg/m.  Orders:  No orders of the defined types were placed in this encounter.  No orders of the defined types were placed in this encounter.    Procedures: No procedures performed  Clinical Data: No additional findings.  ROS:  All other systems negative, except as noted in the HPI. Review of Systems  Objective: Vital Signs: Ht 6\' 1"  (1.854 m)   Wt 248 lb (112.5 kg)   BMI 32.72 kg/m   Specialty Comments:  No specialty comments available.  PMFS History: Patient Active Problem List   Diagnosis Date Noted  . Chronic anemia   . Diabetes mellitus type 2 with complications, uncontrolled (HCC)   . Diabetic peripheral neuropathy (HCC)   . PVD (peripheral vascular disease) (HCC)   . Hypokalemia   . Acute blood loss anemia   . Post-operative pain   . Unilateral complete BKA, right, subsequent encounter (HCC)   . Diabetic polyneuropathy associated with type 2 diabetes mellitus (HCC) 07/10/2016  . Polysubstance abuse (HCC) 03/08/2016  . Tobacco abuse 02/27/2016  . Gastroparesis 05/28/2015  . GERD (gastroesophageal reflux disease) 10/01/2014  . Esophageal reflux   . Fever 09/27/2014  . Abdominal pain, lower 09/27/2014  . Abnormal  ECG 05/30/2014  . Chest pain 05/30/2014  . Ankle pain 05/30/2014  . Pain in the chest   . Nausea 08/30/2013  . Epigastric abdominal pain 08/30/2013  . Low grade fever 08/30/2013  . Diabetic osteomyelitis b/l toes 03/23/2013  . DM (diabetes mellitus) type II uncontrolled, periph vascular disorder (HCC) 03/23/2013  . CKD (chronic kidney disease), stage III (HCC) 03/23/2013  . Anemia 03/23/2013  . Benign essential HTN 03/23/2013  . AKI (acute kidney injury) (HCC) 03/22/2013  . Viral gastroenteritis 09/17/2012  . Nausea & vomiting 09/16/2012  . Acute pancreatitis 09/16/2012  . Diabetes mellitus (HCC)  09/20/2010  . Onychomycosis 09/20/2010  . METHICILLIN SUSCEPTIBLE STAPH AUREUS SEPTICEMIA 07/30/2010   Past Medical History:  Diagnosis Date  . Acquired contracture of Achilles tendon, right   . Acute osteomyelitis, ankle and foot 07/30/2010   Qualifier: Diagnosis of  By: Daiva Eves MD, Remi Haggard    . Anemia   . Chronic kidney disease (CKD), stage III (moderate) (HCC)   . Chronic osteomyelitis of right foot (HCC)   . Collagen vascular disease (HCC)   . DDD (degenerative disc disease), lumbar   . Dehiscence of amputation stump (HCC)     dehiscence right transmetetarsal amputation achilles contracture  . Diabetic foot ulcer (HCC) 05/14/2017  . Diabetic foot ulcer with osteomyelitis (HCC) 05/12/2013  . Gastroparesis   . GERD (gastroesophageal reflux disease)   . Headache   . Hiatal hernia   . Hyperlipidemia   . Hypertension   . Pancreatitis   . Peripheral vascular disease (HCC)   . Polysubstance abuse (HCC) 03/08/2016  . Renal insufficiency   . Status post transmetatarsal amputation of foot, right (HCC) 07/10/2016  . Type II diabetes mellitus (HCC) dx'd ~ 1996  . Vascular disease    poor circulation to left foot    Family History  Problem Relation Age of Onset  . Heart attack Father 27  . Hypertension Sister     Past Surgical History:  Procedure Laterality Date  . AMPUTATION  04/25/2011   Procedure: AMPUTATION DIGIT;  Surgeon: Nadara Mustard, MD;  Location[facility] OR;  Service: Orthopedics;  Laterality: Left;  Left foot 3rd toe amputation MTP joint, Gastroc Recession  Achilles Lengthening   . AMPUTATION Bilateral 03/25/2013   Procedure: AMPUTATION RAY;  Surgeon: Nadara Mustard, MD;  Location: [facility] OR;  Service: Orthopedics;  Laterality: Bilateral;  Left Great Toe Amputation at  MTP Joint, Right 1st and 2nd Ray Amputation   . AMPUTATION Right 10/03/2014   Procedure: AMPUTATION MIDFOOT;  Surgeon: Nadara Mustard, MD;  Location: [facility] OR;  Service: Orthopedics;  Laterality: Right;  . AMPUTATION  Right 07/14/2017   Procedure: RIGHT BELOW KNEE AMPUTATION;  Surgeon: Nadara Mustard, MD;  Location: [facility] OR;  Service: Orthopedics;  Laterality: Right;  . LAPAROSCOPIC CHOLECYSTECTOMY    . STUMP REVISION Right 05/23/2016   Procedure: Revision Right Transmetatarsal Amputation, Right Gastrocnemius Recession;  Surgeon: Nadara Mustard, MD;  Location: [facility] OR;  Service: Orthopedics;  Laterality: Right;  . STUMP REVISION Right 05/15/2017   Procedure: REVISION RIGHT TRANSMETATARSAL AMPUTATION;  Surgeon: Nadara Mustard, MD;  Location: [facility] OR;  Service: Orthopedics;  Laterality: Right;  . TOE AMPUTATION  2012   left foot; great toe and second toe   Social History   Occupational History  . Not on file  Tobacco Use  . Smoking status: Former Smoker    Packs/day: 0.10    Years: 4.00    Pack years: 0.40  Types: Cigarettes    Last attempt to quit: 06/10/2015    Years since quitting: 2.2  . Smokeless tobacco: Never Used  Substance and Sexual Activity  . Alcohol use: No  . Drug use: Yes    Frequency: 10.0 times per week    Types: Marijuana  . Sexual activity: Yes    Birth control/protection: None

## 2017-09-07 DIAGNOSIS — N5319 Other ejaculatory dysfunction: Secondary | ICD-10-CM | POA: Diagnosis not present

## 2017-09-07 DIAGNOSIS — I10 Essential (primary) hypertension: Secondary | ICD-10-CM | POA: Diagnosis not present

## 2017-09-07 DIAGNOSIS — E119 Type 2 diabetes mellitus without complications: Secondary | ICD-10-CM | POA: Diagnosis not present

## 2017-09-08 DIAGNOSIS — N5314 Retrograde ejaculation: Secondary | ICD-10-CM | POA: Diagnosis not present

## 2017-09-08 DIAGNOSIS — N5319 Other ejaculatory dysfunction: Secondary | ICD-10-CM | POA: Diagnosis not present

## 2017-09-15 DIAGNOSIS — N5319 Other ejaculatory dysfunction: Secondary | ICD-10-CM | POA: Diagnosis not present

## 2017-09-15 DIAGNOSIS — N5314 Retrograde ejaculation: Secondary | ICD-10-CM | POA: Diagnosis not present

## 2017-09-15 DIAGNOSIS — Z3141 Encounter for fertility testing: Secondary | ICD-10-CM | POA: Diagnosis not present

## 2017-09-21 ENCOUNTER — Ambulatory Visit (INDEPENDENT_AMBULATORY_CARE_PROVIDER_SITE_OTHER): Payer: PPO | Admitting: Orthopedic Surgery

## 2017-09-21 ENCOUNTER — Encounter (INDEPENDENT_AMBULATORY_CARE_PROVIDER_SITE_OTHER): Payer: Self-pay | Admitting: Orthopedic Surgery

## 2017-09-21 DIAGNOSIS — S88111D Complete traumatic amputation at level between knee and ankle, right lower leg, subsequent encounter: Secondary | ICD-10-CM

## 2017-09-21 DIAGNOSIS — M86671 Other chronic osteomyelitis, right ankle and foot: Secondary | ICD-10-CM

## 2017-09-21 NOTE — Progress Notes (Signed)
Office Visit Note   Patient: [name]           Date of Birth: [birth year]           MRN: [id number] Visit Date: 09/21/2017              Requested by: Darrow Bussing, MD [address] [place], [place] [address] PCP: Darrow Bussing, MD  Chief Complaint  Patient presents with  . Right Leg - Follow-up, Routine Post Op      HPI: Patient is a 42 year old gentleman status post right transtibial amputation with 1 ulcer medially.  Assessment & Plan: Visit Diagnoses:  1. Unilateral complete BKA, right, subsequent encounter (HCC)     Plan: Continue with wound dressing continue with the stump shrinker follow-up in 4 weeks anticipate being fit for a prosthesis in 4 weeks.  Follow-Up Instructions: Return in about 1 month (around 10/19/2017).   Ortho Exam  Patient is alert, oriented, no adenopathy, well-dressed, normal affect, normal respiratory effort. Examination of the residual limb is consolidating nicely.  He has 1 ulcer medially that is 10 x 5 mm and 3 mm deep.  This was debrided there was good bleeding this was touched with silver nitrate and a 2 x 2 gauze and a Band-Aid was applied to the stump shrinker was reapplied there is no redness no cellulitis no drainage no odor no signs of infection.  Imaging: No results found. No images are attached to the encounter.  Labs: Lab Results  Component Value Date   HGBA1C 10.7 (H) 07/12/2017   HGBA1C 14.2 (H) 05/13/2017   HGBA1C 6.4 (H) 05/28/2015   ESRSEDRATE 57 (H) 07/12/2017   ESRSEDRATE 51 (H) 05/13/2017   ESRSEDRATE 53 (H) 07/04/2016   CRP 0.9 07/12/2017   CRP 2.3 (H) 05/13/2017   CRP 0.9 07/04/2016   REPTSTATUS 06/03/2017 FINAL 05/29/2017   GRAMSTAIN  07/22/2010    FEW WBC PRESENT,BOTH PMN AND MONONUCLEAR RARE SQUAMOUS EPITHELIAL CELLS PRESENT RARE GRAM POSITIVE COCCI IN PAIRS   GRAMSTAIN  07/22/2010    FEW WBC PRESENT,BOTH PMN AND MONONUCLEAR RARE SQUAMOUS EPITHELIAL CELLS PRESENT RARE GRAM POSITIVE  COCCI IN PAIRS   CULT  05/29/2017    NO GROWTH 5 DAYS Performed at [facility], [address], [place], [place] [address]    LABORGA STAPHYLOCOCCUS AUREUS 07/22/2010    @LABSALLVALUES (HGBA1)@  There is no height or weight on file to calculate BMI.  Orders:  No orders of the defined types were placed in this encounter.  No orders of the defined types were placed in this encounter.    Procedures: No procedures performed  Clinical Data: No additional findings.  ROS:  All other systems negative, except as noted in the HPI. Review of Systems  Objective: Vital Signs: There were no vitals taken for this visit.  Specialty Comments:  No specialty comments available.  PMFS History: Patient Active Problem List   Diagnosis Date Noted  . Chronic anemia   . Diabetes mellitus type 2 with complications, uncontrolled (HCC)   . Diabetic peripheral neuropathy (HCC)   . PVD (peripheral vascular disease) (HCC)   . Hypokalemia   . Acute blood loss anemia   . Post-operative pain   . Unilateral complete BKA, right, subsequent encounter (HCC)   . Diabetic polyneuropathy associated with type 2 diabetes mellitus (HCC) 07/10/2016  . Polysubstance abuse (HCC) 03/08/2016  . Tobacco abuse 02/27/2016  . Gastroparesis 05/28/2015  . GERD (gastroesophageal reflux disease) 10/01/2014  .  Esophageal reflux   . Fever 09/27/2014  . Abdominal pain, lower 09/27/2014  . Abnormal ECG 05/30/2014  . Chest pain 05/30/2014  . Ankle pain 05/30/2014  . Pain in the chest   . Nausea 08/30/2013  . Epigastric abdominal pain 08/30/2013  . Low grade fever 08/30/2013  . Diabetic osteomyelitis b/l toes 03/23/2013  . DM (diabetes mellitus) type II uncontrolled, periph vascular disorder (HCC) 03/23/2013  . CKD (chronic kidney disease), stage III (HCC) 03/23/2013  . Anemia 03/23/2013  . Benign essential HTN 03/23/2013  . AKI (acute kidney injury) (HCC) 03/22/2013  . Viral gastroenteritis 09/17/2012  .  Nausea & vomiting 09/16/2012  . Acute pancreatitis 09/16/2012  . Diabetes mellitus (HCC) 09/20/2010  . Onychomycosis 09/20/2010  . METHICILLIN SUSCEPTIBLE STAPH AUREUS SEPTICEMIA 07/30/2010   Past Medical History:  Diagnosis Date  . Acquired contracture of Achilles tendon, right   . Acute osteomyelitis, ankle and foot 07/30/2010   Qualifier: Diagnosis of  By: Daiva Eves MD, Remi Haggard    . Anemia   . Chronic kidney disease (CKD), stage III (moderate) (HCC)   . Chronic osteomyelitis of right foot (HCC)   . Collagen vascular disease (HCC)   . DDD (degenerative disc disease), lumbar   . Dehiscence of amputation stump (HCC)     dehiscence right transmetetarsal amputation achilles contracture  . Diabetic foot ulcer (HCC) 05/14/2017  . Diabetic foot ulcer with osteomyelitis (HCC) 05/12/2013  . Gastroparesis   . GERD (gastroesophageal reflux disease)   . Headache   . Hiatal hernia   . Hyperlipidemia   . Hypertension   . Pancreatitis   . Peripheral vascular disease (HCC)   . Polysubstance abuse (HCC) 03/08/2016  . Renal insufficiency   . Status post transmetatarsal amputation of foot, right (HCC) 07/10/2016  . Type II diabetes mellitus (HCC) dx'd ~ 1996  . Vascular disease    poor circulation to left foot    Family History  Problem Relation Age of Onset  . Heart attack Father 42  . Hypertension Sister     Past Surgical History:  Procedure Laterality Date  . AMPUTATION  04/25/2011   Procedure: AMPUTATION DIGIT;  Surgeon: Nadara Mustard, MD;  Location: [facility] OR;  Service: Orthopedics;  Laterality: Left;  Left foot 3rd toe amputation MTP joint, Gastroc Recession  Achilles Lengthening   . AMPUTATION Bilateral 03/25/2013   Procedure: AMPUTATION RAY;  Surgeon: Nadara Mustard, MD;  Location: [facility] OR;  Service: Orthopedics;  Laterality: Bilateral;  Left Great Toe Amputation at  MTP Joint, Right 1st and 2nd Ray Amputation   . AMPUTATION Right 10/03/2014   Procedure: AMPUTATION MIDFOOT;  Surgeon: Nadara Mustard, MD;  Location: [facility] OR;  Service: Orthopedics;  Laterality: Right;  . AMPUTATION Right 07/14/2017   Procedure: RIGHT BELOW KNEE AMPUTATION;  Surgeon: Nadara Mustard, MD;  Location: [facility] OR;  Service: Orthopedics;  Laterality: Right;  . LAPAROSCOPIC CHOLECYSTECTOMY    . STUMP REVISION Right 05/23/2016   Procedure: Revision Right Transmetatarsal Amputation, Right Gastrocnemius Recession;  Surgeon: Nadara Mustard, MD;  Location: [facility] OR;  Service: Orthopedics;  Laterality: Right;  . STUMP REVISION Right 05/15/2017   Procedure: REVISION RIGHT TRANSMETATARSAL AMPUTATION;  Surgeon: Nadara Mustard, MD;  Location: [facility] OR;  Service: Orthopedics;  Laterality: Right;  . TOE AMPUTATION  2012   left foot; great toe and second toe   Social History   Occupational History  . Not on file  Tobacco Use  . Smoking status: Former Smoker  Packs/day: 0.10    Years: 4.00    Pack years: 0.40    Types: Cigarettes    Last attempt to quit: 06/10/2015    Years since quitting: 2.2  . Smokeless tobacco: Never Used  Substance and Sexual Activity  . Alcohol use: No  . Drug use: Yes    Frequency: 10.0 times per week    Types: Marijuana  . Sexual activity: Yes    Birth control/protection: None

## 2017-09-24 DIAGNOSIS — I10 Essential (primary) hypertension: Secondary | ICD-10-CM | POA: Diagnosis not present

## 2017-09-24 DIAGNOSIS — S88111D Complete traumatic amputation at level between knee and ankle, right lower leg, subsequent encounter: Secondary | ICD-10-CM | POA: Diagnosis not present

## 2017-09-24 DIAGNOSIS — E785 Hyperlipidemia, unspecified: Secondary | ICD-10-CM | POA: Diagnosis not present

## 2017-09-24 DIAGNOSIS — I739 Peripheral vascular disease, unspecified: Secondary | ICD-10-CM | POA: Diagnosis not present

## 2017-09-24 DIAGNOSIS — N183 Chronic kidney disease, stage 3 (moderate): Secondary | ICD-10-CM | POA: Diagnosis not present

## 2017-09-24 DIAGNOSIS — E118 Type 2 diabetes mellitus with unspecified complications: Secondary | ICD-10-CM | POA: Diagnosis not present

## 2017-09-24 DIAGNOSIS — D62 Acute posthemorrhagic anemia: Secondary | ICD-10-CM | POA: Diagnosis not present

## 2017-09-25 ENCOUNTER — Other Ambulatory Visit: Payer: Self-pay | Admitting: Internal Medicine

## 2017-09-25 DIAGNOSIS — I739 Peripheral vascular disease, unspecified: Secondary | ICD-10-CM

## 2017-10-01 ENCOUNTER — Inpatient Hospital Stay
Admission: RE | Admit: 2017-10-01 | Discharge: 2017-10-01 | Disposition: A | Discharge status: Home / Self Care | Payer: PPO | Source: Ambulatory Visit | Attending: Internal Medicine | Admitting: Internal Medicine

## 2017-10-19 DIAGNOSIS — N5314 Retrograde ejaculation: Secondary | ICD-10-CM | POA: Diagnosis not present

## 2017-10-19 DIAGNOSIS — E119 Type 2 diabetes mellitus without complications: Secondary | ICD-10-CM | POA: Diagnosis not present

## 2017-10-21 DIAGNOSIS — I10 Essential (primary) hypertension: Secondary | ICD-10-CM | POA: Diagnosis not present

## 2017-10-21 DIAGNOSIS — N183 Chronic kidney disease, stage 3 (moderate): Secondary | ICD-10-CM | POA: Diagnosis not present

## 2017-10-21 DIAGNOSIS — E785 Hyperlipidemia, unspecified: Secondary | ICD-10-CM | POA: Diagnosis not present

## 2017-10-21 DIAGNOSIS — E118 Type 2 diabetes mellitus with unspecified complications: Secondary | ICD-10-CM | POA: Diagnosis not present

## 2017-10-22 ENCOUNTER — Encounter (INDEPENDENT_AMBULATORY_CARE_PROVIDER_SITE_OTHER): Payer: Self-pay | Admitting: Orthopedic Surgery

## 2017-10-22 ENCOUNTER — Ambulatory Visit (INDEPENDENT_AMBULATORY_CARE_PROVIDER_SITE_OTHER): Payer: PPO | Admitting: Orthopedic Surgery

## 2017-10-22 VITALS — Ht 73.0 in | Wt 248.0 lb

## 2017-10-22 DIAGNOSIS — Z89511 Acquired absence of right leg below knee: Secondary | ICD-10-CM

## 2017-10-22 DIAGNOSIS — S88111D Complete traumatic amputation at level between knee and ankle, right lower leg, subsequent encounter: Secondary | ICD-10-CM

## 2017-10-22 DIAGNOSIS — E118 Type 2 diabetes mellitus with unspecified complications: Secondary | ICD-10-CM | POA: Diagnosis not present

## 2017-10-22 NOTE — Progress Notes (Signed)
Office Visit Note   Patient: [name]           Date of Birth: 07-Dec-1975           MRN: [id number] Visit Date: 10/22/2017              Requested by: Darrow Bussing, MD [address] [place], [place] [address] PCP: Darrow Bussing, MD  Chief Complaint  Patient presents with  . Right Leg - Follow-up    07/14/17 right BKA      HPI: Patient is a 42 year old gentleman 3 months status post right transtibial amputation he has a few small scabs no open wounds he has just been cast for his prosthesis with [employer] he is wearing his stump shrinker.  Assessment & Plan: Visit Diagnoses:  1. Unilateral complete BKA, right, subsequent encounter [facility])     Plan: Patient will follow up with [employer] for gait training once he has his prosthesis reevaluate in 2 months to see how he is doing.  Patient has made excellent progress.  Follow-Up Instructions: Return in about 2 months (around 12/22/2017).   Ortho Exam  Patient is alert, oriented, no adenopathy, well-dressed, normal affect, normal respiratory effort. Examination the limb is well consolidated there is no open wounds no cellulitis no drainage no signs of infection.  He has 2 small scabs less than 10 mm in diameter medially with no signs of any complications.  Patient is very pleased with his progress.  Imaging: No results found. No images are attached to the encounter.  Labs: Lab Results  Component Value Date   HGBA1C 10.7 (H) 07/12/2017   HGBA1C 14.2 (H) 05/13/2017   HGBA1C 6.4 (H) 05/28/2015   ESRSEDRATE 57 (H) 07/12/2017   ESRSEDRATE 51 (H) 05/13/2017   ESRSEDRATE 53 (H) 07/04/2016   CRP 0.9 07/12/2017   CRP 2.3 (H) 05/13/2017   CRP 0.9 07/04/2016   REPTSTATUS 06/03/2017 FINAL 05/29/2017   GRAMSTAIN  07/22/2010    FEW WBC PRESENT,BOTH PMN AND MONONUCLEAR RARE SQUAMOUS EPITHELIAL CELLS PRESENT RARE GRAM POSITIVE COCCI IN PAIRS   GRAMSTAIN  07/22/2010    FEW WBC PRESENT,BOTH PMN AND MONONUCLEAR RARE  SQUAMOUS EPITHELIAL CELLS PRESENT RARE GRAM POSITIVE COCCI IN PAIRS   CULT  05/29/2017    NO GROWTH 5 DAYS Performed at [facility], [address] [place], [place] [address]    [facility] STAPHYLOCOCCUS AUREUS 07/22/2010     Lab Results  Component Value Date   ALBUMIN 3.7 08/15/2017   ALBUMIN 2.6 (L) 07/16/2017   ALBUMIN 2.9 (L) 07/15/2017   PREALBUMIN 16.4 (L) 05/13/2017   PREALBUMIN 22.5 02/27/2016    Body mass index is 32.72 kg/m.  Orders:  No orders of the defined types were placed in this encounter.  No orders of the defined types were placed in this encounter.    Procedures: No procedures performed  Clinical Data: No additional findings.  ROS:  All other systems negative, except as noted in the HPI. Review of Systems  Objective: Vital Signs: Ht  (1.854 m)   Wt 248 lb (112.5 kg)   BMI 32.72 kg/m   Specialty Comments:  No specialty comments available.  PMFS History: Patient Active Problem List   Diagnosis Date Noted  . Chronic anemia   . Diabetes mellitus type 2 with complications, uncontrolled (HCC)   . Diabetic peripheral neuropathy (HCC)   . PVD (peripheral vascular disease) (HCC)   . Hypokalemia   . Acute blood loss anemia   .  Post-operative pain   . Unilateral complete BKA, right, subsequent encounter (HCC)   . Diabetic polyneuropathy associated with type 2 diabetes mellitus (HCC) 07/10/2016  . Polysubstance abuse (HCC) 03/08/2016  . Tobacco abuse 02/27/2016  . Gastroparesis 05/28/2015  . GERD (gastroesophageal reflux disease) 10/01/2014  . Esophageal reflux   . Fever 09/27/2014  . Abdominal pain, lower 09/27/2014  . Abnormal ECG 05/30/2014  . Chest pain 05/30/2014  . Ankle pain 05/30/2014  . Pain in the chest   . Nausea 08/30/2013  . Epigastric abdominal pain 08/30/2013  . Low grade fever 08/30/2013  . Diabetic osteomyelitis b/l toes 03/23/2013  . DM (diabetes mellitus) type II uncontrolled, periph vascular disorder (HCC)  03/23/2013  . CKD (chronic kidney disease), stage III (HCC) 03/23/2013  . Anemia 03/23/2013  . Benign essential HTN 03/23/2013  . AKI (acute kidney injury) (HCC) 03/22/2013  . Viral gastroenteritis 09/17/2012  . Nausea & vomiting 09/16/2012  . Acute pancreatitis 09/16/2012  . Diabetes mellitus (HCC) 09/20/2010  . Onychomycosis 09/20/2010  . METHICILLIN SUSCEPTIBLE STAPH AUREUS SEPTICEMIA 07/30/2010   Past Medical History:  Diagnosis Date  . Acquired contracture of Achilles tendon, right   . Acute osteomyelitis, ankle and foot 07/30/2010   Qualifier: Diagnosis of  By: Daiva Eves MD, Remi Haggard    . Anemia   . Chronic kidney disease (CKD), stage III (moderate) (HCC)   . Chronic osteomyelitis of right foot (HCC)   . Collagen vascular disease (HCC)   . DDD (degenerative disc disease), lumbar   . Dehiscence of amputation stump (HCC)     dehiscence right transmetetarsal amputation achilles contracture  . Diabetic foot ulcer (HCC) 05/14/2017  . Diabetic foot ulcer with osteomyelitis (HCC) 05/12/2013  . Gastroparesis   . GERD (gastroesophageal reflux disease)   . Headache   . Hiatal hernia   . Hyperlipidemia   . Hypertension   . Pancreatitis   . Peripheral vascular disease (HCC)   . Polysubstance abuse (HCC) 03/08/2016  . Renal insufficiency   . Status post transmetatarsal amputation of foot, right (HCC) 07/10/2016  . Type II diabetes mellitus (HCC) dx'd ~ 1996  . Vascular disease    poor circulation to left foot    Family History  Problem Relation Age of Onset  . Heart attack Father 28  . Hypertension Sister     Past Surgical History:  Procedure Laterality Date  . AMPUTATION  04/25/2011   Procedure: AMPUTATION DIGIT;  Surgeon: Nadara Mustard, MD;  Location: [facility] OR;  Service: Orthopedics;  Laterality: Left;  Left foot 3rd toe amputation MTP joint, Gastroc Recession  Achilles Lengthening   . AMPUTATION Bilateral 03/25/2013   Procedure: AMPUTATION RAY;  Surgeon: Nadara Mustard, MD;   Location: [facility] OR;  Service: Orthopedics;  Laterality: Bilateral;  Left Great Toe Amputation at  MTP Joint, Right 1st and 2nd Ray Amputation   . AMPUTATION Right 10/03/2014   Procedure: AMPUTATION MIDFOOT;  Surgeon: Nadara Mustard, MD;  Location: [facility] OR;  Service: Orthopedics;  Laterality: Right;  . AMPUTATION Right 07/14/2017   Procedure: RIGHT BELOW KNEE AMPUTATION;  Surgeon: Nadara Mustard, MD;  [facility] OR;  Service: Orthopedics;  Laterality: Right;  . LAPAROSCOPIC CHOLECYSTECTOMY    . STUMP REVISION Right 05/23/2016   Procedure: Revision Right Transmetatarsal Amputation, Right Gastrocnemius Recession;  Surgeon: Nadara Mustard, MD;  Location: [facility] OR;  Service: Orthopedics;  Laterality: Right;  . STUMP REVISION Right 05/15/2017   Procedure: REVISION RIGHT TRANSMETATARSAL AMPUTATION;  Surgeon: Aldean Baker  V, MD;  Location: [facility] OR;  Service: Orthopedics;  Laterality: Right;  . TOE AMPUTATION  2012   left foot; great toe and second toe   Social History   Occupational History  . Not on file  Tobacco Use  . Smoking status: Former Smoker    Packs/day: 0.10    Years: 4.00    Pack years: 0.40    Types: Cigarettes    Last attempt to quit: 06/10/2015    Years since quitting: 2.3  . Smokeless tobacco: Never Used  Substance and Sexual Activity  . Alcohol use: No  . Drug use: Yes    Frequency: 10.0 times per week    Types: Marijuana  . Sexual activity: Yes    Birth control/protection: None

## 2017-12-17 DIAGNOSIS — E785 Hyperlipidemia, unspecified: Secondary | ICD-10-CM | POA: Diagnosis not present

## 2017-12-17 DIAGNOSIS — N183 Chronic kidney disease, stage 3 (moderate): Secondary | ICD-10-CM | POA: Diagnosis not present

## 2017-12-17 DIAGNOSIS — Z125 Encounter for screening for malignant neoplasm of prostate: Secondary | ICD-10-CM | POA: Diagnosis not present

## 2017-12-17 DIAGNOSIS — I10 Essential (primary) hypertension: Secondary | ICD-10-CM | POA: Diagnosis not present

## 2017-12-17 DIAGNOSIS — E118 Type 2 diabetes mellitus with unspecified complications: Secondary | ICD-10-CM | POA: Diagnosis not present

## 2017-12-28 ENCOUNTER — Ambulatory Visit (INDEPENDENT_AMBULATORY_CARE_PROVIDER_SITE_OTHER): Payer: PPO | Admitting: Orthopedic Surgery

## 2018-01-04 DIAGNOSIS — Z01818 Encounter for other preprocedural examination: Secondary | ICD-10-CM | POA: Diagnosis not present

## 2018-01-04 DIAGNOSIS — E119 Type 2 diabetes mellitus without complications: Secondary | ICD-10-CM | POA: Diagnosis not present

## 2018-01-04 DIAGNOSIS — N5319 Other ejaculatory dysfunction: Secondary | ICD-10-CM | POA: Diagnosis not present

## 2018-01-12 ENCOUNTER — Encounter (HOSPITAL_COMMUNITY): Payer: Self-pay | Admitting: Emergency Medicine

## 2018-01-12 ENCOUNTER — Other Ambulatory Visit: Payer: Self-pay

## 2018-01-12 ENCOUNTER — Emergency Department (HOSPITAL_COMMUNITY)
Admission: EM | Admit: 2018-01-12 | Discharge: 2018-01-12 | Disposition: A | Discharge status: Home / Self Care | Payer: PPO | Attending: Emergency Medicine | Admitting: Emergency Medicine

## 2018-01-12 DIAGNOSIS — R1110 Vomiting, unspecified: Secondary | ICD-10-CM | POA: Diagnosis not present

## 2018-01-12 DIAGNOSIS — R109 Unspecified abdominal pain: Secondary | ICD-10-CM | POA: Diagnosis not present

## 2018-01-12 DIAGNOSIS — Z5321 Procedure and treatment not carried out due to patient leaving prior to being seen by health care provider: Secondary | ICD-10-CM | POA: Diagnosis not present

## 2018-01-12 LAB — COMPREHENSIVE METABOLIC PANEL
ALBUMIN: 4.1 g/dL (ref 3.5–5.0)
ALT: 17 U/L (ref 0–44)
ANION GAP: 12 (ref 5–15)
AST: 20 U/L (ref 15–41)
Alkaline Phosphatase: 79 U/L (ref 38–126)
BUN: 11 mg/dL (ref 6–20)
CALCIUM: 9.6 mg/dL (ref 8.9–10.3)
CHLORIDE: 101 mmol/L (ref 98–111)
CO2: 26 mmol/L (ref 22–32)
Creatinine, Ser: 1.68 mg/dL — ABNORMAL HIGH (ref 0.61–1.24)
GFR calc non Af Amer: 49 mL/min — ABNORMAL LOW (ref >60)
GFR, EST AFRICAN AMERICAN: 57 mL/min — AB (ref >60)
Glucose, Bld: 196 mg/dL — ABNORMAL HIGH (ref 70–99)
Potassium: 3.6 mmol/L (ref 3.5–5.1)
Sodium: 139 mmol/L (ref 135–145)
TOTAL PROTEIN: 8.6 g/dL — AB (ref 6.5–8.1)
Total Bilirubin: 0.8 mg/dL (ref 0.3–1.2)

## 2018-01-12 LAB — CBC
HCT: 42.7 % (ref 39.0–52.0)
Hemoglobin: 13.8 g/dL (ref 13.0–17.0)
MCH: 28.6 pg (ref 26.0–34.0)
MCHC: 32.3 g/dL (ref 30.0–36.0)
MCV: 88.4 fL (ref 78.0–100.0)
PLATELETS: 255 10*3/uL (ref 150–400)
RBC: 4.83 MIL/uL (ref 4.22–5.81)
RDW: 15.1 % (ref 11.5–15.5)
WBC: 10.6 10*3/uL — AB (ref 4.0–10.5)

## 2018-01-12 LAB — LIPASE, BLOOD: LIPASE: 95 U/L — AB (ref 11–51)

## 2018-01-12 MED ORDER — ONDANSETRON 4 MG PO TBDP
4.0000 mg | ORAL_TABLET | Freq: Once | ORAL | Status: AC
  Administered 2018-01-12: 4 mg via ORAL
  Filled 2018-01-12: qty 1

## 2018-01-12 NOTE — ED Triage Notes (Signed)
Pt. Stated, [name] be been vomiting and having abdominal pain for 2 days. Active vomiting at triage.

## 2018-01-12 NOTE — ED Notes (Signed)
Unable to locate in the Lobby.

## 2018-01-12 NOTE — ED Notes (Signed)
Used over head speaker to call patient in lobby, unable to find patient.

## 2018-01-14 ENCOUNTER — Emergency Department (HOSPITAL_COMMUNITY): Payer: PPO

## 2018-01-14 ENCOUNTER — Encounter (HOSPITAL_COMMUNITY): Payer: Self-pay | Admitting: *Deleted

## 2018-01-14 ENCOUNTER — Emergency Department (HOSPITAL_COMMUNITY)
Admission: EM | Admit: 2018-01-14 | Discharge: 2018-01-14 | Disposition: A | Discharge status: Home / Self Care | Payer: PPO | Attending: Emergency Medicine | Admitting: Emergency Medicine

## 2018-01-14 DIAGNOSIS — Z89511 Acquired absence of right leg below knee: Secondary | ICD-10-CM | POA: Diagnosis not present

## 2018-01-14 DIAGNOSIS — R1111 Vomiting without nausea: Secondary | ICD-10-CM | POA: Diagnosis not present

## 2018-01-14 DIAGNOSIS — E1122 Type 2 diabetes mellitus with diabetic chronic kidney disease: Secondary | ICD-10-CM | POA: Diagnosis not present

## 2018-01-14 DIAGNOSIS — Z79899 Other long term (current) drug therapy: Secondary | ICD-10-CM | POA: Diagnosis not present

## 2018-01-14 DIAGNOSIS — E1151 Type 2 diabetes mellitus with diabetic peripheral angiopathy without gangrene: Secondary | ICD-10-CM | POA: Insufficient documentation

## 2018-01-14 DIAGNOSIS — Z794 Long term (current) use of insulin: Secondary | ICD-10-CM | POA: Diagnosis not present

## 2018-01-14 DIAGNOSIS — I129 Hypertensive chronic kidney disease with stage 1 through stage 4 chronic kidney disease, or unspecified chronic kidney disease: Secondary | ICD-10-CM | POA: Diagnosis not present

## 2018-01-14 DIAGNOSIS — N183 Chronic kidney disease, stage 3 (moderate): Secondary | ICD-10-CM | POA: Diagnosis not present

## 2018-01-14 DIAGNOSIS — Z89422 Acquired absence of other left toe(s): Secondary | ICD-10-CM | POA: Diagnosis not present

## 2018-01-14 DIAGNOSIS — R1084 Generalized abdominal pain: Secondary | ICD-10-CM | POA: Diagnosis not present

## 2018-01-14 DIAGNOSIS — R112 Nausea with vomiting, unspecified: Secondary | ICD-10-CM | POA: Diagnosis not present

## 2018-01-14 LAB — CBC
HCT: 41.8 % (ref 39.0–52.0)
HEMOGLOBIN: 13.7 g/dL (ref 13.0–17.0)
MCH: 29.5 pg (ref 26.0–34.0)
MCHC: 32.8 g/dL (ref 30.0–36.0)
MCV: 89.9 fL (ref 78.0–100.0)
Platelets: 238 10*3/uL (ref 150–400)
RBC: 4.65 MIL/uL (ref 4.22–5.81)
RDW: 15 % (ref 11.5–15.5)
WBC: 9.5 10*3/uL (ref 4.0–10.5)

## 2018-01-14 LAB — URINALYSIS, ROUTINE W REFLEX MICROSCOPIC
BACTERIA UA: NONE SEEN
BILIRUBIN URINE: NEGATIVE
Glucose, UA: NEGATIVE mg/dL
Hgb urine dipstick: NEGATIVE
Ketones, ur: NEGATIVE mg/dL
LEUKOCYTES UA: NEGATIVE
NITRITE: NEGATIVE
PROTEIN: 100 mg/dL — AB
Specific Gravity, Urine: 1.013 (ref 1.005–1.030)
pH: 7 (ref 5.0–8.0)

## 2018-01-14 LAB — COMPREHENSIVE METABOLIC PANEL
ALBUMIN: 3.9 g/dL (ref 3.5–5.0)
ALT: 15 U/L (ref 0–44)
ANION GAP: 11 (ref 5–15)
AST: 20 U/L (ref 15–41)
Alkaline Phosphatase: 76 U/L (ref 38–126)
BUN: 15 mg/dL (ref 6–20)
CO2: 28 mmol/L (ref 22–32)
Calcium: 9.3 mg/dL (ref 8.9–10.3)
Chloride: 100 mmol/L (ref 98–111)
Creatinine, Ser: 1.79 mg/dL — ABNORMAL HIGH (ref 0.61–1.24)
GFR calc Af Amer: 53 mL/min — ABNORMAL LOW (ref >60)
GFR, EST NON AFRICAN AMERICAN: 45 mL/min — AB (ref >60)
GLUCOSE: 59 mg/dL — AB (ref 70–99)
POTASSIUM: 3.5 mmol/L (ref 3.5–5.1)
Sodium: 139 mmol/L (ref 135–145)
TOTAL PROTEIN: 7.7 g/dL (ref 6.5–8.1)
Total Bilirubin: 0.6 mg/dL (ref 0.3–1.2)

## 2018-01-14 LAB — CBG MONITORING, ED
GLUCOSE-CAPILLARY: 59 mg/dL — AB (ref 70–99)
Glucose-Capillary: 92 mg/dL (ref 70–99)

## 2018-01-14 LAB — I-STAT TROPONIN, ED: Troponin i, poc: 0 ng/mL (ref 0.00–0.08)

## 2018-01-14 LAB — LIPASE, BLOOD: Lipase: 50 U/L (ref 11–51)

## 2018-01-14 MED ORDER — IOPAMIDOL (ISOVUE-300) INJECTION 61%
INTRAVENOUS | Status: AC
  Filled 2018-01-14: qty 30

## 2018-01-14 MED ORDER — FAMOTIDINE 20 MG PO TABS: 20.0000 mg | ORAL_TABLET | Freq: Two times a day (BID) | ORAL | 0 refills | Status: DC

## 2018-01-14 MED ORDER — MORPHINE SULFATE (PF) 4 MG/ML IV SOLN
4.0000 mg | Freq: Once | INTRAVENOUS | Status: AC
  Administered 2018-01-14: 4 mg via INTRAVENOUS
  Filled 2018-01-14: qty 1

## 2018-01-14 MED ORDER — LABETALOL HCL 5 MG/ML IV SOLN
10.0000 mg | Freq: Once | INTRAVENOUS | Status: AC
  Administered 2018-01-14: 10 mg via INTRAVENOUS
  Filled 2018-01-14: qty 4

## 2018-01-14 MED ORDER — ONDANSETRON HCL 4 MG/2ML IJ SOLN
4.0000 mg | Freq: Once | INTRAMUSCULAR | Status: AC
  Administered 2018-01-14: 4 mg via INTRAVENOUS
  Filled 2018-01-14: qty 2

## 2018-01-14 MED ORDER — SODIUM CHLORIDE 0.9 % IV BOLUS
1000.0000 mL | Freq: Once | INTRAVENOUS | Status: AC
  Administered 2018-01-14: 1000 mL via INTRAVENOUS

## 2018-01-14 MED ORDER — ONDANSETRON 4 MG PO TBDP: 4.0000 mg | ORAL_TABLET | Freq: Three times a day (TID) | ORAL | 0 refills | Status: DC | PRN

## 2018-01-14 MED ORDER — SUCRALFATE 1 G PO TABS: 1.0000 g | ORAL_TABLET | Freq: Three times a day (TID) | ORAL | 0 refills | Status: DC

## 2018-01-14 MED ORDER — DEXTROSE 50 % IV SOLN
25.0000 mL | Freq: Once | INTRAVENOUS | Status: AC
  Administered 2018-01-14: 25 mL via INTRAVENOUS
  Filled 2018-01-14: qty 50

## 2018-01-14 MED ORDER — FAMOTIDINE IN NACL 20-0.9 MG/50ML-% IV SOLN
20.0000 mg | Freq: Once | INTRAVENOUS | Status: AC
  Administered 2018-01-14: 20 mg via INTRAVENOUS
  Filled 2018-01-14: qty 50

## 2018-01-14 NOTE — ED Notes (Signed)
Back from CT

## 2018-01-14 NOTE — ED Triage Notes (Addendum)
Pt in c/o abdominal pain with n/v, came in recently for the same thing but did not stay and get evaluated, no distress noted, pt reports CBG at home was 500

## 2018-01-14 NOTE — ED Notes (Signed)
Pt reports he took his insulin this morning and he did eat, but vomited after, CBG 65 in triage, given juice

## 2018-01-14 NOTE — ED Notes (Signed)
Checked patient blood sugar it was 65 notified RN of blood sugar

## 2018-01-14 NOTE — ED Provider Notes (Signed)
[facility] EMERGENCY DEPARTMENT Provider Note   CSN: [id number] Arrival date & time: 01/14/18  8280     History   Chief Complaint Chief Complaint  Patient presents with  . Abdominal Pain    HPI [name] is a 42 y.o. male.  The history is provided by the patient. No language interpreter was used.  Abdominal Pain     [name] is a 42 y.o. male who presents to the Emergency Department complaining of abdominal pain. Since the emergency department complaining of abdominal pain and vomiting. Three days ago he developed generalized abdominal pain that is constant nature. He also reports numerous episodes of emesis with greater than 20 episodes daily. He states that it looks red but he is been drinking red Gatorade. He has subjective fevers but not checked his temperature at home. No diarrhea, constipation, dysuria. No known sick contacts or bad food exposures. Symptoms are moderate, constant and worsening. Past Medical History:  Diagnosis Date  . Acquired contracture of Achilles tendon, right   . Acute osteomyelitis, ankle and foot 07/30/2010   Qualifier: Diagnosis of  By: Tommy Medal MD, Roderic Scarce    . Anemia   . Chronic kidney disease (CKD), stage III (moderate) (HCC)   . Chronic osteomyelitis of right foot ([place])   . Collagen vascular disease ([place])   . DDD (degenerative disc disease), lumbar   . Dehiscence of amputation stump (HCC)     dehiscence right transmetetarsal amputation achilles contracture  . Diabetic foot ulcer ([place]) 05/14/2017  . Diabetic foot ulcer with osteomyelitis ([place]) 05/12/2013  . Gastroparesis   . GERD (gastroesophageal reflux disease)   . Headache   . Hiatal hernia   . Hyperlipidemia   . Hypertension   . Pancreatitis   . Peripheral vascular disease ([place])   . Polysubstance abuse ([place]) 03/08/2016  . Renal insufficiency   . Status post transmetatarsal amputation of foot, right ([place]) 07/10/2016  . Type II diabetes mellitus ([place]) dx'd ~  1996  . Vascular disease    poor circulation to left foot    Patient Active Problem List   Diagnosis Date Noted  . Chronic anemia   . Diabetes mellitus type 2 with complications, uncontrolled ([place])   . Diabetic peripheral neuropathy ([place])   . PVD (peripheral vascular disease) ([place])   . Hypokalemia   . Acute blood loss anemia   . Post-operative pain   . Unilateral complete BKA, right, subsequent encounter ([place])   . Diabetic polyneuropathy associated with type 2 diabetes mellitus ([place]) 07/10/2016  . Polysubstance abuse ([place]) 03/08/2016  . Tobacco abuse 02/27/2016  . Gastroparesis 05/28/2015  . GERD (gastroesophageal reflux disease) 10/01/2014  . Esophageal reflux   . Fever 09/27/2014  . Abdominal pain, lower 09/27/2014  . Abnormal ECG 05/30/2014  . Chest pain 05/30/2014  . Ankle pain 05/30/2014  . Pain in the chest   . Nausea 08/30/2013  . Epigastric abdominal pain 08/30/2013  . Low grade fever 08/30/2013  . Diabetic osteomyelitis b/l toes 03/23/2013  . DM (diabetes mellitus) type II uncontrolled, periph vascular disorder ([place]) 03/23/2013  . CKD (chronic kidney disease), stage III ([place]) 03/23/2013  . Anemia 03/23/2013  . Benign essential HTN 03/23/2013  . AKI (acute kidney injury) ([place]) 03/22/2013  . Viral gastroenteritis 09/17/2012  . Nausea & vomiting 09/16/2012  . Acute pancreatitis 09/16/2012  . Diabetes mellitus ([place]) 09/20/2010  . Onychomycosis 09/20/2010  . METHICILLIN SUSCEPTIBLE STAPH AUREUS SEPTICEMIA 07/30/2010    Past Surgical History:  Procedure Laterality Date  . AMPUTATION  04/25/2011   Procedure: AMPUTATION DIGIT;  Surgeon: Newt Minion, MD;  Location: [facility];  Service: Orthopedics;  Laterality: Left;  Left foot 3rd toe amputation MTP joint, Gastroc Recession  Achilles Lengthening   . AMPUTATION Bilateral 03/25/2013   Procedure: AMPUTATION RAY;  Surgeon: Newt Minion, MD;  Location: [facility];  Service: Orthopedics;  Laterality: Bilateral;  Left Great Toe  Amputation at  MTP Joint, Right 1st and 2nd Ray Amputation   . AMPUTATION Right 10/03/2014   Procedure: AMPUTATION MIDFOOT;  Surgeon: Newt Minion, MD;  Location: [facility];  Service: Orthopedics;  Laterality: Right;  . AMPUTATION Right 07/14/2017   Procedure: RIGHT BELOW KNEE AMPUTATION;  Surgeon: Newt Minion, MD;  Location: [facility];  Service: Orthopedics;  Laterality: Right;  . LAPAROSCOPIC CHOLECYSTECTOMY    . STUMP REVISION Right 05/23/2016   Procedure: Revision Right Transmetatarsal Amputation, Right Gastrocnemius Recession;  Surgeon: Newt Minion, MD;  Location: [facility];  Service: Orthopedics;  Laterality: Right;  . STUMP REVISION Right 05/15/2017   Procedure: REVISION RIGHT TRANSMETATARSAL AMPUTATION;  Surgeon: Newt Minion, MD;  Location: [facility];  Service: Orthopedics;  Laterality: Right;  . TOE AMPUTATION  2012   left foot; great toe and second toe        Home Medications    Prior to Admission medications   Medication Sig Start Date End Date Taking? Authorizing Provider  amLODipine (NORVASC) 5 MG tablet Take 1 tablet (5 mg total) by mouth daily. Patient taking differently: Take 10 mg by mouth daily.  07/17/17  Yes Nita Sells, MD  atorvastatin (LIPITOR) 10 MG tablet Take 10 mg by mouth daily. 03/29/17  Yes [provider]  insulin aspart (NOVOLOG) 100 UNIT/ML injection Inject 8 Units into the skin 3 (three) times daily with meals. Patient taking differently: Inject 3 Units into the skin 3 (three) times daily with meals.  07/17/17  Yes Nita Sells, MD  insulin glargine (LANTUS) 100 UNIT/ML injection Inject 0.05 mLs (5 Units total) into the skin daily. Patient taking differently: Inject 8 Units into the skin daily.  07/17/17  Yes Nita Sells, MD  blood glucose meter kit and supplies KIT Dispense based on patient and insurance preference. Use up to four times daily as directed. (FOR ICD-9 250.00, 250.01). 05/16/17   Rai, Vernelle Emerald, MD  famotidine (PEPCID) 20  MG tablet Take 1 tablet (20 mg total) by mouth 2 (two) times daily. 01/14/18   Quintella Reichert, MD  Insulin Syringes, Disposable, U-100 0.5 ML MISC Use with lantus and novolog vials. 05/16/17   Rai, Vernelle Emerald, MD  metoCLOPramide (REGLAN) 10 MG tablet Take 1 tablet (10 mg total) by mouth every 6 (six) hours. Patient not taking: Reported on 01/14/2018 08/15/17   McDonald, Mia A, PA-C  ondansetron (ZOFRAN ODT) 4 MG disintegrating tablet Take 1 tablet (4 mg total) by mouth every 8 (eight) hours as needed for nausea or vomiting. 01/14/18   Quintella Reichert, MD  oxyCODONE-acetaminophen (PERCOCET/ROXICET) 5-325 MG tablet Take 1 tablet by mouth every 8 (eight) hours as needed for severe pain. Patient not taking: Reported on 01/14/2018 08/18/17   Newt Minion, MD  sucralfate (CARAFATE) 1 g tablet Take 1 tablet (1 g total) by mouth 4 (four) times daily -  with meals and at bedtime. 01/14/18   Quintella Reichert, MD  pantoprazole (PROTONIX) 40 MG tablet Take 1 tablet (40 mg total) by mouth 2 (two) times daily. Patient not  taking: Reported on 10/01/2014 09/30/14 05/26/15  Barton Dubois, MD    Family History Family History  Problem Relation Age of Onset  . Heart attack Father 52  . Hypertension Sister     Social History Social History   Tobacco Use  . Smoking status: Former Smoker    Packs/day: 0.10    Years: 4.00    Pack years: 0.40    Types: Cigarettes    Last attempt to quit: 06/10/2015    Years since quitting: 2.6  . Smokeless tobacco: Never Used  Substance Use Topics  . Alcohol use: No  . Drug use: Yes    Frequency: 10.0 times per week    Types: Marijuana     Allergies   No known allergies   Review of Systems Review of Systems  Gastrointestinal: Positive for abdominal pain.  All other systems reviewed and are negative.    Physical Exam Updated Vital Signs BP 126/84   Pulse 80   Temp 99 F (37.2 C) (Oral)   Resp 16   SpO2 100%   Physical Exam  Constitutional: He is oriented to  person, place, and time. He appears well-developed and well-nourished.  HENT:  Head: Normocephalic and atraumatic.  Cardiovascular: Normal rate and regular rhythm.  No murmur heard. Pulmonary/Chest: Effort normal and breath sounds normal. No respiratory distress.  Abdominal: Soft. There is no rebound and no guarding.  Mild generalized abdominal tenderness without guarding or rebound.   Musculoskeletal: He exhibits no edema or tenderness.  Right BKA  Neurological: He is alert and oriented to person, place, and time.  Skin: Skin is warm and dry.  Psychiatric: He has a normal mood and affect. His behavior is normal.  Nursing note and vitals reviewed.    ED Treatments / Results  Labs (all labs ordered are listed, but only abnormal results are displayed) Labs Reviewed  COMPREHENSIVE METABOLIC PANEL - Abnormal; Notable for the following components:      Result Value   Glucose, Bld 59 (*)    Creatinine, Ser 1.79 (*)    GFR calc non Af Amer 45 (*)    GFR calc Af Amer 53 (*)    All other components within normal limits  URINALYSIS, ROUTINE W REFLEX MICROSCOPIC - Abnormal; Notable for the following components:   Protein, ur 100 (*)    All other components within normal limits  CBG MONITORING, ED - Abnormal; Notable for the following components:   Glucose-Capillary 59 (*)    All other components within normal limits  LIPASE, BLOOD  CBC  CBG MONITORING, ED  I-STAT TROPONIN, ED  CBG MONITORING, ED    EKG EKG Interpretation  Date/Time:  Thursday January 14 2018 10:40:13 EDT Ventricular Rate:  81 PR Interval:    QRS Duration: 93 QT Interval:  354 QTC Calculation: 411 R Axis:   18 Text Interpretation:  Sinus rhythm Ventricular premature complex Borderline short PR interval Borderline T abnormalities, diffuse leads Baseline wander in lead(s) V2 Confirmed by Quintella Reichert [phone]) on 01/14/2018 11:13:47 AM   Radiology Ct Abdomen Pelvis Wo Contrast  Result Date: 01/14/2018 CLINICAL  DATA:  Abdominal pain with nausea and vomiting EXAM: CT ABDOMEN AND PELVIS WITHOUT CONTRAST TECHNIQUE: Multidetector CT imaging of the abdomen and pelvis was performed following the standard protocol without IV contrast. Oral contrast was administered. 6 COMPARISON:  CT abdomen and pelvis August 15, 2017 FINDINGS: Lower chest: Lung bases are clear. Hepatobiliary: No focal liver lesions are apparent. Gallbladder is absent. There is  no biliary duct dilatation. Pancreas: There is no pancreatic mass or inflammatory focus. Spleen: No splenic lesions are evident. Adrenals/Urinary Tract: Adrenals bilaterally appear normal. Right kidney is inferiorly positioned and malrotated, a stable finding. There is no evident renal mass or hydronephrosis on either side. There is no appreciable renal or ureteral calculus on either side. Urinary bladder is midline with urinary bladder wall somewhat thickened. Stomach/Bowel: There is no appreciable bowel wall or mesenteric thickening. No evident bowel obstruction. No free air or portal venous air. Vascular/Lymphatic: There is no abdominal aortic aneurysm. No vascular lesions are appreciable on this noncontrast enhanced study. There is no evident adenopathy in the abdomen or pelvis. Reproductive: Prostate and seminal vesicles are normal in size and contour. No pelvic masses evident. Other: The appendix appears normal. There is no abscess or ascites in the abdomen or pelvis. There is a small ventral hernia containing only fat. Musculoskeletal: There are foci of air in the subcutaneous and fatty tissues of the lower left abdominal wall anterolaterally. There is degenerative change in the lower lumbar spine. There are no blastic or lytic bone lesions. No intramuscular lesions are evident. IMPRESSION: 1. Thickening of the urinary bladder wall. Suspect a degree of cystitis. 2. Right kidney is inferiorly positioned and malrotated, a stable finding. No renal or ureteral calculus on either side. No  hydronephrosis on either side. 3. No evident bowel obstruction. No free air. No abscess evident in the abdomen pelvis. Appendix appears normal. 4.  Gallbladder absent. 5. Air in the anterolateral lower left abdominal wall region. Question injections in this area. Electronically Signed   By: Lowella Grip III [name]   On: 01/14/2018 13:54    Procedures Procedures (including critical care time)  Medications Ordered in ED Medications  iopamidol (ISOVUE-300) 61 % injection (has no administration in time range)  morphine 4 MG/ML injection 4 mg (4 mg Intravenous Given 01/14/18 0942)  ondansetron (ZOFRAN) injection 4 mg (4 mg Intravenous Given 01/14/18 0940)  sodium chloride 0.9 % bolus 1,000 mL (0 mLs Intravenous Stopped 01/14/18 1038)  ondansetron (ZOFRAN) injection 4 mg (4 mg Intravenous Given 01/14/18 1038)  famotidine (PEPCID) IVPB 20 mg premix (0 mg Intravenous Stopped 01/14/18 1103)  dextrose 50 % solution 25 mL (25 mLs Intravenous Given 01/14/18 1104)  labetalol (NORMODYNE,TRANDATE) injection 10 mg (10 mg Intravenous Given 01/14/18 1106)  morphine 4 MG/ML injection 4 mg (4 mg Intravenous Given 01/14/18 1122)     Initial Impression / Assessment and Plan / ED Course  I have reviewed the triage vital signs and the nursing notes.  Pertinent labs & imaging results that were available during my care of the patient were reviewed by me and considered in my medical decision making (see chart for details).     Patient with history of diabetes, hypertension here for evaluation of three days of abdominal pain nausea and vomiting. He was hypotensive on ED arrival with abdominal tenderness, nausea and vomiting. He was treated with antiemetics, pain medications and IV fluids. He had been vomiting his blood pressure medications at home and had been without them for two days. After treatment in the emergency department his pain is resolved and he is tolerating oral's without difficulty. He did have transient  hypoglycemia but he took his insulin this morning and did not have anything to eat or drink. No recurrent hypoglycemia on recheck. Imaging is negative for acute bowel obstruction, appendicitis, diverticulitis, hypertensive urgency. Discussed with patient home care for nausea, vomiting, possible gastritis. Discussed outpatient follow-up  as well as return precautions.  Final Clinical Impressions(s) / ED Diagnoses   Final diagnoses:  Non-intractable vomiting with nausea, unspecified vomiting type  Generalized abdominal pain    ED Discharge Orders         Ordered    ondansetron (ZOFRAN ODT) 4 MG disintegrating tablet  Every 8 hours PRN     01/14/18 1442    sucralfate (CARAFATE) 1 g tablet  3 times daily with meals & bedtime     01/14/18 1442    famotidine (PEPCID) 20 MG tablet  2 times daily     01/14/18 1442           Quintella Reichert, MD 01/14/18 1444

## 2018-01-17 LAB — CBG MONITORING, ED
Glucose-Capillary: 49 mg/dL — ABNORMAL LOW (ref 70–99)
Glucose-Capillary: 65 mg/dL — ABNORMAL LOW (ref 70–99)

## 2018-01-25 DIAGNOSIS — Z89511 Acquired absence of right leg below knee: Secondary | ICD-10-CM | POA: Diagnosis not present

## 2018-01-27 ENCOUNTER — Other Ambulatory Visit (INDEPENDENT_AMBULATORY_CARE_PROVIDER_SITE_OTHER): Payer: Self-pay

## 2018-01-27 ENCOUNTER — Telehealth (INDEPENDENT_AMBULATORY_CARE_PROVIDER_SITE_OTHER): Payer: Self-pay | Admitting: Orthopedic Surgery

## 2018-01-27 DIAGNOSIS — S88111D Complete traumatic amputation at level between knee and ankle, right lower leg, subsequent encounter: Secondary | ICD-10-CM

## 2018-01-27 NOTE — Telephone Encounter (Signed)
Order in epic and [facility]ed to [facility] for right BKA prosthetic gait training.

## 2018-01-27 NOTE — Telephone Encounter (Signed)
Olegario [facility]Kathy at [facility] needs a prescription for physical therapy for gait training be faxed to them fax # [phone].   # for Olegario MessierKathy if needed [phone]

## 2018-01-29 IMAGING — DX DG FOOT COMPLETE 3+V*R*
3 series · 3 of 3 positions shown · non-contrast
Comparison: 10/27/2016

CLINICAL DATA: Foul-smelling discharge from top of foot for 1 week,
history diabetes mellitus

EXAM:
RIGHT FOOT COMPLETE - 3+ VIEW

[x foot ap right]
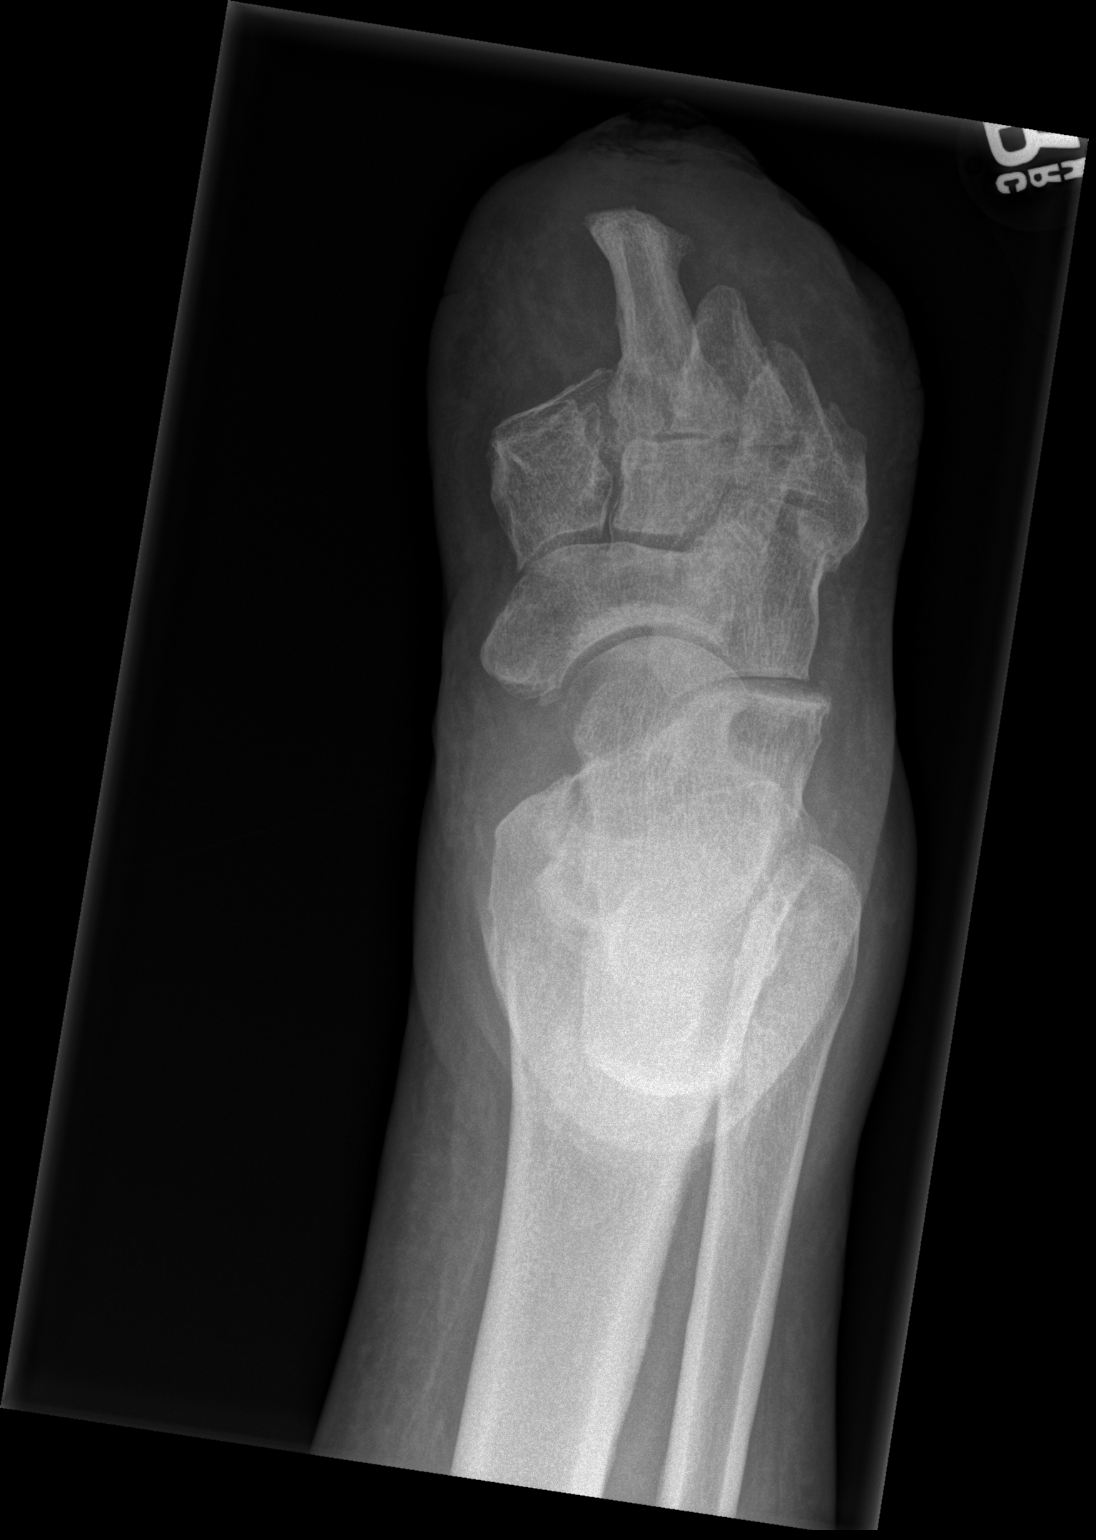

[x foot obl right]
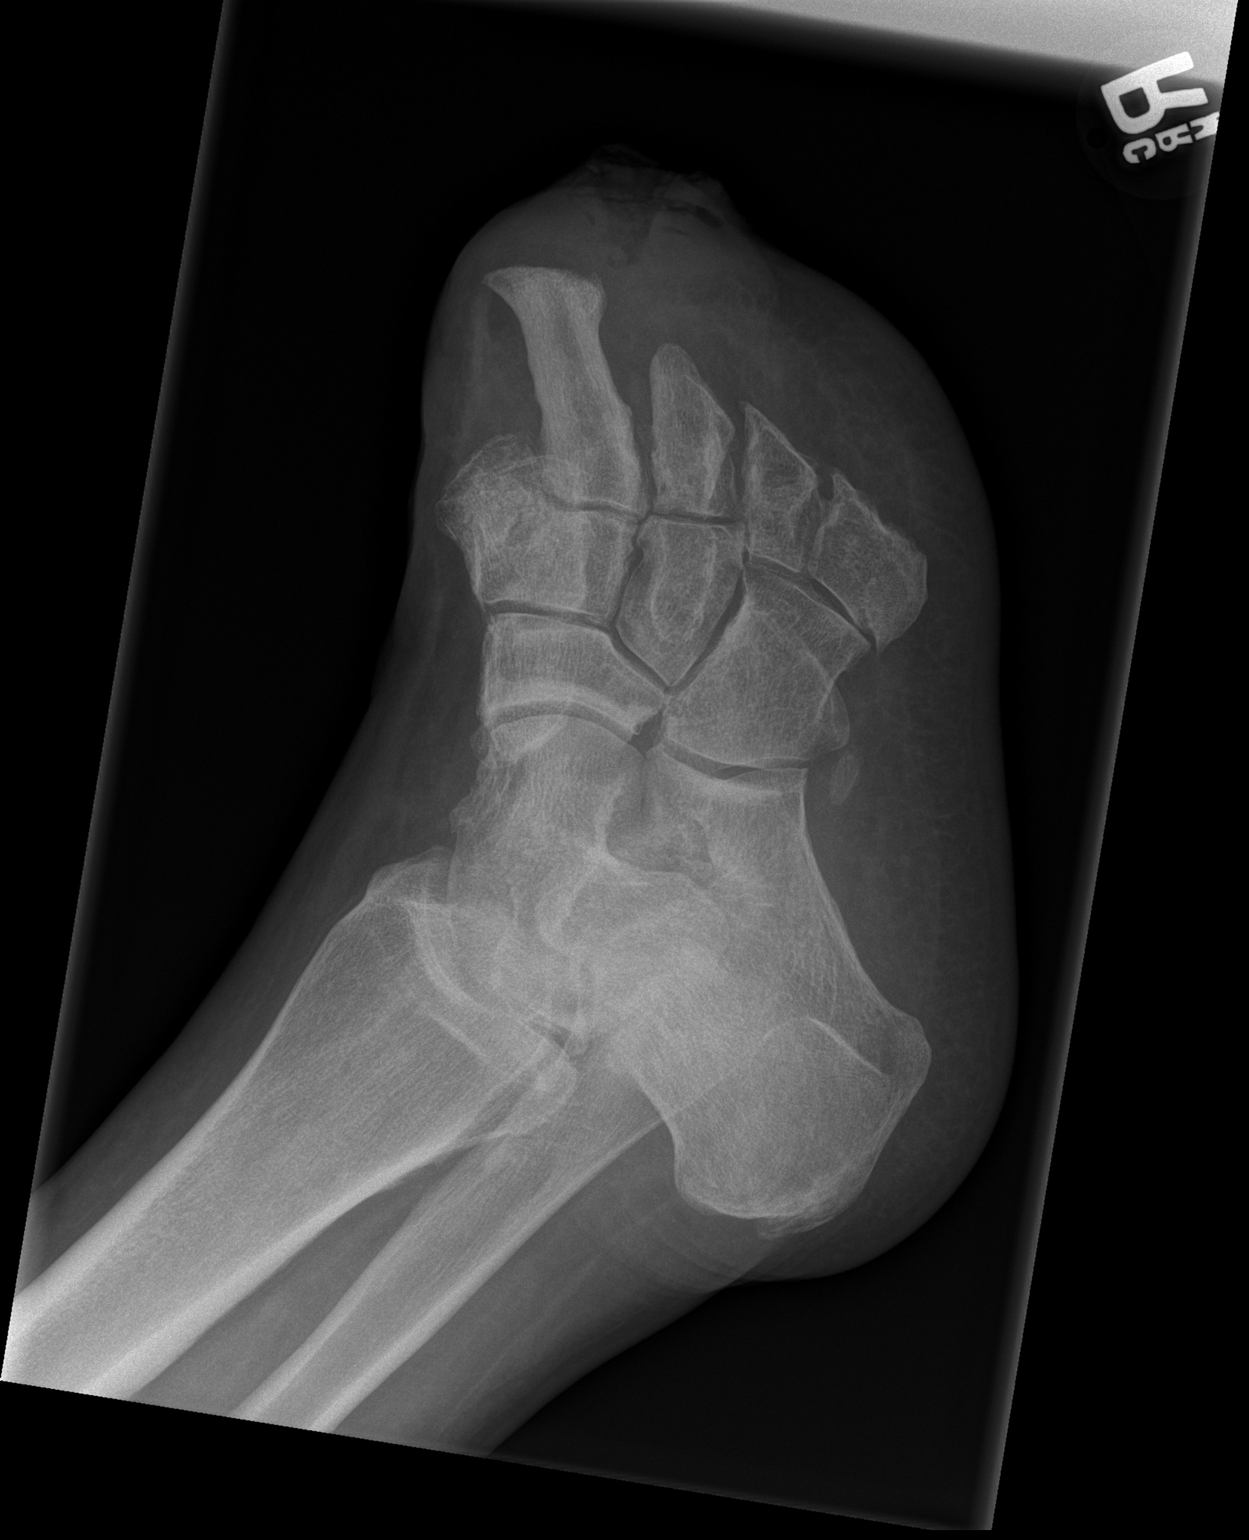

[x foot lat right]
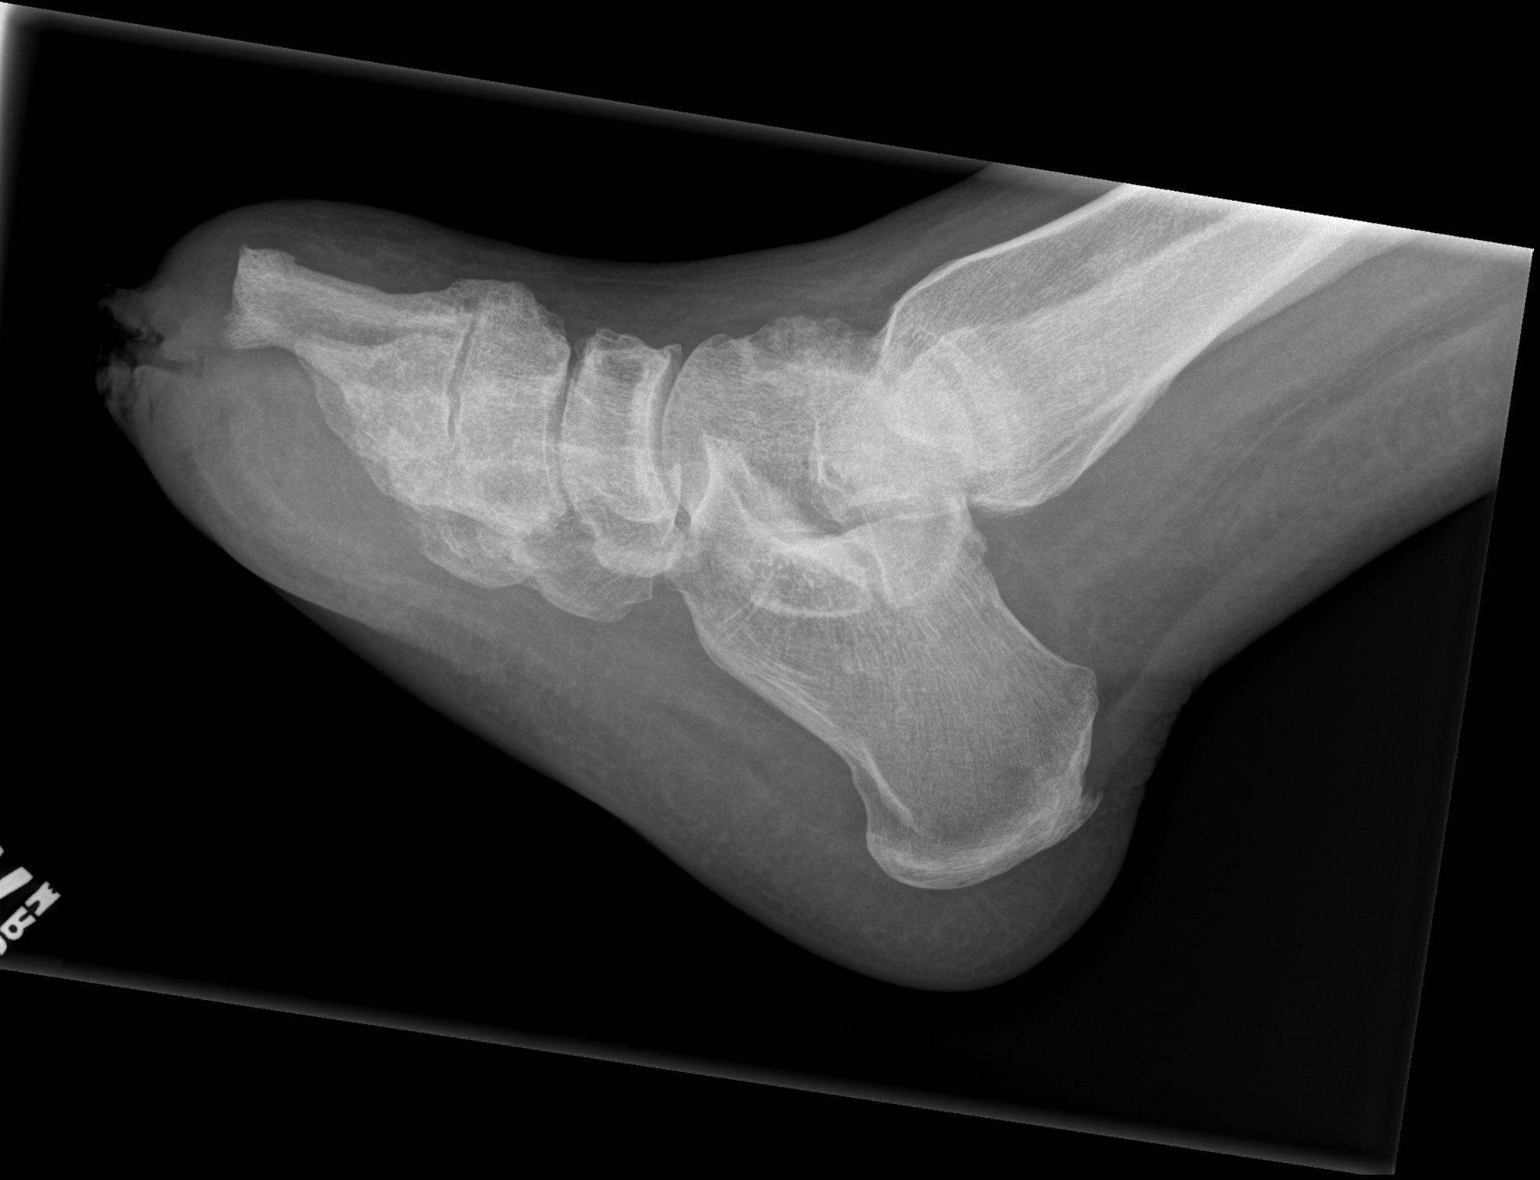

[3 of 3 positions shown; findings below may reference images not displayed]

FINDINGS: Prior transmetatarsal amputation of second through fifth rays with
amputation of the entirety of the first metatarsal.

Distal soft tissue swelling with soft tissue irregularity and gas at
the distal stump.

Bones demineralized.

No acute fracture or dislocation.

Question new cortical loss at the volar aspect of the stump of the
second metatarsal concerning for acute osteomyelitis, new since
prior exam.
IMPRESSION: Distal soft tissue swelling and gas with suspected new bone
destruction at the distal aspect of the second metatarsal stump,
concerning for acute osteomyelitis.

## 2018-02-03 ENCOUNTER — Ambulatory Visit: Payer: PPO | Admitting: Physical Therapy

## 2018-02-10 ENCOUNTER — Ambulatory Visit: Payer: PPO | Attending: Orthopedic Surgery | Admitting: Physical Therapy

## 2018-02-10 ENCOUNTER — Encounter: Payer: Self-pay | Admitting: Physical Therapy

## 2018-02-10 ENCOUNTER — Other Ambulatory Visit: Payer: Self-pay

## 2018-02-10 DIAGNOSIS — Z9181 History of falling: Secondary | ICD-10-CM | POA: Diagnosis not present

## 2018-02-10 DIAGNOSIS — R293 Abnormal posture: Secondary | ICD-10-CM | POA: Insufficient documentation

## 2018-02-10 DIAGNOSIS — R2681 Unsteadiness on feet: Secondary | ICD-10-CM | POA: Diagnosis not present

## 2018-02-10 DIAGNOSIS — M6281 Muscle weakness (generalized): Secondary | ICD-10-CM | POA: Insufficient documentation

## 2018-02-10 DIAGNOSIS — R2689 Other abnormalities of gait and mobility: Secondary | ICD-10-CM | POA: Diagnosis not present

## 2018-02-10 NOTE — Therapy (Signed)
[facility] [address] [place], [place], [address] Phone: [phone]   Fax:  [phone]  Physical Therapy Evaluation  Patient Details  Name: [name] MRN: [id number] Date of Birth: [birth year] Referring Provider: Aldean Baker, MD   Encounter Date: 02/10/2018  PT End of Session - 02/10/18 1953    Visit Number  1    Number of Visits  26    Date for PT Re-Evaluation  05/11/18    Authorization Type  [employer]    Authorization Time Period  $15 co-pay, $3400oop $1702. ,  VL: follow Medicare guidelines    PT Start Time  1100    PT Stop Time  1152    PT Time Calculation (min)  52 min    Equipment Utilized During Treatment  Gait belt    Activity Tolerance  Patient tolerated treatment well    Behavior During Therapy  WFL for tasks assessed/performed       Past Medical History:  Diagnosis Date  . Acquired contracture of Achilles tendon, right   . Acute osteomyelitis, ankle and foot 07/30/2010   Qualifier: Diagnosis of  By: Daiva Eves MD, Remi Haggard    . Anemia   . Chronic kidney disease (CKD), stage III (moderate) (HCC)   . Chronic osteomyelitis of right foot (HCC)   . Collagen vascular disease (HCC)   . DDD (degenerative disc disease), lumbar   . Dehiscence of amputation stump (HCC)     dehiscence right transmetetarsal amputation achilles contracture  . Diabetic foot ulcer (HCC) 05/14/2017  . Diabetic foot ulcer with osteomyelitis (HCC) 05/12/2013  . Gastroparesis   . GERD (gastroesophageal reflux disease)   . Headache   . Hiatal hernia   . Hyperlipidemia   . Hypertension   . Pancreatitis   . Peripheral vascular disease (HCC)   . Polysubstance abuse (HCC) 03/08/2016  . Renal insufficiency   . Status post transmetatarsal amputation of foot, right (HCC) 07/10/2016  . Type II diabetes mellitus (HCC) dx'd ~ 1996  . Vascular disease    poor circulation to left foot    Past Surgical History:   Procedure Laterality Date  . AMPUTATION  04/25/2011   Procedure: AMPUTATION DIGIT;  Surgeon: Nadara Mustard, MD;  Location: [facility] OR;  Service: Orthopedics;  Laterality: Left;  Left foot 3rd toe amputation MTP joint, Gastroc Recession  Achilles Lengthening   . AMPUTATION Bilateral 03/25/2013   Procedure: AMPUTATION RAY;  Surgeon: Nadara Mustard, MD;  Location: [facility] OR;  Service: Orthopedics;  Laterality: Bilateral;  Left Great Toe Amputation at  MTP Joint, Right 1st and 2nd Ray Amputation   . AMPUTATION Right 10/03/2014   Procedure: AMPUTATION MIDFOOT;  Surgeon: Nadara Mustard, MD;  Location: [facility] OR;  Service: Orthopedics;  Laterality: Right;  . AMPUTATION Right 07/14/2017   Procedure: RIGHT BELOW KNEE AMPUTATION;  Surgeon: Nadara Mustard, MD;  Location: [facility] OR;  Service: Orthopedics;  Laterality: Right;  . LAPAROSCOPIC CHOLECYSTECTOMY    . STUMP REVISION Right 05/23/2016   Procedure: Revision Right Transmetatarsal Amputation, Right Gastrocnemius Recession;  Surgeon: Nadara Mustard, MD;  Location: [facility] OR;  Service: Orthopedics;  Laterality: Right;  . STUMP REVISION Right 05/15/2017   Procedure: REVISION RIGHT TRANSMETATARSAL AMPUTATION;  Surgeon: Nadara Mustard, MD;  Location: [facility] OR;  Service: Orthopedics;  Laterality: Right;  . TOE AMPUTATION  2012   left foot; great toe and second toe    There were no vitals filed for this visit.  Subjective Assessment - 02/10/18 1109    Subjective  This 42yo male was referred for PT evaluation on 01/27/2018 by Aldean Baker, MD. He underwent a right Transtibial Amputation on 07/14/2017 due to osteomyelitis(Transmetatarsal Amputation 10/03/2014, revision 05/15/2017) and left toe amputations (1-3rs toes amputated) 2012 & 2014. He received his first prosthesis 01/25/2018. He recieved prosthesis 01/25/2018    Patient is accompained by:  Family member   wife   Pertinent History  R TTA, left toe amputations, DM2, CKD3, DDD lumbar LBP, HTN, PVD, polysubstance abuse,     Limitations   Lifting;Standing;Walking;House hold activities    Patient Stated Goals  He wants to use prosthesis to walk, mowing grass, church  activities, basketball    Currently in Pain?  Yes    Pain Score  6    in last week, worst 10/10, best 2/10   Pain Location  Back    Pain Orientation  Posterior;Mid;Lower;Left    Pain Descriptors / Indicators  Aching;Spasm    Pain Type  Chronic pain    Pain Onset  More than a month ago    Pain Frequency  Constant    Aggravating Factors   laying down,     Pain Relieving Factors  rest         [facility] PT Assessment - 02/10/18 1100      Assessment   Medical Diagnosis  Right Transtibial Amputation    Referring Provider  Aldean Baker, MD    Onset Date/Surgical Date  01/26/08   prosthesis delivery   Hand Dominance  Left      Precautions   Precautions  Fall      Balance Screen   Has the patient fallen in the past 6 months  Yes    How many times?  2   horsing around, one opened amputation some   Has the patient had a decrease in activity level because of a fear of falling?   No    Is the patient reluctant to leave their home because of a fear of falling?   No      Home Environment   Living Environment  Private residence    Living Arrangements  Spouse/significant other;Parent    Type of Home  House    Home Access  Ramped entrance   front entrance 2 steps no rails   Home Layout  One level    Home Equipment  Crutches;Walker - 2 wheels;Cane - single point      Prior Function   Level of Independence  Independent;Independent with community mobility with device;Independent with household mobility without device   partial foot amputations, cane in community   Vocation  On disability    Leisure  basketball, church      Posture/Postural Control   Posture/Postural Control  Postural limitations    Postural Limitations  Rounded Shoulders;Forward head;Flexed trunk;Weight shift left      ROM / Strength   AROM / PROM / Strength  AROM;Strength      AROM    Overall AROM   Within functional limits for tasks performed      Strength   Overall Strength  Deficits;Within functional limits for tasks performed    Overall Strength Comments  Left Ankle DF 4/5      Transfers   Transfers  Sit to Stand;Stand to Sit    Sit to Stand  5: Supervision;With upper extremity assist;With armrests;From chair/3-in-1   uses back of legs against sturdy chair to stabilize   Stand to Sit  5: Supervision;With upper extremity assist;With armrests;To chair/3-in-1   uses back of legs against chair for stability     Ambulation/Gait   Ambulation/Gait  Yes    Ambulation/Gait Assistance  4: Min assist    Ambulation/Gait Assistance Details  Arrived using crutches with supervision;  Assessed with cane & no device.     Ambulation Distance (Feet)  250 Feet    Assistive device  Prosthesis;Crutches;Straight cane;None    Gait Pattern  Step-through pattern;Decreased arm swing - right;Decreased step length - left;Decreased stance time - right;Decreased stride length;Decreased hip/knee flexion - right;Decreased weight shift to right;Right hip hike;Antalgic;Trunk flexed;Abducted- right    Ambulation Surface  Indoor;Level    Gait velocity  1.80 ft/sec with cane    Stairs  Yes    Stairs Assistance  5: Supervision    Stairs Assistance Details (indicate cue type and reason)  verbal cues on sequence with TTA prosthesis    Stair Management Technique  Two rails;Step to pattern;Forwards    Number of Stairs  4      Standardized Balance Assessment   Standardized Balance Assessment  Berg Balance Test;Timed Up and Go Test;Dynamic Gait Index      Berg Balance Test   Sit to Stand  Able to stand  independently using hands    Standing Unsupported  Able to stand 2 minutes with supervision    Sitting with Back Unsupported but Feet Supported on Floor or Stool  Able to sit safely and securely 2 minutes    Stand to Sit  Uses backs of legs against chair to control descent    Transfers  Able to  transfer safely, definite need of hands    Standing Unsupported with Eyes Closed  Able to stand 10 seconds with supervision    Standing Ubsupported with Feet Together  Able to place feet together independently but unable to hold for 30 seconds    From Standing, Reach Forward with Outstretched Arm  Can reach forward >5 cm safely (2")    From Standing Position, Pick up Object from Floor  Able to pick up shoe, needs supervision    From Standing Position, Turn to Look Behind Over each Shoulder  Needs supervision when turning    Turn 360 Degrees  Needs close supervision or verbal cueing    Standing Unsupported, Alternately Place Feet on Step/Stool  Able to complete >2 steps/needs minimal assist    Standing Unsupported, One Foot in Front  Needs help to step but can hold 15 seconds    Standing on One Leg  Tries to lift leg/unable to hold 3 seconds but remains standing independently    Total Score  30      Dynamic Gait Index   Level Surface  Moderate Impairment    Change in Gait Speed  Moderate Impairment    Gait with Horizontal Head Turns  Moderate Impairment    Gait with Vertical Head Turns  Moderate Impairment    Gait and Pivot Turn  Moderate Impairment    Step Over Obstacle  Moderate Impairment    Step Around Obstacles  Moderate Impairment    Steps  Moderate Impairment    Total Score  8    DGI comment:  performed with cane & TTA prosthesis      Timed Up and Go Test   Normal TUG (seconds)  12.14   no device except prosthesis with minA     Prosthetics Assessment - 02/10/18 1100      Prosthetics   Prosthetic  Care Dependent with  Skin check;Residual limb care;Prosthetic cleaning;Care of non-amputated limb;Ply sock cleaning;Correct ply sock adjustment;Proper wear schedule/adjustment;Proper weight-bearing schedule/adjustment    Donning prosthesis   Supervision    Doffing prosthesis   Supervision    Current prosthetic wear tolerance (days/week)   reports 16 of 16 days since delivery     Current prosthetic wear tolerance (#hours/day)   30 minutes 1-2 x/day    Current prosthetic weight-bearing tolerance (hours/day)   Patient tolerated standing with partial weight on prosthesis for 8 minutes with mild residual limb pain.     Edema  pitting edema with 3 sec refill    Residual limb condition   cylinderical shape, normal hair growth, color & temperature, mild dry skin, no open areas on scar, mild adherance at distal tibia.     K code/activity level with prosthetic use   K3 full community with variable cadence               Objective measurements completed on examination: See above findings.      [facility] Adult PT Treatment/Exercise - 02/10/18 1100      Prosthetics   Prosthetic Care Comments   PT insttructed in 4 options with driving with TTA and need to practice foot work in empty large parking lot. PT instructed to wear 2 hrs 2x/day & increase q5 days if no issues with skin or limb pain.     Education Provided  Skin check;Residual limb care;Prosthetic cleaning;Correct ply sock adjustment;Proper Donning;Proper wear schedule/adjustment;Other (comment)   see prosthetic care comments   Person(s) Educated  Patient;Spouse    Education Method  Explanation;Demonstration;Tactile cues;Verbal cues    Education Method  Verbalized understanding;Returned demonstration;Tactile cues required;Verbal cues required;Needs further instruction               PT Short Term Goals - 02/10/18 2024      PT SHORT TERM GOAL #1   Title  Patient demonstrates proper donning of prosthesis and verbalizes proper cleaning. (All STGs Target Date: 03/11/2018)    Time  1    Period  Months    Status  New    Target Date  03/11/18      PT SHORT TERM GOAL #2   Title  Patient tolerates prosthesis wear >10 hrs total per day without skin issues.     Time  1    Period  Months    Status  New    Target Date  03/11/18      PT SHORT TERM GOAL #3   Title  Patient ambulates 500' on indoor & paved  surfaces with cane & prosthesis with supervision.     Time  1    Period  Months    Status  New    Target Date  03/11/18      PT SHORT TERM GOAL #4   Title  Patient negotiates ramps, curbs & stairs with single rail with prosthesis & cane with supervision.     Time  1    Period  Months    Status  New    Target Date  03/11/18      PT SHORT TERM GOAL #5   Title  Patient able to lift 10# box with prosthesis with supervision.     Time  1    Period  Months    Status  New    Target Date  03/11/18        PT Long Term Goals - 02/10/18 2018  PT LONG TERM GOAL #1   Title  Patient verbalizes & demonstrates understanding of proper prosthetic care to enable safe use of prosthesis. (All LTGs Target Date: 05/11/2018)    Time  3    Period  Months    Status  New    Target Date  05/11/18      PT LONG TERM GOAL #2   Title  Patient tolerates prosthesis wear >90% of awake hours without skin issues or limb pain to enable function throughout his day.     Time  3    Period  Months    Status  New    Target Date  05/11/18      PT LONG TERM GOAL #3   Title  Berg Balance >45/56 to lower fall risk.     Time  3    Period  Months    Status  New    Target Date  05/11/18      PT LONG TERM GOAL #4   Title  Patient ambulates 1000' outdoors including grass, ramps & curbs with prosthesis & cane or less modified independent for community mobility.     Time  3    Period  Months    Status  New    Target Date  05/11/18      PT LONG TERM GOAL #5   Title  Patient able to lift & carry 25# box and push/pull with prosthesis to enable to perform yard work.     Time  3    Period  Months    Status  New    Target Date  05/11/18             Plan - 02/10/18 1957    Clinical Impression Statement  This 42yo male underwent a right Transtibial Amputation on 07/14/2017 and received his first prosthesis on 01/25/2018. He is dependent in proper prosthetic care which increases risk of skin issues & limb pain.  He has worn prosthesis 16 of 16 days but for only 30 minutes 1-2x/day. Limited wear of prosthesis limits function & increases fall risk. Patient has history of falls. Patient has high fall risk as noted by [employer] 30/56. Patient arrived to PT evaluation ambulating with crutches & prosthesis with partial weight on prosthesis with gait deviations. PT assessed prosthetic gait with cane quad tip with minimal assist with gait deviations limiting weight on prosthesis. Dynamic Gait Index 8/24 indicates high fall risk. PT assessed limited gait without device with Timed Up & Go 12.14 sec with minimal assist. Patient would benefit from skilled PT care to improve function & safety with his Transtibial prosthesis.     History and Personal Factors relevant to plan of care:  R TTA, left toe amputations, DM2, CKD3, DDD lumbar LBP, HTN, PVD, polysubstance abuse,     Clinical Presentation  Evolving    Clinical Presentation due to:  High fall risk, dependency in prosthetic care with risk of skin breakdown & hx of DM, Hx of LBP with deconditioning from limited mobility over last 6 months    Clinical Decision Making  Moderate    Rehab Potential  Good    PT Frequency  2x / week    PT Duration  Other (comment)   3 months (13 weeks)   PT Treatment/Interventions  ADLs/Self Care Home Management;Canalith Repostioning;DME Instruction;Gait training;Stair training;Functional mobility training;Therapeutic activities;Therapeutic exercise;Balance training;Neuromuscular re-education;Patient/family education;Prosthetic Training;Manual techniques;Vestibular    PT Next Visit Plan  instruct in HEP at sink to reestablish midline &  balance, review prosthetic care, instruct in negotiating stairs, ramps & curbs with TTA prosthesis & crutches.     Consulted and Agree with Plan of Care  Patient;Family member/caregiver    Family Member Consulted  wife       Patient will benefit from skilled therapeutic intervention in order to improve the  following deficits and impairments:  Abnormal gait, Decreased activity tolerance, Decreased balance, Decreased endurance, Decreased knowledge of use of DME, Decreased mobility, Decreased strength, Dizziness, Postural dysfunction, Prosthetic Dependency, Pain  Visit Diagnosis: Unsteadiness on feet  Other abnormalities of gait and mobility  Abnormal posture  Muscle weakness (generalized)  History of falling     Problem List Patient Active Problem List   Diagnosis Date Noted  . Chronic anemia   . Diabetes mellitus type 2 with complications, uncontrolled (HCC)   . Diabetic peripheral neuropathy (HCC)   . PVD (peripheral vascular disease) (HCC)   . Hypokalemia   . Acute blood loss anemia   . Post-operative pain   . Unilateral complete BKA, right, subsequent encounter (HCC)   . Diabetic polyneuropathy associated with type 2 diabetes mellitus (HCC) 07/10/2016  . Polysubstance abuse (HCC) 03/08/2016  . Tobacco abuse 02/27/2016  . Gastroparesis 05/28/2015  . GERD (gastroesophageal reflux disease) 10/01/2014  . Esophageal reflux   . Fever 09/27/2014  . Abdominal pain, lower 09/27/2014  . Abnormal ECG 05/30/2014  . Chest pain 05/30/2014  . Ankle pain 05/30/2014  . Pain in the chest   . Nausea 08/30/2013  . Epigastric abdominal pain 08/30/2013  . Low grade fever 08/30/2013  . Diabetic osteomyelitis b/l toes 03/23/2013  . DM (diabetes mellitus) type II uncontrolled, periph vascular disorder (HCC) 03/23/2013  . CKD (chronic kidney disease), stage III (HCC) 03/23/2013  . Anemia 03/23/2013  . Benign essential HTN 03/23/2013  . AKI (acute kidney injury) (HCC) 03/22/2013  . Viral gastroenteritis 09/17/2012  . Nausea & vomiting 09/16/2012  . Acute pancreatitis 09/16/2012  . Diabetes mellitus (HCC) 09/20/2010  . Onychomycosis 09/20/2010  . METHICILLIN SUSCEPTIBLE STAPH AUREUS SEPTICEMIA 07/30/2010    Vladimir Faster PT, DPT 02/10/2018, 8:28 PM  [facility] [address] [place], [place], [address] Phone: [phone]   Fax:  [phone]  Name: [name] MRN: [id number] Date of Birth: 07-Jan-1976

## 2018-02-11 ENCOUNTER — Encounter: Payer: Self-pay | Admitting: Rehabilitation

## 2018-02-11 ENCOUNTER — Ambulatory Visit: Payer: PPO | Admitting: Rehabilitation

## 2018-02-11 DIAGNOSIS — R2681 Unsteadiness on feet: Secondary | ICD-10-CM

## 2018-02-11 DIAGNOSIS — M6281 Muscle weakness (generalized): Secondary | ICD-10-CM

## 2018-02-11 DIAGNOSIS — R2689 Other abnormalities of gait and mobility: Secondary | ICD-10-CM

## 2018-02-11 DIAGNOSIS — R293 Abnormal posture: Secondary | ICD-10-CM

## 2018-02-11 NOTE — Therapy (Signed)
[facility] [address] [place], [place], [address] Phone: [phone]   Fax:  [phone]  Physical Therapy Treatment  Patient Details  Name: [name] MRN: [id number] Date of Birth: 29-Mar-1976 Referring Provider: Aldean Baker, MD   Encounter Date: 02/11/2018  PT End of Session - 02/11/18 1546    Visit Number  2    Number of Visits  26    Date for PT Re-Evaluation  05/11/18    Authorization Type  [employer]    Authorization Time Period  $15 co-pay, $3400oop $1702. ,  VL: follow Medicare guidelines    PT Start Time  1404    PT Stop Time  1446    PT Time Calculation (min)  42 min    Equipment Utilized During Treatment  Gait belt    Activity Tolerance  Patient tolerated treatment well    Behavior During Therapy  WFL for tasks assessed/performed       Past Medical History:  Diagnosis Date  . Acquired contracture of Achilles tendon, right   . Acute osteomyelitis, ankle and foot 07/30/2010   Qualifier: Diagnosis of  By: Daiva Eves MD, Remi Haggard    . Anemia   . Chronic kidney disease (CKD), stage III (moderate) (HCC)   . Chronic osteomyelitis of right foot (HCC)   . Collagen vascular disease (HCC)   . DDD (degenerative disc disease), lumbar   . Dehiscence of amputation stump (HCC)     dehiscence right transmetetarsal amputation achilles contracture  . Diabetic foot ulcer (HCC) 05/14/2017  . Diabetic foot ulcer with osteomyelitis (HCC) 05/12/2013  . Gastroparesis   . GERD (gastroesophageal reflux disease)   . Headache   . Hiatal hernia   . Hyperlipidemia   . Hypertension   . Pancreatitis   . Peripheral vascular disease (HCC)   . Polysubstance abuse (HCC) 03/08/2016  . Renal insufficiency   . Status post transmetatarsal amputation of foot, right (HCC) 07/10/2016  . Type II diabetes mellitus (HCC) dx'd ~ 1996  . Vascular disease    poor circulation to left foot    Past Surgical History:  Procedure  Laterality Date  . AMPUTATION  04/25/2011   Procedure: AMPUTATION DIGIT;  Surgeon: Nadara Mustard, MD;  Location: [facility] OR;  Service: Orthopedics;  Laterality: Left;  Left foot 3rd toe amputation MTP joint, Gastroc Recession  Achilles Lengthening   . AMPUTATION Bilateral 03/25/2013   Procedure: AMPUTATION RAY;  Surgeon: Nadara Mustard, MD;  Location: [facility] OR;  Service: Orthopedics;  Laterality: Bilateral;  Left Great Toe Amputation at  MTP Joint, Right 1st and 2nd Ray Amputation   . AMPUTATION Right 10/03/2014   Procedure: AMPUTATION MIDFOOT;  Surgeon: Nadara Mustard, MD;  Location: [facility] OR;  Service: Orthopedics;  Laterality: Right;  . AMPUTATION Right 07/14/2017   Procedure: RIGHT BELOW KNEE AMPUTATION;  Surgeon: Nadara Mustard, MD;  Location: [facility] OR;  Service: Orthopedics;  Laterality: Right;  . LAPAROSCOPIC CHOLECYSTECTOMY    . STUMP REVISION Right 05/23/2016   Procedure: Revision Right Transmetatarsal Amputation, Right Gastrocnemius Recession;  Surgeon: Nadara Mustard, MD;  Location: [facility] OR;  Service: Orthopedics;  Laterality: Right;  . STUMP REVISION Right 05/15/2017   Procedure: REVISION RIGHT TRANSMETATARSAL AMPUTATION;  Surgeon: Nadara Mustard, MD;  Location: [facility] OR;  Service: Orthopedics;  Laterality: Right;  . TOE AMPUTATION  2012   left foot; great toe and second toe    There were no vitals filed for this visit.  Subjective  Assessment - 02/11/18 1405    Subjective  Pt reports some pain in R end of residual limb.      Pertinent History  R TTA, left toe amputations, DM2, CKD3, DDD lumbar LBP, HTN, PVD, polysubstance abuse,     Limitations  Lifting;Standing;Walking;House hold activities    Patient Stated Goals  He wants to use prosthesis to walk, mowing grass, church  activities, basketball    Currently in Pain?  Yes    Pain Score  2     Pain Location  Leg    Pain Orientation  Right    Pain Descriptors / Indicators  Aching    Pain Type  Acute pain    Pain Onset  More than a month ago     Aggravating Factors   walking    Pain Relieving Factors  getting off of leg                        [facility] Adult PT Treatment/Exercise - 02/11/18 1405      Transfers   Transfers  Sit to Stand;Stand to Sit    Sit to Stand  5: Supervision;4: Min guard    Sit to Stand Details  Verbal cues for sequencing;Verbal cues for technique;Manual facilitation for placement    Sit to Stand Details (indicate cue type and reason)  Performed x 10 reps throughout session with emphasis on foot placement and equal WB.  Note that pt unable to get knee fully flexed due to being too deep into socket, see self care regarding education on this.  Cues for hand placement on lap/R arm rest to increase R lateral weight shift with transitional movement.     Stand to Sit  5: Supervision;With upper extremity assist;With armrests;To chair/3-in-1    Stand to Sit Details (indicate cue type and reason)  Verbal cues for sequencing;Verbal cues for technique      High Level Balance   High Level Balance Comments  Sink exercises to improve balance and weight shift onto R prosthesis.  Note that pt was unable to perform exercises facing sink due to poor technique, therefore modified them and had him stand with R hip close to sink with feet shoulder width apart (did recommend tape on floor to line up with midline of body).  Shifting onto R LE (move hips towards counter) x 10 reps, placing R palm on upper cabinets and sliiding hand up cabinet to increase R lateral weight shift x 20 reps (needing max repetition for techique), shifting onto RLE then crossing body with LUE to touch upper cabinets on the R x 10 reps.  Pt needing max verbal and tactile cues initially, however with repetition was able to perform correctly and reports moderate fatigue.        Prosthetics   Prosthetic Care Comments   PT continues to educate on prosthetic wear schedule 2hrs/2x per day and to increase to 3 hrs in 3 more days if tolerating well.  Also  provided education on sweat and how this can cause skin breakdown if not addressed.  Recommended (and showed pt) Secret clinical strength anti-perspirant to reduce sweating to residual limb.  Pt verbalized understanding.  Also educated on ply sock additons that need to be made during the day.  Pt now increasing wear time and during our session was noted to be too deep inside of socket.  He did not have socks with him, therefore educated to add socks when he gets home.  Pt verbalized understanding.      Current prosthetic wear tolerance (days/week)   Wearing daily    Current prosthetic wear tolerance (#hours/day)   2 hrs, 2 times today    Current prosthetic weight-bearing tolerance (hours/day)   Pt tolerated most of session in standing (at least 25-30 mins in partial weight bearing with only mild pain, but moderate fatigue.     Education Provided  Residual limb care;Correct ply sock adjustment;Proper wear schedule/adjustment;Proper weight-bearing schedule/adjustment    Person(s) Educated  Patient    Education Method  Explanation;Demonstration;Handout    Education Method  Verbalized understanding;Returned demonstration         Do each exercise 2-3  times per day Do each exercise 10 repetitions Hold each exercise for 5 seconds to feel your location  Do these exercises at the counter top in the kitchen at home.    1.  Stand with right hip close to counter top (feet about shoulder width apart) and right foot approx 3-4 inches from the counter.  Stand tall and shift hips (not just shoulders) very slowly towards the counter top.  Hold for 5 secs and return to midline for 5 more secs.  Do this 10 times, 2-3 times per day.   2.  Still standing with right hip close to counter as above,  Place right palm on upper cabinet and slide upwards so that you are shifting onto the right leg.  Hold for 5 secs, return to midline and hold for 5 secs.  Repeat x 10 reps, 2-3 times perday.   3.  Same position as above,  shift onto right leg slowly then reach across your body with left arm and touch upper cabinet.  Hold for 5 secs, return to midline and hold 5 secs.  Do 10 reps, 2-3 times per day.        PT Education - 02/11/18 1546    Education Details  see prosthetic care, sink exercises    Person(s) Educated  Patient    Methods  Explanation;Demonstration;Handout    Comprehension  Verbalized understanding;Returned demonstration       PT Short Term Goals - 02/10/18 2024      PT SHORT TERM GOAL #1   Title  Patient demonstrates proper donning of prosthesis and verbalizes proper cleaning. (All STGs Target Date: 03/11/2018)    Time  1    Period  Months    Status  New    Target Date  03/11/18      PT SHORT TERM GOAL #2   Title  Patient tolerates prosthesis wear >10 hrs total per day without skin issues.     Time  1    Period  Months    Status  New    Target Date  03/11/18      PT SHORT TERM GOAL #3   Title  Patient ambulates 500' on indoor & paved surfaces with cane & prosthesis with supervision.     Time  1    Period  Months    Status  New    Target Date  03/11/18      PT SHORT TERM GOAL #4   Title  Patient negotiates ramps, curbs & stairs with single rail with prosthesis & cane with supervision.     Time  1    Period  Months    Status  New    Target Date  03/11/18      PT SHORT TERM GOAL #5   Title  Patient able  to lift 10# box with prosthesis with supervision.     Time  1    Period  Months    Status  New    Target Date  03/11/18        PT Long Term Goals - 02/10/18 2018      PT LONG TERM GOAL #1   Title  Patient verbalizes & demonstrates understanding of proper prosthetic care to enable safe use of prosthesis. (All LTGs Target Date: 05/11/2018)    Time  3    Period  Months    Status  New    Target Date  05/11/18      PT LONG TERM GOAL #2   Title  Patient tolerates prosthesis wear >90% of awake hours without skin issues or limb pain to enable function throughout his day.      Time  3    Period  Months    Status  New    Target Date  05/11/18      PT LONG TERM GOAL #3   Title  Berg Balance >45/56 to lower fall risk.     Time  3    Period  Months    Status  New    Target Date  05/11/18      PT LONG TERM GOAL #4   Title  Patient ambulates 1000' outdoors including grass, ramps & curbs with prosthesis & cane or less modified independent for community mobility.     Time  3    Period  Months    Status  New    Target Date  05/11/18      PT LONG TERM GOAL #5   Title  Patient able to lift & carry 25# box and push/pull with prosthesis to enable to perform yard work.     Time  3    Period  Months    Status  New    Target Date  05/11/18            Plan - 02/11/18 1547    Clinical Impression Statement  Skilled session focused on initiation of sink HEP to address balance and weight shift onto R prosthesis.  Pt initially needs max verbal and tactile cues for technique, however did very well but with moderate fatigue. Also needs max education on ply sock management as well as residual limb care 2/2 sweating.  Will continue to address.     Rehab Potential  Good    PT Frequency  2x / week    PT Duration  Other (comment)   3 months (13 weeks)   PT Treatment/Interventions  ADLs/Self Care Home Management;Canalith Repostioning;DME Instruction;Gait training;Stair training;Functional mobility training;Therapeutic activities;Therapeutic exercise;Balance training;Neuromuscular re-education;Patient/family education;Prosthetic Training;Manual techniques;Vestibular    PT Next Visit Plan  Review sink exercises (see modifications) review prosthetic care (esp sock additions, how to manage sweat), instruct in negotiating stairs, ramps & curbs with TTA prosthesis & crutches.     Consulted and Agree with Plan of Care  Patient       Patient will benefit from skilled therapeutic intervention in order to improve the following deficits and impairments:  Abnormal gait, Decreased  activity tolerance, Decreased balance, Decreased endurance, Decreased knowledge of use of DME, Decreased mobility, Decreased strength, Dizziness, Postural dysfunction, Prosthetic Dependency, Pain  Visit Diagnosis: Unsteadiness on feet  Other abnormalities of gait and mobility  Abnormal posture  Muscle weakness (generalized)     Problem List Patient Active Problem List   Diagnosis Date Noted  . Chronic  anemia   . Diabetes mellitus type 2 with complications, uncontrolled (HCC)   . Diabetic peripheral neuropathy (HCC)   . PVD (peripheral vascular disease) (HCC)   . Hypokalemia   . Acute blood loss anemia   . Post-operative pain   . Unilateral complete BKA, right, subsequent encounter (HCC)   . Diabetic polyneuropathy associated with type 2 diabetes mellitus (HCC) 07/10/2016  . Polysubstance abuse (HCC) 03/08/2016  . Tobacco abuse 02/27/2016  . Gastroparesis 05/28/2015  . GERD (gastroesophageal reflux disease) 10/01/2014  . Esophageal reflux   . Fever 09/27/2014  . Abdominal pain, lower 09/27/2014  . Abnormal ECG 05/30/2014  . Chest pain 05/30/2014  . Ankle pain 05/30/2014  . Pain in the chest   . Nausea 08/30/2013  . Epigastric abdominal pain 08/30/2013  . Low grade fever 08/30/2013  . Diabetic osteomyelitis b/l toes 03/23/2013  . DM (diabetes mellitus) type II uncontrolled, periph vascular disorder (HCC) 03/23/2013  . CKD (chronic kidney disease), stage III (HCC) 03/23/2013  . Anemia 03/23/2013  . Benign essential HTN 03/23/2013  . AKI (acute kidney injury) (HCC) 03/22/2013  . Viral gastroenteritis 09/17/2012  . Nausea & vomiting 09/16/2012  . Acute pancreatitis 09/16/2012  . Diabetes mellitus (HCC) 09/20/2010  . Onychomycosis 09/20/2010  . METHICILLIN SUSCEPTIBLE STAPH AUREUS SEPTICEMIA 07/30/2010    Harriet Butte, PT, MPT [facility] [address] [place], [place], [address] Phone: [phone]   Fax:   [phone] 02/11/18, 3:50 PM  Name: [name] MRN: [id number] Date of Birth: [birth year]

## 2018-02-11 NOTE — Patient Instructions (Signed)
Do each exercise 2-3  times per day Do each exercise 10 repetitions Hold each exercise for 5 seconds to feel your location  Do these exercises at the counter top in the kitchen at home.    1.  Stand with right hip close to counter top (feet about shoulder width apart) and right foot approx 3-4 inches from the counter.  Stand tall and shift hips (not just shoulders) very slowly towards the counter top.  Hold for 5 secs and return to midline for 5 more secs.  Do this 10 times, 2-3 times per day.   2.  Still standing with right hip close to counter as above,  Place right palm on upper cabinet and slide upwards so that you are shifting onto the right leg.  Hold for 5 secs, return to midline and hold for 5 secs.  Repeat x 10 reps, 2-3 times perday.   3.  Same position as above, shift onto right leg slowly then reach across your body with left arm and touch upper cabinet.  Hold for 5 secs, return to midline and hold 5 secs.  Do 10 reps, 2-3 times per day.

## 2018-02-15 ENCOUNTER — Telehealth: Payer: Self-pay | Admitting: Physical Therapy

## 2018-02-15 ENCOUNTER — Ambulatory Visit: Payer: PPO | Admitting: Physical Therapy

## 2018-02-15 NOTE — Telephone Encounter (Signed)
Left voicemail on pt's phone regarding pt's appointment for 02/15/18 and reminding pt of his next appointment. Hortencia Conradi, PTA  02/15/18, 9:58 AM

## 2018-02-18 ENCOUNTER — Ambulatory Visit: Payer: PPO | Admitting: Rehabilitation

## 2018-02-22 ENCOUNTER — Ambulatory Visit: Payer: PPO | Admitting: Physical Therapy

## 2018-02-24 ENCOUNTER — Ambulatory Visit: Payer: PPO | Admitting: Physical Therapy

## 2018-02-25 ENCOUNTER — Ambulatory Visit: Payer: PPO | Admitting: Rehabilitation

## 2018-03-01 ENCOUNTER — Encounter: Payer: PPO | Admitting: Physical Therapy

## 2018-03-01 ENCOUNTER — Telehealth: Payer: Self-pay | Admitting: Rehabilitation

## 2018-03-01 NOTE — Telephone Encounter (Signed)
PT called pt on 02/25/18 regarding 3rd no show to PT sessions. PT had to leave voicemail stating that remaining appts would be taken out for other patients, however that he could call back to schedule when he felt that he could attend more consistently.    Harriet ButteEmily Raymont Andreoni, PT, MPT [facility] [address] [place], [place], [address] Phone: [phone]   Fax:  [phone] 03/01/18, 10:29 AM

## 2018-03-04 ENCOUNTER — Encounter: Payer: PPO | Admitting: Rehabilitation

## 2018-03-08 ENCOUNTER — Encounter: Payer: PPO | Admitting: Physical Therapy

## 2018-03-11 ENCOUNTER — Encounter: Payer: PPO | Admitting: Physical Therapy

## 2018-03-15 ENCOUNTER — Encounter: Payer: PPO | Admitting: Physical Therapy

## 2018-03-16 ENCOUNTER — Encounter: Payer: PPO | Admitting: Physical Therapy

## 2018-03-22 ENCOUNTER — Encounter: Payer: PPO | Admitting: Rehabilitation

## 2018-03-25 ENCOUNTER — Encounter: Payer: PPO | Admitting: Physical Therapy

## 2018-03-29 ENCOUNTER — Encounter: Payer: PPO | Admitting: Rehabilitation

## 2018-04-01 ENCOUNTER — Encounter: Payer: PPO | Admitting: Physical Therapy

## 2018-04-01 DIAGNOSIS — Z89511 Acquired absence of right leg below knee: Secondary | ICD-10-CM | POA: Diagnosis not present

## 2018-04-01 DIAGNOSIS — Z794 Long term (current) use of insulin: Secondary | ICD-10-CM | POA: Diagnosis not present

## 2018-04-01 DIAGNOSIS — N183 Chronic kidney disease, stage 3 (moderate): Secondary | ICD-10-CM | POA: Diagnosis not present

## 2018-04-01 DIAGNOSIS — N4601 Organic azoospermia: Secondary | ICD-10-CM | POA: Diagnosis not present

## 2018-04-01 DIAGNOSIS — E1122 Type 2 diabetes mellitus with diabetic chronic kidney disease: Secondary | ICD-10-CM | POA: Diagnosis not present

## 2018-04-01 DIAGNOSIS — E785 Hyperlipidemia, unspecified: Secondary | ICD-10-CM | POA: Diagnosis not present

## 2018-04-01 DIAGNOSIS — I10 Essential (primary) hypertension: Secondary | ICD-10-CM | POA: Insufficient documentation

## 2018-04-01 DIAGNOSIS — E1169 Type 2 diabetes mellitus with other specified complication: Secondary | ICD-10-CM | POA: Diagnosis not present

## 2018-04-01 DIAGNOSIS — I129 Hypertensive chronic kidney disease with stage 1 through stage 4 chronic kidney disease, or unspecified chronic kidney disease: Secondary | ICD-10-CM | POA: Diagnosis not present

## 2018-04-01 DIAGNOSIS — D649 Anemia, unspecified: Secondary | ICD-10-CM | POA: Diagnosis not present

## 2018-04-01 DIAGNOSIS — K3184 Gastroparesis: Secondary | ICD-10-CM | POA: Diagnosis not present

## 2018-04-01 DIAGNOSIS — F17210 Nicotine dependence, cigarettes, uncomplicated: Secondary | ICD-10-CM | POA: Diagnosis not present

## 2018-04-05 ENCOUNTER — Encounter: Payer: PPO | Admitting: Physical Therapy

## 2018-04-08 ENCOUNTER — Encounter: Payer: PPO | Admitting: Physical Therapy

## 2018-04-08 DIAGNOSIS — N5313 Anejaculatory orgasm: Secondary | ICD-10-CM | POA: Diagnosis not present

## 2018-04-08 DIAGNOSIS — N4601 Organic azoospermia: Secondary | ICD-10-CM | POA: Diagnosis not present

## 2018-04-08 DIAGNOSIS — N5089 Other specified disorders of the male genital organs: Secondary | ICD-10-CM | POA: Diagnosis not present

## 2018-04-08 DIAGNOSIS — E119 Type 2 diabetes mellitus without complications: Secondary | ICD-10-CM | POA: Diagnosis not present

## 2018-04-12 ENCOUNTER — Encounter: Payer: PPO | Admitting: Physical Therapy

## 2018-04-12 ENCOUNTER — Other Ambulatory Visit: Payer: Self-pay

## 2018-04-12 ENCOUNTER — Encounter (HOSPITAL_COMMUNITY): Payer: Self-pay | Admitting: Emergency Medicine

## 2018-04-12 ENCOUNTER — Emergency Department (HOSPITAL_COMMUNITY)
Admission: EM | Admit: 2018-04-12 | Discharge: 2018-04-12 | Disposition: A | Discharge status: Home / Self Care | Payer: PPO | Attending: Emergency Medicine | Admitting: Emergency Medicine

## 2018-04-12 ENCOUNTER — Emergency Department (HOSPITAL_COMMUNITY): Payer: PPO

## 2018-04-12 DIAGNOSIS — N183 Chronic kidney disease, stage 3 (moderate): Secondary | ICD-10-CM | POA: Insufficient documentation

## 2018-04-12 DIAGNOSIS — Z7982 Long term (current) use of aspirin: Secondary | ICD-10-CM | POA: Insufficient documentation

## 2018-04-12 DIAGNOSIS — K209 Esophagitis, unspecified without bleeding: Secondary | ICD-10-CM

## 2018-04-12 DIAGNOSIS — E1122 Type 2 diabetes mellitus with diabetic chronic kidney disease: Secondary | ICD-10-CM | POA: Diagnosis not present

## 2018-04-12 DIAGNOSIS — Z79899 Other long term (current) drug therapy: Secondary | ICD-10-CM | POA: Insufficient documentation

## 2018-04-12 DIAGNOSIS — I129 Hypertensive chronic kidney disease with stage 1 through stage 4 chronic kidney disease, or unspecified chronic kidney disease: Secondary | ICD-10-CM | POA: Insufficient documentation

## 2018-04-12 DIAGNOSIS — Z87891 Personal history of nicotine dependence: Secondary | ICD-10-CM | POA: Diagnosis not present

## 2018-04-12 DIAGNOSIS — Z794 Long term (current) use of insulin: Secondary | ICD-10-CM | POA: Diagnosis not present

## 2018-04-12 DIAGNOSIS — R109 Unspecified abdominal pain: Secondary | ICD-10-CM

## 2018-04-12 DIAGNOSIS — R112 Nausea with vomiting, unspecified: Secondary | ICD-10-CM | POA: Insufficient documentation

## 2018-04-12 DIAGNOSIS — R1031 Right lower quadrant pain: Secondary | ICD-10-CM | POA: Diagnosis not present

## 2018-04-12 LAB — URINALYSIS, ROUTINE W REFLEX MICROSCOPIC
BILIRUBIN URINE: NEGATIVE
Bacteria, UA: NONE SEEN
Glucose, UA: NEGATIVE mg/dL
Hgb urine dipstick: NEGATIVE
Ketones, ur: 20 mg/dL — AB
LEUKOCYTES UA: NEGATIVE
Nitrite: NEGATIVE
Protein, ur: 100 mg/dL — AB
Specific Gravity, Urine: 1.04 — ABNORMAL HIGH (ref 1.005–1.030)
pH: 5 (ref 5.0–8.0)

## 2018-04-12 LAB — COMPREHENSIVE METABOLIC PANEL
ALBUMIN: 3.9 g/dL (ref 3.5–5.0)
ALT: 17 U/L (ref 0–44)
AST: 20 U/L (ref 15–41)
Alkaline Phosphatase: 94 U/L (ref 38–126)
Anion gap: 9 (ref 5–15)
BUN: 21 mg/dL — ABNORMAL HIGH (ref 6–20)
CHLORIDE: 98 mmol/L (ref 98–111)
CO2: 30 mmol/L (ref 22–32)
CREATININE: 1.76 mg/dL — AB (ref 0.61–1.24)
Calcium: 9.6 mg/dL (ref 8.9–10.3)
GFR calc non Af Amer: 46 mL/min — ABNORMAL LOW (ref >60)
GFR, EST AFRICAN AMERICAN: 54 mL/min — AB (ref >60)
GLUCOSE: 169 mg/dL — AB (ref 70–99)
Potassium: 3.7 mmol/L (ref 3.5–5.1)
SODIUM: 137 mmol/L (ref 135–145)
Total Bilirubin: 1 mg/dL (ref 0.3–1.2)
Total Protein: 8.3 g/dL — ABNORMAL HIGH (ref 6.5–8.1)

## 2018-04-12 LAB — CBC
HCT: 43.5 % (ref 39.0–52.0)
Hemoglobin: 14.4 g/dL (ref 13.0–17.0)
MCH: 29.9 pg (ref 26.0–34.0)
MCHC: 33.1 g/dL (ref 30.0–36.0)
MCV: 90.4 fL (ref 80.0–100.0)
NRBC: 0 % (ref 0.0–0.2)
Platelets: 247 10*3/uL (ref 150–400)
RBC: 4.81 MIL/uL (ref 4.22–5.81)
RDW: 13.4 % (ref 11.5–15.5)
WBC: 12 10*3/uL — ABNORMAL HIGH (ref 4.0–10.5)

## 2018-04-12 LAB — LIPASE, BLOOD: Lipase: 29 U/L (ref 11–51)

## 2018-04-12 MED ORDER — SUCRALFATE 1 G PO TABS: 1.0000 g | ORAL_TABLET | Freq: Three times a day (TID) | ORAL | 0 refills | Status: DC

## 2018-04-12 MED ORDER — ONDANSETRON 4 MG PO TBDP: 4.0000 mg | ORAL_TABLET | Freq: Three times a day (TID) | ORAL | 0 refills | Status: DC | PRN

## 2018-04-12 MED ORDER — ONDANSETRON HCL 4 MG/2ML IJ SOLN
4.0000 mg | Freq: Once | INTRAMUSCULAR | Status: AC
  Administered 2018-04-12: 4 mg via INTRAVENOUS
  Filled 2018-04-12: qty 2

## 2018-04-12 MED ORDER — ALUM & MAG HYDROXIDE-SIMETH 200-200-20 MG/5ML PO SUSP
15.0000 mL | Freq: Once | ORAL | Status: DC
  Filled 2018-04-12: qty 30

## 2018-04-12 MED ORDER — SODIUM CHLORIDE 0.9 % IV BOLUS
1000.0000 mL | Freq: Once | INTRAVENOUS | Status: AC
  Administered 2018-04-12: 1000 mL via INTRAVENOUS

## 2018-04-12 MED ORDER — FAMOTIDINE 20 MG PO TABS: 20.0000 mg | ORAL_TABLET | Freq: Two times a day (BID) | ORAL | 0 refills | Status: DC

## 2018-04-12 MED ORDER — HYDROMORPHONE HCL 1 MG/ML IJ SOLN
1.0000 mg | Freq: Once | INTRAMUSCULAR | Status: AC
  Administered 2018-04-12: 1 mg via INTRAVENOUS
  Filled 2018-04-12: qty 1

## 2018-04-12 MED ORDER — IOHEXOL 300 MG/ML  SOLN
100.0000 mL | Freq: Once | INTRAMUSCULAR | Status: AC | PRN
  Administered 2018-04-12: 100 mL via INTRAVENOUS

## 2018-04-12 MED ORDER — ALUM & MAG HYDROXIDE-SIMETH 200-200-20 MG/5ML PO SUSP
15.0000 mL | Freq: Once | ORAL | Status: AC
  Administered 2018-04-12: 15 mL via ORAL

## 2018-04-12 NOTE — ED Triage Notes (Signed)
Pt to ER for evaluation of RLQ abdominal pain onset 3-4 days ago after having surgical procedure on testicles for a biopsy. Reports nausea and vomiting, onset prior to pain. Last BM two days ago, per patient was normal. A/o x4.

## 2018-04-12 NOTE — ED Notes (Signed)
Patient verbalizes understanding of discharge instructions. Opportunity for questioning and answers were provided. Armband removed by staff, pt discharged from ED.  

## 2018-04-12 NOTE — Discharge Instructions (Addendum)
Please read instructions below. Drink clear liquids until your stomach feels better. Then, slowly introduce bland foods into your diet as tolerated, such as bread, rice, apples, bananas. Take pepcid twice daily.  You can take zofran every 8 hours as needed for nausea. You can take tylenol as needed for pain. Avoid ibuprofen/Advil/Motrin/BC powder/Goody powder. Schedule an appointment with a gastroenterologist for further evaluation of your recurrent episodes of abdominal pain with N/V. Return to the ER for severe abdominal pain, fever, uncontrollable vomiting, or new or concerning symptoms.

## 2018-04-12 NOTE — ED Notes (Signed)
Pt transported to CT ?

## 2018-04-12 NOTE — ED Provider Notes (Signed)
[facility] EMERGENCY DEPARTMENT Provider Note   CSN: [id number] Arrival date & time: 04/12/18  0938     History   Chief Complaint Chief Complaint  Patient presents with  . Abdominal Pain    HPI [name] is a 42 y.o. male w PMHx uncontrolled DM, CKD, polysubstance abuse, presenting to the ED with gradual onset of RLQ abdominal pain since Thursday. Pain is described as sharp and achy, not worse with movement. Pt states he has had assoc nausea with NBNB emesis since Thursday. Last BM with 2 days ago, normal. Has not had BM since nor has he had flatulence. States he feels weak because he has not had anything to eat in 4 days. Reports assoc intermittent subjective low-grade fevers. Has had an episode of similar sx in the past, however without diagnosis. Denies urinary sx or diarrhea. Reports recent procedure done on right testicle, sperm biopsy done outpatient for fertility testing. Reports assoc pain and swelling to right testicle, which he reports was expected after procedure. Denies purulent drainage with surgical wound.   The history is provided by the patient.    Past Medical History:  Diagnosis Date  . Acquired contracture of Achilles tendon, right   . Acute osteomyelitis, ankle and foot 07/30/2010   Qualifier: Diagnosis of  By: Tommy Medal MD, Roderic Scarce    . Anemia   . Chronic kidney disease (CKD), stage III (moderate) (HCC)   . Chronic osteomyelitis of right foot ([place])   . Collagen vascular disease ([place])   . DDD (degenerative disc disease), lumbar   . Dehiscence of amputation stump (HCC)     dehiscence right transmetetarsal amputation achilles contracture  . Diabetic foot ulcer ([place]) 05/14/2017  . Diabetic foot ulcer with osteomyelitis ([place]) 05/12/2013  . Gastroparesis   . GERD (gastroesophageal reflux disease)   . Headache   . Hiatal hernia   . Hyperlipidemia   . Hypertension   . Pancreatitis   . Peripheral vascular disease ([place])   . Polysubstance  abuse ([place]) 03/08/2016  . Renal insufficiency   . Status post transmetatarsal amputation of foot, right ([place]) 07/10/2016  . Type II diabetes mellitus ([place]) dx'd ~ 1996  . Vascular disease    poor circulation to left foot    Patient Active Problem List   Diagnosis Date Noted  . Chronic anemia   . Diabetes mellitus type 2 with complications, uncontrolled ([place])   . Diabetic peripheral neuropathy ([place])   . PVD (peripheral vascular disease) ([place])   . Hypokalemia   . Acute blood loss anemia   . Post-operative pain   . Unilateral complete BKA, right, subsequent encounter ([place])   . Diabetic polyneuropathy associated with type 2 diabetes mellitus ([place]) 07/10/2016  . Polysubstance abuse ([place]) 03/08/2016  . Tobacco abuse 02/27/2016  . Gastroparesis 05/28/2015  . GERD (gastroesophageal reflux disease) 10/01/2014  . Esophageal reflux   . Fever 09/27/2014  . Abdominal pain, lower 09/27/2014  . Abnormal ECG 05/30/2014  . Chest pain 05/30/2014  . Ankle pain 05/30/2014  . Pain in the chest   . Nausea 08/30/2013  . Epigastric abdominal pain 08/30/2013  . Low grade fever 08/30/2013  . Diabetic osteomyelitis b/l toes 03/23/2013  . DM (diabetes mellitus) type II uncontrolled, periph vascular disorder ([place]) 03/23/2013  . CKD (chronic kidney disease), stage III ([place]) 03/23/2013  . Anemia 03/23/2013  . Benign essential HTN 03/23/2013  . AKI (acute kidney injury) ([place]) 03/22/2013  . Viral gastroenteritis 09/17/2012  . Nausea &  vomiting 09/16/2012  . Acute pancreatitis 09/16/2012  . Diabetes mellitus ([place]) 09/20/2010  . Onychomycosis 09/20/2010  . METHICILLIN SUSCEPTIBLE STAPH AUREUS SEPTICEMIA 07/30/2010    Past Surgical History:  Procedure Laterality Date  . AMPUTATION  04/25/2011   Procedure: AMPUTATION DIGIT;  Surgeon: Newt Minion, MD;  Location: [facility];  Service: Orthopedics;  Laterality: Left;  Left foot 3rd toe amputation MTP joint, Gastroc Recession  Achilles Lengthening   . AMPUTATION  Bilateral 03/25/2013   Procedure: AMPUTATION RAY;  Surgeon: Newt Minion, MD;  Location: [facility];  Service: Orthopedics;  Laterality: Bilateral;  Left Great Toe Amputation at  MTP Joint, Right 1st and 2nd Ray Amputation   . AMPUTATION Right 10/03/2014   Procedure: AMPUTATION MIDFOOT;  Surgeon: Newt Minion, MD;  Location: [facility];  Service: Orthopedics;  Laterality: Right;  . AMPUTATION Right 07/14/2017   Procedure: RIGHT BELOW KNEE AMPUTATION;  Surgeon: Newt Minion, MD;  Location: [facility];  Service: Orthopedics;  Laterality: Right;  . LAPAROSCOPIC CHOLECYSTECTOMY    . STUMP REVISION Right 05/23/2016   Procedure: Revision Right Transmetatarsal Amputation, Right Gastrocnemius Recession;  Surgeon: Newt Minion, MD;  Location: [facility];  Service: Orthopedics;  Laterality: Right;  . STUMP REVISION Right 05/15/2017   Procedure: REVISION RIGHT TRANSMETATARSAL AMPUTATION;  Surgeon: Newt Minion, MD;  Location: [facility];  Service: Orthopedics;  Laterality: Right;  . TOE AMPUTATION  2012   left foot; great toe and second toe        Home Medications    Prior to Admission medications   Medication Sig Start Date End Date Taking? Authorizing Provider  amLODipine (NORVASC) 10 MG tablet Take 10 mg by mouth daily. 04/07/18  Yes [provider]  aspirin 325 MG EC tablet Take 650 mg by mouth once.   Yes [provider]  atorvastatin (LIPITOR) 10 MG tablet Take 10 mg by mouth daily. 03/29/17  Yes [provider]  insulin aspart (NOVOLOG) 100 UNIT/ML injection Inject 8 Units into the skin 3 (three) times daily with meals. Patient taking differently: Inject 3 Units into the skin 3 (three) times daily with meals.  07/17/17  Yes Nita Sells, MD  omeprazole (PRILOSEC OTC) 20 MG tablet Take 20 mg by mouth as needed (indigestion).   Yes [provider]  oxyCODONE-acetaminophen (PERCOCET) 10-325 MG tablet Take 1 tablet by mouth every 8 (eight) hours as needed for pain. 04/08/18   Yes [provider]  Pseudoeph-Doxylamine-DM-APAP (NYQUIL MULTI-SYMPTOM PO) Take 15 mLs by mouth as needed (cold symptoms).   Yes [provider]  amLODipine (NORVASC) 5 MG tablet Take 1 tablet (5 mg total) by mouth daily. Patient not taking: Reported on 04/12/2018 07/17/17   Nita Sells, MD  blood glucose meter kit and supplies KIT Dispense based on patient and insurance preference. Use up to four times daily as directed. (FOR ICD-9 250.00, 250.01). 05/16/17   Rai, Vernelle Emerald, MD  famotidine (PEPCID) 20 MG tablet Take 1 tablet (20 mg total) by mouth 2 (two) times daily. 04/12/18   Robinson, [place] N, PA-C  insulin glargine (LANTUS) 100 UNIT/ML injection Inject 0.05 mLs (5 Units total) into the skin daily. Patient taking differently: Inject 8 Units into the skin daily.  07/17/17   Nita Sells, MD  Insulin Syringes, Disposable, U-100 0.5 ML MISC Use with lantus and novolog vials. 05/16/17   Rai, Vernelle Emerald, MD  metoCLOPramide (REGLAN) 10 MG tablet Take 1 tablet (10 mg total) by mouth every 6 (  six) hours. Patient not taking: Reported on 02/10/2018 08/15/17   McDonald, Mia A, PA-C  ondansetron (ZOFRAN ODT) 4 MG disintegrating tablet Take 1 tablet (4 mg total) by mouth every 8 (eight) hours as needed for nausea or vomiting. 04/12/18   Robinson, [place] N, PA-C  oxyCODONE-acetaminophen (PERCOCET/ROXICET) 5-325 MG tablet Take 1 tablet by mouth every 8 (eight) hours as needed for severe pain. Patient not taking: Reported on 02/10/2018 08/18/17   Newt Minion, MD  sucralfate (CARAFATE) 1 g tablet Take 1 tablet (1 g total) by mouth 4 (four) times daily -  with meals and at bedtime. 04/12/18   Robinson, [place] N, PA-C  pantoprazole (PROTONIX) 40 MG tablet Take 1 tablet (40 mg total) by mouth 2 (two) times daily. Patient not taking: Reported on 10/01/2014 09/30/14 05/26/15  Barton Dubois, MD    Family History Family History  Problem Relation Age of Onset  . Heart attack Father 75  .  Hypertension Sister     Social History Social History   Tobacco Use  . Smoking status: Former Smoker    Packs/day: 0.10    Years: 4.00    Pack years: 0.40    Types: Cigarettes    Last attempt to quit: 06/10/2015    Years since quitting: 2.8  . Smokeless tobacco: Never Used  Substance Use Topics  . Alcohol use: No  . Drug use: Yes    Frequency: 10.0 times per week    Types: Marijuana     Allergies   No known allergies   Review of Systems Review of Systems  Constitutional: Positive for appetite change and fever (Subjective).       Generalized weakness  Gastrointestinal: Positive for abdominal pain, nausea and vomiting. Negative for blood in stool and diarrhea.  Genitourinary: Positive for scrotal swelling and testicular pain. Negative for dysuria, frequency and penile pain.  All other systems reviewed and are negative.    Physical Exam Updated Vital Signs BP 123/86 (BP Location: Right Arm)   Pulse 80   Temp 98.3 F (36.8 C) (Oral)   Resp 18   SpO2 99%   Physical Exam  Constitutional: He appears well-developed and well-nourished.  HENT:  Head: Normocephalic and atraumatic.  Eyes: Conjunctivae are normal.  Cardiovascular: Normal rate, regular rhythm and normal heart sounds.  Pulmonary/Chest: Effort normal and breath sounds normal.  Abdominal: Soft. He exhibits no distension and no mass. There is tenderness. There is no guarding.  Decreased bowel sounds.  Generalized abdominal tenderness, though worse in the right lower quadrant. Old surgical scars status post cholecystectomy  Genitourinary:  Genitourinary Comments: Exam performed with male RN chaperone present.  There is a healing surgical wound to the right scrotum with what appears to be skin glue over top.  Right testicle is generally swollen and tender with some tenderness to the scrotum as well.  Wound does not appear infected, no surrounding erythema or obvious purulent drainage.  Neurological: He is alert.    Skin: Skin is warm.  Psychiatric: He has a normal mood and affect. His behavior is normal.  Nursing note and vitals reviewed.    ED Treatments / Results  Labs (all labs ordered are listed, but only abnormal results are displayed) Labs Reviewed  COMPREHENSIVE METABOLIC PANEL - Abnormal; Notable for the following components:      Result Value   Glucose, Bld 169 (*)    BUN 21 (*)    Creatinine, Ser 1.76 (*)    Total Protein 8.3 (*)  GFR calc non Af Amer 46 (*)    GFR calc Af Amer 54 (*)    All other components within normal limits  CBC - Abnormal; Notable for the following components:   WBC 12.0 (*)    All other components within normal limits  URINALYSIS, ROUTINE W REFLEX MICROSCOPIC - Abnormal; Notable for the following components:   Specific Gravity, Urine 1.040 (*)    Ketones, ur 20 (*)    Protein, ur 100 (*)    All other components within normal limits  LIPASE, BLOOD    EKG None  Radiology Ct Abdomen Pelvis W Contrast  Result Date: 04/12/2018 CLINICAL DATA:  42 year old with right lower quadrant abdominal pain. EXAM: CT ABDOMEN AND PELVIS WITH CONTRAST TECHNIQUE: Multidetector CT imaging of the abdomen and pelvis was performed using the standard protocol following bolus administration of intravenous contrast. CONTRAST:  123m OMNIPAQUE IOHEXOL 300 MG/ML  [facility] COMPARISON:  01/14/2018 FINDINGS: Lower chest: Few patchy densities in lower lobes are most compatible with atelectasis. No pleural effusions. Wall thickening in the distal esophagus. Esophagus wall appears to measures up to 9 mm. Hepatobiliary: Gallbladder has been removed. No focal abnormality in the liver. The main portal venous system is patent. No biliary dilatation. Pancreas: Unremarkable. No pancreatic ductal dilatation or surrounding inflammatory changes. Spleen: Normal in size without focal abnormality. Adrenals/Urinary Tract: Normal adrenal glands. Low lying and malrotated right kidney without hydronephrosis.  No acute abnormality to the left kidney. Incidentally, there is a bifid left renal collecting system. Gas within the urinary bladder. No definite renal or ureter stones. Stomach/Bowel: High-density material in the right colon and appendix probably related to old contrast material. No acute inflammatory changes involving the appendix. No evidence for bowel dilatation or focal bowel inflammation. Small high-density material in the duodenum. Vascular/Lymphatic: There is at least 1 accessory left renal artery. No significant atherosclerotic disease within the aorta and iliac arteries. Negative for an aortic aneurysm. Again noted are small lymph nodes in the bilateral inguinal regions. Otherwise, no significant lymph node enlargement in the abdomen or pelvis. Reproductive: Prostate is unremarkable. Other: Negative for free fluid.  Negative for free air. Musculoskeletal: No acute bone abnormality. Again noted is extensive inferior endplate disease at L4 and L5 with sclerosis and large Schmorl's nodes. Vacuum disc phenomenon L4-L5 and L5-S1 is similar to the prior previous examination. Disc space narrowing at L5-S1. IMPRESSION: 1. New wall thickening in the distal esophagus. Findings raise concern for esophageal wall inflammation and esophagitis. 2. Gas within the urinary bladder. Gas could be iatrogenic if the patient had a recent bladder catheterization. Recommend correlation with urinalysis. 3. Disc disease in lower lumbar spine. Electronically Signed   By: AMarkus DaftM.D.   On: 04/12/2018 09:41    Procedures Procedures (including critical care time)  Medications Ordered in ED Medications  alum & mag hydroxide-simeth (MAALOX/MYLANTA) 200-200-20 MG/5ML suspension 15 mL (15 mLs Oral Not Given 04/12/18 1133)  ondansetron (ZOFRAN) injection 4 mg (4 mg Intravenous Given 04/12/18 0837)  HYDROmorphone (DILAUDID) injection 1 mg (1 mg Intravenous Given 04/12/18 0836)  sodium chloride 0.9 % bolus 1,000 mL (0 mLs  Intravenous Stopped 04/12/18 1132)  iohexol (OMNIPAQUE) 300 MG/ML solution 100 mL (100 mLs Intravenous Contrast Given 04/12/18 0915)  alum & mag hydroxide-simeth (MAALOX/MYLANTA) 200-200-20 MG/5ML suspension 15 mL (15 mLs Oral Given 04/12/18 1214)     Initial Impression / Assessment and Plan / ED Course  I have reviewed the triage vital signs and the nursing notes.  Pertinent labs & imaging results that were available during my care of the patient were reviewed by me and considered in my medical decision making (see chart for details).  Clinical Course as of Apr 13 1355  Mon Apr 12, 2018  1200 Patient reevaluated.  Reports improvement in symptoms.  Discussed reassuring CT results.  Patient will provide urine sample to rule out any urinary tract infection.   [JR]    Clinical Course User Index [JR] Robinson, [place] N, PA-C    Patient with gradual onset of right lower quadrant abdominal pain, nausea, vomiting for 4 days.  History of similar episodes in the past without particular diagnosis.  On exam, vital signs are stable, afebrile.  Abdomen is soft with generalized tenderness, worse in the right lower quadrant.  No peritoneal signs or guarding.  Patient had a recent procedure done on right testicle test his fertility, on exam appears normal amount of swelling after mild surgical procedure.  No evidence of infection to wound.  Doubt this is related.  CBC with mild leukocytosis of 12.  Creatinine appears to be at his baseline.  Electrolytes are otherwise unremarkable.  UA is negative for infection.  Lipase within normal limits.  CT scan was ordered to evaluate for possible bowel obstruction versus appendicitis versus other acute intra-abdominal pathology; CT showing findings consistent with esophagitis though otherwise negative for acute findings.  Esophagitis likely secondary to recurrent episodes of emesis.  Discussed findings with patient and reevaluated.  States his symptoms have improved after  fluids, Dilaudid, Zofran.  Tolerating p.o.  Provided Maalox for esophagitis and will discharge with GI referral for follow-up and further evaluation of his recurrent episodes of abdominal pain with nausea and vomiting.  We will also discharge with H2 blocker and Zofran.  Discussed symptomatic management and strict return precautions.  Patient is safe for discharge at this time.  Patient discussed with Dr. Tyrone Nine, who agrees with care plan.  Discussed results, findings, treatment and follow up. Patient advised of return precautions. Patient verbalized understanding and agreed with plan.  Final Clinical Impressions(s) / ED Diagnoses   Final diagnoses:  Intermittent abdominal pain  Nausea and vomiting in adult  Esophagitis    ED Discharge Orders         Ordered    ondansetron (ZOFRAN ODT) 4 MG disintegrating tablet  Every 8 hours PRN     04/12/18 1318    famotidine (PEPCID) 20 MG tablet  2 times daily     04/12/18 1323    sucralfate (CARAFATE) 1 g tablet  3 times daily with meals & bedtime     04/12/18 1323           Robinson, [place] N, PA-C 04/12/18 Ennis, Williams, DO 04/12/18 1558

## 2018-04-15 ENCOUNTER — Encounter: Payer: PPO | Admitting: Physical Therapy

## 2018-04-19 ENCOUNTER — Encounter: Payer: PPO | Admitting: Physical Therapy

## 2018-04-22 ENCOUNTER — Encounter: Payer: PPO | Admitting: Physical Therapy

## 2018-04-26 ENCOUNTER — Encounter: Payer: PPO | Admitting: Physical Therapy

## 2018-04-26 DIAGNOSIS — E3452 Partial androgen insensitivity syndrome: Secondary | ICD-10-CM | POA: Diagnosis not present

## 2018-04-29 ENCOUNTER — Encounter: Payer: PPO | Admitting: Physical Therapy

## 2018-05-03 ENCOUNTER — Encounter: Payer: PPO | Admitting: Physical Therapy

## 2018-05-05 ENCOUNTER — Encounter: Payer: PPO | Admitting: Physical Therapy

## 2018-05-13 ENCOUNTER — Ambulatory Visit (INDEPENDENT_AMBULATORY_CARE_PROVIDER_SITE_OTHER): Payer: PPO | Admitting: Podiatry

## 2018-05-13 ENCOUNTER — Encounter: Payer: Self-pay | Admitting: Podiatry

## 2018-05-13 DIAGNOSIS — M2042 Other hammer toe(s) (acquired), left foot: Secondary | ICD-10-CM | POA: Diagnosis not present

## 2018-05-13 DIAGNOSIS — E1149 Type 2 diabetes mellitus with other diabetic neurological complication: Secondary | ICD-10-CM

## 2018-05-13 DIAGNOSIS — M79675 Pain in left toe(s): Secondary | ICD-10-CM | POA: Diagnosis not present

## 2018-05-13 DIAGNOSIS — M2041 Other hammer toe(s) (acquired), right foot: Secondary | ICD-10-CM

## 2018-05-13 DIAGNOSIS — B351 Tinea unguium: Secondary | ICD-10-CM

## 2018-05-13 DIAGNOSIS — Q828 Other specified congenital malformations of skin: Secondary | ICD-10-CM

## 2018-05-13 DIAGNOSIS — E119 Type 2 diabetes mellitus without complications: Secondary | ICD-10-CM | POA: Diagnosis not present

## 2018-05-13 DIAGNOSIS — Z899 Acquired absence of limb, unspecified: Secondary | ICD-10-CM

## 2018-05-13 NOTE — Progress Notes (Signed)
Subjective: 42 year old male presents the office today for concerns of elongated toenails to his left fourth and fifth toes that he was to have trimmed as they are causing irritation.  He also is requesting new diabetic shoes.  The last saw him he is undergone a below-knee amputation on the right side.  Denies any open sores.  He also the calluses left foot.  No other concerns. Denies any systemic complaints such as fevers, chills, nausea, vomiting. No acute changes since last appointment, and no other complaints at this time.   Objective: AAO x3, NAD DP, PT pulses decreased in the left side.  On the left fourth and fifth toes nails are significantly hypertrophic, dystrophic, elongated and they are curving putting pressure on the surrounding skin.  There is no drainage or pus or any signs of infection to the toenail sites.  Hyperkeratotic lesion left foot submetatarsal 5.  Toes 1 through 3 in the left foot are amputated.  Right below-knee amputation. No open lesions or pre-ulcerative lesions.  No pain with calf compression, swelling, warmth, erythema  Assessment: Left foot symptomatic onychomycosis, hyperkeratotic lesion with history of uncontrolled diabetes with a right below-knee amputation  Plan: -All treatment options discussed with the patient including all alternatives, risks, complications.  -Nails debrided x2 without any complications or bleeding on the left foot. -Hyperkeratotic lesion sharply debrided x1 without any complications or bleeding. -Discussed the importance of daily foot inspection. -Follow-up with Raiford Nobleick or Betha for diabetic shoes.  I completed paperwork today and [facility]. -Otherwise we will see him back in 3 months for routine care sooner if needed. -Patient encouraged to call the office with any questions, concerns, change in symptoms.   Vivi BarrackMatthew R Kaymarie Clark DPM

## 2018-05-19 ENCOUNTER — Ambulatory Visit: Payer: PPO | Admitting: Orthotics

## 2018-05-19 DIAGNOSIS — Z899 Acquired absence of limb, unspecified: Secondary | ICD-10-CM

## 2018-05-19 DIAGNOSIS — E119 Type 2 diabetes mellitus without complications: Secondary | ICD-10-CM

## 2018-05-19 DIAGNOSIS — M2042 Other hammer toe(s) (acquired), left foot: Secondary | ICD-10-CM

## 2018-05-19 DIAGNOSIS — M2041 Other hammer toe(s) (acquired), right foot: Secondary | ICD-10-CM

## 2018-05-19 DIAGNOSIS — E1149 Type 2 diabetes mellitus with other diabetic neurological complication: Secondary | ICD-10-CM

## 2018-05-19 DIAGNOSIS — Q828 Other specified congenital malformations of skin: Secondary | ICD-10-CM

## 2018-05-19 DIAGNOSIS — B351 Tinea unguium: Secondary | ICD-10-CM

## 2018-05-24 ENCOUNTER — Encounter (HOSPITAL_COMMUNITY): Payer: Self-pay

## 2018-05-24 ENCOUNTER — Other Ambulatory Visit: Payer: Self-pay

## 2018-05-24 ENCOUNTER — Emergency Department (HOSPITAL_COMMUNITY)
Admission: EM | Admit: 2018-05-24 | Discharge: 2018-05-25 | Disposition: A | Discharge status: Home / Self Care | Payer: PPO | Attending: Emergency Medicine | Admitting: Emergency Medicine

## 2018-05-24 DIAGNOSIS — Z87891 Personal history of nicotine dependence: Secondary | ICD-10-CM | POA: Insufficient documentation

## 2018-05-24 DIAGNOSIS — Z794 Long term (current) use of insulin: Secondary | ICD-10-CM | POA: Insufficient documentation

## 2018-05-24 DIAGNOSIS — R112 Nausea with vomiting, unspecified: Secondary | ICD-10-CM | POA: Diagnosis not present

## 2018-05-24 DIAGNOSIS — E1122 Type 2 diabetes mellitus with diabetic chronic kidney disease: Secondary | ICD-10-CM | POA: Diagnosis not present

## 2018-05-24 DIAGNOSIS — Z5321 Procedure and treatment not carried out due to patient leaving prior to being seen by health care provider: Secondary | ICD-10-CM | POA: Diagnosis not present

## 2018-05-24 DIAGNOSIS — I129 Hypertensive chronic kidney disease with stage 1 through stage 4 chronic kidney disease, or unspecified chronic kidney disease: Secondary | ICD-10-CM | POA: Insufficient documentation

## 2018-05-24 DIAGNOSIS — R1084 Generalized abdominal pain: Secondary | ICD-10-CM | POA: Diagnosis not present

## 2018-05-24 DIAGNOSIS — K920 Hematemesis: Secondary | ICD-10-CM | POA: Insufficient documentation

## 2018-05-24 DIAGNOSIS — Z79899 Other long term (current) drug therapy: Secondary | ICD-10-CM | POA: Diagnosis not present

## 2018-05-24 DIAGNOSIS — N183 Chronic kidney disease, stage 3 (moderate): Secondary | ICD-10-CM | POA: Diagnosis not present

## 2018-05-24 DIAGNOSIS — R1110 Vomiting, unspecified: Secondary | ICD-10-CM | POA: Diagnosis not present

## 2018-05-24 LAB — CBC
HCT: 42.5 % (ref 39.0–52.0)
HEMOGLOBIN: 13.3 g/dL (ref 13.0–17.0)
MCH: 28.5 pg (ref 26.0–34.0)
MCHC: 31.3 g/dL (ref 30.0–36.0)
MCV: 91.2 fL (ref 80.0–100.0)
NRBC: 0 % (ref 0.0–0.2)
Platelets: 259 10*3/uL (ref 150–400)
RBC: 4.66 MIL/uL (ref 4.22–5.81)
RDW: 13.9 % (ref 11.5–15.5)
WBC: 12.6 10*3/uL — AB (ref 4.0–10.5)

## 2018-05-24 LAB — CBG MONITORING, ED: GLUCOSE-CAPILLARY: 175 mg/dL — AB (ref 70–99)

## 2018-05-24 NOTE — ED Triage Notes (Signed)
Pt reports generalized abd pain with n/v. Pt noticed dark red blood in his emesis this morning. Pt states he has had some constipation but had a BM this morning.

## 2018-05-25 ENCOUNTER — Encounter (HOSPITAL_COMMUNITY): Payer: Self-pay | Admitting: Emergency Medicine

## 2018-05-25 ENCOUNTER — Emergency Department (HOSPITAL_COMMUNITY)
Admission: EM | Admit: 2018-05-25 | Discharge: 2018-05-25 | Disposition: A | Discharge status: Home / Self Care | Payer: PPO | Source: Home / Self Care | Attending: Emergency Medicine | Admitting: Emergency Medicine

## 2018-05-25 ENCOUNTER — Emergency Department (HOSPITAL_COMMUNITY): Payer: PPO

## 2018-05-25 DIAGNOSIS — R112 Nausea with vomiting, unspecified: Secondary | ICD-10-CM | POA: Diagnosis not present

## 2018-05-25 DIAGNOSIS — E1122 Type 2 diabetes mellitus with diabetic chronic kidney disease: Secondary | ICD-10-CM

## 2018-05-25 DIAGNOSIS — N183 Chronic kidney disease, stage 3 (moderate): Secondary | ICD-10-CM

## 2018-05-25 DIAGNOSIS — Z79899 Other long term (current) drug therapy: Secondary | ICD-10-CM | POA: Insufficient documentation

## 2018-05-25 DIAGNOSIS — R1084 Generalized abdominal pain: Secondary | ICD-10-CM

## 2018-05-25 DIAGNOSIS — I129 Hypertensive chronic kidney disease with stage 1 through stage 4 chronic kidney disease, or unspecified chronic kidney disease: Secondary | ICD-10-CM

## 2018-05-25 DIAGNOSIS — R1110 Vomiting, unspecified: Secondary | ICD-10-CM | POA: Diagnosis not present

## 2018-05-25 DIAGNOSIS — Z87891 Personal history of nicotine dependence: Secondary | ICD-10-CM

## 2018-05-25 DIAGNOSIS — Z794 Long term (current) use of insulin: Secondary | ICD-10-CM | POA: Insufficient documentation

## 2018-05-25 LAB — COMPREHENSIVE METABOLIC PANEL
ALBUMIN: 3.8 g/dL (ref 3.5–5.0)
ALT: 18 U/L (ref 0–44)
ALT: 19 U/L (ref 0–44)
ANION GAP: 12 (ref 5–15)
ANION GAP: 12 (ref 5–15)
AST: 16 U/L (ref 15–41)
AST: 18 U/L (ref 15–41)
Albumin: 3.9 g/dL (ref 3.5–5.0)
Alkaline Phosphatase: 87 U/L (ref 38–126)
Alkaline Phosphatase: 82 U/L (ref 38–126)
BILIRUBIN TOTAL: 0.7 mg/dL (ref 0.3–1.2)
BUN: 17 mg/dL (ref 6–20)
BUN: 19 mg/dL (ref 6–20)
CO2: 26 mmol/L (ref 22–32)
CO2: 26 mmol/L (ref 22–32)
Calcium: 9.6 mg/dL (ref 8.9–10.3)
Calcium: 9.7 mg/dL (ref 8.9–10.3)
Chloride: 100 mmol/L (ref 98–111)
Chloride: 99 mmol/L (ref 98–111)
Creatinine, Ser: 1.58 mg/dL — ABNORMAL HIGH (ref 0.61–1.24)
Creatinine, Ser: 1.73 mg/dL — ABNORMAL HIGH (ref 0.61–1.24)
GFR calc Af Amer: >60 mL/min (ref >60)
GFR calc Af Amer: 56 mL/min — ABNORMAL LOW (ref >60)
GFR calc non Af Amer: 48 mL/min — ABNORMAL LOW (ref >60)
GFR, EST NON AFRICAN AMERICAN: 54 mL/min — AB (ref >60)
GLUCOSE: 200 mg/dL — AB (ref 70–99)
Glucose, Bld: 151 mg/dL — ABNORMAL HIGH (ref 70–99)
POTASSIUM: 4 mmol/L (ref 3.5–5.1)
POTASSIUM: 3.8 mmol/L (ref 3.5–5.1)
Sodium: 138 mmol/L (ref 135–145)
Sodium: 137 mmol/L (ref 135–145)
Total Bilirubin: 0.9 mg/dL (ref 0.3–1.2)
Total Protein: 7.8 g/dL (ref 6.5–8.1)
Total Protein: 8 g/dL (ref 6.5–8.1)

## 2018-05-25 LAB — LIPASE, BLOOD
LIPASE: 28 U/L (ref 11–51)
Lipase: 30 U/L (ref 11–51)

## 2018-05-25 LAB — URINALYSIS, ROUTINE W REFLEX MICROSCOPIC
BILIRUBIN URINE: NEGATIVE
Bacteria, UA: NONE SEEN
Glucose, UA: NEGATIVE mg/dL
HGB URINE DIPSTICK: NEGATIVE
Ketones, ur: NEGATIVE mg/dL
LEUKOCYTES UA: NEGATIVE
Nitrite: NEGATIVE
Protein, ur: 100 mg/dL — AB
Specific Gravity, Urine: >1.046 — ABNORMAL HIGH (ref 1.005–1.030)
pH: 5 (ref 5.0–8.0)

## 2018-05-25 LAB — CBC WITH DIFFERENTIAL/PLATELET
Abs Immature Granulocytes: 0.05 10*3/uL (ref 0.00–0.07)
Basophils Absolute: 0 10*3/uL (ref 0.0–0.1)
Basophils Relative: 0 %
Eosinophils Absolute: 0.1 10*3/uL (ref 0.0–0.5)
Eosinophils Relative: 0 %
HCT: 42.2 % (ref 39.0–52.0)
Hemoglobin: 13.9 g/dL (ref 13.0–17.0)
Immature Granulocytes: 0 %
Lymphocytes Relative: 18 %
Lymphs Abs: 2.1 10*3/uL (ref 0.7–4.0)
MCH: 29.8 pg (ref 26.0–34.0)
MCHC: 32.9 g/dL (ref 30.0–36.0)
MCV: 90.4 fL (ref 80.0–100.0)
Monocytes Absolute: 0.7 10*3/uL (ref 0.1–1.0)
Monocytes Relative: 6 %
NEUTROS PCT: 76 %
Neutro Abs: 8.5 10*3/uL — ABNORMAL HIGH (ref 1.7–7.7)
Platelets: 243 10*3/uL (ref 150–400)
RBC: 4.67 MIL/uL (ref 4.22–5.81)
RDW: 14.1 % (ref 11.5–15.5)
WBC: 11.5 10*3/uL — ABNORMAL HIGH (ref 4.0–10.5)
nRBC: 0 % (ref 0.0–0.2)

## 2018-05-25 MED ORDER — ONDANSETRON 4 MG PO TBDP: 4.0000 mg | ORAL_TABLET | Freq: Three times a day (TID) | ORAL | 0 refills | Status: DC | PRN

## 2018-05-25 MED ORDER — ONDANSETRON HCL 4 MG/2ML IJ SOLN
4.0000 mg | Freq: Once | INTRAMUSCULAR | Status: AC
  Administered 2018-05-25: 4 mg via INTRAVENOUS
  Filled 2018-05-25: qty 2

## 2018-05-25 MED ORDER — SODIUM CHLORIDE 0.9 % IV SOLN
INTRAVENOUS | Status: DC
  Administered 2018-05-25: 17:00:00 via INTRAVENOUS

## 2018-05-25 MED ORDER — IOHEXOL 300 MG/ML  SOLN
100.0000 mL | Freq: Once | INTRAMUSCULAR | Status: AC | PRN
  Administered 2018-05-25: 100 mL via INTRAVENOUS

## 2018-05-25 MED ORDER — FENTANYL CITRATE (PF) 100 MCG/2ML IJ SOLN
50.0000 ug | Freq: Once | INTRAMUSCULAR | Status: AC
  Administered 2018-05-25: 50 ug via INTRAVENOUS
  Filled 2018-05-25: qty 2

## 2018-05-25 NOTE — ED Triage Notes (Signed)
Pt reports n/v and abd pain x3 days, denies diarrhea, reports some blood in emesis yesterday. States he was here but waited 7 hours and LWB. Pt a/ox4, resp e/u, nad.

## 2018-05-25 NOTE — ED Notes (Signed)
Patient had labs drawn last night during ed visit

## 2018-05-25 NOTE — ED Provider Notes (Signed)
Care assumed from previous provider Dr. Jeraldine LootsLockwood. Please see note for further details. Case discussed, plan agreed upon. Will follow up on pending CT. If reassuring, likely dc home.   CT scan reviewed with no acute findings.  Stable thickening of the distal esophagus.  On reevaluation, he feels improved.  No further emesis.  Tolerating p.o.  He feels comfortable with discharge to home.  He will follow-up with his primary care doctor.  Reasons to return to the ER were discussed and all questions were answered.   Ward, Chase PicketJaime Pilcher, PA-C 05/25/18 1948    Gwyneth SproutPlunkett, Whitney, MD 05/26/18 2014

## 2018-05-25 NOTE — ED Provider Notes (Signed)
[facility] EMERGENCY DEPARTMENT Provider Note   CSN: [id number] Arrival date & time: 05/25/18  1340     History   Chief Complaint Chief Complaint  Patient presents with  . Emesis  . Abdominal Pain    HPI [name] is a 42 y.o. male.  HPI Patient presents concern nausea, vomiting, abdominal pain. Patient has multiple medical issues including CKD, diabetes. Onset of this illness was about 3 days ago. Since onset symptoms been persistent, with nausea, vomiting, diffuse abdominal pain, anorexia. No ability to tolerate oral intake. He also complains of fever and chills, subjectively.  Past Medical History:  Diagnosis Date  . Acquired contracture of Achilles tendon, right   . Acute osteomyelitis, ankle and foot 07/30/2010   Qualifier: Diagnosis of  By: Tommy Medal MD, Roderic Scarce    . Anemia   . Chronic kidney disease (CKD), stage III (moderate) (HCC)   . Chronic osteomyelitis of right foot ([place])   . Collagen vascular disease ([place])   . DDD (degenerative disc disease), lumbar   . Dehiscence of amputation stump (HCC)     dehiscence right transmetetarsal amputation achilles contracture  . Diabetic foot ulcer ([place]) 05/14/2017  . Diabetic foot ulcer with osteomyelitis ([place]) 05/12/2013  . Gastroparesis   . GERD (gastroesophageal reflux disease)   . Headache   . Hiatal hernia   . Hyperlipidemia   . Hypertension   . Pancreatitis   . Peripheral vascular disease ([place])   . Polysubstance abuse ([place]) 03/08/2016  . Renal insufficiency   . Status post transmetatarsal amputation of foot, right ([place]) 07/10/2016  . Type II diabetes mellitus ([place]) dx'd ~ 1996  . Vascular disease    poor circulation to left foot    Patient Active Problem List   Diagnosis Date Noted  . Hyperlipidemia 04/01/2018  . Hypertension 04/01/2018  . Chronic anemia   . Diabetes mellitus type 2 with complications, uncontrolled ([place])   . Diabetic peripheral neuropathy ([place])   . PVD (peripheral  vascular disease) ([place])   . Hypokalemia   . Acute blood loss anemia   . Post-operative pain   . Unilateral complete BKA, right, subsequent encounter ([place])   . Diabetic polyneuropathy associated with type 2 diabetes mellitus ([place]) 07/10/2016  . Polysubstance abuse ([place]) 03/08/2016  . Tobacco abuse 02/27/2016  . Gastroparesis 05/28/2015  . GERD (gastroesophageal reflux disease) 10/01/2014  . Esophageal reflux   . Fever 09/27/2014  . Abdominal pain, lower 09/27/2014  . Abnormal ECG 05/30/2014  . Chest pain 05/30/2014  . Ankle pain 05/30/2014  . Pain in the chest   . Nausea 08/30/2013  . Epigastric abdominal pain 08/30/2013  . Low grade fever 08/30/2013  . Diabetic osteomyelitis b/l toes 03/23/2013  . DM (diabetes mellitus) type II uncontrolled, periph vascular disorder ([place]) 03/23/2013  . CKD (chronic kidney disease), stage III ([place]) 03/23/2013  . Anemia 03/23/2013  . Benign essential HTN 03/23/2013  . AKI (acute kidney injury) ([place]) 03/22/2013  . Viral gastroenteritis 09/17/2012  . Nausea & vomiting 09/16/2012  . Acute pancreatitis 09/16/2012  . Diabetes mellitus ([place]) 09/20/2010  . Onychomycosis 09/20/2010  . METHICILLIN SUSCEPTIBLE STAPH AUREUS SEPTICEMIA 07/30/2010    Past Surgical History:  Procedure Laterality Date  . AMPUTATION  04/25/2011   Procedure: AMPUTATION DIGIT;  Surgeon: Newt Minion, MD;  Location: [facility];  Service: Orthopedics;  Laterality: Left;  Left foot 3rd toe amputation MTP joint, Gastroc Recession  Achilles Lengthening   . AMPUTATION Bilateral 03/25/2013  Procedure: AMPUTATION RAY;  Surgeon: Newt Minion, MD;  Location: [facility];  Service: Orthopedics;  Laterality: Bilateral;  Left Great Toe Amputation at  MTP Joint, Right 1st and 2nd Ray Amputation   . AMPUTATION Right 10/03/2014   Procedure: AMPUTATION MIDFOOT;  Surgeon: Newt Minion, MD;  Location: [facility];  Service: Orthopedics;  Laterality: Right;  . AMPUTATION Right 07/14/2017   Procedure: RIGHT BELOW  KNEE AMPUTATION;  Surgeon: Newt Minion, MD;  Location: [facility];  Service: Orthopedics;  Laterality: Right;  . LAPAROSCOPIC CHOLECYSTECTOMY    . STUMP REVISION Right 05/23/2016   Procedure: Revision Right Transmetatarsal Amputation, Right Gastrocnemius Recession;  Surgeon: Newt Minion, MD;  Location: [facility];  Service: Orthopedics;  Laterality: Right;  . STUMP REVISION Right 05/15/2017   Procedure: REVISION RIGHT TRANSMETATARSAL AMPUTATION;  Surgeon: Newt Minion, MD;  Location: [facility];  Service: Orthopedics;  Laterality: Right;  . TOE AMPUTATION  2012   left foot; great toe and second toe        Home Medications    Prior to Admission medications   Medication Sig Start Date End Date Taking? Authorizing Provider  amLODipine (NORVASC) 10 MG tablet Take 10 mg by mouth daily. 04/07/18  Yes [provider]  atorvastatin (LIPITOR) 10 MG tablet Take 10 mg by mouth daily. 03/29/17  Yes [provider]  famotidine (PEPCID) 20 MG tablet Take 1 tablet (20 mg total) by mouth 2 (two) times daily. Patient taking differently: Take 20 mg by mouth 2 (two) times daily as needed for heartburn or indigestion.  04/12/18  Yes Robinson, [place] N, PA-C  hydroxypropyl methylcellulose / hypromellose (ISOPTO TEARS / GONIOVISC) 2.5 % ophthalmic solution Place 1 drop into both eyes as needed for dry eyes.   Yes [provider]  insulin aspart (NOVOLOG) 100 UNIT/ML injection Inject 8 Units into the skin 3 (three) times daily with meals. Patient taking differently: Inject 3 Units into the skin 3 (three) times daily with meals.  07/17/17  Yes Nita Sells, MD  insulin glargine (LANTUS) 100 UNIT/ML injection Inject 0.05 mLs (5 Units total) into the skin daily. Patient taking differently: Inject 8 Units into the skin daily.  07/17/17  Yes Nita Sells, MD  omeprazole (PRILOSEC OTC) 20 MG tablet Take 20 mg by mouth daily as needed (indigestion).    Yes [provider]    ondansetron (ZOFRAN ODT) 4 MG disintegrating tablet Take 1 tablet (4 mg total) by mouth every 8 (eight) hours as needed for nausea or vomiting. 04/12/18  Yes Robinson, [place] N, PA-C  amLODipine (NORVASC) 5 MG tablet Take 1 tablet (5 mg total) by mouth daily. Patient not taking: Reported on 04/12/2018 07/17/17   Nita Sells, MD  blood glucose meter kit and supplies KIT Dispense based on patient and insurance preference. Use up to four times daily as directed. (FOR ICD-9 250.00, 250.01). 05/16/17   Rai, Ripudeep Raliegh Ip, MD  Insulin Syringes, Disposable, U-100 0.5 ML MISC Use with lantus and novolog vials. 05/16/17   Rai, Ripudeep K, MD  sucralfate (CARAFATE) 1 g tablet Take 1 tablet (1 g total) by mouth 4 (four) times daily -  with meals and at bedtime. Patient not taking: Reported on 05/25/2018 04/12/18   Robinson, [place] N, PA-C  pantoprazole (PROTONIX) 40 MG tablet Take 1 tablet (40 mg total) by mouth 2 (two) times daily. Patient not taking: Reported on 10/01/2014 09/30/14 05/26/15  Barton Dubois, MD    Family History Family History  Problem Relation Age of Onset  . Heart attack Father 74  . Hypertension Sister     Social History Social History   Tobacco Use  . Smoking status: Former Smoker    Packs/day: 0.10    Years: 4.00    Pack years: 0.40    Types: Cigarettes    Last attempt to quit: 06/10/2015    Years since quitting: 2.9  . Smokeless tobacco: Never Used  Substance Use Topics  . Alcohol use: No  . Drug use: Yes    Frequency: 10.0 times per week    Types: Marijuana     Allergies   Patient has no known allergies.   Review of Systems Review of Systems  Constitutional:       Per HPI, otherwise negative  HENT:       Per HPI, otherwise negative  Respiratory:       Per HPI, otherwise negative  Cardiovascular:       Per HPI, otherwise negative  Gastrointestinal: Positive for abdominal pain, nausea and vomiting.  Endocrine:       Negative aside from HPI   Genitourinary:       Neg aside from HPI   Musculoskeletal:       Per HPI, otherwise negative  Skin: Negative.   Neurological: Negative for syncope.     Physical Exam Updated Vital Signs BP (!) 163/102 (BP Location: Left Arm)   Pulse 86   Temp 98.8 F (37.1 C) (Oral)   Resp 14   SpO2 99%   Physical Exam Vitals signs and nursing note reviewed.  Constitutional:      General: He is not in acute distress.    Appearance: He is well-developed.  HENT:     Head: Normocephalic and atraumatic.  Eyes:     Conjunctiva/sclera: Conjunctivae normal.  Cardiovascular:     Rate and Rhythm: Normal rate and regular rhythm.  Pulmonary:     Effort: Pulmonary effort is normal. No respiratory distress.     Breath sounds: No stridor.  Abdominal:     General: There is no distension.     Tenderness: There is generalized abdominal tenderness. There is guarding.  Skin:    General: Skin is warm and dry.  Neurological:     Mental Status: He is alert and oriented to person, place, and time.      ED Treatments / Results  Labs (all labs ordered are listed, but only abnormal results are displayed) Labs Reviewed  COMPREHENSIVE METABOLIC PANEL - Abnormal; Notable for the following components:      Result Value   Glucose, Bld 151 (*)    Creatinine, Ser 1.73 (*)    GFR calc non Af Amer 48 (*)    GFR calc Af Amer 56 (*)    All other components within normal limits  CBC WITH DIFFERENTIAL/PLATELET - Abnormal; Notable for the following components:   WBC 11.5 (*)    Neutro Abs 8.5 (*)    All other components within normal limits  LIPASE, BLOOD  URINALYSIS, ROUTINE W REFLEX MICROSCOPIC    EKG None  Radiology No results found.  Procedures Procedures (including critical care time)  Medications Ordered in ED Medications  0.9 %  sodium chloride infusion ( Intravenous New Bag/Given 05/25/18 1640)  fentaNYL (SUBLIMAZE) injection 50 mcg (50 mcg Intravenous Given 05/25/18 1640)  ondansetron  (ZOFRAN) injection 4 mg (4 mg Intravenous Given 05/25/18 1640)  ondansetron (ZOFRAN) injection 4 mg (4 mg Intravenous Given 05/25/18 1845)  fentaNYL (  SUBLIMAZE) injection 50 mcg (50 mcg Intravenous Given 05/25/18 1845)  iohexol (OMNIPAQUE) 300 MG/ML solution 100 mL (100 mLs Intravenous Contrast Given 05/25/18 1852)     Initial Impression / Assessment and Plan / ED Course  I have reviewed the triage vital signs and the nursing notes.  Pertinent labs & imaging results that were available during my care of the patient were reviewed by me and considered in my medical decision making (see chart for details).     7:13 PM Initial labs notable for mild leukocytosis Patient continues to have pain, nausea. Patient was found to have elevated creatinine, consistent with prior studies. Patient is receiving fluids, fentanyl, Zofran. Patient has not yet gone for CT scan, these results will be followed, and the patient will be reassessed by PA Ward.  This young male with multiple medical issues including chronic kidney disease, diabetes presents with nausea, vomiting, belly pain. Patient is awake alert, with diffusely tender abdomen Patient is afebrile, but given the severity of symptoms, some suspicion for enteritis versus diverticulitis or other acute abdominal processes. Patient required additional evaluation, management, and on signout, CT scan was pending.  Final Clinical Impressions(s) / ED Diagnoses  Abdominal pain Nausea and vomiting   Carmin Muskrat, MD 05/25/18 1914

## 2018-05-25 NOTE — ED Notes (Signed)
Pt stable, ambulatory, states understanding of discharge instructions 

## 2018-05-25 NOTE — ED Notes (Signed)
Called Pt for vitals . Pt refused vitals.

## 2018-05-25 NOTE — Discharge Instructions (Signed)
It was my pleasure taking care of you today!   Zofran as needed for nausea.  Call your primary care doctor tomorrow to schedule a follow-up appointment.  Return to the Emergency Department if you develop any of the following symptoms:  You have a fever.  You keep throwing up and can't keep fluids down.  You pass bloody or black tarry stools.  There is bright red blood in the stool. You are getting worse You have any questions or concerns.

## 2018-05-25 NOTE — ED Notes (Signed)
Pt left and was seen getting in a car and driving away.

## 2018-05-25 NOTE — ED Notes (Signed)
Patient transported to CT 

## 2018-06-09 NOTE — Progress Notes (Signed)

## 2018-06-15 DIAGNOSIS — E161 Other hypoglycemia: Secondary | ICD-10-CM | POA: Diagnosis not present

## 2018-06-15 DIAGNOSIS — E162 Hypoglycemia, unspecified: Secondary | ICD-10-CM | POA: Diagnosis not present

## 2018-07-05 ENCOUNTER — Telehealth (INDEPENDENT_AMBULATORY_CARE_PROVIDER_SITE_OTHER): Payer: Self-pay | Admitting: Orthopedic Surgery

## 2018-07-05 NOTE — Telephone Encounter (Signed)
Patient called needing a Rx for a socket replacement for his prosthetic leg. Patient asked if the Rx can be sent to [facility]? The number to contact patient is [phone]

## 2018-07-06 ENCOUNTER — Other Ambulatory Visit (INDEPENDENT_AMBULATORY_CARE_PROVIDER_SITE_OTHER): Payer: Self-pay

## 2018-07-06 NOTE — Telephone Encounter (Signed)
Done will call patient.

## 2018-07-16 DIAGNOSIS — E785 Hyperlipidemia, unspecified: Secondary | ICD-10-CM | POA: Diagnosis not present

## 2018-07-16 DIAGNOSIS — I10 Essential (primary) hypertension: Secondary | ICD-10-CM | POA: Diagnosis not present

## 2018-07-16 DIAGNOSIS — E118 Type 2 diabetes mellitus with unspecified complications: Secondary | ICD-10-CM | POA: Diagnosis not present

## 2018-07-26 ENCOUNTER — Ambulatory Visit (INDEPENDENT_AMBULATORY_CARE_PROVIDER_SITE_OTHER): Payer: PPO | Admitting: Physician Assistant

## 2018-07-26 ENCOUNTER — Encounter (INDEPENDENT_AMBULATORY_CARE_PROVIDER_SITE_OTHER): Payer: Self-pay | Admitting: Physician Assistant

## 2018-07-26 VITALS — Ht 73.0 in | Wt 230.0 lb

## 2018-07-26 DIAGNOSIS — E1142 Type 2 diabetes mellitus with diabetic polyneuropathy: Secondary | ICD-10-CM | POA: Diagnosis not present

## 2018-07-26 DIAGNOSIS — Z89512 Acquired absence of left leg below knee: Secondary | ICD-10-CM

## 2018-07-26 NOTE — Progress Notes (Signed)
Office Visit Note   Patient: [name]           Date of Birth: 10/30/1975           MRN: [id number] Visit Date: 07/26/2018              Requested by: Georgianne Fick, MD [address] [place], [place] [address] PCP: Georgianne Fick, MD  Chief Complaint  Patient presents with  . Right Leg - Follow-up    Sock replacement for prosthetic      HPI: The patient is a 43 year old gentleman who is seen for follow-up of his right transtibial amputation.  He reports that he has had further consolidation and atrophy of the residual limb and is having to wear multiple ply socks, 8 socks and total to try to prevent rotation of the prosthetic and movement of the prosthetic.  He is continuing to have the symptoms despite multiple socks.  He would like to be fitted with a new socket.  [facility]  Assessment & Plan: Visit Diagnoses:  1. Acquired absence of left lower extremity below knee (HCC)   2. Diabetic polyneuropathy associated with type 2 diabetes mellitus (HCC)     Plan: The patient was given a prescription for [facility] for a K3 socket change with materials and supplies.  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  He requires the ability to change speeds while walking in public places and also to walk on uneven surfaces such as grass, gravel, curbs, ramps, stairs and bleachers.  Follow-Up Instructions: Return if symptoms worsen or fail to improve.   Ortho Exam  Patient is alert, oriented, no adenopathy, well-dressed, normal affect, normal respiratory effort. The right transtibial amputation shows good consolidation.  He is wearing multiple ply with as noted and has no current skin breakdown or pressure areas but does have a rotating and movements in the socket despite his multiple socks with  ambulation.  He has full knee extension and good flexion.  There is no signs of cellulitis or infection otherwise.  Imaging: No results found. No images are attached to the encounter.  Labs: Lab Results  Component Value Date   HGBA1C 10.7 (H) 07/12/2017   HGBA1C 14.2 (H) 05/13/2017   HGBA1C 6.4 (H) 05/28/2015   ESRSEDRATE 57 (H) 07/12/2017   ESRSEDRATE 51 (H) 05/13/2017   ESRSEDRATE 53 (H) 07/04/2016   CRP 0.9 07/12/2017   CRP 2.3 (H) 05/13/2017   CRP 0.9 07/04/2016   REPTSTATUS 06/03/2017 FINAL 05/29/2017   GRAMSTAIN  07/22/2010    FEW WBC PRESENT,BOTH PMN AND MONONUCLEAR RARE SQUAMOUS EPITHELIAL CELLS PRESENT RARE GRAM POSITIVE COCCI IN PAIRS   GRAMSTAIN  07/22/2010    FEW WBC PRESENT,BOTH PMN AND MONONUCLEAR RARE SQUAMOUS EPITHELIAL CELLS PRESENT RARE GRAM POSITIVE COCCI IN PAIRS   CULT  05/29/2017    NO GROWTH 5 DAYS Performed at [facility], [address], [place], [place] [address]    [facility] STAPHYLOCOCCUS AUREUS 07/22/2010     Lab Results  Component Value Date   ALBUMIN 3.9 05/25/2018   ALBUMIN 3.8 05/24/2018   ALBUMIN 3.9 04/12/2018   PREALBUMIN 16.4 (L) 05/13/2017   PREALBUMIN 22.5 02/27/2016    Body mass index is 30.34 kg/m.  Orders:  No orders of the defined types were placed in this encounter.  No orders of the defined types were placed in this encounter.    Procedures: No procedures performed  Clinical Data: No additional findings.  ROS:  All other systems negative, except as noted in the HPI. Review of Systems  Objective: Vital Signs: Ht 6\' 1"  (1.854 m)   Wt 230 lb (104.3 kg)   BMI 30.34 kg/m   Specialty Comments:  No specialty comments available.  PMFS History: Patient Active Problem List   Diagnosis Date Noted  . Hyperlipidemia 04/01/2018  . Hypertension 04/01/2018  . Chronic anemia   . Diabetes mellitus type 2 with complications, uncontrolled (HCC)   . Diabetic peripheral neuropathy (HCC)   . PVD (peripheral  vascular disease) (HCC)   . Hypokalemia   . Acute blood loss anemia   . Post-operative pain   . Unilateral complete BKA, right, subsequent encounter (HCC)   . Diabetic polyneuropathy associated with type 2 diabetes mellitus (HCC) 07/10/2016  . Polysubstance abuse (HCC) 03/08/2016  . Tobacco abuse 02/27/2016  . Gastroparesis 05/28/2015  . GERD (gastroesophageal reflux disease) 10/01/2014  . Esophageal reflux   . Fever 09/27/2014  . Abdominal pain, lower 09/27/2014  . Abnormal ECG 05/30/2014  . Chest pain 05/30/2014  . Ankle pain 05/30/2014  . Pain in the chest   . Nausea 08/30/2013  . Epigastric abdominal pain 08/30/2013  . Low grade fever 08/30/2013  . Diabetic osteomyelitis b/l toes 03/23/2013  . DM (diabetes mellitus) type II uncontrolled, periph vascular disorder (HCC) 03/23/2013  . CKD (chronic kidney disease), stage III (HCC) 03/23/2013  . Anemia 03/23/2013  . Benign essential HTN 03/23/2013  . AKI (acute kidney injury) (HCC) 03/22/2013  . Viral gastroenteritis 09/17/2012  . Nausea & vomiting 09/16/2012  . Acute pancreatitis 09/16/2012  . Diabetes mellitus (HCC) 09/20/2010  . Onychomycosis 09/20/2010  . METHICILLIN SUSCEPTIBLE STAPH AUREUS SEPTICEMIA 07/30/2010   Past Medical History:  Diagnosis Date  . Acquired contracture of Achilles tendon, right   . Acute osteomyelitis, ankle and foot 07/30/2010   Qualifier: Diagnosis of  By: Daiva EvesVan Dam MD, Remi Haggardornelius    . Anemia   . Chronic kidney disease (CKD), stage III (moderate) (HCC)   . Chronic osteomyelitis of right foot (HCC)   . Collagen vascular disease (HCC)   . DDD (degenerative disc disease), lumbar   . Dehiscence of amputation stump (HCC)     dehiscence right transmetetarsal amputation achilles contracture  . Diabetic foot ulcer (HCC) 05/14/2017  . Diabetic foot ulcer with osteomyelitis (HCC) 05/12/2013  . Gastroparesis   . GERD (gastroesophageal reflux disease)   . Headache   . Hiatal hernia   . Hyperlipidemia     . Hypertension   . Pancreatitis   . Peripheral vascular disease (HCC)   . Polysubstance abuse (HCC) 03/08/2016  . Renal insufficiency   . Status post transmetatarsal amputation of foot, right (HCC) 07/10/2016  . Type II diabetes mellitus (HCC) dx'd ~ 1996  . Vascular disease    poor circulation to left foot    Family History  Problem Relation Age of Onset  . Heart attack Father 6152  . Hypertension Sister     Past Surgical History:  Procedure Laterality Date  . AMPUTATION  04/25/2011   Procedure: AMPUTATION DIGIT;  Surgeon: Nadara MustardMarcus V Duda, MD;  [facility];  Service: Orthopedics;  Laterality: Left;  Left foot 3rd toe amputation MTP joint, Gastroc Recession  Achilles Lengthening   . AMPUTATION Bilateral 03/25/2013   Procedure: AMPUTATION RAY;  Surgeon: Nadara MustardMarcus V Duda, MD;  [facility];  Service: Orthopedics;  Laterality: Bilateral;  Left Great Toe Amputation at  MTP Joint, Right 1st and 2nd Ray Amputation   . AMPUTATION Right 10/03/2014   Procedure: AMPUTATION MIDFOOT;  Surgeon: Nadara Mustard, MD;  Location: [facility] OR;  Service: Orthopedics;  Laterality: Right;  . AMPUTATION Right 07/14/2017   Procedure: RIGHT BELOW KNEE AMPUTATION;  Surgeon: Nadara Mustard, MD;  [facility] OR;  Service: Orthopedics;  Laterality: Right;  . LAPAROSCOPIC CHOLECYSTECTOMY    . STUMP REVISION Right 05/23/2016   Procedure: Revision Right Transmetatarsal Amputation, Right Gastrocnemius Recession;  Surgeon: Nadara Mustard, MD;  [facility] OR;  Service: Orthopedics;  Laterality: Right;  . STUMP REVISION Right 05/15/2017   Procedure: REVISION RIGHT TRANSMETATARSAL AMPUTATION;  Surgeon: Nadara Mustard, MD;  Location: [facility] OR;  Service: Orthopedics;  Laterality: Right;  . TOE AMPUTATION  2012   left foot; great toe and second toe   Social History   Occupational History  . Not on file  Tobacco Use  . Smoking status: Former Smoker    Packs/day: 0.10    Years: 4.00    Pack years: 0.40    Types: Cigarettes     Last attempt to quit: 06/10/2015    Years since quitting: 3.1  . Smokeless tobacco: Never Used  Substance and Sexual Activity  . Alcohol use: No  . Drug use: Yes    Frequency: 10.0 times per week    Types: Marijuana  . Sexual activity: Yes    Birth control/protection: None

## 2018-07-27 ENCOUNTER — Encounter (INDEPENDENT_AMBULATORY_CARE_PROVIDER_SITE_OTHER): Payer: Self-pay | Admitting: Physician Assistant

## 2018-08-06 ENCOUNTER — Encounter: Payer: Self-pay | Admitting: Rehabilitation

## 2018-08-06 NOTE — Therapy (Signed)
[facility] [address] [place], [place], [address] Phone: [phone]   Fax:  [phone]  Patient Details  Name: [name] MRN: [id number] Date of Birth: 10/12/1975 Referring Provider:  No ref. provider found  Encounter Date: 08/06/2018   PHYSICAL THERAPY DISCHARGE SUMMARY  Visits from Start of Care: 2  Current functional level related to goals / functional outcomes: Unsure as pt did not return for follow up visits.    Remaining deficits: PT Long Term Goals - 02/10/18 2018      PT LONG TERM GOAL #1   Title  Patient verbalizes & demonstrates understanding of proper prosthetic care to enable safe use of prosthesis. (All LTGs Target Date: 05/11/2018)    Time  3    Period  Months    Status  New    Target Date  05/11/18      PT LONG TERM GOAL #2   Title  Patient tolerates prosthesis wear >90% of awake hours without skin issues or limb pain to enable function throughout his day.     Time  3    Period  Months    Status  New    Target Date  05/11/18      PT LONG TERM GOAL #3   Title  Berg Balance >45/56 to lower fall risk.     Time  3    Period  Months    Status  New    Target Date  05/11/18      PT LONG TERM GOAL #4   Title  Patient ambulates 1000' outdoors including grass, ramps & curbs with prosthesis & cane or less modified independent for community mobility.     Time  3    Period  Months    Status  New    Target Date  05/11/18      PT LONG TERM GOAL #5   Title  Patient able to lift & carry 25# box and push/pull with prosthesis to enable to perform yard work.     Time  3    Period  Months    Status  New    Target Date  05/11/18         Education / Equipment: HEP   Plan: Patient agrees to discharge.  Patient goals were not met. Patient is being discharged due to not returning since the last visit.  ?????     Cameron Sprang, PT, MPT [facility] [address] [place], [place], [address] Phone: [phone]   Fax:  [phone] 08/06/18, 10:45 AM

## 2018-10-29 DIAGNOSIS — Z89511 Acquired absence of right leg below knee: Secondary | ICD-10-CM | POA: Diagnosis not present

## 2019-01-07 DIAGNOSIS — E118 Type 2 diabetes mellitus with unspecified complications: Secondary | ICD-10-CM | POA: Diagnosis not present

## 2019-01-13 DIAGNOSIS — Z0000 Encounter for general adult medical examination without abnormal findings: Secondary | ICD-10-CM | POA: Diagnosis not present

## 2019-01-13 DIAGNOSIS — I10 Essential (primary) hypertension: Secondary | ICD-10-CM | POA: Diagnosis not present

## 2019-01-13 DIAGNOSIS — I739 Peripheral vascular disease, unspecified: Secondary | ICD-10-CM | POA: Diagnosis not present

## 2019-01-13 DIAGNOSIS — E118 Type 2 diabetes mellitus with unspecified complications: Secondary | ICD-10-CM | POA: Diagnosis not present

## 2019-01-13 DIAGNOSIS — N183 Chronic kidney disease, stage 3 (moderate): Secondary | ICD-10-CM | POA: Diagnosis not present

## 2019-01-13 DIAGNOSIS — Z7189 Other specified counseling: Secondary | ICD-10-CM | POA: Diagnosis not present

## 2019-02-10 IMAGING — CT CT ABD-PELV W/ CM
2 of 5 series · 17 of 46 positions shown, 19 images · IV contrast (APPLIED)
Comparison: 04/12/2018

CLINICAL DATA: Nausea and vomiting for several days

EXAM:
CT ABDOMEN AND PELVIS WITH CONTRAST
TECHNIQUE: Multidetector CT imaging of the abdomen and pelvis was performed
using the standard protocol following bolus administration of
intravenous contrast.
CONTRAST:  100mL OMNIPAQUE IOHEXOL 300 MG/ML  SOLN

[Series 3: abd/ pelvis 5.0 i30f 2 · axial · 0.98mm/px · z∈[-631,-156]mm · 14 of 107 slices shown, 16 images]
[im 6/107  soft-tissue]
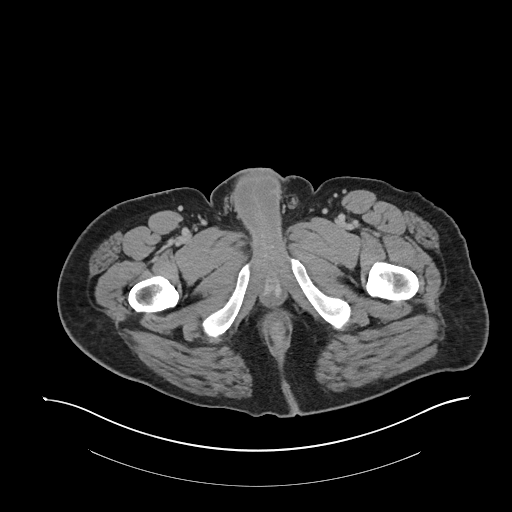
[im 6/107  bone]
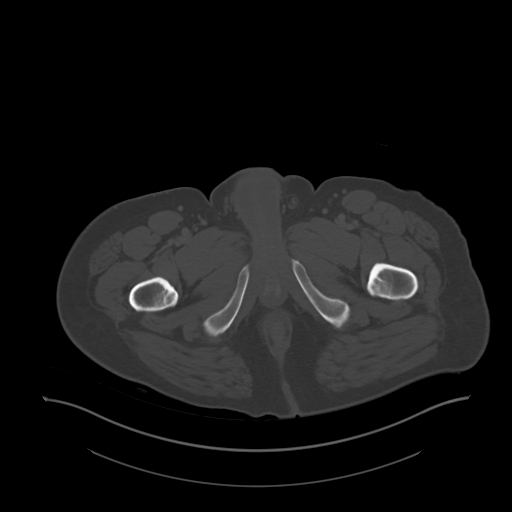
[im 12/107  soft-tissue]
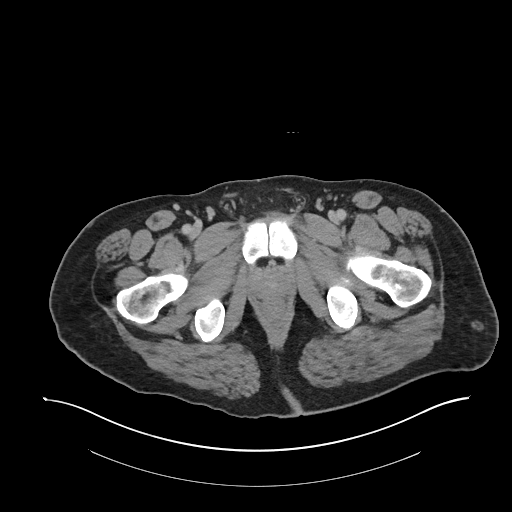
[im 24/107  soft-tissue]
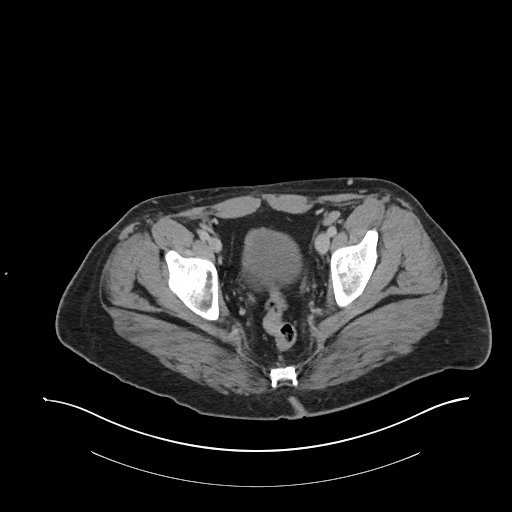
[im 30/107  soft-tissue]
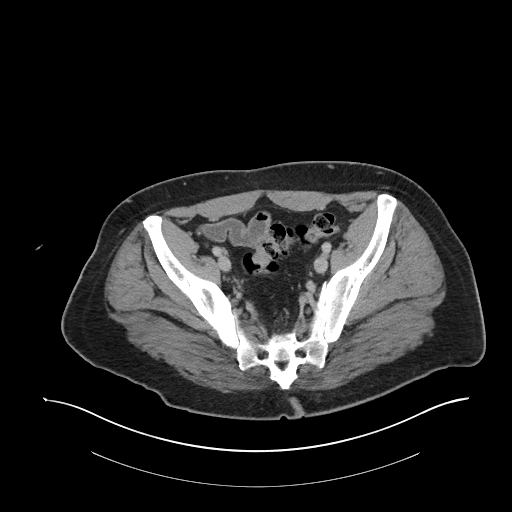
[im 36/107  soft-tissue]
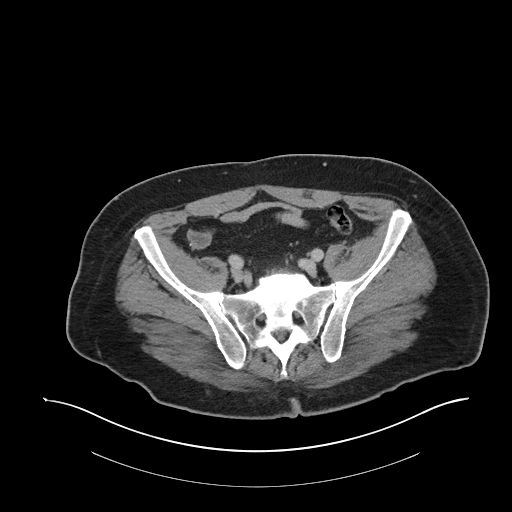
[im 42/107  soft-tissue]
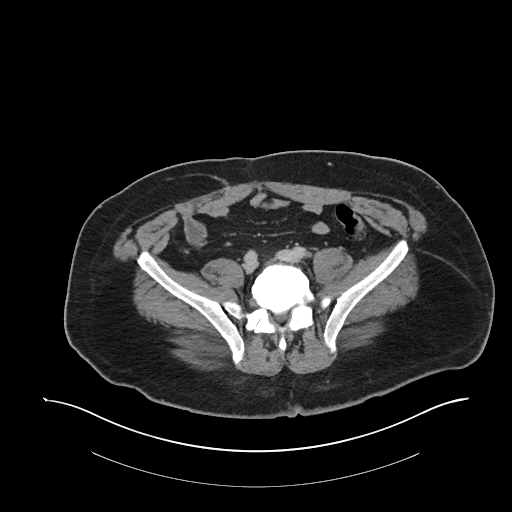
[im 48/107  soft-tissue]
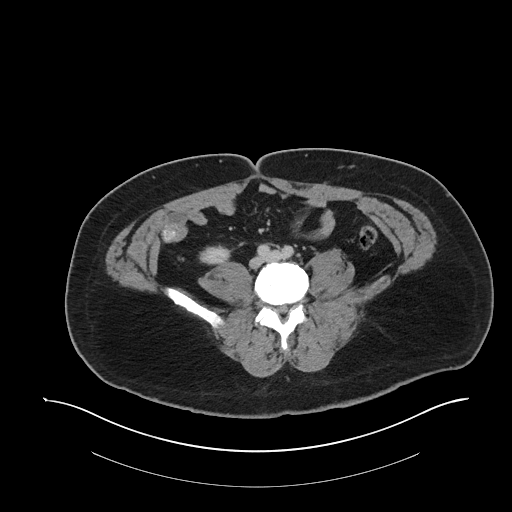
[im 59/107  soft-tissue]
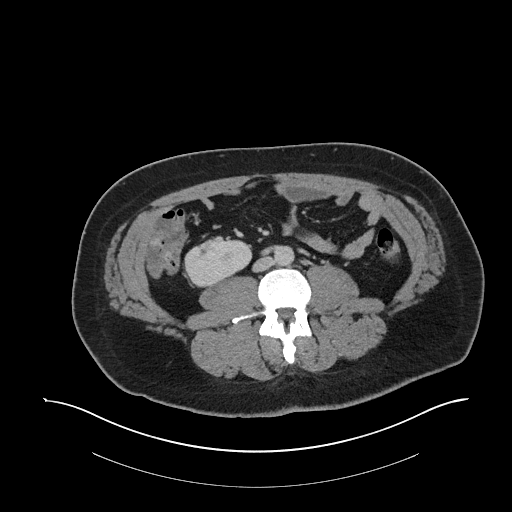
[im 65/107  soft-tissue]
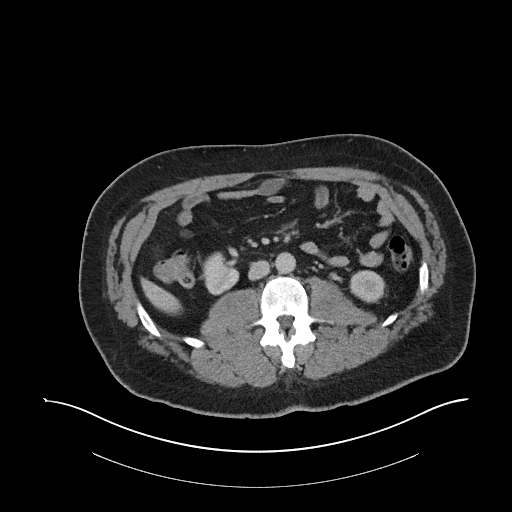
[im 65/107  bone]
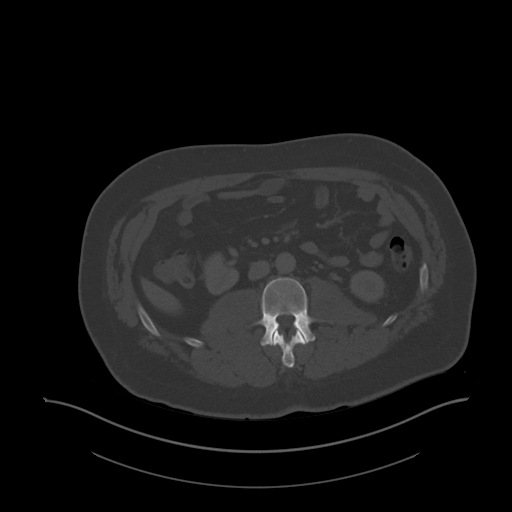
[im 71/107  soft-tissue]
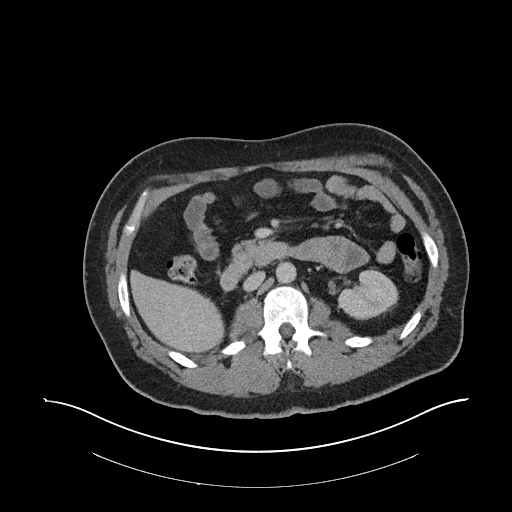
[im 77/107  soft-tissue]
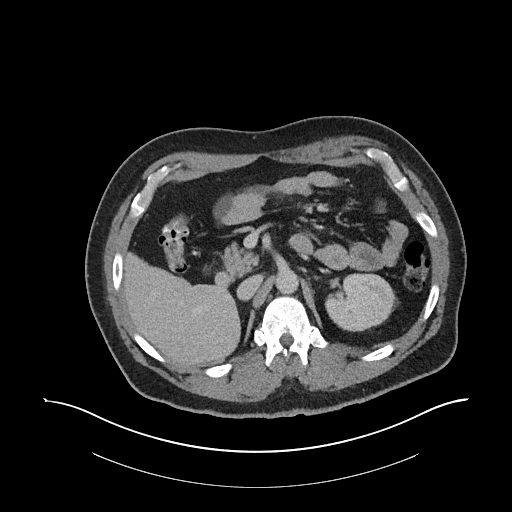
[im 83/107  soft-tissue]
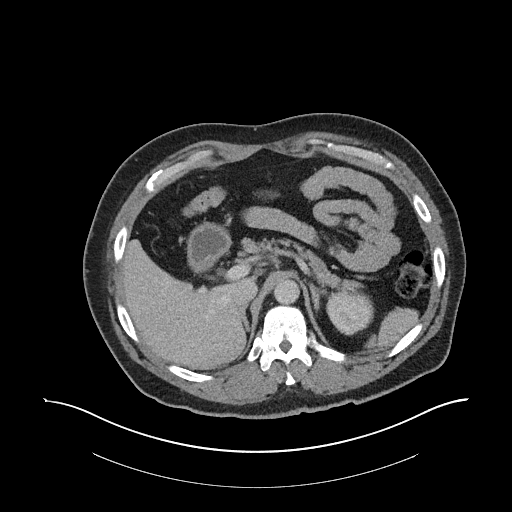
[im 95/107  soft-tissue]
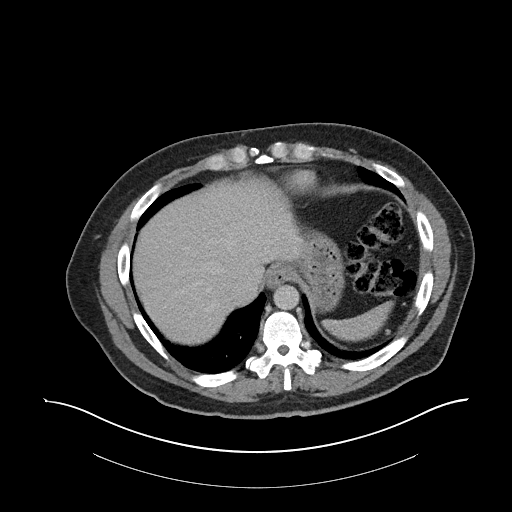
[im 101/107  soft-tissue]
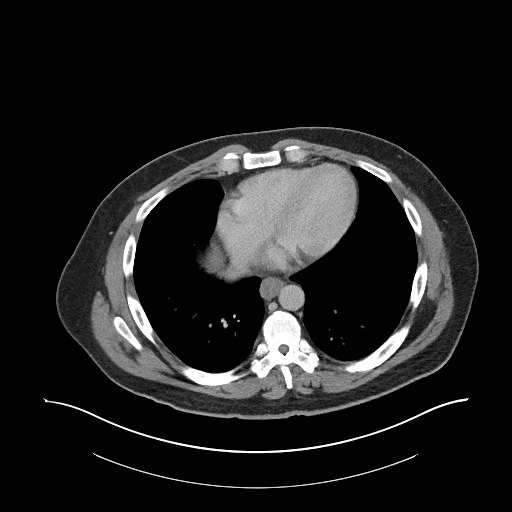

[Series 6: coronal soft tissue · coronal · 0.97mm/px · 3 of 100 slices shown]
[im 34/100  soft-tissue]
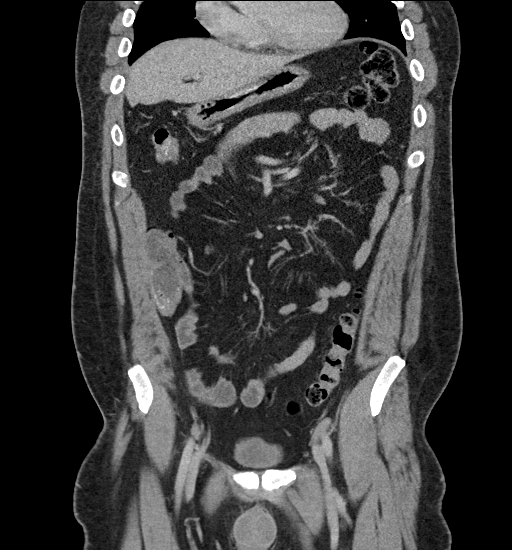
[im 45/100  soft-tissue]
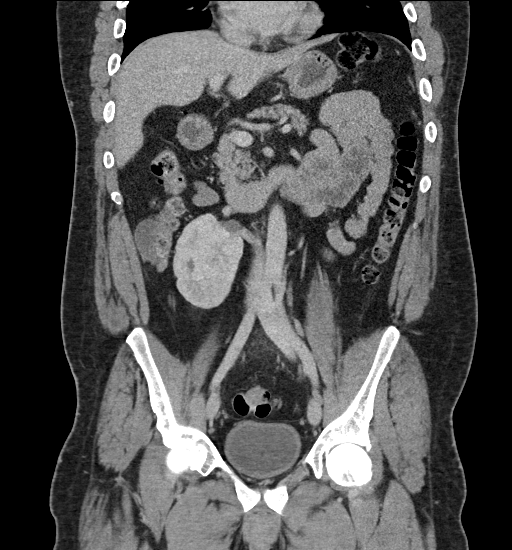
[im 56/100  soft-tissue]
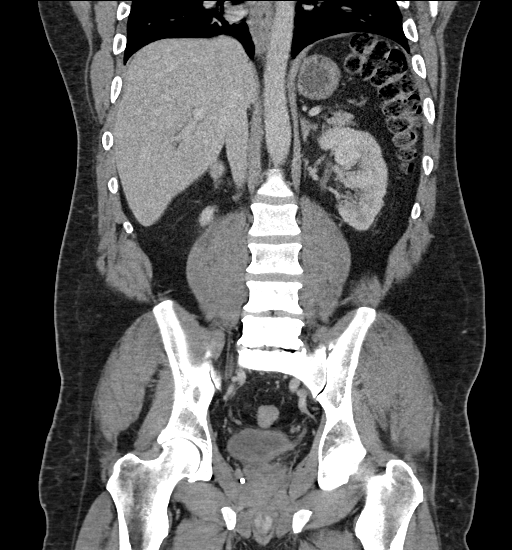

[17 of 46 positions shown; findings below may reference images not displayed]

FINDINGS: Lower chest: No acute abnormality.

Hepatobiliary: No focal liver abnormality is seen. Status post
cholecystectomy. No biliary dilatation.

Pancreas: Unremarkable. No pancreatic ductal dilatation or
surrounding inflammatory changes.

Spleen: Normal in size without focal abnormality.

Adrenals/Urinary Tract: Adrenal glands are within normal limits. The
right kidney is ptotic and shows mild rotation although it stable
from the prior exam. No renal calculi are identified. No obstructive
changes are seen. Bladder is partially distended.

Stomach/Bowel: The appendix is within normal limits. No inflammatory
or obstructive changes are identified in the large and small bowel.
Stomach is unremarkable. Stable thickening of the distal esophagus
is seen.

Vascular/Lymphatic: No significant vascular findings are present. No
enlarged abdominal or pelvic lymph nodes.

Reproductive: Prostate is unremarkable.

Other: No abdominal wall hernia or abnormality. No abdominopelvic
ascites.

Musculoskeletal: No acute or significant osseous findings.
IMPRESSION: Stable thickening of the distal esophagus.

Normal-appearing appendix.

No other focal abnormality is noted.

## 2019-02-26 ENCOUNTER — Emergency Department (HOSPITAL_COMMUNITY)
Admission: EM | Admit: 2019-02-26 | Discharge: 2019-02-26 | Disposition: A | Discharge status: Home / Self Care | Payer: PPO | Source: Home / Self Care | Attending: Emergency Medicine | Admitting: Emergency Medicine

## 2019-02-26 ENCOUNTER — Other Ambulatory Visit: Payer: Self-pay

## 2019-02-26 ENCOUNTER — Emergency Department (HOSPITAL_COMMUNITY): Payer: PPO

## 2019-02-26 ENCOUNTER — Encounter (HOSPITAL_COMMUNITY): Payer: Self-pay

## 2019-02-26 ENCOUNTER — Emergency Department (HOSPITAL_COMMUNITY)
Admission: EM | Admit: 2019-02-26 | Discharge: 2019-02-26 | Disposition: A | Discharge status: Home / Self Care | Payer: PPO | Attending: Emergency Medicine | Admitting: Emergency Medicine

## 2019-02-26 DIAGNOSIS — E1122 Type 2 diabetes mellitus with diabetic chronic kidney disease: Secondary | ICD-10-CM | POA: Insufficient documentation

## 2019-02-26 DIAGNOSIS — R109 Unspecified abdominal pain: Secondary | ICD-10-CM | POA: Diagnosis not present

## 2019-02-26 DIAGNOSIS — R112 Nausea with vomiting, unspecified: Secondary | ICD-10-CM | POA: Diagnosis not present

## 2019-02-26 DIAGNOSIS — R197 Diarrhea, unspecified: Secondary | ICD-10-CM | POA: Diagnosis not present

## 2019-02-26 DIAGNOSIS — N183 Chronic kidney disease, stage 3 (moderate): Secondary | ICD-10-CM | POA: Insufficient documentation

## 2019-02-26 DIAGNOSIS — I10 Essential (primary) hypertension: Secondary | ICD-10-CM

## 2019-02-26 DIAGNOSIS — Z79899 Other long term (current) drug therapy: Secondary | ICD-10-CM | POA: Insufficient documentation

## 2019-02-26 DIAGNOSIS — Z5321 Procedure and treatment not carried out due to patient leaving prior to being seen by health care provider: Secondary | ICD-10-CM | POA: Diagnosis not present

## 2019-02-26 DIAGNOSIS — R1084 Generalized abdominal pain: Secondary | ICD-10-CM | POA: Insufficient documentation

## 2019-02-26 DIAGNOSIS — Z794 Long term (current) use of insulin: Secondary | ICD-10-CM | POA: Insufficient documentation

## 2019-02-26 DIAGNOSIS — I129 Hypertensive chronic kidney disease with stage 1 through stage 4 chronic kidney disease, or unspecified chronic kidney disease: Secondary | ICD-10-CM | POA: Insufficient documentation

## 2019-02-26 DIAGNOSIS — R1110 Vomiting, unspecified: Secondary | ICD-10-CM | POA: Diagnosis not present

## 2019-02-26 DIAGNOSIS — F17200 Nicotine dependence, unspecified, uncomplicated: Secondary | ICD-10-CM | POA: Insufficient documentation

## 2019-02-26 LAB — URINALYSIS, ROUTINE W REFLEX MICROSCOPIC
Bacteria, UA: NONE SEEN
Bilirubin Urine: NEGATIVE
Glucose, UA: >=500 mg/dL — AB
Hgb urine dipstick: NEGATIVE
Ketones, ur: NEGATIVE mg/dL
Leukocytes,Ua: NEGATIVE
Nitrite: NEGATIVE
Protein, ur: >=300 mg/dL — AB
Specific Gravity, Urine: 1.027 (ref 1.005–1.030)
pH: 6 (ref 5.0–8.0)

## 2019-02-26 LAB — COMPREHENSIVE METABOLIC PANEL
ALT: 24 U/L (ref 0–44)
AST: 26 U/L (ref 15–41)
Albumin: 3.8 g/dL (ref 3.5–5.0)
Alkaline Phosphatase: 103 U/L (ref 38–126)
Anion gap: 14 (ref 5–15)
BUN: 14 mg/dL (ref 6–20)
CO2: 22 mmol/L (ref 22–32)
Calcium: 9.9 mg/dL (ref 8.9–10.3)
Chloride: 102 mmol/L (ref 98–111)
Creatinine, Ser: 1.93 mg/dL — ABNORMAL HIGH (ref 0.61–1.24)
GFR calc Af Amer: 48 mL/min — ABNORMAL LOW (ref >60)
GFR calc non Af Amer: 42 mL/min — ABNORMAL LOW (ref >60)
Glucose, Bld: 241 mg/dL — ABNORMAL HIGH (ref 70–99)
Potassium: 4.2 mmol/L (ref 3.5–5.1)
Sodium: 138 mmol/L (ref 135–145)
Total Bilirubin: 0.8 mg/dL (ref 0.3–1.2)
Total Protein: 8.3 g/dL — ABNORMAL HIGH (ref 6.5–8.1)

## 2019-02-26 LAB — CBC
HCT: 44.8 % (ref 39.0–52.0)
Hemoglobin: 14.8 g/dL (ref 13.0–17.0)
MCH: 31.1 pg (ref 26.0–34.0)
MCHC: 33 g/dL (ref 30.0–36.0)
MCV: 94.1 fL (ref 80.0–100.0)
Platelets: 227 10*3/uL (ref 150–400)
RBC: 4.76 MIL/uL (ref 4.22–5.81)
RDW: 13.7 % (ref 11.5–15.5)
WBC: 8.8 10*3/uL (ref 4.0–10.5)
nRBC: 0 % (ref 0.0–0.2)

## 2019-02-26 LAB — LIPASE, BLOOD: Lipase: 29 U/L (ref 11–51)

## 2019-02-26 LAB — RAPID URINE DRUG SCREEN, HOSP PERFORMED
Amphetamines: NOT DETECTED
Barbiturates: NOT DETECTED
Benzodiazepines: NOT DETECTED
Cocaine: NOT DETECTED
Opiates: NOT DETECTED
Tetrahydrocannabinol: POSITIVE — AB

## 2019-02-26 MED ORDER — DICYCLOMINE HCL 20 MG PO TABS: 20.0000 mg | ORAL_TABLET | Freq: Two times a day (BID) | ORAL | 0 refills | Status: DC

## 2019-02-26 MED ORDER — ONDANSETRON 4 MG PO TBDP
4.0000 mg | ORAL_TABLET | Freq: Once | ORAL | Status: AC | PRN
  Administered 2019-02-26: 4 mg via ORAL
  Filled 2019-02-26: qty 1

## 2019-02-26 MED ORDER — ONDANSETRON 4 MG PO TBDP: 4.0000 mg | ORAL_TABLET | Freq: Three times a day (TID) | ORAL | 0 refills | Status: DC | PRN

## 2019-02-26 MED ORDER — IOHEXOL 300 MG/ML  SOLN
100.0000 mL | Freq: Once | INTRAMUSCULAR | Status: AC | PRN
  Administered 2019-02-26: 17:00:00 80 mL via INTRAVENOUS

## 2019-02-26 MED ORDER — SODIUM CHLORIDE 0.9% FLUSH: 3.0000 mL | Freq: Once | INTRAVENOUS | Status: DC

## 2019-02-26 MED ORDER — ALUM & MAG HYDROXIDE-SIMETH 200-200-20 MG/5ML PO SUSP
30.0000 mL | Freq: Once | ORAL | Status: AC
  Administered 2019-02-26: 30 mL via ORAL
  Filled 2019-02-26: qty 30

## 2019-02-26 MED ORDER — FENTANYL CITRATE (PF) 100 MCG/2ML IJ SOLN
50.0000 ug | Freq: Once | INTRAMUSCULAR | Status: AC
  Administered 2019-02-26: 19:00:00 50 ug via INTRAVENOUS
  Filled 2019-02-26: qty 2

## 2019-02-26 MED ORDER — FAMOTIDINE IN NACL 20-0.9 MG/50ML-% IV SOLN
20.0000 mg | Freq: Once | INTRAVENOUS | Status: AC
  Administered 2019-02-26: 20 mg via INTRAVENOUS
  Filled 2019-02-26: qty 50

## 2019-02-26 MED ORDER — ONDANSETRON 4 MG PO TBDP
4.0000 mg | ORAL_TABLET | Freq: Once | ORAL | Status: AC
  Administered 2019-02-26: 4 mg via ORAL

## 2019-02-26 MED ORDER — PROMETHAZINE HCL 25 MG/ML IJ SOLN
12.5000 mg | Freq: Once | INTRAMUSCULAR | Status: AC
  Administered 2019-02-26: 12.5 mg via INTRAVENOUS
  Filled 2019-02-26: qty 1

## 2019-02-26 MED ORDER — SODIUM CHLORIDE 0.9 % IV BOLUS
1000.0000 mL | Freq: Once | INTRAVENOUS | Status: AC
  Administered 2019-02-26: 1000 mL via INTRAVENOUS

## 2019-02-26 MED ORDER — LIDOCAINE VISCOUS HCL 2 % MT SOLN
15.0000 mL | Freq: Once | OROMUCOSAL | Status: AC
  Administered 2019-02-26: 15 mL via ORAL
  Filled 2019-02-26: qty 15

## 2019-02-26 MED ORDER — AMLODIPINE BESYLATE 5 MG PO TABS
10.0000 mg | ORAL_TABLET | Freq: Once | ORAL | Status: AC
  Administered 2019-02-26: 10 mg via ORAL
  Filled 2019-02-26: qty 2

## 2019-02-26 MED ORDER — FENTANYL CITRATE (PF) 100 MCG/2ML IJ SOLN
50.0000 ug | Freq: Once | INTRAMUSCULAR | Status: AC
  Administered 2019-02-26: 50 ug via INTRAVENOUS
  Filled 2019-02-26: qty 2

## 2019-02-26 NOTE — Discharge Instructions (Addendum)
1. Medications: Take Zofran as needed for nausea.  Let this medicine dissolve under your tongue and wait around 10-20 minutes before eating or drinking after taking this medication.  You can take Bentyl as needed for cramping abdominal pain. 2. Treatment: rest, drink plenty of fluids, advance diet slowly.  Start with water and broth then advance to bland foods that will not upset your stomach such as crackers, mashed potatoes, and peanut butter. 3. Follow Up: Please followup with your primary doctor or gastroenterologist in 3 days for discussion of your diagnoses and further evaluation after today's visit; if you do not have a primary care doctor use the resource guide provided to find one; Please return to the ER for persistent vomiting, high fevers or worsening symptoms.   If your blood pressure (BP) was elevated on multiple readings during this visit above 130 for the top number or above 80 for the bottom number, please have this repeated by your primary care provider within one month. You can also check your blood pressure when you are out at a pharmacy or grocery store. Many have machines that will check your blood pressure.  If your blood pressure remains elevated, please follow-up with your PCP.

## 2019-02-26 NOTE — ED Notes (Signed)
Pt actively vomiting.  PA aware.

## 2019-02-26 NOTE — ED Notes (Signed)
Patient transported to CT 

## 2019-02-26 NOTE — ED Notes (Signed)
Pt reminded of the need for urine.  

## 2019-02-26 NOTE — ED Triage Notes (Signed)
Pt complains of abd pain with n/v x 2 days. Here earlier and left due to the wait.

## 2019-02-26 NOTE — ED Provider Notes (Signed)
[facility] EMERGENCY DEPARTMENT Provider Note   CSN: [id number] Arrival date & time: 02/26/19  1245     History   Chief Complaint Chief Complaint  Patient presents with   Abdominal Pain    HPI [name] is a 43 y.o. male with history of CKD, type 2 diabetes mellitus, polysubstance abuse, hypertension, hyperlipidemia, degenerative disc disease presents for evaluation of acute onset, persistent nausea and vomiting for 2 days.  Reports left-sided abdominal pain which is constant, sharp, worsens with laying flat, improved somewhat leaning forward.  He has had 2 episodes of nonbloody watery diarrhea and several episodes of nonbloody nonbilious emesis.  No fever, no recent travel or recent treatment with antibiotics.  No known sick contacts.  Denies suspicious food intake.  Has tried Aleve without improvement in symptoms.  Has been seen and evaluated in the ED for similar presentations in the past.  Status post laparoscopic cholecystectomy.  He is a current smoker of a few cigarettes daily, smokes marijuana every few weeks, denies alcohol use.     The history is provided by the patient.    Past Medical History:  Diagnosis Date   Acquired contracture of Achilles tendon, right    Acute osteomyelitis, ankle and foot 07/30/2010   Qualifier: Diagnosis of  By: Tommy Medal MD, Roderic Scarce     Anemia    Chronic kidney disease (CKD), stage III (moderate) ([place])    Chronic osteomyelitis of right foot ([place])    Collagen vascular disease ([place])    DDD (degenerative disc disease), lumbar    Dehiscence of amputation stump ([place])     dehiscence right transmetetarsal amputation achilles contracture   Diabetic foot ulcer ([place]) 05/14/2017   Diabetic foot ulcer with osteomyelitis ([place]) 05/12/2013   Gastroparesis    GERD (gastroesophageal reflux disease)    Headache    Hiatal hernia    Hyperlipidemia    Hypertension    Pancreatitis    Peripheral vascular disease ([place])     Polysubstance abuse ([place]) 03/08/2016   Renal insufficiency    Status post transmetatarsal amputation of foot, right ([place]) 07/10/2016   Type II diabetes mellitus ([place]) dx'd ~ 1996   Vascular disease    poor circulation to left foot    Patient Active Problem List   Diagnosis Date Noted   Hyperlipidemia 04/01/2018   Hypertension 04/01/2018   Chronic anemia    Diabetes mellitus type 2 with complications, uncontrolled ([place])    Diabetic peripheral neuropathy (HCC)    PVD (peripheral vascular disease) (HCC)    Hypokalemia    Acute blood loss anemia    Post-operative pain    Unilateral complete BKA, right, subsequent encounter ([place])    Diabetic polyneuropathy associated with type 2 diabetes mellitus ([employer]) 07/10/2016   Polysubstance abuse ([place]) 03/08/2016   Tobacco abuse 02/27/2016   Gastroparesis 05/28/2015   GERD (gastroesophageal reflux disease) 10/01/2014   Esophageal reflux    Fever 09/27/2014   Abdominal pain, lower 09/27/2014   Abnormal ECG 05/30/2014   Chest pain 05/30/2014   Ankle pain 05/30/2014   Pain in the chest    Nausea 08/30/2013   Epigastric abdominal pain 08/30/2013   Low grade fever 08/30/2013   Diabetic osteomyelitis b/l toes 03/23/2013   DM (diabetes mellitus) type II uncontrolled, periph vascular disorder ([place]) 03/23/2013   CKD (chronic kidney disease), stage III ([place]) 03/23/2013   Anemia 03/23/2013   Benign essential HTN 03/23/2013   AKI (acute kidney injury) ([place]) 03/22/2013   Viral  gastroenteritis 09/17/2012   Nausea & vomiting 09/16/2012   Acute pancreatitis 09/16/2012   Diabetes mellitus ([place]) 09/20/2010   Onychomycosis 09/20/2010   METHICILLIN SUSCEPTIBLE STAPH AUREUS SEPTICEMIA 07/30/2010    Past Surgical History:  Procedure Laterality Date   AMPUTATION  04/25/2011   Procedure: AMPUTATION DIGIT;  Surgeon: Newt Minion, MD;  Location: [facility];  Service: Orthopedics;  Laterality: Left;  Left foot 3rd toe  amputation MTP joint, Gastroc Recession  Achilles Lengthening    AMPUTATION Bilateral 03/25/2013   Procedure: AMPUTATION RAY;  Surgeon: Newt Minion, MD;  Location: [facility];  Service: Orthopedics;  Laterality: Bilateral;  Left Great Toe Amputation at  MTP Joint, Right 1st and 2nd Ray Amputation    AMPUTATION Right 10/03/2014   Procedure: AMPUTATION MIDFOOT;  Surgeon: Newt Minion, MD;  Location: [facility];  Service: Orthopedics;  Laterality: Right;   AMPUTATION Right 07/14/2017   Procedure: RIGHT BELOW KNEE AMPUTATION;  Surgeon: Newt Minion, MD;  Location: [facility];  Service: Orthopedics;  Laterality: Right;   LAPAROSCOPIC CHOLECYSTECTOMY     STUMP REVISION Right 05/23/2016   Procedure: Revision Right Transmetatarsal Amputation, Right Gastrocnemius Recession;  Surgeon: Newt Minion, MD;  Location: [facility];  Service: Orthopedics;  Laterality: Right;   STUMP REVISION Right 05/15/2017   Procedure: REVISION RIGHT TRANSMETATARSAL AMPUTATION;  Surgeon: Newt Minion, MD;  Location: [facility];  Service: Orthopedics;  Laterality: Right;   TOE AMPUTATION  2012   left foot; great toe and second toe        Home Medications    Prior to Admission medications   Medication Sig Start Date End Date Taking? Authorizing Provider  amLODipine (NORVASC) 10 MG tablet Take 10 mg by mouth daily. 04/07/18   [provider]  amLODipine (NORVASC) 5 MG tablet Take 1 tablet (5 mg total) by mouth daily. 07/17/17   Nita Sells, MD  atorvastatin (LIPITOR) 10 MG tablet Take 10 mg by mouth daily. 03/29/17   [provider]  blood glucose meter kit and supplies KIT Dispense based on patient and insurance preference. Use up to four times daily as directed. (FOR ICD-9 250.00, 250.01). 05/16/17   Rai, Vernelle Emerald, MD  dicyclomine (BENTYL) 20 MG tablet Take 1 tablet (20 mg total) by mouth 2 (two) times daily. 02/26/19   Graiden Henes A, PA-C  famotidine (PEPCID) 20 MG tablet Take 1 tablet (20 mg total) by mouth 2  (two) times daily. Patient taking differently: Take 20 mg by mouth 2 (two) times daily as needed for heartburn or indigestion.  04/12/18   Robinson, [place] N, PA-C  hydroxypropyl methylcellulose / hypromellose (ISOPTO TEARS / GONIOVISC) 2.5 % ophthalmic solution Place 1 drop into both eyes as needed for dry eyes.    [provider]  insulin aspart (NOVOLOG) 100 UNIT/ML injection Inject 8 Units into the skin 3 (three) times daily with meals. Patient taking differently: Inject 3 Units into the skin 3 (three) times daily with meals.  07/17/17   Nita Sells, MD  insulin glargine (LANTUS) 100 UNIT/ML injection Inject 0.05 mLs (5 Units total) into the skin daily. Patient taking differently: Inject 8 Units into the skin daily.  07/17/17   Nita Sells, MD  Insulin Syringes, Disposable, U-100 0.5 ML MISC Use with lantus and novolog vials. 05/16/17   Rai, Vernelle Emerald, MD  omeprazole (PRILOSEC OTC) 20 MG tablet Take 20 mg by mouth daily as needed (indigestion).     [provider]  ondansetron [facility]  ODT) 4 MG disintegrating tablet Take 1 tablet (4 mg total) by mouth every 8 (eight) hours as needed for nausea or vomiting. 02/26/19   Rodell Perna A, PA-C  sucralfate (CARAFATE) 1 g tablet Take 1 tablet (1 g total) by mouth 4 (four) times daily -  with meals and at bedtime. 04/12/18   Robinson, [place] N, PA-C  pantoprazole (PROTONIX) 40 MG tablet Take 1 tablet (40 mg total) by mouth 2 (two) times daily. Patient not taking: Reported on 10/01/2014 09/30/14 05/26/15  Barton Dubois, MD    Family History Family History  Problem Relation Age of Onset   Heart attack Father 62   Hypertension Sister     Social History Social History   Tobacco Use   Smoking status: Former Smoker    Packs/day: 0.10    Years: 4.00    Pack years: 0.40    Types: Cigarettes    Quit date: 06/10/2015    Years since quitting: 3.7   Smokeless tobacco: Never Used  Substance Use Topics   Alcohol use: No     Drug use: Yes    Frequency: 10.0 times per week    Types: Marijuana     Allergies   Patient has no known allergies.   Review of Systems Review of Systems  Constitutional: Negative for chills and fever.  Respiratory: Negative for shortness of breath.   Cardiovascular: Negative for chest pain.  Gastrointestinal: Positive for abdominal pain, diarrhea, nausea and vomiting.  Genitourinary: Negative for dysuria, frequency, hematuria and urgency.  All other systems reviewed and are negative.    Physical Exam Updated Vital Signs BP (!) 195/105    Pulse 79    Temp 98.6 F (37 C) (Oral)    Resp 15    SpO2 98%   Physical Exam Vitals signs and nursing note reviewed.  Constitutional:      General: He is not in acute distress.    Appearance: He is well-developed.     Comments: Appears uncomfortable  HENT:     Head: Normocephalic and atraumatic.  Eyes:     General:        Right eye: No discharge.        Left eye: No discharge.     Conjunctiva/sclera: Conjunctivae normal.  Neck:     Vascular: No JVD.     Trachea: No tracheal deviation.  Cardiovascular:     Rate and Rhythm: Normal rate and regular rhythm.  Pulmonary:     Effort: Pulmonary effort is normal.     Breath sounds: Normal breath sounds.  Abdominal:     General: Abdomen is protuberant. Bowel sounds are decreased. There is no distension.     Palpations: Abdomen is soft.     Tenderness: There is abdominal tenderness in the left upper quadrant and left lower quadrant. There is left CVA tenderness. There is no guarding or rebound. Negative signs include Murphy's sign and Rovsing's sign.  Skin:    General: Skin is warm and dry.     Findings: No erythema.  Neurological:     Mental Status: He is alert.  Psychiatric:        Behavior: Behavior normal.      ED Treatments / Results  Labs (all labs ordered are listed, but only abnormal results are displayed) Labs Reviewed  URINALYSIS, ROUTINE W REFLEX MICROSCOPIC -  Abnormal; Notable for the following components:      Result Value   Color, Urine STRAW (*)    Glucose, UA >=  500 (*)    Protein, ur >=300 (*)    All other components within normal limits  RAPID URINE DRUG SCREEN, HOSP PERFORMED - Abnormal; Notable for the following components:   Tetrahydrocannabinol POSITIVE (*)    All other components within normal limits      EKG EKG Interpretation  Date/Time:  Saturday February 26 2019 16:16:13 EDT Ventricular Rate:  76 PR Interval:    QRS Duration: 86 QT Interval:  373 QTC Calculation: 420 R Axis:   -11 Text Interpretation:  Sinus rhythm Nonspecific T wave abnormality No significant change since last tracing Confirmed by Lajean Saver [phone]) on 02/26/2019 4:20:53 PM   Radiology Ct Abdomen Pelvis W Contrast  Result Date: 02/26/2019 CLINICAL DATA:  Abdominal pain, diverticulitis suspected versus small-bowel obstruction. Two days of generalized abdominal pain with vomiting. EXAM: CT ABDOMEN AND PELVIS WITH CONTRAST TECHNIQUE: Multidetector CT imaging of the abdomen and pelvis was performed using the standard protocol following bolus administration of intravenous contrast. CONTRAST:  16m OMNIPAQUE IOHEXOL 300 MG/ML  SOLN COMPARISON:  CT abdomen dated 05/25/2018. FINDINGS: Lower chest: No acute abnormality. Hepatobiliary: No focal liver abnormality is seen. Status post cholecystectomy. No biliary dilatation. Pancreas: Unremarkable. No pancreatic ductal dilatation or surrounding inflammatory changes. Spleen: Normal in size without focal abnormality. Adrenals/Urinary Tract: Adrenal glands appear normal. Partial malrotation of the RIGHT kidney with anterior facing renal pelvis. Kidneys otherwise unremarkable without mass, stone or hydronephrosis. No ureteral or bladder calculi identified. Bladder appears normal, partially decompressed. Stomach/Bowel: No dilated large or small bowel loops. No evidence of bowel wall inflammation. Appendix is normal. Stomach  is unremarkable, partially decompressed. Mild diverticulosis of the sigmoid colon but no focal inflammatory change to suggest acute diverticulitis at this time. Vascular/Lymphatic: No significant vascular findings are present. No enlarged abdominal or pelvic lymph nodes. Reproductive: Prostate is unremarkable. Other: No free fluid or abscess collection. No free intraperitoneal air. Musculoskeletal: Advanced degenerative disc disease at the L4-5 and L5-S1 levels with disc space narrowings, vacuum disc phenomenon, endplate sclerosis and mild osseous spurring resulting in mild to moderate central canal stenoses and probable nerve root impingement at 1 or both levels. No acute or suspicious osseous finding. IMPRESSION: 1. No acute findings within the abdomen or pelvis. No bowel obstruction or evidence of bowel wall inflammation. No evidence of acute solid organ abnormality. No renal or ureteral calculi. Appendix is normal. 2. Mild sigmoid colon diverticulosis without evidence of acute diverticulitis at this time. 3. Advanced degenerative disc disease at the L4-5 and L5-S1 levels, as detailed above. Electronically Signed   By: SFranki CabotM.D.   On: 02/26/2019 17:07    Procedures Procedures (including critical care time)  Medications Ordered in ED Medications  fentaNYL (SUBLIMAZE) injection 50 mcg (has no administration in time range)  ondansetron (ZOFRAN-ODT) disintegrating tablet 4 mg (4 mg Oral Given 02/26/19 1311)  sodium chloride 0.9 % bolus 1,000 mL (0 mLs Intravenous Stopped 02/26/19 1730)  famotidine (PEPCID) IVPB 20 mg premix (0 mg Intravenous Stopped 02/26/19 1615)  alum & mag hydroxide-simeth (MAALOX/MYLANTA) 200-200-20 MG/5ML suspension 30 mL (30 mLs Oral Given 02/26/19 1540)    And  lidocaine (XYLOCAINE) 2 % viscous mouth solution 15 mL (15 mLs Oral Given 02/26/19 1540)  fentaNYL (SUBLIMAZE) injection 50 mcg (50 mcg Intravenous Given 02/26/19 1540)  promethazine (PHENERGAN) injection 12.5 mg  (12.5 mg Intravenous Given 02/26/19 1559)  iohexol (OMNIPAQUE) 300 MG/ML solution 100 mL (80 mLs Intravenous Contrast Given 02/26/19 1631)  amLODipine (NORVASC) tablet 10 mg (10  mg Oral Given 02/26/19 1733)     Initial Impression / Assessment and Plan / ED Course  I have reviewed the triage vital signs and the nursing notes.  Pertinent labs & imaging results that were available during my care of the patient were reviewed by me and considered in my medical decision making (see chart for details).        Patient presenting for evaluation of persistent nausea vomiting and left-sided abdominal pain for the last 3 days.  He is afebrile, persistently hypertensive in the ED but has not been able to tolerate his home hypertension medications due to his symptoms.  He is uncomfortable but nontoxic in appearance.  No peritoneal signs on examination of the abdomen.  Lab work today reviewed by me shows no leukocytosis, no anemia, hyperglycemia but no evidence of DKA on lab work today.  UA shows worsening glucosuria and proteinuria likely due to worsening control of his diabetes but his creatinine is within normal limits today.  CT scan shows no evidence of acute surgical abdominal pathology.  He was given antiemetics, IV fluids, and pain medicine in the ED and on reevaluation reports that he is feeling better.  He is tolerating sips of fluid without difficulty.  Serial abdominal examinations remained benign.  Differential includes gastroparesis, hyperemesis cannabinoid, gastritis/gastroenteritis.  He was given a dose of his home medications orally which he tolerated without difficulty and had some improvement in his hypertension. He has been seen by [facility] gastroenterology previously.  Recommend follow-up with him or his PCP for reevaluation.  He has had good symptom control with Zofran ODT at home so we will discharge him with some of this and Bentyl for management of cramping abdominal pain.  Discussed strict ED  return precautions.  Patient and wife verbalized understanding of and agreement with plan and patient stable for discharge home at this time.  Final Clinical Impressions(s) / ED Diagnoses   Final diagnoses:  Left sided abdominal pain  Nausea vomiting and diarrhea  Hypertension, unspecified type    ED Discharge Orders         Ordered    ondansetron (ZOFRAN ODT) 4 MG disintegrating tablet  Every 8 hours PRN     02/26/19 1821    dicyclomine (BENTYL) 20 MG tablet  2 times daily     02/26/19 1821           Renita Papa, PA-C 02/26/19 1833    Lajean Saver, MD 02/27/19 [phone]

## 2019-02-26 NOTE — ED Triage Notes (Signed)
Patient complains of 2 days of generalized abdominal pain with vomiting. Denies diarrhea, denies fever. Alert and oriented, vomiting on arrival

## 2019-02-26 NOTE — ED Notes (Signed)
Pt provided urinal and reminded of the need for urine.  

## 2019-02-26 NOTE — ED Notes (Signed)
Patient verbalizes understanding of discharge instructions. Opportunity for questioning and answers were provided. Armband removed by staff, pt discharged from ED.  

## 2019-02-28 ENCOUNTER — Emergency Department (HOSPITAL_COMMUNITY)
Admission: EM | Admit: 2019-02-28 | Discharge: 2019-02-28 | Disposition: A | Discharge status: Home / Self Care | Payer: PPO | Attending: Emergency Medicine | Admitting: Emergency Medicine

## 2019-02-28 ENCOUNTER — Other Ambulatory Visit: Payer: Self-pay

## 2019-02-28 DIAGNOSIS — N183 Chronic kidney disease, stage 3 (moderate): Secondary | ICD-10-CM | POA: Insufficient documentation

## 2019-02-28 DIAGNOSIS — Z79899 Other long term (current) drug therapy: Secondary | ICD-10-CM | POA: Diagnosis not present

## 2019-02-28 DIAGNOSIS — Z794 Long term (current) use of insulin: Secondary | ICD-10-CM | POA: Diagnosis not present

## 2019-02-28 DIAGNOSIS — Z87891 Personal history of nicotine dependence: Secondary | ICD-10-CM | POA: Diagnosis not present

## 2019-02-28 DIAGNOSIS — E1122 Type 2 diabetes mellitus with diabetic chronic kidney disease: Secondary | ICD-10-CM | POA: Insufficient documentation

## 2019-02-28 DIAGNOSIS — R112 Nausea with vomiting, unspecified: Secondary | ICD-10-CM | POA: Diagnosis not present

## 2019-02-28 DIAGNOSIS — I129 Hypertensive chronic kidney disease with stage 1 through stage 4 chronic kidney disease, or unspecified chronic kidney disease: Secondary | ICD-10-CM | POA: Diagnosis not present

## 2019-02-28 LAB — COMPREHENSIVE METABOLIC PANEL
ALT: 23 U/L (ref 0–44)
AST: 26 U/L (ref 15–41)
Albumin: 3.9 g/dL (ref 3.5–5.0)
Alkaline Phosphatase: 93 U/L (ref 38–126)
Anion gap: 11 (ref 5–15)
BUN: 13 mg/dL (ref 6–20)
CO2: 26 mmol/L (ref 22–32)
Calcium: 9.4 mg/dL (ref 8.9–10.3)
Chloride: 100 mmol/L (ref 98–111)
Creatinine, Ser: 1.94 mg/dL — ABNORMAL HIGH (ref 0.61–1.24)
GFR calc Af Amer: 48 mL/min — ABNORMAL LOW (ref >60)
GFR calc non Af Amer: 41 mL/min — ABNORMAL LOW (ref >60)
Glucose, Bld: 174 mg/dL — ABNORMAL HIGH (ref 70–99)
Potassium: 3.9 mmol/L (ref 3.5–5.1)
Sodium: 137 mmol/L (ref 135–145)
Total Bilirubin: 0.7 mg/dL (ref 0.3–1.2)
Total Protein: 8.2 g/dL — ABNORMAL HIGH (ref 6.5–8.1)

## 2019-02-28 LAB — CBC
HCT: 43.4 % (ref 39.0–52.0)
Hemoglobin: 14.7 g/dL (ref 13.0–17.0)
MCH: 31.4 pg (ref 26.0–34.0)
MCHC: 33.9 g/dL (ref 30.0–36.0)
MCV: 92.7 fL (ref 80.0–100.0)
Platelets: 229 10*3/uL (ref 150–400)
RBC: 4.68 MIL/uL (ref 4.22–5.81)
RDW: 13.9 % (ref 11.5–15.5)
WBC: 7.7 10*3/uL (ref 4.0–10.5)
nRBC: 0 % (ref 0.0–0.2)

## 2019-02-28 LAB — LIPASE, BLOOD: Lipase: 32 U/L (ref 11–51)

## 2019-02-28 MED ORDER — METOCLOPRAMIDE HCL 10 MG PO TABS: 10.0000 mg | ORAL_TABLET | Freq: Three times a day (TID) | ORAL | 0 refills | Status: DC | PRN

## 2019-02-28 MED ORDER — DROPERIDOL 2.5 MG/ML IJ SOLN
2.5000 mg | Freq: Once | INTRAMUSCULAR | Status: AC
  Administered 2019-02-28: 2.5 mg via INTRAVENOUS
  Filled 2019-02-28: qty 2

## 2019-02-28 MED ORDER — SODIUM CHLORIDE 0.9 % IV BOLUS
1000.0000 mL | Freq: Once | INTRAVENOUS | Status: AC
  Administered 2019-02-28: 08:00:00 1000 mL via INTRAVENOUS

## 2019-02-28 MED ORDER — SODIUM CHLORIDE 0.9% FLUSH: 3.0000 mL | Freq: Once | INTRAVENOUS | Status: DC

## 2019-02-28 NOTE — ED Triage Notes (Signed)
Pt seen in ED 9-19 for v/d, abd pain.  Was discharged with zofran and bentyl.  Has been taking with no relief.  Unable to keep PO fluids down.  Urinating WNL.  Pt took Zofran PTA

## 2019-02-28 NOTE — ED Notes (Signed)
Pt given dc instructions pt verbalizes understanding.  

## 2019-02-28 NOTE — ED Provider Notes (Signed)
[facility] EMERGENCY DEPARTMENT Provider Note   CSN: [id number] Arrival date & time: 02/28/19  0522     History   Chief Complaint Chief Complaint  Patient presents with   Emesis    HPI [name] is a 43 y.o. male.     HPI  43 year old male presents with vomiting and abdominal pain. Has been ongoing for 3 days. Seen here 2 days ago. No fevers, back pain, diarrhea or urinary symptoms. No chest pain. Abdominal pain is left sided. This has been a recurrent problem for the past 6 months or so. Endorses some intermittent marijuana use.   Past Medical History:  Diagnosis Date   Acquired contracture of Achilles tendon, right    Acute osteomyelitis, ankle and foot 07/30/2010   Qualifier: Diagnosis of  By: Tommy Medal MD, Roderic Scarce     Anemia    Chronic kidney disease (CKD), stage III (moderate) ([place])    Chronic osteomyelitis of right foot ([place])    Collagen vascular disease ([place])    DDD (degenerative disc disease), lumbar    Dehiscence of amputation stump ([place])     dehiscence right transmetetarsal amputation achilles contracture   Diabetic foot ulcer ([place]) 05/14/2017   Diabetic foot ulcer with osteomyelitis ([place]) 05/12/2013   Gastroparesis    GERD (gastroesophageal reflux disease)    Headache    Hiatal hernia    Hyperlipidemia    Hypertension    Pancreatitis    Peripheral vascular disease ([place])    Polysubstance abuse ([place]) 03/08/2016   Renal insufficiency    Status post transmetatarsal amputation of foot, right ([place]) 07/10/2016   Type II diabetes mellitus ([place]) dx'd ~ 1996   Vascular disease    poor circulation to left foot    Patient Active Problem List   Diagnosis Date Noted   Hyperlipidemia 04/01/2018   Hypertension 04/01/2018   Chronic anemia    Diabetes mellitus type 2 with complications, uncontrolled ([place])    Diabetic peripheral neuropathy (HCC)    PVD (peripheral vascular disease) (HCC)    Hypokalemia    Acute  blood loss anemia    Post-operative pain    Unilateral complete BKA, right, subsequent encounter ([place])    Diabetic polyneuropathy associated with type 2 diabetes mellitus ([place]) 07/10/2016   Polysubstance abuse ([place]) 03/08/2016   Tobacco abuse 02/27/2016   Gastroparesis 05/28/2015   GERD (gastroesophageal reflux disease) 10/01/2014   Esophageal reflux    Fever 09/27/2014   Abdominal pain, lower 09/27/2014   Abnormal ECG 05/30/2014   Chest pain 05/30/2014   Ankle pain 05/30/2014   Pain in the chest    Nausea 08/30/2013   Epigastric abdominal pain 08/30/2013   Low grade fever 08/30/2013   Diabetic osteomyelitis b/l toes 03/23/2013   DM (diabetes mellitus) type II uncontrolled, periph vascular disorder ([place]) 03/23/2013   CKD (chronic kidney disease), stage III ([place]) 03/23/2013   Anemia 03/23/2013   Benign essential HTN 03/23/2013   AKI (acute kidney injury) ([place]) 03/22/2013   Viral gastroenteritis 09/17/2012   Nausea & vomiting 09/16/2012   Acute pancreatitis 09/16/2012   Diabetes mellitus ([place]) 09/20/2010   Onychomycosis 09/20/2010   METHICILLIN SUSCEPTIBLE STAPH AUREUS SEPTICEMIA 07/30/2010    Past Surgical History:  Procedure Laterality Date   AMPUTATION  04/25/2011   Procedure: AMPUTATION DIGIT;  Surgeon: Newt Minion, MD;  Location: [facility];  Service: Orthopedics;  Laterality: Left;  Left foot 3rd toe amputation MTP joint, Gastroc Recession  Achilles Lengthening  AMPUTATION Bilateral 03/25/2013   Procedure: AMPUTATION RAY;  Surgeon: Newt Minion, MD;  Location: [facility];  Service: Orthopedics;  Laterality: Bilateral;  Left Great Toe Amputation at  MTP Joint, Right 1st and 2nd Ray Amputation    AMPUTATION Right 10/03/2014   Procedure: AMPUTATION MIDFOOT;  Surgeon: Newt Minion, MD;  Location: [facility];  Service: Orthopedics;  Laterality: Right;   AMPUTATION Right 07/14/2017   Procedure: RIGHT BELOW KNEE AMPUTATION;  Surgeon: Newt Minion, MD;   Location: [facility];  Service: Orthopedics;  Laterality: Right;   LAPAROSCOPIC CHOLECYSTECTOMY     STUMP REVISION Right 05/23/2016   Procedure: Revision Right Transmetatarsal Amputation, Right Gastrocnemius Recession;  Surgeon: Newt Minion, MD;  Location: [facility];  Service: Orthopedics;  Laterality: Right;   STUMP REVISION Right 05/15/2017   Procedure: REVISION RIGHT TRANSMETATARSAL AMPUTATION;  Surgeon: Newt Minion, MD;  Location: [facility];  Service: Orthopedics;  Laterality: Right;   TOE AMPUTATION  2012   left foot; great toe and second toe        Home Medications    Prior to Admission medications   Medication Sig Start Date End Date Taking? Authorizing Provider  amLODipine (NORVASC) 10 MG tablet Take 10 mg by mouth daily. 04/07/18   [provider]  amLODipine (NORVASC) 5 MG tablet Take 1 tablet (5 mg total) by mouth daily. 07/17/17   Nita Sells, MD  atorvastatin (LIPITOR) 10 MG tablet Take 10 mg by mouth daily. 03/29/17   [provider]  blood glucose meter kit and supplies KIT Dispense based on patient and insurance preference. Use up to four times daily as directed. (FOR ICD-9 250.00, 250.01). 05/16/17   Rai, Vernelle Emerald, MD  dicyclomine (BENTYL) 20 MG tablet Take 1 tablet (20 mg total) by mouth 2 (two) times daily. 02/26/19   Fawze, Mina A, PA-C  famotidine (PEPCID) 20 MG tablet Take 1 tablet (20 mg total) by mouth 2 (two) times daily. Patient taking differently: Take 20 mg by mouth 2 (two) times daily as needed for heartburn or indigestion.  04/12/18   Robinson, [place] N, PA-C  hydroxypropyl methylcellulose / hypromellose (ISOPTO TEARS / GONIOVISC) 2.5 % ophthalmic solution Place 1 drop into both eyes as needed for dry eyes.    [provider]  insulin aspart (NOVOLOG) 100 UNIT/ML injection Inject 8 Units into the skin 3 (three) times daily with meals. Patient taking differently: Inject 3 Units into the skin 3 (three) times daily with meals.  07/17/17    Nita Sells, MD  insulin glargine (LANTUS) 100 UNIT/ML injection Inject 0.05 mLs (5 Units total) into the skin daily. Patient taking differently: Inject 8 Units into the skin daily.  07/17/17   Nita Sells, MD  Insulin Syringes, Disposable, U-100 0.5 ML MISC Use with lantus and novolog vials. 05/16/17   Rai, Vernelle Emerald, MD  metoCLOPramide (REGLAN) 10 MG tablet Take 1 tablet (10 mg total) by mouth every 8 (eight) hours as needed for nausea or vomiting. 02/28/19   Sherwood Gambler, MD  omeprazole (PRILOSEC OTC) 20 MG tablet Take 20 mg by mouth daily as needed (indigestion).     [provider]  ondansetron (ZOFRAN ODT) 4 MG disintegrating tablet Take 1 tablet (4 mg total) by mouth every 8 (eight) hours as needed for nausea or vomiting. 02/26/19   Nils Flack, Mina A, PA-C  sucralfate (CARAFATE) 1 g tablet Take 1 tablet (1 g total) by mouth 4 (four) times daily -  with meals and  at bedtime. 04/12/18   Robinson, [place] N, PA-C  pantoprazole (PROTONIX) 40 MG tablet Take 1 tablet (40 mg total) by mouth 2 (two) times daily. Patient not taking: Reported on 10/01/2014 09/30/14 05/26/15  Barton Dubois, MD    Family History Family History  Problem Relation Age of Onset   Heart attack Father 36   Hypertension Sister     Social History Social History   Tobacco Use   Smoking status: Former Smoker    Packs/day: 0.10    Years: 4.00    Pack years: 0.40    Types: Cigarettes    Quit date: 06/10/2015    Years since quitting: 3.7   Smokeless tobacco: Never Used  Substance Use Topics   Alcohol use: No   Drug use: Yes    Frequency: 10.0 times per week    Types: Marijuana     Allergies   Patient has no known allergies.   Review of Systems Review of Systems  Constitutional: Negative for fever.  Cardiovascular: Negative for chest pain.  Gastrointestinal: Positive for abdominal pain, nausea and vomiting. Negative for diarrhea.  Genitourinary: Negative for dysuria.    Musculoskeletal: Negative for back pain.  All other systems reviewed and are negative.    Physical Exam Updated Vital Signs BP (!) 205/89 (BP Location: Right Arm)    Pulse 81    Temp 98.2 F (36.8 C) (Oral)    Resp 18    SpO2 100%   Physical Exam Vitals signs and nursing note reviewed.  Constitutional:      Appearance: He is well-developed. He is obese.  HENT:     Head: Normocephalic and atraumatic.     Right Ear: External ear normal.     Left Ear: External ear normal.     Nose: Nose normal.  Eyes:     General:        Right eye: No discharge.        Left eye: No discharge.  Neck:     Musculoskeletal: Neck supple.  Cardiovascular:     Rate and Rhythm: Normal rate and regular rhythm.     Heart sounds: Normal heart sounds.  Pulmonary:     Effort: Pulmonary effort is normal.     Breath sounds: Normal breath sounds.  Abdominal:     Palpations: Abdomen is soft.     Tenderness: There is abdominal tenderness in the left upper quadrant and left lower quadrant.  Skin:    General: Skin is warm and dry.  Neurological:     Mental Status: He is alert.  Psychiatric:        Mood and Affect: Mood is not anxious.      ED Treatments / Results  Labs (all labs ordered are listed, but only abnormal results are displayed) Labs Reviewed  COMPREHENSIVE METABOLIC PANEL - Abnormal; Notable for the following components:      Result Value   Glucose, Bld 174 (*)    Creatinine, Ser 1.94 (*)    Total Protein 8.2 (*)    GFR calc non Af Amer 41 (*)    GFR calc Af Amer 48 (*)    All other components within normal limits  LIPASE, BLOOD  CBC  URINALYSIS, ROUTINE W REFLEX MICROSCOPIC    EKG EKG Interpretation  Date/Time:  Monday February 28 2019 07:27:32 EDT Ventricular Rate:  67 PR Interval:    QRS Duration: 94 QT Interval:  403 QTC Calculation: 426 R Axis:   -19 Text Interpretation:  Sinus  rhythm Consider left ventricular hypertrophy T wave changes improved since Sept 19 2020  Confirmed by Sherwood Gambler [phone]) on 02/28/2019 7:31:22 AM   Radiology Ct Abdomen Pelvis W Contrast  Result Date: 02/26/2019 CLINICAL DATA:  Abdominal pain, diverticulitis suspected versus small-bowel obstruction. Two days of generalized abdominal pain with vomiting. EXAM: CT ABDOMEN AND PELVIS WITH CONTRAST TECHNIQUE: Multidetector CT imaging of the abdomen and pelvis was performed using the standard protocol following bolus administration of intravenous contrast. CONTRAST:  68m OMNIPAQUE IOHEXOL 300 MG/ML  SOLN COMPARISON:  CT abdomen dated 05/25/2018. FINDINGS: Lower chest: No acute abnormality. Hepatobiliary: No focal liver abnormality is seen. Status post cholecystectomy. No biliary dilatation. Pancreas: Unremarkable. No pancreatic ductal dilatation or surrounding inflammatory changes. Spleen: Normal in size without focal abnormality. Adrenals/Urinary Tract: Adrenal glands appear normal. Partial malrotation of the RIGHT kidney with anterior facing renal pelvis. Kidneys otherwise unremarkable without mass, stone or hydronephrosis. No ureteral or bladder calculi identified. Bladder appears normal, partially decompressed. Stomach/Bowel: No dilated large or small bowel loops. No evidence of bowel wall inflammation. Appendix is normal. Stomach is unremarkable, partially decompressed. Mild diverticulosis of the sigmoid colon but no focal inflammatory change to suggest acute diverticulitis at this time. Vascular/Lymphatic: No significant vascular findings are present. No enlarged abdominal or pelvic lymph nodes. Reproductive: Prostate is unremarkable. Other: No free fluid or abscess collection. No free intraperitoneal air. Musculoskeletal: Advanced degenerative disc disease at the L4-5 and L5-S1 levels with disc space narrowings, vacuum disc phenomenon, endplate sclerosis and mild osseous spurring resulting in mild to moderate central canal stenoses and probable nerve root impingement at 1 or both levels. No  acute or suspicious osseous finding. IMPRESSION: 1. No acute findings within the abdomen or pelvis. No bowel obstruction or evidence of bowel wall inflammation. No evidence of acute solid organ abnormality. No renal or ureteral calculi. Appendix is normal. 2. Mild sigmoid colon diverticulosis without evidence of acute diverticulitis at this time. 3. Advanced degenerative disc disease at the L4-5 and L5-S1 levels, as detailed above. Electronically Signed   By: SFranki CabotM.D.   On: 02/26/2019 17:07    Procedures Procedures (including critical care time)  Medications Ordered in ED Medications  sodium chloride flush (NS) 0.9 % injection 3 mL (3 mLs Intravenous Not Given 02/28/19 0800)  sodium chloride 0.9 % bolus 1,000 mL (0 mLs Intravenous Stopped 02/28/19 0839)  droperidol (INAPSINE) 2.5 MG/ML injection 2.5 mg (2.5 mg Intravenous Given 02/28/19 0758)     Initial Impression / Assessment and Plan / ED Course  I have reviewed the triage vital signs and the nursing notes.  Pertinent labs & imaging results that were available during my care of the patient were reviewed by me and considered in my medical decision making (see chart for details).        Patient's lab work is benign, especially compared to his previous.  Abdominal exam is nonfocal and given recent negative CT scan just a couple days ago I do not think repeat is needed.  This is likely a cyclic vomiting or gastroparesis from his diabetes.  He is no longer vomiting after IV droperidol and fluids and now would like to go home.  I doubt UTI or other urine cause and I think he stable for discharge home.  Return precautions.  Final Clinical Impressions(s) / ED Diagnoses   Final diagnoses:  Non-intractable vomiting with nausea, unspecified vomiting type    ED Discharge Orders         Ordered  metoCLOPramide (REGLAN) 10 MG tablet  Every 8 hours PRN     02/28/19 0413           Sherwood Gambler, MD 02/28/19 [phone]

## 2019-02-28 NOTE — Discharge Instructions (Signed)
If you develop worsening, continued, or recurrent abdominal pain, uncontrolled vomiting, fever, chest or back pain, or any other new/concerning symptoms then return to the ER for evaluation.  

## 2019-03-21 DIAGNOSIS — Z3189 Encounter for other procreative management: Secondary | ICD-10-CM | POA: Diagnosis not present

## 2019-04-22 ENCOUNTER — Other Ambulatory Visit: Payer: Self-pay

## 2019-04-22 ENCOUNTER — Emergency Department (HOSPITAL_COMMUNITY): Payer: PPO

## 2019-04-22 ENCOUNTER — Emergency Department (HOSPITAL_COMMUNITY)
Admission: EM | Admit: 2019-04-22 | Discharge: 2019-04-23 | Disposition: A | Discharge status: Home / Self Care | Payer: PPO | Attending: Emergency Medicine | Admitting: Emergency Medicine

## 2019-04-22 DIAGNOSIS — E1122 Type 2 diabetes mellitus with diabetic chronic kidney disease: Secondary | ICD-10-CM | POA: Insufficient documentation

## 2019-04-22 DIAGNOSIS — I129 Hypertensive chronic kidney disease with stage 1 through stage 4 chronic kidney disease, or unspecified chronic kidney disease: Secondary | ICD-10-CM | POA: Diagnosis not present

## 2019-04-22 DIAGNOSIS — Y999 Unspecified external cause status: Secondary | ICD-10-CM | POA: Diagnosis not present

## 2019-04-22 DIAGNOSIS — Y939 Activity, unspecified: Secondary | ICD-10-CM | POA: Insufficient documentation

## 2019-04-22 DIAGNOSIS — Z794 Long term (current) use of insulin: Secondary | ICD-10-CM | POA: Diagnosis not present

## 2019-04-22 DIAGNOSIS — Y929 Unspecified place or not applicable: Secondary | ICD-10-CM | POA: Insufficient documentation

## 2019-04-22 DIAGNOSIS — I739 Peripheral vascular disease, unspecified: Secondary | ICD-10-CM | POA: Insufficient documentation

## 2019-04-22 DIAGNOSIS — N183 Chronic kidney disease, stage 3 unspecified: Secondary | ICD-10-CM | POA: Insufficient documentation

## 2019-04-22 DIAGNOSIS — T07XXXA Unspecified multiple injuries, initial encounter: Secondary | ICD-10-CM | POA: Diagnosis present

## 2019-04-22 DIAGNOSIS — R519 Headache, unspecified: Secondary | ICD-10-CM | POA: Diagnosis not present

## 2019-04-22 DIAGNOSIS — Z79899 Other long term (current) drug therapy: Secondary | ICD-10-CM | POA: Diagnosis not present

## 2019-04-22 DIAGNOSIS — S060X1A Concussion with loss of consciousness of 30 minutes or less, initial encounter: Secondary | ICD-10-CM

## 2019-04-22 DIAGNOSIS — S39012A Strain of muscle, fascia and tendon of lower back, initial encounter: Secondary | ICD-10-CM | POA: Diagnosis not present

## 2019-04-22 DIAGNOSIS — S069X9A Unspecified intracranial injury with loss of consciousness of unspecified duration, initial encounter: Secondary | ICD-10-CM | POA: Diagnosis not present

## 2019-04-22 DIAGNOSIS — Z87891 Personal history of nicotine dependence: Secondary | ICD-10-CM | POA: Insufficient documentation
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
## 2019-04-23 DIAGNOSIS — S060X1A Concussion with loss of consciousness of 30 minutes or less, initial encounter: Secondary | ICD-10-CM | POA: Diagnosis not present

## 2019-04-23 MED ORDER — IBUPROFEN 800 MG PO TABS: 800.0000 mg | ORAL_TABLET | Freq: Four times a day (QID) | ORAL | 0 refills | Status: DC | PRN

## 2019-04-23 MED ORDER — IBUPROFEN 800 MG PO TABS
800.0000 mg | ORAL_TABLET | Freq: Once | ORAL | Status: AC
  Administered 2019-04-23: 01:00:00 800 mg via ORAL
  Filled 2019-04-23: qty 1

## 2019-04-23 MED ORDER — METHOCARBAMOL 500 MG PO TABS: 500.0000 mg | ORAL_TABLET | Freq: Three times a day (TID) | ORAL | 0 refills | Status: DC | PRN

## 2019-04-23 MED ORDER — OXYCODONE-ACETAMINOPHEN 5-325 MG PO TABS
1.0000 | ORAL_TABLET | Freq: Once | ORAL | Status: AC
  Administered 2019-04-23: 01:00:00 1 via ORAL
  Filled 2019-04-23: qty 1

## 2019-04-23 MED ORDER — METHOCARBAMOL 500 MG PO TABS
500.0000 mg | ORAL_TABLET | Freq: Once | ORAL | Status: AC
  Administered 2019-04-23: 500 mg via ORAL
  Filled 2019-04-23: qty 1

## 2019-04-23 NOTE — ED Notes (Signed)
Discharge instructions discussed with pt. Pt verbalized understanding. Pt stable and ambulatory. No signature pad available. 
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
## 2019-06-17 ENCOUNTER — Other Ambulatory Visit: Payer: Self-pay

## 2019-06-17 ENCOUNTER — Encounter (HOSPITAL_COMMUNITY): Payer: Self-pay | Admitting: Emergency Medicine

## 2019-06-17 ENCOUNTER — Emergency Department (HOSPITAL_COMMUNITY)
Admission: EM | Admit: 2019-06-17 | Discharge: 2019-06-17 | Disposition: A | Discharge status: Home / Self Care | Payer: Medicare (Managed Care) | Attending: Emergency Medicine | Admitting: Emergency Medicine

## 2019-06-17 DIAGNOSIS — Z79899 Other long term (current) drug therapy: Secondary | ICD-10-CM | POA: Insufficient documentation

## 2019-06-17 DIAGNOSIS — E1122 Type 2 diabetes mellitus with diabetic chronic kidney disease: Secondary | ICD-10-CM | POA: Insufficient documentation

## 2019-06-17 DIAGNOSIS — N289 Disorder of kidney and ureter, unspecified: Secondary | ICD-10-CM | POA: Diagnosis not present

## 2019-06-17 DIAGNOSIS — I129 Hypertensive chronic kidney disease with stage 1 through stage 4 chronic kidney disease, or unspecified chronic kidney disease: Secondary | ICD-10-CM | POA: Diagnosis not present

## 2019-06-17 DIAGNOSIS — R112 Nausea with vomiting, unspecified: Secondary | ICD-10-CM | POA: Diagnosis not present

## 2019-06-17 DIAGNOSIS — Z89511 Acquired absence of right leg below knee: Secondary | ICD-10-CM | POA: Diagnosis not present

## 2019-06-17 DIAGNOSIS — N183 Chronic kidney disease, stage 3 unspecified: Secondary | ICD-10-CM | POA: Diagnosis not present

## 2019-06-17 DIAGNOSIS — E1151 Type 2 diabetes mellitus with diabetic peripheral angiopathy without gangrene: Secondary | ICD-10-CM | POA: Insufficient documentation

## 2019-06-17 DIAGNOSIS — Z87891 Personal history of nicotine dependence: Secondary | ICD-10-CM | POA: Insufficient documentation

## 2019-06-17 DIAGNOSIS — R1084 Generalized abdominal pain: Secondary | ICD-10-CM | POA: Insufficient documentation

## 2019-06-17 LAB — URINALYSIS, ROUTINE W REFLEX MICROSCOPIC
Bacteria, UA: NONE SEEN
Bilirubin Urine: NEGATIVE
Glucose, UA: >=500 mg/dL — AB
Ketones, ur: 5 mg/dL — AB
Leukocytes,Ua: NEGATIVE
Nitrite: NEGATIVE
Protein, ur: 100 mg/dL — AB
Specific Gravity, Urine: 1.02 (ref 1.005–1.030)
pH: 5 (ref 5.0–8.0)

## 2019-06-17 LAB — LIPASE, BLOOD: Lipase: 27 U/L (ref 11–51)

## 2019-06-17 LAB — CBC
HCT: 42.3 % (ref 39.0–52.0)
Hemoglobin: 14 g/dL (ref 13.0–17.0)
MCH: 30.8 pg (ref 26.0–34.0)
MCHC: 33.1 g/dL (ref 30.0–36.0)
MCV: 93 fL (ref 80.0–100.0)
Platelets: 218 10*3/uL (ref 150–400)
RBC: 4.55 MIL/uL (ref 4.22–5.81)
RDW: 13.4 % (ref 11.5–15.5)
WBC: 9.5 10*3/uL (ref 4.0–10.5)
nRBC: 0 % (ref 0.0–0.2)

## 2019-06-17 LAB — COMPREHENSIVE METABOLIC PANEL
ALT: 24 U/L (ref 0–44)
AST: 21 U/L (ref 15–41)
Albumin: 3.8 g/dL (ref 3.5–5.0)
Alkaline Phosphatase: 89 U/L (ref 38–126)
Anion gap: 8 (ref 5–15)
BUN: 21 mg/dL — ABNORMAL HIGH (ref 6–20)
CO2: 29 mmol/L (ref 22–32)
Calcium: 9 mg/dL (ref 8.9–10.3)
Chloride: 100 mmol/L (ref 98–111)
Creatinine, Ser: 1.93 mg/dL — ABNORMAL HIGH (ref 0.61–1.24)
GFR calc Af Amer: 48 mL/min — ABNORMAL LOW (ref >60)
GFR calc non Af Amer: 42 mL/min — ABNORMAL LOW (ref >60)
Glucose, Bld: 275 mg/dL — ABNORMAL HIGH (ref 70–99)
Potassium: 3.9 mmol/L (ref 3.5–5.1)
Sodium: 137 mmol/L (ref 135–145)
Total Bilirubin: 1 mg/dL (ref 0.3–1.2)
Total Protein: 8 g/dL (ref 6.5–8.1)

## 2019-06-17 MED ORDER — MORPHINE SULFATE (PF) 4 MG/ML IV SOLN
4.0000 mg | Freq: Once | INTRAVENOUS | Status: AC
  Administered 2019-06-17: 4 mg via INTRAVENOUS
  Filled 2019-06-17: qty 1

## 2019-06-17 MED ORDER — ONDANSETRON HCL 4 MG/2ML IJ SOLN
4.0000 mg | Freq: Once | INTRAMUSCULAR | Status: AC
  Administered 2019-06-17: 07:00:00 4 mg via INTRAVENOUS
  Filled 2019-06-17: qty 2

## 2019-06-17 MED ORDER — METOCLOPRAMIDE HCL 5 MG/ML IJ SOLN
10.0000 mg | Freq: Once | INTRAMUSCULAR | Status: AC
  Administered 2019-06-17: 10 mg via INTRAVENOUS
  Filled 2019-06-17: qty 2

## 2019-06-17 MED ORDER — SODIUM CHLORIDE 0.9 % IV SOLN: Freq: Once | INTRAVENOUS | Status: AC

## 2019-06-17 MED ORDER — DIPHENHYDRAMINE HCL 25 MG PO CAPS
25.0000 mg | ORAL_CAPSULE | Freq: Once | ORAL | Status: AC
  Administered 2019-06-17: 25 mg via ORAL
  Filled 2019-06-17: qty 1

## 2019-06-17 MED ORDER — SODIUM CHLORIDE 0.9 % IV BOLUS
1000.0000 mL | Freq: Once | INTRAVENOUS | Status: AC
  Administered 2019-06-17: 1000 mL via INTRAVENOUS

## 2019-06-17 MED ORDER — SODIUM CHLORIDE 0.9% FLUSH: 3.0000 mL | Freq: Once | INTRAVENOUS | Status: DC

## 2019-06-17 MED ORDER — ONDANSETRON HCL 4 MG PO TABS: 4.0000 mg | ORAL_TABLET | Freq: Three times a day (TID) | ORAL | 0 refills | Status: DC | PRN

## 2019-06-17 NOTE — ED Triage Notes (Signed)
Patient reports generalized abdominal pain with emesis onset 3 days ago , no fever or diarrhea .

## 2019-06-17 NOTE — ED Provider Notes (Signed)
Patient with history of diabetes comes here with abdominal pain that is generalized.  History of the same.  Suspect gastroparesis.  Lab work is unremarkable.  Vitals are normal.  Patient getting IV fluids, antiemetics and pain medicine and anticipate discharge to home.  On reevaluation patient feels improved.  No significant abdominal tenderness on exam.  Given prescription for Zofran and discharged in ED in good condition.  Recommend follow-up with primary care doctor.  This chart was dictated using voice recognition software.  Despite best efforts to proofread,  errors can occur which can change the documentation meaning.    Virgina Norfolk, DO 06/17/19 [id number]

## 2019-06-17 NOTE — ED Provider Notes (Signed)
[facility] EMERGENCY DEPARTMENT Provider Note   CSN: [id number] Arrival date & time: 06/17/19  6384   History Chief Complaint  Patient presents with  . Abdominal Pain    [name] is a 44 y.o. male.  The history is provided by the patient.  Abdominal Pain He has history of diabetes, hypertension, hyperlipidemia, and comes in because of nausea and vomiting and abdominal pain for the last 3 days.  Abdominal pain is generalized but worse on the right side.  He rates it at 10/10.  It is worse after emesis.  He denies fever or chills.  He has had similar episodes in the past.  Past Medical History:  Diagnosis Date  . Acquired contracture of Achilles tendon, right   . Acute osteomyelitis, ankle and foot 07/30/2010   Qualifier: Diagnosis of  By: Tommy Medal MD, Roderic Scarce    . Anemia   . Chronic kidney disease (CKD), stage III (moderate)   . Chronic osteomyelitis of right foot ([place])   . Collagen vascular disease ([place])   . DDD (degenerative disc disease), lumbar   . Dehiscence of amputation stump (H[facility])     dehiscence right transmetetarsal amputation achilles contracture  . Diabetic foot ulcer ([place]) 05/14/2017  . Diabetic foot ulcer with osteomyelitis ([place]) 05/12/2013  . Gastroparesis   . GERD (gastroesophageal reflux disease)   . Headache   . Hiatal hernia   . Hyperlipidemia   . Hypertension   . Pancreatitis   . Peripheral vascular disease ([place])   . Polysubstance abuse ([place]) 03/08/2016  . Renal insufficiency   . Status post transmetatarsal amputation of foot, right ([place]) 07/10/2016  . Type II diabetes mellitus ([place]) dx'd ~ 1996  . Vascular disease    poor circulation to left foot    Patient Active Problem List   Diagnosis Date Noted  . Hyperlipidemia 04/01/2018  . Hypertension 04/01/2018  . Chronic anemia   . Diabetes mellitus type 2 with complications, uncontrolled ([place])   . Diabetic peripheral neuropathy ([place])   . PVD (peripheral vascular disease) ([place])    . Hypokalemia   . Acute blood loss anemia   . Post-operative pain   . Unilateral complete BKA, right, subsequent encounter ([place])   . Diabetic polyneuropathy associated with type 2 diabetes mellitus ([place]) 07/10/2016  . Polysubstance abuse ([place]) 03/08/2016  . Tobacco abuse 02/27/2016  . Gastroparesis 05/28/2015  . GERD (gastroesophageal reflux disease) 10/01/2014  . Esophageal reflux   . Fever 09/27/2014  . Abdominal pain, lower 09/27/2014  . Abnormal ECG 05/30/2014  . Chest pain 05/30/2014  . Ankle pain 05/30/2014  . Pain in the chest   . Nausea 08/30/2013  . Epigastric abdominal pain 08/30/2013  . Low grade fever 08/30/2013  . Diabetic osteomyelitis b/l toes 03/23/2013  . DM (diabetes mellitus) type II uncontrolled, periph vascular disorder ([place]) 03/23/2013  . CKD (chronic kidney disease), stage III 03/23/2013  . Anemia 03/23/2013  . Benign essential HTN 03/23/2013  . AKI (acute kidney injury) ([place]) 03/22/2013  . Viral gastroenteritis 09/17/2012  . Nausea & vomiting 09/16/2012  . Acute pancreatitis 09/16/2012  . Diabetes mellitus ([place]) 09/20/2010  . Onychomycosis 09/20/2010  . METHICILLIN SUSCEPTIBLE STAPH AUREUS SEPTICEMIA 07/30/2010    Past Surgical History:  Procedure Laterality Date  . AMPUTATION  04/25/2011   Procedure: AMPUTATION DIGIT;  Surgeon: Newt Minion, MD;  Location: [facility];  Service: Orthopedics;  Laterality: Left;  Left foot 3rd toe amputation MTP joint, Gastroc Recession  Achilles Lengthening   .  AMPUTATION Bilateral 03/25/2013   Procedure: AMPUTATION RAY;  Surgeon: Newt Minion, MD;  Location: [facility];  Service: Orthopedics;  Laterality: Bilateral;  Left Great Toe Amputation at  MTP Joint, Right 1st and 2nd Ray Amputation   . AMPUTATION Right 10/03/2014   Procedure: AMPUTATION MIDFOOT;  Surgeon: Newt Minion, MD;  Location: [facility];  Service: Orthopedics;  Laterality: Right;  . AMPUTATION Right 07/14/2017   Procedure: RIGHT BELOW KNEE AMPUTATION;  Surgeon:  Newt Minion, MD;  Location: [facility];  Service: Orthopedics;  Laterality: Right;  . LAPAROSCOPIC CHOLECYSTECTOMY    . STUMP REVISION Right 05/23/2016   Procedure: Revision Right Transmetatarsal Amputation, Right Gastrocnemius Recession;  Surgeon: Newt Minion, MD;  Location: [facility];  Service: Orthopedics;  Laterality: Right;  . STUMP REVISION Right 05/15/2017   Procedure: REVISION RIGHT TRANSMETATARSAL AMPUTATION;  Surgeon: Newt Minion, MD;  Location: [facility];  Service: Orthopedics;  Laterality: Right;  . TOE AMPUTATION  2012   left foot; great toe and second toe       Family History  Problem Relation Age of Onset  . Heart attack Father 65  . Hypertension Sister     Social History   Tobacco Use  . Smoking status: Former Smoker    Packs/day: 0.10    Years: 4.00    Pack years: 0.40    Types: Cigarettes    Quit date: 06/10/2015    Years since quitting: 4.0  . Smokeless tobacco: Never Used  Substance Use Topics  . Alcohol use: No  . Drug use: Yes    Frequency: 10.0 times per week    Types: Marijuana    Home Medications Prior to Admission medications   Medication Sig Start Date End Date Taking? Authorizing Provider  amLODipine (NORVASC) 10 MG tablet Take 10 mg by mouth daily. 04/07/18   [provider]  amLODipine (NORVASC) 5 MG tablet Take 1 tablet (5 mg total) by mouth daily. 07/17/17   Nita Sells, MD  atorvastatin (LIPITOR) 10 MG tablet Take 10 mg by mouth daily. 03/29/17   [provider]  blood glucose meter kit and supplies KIT Dispense based on patient and insurance preference. Use up to four times daily as directed. (FOR ICD-9 250.00, 250.01). 05/16/17   Rai, Vernelle Emerald, MD  dicyclomine (BENTYL) 20 MG tablet Take 1 tablet (20 mg total) by mouth 2 (two) times daily. 02/26/19   Fawze, Mina A, PA-C  famotidine (PEPCID) 20 MG tablet Take 1 tablet (20 mg total) by mouth 2 (two) times daily. Patient taking differently: Take 20 mg by mouth 2 (two) times  daily as needed for heartburn or indigestion.  04/12/18   Robinson, [place] N, PA-C  hydroxypropyl methylcellulose / hypromellose (ISOPTO TEARS / GONIOVISC) 2.5 % ophthalmic solution Place 1 drop into both eyes as needed for dry eyes.    [provider]  ibuprofen (ADVIL) 800 MG tablet Take 1 tablet (800 mg total) by mouth every 6 (six) hours as needed for moderate pain. 04/23/19   Orpah Greek, MD  insulin aspart (NOVOLOG) 100 UNIT/ML injection Inject 8 Units into the skin 3 (three) times daily with meals. Patient taking differently: Inject 3 Units into the skin 3 (three) times daily with meals.  07/17/17   Nita Sells, MD  insulin glargine (LANTUS) 100 UNIT/ML injection Inject 0.05 mLs (5 Units total) into the skin daily. Patient taking differently: Inject 8 Units into the skin daily.  07/17/17   Nita Sells, MD  Insulin Syringes, Disposable, U-100 0.5 ML MISC Use with lantus and novolog vials. 05/16/17   Rai, Vernelle Emerald, MD  methocarbamol (ROBAXIN) 500 MG tablet Take 1 tablet (500 mg total) by mouth every 8 (eight) hours as needed for muscle spasms. 04/23/19   Orpah Greek, MD  metoCLOPramide (REGLAN) 10 MG tablet Take 1 tablet (10 mg total) by mouth every 8 (eight) hours as needed for nausea or vomiting. 02/28/19   Sherwood Gambler, MD  omeprazole (PRILOSEC OTC) 20 MG tablet Take 20 mg by mouth daily as needed (indigestion).     [provider]  ondansetron (ZOFRAN ODT) 4 MG disintegrating tablet Take 1 tablet (4 mg total) by mouth every 8 (eight) hours as needed for nausea or vomiting. 02/26/19   Nils Flack, Mina A, PA-C  sucralfate (CARAFATE) 1 g tablet Take 1 tablet (1 g total) by mouth 4 (four) times daily -  with meals and at bedtime. 04/12/18   Robinson, [place] N, PA-C  pantoprazole (PROTONIX) 40 MG tablet Take 1 tablet (40 mg total) by mouth 2 (two) times daily. Patient not taking: Reported on 10/01/2014 09/30/14 05/26/15  Barton Dubois, MD     Allergies    Patient has no known allergies.  Review of Systems   Review of Systems  Gastrointestinal: Positive for abdominal pain.  All other systems reviewed and are negative.   Physical Exam Updated Vital Signs BP (!) 147/103   Pulse 79   Temp 98.3 F (36.8 C) (Oral)   Resp 17   SpO2 100%   Physical Exam Vitals and nursing note reviewed.   44 year old male, resting comfortably and in no acute distress. Vital signs are significant for elevated blood pressure. Oxygen saturation is 100%, which is normal. Head is normocephalic and atraumatic. PERRLA, EOMI. Oropharynx is clear. Neck is nontender and supple without adenopathy or JVD. Back is nontender and there is no CVA tenderness. Lungs are clear without rales, wheezes, or rhonchi. Chest is nontender. Heart has regular rate and rhythm without murmur. Abdomen is soft, flat, with mild tenderness diffusely.  There is no rebound or guarding.  There are no masses or hepatosplenomegaly and peristalsis is hypoactive. Extremities have no cyanosis or edema, full range of motion is present. Right below the knee amputation. Skin is warm and dry without rash. Neurologic: Mental status is normal, cranial nerves are intact, there are no motor or sensory deficits.  ED Results / Procedures / Treatments   Labs (all labs ordered are listed, but only abnormal results are displayed) Labs Reviewed  COMPREHENSIVE METABOLIC PANEL - Abnormal; Notable for the following components:      Result Value   Glucose, Bld 275 (*)    BUN 21 (*)    Creatinine, Ser 1.93 (*)    GFR calc non Af Amer 42 (*)    GFR calc Af Amer 48 (*)    All other components within normal limits  URINALYSIS, ROUTINE W REFLEX MICROSCOPIC - Abnormal; Notable for the following components:   Glucose, UA >=500 (*)    Hgb urine dipstick SMALL (*)    Ketones, ur 5 (*)    Protein, ur 100 (*)    All other components within normal limits  LIPASE, BLOOD  CBC    Procedures Procedures  Medications Ordered in ED Medications  sodium chloride flush (NS) 0.9 % injection 3 mL (has no administration in time range)  sodium chloride 0.9 % bolus 1,000 mL (has no administration in time range)  ondansetron (  ZOFRAN) injection 4 mg (has no administration in time range)  morphine 4 MG/ML injection 4 mg (has no administration in time range)  0.9 %  sodium chloride infusion (has no administration in time range)    ED Course  I have reviewed the triage vital signs and the nursing notes.  Pertinent lab results that were available during my care of the patient were reviewed by me and considered in my medical decision making (see chart for details).  MDM Rules/Calculators/A&P Abdominal pain and vomiting most consistent with diabetic gastroparesis.  Old records are reviewed, and he has been diagnosed with gastroparesis in the past.  He has had 5 CT scans of abdomen and pelvis over the last 2 years.  I do not see indication for repeat imaging today.  Labs are reassuring.  He has renal insufficiency which is unchanged from baseline.  WBC is normal.  Urinalysis does have ketones which would be expected with vomiting, but anion gap is normal-no evidence of ketoacidosis.  He will be given IV fluids, morphine, ondansetron and reassessed.  Case to signed out to Dr. Ronnald Nian.  Final Clinical Impression(s) / ED Diagnoses Final diagnoses:  Non-intractable vomiting with nausea, unspecified vomiting type  Generalized abdominal pain  Renal insufficiency    Rx / DC Orders ED Discharge Orders    None       Delora Fuel, MD 47/34/03 [id number]

## 2019-07-05 ENCOUNTER — Encounter: Payer: Self-pay | Admitting: Family Medicine

## 2019-07-05 ENCOUNTER — Other Ambulatory Visit: Payer: Self-pay

## 2019-07-05 ENCOUNTER — Ambulatory Visit (INDEPENDENT_AMBULATORY_CARE_PROVIDER_SITE_OTHER): Payer: Medicare (Managed Care) | Admitting: Family Medicine

## 2019-07-05 VITALS — BP 144/77 | HR 81 | Temp 98.1°F | Ht 73.0 in | Wt 240.2 lb

## 2019-07-05 DIAGNOSIS — E118 Type 2 diabetes mellitus with unspecified complications: Secondary | ICD-10-CM | POA: Diagnosis not present

## 2019-07-05 DIAGNOSIS — E1151 Type 2 diabetes mellitus with diabetic peripheral angiopathy without gangrene: Secondary | ICD-10-CM | POA: Diagnosis not present

## 2019-07-05 DIAGNOSIS — E1165 Type 2 diabetes mellitus with hyperglycemia: Secondary | ICD-10-CM

## 2019-07-05 DIAGNOSIS — Z7689 Persons encountering health services in other specified circumstances: Secondary | ICD-10-CM | POA: Diagnosis not present

## 2019-07-05 DIAGNOSIS — Z89511 Acquired absence of right leg below knee: Secondary | ICD-10-CM

## 2019-07-05 DIAGNOSIS — Z09 Encounter for follow-up examination after completed treatment for conditions other than malignant neoplasm: Secondary | ICD-10-CM | POA: Diagnosis not present

## 2019-07-05 DIAGNOSIS — R112 Nausea with vomiting, unspecified: Secondary | ICD-10-CM

## 2019-07-05 DIAGNOSIS — IMO0002 Reserved for concepts with insufficient information to code with codable children: Secondary | ICD-10-CM

## 2019-07-05 LAB — POCT URINALYSIS DIPSTICK
Bilirubin, UA: NEGATIVE
Glucose, UA: NEGATIVE
Ketones, UA: NEGATIVE
Leukocytes, UA: NEGATIVE
Nitrite, UA: NEGATIVE
Protein, UA: POSITIVE — AB
Spec Grav, UA: >=1.03 — AB (ref 1.010–1.025)
Urobilinogen, UA: 0.2 E.U./dL
pH, UA: 5.5 (ref 5.0–8.0)

## 2019-07-05 LAB — GLUCOSE, POCT (MANUAL RESULT ENTRY): POC Glucose: 175 mg/dl — AB (ref 70–99)

## 2019-07-05 LAB — POCT GLYCOSYLATED HEMOGLOBIN (HGB A1C): Hemoglobin A1C: 7.9 % — AB (ref 4.0–5.6)
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
## 2019-07-05 NOTE — Patient Instructions (Signed)
DASH Eating Plan DASH stands for "Dietary Approaches to Stop Hypertension." The DASH eating plan is a healthy eating plan that has been shown to reduce high blood pressure (hypertension). It may also reduce your risk for type 2 diabetes, heart disease, and stroke. The DASH eating plan may also help with weight loss. What are tips for following this plan?  General guidelines  Avoid eating more than 2,300 mg (milligrams) of salt (sodium) a day. If you have hypertension, you may need to reduce your sodium intake to 1,500 mg a day.  Limit alcohol intake to no more than 1 drink a day for nonpregnant women and 2 drinks a day for men. One drink equals 12 oz of beer, 5 oz of wine, or 1 oz of hard liquor.  Work with your health care provider to maintain a healthy body weight or to lose weight. Ask what an ideal weight is for you.  Get at least 30 minutes of exercise that causes your heart to beat faster (aerobic exercise) most days of the week. Activities may include walking, swimming, or biking.  Work with your health care provider or diet and nutrition specialist (dietitian) to adjust your eating plan to your individual calorie needs. Reading food labels   Check food labels for the amount of sodium per serving. Choose foods with less than 5 percent of the Daily Value of sodium. Generally, foods with less than 300 mg of sodium per serving fit into this eating plan.  To find whole grains, look for the word "whole" as the first word in the ingredient list. Shopping  Buy products labeled as "low-sodium" or "no salt added."  Buy fresh foods. Avoid canned foods and premade or frozen meals. Cooking  Avoid adding salt when cooking. Use salt-free seasonings or herbs instead of table salt or sea salt. Check with your health care provider or pharmacist before using salt substitutes.  Do not fry foods. Cook foods using healthy methods such as baking, boiling, grilling, and broiling instead.  Cook with  heart-healthy oils, such as olive, canola, soybean, or sunflower oil. Meal planning  Eat a balanced diet that includes: ? 5 or more servings of fruits and vegetables each day. At each meal, try to fill half of your plate with fruits and vegetables. ? Up to 6-8 servings of whole grains each day. ? Less than 6 oz of lean meat, poultry, or fish each day. A 3-oz serving of meat is about the same size as a deck of cards. One egg equals 1 oz. ? 2 servings of low-fat dairy each day. ? A serving of nuts, seeds, or beans 5 times each week. ? Heart-healthy fats. Healthy fats called Omega-3 fatty acids are found in foods such as flaxseeds and coldwater fish, like sardines, salmon, and mackerel.  Limit how much you eat of the following: ? Canned or prepackaged foods. ? Food that is high in trans fat, such as fried foods. ? Food that is high in saturated fat, such as fatty meat. ? Sweets, desserts, sugary drinks, and other foods with added sugar. ? Full-fat dairy products.  Do not salt foods before eating.  Try to eat at least 2 vegetarian meals each week.  Eat more home-cooked food and less restaurant, buffet, and fast food.  When eating at a restaurant, ask that your food be prepared with less salt or no salt, if possible. What foods are recommended? The items listed may not be a complete list. Talk with your dietitian about   what dietary choices are best for you. Grains Whole-grain or whole-wheat bread. Whole-grain or whole-wheat pasta. Brown rice. Oatmeal. Quinoa. Bulgur. Whole-grain and low-sodium cereals. Pita bread. Low-fat, low-sodium crackers. Whole-wheat flour tortillas. Vegetables Fresh or frozen vegetables (raw, steamed, roasted, or grilled). Low-sodium or reduced-sodium tomato and vegetable juice. Low-sodium or reduced-sodium tomato sauce and tomato paste. Low-sodium or reduced-sodium canned vegetables. Fruits All fresh, dried, or frozen fruit. Canned fruit in natural juice (without  added sugar). Meat and other protein foods Skinless chicken or turkey. Ground chicken or turkey. Pork with fat trimmed off. Fish and seafood. Egg whites. Dried beans, peas, or lentils. Unsalted nuts, nut butters, and seeds. Unsalted canned beans. Lean cuts of beef with fat trimmed off. Low-sodium, lean deli meat. Dairy Low-fat (1%) or fat-free (skim) milk. Fat-free, low-fat, or reduced-fat cheeses. Nonfat, low-sodium ricotta or cottage cheese. Low-fat or nonfat yogurt. Low-fat, low-sodium cheese. Fats and oils Soft margarine without trans fats. Vegetable oil. Low-fat, reduced-fat, or light mayonnaise and salad dressings (reduced-sodium). Canola, safflower, olive, soybean, and sunflower oils. Avocado. Seasoning and other foods Herbs. Spices. Seasoning mixes without salt. Unsalted popcorn and pretzels. Fat-free sweets. What foods are not recommended? The items listed may not be a complete list. Talk with your dietitian about what dietary choices are best for you. Grains Baked goods made with fat, such as croissants, muffins, or some breads. Dry pasta or rice meal packs. Vegetables Creamed or fried vegetables. Vegetables in a cheese sauce. Regular canned vegetables (not low-sodium or reduced-sodium). Regular canned tomato sauce and paste (not low-sodium or reduced-sodium). Regular tomato and vegetable juice (not low-sodium or reduced-sodium). Pickles. Olives. Fruits Canned fruit in a light or heavy syrup. Fried fruit. Fruit in cream or butter sauce. Meat and other protein foods Fatty cuts of meat. Ribs. Fried meat. Bacon. Sausage. Bologna and other processed lunch meats. Salami. Fatback. Hotdogs. Bratwurst. Salted nuts and seeds. Canned beans with added salt. Canned or smoked fish. Whole eggs or egg yolks. Chicken or turkey with skin. Dairy Whole or 2% milk, cream, and half-and-half. Whole or full-fat cream cheese. Whole-fat or sweetened yogurt. Full-fat cheese. Nondairy creamers. Whipped toppings.  Processed cheese and cheese spreads. Fats and oils Butter. Stick margarine. Lard. Shortening. Ghee. Bacon fat. Tropical oils, such as coconut, palm kernel, or palm oil. Seasoning and other foods Salted popcorn and pretzels. Onion salt, garlic salt, seasoned salt, table salt, and sea salt. Worcestershire sauce. Tartar sauce. Barbecue sauce. Teriyaki sauce. Soy sauce, including reduced-sodium. Steak sauce. Canned and packaged gravies. Fish sauce. Oyster sauce. Cocktail sauce. Horseradish that you find on the shelf. Ketchup. Mustard. Meat flavorings and tenderizers. Bouillon cubes. Hot sauce and Tabasco sauce. Premade or packaged marinades. Premade or packaged taco seasonings. Relishes. Regular salad dressings. Where to find more information:  National Heart, Lung, and Blood Institute: www.nhlbi.nih.gov  American Heart Association: www.heart.org Summary  The DASH eating plan is a healthy eating plan that has been shown to reduce high blood pressure (hypertension). It may also reduce your risk for type 2 diabetes, heart disease, and stroke.  With the DASH eating plan, you should limit salt (sodium) intake to 2,300 mg a day. If you have hypertension, you may need to reduce your sodium intake to 1,500 mg a day.  When on the DASH eating plan, aim to eat more fresh fruits and vegetables, whole grains, lean proteins, low-fat dairy, and heart-healthy fats.  Work with your health care provider or diet and nutrition specialist (dietitian) to adjust your eating plan to your   individual calorie needs. This information is not intended to replace advice given to you by your health care provider. Make sure you discuss any questions you have with your health care provider. Document Revised: 05/08/2017 Document Reviewed: 05/19/2016 Elsevier Patient Education  2020 Elsevier Inc.  

## 2019-07-06 LAB — LIPID PANEL
Chol/HDL Ratio: 4.7 ratio (ref 0.0–5.0)
Cholesterol, Total: 198 mg/dL (ref 100–199)
HDL: 42 mg/dL (ref >39)
LDL Chol Calc (NIH): 121 mg/dL — ABNORMAL HIGH (ref 0–99)
Triglycerides: 201 mg/dL — ABNORMAL HIGH (ref 0–149)
VLDL Cholesterol Cal: 35 mg/dL (ref 5–40)

## 2019-07-06 LAB — CBC WITH DIFFERENTIAL/PLATELET
Basophils Absolute: 0 10*3/uL (ref 0.0–0.2)
Basos: 1 %
EOS (ABSOLUTE): 0.3 10*3/uL (ref 0.0–0.4)
Eos: 5 %
Hematocrit: 42.3 % (ref 37.5–51.0)
Hemoglobin: 13.8 g/dL (ref 13.0–17.7)
Immature Grans (Abs): 0 10*3/uL (ref 0.0–0.1)
Immature Granulocytes: 0 %
Lymphocytes Absolute: 1.6 10*3/uL (ref 0.7–3.1)
Lymphs: 24 %
MCH: 29.9 pg (ref 26.6–33.0)
MCHC: 32.6 g/dL (ref 31.5–35.7)
MCV: 92 fL (ref 79–97)
Monocytes Absolute: 0.3 10*3/uL (ref 0.1–0.9)
Monocytes: 5 %
Neutrophils Absolute: 4.1 10*3/uL (ref 1.4–7.0)
Neutrophils: 65 %
Platelets: 245 10*3/uL (ref 150–450)
RBC: 4.61 x10E6/uL (ref 4.14–5.80)
RDW: 13.8 % (ref 11.6–15.4)
WBC: 6.4 10*3/uL (ref 3.4–10.8)

## 2019-07-06 LAB — COMPREHENSIVE METABOLIC PANEL
ALT: 17 IU/L (ref 0–44)
AST: 16 IU/L (ref 0–40)
Albumin/Globulin Ratio: 1.3 (ref 1.2–2.2)
Albumin: 4.3 g/dL (ref 4.0–5.0)
Alkaline Phosphatase: 115 IU/L (ref 39–117)
BUN/Creatinine Ratio: 10 (ref 9–20)
BUN: 17 mg/dL (ref 6–24)
Bilirubin Total: 0.2 mg/dL (ref 0.0–1.2)
CO2: 25 mmol/L (ref 20–29)
Calcium: 9.6 mg/dL (ref 8.7–10.2)
Chloride: 103 mmol/L (ref 96–106)
Creatinine, Ser: 1.74 mg/dL — ABNORMAL HIGH (ref 0.76–1.27)
GFR calc Af Amer: 55 mL/min/{1.73_m2} — ABNORMAL LOW (ref >59)
GFR calc non Af Amer: 47 mL/min/{1.73_m2} — ABNORMAL LOW (ref >59)
Globulin, Total: 3.2 g/dL (ref 1.5–4.5)
Glucose: 133 mg/dL — ABNORMAL HIGH (ref 65–99)
Potassium: 4.8 mmol/L (ref 3.5–5.2)
Sodium: 140 mmol/L (ref 134–144)
Total Protein: 7.5 g/dL (ref 6.0–8.5)

## 2019-07-06 LAB — TSH: TSH: 0.382 u[IU]/mL — ABNORMAL LOW (ref 0.450–4.500)

## 2019-07-06 LAB — VITAMIN D 25 HYDROXY (VIT D DEFICIENCY, FRACTURES): Vit D, 25-Hydroxy: 8.2 ng/mL — ABNORMAL LOW (ref 30.0–100.0)

## 2019-07-06 LAB — VITAMIN B12: Vitamin B-12: 382 pg/mL (ref 232–1245)

## 2019-07-11 DIAGNOSIS — E559 Vitamin D deficiency, unspecified: Secondary | ICD-10-CM

## 2019-07-11 HISTORY — DX: Vitamin D deficiency, unspecified: E55.9

## 2019-07-12 ENCOUNTER — Ambulatory Visit: Payer: Medicare (Managed Care) | Admitting: Gastroenterology

## 2019-07-18 ENCOUNTER — Encounter: Payer: Self-pay | Admitting: Family Medicine

## 2019-07-18 ENCOUNTER — Other Ambulatory Visit: Payer: Self-pay | Admitting: Family Medicine

## 2019-07-18 DIAGNOSIS — E559 Vitamin D deficiency, unspecified: Secondary | ICD-10-CM

## 2019-07-18 MED ORDER — VITAMIN D (ERGOCALCIFEROL) 1.25 MG (50000 UNIT) PO CAPS: 50000.0000 [IU] | ORAL_CAPSULE | ORAL | 6 refills | Status: DC

## 2019-08-20 ENCOUNTER — Emergency Department (HOSPITAL_COMMUNITY)
Admission: EM | Admit: 2019-08-20 | Discharge: 2019-08-20 | Disposition: A | Discharge status: Home / Self Care | Payer: Medicare (Managed Care) | Attending: Emergency Medicine | Admitting: Emergency Medicine

## 2019-08-20 ENCOUNTER — Encounter (HOSPITAL_COMMUNITY): Payer: Self-pay | Admitting: Emergency Medicine

## 2019-08-20 DIAGNOSIS — Z79899 Other long term (current) drug therapy: Secondary | ICD-10-CM | POA: Diagnosis not present

## 2019-08-20 DIAGNOSIS — F17210 Nicotine dependence, cigarettes, uncomplicated: Secondary | ICD-10-CM | POA: Diagnosis not present

## 2019-08-20 DIAGNOSIS — Z89422 Acquired absence of other left toe(s): Secondary | ICD-10-CM | POA: Insufficient documentation

## 2019-08-20 DIAGNOSIS — R1012 Left upper quadrant pain: Secondary | ICD-10-CM | POA: Diagnosis present

## 2019-08-20 DIAGNOSIS — Z89412 Acquired absence of left great toe: Secondary | ICD-10-CM | POA: Insufficient documentation

## 2019-08-20 DIAGNOSIS — I129 Hypertensive chronic kidney disease with stage 1 through stage 4 chronic kidney disease, or unspecified chronic kidney disease: Secondary | ICD-10-CM | POA: Insufficient documentation

## 2019-08-20 DIAGNOSIS — N183 Chronic kidney disease, stage 3 unspecified: Secondary | ICD-10-CM | POA: Insufficient documentation

## 2019-08-20 DIAGNOSIS — Z794 Long term (current) use of insulin: Secondary | ICD-10-CM | POA: Insufficient documentation

## 2019-08-20 DIAGNOSIS — K3184 Gastroparesis: Secondary | ICD-10-CM | POA: Diagnosis not present

## 2019-08-20 DIAGNOSIS — R112 Nausea with vomiting, unspecified: Secondary | ICD-10-CM

## 2019-08-20 DIAGNOSIS — E1122 Type 2 diabetes mellitus with diabetic chronic kidney disease: Secondary | ICD-10-CM | POA: Diagnosis not present

## 2019-08-20 DIAGNOSIS — I10 Essential (primary) hypertension: Secondary | ICD-10-CM

## 2019-08-20 LAB — URINALYSIS, ROUTINE W REFLEX MICROSCOPIC
Bacteria, UA: NONE SEEN
Bilirubin Urine: NEGATIVE
Glucose, UA: 150 mg/dL — AB
Hgb urine dipstick: NEGATIVE
Ketones, ur: NEGATIVE mg/dL
Leukocytes,Ua: NEGATIVE
Nitrite: NEGATIVE
Protein, ur: 100 mg/dL — AB
Specific Gravity, Urine: 1.014 (ref 1.005–1.030)
pH: 8 (ref 5.0–8.0)

## 2019-08-20 LAB — COMPREHENSIVE METABOLIC PANEL
ALT: 23 U/L (ref 0–44)
AST: 27 U/L (ref 15–41)
Albumin: 3.9 g/dL (ref 3.5–5.0)
Alkaline Phosphatase: 87 U/L (ref 38–126)
Anion gap: 12 (ref 5–15)
BUN: 15 mg/dL (ref 6–20)
CO2: 24 mmol/L (ref 22–32)
Calcium: 9.1 mg/dL (ref 8.9–10.3)
Chloride: 102 mmol/L (ref 98–111)
Creatinine, Ser: 1.71 mg/dL — ABNORMAL HIGH (ref 0.61–1.24)
GFR calc Af Amer: 56 mL/min — ABNORMAL LOW (ref >60)
GFR calc non Af Amer: 48 mL/min — ABNORMAL LOW (ref >60)
Glucose, Bld: 206 mg/dL — ABNORMAL HIGH (ref 70–99)
Potassium: 3.8 mmol/L (ref 3.5–5.1)
Sodium: 138 mmol/L (ref 135–145)
Total Bilirubin: 0.5 mg/dL (ref 0.3–1.2)
Total Protein: 7.6 g/dL (ref 6.5–8.1)

## 2019-08-20 LAB — CBC
HCT: 43.8 % (ref 39.0–52.0)
Hemoglobin: 14.3 g/dL (ref 13.0–17.0)
MCH: 30.6 pg (ref 26.0–34.0)
MCHC: 32.6 g/dL (ref 30.0–36.0)
MCV: 93.8 fL (ref 80.0–100.0)
Platelets: 198 10*3/uL (ref 150–400)
RBC: 4.67 MIL/uL (ref 4.22–5.81)
RDW: 13.4 % (ref 11.5–15.5)
WBC: 8.7 10*3/uL (ref 4.0–10.5)
nRBC: 0 % (ref 0.0–0.2)

## 2019-08-20 LAB — LIPASE, BLOOD: Lipase: 32 U/L (ref 11–51)

## 2019-08-20 MED ORDER — SODIUM CHLORIDE 0.9% FLUSH
3.0000 mL | Freq: Once | INTRAVENOUS | Status: AC
  Administered 2019-08-20: 3 mL via INTRAVENOUS

## 2019-08-20 MED ORDER — ONDANSETRON HCL 4 MG/2ML IJ SOLN
4.0000 mg | Freq: Once | INTRAMUSCULAR | Status: AC
  Administered 2019-08-20: 4 mg via INTRAVENOUS
  Filled 2019-08-20: qty 2

## 2019-08-20 MED ORDER — PANTOPRAZOLE SODIUM 40 MG IV SOLR
40.0000 mg | Freq: Once | INTRAVENOUS | Status: AC
  Administered 2019-08-20: 40 mg via INTRAVENOUS
  Filled 2019-08-20: qty 40

## 2019-08-20 MED ORDER — LIDOCAINE VISCOUS HCL 2 % MT SOLN
15.0000 mL | Freq: Once | OROMUCOSAL | Status: AC
  Administered 2019-08-20: 15 mL via ORAL
  Filled 2019-08-20: qty 15

## 2019-08-20 MED ORDER — ALUM & MAG HYDROXIDE-SIMETH 200-200-20 MG/5ML PO SUSP
30.0000 mL | Freq: Once | ORAL | Status: AC
  Administered 2019-08-20: 30 mL via ORAL
  Filled 2019-08-20: qty 30

## 2019-08-20 MED ORDER — AMLODIPINE BESYLATE 10 MG PO TABS: 10.0000 mg | ORAL_TABLET | Freq: Every day | ORAL | 0 refills | Status: DC

## 2019-08-20 MED ORDER — AMLODIPINE BESYLATE 5 MG PO TABS
10.0000 mg | ORAL_TABLET | Freq: Once | ORAL | Status: AC
  Administered 2019-08-20: 10 mg via ORAL
  Filled 2019-08-20: qty 2

## 2019-08-20 MED ORDER — SODIUM CHLORIDE 0.9 % IV BOLUS (SEPSIS)
1000.0000 mL | Freq: Once | INTRAVENOUS | Status: AC
  Administered 2019-08-20: 09:00:00 1000 mL via INTRAVENOUS

## 2019-08-20 MED ORDER — METOCLOPRAMIDE HCL 10 MG PO TABS: 10.0000 mg | ORAL_TABLET | Freq: Three times a day (TID) | ORAL | 0 refills | Status: DC | PRN

## 2019-08-20 MED ORDER — MORPHINE SULFATE (PF) 4 MG/ML IV SOLN
4.0000 mg | Freq: Once | INTRAVENOUS | Status: AC
  Administered 2019-08-20: 09:00:00 4 mg via INTRAVENOUS
  Filled 2019-08-20: qty 1

## 2019-08-20 NOTE — ED Notes (Signed)
Pt states emesis and abd pain (LUQ) x 2 days; denies diarrhea; denies sick contacts; denies urinary or bowel issues, last BM this am; pt took tylenol at home approx 6 hours ago; denies fevers

## 2019-08-20 NOTE — ED Notes (Signed)
Patient verbalizes understanding of discharge instructions. Opportunity for questioning and answers were provided. Pt discharged from ED. 

## 2019-08-20 NOTE — Discharge Instructions (Addendum)
You were seen today for abdominal pain. We think this may be from gastroparesis related to your diabetes. Your labs were reassuring. We gave you fluids and medications through your IV. We have referred you to a GI specialist. Please call them for an appointment. We have also prescribed you a medication which may help your symptoms. Your blood pressure was also elevated. We have refilled your blood pressure medications. Please be sure to follow up with your primary care provider for this. Thank you for allowing me to care for you today. Please return to the emergency department if you have new or worsening symptoms. Take your medications as instructed.
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
## 2019-09-09 ENCOUNTER — Ambulatory Visit: Payer: Medicare (Managed Care) | Admitting: Family Medicine

## 2019-09-13 ENCOUNTER — Ambulatory Visit: Payer: Medicare (Managed Care) | Admitting: Family Medicine

## 2019-09-20 ENCOUNTER — Other Ambulatory Visit: Payer: Self-pay

## 2019-09-20 ENCOUNTER — Encounter: Payer: Self-pay | Admitting: Family Medicine

## 2019-09-20 ENCOUNTER — Ambulatory Visit (INDEPENDENT_AMBULATORY_CARE_PROVIDER_SITE_OTHER): Payer: Medicare (Managed Care) | Admitting: Family Medicine

## 2019-09-20 VITALS — BP 130/85 | HR 66 | Temp 98.0°F | Ht 73.0 in | Wt 237.6 lb

## 2019-09-20 DIAGNOSIS — R7303 Prediabetes: Secondary | ICD-10-CM | POA: Diagnosis not present

## 2019-09-20 DIAGNOSIS — I10 Essential (primary) hypertension: Secondary | ICD-10-CM

## 2019-09-20 DIAGNOSIS — E1151 Type 2 diabetes mellitus with diabetic peripheral angiopathy without gangrene: Secondary | ICD-10-CM

## 2019-09-20 DIAGNOSIS — R110 Nausea: Secondary | ICD-10-CM

## 2019-09-20 DIAGNOSIS — E119 Type 2 diabetes mellitus without complications: Secondary | ICD-10-CM

## 2019-09-20 DIAGNOSIS — E1165 Type 2 diabetes mellitus with hyperglycemia: Secondary | ICD-10-CM | POA: Diagnosis not present

## 2019-09-20 DIAGNOSIS — Z89511 Acquired absence of right leg below knee: Secondary | ICD-10-CM | POA: Diagnosis not present

## 2019-09-20 DIAGNOSIS — IMO0002 Reserved for concepts with insufficient information to code with codable children: Secondary | ICD-10-CM

## 2019-09-20 DIAGNOSIS — R739 Hyperglycemia, unspecified: Secondary | ICD-10-CM | POA: Diagnosis not present

## 2019-09-20 DIAGNOSIS — Z09 Encounter for follow-up examination after completed treatment for conditions other than malignant neoplasm: Secondary | ICD-10-CM

## 2019-09-20 LAB — POCT GLYCOSYLATED HEMOGLOBIN (HGB A1C): Hemoglobin A1C: 7.2 % — AB (ref 4.0–5.6)

## 2019-09-20 LAB — POCT URINALYSIS DIPSTICK
Bilirubin, UA: NEGATIVE
Blood, UA: NEGATIVE
Glucose, UA: NEGATIVE
Ketones, UA: NEGATIVE
Leukocytes, UA: NEGATIVE
Nitrite, UA: NEGATIVE
Protein, UA: POSITIVE — AB
Spec Grav, UA: >=1.03 — AB (ref 1.010–1.025)
Urobilinogen, UA: 0.2 E.U./dL
pH, UA: 7 (ref 5.0–8.0)

## 2019-09-20 LAB — GLUCOSE, POCT (MANUAL RESULT ENTRY): POC Glucose: 149 mg/dl — AB (ref 70–99)

## 2019-09-20 MED ORDER — ATORVASTATIN CALCIUM 10 MG PO TABS: 10.0000 mg | ORAL_TABLET | Freq: Every day | ORAL | 6 refills | Status: DC

## 2019-09-20 MED ORDER — AMLODIPINE BESYLATE 10 MG PO TABS: 10.0000 mg | ORAL_TABLET | Freq: Every day | ORAL | 6 refills | Status: DC

## 2019-09-20 MED ORDER — ONDANSETRON 4 MG PO TBDP: 4.0000 mg | ORAL_TABLET | Freq: Three times a day (TID) | ORAL | 1 refills | Status: DC | PRN

## 2019-09-20 NOTE — Patient Instructions (Signed)
Ondansetron oral dissolving tablet What is this medicine? ONDANSETRON (on DAN se tron) is used to treat nausea and vomiting caused by chemotherapy. It is also used to prevent or treat nausea and vomiting after surgery. This medicine may be used for other purposes; ask your health care provider or pharmacist if you have questions. COMMON BRAND NAME(S): Zofran ODT What should I tell my health care provider before I take this medicine? They need to know if you have any of these conditions:  heart disease  history of irregular heartbeat  liver disease  low levels of magnesium or potassium in the blood  an unusual or allergic reaction to ondansetron, granisetron, other medicines, foods, dyes, or preservatives  pregnant or trying to get pregnant  breast-feeding How should I use this medicine? These tablets are made to dissolve in the mouth. Do not try to push the tablet through the foil backing. With dry hands, peel away the foil backing and gently remove the tablet. Place the tablet in the mouth and allow it to dissolve, then swallow. While you may take these tablets with water, it is not necessary to do so. Talk to your pediatrician regarding the use of this medicine in children. Special care may be needed. Overdosage: If you think you have taken too much of this medicine contact a poison control center or emergency room at once. NOTE: This medicine is only for you. Do not share this medicine with others. What if I miss a dose? If you miss a dose, take it as soon as you can. If it is almost time for your next dose, take only that dose. Do not take double or extra doses. What may interact with this medicine? Do not take this medicine with any of the following medications:  apomorphine  certain medicines for fungal infections like fluconazole, itraconazole, ketoconazole, posaconazole, voriconazole  cisapride  dronedarone  pimozide  thioridazine This medicine may also interact with  the following medications:  carbamazepine  certain medicines for depression, anxiety, or psychotic disturbances  fentanyl  linezolid  MAOIs like Carbex, Eldepryl, Marplan, Nardil, and Parnate  methylene blue (injected into a vein)  other medicines that prolong the QT interval (cause an abnormal heart rhythm) like dofetilide, ziprasidone  phenytoin  rifampicin  tramadol This list may not describe all possible interactions. Give your health care provider a list of all the medicines, herbs, non-prescription drugs, or dietary supplements you use. Also tell them if you smoke, drink alcohol, or use illegal drugs. Some items may interact with your medicine. What should I watch for while using this medicine? Check with your doctor or health care professional as soon as you can if you have any sign of an allergic reaction. What side effects may I notice from receiving this medicine? Side effects that you should report to your doctor or health care professional as soon as possible:  allergic reactions like skin rash, itching or hives, swelling of the face, lips, or tongue  breathing problems  confusion  dizziness  fast or irregular heartbeat  feeling faint or lightheaded, falls  fever and chills  loss of balance or coordination  seizures  sweating  swelling of the hands and feet  tightness in the chest  tremors  unusually weak or tired Side effects that usually do not require medical attention (report to your doctor or health care professional if they continue or are bothersome):  constipation or diarrhea  headache This list may not describe all possible side effects. Call your  doctor for medical advice about side effects. You may report side effects to FDA at 1-800-FDA-1088. Where should I keep my medicine? Keep out of the reach of children. Store between 2 and 30 degrees C (36 and 86 degrees F). Throw away any unused medicine after the expiration date. NOTE:  This sheet is a summary. It may not cover all possible information. If you have questions about this medicine, talk to your doctor, pharmacist, or health care provider.  2020 Elsevier/Gold Standard (2018-05-18 07:14:10) Nausea, Adult Nausea is feeling sick to your stomach or feeling that you are about to throw up (vomit). Feeling sick to your stomach is usually not serious, but it may be an early sign of a more serious medical problem. As you feel sicker to your stomach, you may throw up. If you throw up, or if you are not able to drink enough fluids, there is a risk that you may lose too much water in your body (get dehydrated). If you lose too much water in your body, you may:  Feel tired.  Feel thirsty.  Have a dry mouth.  Have cracked lips.  Go pee (urinate) less often. Older adults and people who have other diseases or a weak body defense system (immune system) have a higher risk of losing too much water in the body. The main goals of treating this condition are:  To relieve your nausea.  To ensure your nausea occurs less often.  To prevent throwing up and losing too much fluid. Follow these instructions at home: Watch your symptoms for any changes. Tell your doctor about them. Follow these instructions as told by your doctor. Eating and drinking      Take an ORS (oral rehydration solution). This is a drink that is sold at pharmacies and stores.  Drink clear fluids in small amounts as you are able. These include: ? Water. ? Ice chips. ? Fruit juice that has water added (diluted fruit juice). ? Low-calorie sports drinks.  Eat bland, easy-to-digest foods in small amounts as you are able, such as: ? Bananas. ? Applesauce. ? Rice. ? Low-fat (lean) meats. ? Toast. ? Crackers.  Avoid drinking fluids that have a lot of sugar or caffeine in them. This includes energy drinks, sports drinks, and soda.  Avoid alcohol.  Avoid spicy or fatty foods. General  instructions  Take over-the-counter and prescription medicines only as told by your doctor.  Rest at home while you get better.  Drink enough fluid to keep your pee (urine) pale yellow.  Take slow and deep breaths when you feel sick to your stomach.  Avoid food or things that have strong smells.  Wash your hands often with soap and water. If you cannot use soap and water, use hand sanitizer.  Make sure that all people in your home wash their hands well and often.  Keep all follow-up visits as told by your doctor. This is important. Contact a doctor if:  You feel sicker to your stomach.  You feel sick to your stomach for more than 2 days.  You throw up.  You are not able to drink fluids without throwing up.  You have new symptoms.  You have a fever.  You have a headache.  You have muscle cramps.  You have a rash.  You have pain while peeing.  You feel light-headed or dizzy. Get help right away if:  You have pain in your chest, neck, arm, or jaw.  You feel very weak or you  pass out (faint).  You have throw up that is bright red or looks like coffee grounds.  You have bloody or black poop (stools) or poop that looks like tar.  You have a very bad headache, a stiff neck, or both.  You have very bad pain, cramping, or bloating in your belly (abdomen).  You have trouble breathing or you are breathing very quickly.  Your heart is beating very quickly.  Your skin feels cold and clammy.  You feel confused.  You have signs of losing too much water in your body, such as: ? Dark pee, very little pee, or no pee. ? Cracked lips. ? Dry mouth. ? Sunken eyes. ? Sleepiness. ? Weakness. These symptoms may be an emergency. Do not wait to see if the symptoms will go away. Get medical help right away. Call your local emergency services (911 in the [place] Do not drive yourself to the hospital. Summary  Nausea is feeling sick to your stomach or feeling that you are  about to throw up (vomit).  If you throw up, or if you are not able to drink enough fluids, there is a risk that you may lose too much water in your body (get dehydrated).  Eat and drink what your doctor tells you. Take over-the-counter and prescription medicines only as told by your doctor.  Contact a doctor right away if your symptoms get worse or you have new symptoms.  Keep all follow-up visits as told by your doctor. This is important. This information is not intended to replace advice given to you by your health care provider. Make sure you discuss any questions you have with your health care provider. Document Revised: 11/03/2017 Document Reviewed: 11/03/2017 Elsevier Patient Education  2020 [employer].
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
## 2019-09-26 ENCOUNTER — Ambulatory Visit (INDEPENDENT_AMBULATORY_CARE_PROVIDER_SITE_OTHER): Payer: Medicare (Managed Care) | Admitting: Physician Assistant

## 2019-09-26 ENCOUNTER — Encounter: Payer: Self-pay | Admitting: Orthopedic Surgery

## 2019-09-26 ENCOUNTER — Other Ambulatory Visit: Payer: Self-pay

## 2019-09-26 VITALS — Ht 73.0 in | Wt 237.0 lb

## 2019-09-26 DIAGNOSIS — Z89512 Acquired absence of left leg below knee: Secondary | ICD-10-CM | POA: Diagnosis not present
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
## 2019-10-05 ENCOUNTER — Telehealth: Payer: Self-pay | Admitting: Orthopedic Surgery

## 2019-10-05 NOTE — Telephone Encounter (Signed)
Jasmine from [employer]   She is requesting the appointment notes from the patient's last appointment be faxed over so that she can verify and satisfy the prescription.   Attention: Leavy Cella Fax number: [phone]

## 2019-10-07 NOTE — Telephone Encounter (Signed)
Office visit note faxed to prosthetic company to process rx given for supplies.

## 2019-11-14 ENCOUNTER — Other Ambulatory Visit: Payer: Self-pay | Admitting: Family Medicine

## 2019-11-14 DIAGNOSIS — I10 Essential (primary) hypertension: Secondary | ICD-10-CM

## 2019-11-14 DIAGNOSIS — IMO0002 Reserved for concepts with insufficient information to code with codable children: Secondary | ICD-10-CM

## 2019-11-14 DIAGNOSIS — E1165 Type 2 diabetes mellitus with hyperglycemia: Secondary | ICD-10-CM

## 2019-11-14 MED ORDER — AMLODIPINE BESYLATE 10 MG PO TABS: 10.0000 mg | ORAL_TABLET | Freq: Every day | ORAL | 3 refills | Status: DC

## 2019-11-23 ENCOUNTER — Other Ambulatory Visit: Payer: Self-pay | Admitting: Family Medicine

## 2019-11-23 DIAGNOSIS — I10 Essential (primary) hypertension: Secondary | ICD-10-CM

## 2019-11-23 DIAGNOSIS — IMO0002 Reserved for concepts with insufficient information to code with codable children: Secondary | ICD-10-CM

## 2019-11-23 DIAGNOSIS — E1165 Type 2 diabetes mellitus with hyperglycemia: Secondary | ICD-10-CM

## 2019-11-23 MED ORDER — ATORVASTATIN CALCIUM 10 MG PO TABS: 10.0000 mg | ORAL_TABLET | Freq: Every day | ORAL | 3 refills | Status: DC

## 2019-11-25 ENCOUNTER — Ambulatory Visit: Payer: Medicare (Managed Care) | Admitting: Family

## 2019-11-29 ENCOUNTER — Encounter: Payer: Self-pay | Admitting: Family

## 2019-11-29 ENCOUNTER — Ambulatory Visit (INDEPENDENT_AMBULATORY_CARE_PROVIDER_SITE_OTHER): Payer: Medicare (Managed Care) | Admitting: Family

## 2019-11-29 VITALS — Ht 73.0 in | Wt 237.0 lb

## 2019-11-29 DIAGNOSIS — Z89512 Acquired absence of left leg below knee: Secondary | ICD-10-CM
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
## 2019-12-05 ENCOUNTER — Telehealth: Payer: Self-pay | Admitting: Family

## 2019-12-05 NOTE — Telephone Encounter (Signed)
Could you please fax this for me?

## 2019-12-05 NOTE — Telephone Encounter (Signed)
[facility] called  They need the appointment notes from the patient's last visit faxed over.   Call back: [phone]

## 2019-12-05 NOTE — Telephone Encounter (Signed)
faxed

## 2019-12-06 ENCOUNTER — Telehealth: Payer: Self-pay | Admitting: Orthopedic Surgery

## 2019-12-06 NOTE — Telephone Encounter (Signed)
Records 2020-present faxed to [employer] [phone]

## 2019-12-10 ENCOUNTER — Emergency Department (HOSPITAL_COMMUNITY)
Admission: EM | Admit: 2019-12-10 | Discharge: 2019-12-10 | Disposition: A | Discharge status: Home / Self Care | Payer: Medicare (Managed Care) | Attending: Emergency Medicine | Admitting: Emergency Medicine

## 2019-12-10 ENCOUNTER — Other Ambulatory Visit: Payer: Self-pay

## 2019-12-10 ENCOUNTER — Encounter (HOSPITAL_COMMUNITY): Payer: Self-pay | Admitting: Emergency Medicine

## 2019-12-10 DIAGNOSIS — N183 Chronic kidney disease, stage 3 unspecified: Secondary | ICD-10-CM | POA: Diagnosis not present

## 2019-12-10 DIAGNOSIS — F17210 Nicotine dependence, cigarettes, uncomplicated: Secondary | ICD-10-CM | POA: Insufficient documentation

## 2019-12-10 DIAGNOSIS — Z89412 Acquired absence of left great toe: Secondary | ICD-10-CM | POA: Insufficient documentation

## 2019-12-10 DIAGNOSIS — Z89511 Acquired absence of right leg below knee: Secondary | ICD-10-CM | POA: Diagnosis not present

## 2019-12-10 DIAGNOSIS — E1122 Type 2 diabetes mellitus with diabetic chronic kidney disease: Secondary | ICD-10-CM | POA: Diagnosis not present

## 2019-12-10 DIAGNOSIS — R1084 Generalized abdominal pain: Secondary | ICD-10-CM | POA: Insufficient documentation

## 2019-12-10 DIAGNOSIS — Z794 Long term (current) use of insulin: Secondary | ICD-10-CM | POA: Insufficient documentation

## 2019-12-10 DIAGNOSIS — Z89422 Acquired absence of other left toe(s): Secondary | ICD-10-CM | POA: Insufficient documentation

## 2019-12-10 DIAGNOSIS — I129 Hypertensive chronic kidney disease with stage 1 through stage 4 chronic kidney disease, or unspecified chronic kidney disease: Secondary | ICD-10-CM | POA: Insufficient documentation

## 2019-12-10 DIAGNOSIS — Z79899 Other long term (current) drug therapy: Secondary | ICD-10-CM | POA: Insufficient documentation

## 2019-12-10 DIAGNOSIS — R112 Nausea with vomiting, unspecified: Secondary | ICD-10-CM | POA: Diagnosis present

## 2019-12-10 LAB — COMPREHENSIVE METABOLIC PANEL
ALT: 24 U/L (ref 0–44)
AST: 25 U/L (ref 15–41)
Albumin: 4.2 g/dL (ref 3.5–5.0)
Alkaline Phosphatase: 71 U/L (ref 38–126)
Anion gap: 11 (ref 5–15)
BUN: 16 mg/dL (ref 6–20)
CO2: 24 mmol/L (ref 22–32)
Calcium: 9.5 mg/dL (ref 8.9–10.3)
Chloride: 103 mmol/L (ref 98–111)
Creatinine, Ser: 1.58 mg/dL — ABNORMAL HIGH (ref 0.61–1.24)
GFR calc Af Amer: >60 mL/min (ref >60)
GFR calc non Af Amer: 53 mL/min — ABNORMAL LOW (ref >60)
Glucose, Bld: 146 mg/dL — ABNORMAL HIGH (ref 70–99)
Potassium: 3.7 mmol/L (ref 3.5–5.1)
Sodium: 138 mmol/L (ref 135–145)
Total Bilirubin: 0.6 mg/dL (ref 0.3–1.2)
Total Protein: 8.1 g/dL (ref 6.5–8.1)

## 2019-12-10 LAB — CBC
HCT: 42.3 % (ref 39.0–52.0)
Hemoglobin: 14.3 g/dL (ref 13.0–17.0)
MCH: 31.2 pg (ref 26.0–34.0)
MCHC: 33.8 g/dL (ref 30.0–36.0)
MCV: 92.4 fL (ref 80.0–100.0)
Platelets: 237 10*3/uL (ref 150–400)
RBC: 4.58 MIL/uL (ref 4.22–5.81)
RDW: 13.8 % (ref 11.5–15.5)
WBC: 9.3 10*3/uL (ref 4.0–10.5)
nRBC: 0 % (ref 0.0–0.2)

## 2019-12-10 LAB — URINALYSIS, ROUTINE W REFLEX MICROSCOPIC
Bacteria, UA: NONE SEEN
Bilirubin Urine: NEGATIVE
Glucose, UA: NEGATIVE mg/dL
Hgb urine dipstick: NEGATIVE
Ketones, ur: NEGATIVE mg/dL
Leukocytes,Ua: NEGATIVE
Nitrite: NEGATIVE
Protein, ur: >=300 mg/dL — AB
Specific Gravity, Urine: 1.014 (ref 1.005–1.030)
pH: 8 (ref 5.0–8.0)

## 2019-12-10 LAB — LIPASE, BLOOD: Lipase: 28 U/L (ref 11–51)

## 2019-12-10 MED ORDER — FAMOTIDINE IN NACL 20-0.9 MG/50ML-% IV SOLN
20.0000 mg | Freq: Once | INTRAVENOUS | Status: AC
  Administered 2019-12-10: 20 mg via INTRAVENOUS
  Filled 2019-12-10: qty 50

## 2019-12-10 MED ORDER — DROPERIDOL 2.5 MG/ML IJ SOLN
1.2500 mg | Freq: Once | INTRAMUSCULAR | Status: AC
  Administered 2019-12-10: 1.25 mg via INTRAVENOUS
  Filled 2019-12-10: qty 2

## 2019-12-10 MED ORDER — DIPHENHYDRAMINE HCL 50 MG/ML IJ SOLN
25.0000 mg | Freq: Once | INTRAMUSCULAR | Status: AC
  Administered 2019-12-10: 25 mg via INTRAVENOUS
  Filled 2019-12-10: qty 1

## 2019-12-10 MED ORDER — ALUM & MAG HYDROXIDE-SIMETH 200-200-20 MG/5ML PO SUSP
30.0000 mL | Freq: Once | ORAL | Status: AC
  Administered 2019-12-10: 30 mL via ORAL
  Filled 2019-12-10: qty 30

## 2019-12-10 MED ORDER — PROMETHAZINE HCL 25 MG RE SUPP
25.0000 mg | Freq: Four times a day (QID) | RECTAL | 0 refills | Status: DC | PRN
Start: 2019-12-10 — End: 2020-03-28

## 2019-12-10 MED ORDER — SODIUM CHLORIDE 0.9% FLUSH
3.0000 mL | Freq: Once | INTRAVENOUS | Status: AC
  Administered 2019-12-10: 3 mL via INTRAVENOUS

## 2019-12-10 MED ORDER — SODIUM CHLORIDE 0.9 % IV BOLUS
1000.0000 mL | Freq: Once | INTRAVENOUS | Status: AC
  Administered 2019-12-10: 1000 mL via INTRAVENOUS

## 2019-12-10 NOTE — ED Triage Notes (Signed)
Pt. Stated, Ive had vomiting and stomach pain since yesterday.

## 2019-12-10 NOTE — ED Notes (Signed)
Patient verbalizes understanding of discharge instructions. Opportunity for questioning and answers were provided. Armband removed by staff, pt discharged from ED and ambulated to lobby with spouse to go home.

## 2019-12-10 NOTE — Discharge Instructions (Signed)
Follow-up with your family doctor and your gastroenterologist.  Please return for inability to eat or drink or worsening abdominal pain or fever.
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
## 2019-12-21 ENCOUNTER — Telehealth: Payer: Self-pay | Admitting: Family Medicine

## 2019-12-22 ENCOUNTER — Other Ambulatory Visit: Payer: Self-pay

## 2019-12-22 ENCOUNTER — Other Ambulatory Visit: Payer: Self-pay | Admitting: Family Medicine

## 2019-12-22 DIAGNOSIS — E1165 Type 2 diabetes mellitus with hyperglycemia: Secondary | ICD-10-CM

## 2019-12-22 DIAGNOSIS — IMO0002 Reserved for concepts with insufficient information to code with codable children: Secondary | ICD-10-CM

## 2019-12-22 DIAGNOSIS — I10 Essential (primary) hypertension: Secondary | ICD-10-CM

## 2019-12-22 MED ORDER — LANCETS MISC: 1.0000 | Freq: Three times a day (TID) | 11 refills | Status: AC | PRN

## 2019-12-26 ENCOUNTER — Ambulatory Visit: Payer: Medicare (Managed Care) | Admitting: Orthopedic Surgery

## 2019-12-26 NOTE — Telephone Encounter (Signed)
Done

## 2020-01-03 ENCOUNTER — Other Ambulatory Visit: Payer: Self-pay | Admitting: Family Medicine

## 2020-01-03 ENCOUNTER — Telehealth: Payer: Self-pay

## 2020-01-03 MED ORDER — GLUCOSE BLOOD VI STRP: ORAL_STRIP | 12 refills | Status: DC

## 2020-01-03 NOTE — Telephone Encounter (Signed)
Jasmine from [facility]   She called to see if we received paperwork on the patients behalf  Call [phone]

## 2020-01-03 NOTE — Telephone Encounter (Signed)
This has been sent to pharmacy. Thanks!  

## 2020-01-04 NOTE — Telephone Encounter (Signed)
Good morning Tammy,   Have you received any paperwork from this patient? I looked in the chart and did not find anything.

## 2020-01-04 NOTE — Telephone Encounter (Signed)
I faxed an order to them yesterday about 5:15

## 2020-01-04 NOTE — Telephone Encounter (Signed)
Jasmine was called and has received paperwork for patient.

## 2020-01-04 NOTE — Telephone Encounter (Signed)
Ok, thank you

## 2020-01-04 NOTE — Telephone Encounter (Signed)
Tammy please read note below, thank you.

## 2020-01-05 ENCOUNTER — Other Ambulatory Visit: Payer: Self-pay

## 2020-01-05 DIAGNOSIS — IMO0002 Reserved for concepts with insufficient information to code with codable children: Secondary | ICD-10-CM

## 2020-01-05 MED ORDER — ATORVASTATIN CALCIUM 10 MG PO TABS: 10.0000 mg | ORAL_TABLET | Freq: Every day | ORAL | 0 refills | Status: DC

## 2020-03-21 ENCOUNTER — Ambulatory Visit: Payer: Medicare (Managed Care) | Admitting: Family Medicine

## 2020-03-28 ENCOUNTER — Other Ambulatory Visit: Payer: Self-pay

## 2020-03-28 ENCOUNTER — Encounter: Payer: Self-pay | Admitting: Family Medicine

## 2020-03-28 ENCOUNTER — Ambulatory Visit (INDEPENDENT_AMBULATORY_CARE_PROVIDER_SITE_OTHER): Payer: Medicare HMO | Admitting: Family Medicine

## 2020-03-28 VITALS — BP 119/69 | HR 73 | Temp 97.5°F | Resp 16 | Ht 73.0 in | Wt 237.6 lb

## 2020-03-28 DIAGNOSIS — R7303 Prediabetes: Secondary | ICD-10-CM

## 2020-03-28 DIAGNOSIS — E1165 Type 2 diabetes mellitus with hyperglycemia: Secondary | ICD-10-CM

## 2020-03-28 DIAGNOSIS — IMO0002 Reserved for concepts with insufficient information to code with codable children: Secondary | ICD-10-CM

## 2020-03-28 DIAGNOSIS — E119 Type 2 diabetes mellitus without complications: Secondary | ICD-10-CM

## 2020-03-28 DIAGNOSIS — R600 Localized edema: Secondary | ICD-10-CM

## 2020-03-28 DIAGNOSIS — E118 Type 2 diabetes mellitus with unspecified complications: Secondary | ICD-10-CM

## 2020-03-28 DIAGNOSIS — Z89511 Acquired absence of right leg below knee: Secondary | ICD-10-CM

## 2020-03-28 DIAGNOSIS — Z09 Encounter for follow-up examination after completed treatment for conditions other than malignant neoplasm: Secondary | ICD-10-CM

## 2020-03-28 DIAGNOSIS — R739 Hyperglycemia, unspecified: Secondary | ICD-10-CM

## 2020-03-28 LAB — POCT GLYCOSYLATED HEMOGLOBIN (HGB A1C)
HbA1c POC (<> result, manual entry): 6.5 % (ref 4.0–5.6)
HbA1c, POC (controlled diabetic range): 6.5 % (ref 0.0–7.0)
HbA1c, POC (prediabetic range): 6.5 % — AB (ref 5.7–6.4)
Hemoglobin A1C: 6.5 % — AB (ref 4.0–5.6)

## 2020-03-28 LAB — POCT URINALYSIS DIPSTICK
Bilirubin, UA: NEGATIVE
Blood, UA: NEGATIVE
Glucose, UA: NEGATIVE
Ketones, UA: NEGATIVE
Leukocytes, UA: NEGATIVE
Nitrite, UA: NEGATIVE
Protein, UA: POSITIVE — AB
Spec Grav, UA: 1.02 (ref 1.010–1.025)
Urobilinogen, UA: 0.2 E.U./dL
pH, UA: 5 (ref 5.0–8.0)

## 2020-03-28 LAB — GLUCOSE, POCT (MANUAL RESULT ENTRY): POC Glucose: 88 mg/dl (ref 70–99)

## 2020-03-28 MED ORDER — FUROSEMIDE 20 MG PO TABS: 20.0000 mg | ORAL_TABLET | Freq: Every day | ORAL | 3 refills | Status: DC | PRN
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
## 2020-04-03 ENCOUNTER — Encounter: Payer: Self-pay | Admitting: Family Medicine

## 2020-04-24 ENCOUNTER — Ambulatory Visit (INDEPENDENT_AMBULATORY_CARE_PROVIDER_SITE_OTHER): Payer: Medicare HMO | Admitting: Family Medicine

## 2020-04-24 ENCOUNTER — Other Ambulatory Visit: Payer: Self-pay

## 2020-04-24 ENCOUNTER — Other Ambulatory Visit: Payer: Self-pay | Admitting: Family Medicine

## 2020-04-24 ENCOUNTER — Ambulatory Visit (INDEPENDENT_AMBULATORY_CARE_PROVIDER_SITE_OTHER): Payer: Medicare HMO | Admitting: Orthopedic Surgery

## 2020-04-24 ENCOUNTER — Encounter: Payer: Self-pay | Admitting: Family Medicine

## 2020-04-24 VITALS — BP 144/86 | HR 81 | Temp 98.2°F | Ht 73.0 in | Wt 224.0 lb

## 2020-04-24 DIAGNOSIS — M7051 Other bursitis of knee, right knee: Secondary | ICD-10-CM | POA: Diagnosis not present

## 2020-04-24 DIAGNOSIS — Z89511 Acquired absence of right leg below knee: Secondary | ICD-10-CM

## 2020-04-24 DIAGNOSIS — IMO0002 Reserved for concepts with insufficient information to code with codable children: Secondary | ICD-10-CM

## 2020-04-24 DIAGNOSIS — R600 Localized edema: Secondary | ICD-10-CM

## 2020-04-24 DIAGNOSIS — R7309 Other abnormal glucose: Secondary | ICD-10-CM | POA: Diagnosis not present

## 2020-04-24 DIAGNOSIS — Z09 Encounter for follow-up examination after completed treatment for conditions other than malignant neoplasm: Secondary | ICD-10-CM

## 2020-04-24 DIAGNOSIS — Z89512 Acquired absence of left leg below knee: Secondary | ICD-10-CM

## 2020-04-24 DIAGNOSIS — R739 Hyperglycemia, unspecified: Secondary | ICD-10-CM

## 2020-04-24 DIAGNOSIS — Z760 Encounter for issue of repeat prescription: Secondary | ICD-10-CM

## 2020-04-24 DIAGNOSIS — E118 Type 2 diabetes mellitus with unspecified complications: Secondary | ICD-10-CM

## 2020-04-24 DIAGNOSIS — I10 Essential (primary) hypertension: Secondary | ICD-10-CM

## 2020-04-24 DIAGNOSIS — E1151 Type 2 diabetes mellitus with diabetic peripheral angiopathy without gangrene: Secondary | ICD-10-CM

## 2020-04-24 DIAGNOSIS — E1165 Type 2 diabetes mellitus with hyperglycemia: Secondary | ICD-10-CM

## 2020-04-24 MED ORDER — FUROSEMIDE 20 MG PO TABS: 20.0000 mg | ORAL_TABLET | Freq: Every day | ORAL | 3 refills | Status: DC | PRN

## 2020-04-24 MED ORDER — INSULIN GLARGINE 100 UNIT/ML ~~LOC~~ SOLN: 8.0000 [IU] | Freq: Every day | SUBCUTANEOUS | 3 refills | Status: DC

## 2020-04-24 MED ORDER — ATORVASTATIN CALCIUM 10 MG PO TABS: 10.0000 mg | ORAL_TABLET | Freq: Every day | ORAL | 3 refills | Status: DC

## 2020-04-24 MED ORDER — INSULIN ASPART 100 UNIT/ML ~~LOC~~ SOLN: 8.0000 [IU] | Freq: Three times a day (TID) | SUBCUTANEOUS | 3 refills | Status: DC

## 2020-04-24 MED ORDER — FAMOTIDINE 20 MG PO TABS: 20.0000 mg | ORAL_TABLET | Freq: Two times a day (BID) | ORAL | 3 refills | Status: DC

## 2020-04-24 MED ORDER — AMLODIPINE BESYLATE 10 MG PO TABS: 10.0000 mg | ORAL_TABLET | Freq: Every day | ORAL | 3 refills | Status: DC
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
## 2020-04-26 ENCOUNTER — Encounter: Payer: Self-pay | Admitting: Orthopedic Surgery
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
## 2020-04-29 ENCOUNTER — Emergency Department (HOSPITAL_COMMUNITY)
Admission: EM | Admit: 2020-04-29 | Discharge: 2020-04-29 | Disposition: A | Discharge status: Home / Self Care | Payer: Medicare HMO | Attending: Emergency Medicine | Admitting: Emergency Medicine

## 2020-04-29 ENCOUNTER — Other Ambulatory Visit: Payer: Self-pay

## 2020-04-29 ENCOUNTER — Encounter (HOSPITAL_COMMUNITY): Payer: Self-pay | Admitting: *Deleted

## 2020-04-29 ENCOUNTER — Emergency Department (HOSPITAL_COMMUNITY): Payer: Medicare HMO

## 2020-04-29 ENCOUNTER — Encounter: Payer: Self-pay | Admitting: Family Medicine

## 2020-04-29 DIAGNOSIS — Z48815 Encounter for surgical aftercare following surgery on the digestive system: Secondary | ICD-10-CM | POA: Insufficient documentation

## 2020-04-29 DIAGNOSIS — R1030 Lower abdominal pain, unspecified: Secondary | ICD-10-CM | POA: Insufficient documentation

## 2020-04-29 DIAGNOSIS — F17210 Nicotine dependence, cigarettes, uncomplicated: Secondary | ICD-10-CM | POA: Diagnosis not present

## 2020-04-29 DIAGNOSIS — E1140 Type 2 diabetes mellitus with diabetic neuropathy, unspecified: Secondary | ICD-10-CM | POA: Diagnosis not present

## 2020-04-29 DIAGNOSIS — I129 Hypertensive chronic kidney disease with stage 1 through stage 4 chronic kidney disease, or unspecified chronic kidney disease: Secondary | ICD-10-CM | POA: Diagnosis not present

## 2020-04-29 DIAGNOSIS — Y835 Amputation of limb(s) as the cause of abnormal reaction of the patient, or of later complication, without mention of misadventure at the time of the procedure: Secondary | ICD-10-CM | POA: Diagnosis not present

## 2020-04-29 DIAGNOSIS — Z89511 Acquired absence of right leg below knee: Secondary | ICD-10-CM | POA: Insufficient documentation

## 2020-04-29 DIAGNOSIS — R110 Nausea: Secondary | ICD-10-CM | POA: Diagnosis not present

## 2020-04-29 DIAGNOSIS — T8789 Other complications of amputation stump: Secondary | ICD-10-CM | POA: Insufficient documentation

## 2020-04-29 DIAGNOSIS — R809 Proteinuria, unspecified: Secondary | ICD-10-CM

## 2020-04-29 DIAGNOSIS — Z7984 Long term (current) use of oral hypoglycemic drugs: Secondary | ICD-10-CM | POA: Insufficient documentation

## 2020-04-29 DIAGNOSIS — M79609 Pain in unspecified limb: Secondary | ICD-10-CM

## 2020-04-29 DIAGNOSIS — Z794 Long term (current) use of insulin: Secondary | ICD-10-CM | POA: Insufficient documentation

## 2020-04-29 DIAGNOSIS — N183 Chronic kidney disease, stage 3 unspecified: Secondary | ICD-10-CM | POA: Diagnosis not present

## 2020-04-29 DIAGNOSIS — R109 Unspecified abdominal pain: Secondary | ICD-10-CM

## 2020-04-29 LAB — CBC
HCT: 42.3 % (ref 39.0–52.0)
Hemoglobin: 13.9 g/dL (ref 13.0–17.0)
MCH: 30.2 pg (ref 26.0–34.0)
MCHC: 32.9 g/dL (ref 30.0–36.0)
MCV: 92 fL (ref 80.0–100.0)
Platelets: 272 10*3/uL (ref 150–400)
RBC: 4.6 MIL/uL (ref 4.22–5.81)
RDW: 12.7 % (ref 11.5–15.5)
WBC: 10.3 10*3/uL (ref 4.0–10.5)
nRBC: 0 % (ref 0.0–0.2)

## 2020-04-29 LAB — COMPREHENSIVE METABOLIC PANEL
ALT: 20 U/L (ref 0–44)
AST: 22 U/L (ref 15–41)
Albumin: 4 g/dL (ref 3.5–5.0)
Alkaline Phosphatase: 74 U/L (ref 38–126)
Anion gap: 12 (ref 5–15)
BUN: 19 mg/dL (ref 6–20)
CO2: 26 mmol/L (ref 22–32)
Calcium: 10 mg/dL (ref 8.9–10.3)
Chloride: 98 mmol/L (ref 98–111)
Creatinine, Ser: 1.76 mg/dL — ABNORMAL HIGH (ref 0.61–1.24)
GFR, Estimated: 49 mL/min — ABNORMAL LOW (ref >60)
Glucose, Bld: 179 mg/dL — ABNORMAL HIGH (ref 70–99)
Potassium: 3.7 mmol/L (ref 3.5–5.1)
Sodium: 136 mmol/L (ref 135–145)
Total Bilirubin: 0.8 mg/dL (ref 0.3–1.2)
Total Protein: 8.6 g/dL — ABNORMAL HIGH (ref 6.5–8.1)

## 2020-04-29 LAB — LIPASE, BLOOD: Lipase: 31 U/L (ref 11–51)

## 2020-04-29 LAB — URINALYSIS, ROUTINE W REFLEX MICROSCOPIC
Bacteria, UA: NONE SEEN
Bilirubin Urine: NEGATIVE
Glucose, UA: NEGATIVE mg/dL
Hgb urine dipstick: NEGATIVE
Ketones, ur: 5 mg/dL — AB
Leukocytes,Ua: NEGATIVE
Nitrite: NEGATIVE
Protein, ur: 100 mg/dL — AB
Specific Gravity, Urine: 1.041 — ABNORMAL HIGH (ref 1.005–1.030)
pH: 7 (ref 5.0–8.0)

## 2020-04-29 MED ORDER — LACTATED RINGERS IV BOLUS
500.0000 mL | Freq: Once | INTRAVENOUS | Status: AC
  Administered 2020-04-29: 500 mL via INTRAVENOUS

## 2020-04-29 MED ORDER — ONDANSETRON 4 MG PO TBDP: 4.0000 mg | ORAL_TABLET | Freq: Three times a day (TID) | ORAL | 0 refills | Status: DC | PRN

## 2020-04-29 MED ORDER — ONDANSETRON HCL 4 MG/2ML IJ SOLN
4.0000 mg | Freq: Once | INTRAMUSCULAR | Status: AC
  Administered 2020-04-29: 4 mg via INTRAVENOUS
  Filled 2020-04-29: qty 2

## 2020-04-29 MED ORDER — MELOXICAM 7.5 MG PO TABS: 7.5000 mg | ORAL_TABLET | Freq: Two times a day (BID) | ORAL | 0 refills | Status: AC | PRN

## 2020-04-29 MED ORDER — METOCLOPRAMIDE HCL 5 MG/ML IJ SOLN
10.0000 mg | Freq: Once | INTRAMUSCULAR | Status: AC
  Administered 2020-04-29: 10 mg via INTRAVENOUS
  Filled 2020-04-29: qty 2

## 2020-04-29 MED ORDER — IOHEXOL 300 MG/ML  SOLN
75.0000 mL | Freq: Once | INTRAMUSCULAR | Status: AC | PRN
  Administered 2020-04-29: 75 mL via INTRAVENOUS

## 2020-04-29 MED ORDER — HYDROMORPHONE HCL 1 MG/ML IJ SOLN
1.0000 mg | Freq: Once | INTRAMUSCULAR | Status: AC
  Administered 2020-04-29: 1 mg via INTRAVENOUS
  Filled 2020-04-29: qty 1

## 2020-04-29 MED ORDER — LACTATED RINGERS IV BOLUS
1000.0000 mL | Freq: Once | INTRAVENOUS | Status: AC
  Administered 2020-04-29: 1000 mL via INTRAVENOUS

## 2020-04-29 NOTE — ED Notes (Signed)
Pt c/o sudden onset nausea. Order obtained for Zofran.

## 2020-04-29 NOTE — ED Notes (Signed)
Urine sent to lab WITH culture. 

## 2020-04-29 NOTE — ED Triage Notes (Signed)
The pt is c/o abd pain mausea vomiting no diarrhea also c/o his rt leg swollen for 4 days  bk amp on the rt

## 2020-04-29 NOTE — Discharge Instructions (Addendum)
Your tests are unremarkable showing no signs of problems causing your pain You do have protein in your urine - make sure you tell your doctor about this test this week - it needs to be rechecked. Please take Mobic twice a day as needed for pain Keep your leg elevated with a small ice pack wrapped in a towel intermittently Zofran as needed for nausea Follow-up with your doctor in 3 days if no improvement or emergency department for severe or worsening symptoms immediately

## 2020-04-29 NOTE — ED Provider Notes (Signed)
Pt accepted at change of shift - has CT pending -  No signs of acute abnormality, Lab work reassuring =- no UTI, Pt given nsaid and nausea meds for home, On my exam has no hot joint, no red skin, no signs of septic joint on the R.  Pt agreeable to plan.  HR 100 on my exam, no fever.   Eber Hong, MD 04/29/20 [id number]
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
## 2020-04-29 NOTE — ED Notes (Signed)
MD at bedside. Pt verbalizes pain relief to a 5/10.

## 2020-04-30 ENCOUNTER — Ambulatory Visit (INDEPENDENT_AMBULATORY_CARE_PROVIDER_SITE_OTHER): Payer: Medicare HMO | Admitting: Orthopedic Surgery

## 2020-04-30 ENCOUNTER — Encounter: Payer: Self-pay | Admitting: Orthopedic Surgery

## 2020-04-30 VITALS — Ht 73.0 in | Wt 230.0 lb

## 2020-04-30 DIAGNOSIS — M7051 Other bursitis of knee, right knee: Secondary | ICD-10-CM | POA: Diagnosis not present

## 2020-04-30 DIAGNOSIS — Z89512 Acquired absence of left leg below knee: Secondary | ICD-10-CM

## 2020-04-30 MED ORDER — LIDOCAINE HCL (PF) 1 % IJ SOLN
5.0000 mL | INTRAMUSCULAR | Status: AC | PRN
  Administered 2020-04-30: 5 mL

## 2020-04-30 MED ORDER — METHYLPREDNISOLONE ACETATE 40 MG/ML IJ SUSP
40.0000 mg | INTRAMUSCULAR | Status: AC | PRN
  Administered 2020-04-30: 40 mg via INTRA_ARTICULAR
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
## 2020-05-01 ENCOUNTER — Telehealth: Payer: Self-pay | Admitting: Orthopedic Surgery

## 2020-05-01 NOTE — Telephone Encounter (Signed)
Received vm from [facility] w/ [facility]. She received order and needs records, I faxed records 4/1 to present [phone]. Ph [phone]

## 2020-05-08 ENCOUNTER — Encounter: Payer: Self-pay | Admitting: Orthopedic Surgery

## 2020-05-08 ENCOUNTER — Ambulatory Visit (INDEPENDENT_AMBULATORY_CARE_PROVIDER_SITE_OTHER): Payer: Medicare HMO | Admitting: Orthopedic Surgery

## 2020-05-08 VITALS — Ht 73.0 in | Wt 230.0 lb

## 2020-05-08 DIAGNOSIS — Z89512 Acquired absence of left leg below knee: Secondary | ICD-10-CM

## 2020-05-08 DIAGNOSIS — M7051 Other bursitis of knee, right knee: Secondary | ICD-10-CM
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
## 2020-05-10 ENCOUNTER — Encounter (HOSPITAL_COMMUNITY): Payer: Self-pay | Admitting: Orthopedic Surgery

## 2020-05-10 ENCOUNTER — Other Ambulatory Visit: Payer: Self-pay

## 2020-05-10 ENCOUNTER — Other Ambulatory Visit: Payer: Self-pay | Admitting: Physician Assistant

## 2020-05-10 NOTE — Progress Notes (Signed)
[name] denies chest pain or shortness of breath.  Patient denies any s/s of Covid and has not been in contact with anyone that has s/s to his knowledge. [name] will be tested for Covid on arrival.  [name] has type Ii diabetes. Patient reports that CBG's run 100-200. Last Hgb A1C was 6.5 on 03/28/20. I instructed patient to take 10 units of Lantus Insulin tonight at HS. I instructed patient to check CBG after awaking and every 2 hours until arrival  to the hospital.  I Instructed patient if CBG is less than 70 to take 4 Glucose Tablets or 1 tube of Glucose Gel. Recheck CBG in 15 minutes then call pre- op desk at [phone] for further instructions.  I instructed [name] that if CBG is greater than 220 to call the Pre- Op desk and ask if he needs to take some Humolog, he has orders for meal coverage.

## 2020-05-11 ENCOUNTER — Encounter (HOSPITAL_COMMUNITY): Payer: Self-pay | Admitting: Orthopedic Surgery

## 2020-05-11 ENCOUNTER — Ambulatory Visit (HOSPITAL_COMMUNITY): Payer: Medicare HMO | Admitting: Anesthesiology

## 2020-05-11 ENCOUNTER — Ambulatory Visit (HOSPITAL_COMMUNITY)
Admission: RE | Admit: 2020-05-11 | Discharge: 2020-05-11 | Disposition: A | Discharge status: Home / Self Care | Payer: Medicare HMO | Attending: Orthopedic Surgery | Admitting: Orthopedic Surgery

## 2020-05-11 ENCOUNTER — Other Ambulatory Visit: Payer: Self-pay

## 2020-05-11 ENCOUNTER — Encounter (HOSPITAL_COMMUNITY)
Admission: RE | Disposition: A | Discharge status: Home / Self Care | Payer: Self-pay | Source: Home / Self Care | Attending: Orthopedic Surgery

## 2020-05-11 DIAGNOSIS — Z20822 Contact with and (suspected) exposure to covid-19: Secondary | ICD-10-CM | POA: Insufficient documentation

## 2020-05-11 DIAGNOSIS — Z89412 Acquired absence of left great toe: Secondary | ICD-10-CM | POA: Diagnosis not present

## 2020-05-11 DIAGNOSIS — M7041 Prepatellar bursitis, right knee: Secondary | ICD-10-CM | POA: Diagnosis not present

## 2020-05-11 DIAGNOSIS — Z89422 Acquired absence of other left toe(s): Secondary | ICD-10-CM | POA: Insufficient documentation

## 2020-05-11 DIAGNOSIS — Z79899 Other long term (current) drug therapy: Secondary | ICD-10-CM | POA: Diagnosis not present

## 2020-05-11 DIAGNOSIS — Z7984 Long term (current) use of oral hypoglycemic drugs: Secondary | ICD-10-CM | POA: Insufficient documentation

## 2020-05-11 DIAGNOSIS — Z89511 Acquired absence of right leg below knee: Secondary | ICD-10-CM | POA: Diagnosis not present

## 2020-05-11 HISTORY — DX: Endocrine disorder, unspecified: E34.9

## 2020-05-11 HISTORY — PX: I & D EXTREMITY: SHX5045

## 2020-05-11 LAB — SARS CORONAVIRUS 2 BY RT PCR (HOSPITAL ORDER, PERFORMED IN ~~LOC~~ HOSPITAL LAB): SARS Coronavirus 2: NEGATIVE

## 2020-05-11 LAB — GLUCOSE, CAPILLARY
Glucose-Capillary: 115 mg/dL — ABNORMAL HIGH (ref 70–99)
Glucose-Capillary: 93 mg/dL (ref 70–99)

## 2020-05-11 SURGERY — IRRIGATION AND DEBRIDEMENT EXTREMITY
Anesthesia: General | Laterality: Right

## 2020-05-11 MED ORDER — LACTATED RINGERS IV SOLN: INTRAVENOUS | Status: DC

## 2020-05-11 MED ORDER — DEXAMETHASONE SODIUM PHOSPHATE 10 MG/ML IJ SOLN
INTRAMUSCULAR | Status: DC | PRN
  Administered 2020-05-11: 4 mg via INTRAVENOUS

## 2020-05-11 MED ORDER — OXYCODONE HCL 5 MG PO TABS
5.0000 mg | ORAL_TABLET | Freq: Once | ORAL | Status: AC | PRN
  Administered 2020-05-11: 5 mg via ORAL

## 2020-05-11 MED ORDER — PHENYLEPHRINE 40 MCG/ML (10ML) SYRINGE FOR IV PUSH (FOR BLOOD PRESSURE SUPPORT)
PREFILLED_SYRINGE | INTRAVENOUS | Status: AC
  Filled 2020-05-11: qty 10

## 2020-05-11 MED ORDER — FENTANYL CITRATE (PF) 100 MCG/2ML IJ SOLN
INTRAMUSCULAR | Status: AC
  Filled 2020-05-11: qty 2

## 2020-05-11 MED ORDER — PROPOFOL 10 MG/ML IV BOLUS
INTRAVENOUS | Status: DC | PRN
  Administered 2020-05-11: 20 mg via INTRAVENOUS
  Administered 2020-05-11: 180 mg via INTRAVENOUS

## 2020-05-11 MED ORDER — PROPOFOL 10 MG/ML IV BOLUS
INTRAVENOUS | Status: AC
  Filled 2020-05-11: qty 20

## 2020-05-11 MED ORDER — FENTANYL CITRATE (PF) 250 MCG/5ML IJ SOLN
INTRAMUSCULAR | Status: AC
  Filled 2020-05-11: qty 5

## 2020-05-11 MED ORDER — LIDOCAINE HCL (CARDIAC) PF 100 MG/5ML IV SOSY
PREFILLED_SYRINGE | INTRAVENOUS | Status: DC | PRN
  Administered 2020-05-11: 80 mg via INTRAVENOUS

## 2020-05-11 MED ORDER — ACETAMINOPHEN 325 MG PO TABS
ORAL_TABLET | ORAL | Status: AC
  Filled 2020-05-11: qty 2

## 2020-05-11 MED ORDER — ACETAMINOPHEN 325 MG PO TABS
325.0000 mg | ORAL_TABLET | ORAL | Status: DC | PRN
  Administered 2020-05-11: 650 mg via ORAL

## 2020-05-11 MED ORDER — ORAL CARE MOUTH RINSE: 15.0000 mL | Freq: Once | OROMUCOSAL | Status: AC

## 2020-05-11 MED ORDER — ONDANSETRON HCL 4 MG/2ML IJ SOLN: 4.0000 mg | Freq: Once | INTRAMUSCULAR | Status: DC | PRN

## 2020-05-11 MED ORDER — MIDAZOLAM HCL 2 MG/2ML IJ SOLN
INTRAMUSCULAR | Status: DC | PRN
  Administered 2020-05-11: 2 mg via INTRAVENOUS

## 2020-05-11 MED ORDER — OXYCODONE HCL 5 MG PO TABS
ORAL_TABLET | ORAL | Status: AC
  Filled 2020-05-11: qty 1

## 2020-05-11 MED ORDER — MIDAZOLAM HCL 2 MG/2ML IJ SOLN
INTRAMUSCULAR | Status: AC
  Filled 2020-05-11: qty 2

## 2020-05-11 MED ORDER — FENTANYL CITRATE (PF) 100 MCG/2ML IJ SOLN
25.0000 ug | INTRAMUSCULAR | Status: DC | PRN
  Administered 2020-05-11 (×2): 50 ug via INTRAVENOUS

## 2020-05-11 MED ORDER — ONDANSETRON HCL 4 MG/2ML IJ SOLN
INTRAMUSCULAR | Status: DC | PRN
  Administered 2020-05-11: 4 mg via INTRAVENOUS

## 2020-05-11 MED ORDER — PHENYLEPHRINE HCL (PRESSORS) 10 MG/ML IV SOLN
INTRAVENOUS | Status: DC | PRN
  Administered 2020-05-11: 120 ug via INTRAVENOUS

## 2020-05-11 MED ORDER — OXYCODONE HCL 5 MG/5ML PO SOLN: 5.0000 mg | Freq: Once | ORAL | Status: AC | PRN

## 2020-05-11 MED ORDER — MEPERIDINE HCL 25 MG/ML IJ SOLN: 6.2500 mg | INTRAMUSCULAR | Status: DC | PRN

## 2020-05-11 MED ORDER — CHLORHEXIDINE GLUCONATE 0.12 % MT SOLN
OROMUCOSAL | Status: AC
  Administered 2020-05-11: 15 mL via OROMUCOSAL
  Filled 2020-05-11: qty 15

## 2020-05-11 MED ORDER — CEFAZOLIN SODIUM-DEXTROSE 2-4 GM/100ML-% IV SOLN
2.0000 g | INTRAVENOUS | Status: AC
  Administered 2020-05-11: 2 g via INTRAVENOUS
  Filled 2020-05-11: qty 100

## 2020-05-11 MED ORDER — OXYCODONE-ACETAMINOPHEN 5-325 MG PO TABS
1.0000 | ORAL_TABLET | ORAL | 0 refills | Status: DC | PRN
Start: 2020-05-11 — End: 2020-05-16

## 2020-05-11 MED ORDER — FENTANYL CITRATE (PF) 100 MCG/2ML IJ SOLN
INTRAMUSCULAR | Status: DC | PRN
  Administered 2020-05-11: 50 ug via INTRAVENOUS

## 2020-05-11 MED ORDER — CHLORHEXIDINE GLUCONATE 0.12 % MT SOLN: 15.0000 mL | Freq: Once | OROMUCOSAL | Status: AC

## 2020-05-11 MED ORDER — 0.9 % SODIUM CHLORIDE (POUR BTL) OPTIME
TOPICAL | Status: DC | PRN
  Administered 2020-05-11: 1000 mL

## 2020-05-11 MED ORDER — DEXAMETHASONE SODIUM PHOSPHATE 10 MG/ML IJ SOLN
INTRAMUSCULAR | Status: AC
  Filled 2020-05-11: qty 1

## 2020-05-11 MED ORDER — ACETAMINOPHEN 160 MG/5ML PO SOLN: 325.0000 mg | ORAL | Status: DC | PRN

## 2020-05-11 MED ORDER — ONDANSETRON HCL 4 MG/2ML IJ SOLN
INTRAMUSCULAR | Status: AC
  Filled 2020-05-11: qty 2

## 2020-05-11 SURGICAL SUPPLY — 27 items
BLADE SURG 21 STRL SS (BLADE) ×3 IMPLANT
BNDG COHESIVE 6X5 TAN STRL LF (GAUZE/BANDAGES/DRESSINGS) ×2 IMPLANT
BNDG GAUZE ELAST 4 BULKY (GAUZE/BANDAGES/DRESSINGS) ×4 IMPLANT
COVER SURGICAL LIGHT HANDLE (MISCELLANEOUS) ×6 IMPLANT
DRAPE U-SHAPE 47X51 STRL (DRAPES) ×3 IMPLANT
DRSG ADAPTIC 3X8 NADH LF (GAUZE/BANDAGES/DRESSINGS) ×3 IMPLANT
DRSG EMULSION OIL 3X3 NADH (GAUZE/BANDAGES/DRESSINGS) ×2 IMPLANT
DURAPREP 26ML APPLICATOR (WOUND CARE) ×3 IMPLANT
GAUZE SPONGE 4X4 12PLY STRL (GAUZE/BANDAGES/DRESSINGS) ×3 IMPLANT
GLOVE BIOGEL PI IND STRL 9 (GLOVE) ×1 IMPLANT
GLOVE BIOGEL PI INDICATOR 9 (GLOVE) ×2
GLOVE SURG ORTHO 9.0 STRL STRW (GLOVE) ×3 IMPLANT
GOWN STRL REUS W/ TWL XL LVL3 (GOWN DISPOSABLE) ×2 IMPLANT
GOWN STRL REUS W/TWL XL LVL3 (GOWN DISPOSABLE) ×6
KIT BASIN OR (CUSTOM PROCEDURE TRAY) ×3 IMPLANT
KIT TURNOVER KIT B (KITS) ×3 IMPLANT
MANIFOLD NEPTUNE II (INSTRUMENTS) ×3 IMPLANT
NS IRRIG 1000ML POUR BTL (IV SOLUTION) ×3 IMPLANT
PACK ORTHO EXTREMITY (CUSTOM PROCEDURE TRAY) ×3 IMPLANT
PAD ABD 8X10 STRL (GAUZE/BANDAGES/DRESSINGS) ×3 IMPLANT
PAD ARMBOARD 7.5X6 YLW CONV (MISCELLANEOUS) ×6 IMPLANT
SUT ETHILON 2 0 PSLX (SUTURE) ×3 IMPLANT
SWAB COLLECTION DEVICE MRSA (MISCELLANEOUS) ×3 IMPLANT
TOWEL GREEN STERILE (TOWEL DISPOSABLE) ×3 IMPLANT
TUBE CONNECTING 12'X1/4 (SUCTIONS) ×1
TUBE CONNECTING 12X1/4 (SUCTIONS) ×2 IMPLANT
YANKAUER SUCT BULB TIP NO VENT (SUCTIONS) ×3 IMPLANT

## 2020-05-11 NOTE — Interval H&P Note (Signed)
History and Physical Interval Note:  05/11/2020 4:11 PM  [name]  has presented today for surgery, with the diagnosis of Prepatella Bursitis Right Knee.  The various methods of treatment have been discussed with the patient and family. After consideration of risks, benefits and other options for treatment, the patient has consented to  Procedure(s): EXCISION PREPATELLA BURSA RIGHT KNEE (Right) as a surgical intervention.  The patient's history has been reviewed, patient examined, no change in status, stable for surgery.  I have reviewed the patient's chart and labs.  Questions were answered to the patient's satisfaction.     Nadara Mustard

## 2020-05-11 NOTE — Anesthesia Postprocedure Evaluation (Signed)
Anesthesia Post Note  Patient: [name]  Procedure(s) Performed: EXCISION PREPATELLA BURSA RIGHT KNEE (Right )     Patient location during evaluation: PACU Anesthesia Type: General Level of consciousness: awake and alert, patient cooperative and oriented Pain management: pain level controlled Vital Signs Assessment: post-procedure vital signs reviewed and stable Respiratory status: spontaneous breathing, nonlabored ventilation and respiratory function stable Cardiovascular status: blood pressure returned to baseline and stable Postop Assessment: no apparent nausea or vomiting Anesthetic complications: no   No complications documented.  Last Vitals:  Vitals:   05/11/20 1730 05/11/20 1745  BP: 116/72 124/83  Pulse: 77 65  Resp: 19 18  Temp: 36.5 C   SpO2: 99% 100%    Last Pain:  Vitals:   05/11/20 1730  TempSrc:   PainSc: 4                  Kolbie Clarkston,E. Ksenia Kunz

## 2020-05-11 NOTE — Interval H&P Note (Signed)
History and Physical Interval Note:  05/11/2020 3:06 PM  [name]  has presented today for surgery, with the diagnosis of Prepatella Bursitis Right Knee.  The various methods of treatment have been discussed with the patient and family. After consideration of risks, benefits and other options for treatment, the patient has consented to  Procedure(s): EXCISION PREPATELLA BURSA RIGHT KNEE (Right) as a surgical intervention.  The patient's history has been reviewed, patient examined, no change in status, stable for surgery.  I have reviewed the patient's chart and labs.  Questions were answered to the patient's satisfaction.     Nadara Mustard

## 2020-05-11 NOTE — Anesthesia Preprocedure Evaluation (Addendum)
Anesthesia Evaluation  Patient identified by MRN, date of birth, ID band Patient awake    Reviewed: Allergy & Precautions, NPO status , Patient's Chart, lab work & pertinent test results  History of Anesthesia Complications Negative for: history of anesthetic complications  Airway Mallampati: II  TM Distance: >3 FB Neck ROM: Full    Dental  (+) Teeth Intact, Dental Advisory Given   Pulmonary Current Smoker and Patient abstained from smoking., former smoker,    breath sounds clear to auscultation       Cardiovascular hypertension, Pt. on medications + Peripheral Vascular Disease   Rhythm:Regular     Neuro/Psych  Headaches,    GI/Hepatic GERD  Medicated and Controlled,  Endo/Other  diabetes, Type 2, Oral Hypoglycemic Agents  Renal/GU Renal InsufficiencyRenal disease     Musculoskeletal  (+) Arthritis ,   Abdominal   Peds  Hematology negative hematology ROS (+) Lab Results      Component                Value               Date                      WBC                      10.3                04/29/2020                HGB                      13.9                04/29/2020                HCT                      42.3                04/29/2020                MCV                      92.0                04/29/2020                PLT                      272                 04/29/2020              Anesthesia Other Findings   Reproductive/Obstetrics                           Anesthesia Physical  Anesthesia Plan  ASA: III  Anesthesia Plan: General   Post-op Pain Management:    Induction: Intravenous  PONV Risk Score and Plan: 2  Airway Management Planned: Oral ETT  Additional Equipment:   Intra-op Plan:   Post-operative Plan: Extubation in OR  Informed Consent: I have reviewed the patients History and Physical, chart, labs and discussed the procedure including the risks, benefits  and alternatives for the proposed anesthesia with the patient or authorized representative who  has indicated his/her understanding and acceptance.       Plan Discussed with: CRNA and Surgeon  Anesthesia Plan Comments:         Anesthesia Quick Evaluation
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
## 2020-05-11 NOTE — Op Note (Signed)
05/11/2020  4:50 PM  PATIENT:  [name]    PRE-OPERATIVE DIAGNOSIS:  Prepatella Bursitis Right Knee  POST-OPERATIVE DIAGNOSIS:  Same  PROCEDURE:  EXCISION PREPATELLA BURSA RIGHT KNEE  SURGEON:  Nadara Mustard, MD  PHYSICIAN ASSISTANT:None ANESTHESIA:   General  PREOPERATIVE INDICATIONS:  [name] is a  44 y.o. male with a diagnosis of Prepatella Bursitis Right Knee who failed conservative measures and elected for surgical management.    The risks benefits and alternatives were discussed with the patient preoperatively including but not limited to the risks of infection, bleeding, nerve injury, cardiopulmonary complications, the need for revision surgery, among others, and the patient was willing to proceed.  OPERATIVE IMPLANTS: none  @ENCIMAGES @  OPERATIVE FINDINGS: Prepatellar bursal sac with clear fluid no signs of infection  OPERATIVE PROCEDURE: Patient brought the operating room and underwent a general anesthetic.  After adequate levels anesthesia were obtained patient's right lower extremity was prepped using DuraPrep draped into a sterile field a timeout was called.  A elliptical incision was made longitudinally over the bursal sac.  Using electrocautery and sharp dissection the bursal sac was excised in 1 block of tissue electrocautery was used hemostasis the wound was irrigated with normal saline.  The incision was closed using 2-0 nylon a sterile dressing was applied.  The fluid in the bursal sac was clear no signs of infection.  Patient was extubated taken the PACU in stable condition.   DISCHARGE PLANNING:  Antibiotic duration: Preoperative antibiotics  Weightbearing: Hold on prosthetic use until follow-up  Pain medication: Prescription for Percocet  Dressing care/ Wound VAC: Change dressing at follow-up  Ambulatory devices: Walker or crutches  Discharge to: Home.  Follow-up: In the office 1 week post operative.

## 2020-05-11 NOTE — Transfer of Care (Signed)
Immediate Anesthesia Transfer of Care Note  Patient: [name]  Procedure(s) Performed: EXCISION PREPATELLA BURSA RIGHT KNEE (Right )  Patient Location: PACU  Anesthesia Type:General  Level of Consciousness: awake and alert   Airway & Oxygen Therapy: Patient Spontanous Breathing and Patient connected to face mask oxygen  Post-op Assessment: Report given to RN and Post -op Vital signs reviewed and stable  Post vital signs: Reviewed and stable  Last Vitals:  Vitals Value Taken Time  BP 131/75 05/11/20 1702  Temp    Pulse 78 05/11/20 1704  Resp 19 05/11/20 1704  SpO2 100 % 05/11/20 1704  Vitals shown include unvalidated device data.  Last Pain:  Vitals:   05/11/20 1352  TempSrc:   PainSc: 7       Patients Stated Pain Goal: 4 (05/11/20 1352)  Complications: No complications documented.

## 2020-05-11 NOTE — Anesthesia Procedure Notes (Signed)
Procedure Name: LMA Insertion Date/Time: 05/11/2020 4:29 PM Performed by: Epifanio Lesches, CRNA Pre-anesthesia Checklist: Patient identified, Emergency Drugs available, Suction available and Patient being monitored Patient Re-evaluated:Patient Re-evaluated prior to induction Oxygen Delivery Method: Circle System Utilized Preoxygenation: Pre-oxygenation with 100% oxygen Induction Type: IV induction Ventilation: Mask ventilation without difficulty LMA: LMA inserted LMA Size: 5.0 Number of attempts: 1 Airway Equipment and Method: Bite block Placement Confirmation: positive ETCO2 Tube secured with: Tape Dental Injury: Teeth and Oropharynx as per pre-operative assessment

## 2020-05-12 ENCOUNTER — Encounter (HOSPITAL_COMMUNITY): Payer: Self-pay | Admitting: Orthopedic Surgery

## 2020-05-15 ENCOUNTER — Ambulatory Visit: Payer: Medicare HMO | Admitting: Orthopedic Surgery

## 2020-05-16 ENCOUNTER — Telehealth: Payer: Self-pay | Admitting: Physician Assistant

## 2020-05-16 ENCOUNTER — Other Ambulatory Visit: Payer: Self-pay | Admitting: Physician Assistant

## 2020-05-16 MED ORDER — OXYCODONE-ACETAMINOPHEN 5-325 MG PO TABS: 1.0000 | ORAL_TABLET | ORAL | 0 refills | Status: DC | PRN

## 2020-05-16 NOTE — Telephone Encounter (Signed)
05/11/20 excision prepatellar bursa right knee requesting refill on pain medication. Has an appt for follow up on Friday. Please advise.

## 2020-05-16 NOTE — Telephone Encounter (Signed)
Patient called requesting a refill of oxycodone. Please send to pharmacy on file. Patient phone number is [phone]. 

## 2020-05-16 NOTE — Telephone Encounter (Signed)
Message sent to PA for review will sign off on this message.

## 2020-05-16 NOTE — Telephone Encounter (Signed)
Patient called about update on refill of medication. Please call patient at [phone].

## 2020-05-16 NOTE — Telephone Encounter (Signed)
Called in and contacted patient

## 2020-05-18 ENCOUNTER — Encounter: Payer: Self-pay | Admitting: Physician Assistant

## 2020-05-18 ENCOUNTER — Ambulatory Visit (INDEPENDENT_AMBULATORY_CARE_PROVIDER_SITE_OTHER): Payer: Medicare HMO | Admitting: Physician Assistant

## 2020-05-18 DIAGNOSIS — M7051 Other bursitis of knee, right knee: Secondary | ICD-10-CM
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
## 2020-05-22 ENCOUNTER — Telehealth: Payer: Self-pay | Admitting: Physician Assistant

## 2020-05-22 ENCOUNTER — Other Ambulatory Visit: Payer: Self-pay | Admitting: Physician Assistant

## 2020-05-22 MED ORDER — OXYCODONE-ACETAMINOPHEN 5-325 MG PO TABS: 1.0000 | ORAL_TABLET | Freq: Four times a day (QID) | ORAL | 0 refills | Status: DC | PRN

## 2020-05-22 NOTE — Telephone Encounter (Signed)
I called pt to advise of message below. Will call with any other questions.

## 2020-05-22 NOTE — Telephone Encounter (Signed)
Patient called requesting a refill of oxycodone. Please send to pharmacy on file. Patient phone number is [phone]. 

## 2020-05-22 NOTE — Telephone Encounter (Signed)
S/p right knee patella bursa excision 05/11/20 asking for refill on Oxycodone last refill was 05/16/20 please advise.

## 2020-05-22 NOTE — Telephone Encounter (Signed)
Please see below.

## 2020-05-22 NOTE — Telephone Encounter (Signed)
Refill done but changed interval to every 6 hours rather than 4

## 2020-05-24 ENCOUNTER — Ambulatory Visit (INDEPENDENT_AMBULATORY_CARE_PROVIDER_SITE_OTHER): Payer: Medicare HMO | Admitting: Physician Assistant

## 2020-05-24 ENCOUNTER — Encounter: Payer: Self-pay | Admitting: Physician Assistant

## 2020-05-24 DIAGNOSIS — M7051 Other bursitis of knee, right knee: Secondary | ICD-10-CM
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
## 2020-05-30 ENCOUNTER — Other Ambulatory Visit: Payer: Self-pay

## 2020-05-30 ENCOUNTER — Encounter: Payer: Self-pay | Admitting: Physician Assistant

## 2020-05-30 ENCOUNTER — Ambulatory Visit (INDEPENDENT_AMBULATORY_CARE_PROVIDER_SITE_OTHER): Payer: Medicare HMO | Admitting: Physician Assistant

## 2020-05-30 VITALS — Ht 73.0 in | Wt 230.0 lb

## 2020-05-30 DIAGNOSIS — M7051 Other bursitis of knee, right knee: Secondary | ICD-10-CM

## 2020-05-30 MED ORDER — DOXYCYCLINE HYCLATE 100 MG PO TABS: 100.0000 mg | ORAL_TABLET | Freq: Two times a day (BID) | ORAL | 0 refills | Status: DC
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
## 2020-06-04 ENCOUNTER — Telehealth: Payer: Self-pay | Admitting: Orthopedic Surgery

## 2020-06-04 NOTE — Telephone Encounter (Signed)
Pt wants to know if he can get a refill on percocet. He fell this weekend and is a lot of pain.

## 2020-06-04 NOTE — Telephone Encounter (Signed)
Please advise 

## 2020-06-04 NOTE — Telephone Encounter (Signed)
I called patient and advised. He states that he has an appointment on 06/14/2020. I offered to work him in sooner due to the pain that he is in. He said that he will call us back if he decides he needs sooner appointment.

## 2020-06-04 NOTE — Telephone Encounter (Signed)
If he fell needs to be seen

## 2020-06-14 ENCOUNTER — Telehealth: Payer: Self-pay | Admitting: Orthopedic Surgery

## 2020-06-14 ENCOUNTER — Ambulatory Visit (INDEPENDENT_AMBULATORY_CARE_PROVIDER_SITE_OTHER): Payer: Medicare HMO | Admitting: Orthopedic Surgery

## 2020-06-14 ENCOUNTER — Encounter: Payer: Self-pay | Admitting: Orthopedic Surgery

## 2020-06-14 ENCOUNTER — Other Ambulatory Visit: Payer: Self-pay

## 2020-06-14 DIAGNOSIS — M7051 Other bursitis of knee, right knee: Secondary | ICD-10-CM

## 2020-06-14 MED ORDER — OXYCODONE-ACETAMINOPHEN 5-325 MG PO TABS: 1.0000 | ORAL_TABLET | Freq: Four times a day (QID) | ORAL | 0 refills | Status: DC | PRN
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
## 2020-06-14 NOTE — Telephone Encounter (Signed)
I called pt and advised that this rx has been sent for Percocet to the [facility] at [place]. To call with any other questions.

## 2020-06-14 NOTE — Telephone Encounter (Signed)
Pt was wondering if you have refilled his prescription. Please call him when you do.

## 2020-06-27 ENCOUNTER — Encounter: Payer: Self-pay | Admitting: Family Medicine

## 2020-06-27 ENCOUNTER — Telehealth (INDEPENDENT_AMBULATORY_CARE_PROVIDER_SITE_OTHER): Payer: Medicare HMO | Admitting: Family Medicine

## 2020-06-27 VITALS — Ht 73.0 in | Wt 230.0 lb

## 2020-06-27 DIAGNOSIS — M25461 Effusion, right knee: Secondary | ICD-10-CM

## 2020-06-27 DIAGNOSIS — Z09 Encounter for follow-up examination after completed treatment for conditions other than malignant neoplasm: Secondary | ICD-10-CM | POA: Diagnosis not present

## 2020-06-27 DIAGNOSIS — M25561 Pain in right knee: Secondary | ICD-10-CM | POA: Diagnosis not present

## 2020-06-27 DIAGNOSIS — I10 Essential (primary) hypertension: Secondary | ICD-10-CM | POA: Diagnosis not present

## 2020-06-27 DIAGNOSIS — E119 Type 2 diabetes mellitus without complications: Secondary | ICD-10-CM

## 2020-06-27 DIAGNOSIS — R7309 Other abnormal glucose: Secondary | ICD-10-CM | POA: Diagnosis not present

## 2020-06-27 DIAGNOSIS — E1165 Type 2 diabetes mellitus with hyperglycemia: Secondary | ICD-10-CM

## 2020-06-27 DIAGNOSIS — Z89511 Acquired absence of right leg below knee: Secondary | ICD-10-CM | POA: Diagnosis not present

## 2020-06-27 DIAGNOSIS — M7041 Prepatellar bursitis, right knee: Secondary | ICD-10-CM

## 2020-06-27 MED ORDER — IBUPROFEN 800 MG PO TABS: 800.0000 mg | ORAL_TABLET | Freq: Three times a day (TID) | ORAL | 3 refills | Status: AC | PRN

## 2020-06-27 NOTE — Progress Notes (Signed)
Virtual Visit via Telephone Note  I connected with [name] on 07/03/20 at  2:00 PM EST by telephone and verified that I am speaking with the correct person using two identifiers.  Location: Patient: Home Provider: Home   I discussed the limitations, risks, security and privacy concerns of performing an evaluation and management service by telephone and the availability of in person appointments. I also discussed with the patient that there may be a patient responsible charge related to this service. The patient expressed understanding and agreed to proceed.   History of Present Illness:  Patient Active Problem List   Diagnosis Date Noted  . Prepatellar bursitis of right knee   . Hyperlipidemia 04/01/2018  . Hypertension 04/01/2018  . Chronic anemia   . Diabetes mellitus type 2 with complications, uncontrolled (HCC)   . Diabetic peripheral neuropathy (HCC)   . PVD (peripheral vascular disease) (HCC)   . Hypokalemia   . Acute blood loss anemia   . Post-operative pain   . Unilateral complete BKA, right, subsequent encounter (HCC)   . Diabetic polyneuropathy associated with type 2 diabetes mellitus (HCC) 07/10/2016  . Polysubstance abuse (HCC) 03/08/2016  . Tobacco abuse 02/27/2016  . Gastroparesis 05/28/2015  . GERD (gastroesophageal reflux disease) 10/01/2014  . Esophageal reflux   . Fever 09/27/2014  . Abdominal pain, lower 09/27/2014  . Abnormal ECG 05/30/2014  . Chest pain 05/30/2014  . Ankle pain 05/30/2014  . Pain in the chest   . Nausea 08/30/2013  . Epigastric abdominal pain 08/30/2013  . Low grade fever 08/30/2013  . Diabetic osteomyelitis b/l toes 03/23/2013  . DM (diabetes mellitus) type II uncontrolled, periph vascular disorder (HCC) 03/23/2013  . CKD (chronic kidney disease), stage III (HCC) 03/23/2013  . Anemia 03/23/2013  . Benign essential HTN 03/23/2013  . AKI (acute kidney injury) (HCC) 03/22/2013  . Viral gastroenteritis 09/17/2012  . Nausea &  vomiting 09/16/2012  . Acute pancreatitis 09/16/2012  . Diabetes mellitus (HCC) 09/20/2010  . Onychomycosis 09/20/2010  . METHICILLIN SUSCEPTIBLE STAPH AUREUS SEPTICEMIA 07/30/2010   Current Status: Since his last office visit, he has had surgical procedure of Excision Prepatella Bursa of his right knee on 05/11/2020. He has history of right BKA. Today, he states that he is doing well, but continues to have incidents of discomfort and swelling at the site. He has followed up with Dr. Lajoyce Corners and has upcoming follow up soon. He denies visual changes, chest pain, cough, shortness of breath, heart palpitations, and falls. He has occasional headaches and dizziness with position changes. Denies severe headaches, confusion, seizures, double vision, and blurred vision, nausea and vomiting. He denies fatigue, frequent urination, excessive hunger, excessive thirst, weight gain, weight loss, and poor wound healing. He continues to check his feet regularly. He denies fevers, chills, recent infections, weight loss, and night sweats. Denies GI problems such as diarrhea, and constipation. He has no reports of blood in stools, dysuria and hematuria. No depression or anxiety reported today. He is taking all medications as prescribed.  Observations/Objective:  Telephone Virtual Visit  Assessment and Plan:  1. Hospital discharge follow-up  2. Prepatellar bursitis of right knee - ibuprofen (ADVIL) 800 MG tablet; Take 1 tablet (800 mg total) by mouth every 8 (eight) hours as needed.  Dispense: 30 tablet; Refill: 3  3. Hx of BKA, right (HCC) - ibuprofen (ADVIL) 800 MG tablet; Take 1 tablet (800 mg total) by mouth every 8 (eight) hours as needed.  Dispense: 30 tablet; Refill: 3  4.  Pain and swelling of right knee - ibuprofen (ADVIL) 800 MG tablet; Take 1 tablet (800 mg total) by mouth every 8 (eight) hours as needed.  Dispense: 30 tablet; Refill: 3  5. Diabetes mellitus type 2 without complications, uncontrolled  (HCC) He will continue medication as prescribed, to decrease foods/beverages high in sugars and carbs and follow Heart Healthy or DASH diet. Increase physical activity to at least 30 minutes cardio exercise daily.   6. Hemoglobin A1c less than 7.0% Most recent Hgb A1c at 6.5. Monitor.   7. Hypertension, unspecified type He will continue to take medications as prescribed, to decrease high sodium intake, excessive alcohol intake, increase potassium intake, smoking cessation, and increase physical activity of at least 30 minutes of cardio activity daily. He will continue to follow Heart Healthy or DASH diet.  8. Follow up He will follow up in 6 months .   Meds ordered this encounter  Medications  . ibuprofen (ADVIL) 800 MG tablet    Sig: Take 1 tablet (800 mg total) by mouth every 8 (eight) hours as needed.    Dispense:  30 tablet    Refill:  3    No orders of the defined types were placed in this encounter.   Referral Orders  No referral(s) requested today    Raliegh Ip, MSN, ANE, FNP-BC [facility] [address] [place], [place] [address] [phone]  I discussed the assessment and treatment plan with the patient. The patient was provided an opportunity to ask questions and all were answered. The patient agreed with the plan and demonstrated an understanding of the instructions.   The patient was advised to call back or seek an in-person evaluation if the symptoms worsen or if the condition fails to improve as anticipated.  I provided 20 minutes of non-face-to-face time during this encounter.   Kallie Locks, FNP

## 2020-07-03 ENCOUNTER — Encounter: Payer: Self-pay | Admitting: Family Medicine

## 2020-07-09 ENCOUNTER — Ambulatory Visit: Payer: Medicare HMO | Admitting: Orthopedic Surgery

## 2020-07-12 ENCOUNTER — Ambulatory Visit: Payer: Medicare HMO | Admitting: Orthopedic Surgery

## 2020-07-19 ENCOUNTER — Ambulatory Visit (INDEPENDENT_AMBULATORY_CARE_PROVIDER_SITE_OTHER): Payer: Medicare HMO | Admitting: Orthopedic Surgery

## 2020-07-19 ENCOUNTER — Other Ambulatory Visit: Payer: Self-pay

## 2020-07-19 ENCOUNTER — Encounter: Payer: Self-pay | Admitting: Orthopedic Surgery

## 2020-07-19 DIAGNOSIS — L02415 Cutaneous abscess of right lower limb: Secondary | ICD-10-CM

## 2020-07-19 DIAGNOSIS — Z89512 Acquired absence of left leg below knee: Secondary | ICD-10-CM

## 2020-07-19 MED ORDER — DOXYCYCLINE HYCLATE 100 MG PO TABS: 100.0000 mg | ORAL_TABLET | Freq: Two times a day (BID) | ORAL | 0 refills | Status: DC
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
## 2020-07-23 ENCOUNTER — Other Ambulatory Visit: Payer: Self-pay | Admitting: Physician Assistant

## 2020-07-24 ENCOUNTER — Other Ambulatory Visit (HOSPITAL_COMMUNITY)
Admission: RE | Admit: 2020-07-24 | Discharge: 2020-07-24 | Disposition: A | Discharge status: Home / Self Care | Payer: Medicare HMO | Source: Ambulatory Visit | Attending: Orthopedic Surgery | Admitting: Orthopedic Surgery

## 2020-07-24 ENCOUNTER — Encounter (HOSPITAL_COMMUNITY): Payer: Self-pay | Admitting: Orthopedic Surgery

## 2020-07-24 DIAGNOSIS — Z01812 Encounter for preprocedural laboratory examination: Secondary | ICD-10-CM | POA: Diagnosis not present

## 2020-07-24 DIAGNOSIS — Z20822 Contact with and (suspected) exposure to covid-19: Secondary | ICD-10-CM | POA: Insufficient documentation

## 2020-07-24 LAB — SARS CORONAVIRUS 2 (TAT 6-24 HRS): SARS Coronavirus 2: NEGATIVE

## 2020-07-24 NOTE — Progress Notes (Signed)
[name] denies chest pain or shortness of breath. Patient was tested for Covid today and has been in quarantine since that time.  [name] has type II diabetes. Patient reports that fasting CBGs run 100-120. I instructed [name] to not take Novolog Insulin at bedtime tonight, in am if BCG is greater than 70 take 10 units of Lantus.  I instructed patient to check CBG after awaking and every 2 hours until arrival  to the hospital.  I Instructed patient if CBG is less than 70 to take 4 Glucose Tablets. Recheck CBG in 15 minutes then call pre- op desk at [phone] for further instructions. If scheduled to receive Insulin, do not take Insulin.

## 2020-07-24 NOTE — Anesthesia Preprocedure Evaluation (Addendum)
Anesthesia Evaluation  Patient identified by MRN, date of birth, ID band Patient awake    Reviewed: Allergy & Precautions, NPO status , Patient's Chart, lab work & pertinent test results  History of Anesthesia Complications Negative for: history of anesthetic complications  Airway Mallampati: II  TM Distance: >3 FB Neck ROM: Full    Dental no notable dental hx.    Pulmonary neg pulmonary ROS, Current Smoker and Patient abstained from smoking.,    Pulmonary exam normal        Cardiovascular hypertension, Pt. on medications + Peripheral Vascular Disease  Normal cardiovascular exam     Neuro/Psych negative neurological ROS  negative psych ROS   GI/Hepatic hiatal hernia, GERD  Medicated and Controlled,(+)     substance abuse  marijuana use,   Endo/Other  diabetes, Type 2, Insulin Dependent  Renal/GU negative Renal ROS  negative genitourinary   Musculoskeletal negative musculoskeletal ROS (+)   Abdominal   Peds  Hematology negative hematology ROS (+)   Anesthesia Other Findings Day of surgery medications reviewed with patient.  Reproductive/Obstetrics negative OB ROS                            Anesthesia Physical Anesthesia Plan  ASA: III  Anesthesia Plan: General   Post-op Pain Management: GA combined w/ Regional for post-op pain   Induction: Intravenous  PONV Risk Score and Plan: 1 and Treatment may vary due to age or medical condition, Ondansetron, Midazolam and Dexamethasone  Airway Management Planned: LMA  Additional Equipment: None  Intra-op Plan:   Post-operative Plan: Extubation in OR  Informed Consent: I have reviewed the patients History and Physical, chart, labs and discussed the procedure including the risks, benefits and alternatives for the proposed anesthesia with the patient or authorized representative who has indicated his/her understanding and acceptance.      Dental advisory given  Plan Discussed with: CRNA  Anesthesia Plan Comments:        Anesthesia Quick Evaluation

## 2020-07-25 ENCOUNTER — Encounter (HOSPITAL_COMMUNITY): Payer: Self-pay | Admitting: Orthopedic Surgery

## 2020-07-25 ENCOUNTER — Other Ambulatory Visit: Payer: Self-pay

## 2020-07-25 ENCOUNTER — Ambulatory Visit (HOSPITAL_COMMUNITY)
Admission: RE | Admit: 2020-07-25 | Discharge: 2020-07-25 | Disposition: A | Discharge status: Home / Self Care | Payer: Medicare HMO | Attending: Orthopedic Surgery | Admitting: Orthopedic Surgery

## 2020-07-25 ENCOUNTER — Encounter (HOSPITAL_COMMUNITY)
Admission: RE | Disposition: A | Discharge status: Home / Self Care | Payer: Self-pay | Source: Home / Self Care | Attending: Orthopedic Surgery

## 2020-07-25 ENCOUNTER — Ambulatory Visit (HOSPITAL_COMMUNITY): Payer: Medicare HMO | Admitting: Anesthesiology

## 2020-07-25 DIAGNOSIS — F17210 Nicotine dependence, cigarettes, uncomplicated: Secondary | ICD-10-CM | POA: Insufficient documentation

## 2020-07-25 DIAGNOSIS — E785 Hyperlipidemia, unspecified: Secondary | ICD-10-CM | POA: Diagnosis not present

## 2020-07-25 DIAGNOSIS — Z8249 Family history of ischemic heart disease and other diseases of the circulatory system: Secondary | ICD-10-CM | POA: Insufficient documentation

## 2020-07-25 DIAGNOSIS — T8743 Infection of amputation stump, right lower extremity: Secondary | ICD-10-CM | POA: Insufficient documentation

## 2020-07-25 DIAGNOSIS — T8781 Dehiscence of amputation stump: Secondary | ICD-10-CM | POA: Diagnosis not present

## 2020-07-25 DIAGNOSIS — R69 Illness, unspecified: Secondary | ICD-10-CM | POA: Diagnosis not present

## 2020-07-25 DIAGNOSIS — Z794 Long term (current) use of insulin: Secondary | ICD-10-CM | POA: Diagnosis not present

## 2020-07-25 DIAGNOSIS — E876 Hypokalemia: Secondary | ICD-10-CM | POA: Diagnosis not present

## 2020-07-25 DIAGNOSIS — G8918 Other acute postprocedural pain: Secondary | ICD-10-CM | POA: Diagnosis not present

## 2020-07-25 HISTORY — PX: STUMP REVISION: SHX6102

## 2020-07-25 LAB — COMPREHENSIVE METABOLIC PANEL
ALT: 21 U/L (ref 0–44)
AST: 21 U/L (ref 15–41)
Albumin: 3.7 g/dL (ref 3.5–5.0)
Alkaline Phosphatase: 77 U/L (ref 38–126)
Anion gap: 11 (ref 5–15)
BUN: 20 mg/dL (ref 6–20)
CO2: 25 mmol/L (ref 22–32)
Calcium: 9.5 mg/dL (ref 8.9–10.3)
Chloride: 102 mmol/L (ref 98–111)
Creatinine, Ser: 1.51 mg/dL — ABNORMAL HIGH (ref 0.61–1.24)
GFR, Estimated: 58 mL/min — ABNORMAL LOW (ref >60)
Glucose, Bld: 129 mg/dL — ABNORMAL HIGH (ref 70–99)
Potassium: 4 mmol/L (ref 3.5–5.1)
Sodium: 138 mmol/L (ref 135–145)
Total Bilirubin: 0.7 mg/dL (ref 0.3–1.2)
Total Protein: 7.7 g/dL (ref 6.5–8.1)

## 2020-07-25 LAB — CBC
HCT: 39.6 % (ref 39.0–52.0)
Hemoglobin: 12.9 g/dL — ABNORMAL LOW (ref 13.0–17.0)
MCH: 30.6 pg (ref 26.0–34.0)
MCHC: 32.6 g/dL (ref 30.0–36.0)
MCV: 93.8 fL (ref 80.0–100.0)
Platelets: 251 10*3/uL (ref 150–400)
RBC: 4.22 MIL/uL (ref 4.22–5.81)
RDW: 13.1 % (ref 11.5–15.5)
WBC: 7.6 10*3/uL (ref 4.0–10.5)
nRBC: 0 % (ref 0.0–0.2)

## 2020-07-25 LAB — GLUCOSE, CAPILLARY
Glucose-Capillary: 129 mg/dL — ABNORMAL HIGH (ref 70–99)
Glucose-Capillary: 147 mg/dL — ABNORMAL HIGH (ref 70–99)

## 2020-07-25 SURGERY — REVISION, AMPUTATION SITE
Anesthesia: General | Site: Knee | Laterality: Right

## 2020-07-25 MED ORDER — OXYCODONE-ACETAMINOPHEN 5-325 MG PO TABS: 1.0000 | ORAL_TABLET | ORAL | 0 refills | Status: DC | PRN

## 2020-07-25 MED ORDER — SUCCINYLCHOLINE CHLORIDE 20 MG/ML IJ SOLN
INTRAMUSCULAR | Status: DC | PRN
  Administered 2020-07-25: 140 mg via INTRAVENOUS

## 2020-07-25 MED ORDER — MIDAZOLAM HCL 2 MG/2ML IJ SOLN
INTRAMUSCULAR | Status: AC
  Administered 2020-07-25: 1 mg
  Filled 2020-07-25: qty 2

## 2020-07-25 MED ORDER — PROMETHAZINE HCL 25 MG/ML IJ SOLN
6.2500 mg | INTRAMUSCULAR | Status: DC | PRN
Start: 2020-07-25 — End: 2020-07-25

## 2020-07-25 MED ORDER — BUPIVACAINE-EPINEPHRINE (PF) 0.5% -1:200000 IJ SOLN
INTRAMUSCULAR | Status: DC | PRN
  Administered 2020-07-25: 10 mL via PERINEURAL
  Administered 2020-07-25: 20 mL via PERINEURAL

## 2020-07-25 MED ORDER — PROPOFOL 10 MG/ML IV BOLUS
INTRAVENOUS | Status: AC
  Filled 2020-07-25: qty 20

## 2020-07-25 MED ORDER — CHLORHEXIDINE GLUCONATE 0.12 % MT SOLN
OROMUCOSAL | Status: AC
  Administered 2020-07-25: 15 mL
  Filled 2020-07-25: qty 15

## 2020-07-25 MED ORDER — LACTATED RINGERS IV SOLN: INTRAVENOUS | Status: DC

## 2020-07-25 MED ORDER — CEFAZOLIN SODIUM-DEXTROSE 2-4 GM/100ML-% IV SOLN
2.0000 g | INTRAVENOUS | Status: AC
  Administered 2020-07-25: 2 g via INTRAVENOUS

## 2020-07-25 MED ORDER — PHENYLEPHRINE 40 MCG/ML (10ML) SYRINGE FOR IV PUSH (FOR BLOOD PRESSURE SUPPORT)
PREFILLED_SYRINGE | INTRAVENOUS | Status: DC | PRN
  Administered 2020-07-25: 80 ug via INTRAVENOUS

## 2020-07-25 MED ORDER — PHENYLEPHRINE HCL-NACL 10-0.9 MG/250ML-% IV SOLN
INTRAVENOUS | Status: DC | PRN
  Administered 2020-07-25: 30 ug/min via INTRAVENOUS

## 2020-07-25 MED ORDER — PROPOFOL 10 MG/ML IV BOLUS
INTRAVENOUS | Status: DC | PRN
  Administered 2020-07-25: 100 mg via INTRAVENOUS
  Administered 2020-07-25: 200 mg via INTRAVENOUS
  Administered 2020-07-25: 100 mg via INTRAVENOUS

## 2020-07-25 MED ORDER — ACETAMINOPHEN 500 MG PO TABS
1000.0000 mg | ORAL_TABLET | Freq: Once | ORAL | Status: AC
  Administered 2020-07-25: 1000 mg via ORAL
  Filled 2020-07-25: qty 2

## 2020-07-25 MED ORDER — CLONIDINE HCL (ANALGESIA) 100 MCG/ML EP SOLN
EPIDURAL | Status: DC | PRN
  Administered 2020-07-25: 67 ug
  Administered 2020-07-25: 33 ug

## 2020-07-25 MED ORDER — LIDOCAINE 2% (20 MG/ML) 5 ML SYRINGE
INTRAMUSCULAR | Status: DC | PRN
  Administered 2020-07-25: 100 mg via INTRAVENOUS

## 2020-07-25 MED ORDER — OXYCODONE HCL 5 MG/5ML PO SOLN: 5.0000 mg | Freq: Once | ORAL | Status: DC | PRN

## 2020-07-25 MED ORDER — FENTANYL CITRATE (PF) 100 MCG/2ML IJ SOLN
INTRAMUSCULAR | Status: AC
  Administered 2020-07-25: 50 ug
  Filled 2020-07-25: qty 2

## 2020-07-25 MED ORDER — CEFAZOLIN SODIUM-DEXTROSE 2-4 GM/100ML-% IV SOLN
INTRAVENOUS | Status: AC
  Filled 2020-07-25: qty 100

## 2020-07-25 MED ORDER — ONDANSETRON HCL 4 MG/2ML IJ SOLN
INTRAMUSCULAR | Status: DC | PRN
  Administered 2020-07-25: 4 mg via INTRAVENOUS

## 2020-07-25 MED ORDER — OXYCODONE HCL 5 MG PO TABS: 5.0000 mg | ORAL_TABLET | Freq: Once | ORAL | Status: DC | PRN

## 2020-07-25 MED ORDER — FENTANYL CITRATE (PF) 100 MCG/2ML IJ SOLN: 25.0000 ug | INTRAMUSCULAR | Status: DC | PRN

## 2020-07-25 SURGICAL SUPPLY — 32 items
BLADE SAW RECIP 87.9 MT (BLADE) IMPLANT
BLADE SURG 21 STRL SS (BLADE) ×3 IMPLANT
CANISTER WOUND CARE 500ML ATS (WOUND CARE) ×3 IMPLANT
COVER SURGICAL LIGHT HANDLE (MISCELLANEOUS) ×3 IMPLANT
COVER WAND RF STERILE (DRAPES) ×3 IMPLANT
DRAPE DERMATAC (DRAPES) ×4 IMPLANT
DRAPE EXTREMITY T 121X128X90 (DISPOSABLE) ×3 IMPLANT
DRAPE HALF SHEET 40X57 (DRAPES) ×3 IMPLANT
DRAPE INCISE IOBAN 66X45 STRL (DRAPES) ×3 IMPLANT
DRAPE U-SHAPE 47X51 STRL (DRAPES) ×6 IMPLANT
DRESSING PREVENA PLUS CUSTOM (GAUZE/BANDAGES/DRESSINGS) ×1 IMPLANT
DRSG PREVENA PLUS CUSTOM (GAUZE/BANDAGES/DRESSINGS)
DURAPREP 26ML APPLICATOR (WOUND CARE) ×3 IMPLANT
ELECT REM PT RETURN 9FT ADLT (ELECTROSURGICAL) ×3
ELECTRODE REM PT RTRN 9FT ADLT (ELECTROSURGICAL) ×1 IMPLANT
GLOVE BIOGEL PI IND STRL 9 (GLOVE) ×1 IMPLANT
GLOVE BIOGEL PI INDICATOR 9 (GLOVE) ×2
GLOVE SURG ORTHO 9.0 STRL STRW (GLOVE) ×3 IMPLANT
GOWN STRL REUS W/ TWL XL LVL3 (GOWN DISPOSABLE) ×2 IMPLANT
GOWN STRL REUS W/TWL XL LVL3 (GOWN DISPOSABLE) ×6
KIT BASIN OR (CUSTOM PROCEDURE TRAY) ×3 IMPLANT
KIT TURNOVER KIT B (KITS) ×3 IMPLANT
MANIFOLD NEPTUNE II (INSTRUMENTS) ×3 IMPLANT
NS IRRIG 1000ML POUR BTL (IV SOLUTION) ×3 IMPLANT
PACK GENERAL/GYN (CUSTOM PROCEDURE TRAY) ×3 IMPLANT
PAD ARMBOARD 7.5X6 YLW CONV (MISCELLANEOUS) ×3 IMPLANT
PREVENA RESTOR ARTHOFORM 46X30 (CANNISTER) ×3 IMPLANT
STAPLER VISISTAT 35W (STAPLE) IMPLANT
SUT ETHILON 2 0 PSLX (SUTURE) ×6 IMPLANT
SUT SILK 2 0 (SUTURE)
SUT SILK 2-0 18XBRD TIE 12 (SUTURE) IMPLANT
TOWEL GREEN STERILE (TOWEL DISPOSABLE) ×3 IMPLANT

## 2020-07-25 NOTE — Interval H&P Note (Signed)
History and Physical Interval Note:  07/25/2020 8:19 AM  [name]  has presented today for surgery, with the diagnosis of Infected Right Below Knee Amptuation.  The various methods of treatment have been discussed with the patient and family. After consideration of risks, benefits and other options for treatment, the patient has consented to  Procedure(s): REVISION RIGHT BELOW KNEE AMPUTATION (Right) as a surgical intervention.  The patient's history has been reviewed, patient examined, no change in status, stable for surgery.  I have reviewed the patient's chart and labs.  Questions were answered to the patient's satisfaction.     Nadara Mustard

## 2020-07-25 NOTE — Anesthesia Procedure Notes (Signed)
Anesthesia Regional Block: Popliteal block   Pre-Anesthetic Checklist: ,, timeout performed, Correct Patient, Correct Site, Correct Laterality, Correct Procedure, Correct Position, site marked, Risks and benefits discussed, pre-op evaluation,  At surgeon's request and post-op pain management  Laterality: Right  Prep: Maximum Sterile Barrier Precautions used, chloraprep       Needles:  Injection technique: Single-shot  Needle Type: Echogenic Stimulator Needle     Needle Length: 9cm  Needle Gauge: 22     Additional Needles:   Procedures:,,,, ultrasound used (permanent image in chart),,,,  Narrative:  Start time: 07/25/2020 11:06 AM End time: 07/25/2020 11:08 AM Injection made incrementally with aspirations every 5 mL.  Performed by: Personally  Anesthesiologist: Kaylyn Layer, MD  Additional Notes: Risks, benefits, and alternative discussed. Patient gave consent for procedure. Patient prepped and draped in sterile fashion. Sedation administered, patient remains easily responsive to voice. Relevant anatomy identified with ultrasound guidance. Local anesthetic given in 5cc increments with no signs or symptoms of intravascular injection. No pain or paraesthesias with injection. Patient monitored throughout procedure with signs of LAST or immediate complications. Tolerated well. Ultrasound image placed in chart.  Amalia Greenhouse, MD
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
## 2020-07-25 NOTE — Anesthesia Procedure Notes (Signed)
Anesthesia Regional Block: Adductor canal block   Pre-Anesthetic Checklist: ,, timeout performed, Correct Patient, Correct Site, Correct Laterality, Correct Procedure, Correct Position, site marked, Risks and benefits discussed, pre-op evaluation,  At surgeon's request and post-op pain management  Laterality: Right  Prep: Maximum Sterile Barrier Precautions used, chloraprep       Needles:  Injection technique: Single-shot  Needle Type: Echogenic Stimulator Needle     Needle Length: 9cm  Needle Gauge: 22     Additional Needles:   Procedures:,,,, ultrasound used (permanent image in chart),,,,  Narrative:  Start time: 07/25/2020 11:04 AM End time: 07/25/2020 11:06 AM Injection made incrementally with aspirations every 5 mL.  Performed by: Personally  Anesthesiologist: Kaylyn Layer, MD  Additional Notes: Risks, benefits, and alternative discussed. Patient gave consent for procedure. Patient prepped and draped in sterile fashion. Sedation administered, patient remains easily responsive to voice. Relevant anatomy identified with ultrasound guidance. Local anesthetic given in 5cc increments with no signs or symptoms of intravascular injection. No pain or paraesthesias with injection. Patient monitored throughout procedure with signs of LAST or immediate complications. Tolerated well. Ultrasound image placed in chart.  Amalia Greenhouse, MD

## 2020-07-25 NOTE — Anesthesia Procedure Notes (Signed)
Procedure Name: Intubation Date/Time: 07/25/2020 11:30 AM Performed by: Amadeo Garnet, CRNA Pre-anesthesia Checklist: Patient identified, Emergency Drugs available, Suction available and Patient being monitored Patient Re-evaluated:Patient Re-evaluated prior to induction Oxygen Delivery Method: Circle system utilized Preoxygenation: Pre-oxygenation with 100% oxygen Induction Type: IV induction Ventilation: Mask ventilation without difficulty Laryngoscope Size: Mac and 4 Grade View: Grade I Tube type: Oral Tube size: 7.5 mm Number of attempts: 1 Airway Equipment and Method: Stylet and Oral airway Placement Confirmation: ETT inserted through vocal cords under direct vision,  positive ETCO2 and breath sounds checked- equal and bilateral Secured at: 23 cm Tube secured with: Tape Dental Injury: Teeth and Oropharynx as per pre-operative assessment

## 2020-07-25 NOTE — Anesthesia Postprocedure Evaluation (Signed)
Anesthesia Post Note  Patient: [name]  Procedure(s) Performed: REVISION RIGHT BELOW KNEE AMPUTATION (Right Knee)     Patient location during evaluation: PACU Anesthesia Type: General Level of consciousness: awake and alert and oriented Pain management: pain level controlled Vital Signs Assessment: post-procedure vital signs reviewed and stable Respiratory status: spontaneous breathing, nonlabored ventilation and respiratory function stable Cardiovascular status: blood pressure returned to baseline Postop Assessment: no apparent nausea or vomiting Anesthetic complications: no   No complications documented.  Last Vitals:  Vitals:   07/25/20 1250 07/25/20 1310  BP: 119/78 118/81  Pulse: 71 67  Resp: 16 18  Temp:    SpO2: 98% 97%    Last Pain:  Vitals:   07/25/20 1250  TempSrc:   PainSc: 0-No pain                 Kaylyn Layer

## 2020-07-25 NOTE — Op Note (Signed)
07/25/2020  12:06 PM  PATIENT:  [name]    PRE-OPERATIVE DIAGNOSIS:  Infected Right Below Knee Amptuation  POST-OPERATIVE DIAGNOSIS:  Same  PROCEDURE:  REVISION RIGHT BELOW KNEE AMPUTATION Application of Prevena customizable and arthroform wound VAC.  SURGEON:  Nadara Mustard, MD  PHYSICIAN ASSISTANT:None ANESTHESIA:   General  PREOPERATIVE INDICATIONS:  [name] is a  45 y.o. male with a diagnosis of Infected Right Below Knee Amptuation who failed conservative measures and elected for surgical management.    The risks benefits and alternatives were discussed with the patient preoperatively including but not limited to the risks of infection, bleeding, nerve injury, cardiopulmonary complications, the need for revision surgery, among others, and the patient was willing to proceed.  OPERATIVE IMPLANTS: Praveena customizable and arthroform wound VAC.  @ENCIMAGES @  OPERATIVE FINDINGS: Infected tissue extended down to the distal tibia.  Bone soft tissue and skin was sent for cultures.  OPERATIVE PROCEDURE: Patient was brought the operating room and underwent a general anesthetic.  After adequate levels anesthesia were obtained patient's right lower extremity was prepped using DuraPrep draped into a sterile field a timeout was called.  Elliptical incision was made around the distal ulcer the ulcer extended down to bone the distal centimeter of tibia and fibula were resected the bone and infected margins were sent for cultures.  The wound was irrigated with normal saline electrocautery was used hemostasis the muscle had good color and contractility.  The wound margins were clear.  The incision was closed using 2-0 nylon a Prevena customizable and form wound VAC was applied this had a good suction fit patient was extubated taken the PACU in stable condition.  Debridement type: Excisional Debridement  Side: right  Body Location: Right transtibial amputation  Tools used  for debridement: scalpel and rongeur and saw  Pre-debridement Wound size (cm):   Length: 0.1         Width: 0.1      Depth: 0.1   Post-debridement Wound size (cm):   Length: 10        Width: 5     Depth: 5   Debridement depth beyond dead/damaged tissue down to healthy viable tissue: yes  Tissue layer involved: skin, subcutaneous tissue, muscle / fascia, bone  Nature of tissue removed: Slough, Necrotic, Devitalized Tissue, Non-viable tissue and Purulence  Irrigation volume: 1 liter     Irrigation fluid type: Normal Saline      DISCHARGE PLANNING:  Antibiotic duration: Preoperative antibiotics tissue culture sent will place on oral antibiotics based on tissue culture sensitivities  Weightbearing: Nonweightbearing on the right  Pain medication: Prescription called in for Percocet  Dressing care/ Wound VAC: Continue wound VAC for 1 week  Ambulatory devices: Walker or crutches  Discharge to: Home.  Follow-up: In the office 1 week post operative.

## 2020-07-25 NOTE — Transfer of Care (Signed)
Immediate Anesthesia Transfer of Care Note  Patient: [name]  Procedure(s) Performed: REVISION RIGHT BELOW KNEE AMPUTATION (Right Knee)  Patient Location: PACU  Anesthesia Type:GA combined with regional for post-op pain  Level of Consciousness: awake, alert  and oriented  Airway & Oxygen Therapy: Patient Spontanous Breathing and Patient connected to face mask oxygen  Post-op Assessment: Report given to RN, Post -op Vital signs reviewed and stable and Patient moving all extremities  Post vital signs: Reviewed and stable  Last Vitals:  Vitals Value Taken Time  BP 127/79 07/25/20 1205  Temp    Pulse 74 07/25/20 1208  Resp 18 07/25/20 1208  SpO2 99 % 07/25/20 1208  Vitals shown include unvalidated device data.  Last Pain:  Vitals:   07/25/20 0829  TempSrc:   PainSc: 2       Patients Stated Pain Goal: 3 (07/25/20 0829)  Complications: No complications documented.

## 2020-07-26 ENCOUNTER — Encounter (HOSPITAL_COMMUNITY): Payer: Self-pay | Admitting: Orthopedic Surgery

## 2020-07-30 ENCOUNTER — Other Ambulatory Visit: Payer: Self-pay | Admitting: Orthopedic Surgery

## 2020-07-30 MED ORDER — AMOXICILLIN-POT CLAVULANATE 875-125 MG PO TABS: 1.0000 | ORAL_TABLET | Freq: Two times a day (BID) | ORAL | 0 refills | Status: DC

## 2020-07-31 LAB — AEROBIC/ANAEROBIC CULTURE W GRAM STAIN (SURGICAL/DEEP WOUND)

## 2020-08-01 ENCOUNTER — Other Ambulatory Visit: Payer: Self-pay | Admitting: Physician Assistant

## 2020-08-01 MED ORDER — LEVOFLOXACIN 500 MG PO TABS: 500.0000 mg | ORAL_TABLET | Freq: Every day | ORAL | 0 refills | Status: DC

## 2020-08-02 ENCOUNTER — Ambulatory Visit (INDEPENDENT_AMBULATORY_CARE_PROVIDER_SITE_OTHER): Payer: Medicare HMO | Admitting: Orthopedic Surgery

## 2020-08-02 DIAGNOSIS — L02415 Cutaneous abscess of right lower limb: Secondary | ICD-10-CM

## 2020-08-03 ENCOUNTER — Ambulatory Visit: Payer: Medicare HMO

## 2020-08-03 ENCOUNTER — Encounter: Payer: Self-pay | Admitting: Orthopedic Surgery

## 2020-08-03 ENCOUNTER — Telehealth: Payer: Self-pay | Admitting: Orthopedic Surgery

## 2020-08-03 DIAGNOSIS — Z89512 Acquired absence of left leg below knee: Secondary | ICD-10-CM

## 2020-08-03 MED ORDER — OXYCODONE-ACETAMINOPHEN 5-325 MG PO TABS: 1.0000 | ORAL_TABLET | ORAL | 0 refills | Status: DC | PRN

## 2020-08-03 NOTE — Telephone Encounter (Signed)
Patient called a call back for an appt. Patient has open wound and needs to be seen right away. Please call pt at [phone].

## 2020-08-03 NOTE — Telephone Encounter (Signed)
Called pt sw Dr. Lajoyce Corners having him come in for a nurse only visit and eval the incision take photos and re wrap limb. May have to post for revision surgery next week.

## 2020-08-03 NOTE — Progress Notes (Signed)
Patient is 1 week s/p right below the knee amputation revision. Patient was in the office yesterday for wound vac removal. He states that his incision is opening and bleeding. Removed the dressing and two small openings on both ends of the incision, some swelling and blood drainage. Reapplied dry dressing and ace bandage. Pt wearing shrinker on top of this. Advised to change dressing daily and  To keep appt for next Thursday with Dr. Lajoyce Corners. Patient requested refill of Percocet 5/325 last refill was given on 07/25/20 #30. This was provided by Dr. Lajoyce Corners. Patient voiced understanding of plan and will call with any questions.  Analisse Randle, RMA, [employer]
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
## 2020-08-09 ENCOUNTER — Ambulatory Visit (INDEPENDENT_AMBULATORY_CARE_PROVIDER_SITE_OTHER): Payer: Medicare HMO | Admitting: Physician Assistant

## 2020-08-09 ENCOUNTER — Encounter: Payer: Self-pay | Admitting: Orthopedic Surgery

## 2020-08-09 DIAGNOSIS — Z89512 Acquired absence of left leg below knee: Secondary | ICD-10-CM

## 2020-08-09 MED ORDER — LEVOFLOXACIN 500 MG PO TABS: 500.0000 mg | ORAL_TABLET | Freq: Every day | ORAL | 0 refills | Status: AC

## 2020-08-09 MED ORDER — OXYCODONE-ACETAMINOPHEN 5-325 MG PO TABS: 1.0000 | ORAL_TABLET | ORAL | 0 refills | Status: DC | PRN
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
## 2020-08-15 ENCOUNTER — Ambulatory Visit (INDEPENDENT_AMBULATORY_CARE_PROVIDER_SITE_OTHER): Payer: Medicare HMO | Admitting: Physician Assistant

## 2020-08-15 ENCOUNTER — Encounter: Payer: Self-pay | Admitting: Physician Assistant

## 2020-08-15 DIAGNOSIS — Z89512 Acquired absence of left leg below knee: Secondary | ICD-10-CM
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
## 2020-08-16 ENCOUNTER — Other Ambulatory Visit: Payer: Self-pay | Admitting: Physician Assistant

## 2020-08-16 ENCOUNTER — Telehealth: Payer: Self-pay | Admitting: Physician Assistant

## 2020-08-16 MED ORDER — OXYCODONE-ACETAMINOPHEN 5-325 MG PO TABS: 1.0000 | ORAL_TABLET | Freq: Four times a day (QID) | ORAL | 0 refills | Status: DC | PRN

## 2020-08-16 NOTE — Telephone Encounter (Signed)
I called and lm on vm to advise pt of message below.  

## 2020-08-16 NOTE — Telephone Encounter (Signed)
Patient called requesting a refill of oxycodone. Please send to pharmacy on file. Patient phone number is [phone].

## 2020-08-16 NOTE — Telephone Encounter (Signed)
Pt is s/p revision right BKA 07/25/20 requesting refill on Oxycodone 5/325 last refill was 08/09/20 #30 please advise.

## 2020-08-16 NOTE — Telephone Encounter (Signed)
Done but changed to 1 every 6 hours

## 2020-08-22 ENCOUNTER — Ambulatory Visit (INDEPENDENT_AMBULATORY_CARE_PROVIDER_SITE_OTHER): Payer: Medicare HMO | Admitting: Physician Assistant

## 2020-08-22 ENCOUNTER — Encounter: Payer: Self-pay | Admitting: Physician Assistant

## 2020-08-22 ENCOUNTER — Other Ambulatory Visit: Payer: Self-pay

## 2020-08-22 DIAGNOSIS — Z89511 Acquired absence of right leg below knee: Secondary | ICD-10-CM
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
## 2020-08-29 ENCOUNTER — Telehealth: Payer: Self-pay | Admitting: Orthopedic Surgery

## 2020-08-29 ENCOUNTER — Other Ambulatory Visit: Payer: Self-pay | Admitting: Physician Assistant

## 2020-08-29 MED ORDER — OXYCODONE-ACETAMINOPHEN 5-325 MG PO TABS: 1.0000 | ORAL_TABLET | Freq: Four times a day (QID) | ORAL | 0 refills | Status: DC | PRN

## 2020-08-29 NOTE — Telephone Encounter (Signed)
Pt called and would like a medication refill on percocet.

## 2020-08-29 NOTE — Telephone Encounter (Signed)
done

## 2020-09-05 ENCOUNTER — Encounter: Payer: Self-pay | Admitting: Physician Assistant

## 2020-09-05 ENCOUNTER — Ambulatory Visit (INDEPENDENT_AMBULATORY_CARE_PROVIDER_SITE_OTHER): Payer: Medicare HMO | Admitting: Physician Assistant

## 2020-09-05 DIAGNOSIS — Z89511 Acquired absence of right leg below knee: Secondary | ICD-10-CM

## 2020-09-05 MED ORDER — DOXYCYCLINE HYCLATE 100 MG PO TABS: 100.0000 mg | ORAL_TABLET | Freq: Two times a day (BID) | ORAL | 0 refills | Status: DC

## 2020-09-05 NOTE — Addendum Note (Signed)
Addended by: Polly Cobia on: 09/05/2020 10:39 AM   Modules accepted: Orders
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
## 2020-09-11 ENCOUNTER — Emergency Department (HOSPITAL_COMMUNITY)
Admission: EM | Admit: 2020-09-11 | Discharge: 2020-09-11 | Disposition: A | Discharge status: Home / Self Care | Payer: Medicare HMO | Attending: Emergency Medicine | Admitting: Emergency Medicine

## 2020-09-11 ENCOUNTER — Emergency Department (HOSPITAL_COMMUNITY): Payer: Medicare HMO

## 2020-09-11 DIAGNOSIS — F17210 Nicotine dependence, cigarettes, uncomplicated: Secondary | ICD-10-CM | POA: Insufficient documentation

## 2020-09-11 DIAGNOSIS — R509 Fever, unspecified: Secondary | ICD-10-CM | POA: Insufficient documentation

## 2020-09-11 DIAGNOSIS — K219 Gastro-esophageal reflux disease without esophagitis: Secondary | ICD-10-CM | POA: Insufficient documentation

## 2020-09-11 DIAGNOSIS — R109 Unspecified abdominal pain: Secondary | ICD-10-CM | POA: Diagnosis not present

## 2020-09-11 DIAGNOSIS — R69 Illness, unspecified: Secondary | ICD-10-CM | POA: Diagnosis not present

## 2020-09-11 DIAGNOSIS — Z794 Long term (current) use of insulin: Secondary | ICD-10-CM | POA: Insufficient documentation

## 2020-09-11 DIAGNOSIS — E1122 Type 2 diabetes mellitus with diabetic chronic kidney disease: Secondary | ICD-10-CM | POA: Insufficient documentation

## 2020-09-11 DIAGNOSIS — E1142 Type 2 diabetes mellitus with diabetic polyneuropathy: Secondary | ICD-10-CM | POA: Insufficient documentation

## 2020-09-11 DIAGNOSIS — I129 Hypertensive chronic kidney disease with stage 1 through stage 4 chronic kidney disease, or unspecified chronic kidney disease: Secondary | ICD-10-CM | POA: Insufficient documentation

## 2020-09-11 DIAGNOSIS — Z79899 Other long term (current) drug therapy: Secondary | ICD-10-CM | POA: Diagnosis not present

## 2020-09-11 DIAGNOSIS — N183 Chronic kidney disease, stage 3 unspecified: Secondary | ICD-10-CM | POA: Diagnosis not present

## 2020-09-11 DIAGNOSIS — R112 Nausea with vomiting, unspecified: Secondary | ICD-10-CM | POA: Insufficient documentation

## 2020-09-11 DIAGNOSIS — R1084 Generalized abdominal pain: Secondary | ICD-10-CM | POA: Insufficient documentation

## 2020-09-11 LAB — CBC WITH DIFFERENTIAL/PLATELET
Abs Immature Granulocytes: 0.04 10*3/uL (ref 0.00–0.07)
Basophils Absolute: 0 10*3/uL (ref 0.0–0.1)
Basophils Relative: 0 %
Eosinophils Absolute: 0.1 10*3/uL (ref 0.0–0.5)
Eosinophils Relative: 1 %
HCT: 38.8 % — ABNORMAL LOW (ref 39.0–52.0)
Hemoglobin: 12.9 g/dL — ABNORMAL LOW (ref 13.0–17.0)
Immature Granulocytes: 0 %
Lymphocytes Relative: 10 %
Lymphs Abs: 0.9 10*3/uL (ref 0.7–4.0)
MCH: 30.1 pg (ref 26.0–34.0)
MCHC: 33.2 g/dL (ref 30.0–36.0)
MCV: 90.7 fL (ref 80.0–100.0)
Monocytes Absolute: 0.4 10*3/uL (ref 0.1–1.0)
Monocytes Relative: 4 %
Neutro Abs: 8 10*3/uL — ABNORMAL HIGH (ref 1.7–7.7)
Neutrophils Relative %: 85 %
Platelets: 268 10*3/uL (ref 150–400)
RBC: 4.28 MIL/uL (ref 4.22–5.81)
RDW: 12.8 % (ref 11.5–15.5)
WBC: 9.5 10*3/uL (ref 4.0–10.5)
nRBC: 0 % (ref 0.0–0.2)

## 2020-09-11 LAB — URINALYSIS, ROUTINE W REFLEX MICROSCOPIC
Bacteria, UA: NONE SEEN
Bilirubin Urine: NEGATIVE
Glucose, UA: NEGATIVE mg/dL
Hgb urine dipstick: NEGATIVE
Ketones, ur: NEGATIVE mg/dL
Leukocytes,Ua: NEGATIVE
Nitrite: NEGATIVE
Protein, ur: 100 mg/dL — AB
Specific Gravity, Urine: 1.016 (ref 1.005–1.030)
pH: 6 (ref 5.0–8.0)

## 2020-09-11 LAB — COMPREHENSIVE METABOLIC PANEL
ALT: 15 U/L (ref 0–44)
AST: 18 U/L (ref 15–41)
Albumin: 3.8 g/dL (ref 3.5–5.0)
Alkaline Phosphatase: 83 U/L (ref 38–126)
Anion gap: 10 (ref 5–15)
BUN: 19 mg/dL (ref 6–20)
CO2: 22 mmol/L (ref 22–32)
Calcium: 9.4 mg/dL (ref 8.9–10.3)
Chloride: 104 mmol/L (ref 98–111)
Creatinine, Ser: 1.65 mg/dL — ABNORMAL HIGH (ref 0.61–1.24)
GFR, Estimated: 53 mL/min — ABNORMAL LOW (ref >60)
Glucose, Bld: 181 mg/dL — ABNORMAL HIGH (ref 70–99)
Potassium: 3.6 mmol/L (ref 3.5–5.1)
Sodium: 136 mmol/L (ref 135–145)
Total Bilirubin: 0.5 mg/dL (ref 0.3–1.2)
Total Protein: 8.1 g/dL (ref 6.5–8.1)

## 2020-09-11 LAB — LIPASE, BLOOD: Lipase: 148 U/L — ABNORMAL HIGH (ref 11–51)

## 2020-09-11 LAB — LACTIC ACID, PLASMA: Lactic Acid, Venous: 1.3 mmol/L (ref 0.5–1.9)

## 2020-09-11 MED ORDER — FAMOTIDINE IN NACL 20-0.9 MG/50ML-% IV SOLN
20.0000 mg | Freq: Once | INTRAVENOUS | Status: AC
  Administered 2020-09-11: 20 mg via INTRAVENOUS
  Filled 2020-09-11: qty 50

## 2020-09-11 MED ORDER — ONDANSETRON 4 MG PO TBDP: 4.0000 mg | ORAL_TABLET | Freq: Three times a day (TID) | ORAL | 0 refills | Status: DC | PRN

## 2020-09-11 MED ORDER — ONDANSETRON HCL 4 MG/2ML IJ SOLN
4.0000 mg | Freq: Once | INTRAMUSCULAR | Status: AC
  Administered 2020-09-11: 4 mg via INTRAVENOUS
  Filled 2020-09-11: qty 2

## 2020-09-11 MED ORDER — MORPHINE SULFATE (PF) 4 MG/ML IV SOLN
4.0000 mg | Freq: Once | INTRAVENOUS | Status: AC
Start: 2020-09-11 — End: 2020-09-11
  Administered 2020-09-11: 4 mg via INTRAVENOUS
  Filled 2020-09-11: qty 1

## 2020-09-11 MED ORDER — OMEPRAZOLE MAGNESIUM 20 MG PO TBEC: 20.0000 mg | DELAYED_RELEASE_TABLET | Freq: Every day | ORAL | 0 refills | Status: DC

## 2020-09-11 MED ORDER — SODIUM CHLORIDE 0.9 % IV BOLUS
1000.0000 mL | Freq: Once | INTRAVENOUS | Status: AC
  Administered 2020-09-11: 1000 mL via INTRAVENOUS

## 2020-09-11 MED ORDER — MORPHINE SULFATE (PF) 4 MG/ML IV SOLN
4.0000 mg | Freq: Once | INTRAVENOUS | Status: AC
  Administered 2020-09-11: 4 mg via INTRAVENOUS
  Filled 2020-09-11: qty 1

## 2020-09-11 MED ORDER — IOHEXOL 300 MG/ML  SOLN
100.0000 mL | Freq: Once | INTRAMUSCULAR | Status: AC | PRN
  Administered 2020-09-11: 100 mL via INTRAVENOUS

## 2020-09-11 NOTE — ED Triage Notes (Signed)
Pt stated he has abdominal pain that has lasted for 2 days. Pt states he vomited atleast 20x. Pt had a recent surgery and had an infection which was treated.

## 2020-09-11 NOTE — Discharge Instructions (Addendum)
You were seen in the emergency department for abdominal pain nausea and vomiting.  You had lab work that did not show any obvious abnormalities other than elevated lipase. You had a CAT scan of your abdomen and pelvis that did not show any specific findings.  We are prescribing you some nausea and acid medication.  Please schedule follow-up appointment with your primary care doctor.  Return to the emergency department if any worsening or concerning symptoms
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
## 2020-09-11 NOTE — ED Notes (Signed)
Pt. Tolerated PO Challenge well.

## 2020-09-13 ENCOUNTER — Ambulatory Visit: Payer: Medicare HMO | Admitting: Orthopedic Surgery

## 2020-09-13 ENCOUNTER — Other Ambulatory Visit: Payer: Self-pay

## 2020-09-13 ENCOUNTER — Emergency Department (HOSPITAL_COMMUNITY)
Admission: EM | Admit: 2020-09-13 | Discharge: 2020-09-13 | Disposition: A | Discharge status: Home / Self Care | Payer: Medicare HMO | Attending: Emergency Medicine | Admitting: Emergency Medicine

## 2020-09-13 ENCOUNTER — Emergency Department (HOSPITAL_COMMUNITY): Payer: Medicare HMO

## 2020-09-13 DIAGNOSIS — D631 Anemia in chronic kidney disease: Secondary | ICD-10-CM | POA: Diagnosis not present

## 2020-09-13 DIAGNOSIS — Z794 Long term (current) use of insulin: Secondary | ICD-10-CM | POA: Insufficient documentation

## 2020-09-13 DIAGNOSIS — R112 Nausea with vomiting, unspecified: Secondary | ICD-10-CM | POA: Diagnosis present

## 2020-09-13 DIAGNOSIS — E1122 Type 2 diabetes mellitus with diabetic chronic kidney disease: Secondary | ICD-10-CM | POA: Insufficient documentation

## 2020-09-13 DIAGNOSIS — I129 Hypertensive chronic kidney disease with stage 1 through stage 4 chronic kidney disease, or unspecified chronic kidney disease: Secondary | ICD-10-CM | POA: Insufficient documentation

## 2020-09-13 DIAGNOSIS — R1013 Epigastric pain: Secondary | ICD-10-CM | POA: Diagnosis not present

## 2020-09-13 DIAGNOSIS — F17210 Nicotine dependence, cigarettes, uncomplicated: Secondary | ICD-10-CM | POA: Insufficient documentation

## 2020-09-13 DIAGNOSIS — Z89511 Acquired absence of right leg below knee: Secondary | ICD-10-CM | POA: Insufficient documentation

## 2020-09-13 DIAGNOSIS — Z79899 Other long term (current) drug therapy: Secondary | ICD-10-CM | POA: Diagnosis not present

## 2020-09-13 DIAGNOSIS — E1142 Type 2 diabetes mellitus with diabetic polyneuropathy: Secondary | ICD-10-CM | POA: Diagnosis not present

## 2020-09-13 DIAGNOSIS — E11621 Type 2 diabetes mellitus with foot ulcer: Secondary | ICD-10-CM | POA: Diagnosis not present

## 2020-09-13 DIAGNOSIS — N183 Chronic kidney disease, stage 3 unspecified: Secondary | ICD-10-CM | POA: Insufficient documentation

## 2020-09-13 DIAGNOSIS — R1114 Bilious vomiting: Secondary | ICD-10-CM | POA: Diagnosis not present

## 2020-09-13 DIAGNOSIS — L97509 Non-pressure chronic ulcer of other part of unspecified foot with unspecified severity: Secondary | ICD-10-CM | POA: Diagnosis not present

## 2020-09-13 LAB — CBC WITH DIFFERENTIAL/PLATELET
Abs Immature Granulocytes: 0.03 10*3/uL (ref 0.00–0.07)
Basophils Absolute: 0 10*3/uL (ref 0.0–0.1)
Basophils Relative: 0 %
Eosinophils Absolute: 0.1 10*3/uL (ref 0.0–0.5)
Eosinophils Relative: 1 %
HCT: 39.9 % (ref 39.0–52.0)
Hemoglobin: 13.3 g/dL (ref 13.0–17.0)
Immature Granulocytes: 0 %
Lymphocytes Relative: 15 %
Lymphs Abs: 1.5 10*3/uL (ref 0.7–4.0)
MCH: 30.9 pg (ref 26.0–34.0)
MCHC: 33.3 g/dL (ref 30.0–36.0)
MCV: 92.6 fL (ref 80.0–100.0)
Monocytes Absolute: 0.5 10*3/uL (ref 0.1–1.0)
Monocytes Relative: 5 %
Neutro Abs: 8 10*3/uL — ABNORMAL HIGH (ref 1.7–7.7)
Neutrophils Relative %: 79 %
Platelets: 296 10*3/uL (ref 150–400)
RBC: 4.31 MIL/uL (ref 4.22–5.81)
RDW: 13.2 % (ref 11.5–15.5)
WBC: 10.2 10*3/uL (ref 4.0–10.5)
nRBC: 0 % (ref 0.0–0.2)

## 2020-09-13 LAB — URINALYSIS, ROUTINE W REFLEX MICROSCOPIC
Bacteria, UA: NONE SEEN
Bilirubin Urine: NEGATIVE
Glucose, UA: 50 mg/dL — AB
Ketones, ur: NEGATIVE mg/dL
Leukocytes,Ua: NEGATIVE
Nitrite: NEGATIVE
Protein, ur: 30 mg/dL — AB
Specific Gravity, Urine: 1.01 (ref 1.005–1.030)
pH: 7 (ref 5.0–8.0)

## 2020-09-13 LAB — COMPREHENSIVE METABOLIC PANEL
ALT: 17 U/L (ref 0–44)
AST: 23 U/L (ref 15–41)
Albumin: 4 g/dL (ref 3.5–5.0)
Alkaline Phosphatase: 86 U/L (ref 38–126)
Anion gap: 10 (ref 5–15)
BUN: 20 mg/dL (ref 6–20)
CO2: 27 mmol/L (ref 22–32)
Calcium: 9.7 mg/dL (ref 8.9–10.3)
Chloride: 101 mmol/L (ref 98–111)
Creatinine, Ser: 1.8 mg/dL — ABNORMAL HIGH (ref 0.61–1.24)
GFR, Estimated: 47 mL/min — ABNORMAL LOW (ref >60)
Glucose, Bld: 192 mg/dL — ABNORMAL HIGH (ref 70–99)
Potassium: 3.5 mmol/L (ref 3.5–5.1)
Sodium: 138 mmol/L (ref 135–145)
Total Bilirubin: 0.4 mg/dL (ref 0.3–1.2)
Total Protein: 8.7 g/dL — ABNORMAL HIGH (ref 6.5–8.1)

## 2020-09-13 LAB — MAGNESIUM: Magnesium: 1.9 mg/dL (ref 1.7–2.4)

## 2020-09-13 LAB — LIPASE, BLOOD: Lipase: 35 U/L (ref 11–51)

## 2020-09-13 MED ORDER — SODIUM CHLORIDE 0.9 % IV BOLUS
1000.0000 mL | Freq: Once | INTRAVENOUS | Status: AC
  Administered 2020-09-13: 1000 mL via INTRAVENOUS

## 2020-09-13 MED ORDER — FAMOTIDINE IN NACL 20-0.9 MG/50ML-% IV SOLN
20.0000 mg | Freq: Once | INTRAVENOUS | Status: AC
  Administered 2020-09-13: 20 mg via INTRAVENOUS
  Filled 2020-09-13: qty 50

## 2020-09-13 MED ORDER — ONDANSETRON HCL 4 MG/2ML IJ SOLN
4.0000 mg | Freq: Once | INTRAMUSCULAR | Status: AC
  Administered 2020-09-13: 4 mg via INTRAVENOUS
  Filled 2020-09-13: qty 2

## 2020-09-13 MED ORDER — FENTANYL CITRATE (PF) 100 MCG/2ML IJ SOLN
50.0000 ug | Freq: Once | INTRAMUSCULAR | Status: AC
  Administered 2020-09-13: 50 ug via INTRAVENOUS
  Filled 2020-09-13: qty 2

## 2020-09-13 MED ORDER — SUCRALFATE 1 G PO TABS: 1.0000 g | ORAL_TABLET | Freq: Three times a day (TID) | ORAL | 0 refills | Status: DC

## 2020-09-13 MED ORDER — HYDROMORPHONE HCL 1 MG/ML IJ SOLN
1.0000 mg | Freq: Once | INTRAMUSCULAR | Status: AC
  Administered 2020-09-13: 1 mg via INTRAVENOUS
  Filled 2020-09-13: qty 1

## 2020-09-13 MED ORDER — LORAZEPAM 2 MG/ML IJ SOLN
1.0000 mg | Freq: Once | INTRAMUSCULAR | Status: AC
  Administered 2020-09-13: 1 mg via INTRAVENOUS
  Filled 2020-09-13: qty 1

## 2020-09-13 MED ORDER — SODIUM CHLORIDE 0.9 % IV SOLN: INTRAVENOUS | Status: DC

## 2020-09-13 MED ORDER — SUCRALFATE 1 GM/10ML PO SUSP
1.0000 g | Freq: Once | ORAL | Status: AC
  Administered 2020-09-13: 1 g via ORAL
  Filled 2020-09-13: qty 10
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
## 2020-09-13 NOTE — ED Triage Notes (Signed)
Pt reports four days of generalized abdominal pain with n/v. Seen for same here two days ago and states no improvement since then, even with medications.

## 2020-09-13 NOTE — ED Notes (Signed)
Patient transported to Ultrasound 

## 2020-09-13 NOTE — ED Notes (Signed)
Pt continues to endorse 10/10 pain scale to abdomen and also continues to endorse nausea. Will notify MD.

## 2020-09-13 NOTE — Discharge Instructions (Signed)
As discussed, today's evaluation has been generally reassuring, similar to that of a few days ago.  Given her history of recurrent abdominal pain it is important that he schedule follow-up with our gastroenterology colleagues.  Please take all medication as prescribed, and do not hesitate to return here for any additional changes.

## 2020-09-14 LAB — AEROBIC CULTURE W GRAM STAIN (SUPERFICIAL SPECIMEN): Culture: NORMAL

## 2020-09-15 ENCOUNTER — Emergency Department (HOSPITAL_COMMUNITY)
Admission: EM | Admit: 2020-09-15 | Discharge: 2020-09-15 | Disposition: A | Discharge status: Home / Self Care | Payer: Medicare HMO | Attending: Emergency Medicine | Admitting: Emergency Medicine

## 2020-09-15 DIAGNOSIS — K219 Gastro-esophageal reflux disease without esophagitis: Secondary | ICD-10-CM | POA: Insufficient documentation

## 2020-09-15 DIAGNOSIS — I129 Hypertensive chronic kidney disease with stage 1 through stage 4 chronic kidney disease, or unspecified chronic kidney disease: Secondary | ICD-10-CM | POA: Insufficient documentation

## 2020-09-15 DIAGNOSIS — Z794 Long term (current) use of insulin: Secondary | ICD-10-CM | POA: Insufficient documentation

## 2020-09-15 DIAGNOSIS — Z20822 Contact with and (suspected) exposure to covid-19: Secondary | ICD-10-CM | POA: Insufficient documentation

## 2020-09-15 DIAGNOSIS — E1140 Type 2 diabetes mellitus with diabetic neuropathy, unspecified: Secondary | ICD-10-CM | POA: Insufficient documentation

## 2020-09-15 DIAGNOSIS — R1115 Cyclical vomiting syndrome unrelated to migraine: Secondary | ICD-10-CM

## 2020-09-15 DIAGNOSIS — G43A1 Cyclical vomiting, intractable: Secondary | ICD-10-CM | POA: Insufficient documentation

## 2020-09-15 DIAGNOSIS — R112 Nausea with vomiting, unspecified: Secondary | ICD-10-CM | POA: Diagnosis not present

## 2020-09-15 DIAGNOSIS — R109 Unspecified abdominal pain: Secondary | ICD-10-CM | POA: Diagnosis not present

## 2020-09-15 DIAGNOSIS — F17210 Nicotine dependence, cigarettes, uncomplicated: Secondary | ICD-10-CM | POA: Diagnosis not present

## 2020-09-15 DIAGNOSIS — N183 Chronic kidney disease, stage 3 unspecified: Secondary | ICD-10-CM | POA: Diagnosis not present

## 2020-09-15 DIAGNOSIS — Z79899 Other long term (current) drug therapy: Secondary | ICD-10-CM | POA: Insufficient documentation

## 2020-09-15 DIAGNOSIS — E1122 Type 2 diabetes mellitus with diabetic chronic kidney disease: Secondary | ICD-10-CM | POA: Diagnosis not present

## 2020-09-15 DIAGNOSIS — R69 Illness, unspecified: Secondary | ICD-10-CM | POA: Diagnosis not present

## 2020-09-15 LAB — CBC WITH DIFFERENTIAL/PLATELET
Abs Immature Granulocytes: 0.03 10*3/uL (ref 0.00–0.07)
Basophils Absolute: 0 10*3/uL (ref 0.0–0.1)
Basophils Relative: 0 %
Eosinophils Absolute: 0.1 10*3/uL (ref 0.0–0.5)
Eosinophils Relative: 1 %
HCT: 39.6 % (ref 39.0–52.0)
Hemoglobin: 13.3 g/dL (ref 13.0–17.0)
Immature Granulocytes: 0 %
Lymphocytes Relative: 12 %
Lymphs Abs: 1.1 10*3/uL (ref 0.7–4.0)
MCH: 30.7 pg (ref 26.0–34.0)
MCHC: 33.6 g/dL (ref 30.0–36.0)
MCV: 91.5 fL (ref 80.0–100.0)
Monocytes Absolute: 0.4 10*3/uL (ref 0.1–1.0)
Monocytes Relative: 4 %
Neutro Abs: 7.1 10*3/uL (ref 1.7–7.7)
Neutrophils Relative %: 83 %
Platelets: 298 10*3/uL (ref 150–400)
RBC: 4.33 MIL/uL (ref 4.22–5.81)
RDW: 12.8 % (ref 11.5–15.5)
WBC: 8.6 10*3/uL (ref 4.0–10.5)
nRBC: 0 % (ref 0.0–0.2)

## 2020-09-15 LAB — COMPREHENSIVE METABOLIC PANEL
ALT: 15 U/L (ref 0–44)
AST: 40 U/L (ref 15–41)
Albumin: 3.9 g/dL (ref 3.5–5.0)
Alkaline Phosphatase: 85 U/L (ref 38–126)
Anion gap: 7 (ref 5–15)
BUN: 17 mg/dL (ref 6–20)
CO2: 29 mmol/L (ref 22–32)
Calcium: 9.4 mg/dL (ref 8.9–10.3)
Chloride: 102 mmol/L (ref 98–111)
Creatinine, Ser: 1.64 mg/dL — ABNORMAL HIGH (ref 0.61–1.24)
GFR, Estimated: 53 mL/min — ABNORMAL LOW (ref >60)
Glucose, Bld: 200 mg/dL — ABNORMAL HIGH (ref 70–99)
Potassium: 4.6 mmol/L (ref 3.5–5.1)
Sodium: 138 mmol/L (ref 135–145)
Total Bilirubin: 1.4 mg/dL — ABNORMAL HIGH (ref 0.3–1.2)
Total Protein: 7.9 g/dL (ref 6.5–8.1)

## 2020-09-15 LAB — URINALYSIS, ROUTINE W REFLEX MICROSCOPIC
Bacteria, UA: NONE SEEN
Bilirubin Urine: NEGATIVE
Glucose, UA: 50 mg/dL — AB
Hgb urine dipstick: NEGATIVE
Ketones, ur: NEGATIVE mg/dL
Leukocytes,Ua: NEGATIVE
Nitrite: NEGATIVE
Protein, ur: 30 mg/dL — AB
Specific Gravity, Urine: 1.011 (ref 1.005–1.030)
pH: 7 (ref 5.0–8.0)

## 2020-09-15 LAB — MAGNESIUM: Magnesium: 1.9 mg/dL (ref 1.7–2.4)

## 2020-09-15 LAB — RESP PANEL BY RT-PCR (FLU A&B, COVID) ARPGX2
Influenza A by PCR: NEGATIVE
Influenza B by PCR: NEGATIVE
SARS Coronavirus 2 by RT PCR: NEGATIVE

## 2020-09-15 LAB — LIPASE, BLOOD: Lipase: 42 U/L (ref 11–51)

## 2020-09-15 LAB — RAPID URINE DRUG SCREEN, HOSP PERFORMED
Amphetamines: NOT DETECTED
Barbiturates: NOT DETECTED
Benzodiazepines: NOT DETECTED
Cocaine: NOT DETECTED
Opiates: NOT DETECTED
Tetrahydrocannabinol: POSITIVE — AB

## 2020-09-15 LAB — CBG MONITORING, ED: Glucose-Capillary: 210 mg/dL — ABNORMAL HIGH (ref 70–99)

## 2020-09-15 MED ORDER — SODIUM CHLORIDE 0.9 % IV SOLN: INTRAVENOUS | Status: DC | PRN

## 2020-09-15 MED ORDER — DICYCLOMINE HCL 20 MG PO TABS: 20.0000 mg | ORAL_TABLET | Freq: Two times a day (BID) | ORAL | 0 refills | Status: AC

## 2020-09-15 MED ORDER — SODIUM CHLORIDE 0.9 % IV SOLN
25.0000 mg | Freq: Four times a day (QID) | INTRAVENOUS | Status: DC | PRN
  Administered 2020-09-15: 25 mg via INTRAVENOUS
  Filled 2020-09-15: qty 1

## 2020-09-15 MED ORDER — SODIUM CHLORIDE 0.9 % IV BOLUS
1000.0000 mL | Freq: Once | INTRAVENOUS | Status: AC
  Administered 2020-09-15: 1000 mL via INTRAVENOUS

## 2020-09-15 MED ORDER — ONDANSETRON HCL 4 MG/2ML IJ SOLN
4.0000 mg | Freq: Once | INTRAMUSCULAR | Status: AC
  Administered 2020-09-15: 4 mg via INTRAVENOUS
  Filled 2020-09-15: qty 2

## 2020-09-15 MED ORDER — HYDROMORPHONE HCL 1 MG/ML IJ SOLN
1.0000 mg | Freq: Once | INTRAMUSCULAR | Status: AC
  Administered 2020-09-15: 1 mg via INTRAVENOUS
  Filled 2020-09-15: qty 1

## 2020-09-15 MED ORDER — PANTOPRAZOLE SODIUM 40 MG IV SOLR
40.0000 mg | Freq: Once | INTRAVENOUS | Status: AC
  Administered 2020-09-15: 40 mg via INTRAVENOUS
  Filled 2020-09-15: qty 40
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
## 2020-09-15 NOTE — ED Notes (Signed)
Waiting on Pharmacy for Phenergan 25 mg. IVPB

## 2020-09-15 NOTE — ED Triage Notes (Signed)
Pt presents to the ED with nausea and vomiting with abdominal pain. Pt vomiting in triage. 10/10 generalized abdominal pain. Hx diabetes.

## 2020-09-15 NOTE — Discharge Instructions (Addendum)
It was recommended to you to be admitted for your intractable nausea and vomiting since this is your third visit to the ED for the past 4 days, however you wanted to go home, feel free to come back to the emergency department if you have any new worsening concerning symptoms or if you start to have continuous vomiting again.  I want you to follow-up with the GI doctor, if you do not have one you can go to the [facility], the information is above.  I think it would be imperative for you to get endoscopy as we discussed.  I want you to continue taking your PPIs, Carafate and I also added Bentyl for your abdominal spasms.  You can take this as directed.  Please use the attached instructions.  Stay away from marijuana.  Follow-up with your primary care in the next couple of days.  Get help right away if: You have blood in your vomit. Your vomit looks like coffee grounds. You have stools that are bloody or black, or stools that look like tar. You have signs of dehydration, such as: Sunken eyes. Not making tears while crying. Very dry mouth. Cracked lips. Decreased urine production. Dark urine. Urine may be the color of tea. Weakness. Sleepiness.

## 2020-09-16 LAB — CULTURE, BLOOD (ROUTINE X 2)
Culture: NO GROWTH
Culture: NO GROWTH
Special Requests: ADEQUATE
Special Requests: ADEQUATE

## 2020-09-17 ENCOUNTER — Other Ambulatory Visit: Payer: Self-pay | Admitting: Physician Assistant

## 2020-09-17 ENCOUNTER — Telehealth: Payer: Self-pay

## 2020-09-17 MED ORDER — OXYCODONE-ACETAMINOPHEN 5-325 MG PO TABS: 1.0000 | ORAL_TABLET | Freq: Three times a day (TID) | ORAL | 0 refills | Status: AC | PRN

## 2020-09-17 NOTE — Telephone Encounter (Signed)
Changed to 1 every 8 hours

## 2020-09-17 NOTE — Telephone Encounter (Signed)
I called pt and advised of interval change. Will call with any questions.

## 2020-09-17 NOTE — Telephone Encounter (Signed)
patient called he is requesting rx refill for percocet call back:[phone]

## 2020-09-17 NOTE — Telephone Encounter (Signed)
Please see below and advise.

## 2020-09-18 ENCOUNTER — Ambulatory Visit (INDEPENDENT_AMBULATORY_CARE_PROVIDER_SITE_OTHER): Payer: Medicare HMO | Admitting: Orthopedic Surgery

## 2020-09-18 DIAGNOSIS — Z89511 Acquired absence of right leg below knee: Secondary | ICD-10-CM

## 2020-09-27 ENCOUNTER — Encounter: Payer: Self-pay | Admitting: Orthopedic Surgery
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
## 2020-10-02 ENCOUNTER — Ambulatory Visit: Payer: Medicare HMO | Admitting: Orthopedic Surgery

## 2020-10-08 ENCOUNTER — Encounter: Payer: Self-pay | Admitting: Orthopedic Surgery

## 2020-10-08 ENCOUNTER — Ambulatory Visit (INDEPENDENT_AMBULATORY_CARE_PROVIDER_SITE_OTHER): Payer: Medicare HMO | Admitting: Orthopedic Surgery

## 2020-10-08 DIAGNOSIS — Z89511 Acquired absence of right leg below knee: Secondary | ICD-10-CM
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
## 2020-10-28 ENCOUNTER — Emergency Department (HOSPITAL_COMMUNITY)
Admission: EM | Admit: 2020-10-28 | Discharge: 2020-10-28 | Disposition: A | Discharge status: Home / Self Care | Payer: Medicare HMO | Attending: Student | Admitting: Student

## 2020-10-28 ENCOUNTER — Other Ambulatory Visit: Payer: Self-pay

## 2020-10-28 ENCOUNTER — Encounter (HOSPITAL_COMMUNITY): Payer: Self-pay | Admitting: Emergency Medicine

## 2020-10-28 DIAGNOSIS — U071 COVID-19: Secondary | ICD-10-CM | POA: Diagnosis not present

## 2020-10-28 DIAGNOSIS — Z5321 Procedure and treatment not carried out due to patient leaving prior to being seen by health care provider: Secondary | ICD-10-CM | POA: Insufficient documentation

## 2020-10-28 DIAGNOSIS — R109 Unspecified abdominal pain: Secondary | ICD-10-CM | POA: Insufficient documentation

## 2020-10-28 DIAGNOSIS — R112 Nausea with vomiting, unspecified: Secondary | ICD-10-CM | POA: Insufficient documentation

## 2020-10-28 DIAGNOSIS — R509 Fever, unspecified: Secondary | ICD-10-CM | POA: Insufficient documentation

## 2020-10-28 LAB — COMPREHENSIVE METABOLIC PANEL
ALT: 29 U/L (ref 0–44)
AST: 28 U/L (ref 15–41)
Albumin: 3.7 g/dL (ref 3.5–5.0)
Alkaline Phosphatase: 83 U/L (ref 38–126)
Anion gap: 8 (ref 5–15)
BUN: 16 mg/dL (ref 6–20)
CO2: 26 mmol/L (ref 22–32)
Calcium: 9.2 mg/dL (ref 8.9–10.3)
Chloride: 104 mmol/L (ref 98–111)
Creatinine, Ser: 1.63 mg/dL — ABNORMAL HIGH (ref 0.61–1.24)
GFR, Estimated: 53 mL/min — ABNORMAL LOW (ref >60)
Glucose, Bld: 164 mg/dL — ABNORMAL HIGH (ref 70–99)
Potassium: 3.7 mmol/L (ref 3.5–5.1)
Sodium: 138 mmol/L (ref 135–145)
Total Bilirubin: 0.9 mg/dL (ref 0.3–1.2)
Total Protein: 8.2 g/dL — ABNORMAL HIGH (ref 6.5–8.1)

## 2020-10-28 LAB — CBC WITH DIFFERENTIAL/PLATELET
Abs Immature Granulocytes: 0 10*3/uL (ref 0.00–0.07)
Basophils Absolute: 0 10*3/uL (ref 0.0–0.1)
Basophils Relative: 0 %
Eosinophils Absolute: 0 10*3/uL (ref 0.0–0.5)
Eosinophils Relative: 0 %
HCT: 41.4 % (ref 39.0–52.0)
Hemoglobin: 13.9 g/dL (ref 13.0–17.0)
Immature Granulocytes: 0 %
Lymphocytes Relative: 16 %
Lymphs Abs: 0.6 10*3/uL — ABNORMAL LOW (ref 0.7–4.0)
MCH: 29.3 pg (ref 26.0–34.0)
MCHC: 33.6 g/dL (ref 30.0–36.0)
MCV: 87.2 fL (ref 80.0–100.0)
Monocytes Absolute: 0.2 10*3/uL (ref 0.1–1.0)
Monocytes Relative: 5 %
Neutro Abs: 2.9 10*3/uL (ref 1.7–7.7)
Neutrophils Relative %: 79 %
Platelets: 182 10*3/uL (ref 150–400)
RBC: 4.75 MIL/uL (ref 4.22–5.81)
RDW: 13.5 % (ref 11.5–15.5)
WBC: 3.6 10*3/uL — ABNORMAL LOW (ref 4.0–10.5)
nRBC: 0 % (ref 0.0–0.2)

## 2020-10-28 LAB — LIPASE, BLOOD: Lipase: 28 U/L (ref 11–51)

## 2020-10-28 LAB — RESP PANEL BY RT-PCR (FLU A&B, COVID) ARPGX2
Influenza A by PCR: NEGATIVE
Influenza B by PCR: NEGATIVE
SARS Coronavirus 2 by RT PCR: POSITIVE — AB

## 2020-10-28 MED ORDER — ONDANSETRON 4 MG PO TBDP: 4.0000 mg | ORAL_TABLET | Freq: Once | ORAL | Status: DC

## 2020-10-28 NOTE — ED Notes (Signed)
Called for repeat VS x3, no response. 

## 2020-10-28 NOTE — ED Provider Notes (Signed)
Emergency Medicine Provider Triage Evaluation Note  [name] , a 45 y.o. male  was evaluated in triage.  Pt complains of generalized abdominal pain associated with nausea, vomiting, and fever x4 days. No sick contacts or known COVID exposures. He is unvaccinated against COVID-19. Admits to roughly 20 episodes of NBNB emesis daily. No diarrhea. Admits to marijuana use.  Review of Systems  Positive: Abdominal pain, nausea, vomiting Negative: Chest pain  Physical Exam  BP (!) 151/94 (BP Location: Left Arm)   Pulse 80   Temp 98.4 F (36.9 C) (Oral)   Resp 20   SpO2 98%  Gen:   Awake, no distress   Resp:  Normal effort  MSK:   Moves extremities without difficulty Other:  Diffuse abdominal pain. No rebound or guarding  Medical Decision Making  Medically screening exam initiated at 5:14 PM.  Appropriate orders placed.  [name] was informed that the remainder of the evaluation will be completed by another provider, this initial triage assessment does not replace that evaluation, and the importance of remaining in the ED until their evaluation is complete.  Abdominal pain with nausea, vomiting, and fever. COVID test to rule out infection. Abdominal labs ordered.    [name] 10/28/20 1716    Jacalyn Lefevre, MD 10/28/20 1721

## 2020-10-28 NOTE — ED Triage Notes (Signed)
Patient coming from home, complaint of abd pain, n/v, fever x4days. VSS. NAD.

## 2020-10-29 ENCOUNTER — Ambulatory Visit: Payer: Medicare HMO | Admitting: Physician Assistant

## 2020-10-29 ENCOUNTER — Telehealth: Payer: Self-pay

## 2020-10-29 NOTE — Telephone Encounter (Signed)
Called to discuss with patient about COVID-19 symptoms and the use of one of the available treatments for those with mild to moderate Covid symptoms and at a high risk of hospitalization.  Pt appears to qualify for outpatient treatment due to co-morbid conditions and/or a member of an at-risk group in accordance with the FDA Emergency Use Authorization.    Symptom onset: 10/26/20 Nausea,vomiting,fever Vaccinated: No Booster? No Immunocompromised? No Qualifiers: HTN,DM,Obesity NIH Criteria: Tier 1  Unable to reach pt - Left message and call back number [phone].   [name]

## 2020-10-30 ENCOUNTER — Other Ambulatory Visit: Payer: Self-pay

## 2020-10-30 ENCOUNTER — Emergency Department (HOSPITAL_COMMUNITY)
Admission: EM | Admit: 2020-10-30 | Discharge: 2020-10-30 | Disposition: A | Discharge status: Home / Self Care | Payer: Medicare HMO | Attending: Emergency Medicine | Admitting: Emergency Medicine

## 2020-10-30 DIAGNOSIS — R1013 Epigastric pain: Secondary | ICD-10-CM | POA: Diagnosis not present

## 2020-10-30 DIAGNOSIS — E1140 Type 2 diabetes mellitus with diabetic neuropathy, unspecified: Secondary | ICD-10-CM | POA: Insufficient documentation

## 2020-10-30 DIAGNOSIS — Z8719 Personal history of other diseases of the digestive system: Secondary | ICD-10-CM | POA: Insufficient documentation

## 2020-10-30 DIAGNOSIS — N183 Chronic kidney disease, stage 3 unspecified: Secondary | ICD-10-CM | POA: Diagnosis not present

## 2020-10-30 DIAGNOSIS — R112 Nausea with vomiting, unspecified: Secondary | ICD-10-CM | POA: Insufficient documentation

## 2020-10-30 DIAGNOSIS — K219 Gastro-esophageal reflux disease without esophagitis: Secondary | ICD-10-CM | POA: Insufficient documentation

## 2020-10-30 DIAGNOSIS — I129 Hypertensive chronic kidney disease with stage 1 through stage 4 chronic kidney disease, or unspecified chronic kidney disease: Secondary | ICD-10-CM | POA: Diagnosis not present

## 2020-10-30 DIAGNOSIS — Z794 Long term (current) use of insulin: Secondary | ICD-10-CM | POA: Insufficient documentation

## 2020-10-30 DIAGNOSIS — E1122 Type 2 diabetes mellitus with diabetic chronic kidney disease: Secondary | ICD-10-CM | POA: Diagnosis not present

## 2020-10-30 DIAGNOSIS — Z79899 Other long term (current) drug therapy: Secondary | ICD-10-CM | POA: Insufficient documentation

## 2020-10-30 DIAGNOSIS — F17210 Nicotine dependence, cigarettes, uncomplicated: Secondary | ICD-10-CM | POA: Insufficient documentation

## 2020-10-30 DIAGNOSIS — R69 Illness, unspecified: Secondary | ICD-10-CM | POA: Diagnosis not present

## 2020-10-30 DIAGNOSIS — R1110 Vomiting, unspecified: Secondary | ICD-10-CM

## 2020-10-30 LAB — CBC
HCT: 42.2 % (ref 39.0–52.0)
Hemoglobin: 13.9 g/dL (ref 13.0–17.0)
MCH: 29.4 pg (ref 26.0–34.0)
MCHC: 32.9 g/dL (ref 30.0–36.0)
MCV: 89.2 fL (ref 80.0–100.0)
Platelets: 167 10*3/uL (ref 150–400)
RBC: 4.73 MIL/uL (ref 4.22–5.81)
RDW: 13.7 % (ref 11.5–15.5)
WBC: 6.9 10*3/uL (ref 4.0–10.5)
nRBC: 0 % (ref 0.0–0.2)

## 2020-10-30 LAB — URINALYSIS, ROUTINE W REFLEX MICROSCOPIC
Bacteria, UA: NONE SEEN
Bilirubin Urine: NEGATIVE
Glucose, UA: >=500 mg/dL — AB
Hgb urine dipstick: NEGATIVE
Ketones, ur: NEGATIVE mg/dL
Leukocytes,Ua: NEGATIVE
Nitrite: NEGATIVE
Protein, ur: 100 mg/dL — AB
Specific Gravity, Urine: 1.009 (ref 1.005–1.030)
pH: 7 (ref 5.0–8.0)

## 2020-10-30 LAB — COMPREHENSIVE METABOLIC PANEL
ALT: 25 U/L (ref 0–44)
AST: 25 U/L (ref 15–41)
Albumin: 3.8 g/dL (ref 3.5–5.0)
Alkaline Phosphatase: 78 U/L (ref 38–126)
Anion gap: 11 (ref 5–15)
BUN: 18 mg/dL (ref 6–20)
CO2: 23 mmol/L (ref 22–32)
Calcium: 9 mg/dL (ref 8.9–10.3)
Chloride: 103 mmol/L (ref 98–111)
Creatinine, Ser: 1.59 mg/dL — ABNORMAL HIGH (ref 0.61–1.24)
GFR, Estimated: 55 mL/min — ABNORMAL LOW (ref >60)
Glucose, Bld: 224 mg/dL — ABNORMAL HIGH (ref 70–99)
Potassium: 3.8 mmol/L (ref 3.5–5.1)
Sodium: 137 mmol/L (ref 135–145)
Total Bilirubin: 0.5 mg/dL (ref 0.3–1.2)
Total Protein: 7.7 g/dL (ref 6.5–8.1)

## 2020-10-30 LAB — LIPASE, BLOOD: Lipase: 61 U/L — ABNORMAL HIGH (ref 11–51)

## 2020-10-30 MED ORDER — PANTOPRAZOLE SODIUM 20 MG PO TBEC: 20.0000 mg | DELAYED_RELEASE_TABLET | Freq: Every day | ORAL | 0 refills | Status: DC

## 2020-10-30 MED ORDER — ONDANSETRON HCL 4 MG/2ML IJ SOLN
4.0000 mg | Freq: Once | INTRAMUSCULAR | Status: AC
  Administered 2020-10-30: 4 mg via INTRAVENOUS
  Filled 2020-10-30: qty 2

## 2020-10-30 MED ORDER — ALUM & MAG HYDROXIDE-SIMETH 200-200-20 MG/5ML PO SUSP
30.0000 mL | Freq: Once | ORAL | Status: AC
  Administered 2020-10-30: 30 mL via ORAL
  Filled 2020-10-30: qty 30

## 2020-10-30 MED ORDER — MORPHINE SULFATE (PF) 4 MG/ML IV SOLN
4.0000 mg | Freq: Once | INTRAVENOUS | Status: AC
  Administered 2020-10-30: 4 mg via INTRAVENOUS
  Filled 2020-10-30: qty 1

## 2020-10-30 MED ORDER — ONDANSETRON 4 MG PO TBDP: 4.0000 mg | ORAL_TABLET | Freq: Three times a day (TID) | ORAL | 0 refills | Status: DC | PRN

## 2020-10-30 MED ORDER — LIDOCAINE VISCOUS HCL 2 % MT SOLN
15.0000 mL | Freq: Once | OROMUCOSAL | Status: AC
  Administered 2020-10-30: 15 mL via ORAL
  Filled 2020-10-30: qty 15

## 2020-10-30 MED ORDER — LACTATED RINGERS IV BOLUS
1000.0000 mL | Freq: Once | INTRAVENOUS | Status: AC
  Administered 2020-10-30: 1000 mL via INTRAVENOUS
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
## 2020-10-30 NOTE — Discharge Instructions (Addendum)
Use the prescribed Zofran for nausea vomiting.  Buy over-the-counter Maalox, and use it as prescribed on the bottle for symptom control of your likely gastritis.  You also need to start taking a proton pump inhibitor (acid blocking medicine).  This medicine needs to be taken at least once a day for 2 weeks.  This will help decrease the acid production in your gut and allow your stomach to heal.  Follow-up with your primary care provider for possible need for evaluation by gastroenterology for bad gastritis or reflux.

## 2020-10-30 NOTE — ED Triage Notes (Signed)
Pt reports abdominal pain, nausea, vomting for the last 4-5days. States subjective fever at home. VSS.

## 2020-11-03 DIAGNOSIS — Z20822 Contact with and (suspected) exposure to covid-19: Secondary | ICD-10-CM | POA: Diagnosis not present

## 2020-11-09 ENCOUNTER — Encounter: Payer: Self-pay | Admitting: Physician Assistant

## 2020-11-09 ENCOUNTER — Ambulatory Visit (INDEPENDENT_AMBULATORY_CARE_PROVIDER_SITE_OTHER): Payer: Medicare HMO | Admitting: Physician Assistant

## 2020-11-09 DIAGNOSIS — Z89511 Acquired absence of right leg below knee: Secondary | ICD-10-CM
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
## 2020-12-25 ENCOUNTER — Telehealth: Payer: Self-pay

## 2020-12-25 ENCOUNTER — Ambulatory Visit: Payer: Medicare HMO | Admitting: Family Medicine

## 2020-12-25 NOTE — Telephone Encounter (Signed)
One touch ultra STRIPES

## 2020-12-26 ENCOUNTER — Encounter: Payer: Self-pay | Admitting: Nurse Practitioner

## 2020-12-26 ENCOUNTER — Other Ambulatory Visit: Payer: Self-pay

## 2020-12-26 ENCOUNTER — Ambulatory Visit (INDEPENDENT_AMBULATORY_CARE_PROVIDER_SITE_OTHER): Payer: Medicare HMO | Admitting: Nurse Practitioner

## 2020-12-26 VITALS — BP 127/87 | HR 75 | Temp 97.5°F | Ht 73.0 in | Wt 224.1 lb

## 2020-12-26 DIAGNOSIS — E1165 Type 2 diabetes mellitus with hyperglycemia: Secondary | ICD-10-CM

## 2020-12-26 DIAGNOSIS — E118 Type 2 diabetes mellitus with unspecified complications: Secondary | ICD-10-CM | POA: Diagnosis not present

## 2020-12-26 DIAGNOSIS — IMO0002 Reserved for concepts with insufficient information to code with codable children: Secondary | ICD-10-CM

## 2020-12-26 DIAGNOSIS — E1151 Type 2 diabetes mellitus with diabetic peripheral angiopathy without gangrene: Secondary | ICD-10-CM | POA: Diagnosis not present

## 2020-12-26 DIAGNOSIS — E119 Type 2 diabetes mellitus without complications: Secondary | ICD-10-CM | POA: Diagnosis not present

## 2020-12-26 DIAGNOSIS — N1831 Chronic kidney disease, stage 3a: Secondary | ICD-10-CM

## 2020-12-26 DIAGNOSIS — I10 Essential (primary) hypertension: Secondary | ICD-10-CM | POA: Diagnosis not present

## 2020-12-26 DIAGNOSIS — E785 Hyperlipidemia, unspecified: Secondary | ICD-10-CM | POA: Diagnosis not present

## 2020-12-26 DIAGNOSIS — R600 Localized edema: Secondary | ICD-10-CM | POA: Diagnosis not present

## 2020-12-26 DIAGNOSIS — Z1159 Encounter for screening for other viral diseases: Secondary | ICD-10-CM | POA: Diagnosis not present

## 2020-12-26 LAB — POCT URINALYSIS DIPSTICK
Bilirubin, UA: NEGATIVE
Blood, UA: NEGATIVE
Glucose, UA: NEGATIVE
Leukocytes, UA: NEGATIVE
Nitrite, UA: NEGATIVE
Protein, UA: POSITIVE — AB
Spec Grav, UA: >=1.03 — AB (ref 1.010–1.025)
Urobilinogen, UA: 0.2 E.U./dL
pH, UA: 5.5 (ref 5.0–8.0)

## 2020-12-26 LAB — POCT GLYCOSYLATED HEMOGLOBIN (HGB A1C)
HbA1c POC (<> result, manual entry): 6.3 % (ref 4.0–5.6)
HbA1c, POC (controlled diabetic range): 6.3 % (ref 0.0–7.0)
HbA1c, POC (prediabetic range): 6.3 % (ref 5.7–6.4)
Hemoglobin A1C: 6.3 % — AB (ref 4.0–5.6)

## 2020-12-26 MED ORDER — AMLODIPINE BESYLATE 10 MG PO TABS: 10.0000 mg | ORAL_TABLET | Freq: Every day | ORAL | 3 refills | Status: DC

## 2020-12-26 MED ORDER — NOVOLOG FLEXPEN 100 UNIT/ML ~~LOC~~ SOPN: 8.0000 [IU] | PEN_INJECTOR | Freq: Three times a day (TID) | SUBCUTANEOUS | 3 refills | Status: DC

## 2020-12-26 MED ORDER — ONETOUCH ULTRA VI STRP: ORAL_STRIP | 12 refills | Status: AC

## 2020-12-26 MED ORDER — INSULIN GLARGINE 100 UNIT/ML SOLOSTAR PEN
20.0000 [IU] | PEN_INJECTOR | Freq: Every day | SUBCUTANEOUS | 3 refills | Status: DC
Start: 2020-12-26 — End: 2021-01-03

## 2020-12-26 MED ORDER — ONETOUCH ULTRA VI STRP: ORAL_STRIP | 12 refills | Status: DC

## 2020-12-26 MED ORDER — GLUCOSE BLOOD VI STRP: ORAL_STRIP | 12 refills | Status: DC

## 2020-12-26 MED ORDER — ATORVASTATIN CALCIUM 10 MG PO TABS: 10.0000 mg | ORAL_TABLET | Freq: Every day | ORAL | 3 refills | Status: DC
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
## 2020-12-26 NOTE — Patient Instructions (Signed)
Chronic Kidney Disease, Adult Chronic kidney disease is when lasting damage happens to the kidneys slowly over a long time. The kidneys help to: Make pee (urine). Make hormones. Keep the right amount of fluids and chemicals in the body. Most often, this disease does not go away. You must take steps to help keep the kidney damage from getting worse. If steps are not taken, the kidneys might stop working forever. What are the causes? Diabetes. High blood pressure. Diseases that affect the heart and blood vessels. Other kidney diseases. Diseases of the body's disease-fighting system. A problem with the flow of pee. Infections of the organs that make pee, store it, and take it out of the body. Swelling or irritation of your blood vessels. What increases the risk? Getting older. Having someone in your family who has kidney disease or kidney failure. Having a disease caused by genes. Taking medicines often that harm the kidneys. Being near or having contact with harmful substances. Being very overweight. Using tobacco now or in the past. What are the signs or symptoms? Feeling very tired. Having a swollen face, legs, ankles, or feet. Feeling like you may vomit or vomiting. Not feeling hungry. Being confused or not able to focus. Twitches and cramps in the leg muscles or other muscles. Dry, itchy skin. A taste of metal in your mouth. Making less pee, or making more pee. Shortness of breath. Trouble sleeping. You may also become anemic or get weak bones. Anemic means there is not enough red blood cells or hemoglobin in your blood. You may get symptoms slowly. You may not notice them until the kidney damage gets very bad. How is this treated? Often, there is no cure for this disease. Treatment can help with symptoms and help keep the disease from getting worse. You may need to: Avoid alcohol. Avoid foods that are high in salt, potassium, phosphorous, and protein. Take medicines for  symptoms and to help control other conditions. Have dialysis. This treatment gets harmful waste out of your body. Treat other problems that cause your kidney disease or make it worse. Follow these instructions at home: Medicines Take over-the-counter and prescription medicines only as told by your doctor. Do not take any new medicines, vitamins, or supplements unless your doctor says it is okay. Lifestyle  Do not smoke or use any products that contain nicotine or tobacco. If you need help quitting, ask your doctor. If you drink alcohol: Limit how much you use to: 0-1 drink a day for women who are not pregnant. 0-2 drinks a day for men. Know how much alcohol is in your drink. In the [place] one drink equals one 12 oz bottle of beer (355 mL), one 5 oz glass of wine (148 mL), or one 1 oz glass of hard liquor (44 mL). Stay at a healthy weight. If you need help losing weight, ask your doctor. General instructions  Follow instructions from your doctor about what you cannot eat or drink. Track your blood pressure at home. Tell your doctor about any changes. If you have diabetes, track your blood sugar. Exercise at least 30 minutes a day, 5 days a week. Keep your shots (vaccinations) up to date. Keep all follow-up visits. Where to find more information American Association of Kidney Patients: www.aakp.org National Kidney Foundation: www.kidney.org American Kidney Fund: www.akfinc.org Life Options: www.lifeoptions.org Kidney School: www.kidneyschool.org Contact a doctor if: Your symptoms get worse. You get new symptoms. Get help right away if: You get symptoms of end-stage kidney disease. These   include: Headaches. Losing feeling in your hands or feet. Easy bruising. Having hiccups often. Chest pain. Shortness of breath. Lack of menstrual periods, in women. You have a fever. You make less pee than normal. You have pain or you bleed when you pee or poop. These symptoms may be an  emergency. Get help right away. Call your local emergency services (911 in the [place] Do not wait to see if the symptoms will go away. Do not drive yourself to the hospital. Summary Chronic kidney disease is when lasting damage happens to the kidneys slowly over a long time. Causes of this disease include diabetes and high blood pressure. Often, there is no cure for this disease. Treatment can help symptoms and help keep the disease from getting worse. Treatment may involve lifestyle changes, medicines, and dialysis. This information is not intended to replace advice given to you by your health care provider. Make sure you discuss any questions you have with your health care provider. Document Revised: 08/31/2019 Document Reviewed: 08/31/2019 Elsevier Patient Education  2022 Elsevier Inc.   Diabetes Mellitus and Nutrition, Adult When you have diabetes, or diabetes mellitus, it is very important to have healthy eating habits because your blood sugar (glucose) levels are greatly affected by what you eat and drink. Eating healthy foods in the right amounts, at about the same times every day, can help you: Control your blood glucose. Lower your risk of heart disease. Improve your blood pressure. Reach or maintain a healthy weight. What can affect my meal plan? Every person with diabetes is different, and each person has different needs for a meal plan. Your health care provider may recommend that you work with a dietitian to make a meal plan that is best for you. Your meal plan may vary depending on factors such as: The calories you need. The medicines you take. Your weight. Your blood glucose, blood pressure, and cholesterol levels. Your activity level. Other health conditions you have, such as heart or kidney disease. How do carbohydrates affect me? Carbohydrates, also called carbs, affect your blood glucose level more than any other type of food. Eating carbs naturally raises the amount  of glucose in your blood. Carb counting is a method for keeping track of how many carbs you eat. Counting carbs is important to keep your blood glucose at a healthy level, especially if you use insulin or take certain oral diabetes medicines. It is important to know how many carbs you can safely have in each meal. This is different for every person. Your dietitian can help you calculate how many carbs you should have at each meal and for each snack. How does alcohol affect me? Alcohol can cause a sudden decrease in blood glucose (hypoglycemia), especially if you use insulin or take certain oral diabetes medicines. Hypoglycemia can be a life-threatening condition. Symptoms of hypoglycemia, such as sleepiness, dizziness, and confusion, are similar to symptoms of having too much alcohol. Do not drink alcohol if: Your health care provider tells you not to drink. You are pregnant, may be pregnant, or are planning to become pregnant. If you drink alcohol: Do not drink on an empty stomach. Limit how much you use to: 0-1 drink a day for women. 0-2 drinks a day for men. Be aware of how much alcohol is in your drink. In the [place] one drink equals one 12 oz bottle of beer (355 mL), one 5 oz glass of wine (148 mL), or one 1 oz glass of hard liquor (44 mL).   Keep yourself hydrated with water, diet soda, or unsweetened iced tea. Keep in mind that regular soda, juice, and other mixers may contain a lot of sugar and must be counted as carbs. What are tips for following this plan? Reading food labels Start by checking the serving size on the "Nutrition Facts" label of packaged foods and drinks. The amount of calories, carbs, fats, and other nutrients listed on the label is based on one serving of the item. Many items contain more than one serving per package. Check the total grams (g) of carbs in one serving. You can calculate the number of servings of carbs in one serving by dividing the total carbs by 15. For  example, if a food has 30 g of total carbs per serving, it would be equal to 2 servings of carbs. Check the number of grams (g) of saturated fats and trans fats in one serving. Choose foods that have a low amount or none of these fats. Check the number of milligrams (mg) of salt (sodium) in one serving. Most people should limit total sodium intake to less than 2,300 mg per day. Always check the nutrition information of foods labeled as "low-fat" or "nonfat." These foods may be higher in added sugar or refined carbs and should be avoided. Talk to your dietitian to identify your daily goals for nutrients listed on the label. Shopping Avoid buying canned, pre-made, or processed foods. These foods tend to be high in fat, sodium, and added sugar. Shop around the outside edge of the grocery store. This is where you will most often find fresh fruits and vegetables, bulk grains, fresh meats, and fresh dairy. Cooking Use low-heat cooking methods, such as baking, instead of high-heat cooking methods like deep frying. Cook using healthy oils, such as olive, canola, or sunflower oil. Avoid cooking with butter, cream, or high-fat meats. Meal planning Eat meals and snacks regularly, preferably at the same times every day. Avoid going long periods of time without eating. Eat foods that are high in fiber, such as fresh fruits, vegetables, beans, and whole grains. Talk with your dietitian about how many servings of carbs you can eat at each meal. Eat 4-6 oz (112-168 g) of lean protein each day, such as lean meat, chicken, fish, eggs, or tofu. One ounce (oz) of lean protein is equal to: 1 oz (28 g) of meat, chicken, or fish. 1 egg.  cup (62 g) of tofu. Eat some foods each day that contain healthy fats, such as avocado, nuts, seeds, and fish. What foods should I eat? Fruits Berries. Apples. Oranges. Peaches. Apricots. Plums. Grapes. Mango. Papaya. Pomegranate. Kiwi. Cherries. Vegetables Lettuce. Spinach. Leafy  greens, including kale, chard, collard greens, and mustard greens. Beets. Cauliflower. Cabbage. Broccoli. Carrots. Green beans. Tomatoes. Peppers. Onions. Cucumbers. Brussels sprouts. Grains Whole grains, such as whole-wheat or whole-grain bread, crackers, tortillas, cereal, and pasta. Unsweetened oatmeal. Quinoa. Brown or wild rice. Meats and other proteins Seafood. Poultry without skin. Lean cuts of poultry and beef. Tofu. Nuts. Seeds. Dairy Low-fat or fat-free dairy products such as milk, yogurt, and cheese. The items listed above may not be a complete list of foods and beverages you can eat. Contact a dietitian for more information. What foods should I avoid? Fruits Fruits canned with syrup. Vegetables Canned vegetables. Frozen vegetables with butter or cream sauce. Grains Refined white flour and flour products such as bread, pasta, snack foods, and cereals. Avoid all processed foods. Meats and other proteins Fatty cuts of meat. Poultry   with skin. Breaded or fried meats. Processed meat. Avoid saturated fats. Dairy Full-fat yogurt, cheese, or milk. Beverages Sweetened drinks, such as soda or iced tea. The items listed above may not be a complete list of foods and beverages you should avoid. Contact a dietitian for more information. Questions to ask a health care provider Do I need to meet with a diabetes educator? Do I need to meet with a dietitian? What number can I call if I have questions? When are the best times to check my blood glucose? Where to find more information: American Diabetes Association: diabetes.org Academy of Nutrition and Dietetics: www.eatright.org National Institute of Diabetes and Digestive and Kidney Diseases: www.niddk.nih.gov Association of Diabetes Care and Education Specialists: www.diabeteseducator.org Summary It is important to have healthy eating habits because your blood sugar (glucose) levels are greatly affected by what you eat and drink. A  healthy meal plan will help you control your blood glucose and maintain a healthy lifestyle. Your health care provider may recommend that you work with a dietitian to make a meal plan that is best for you. Keep in mind that carbohydrates (carbs) and alcohol have immediate effects on your blood glucose levels. It is important to count carbs and to use alcohol carefully. This information is not intended to replace advice given to you by your health care provider. Make sure you discuss any questions you have with your health care provider. Document Revised: 05/03/2019 Document Reviewed: 05/03/2019 Elsevier Patient Education  2021 Elsevier Inc.  

## 2020-12-27 ENCOUNTER — Telehealth: Payer: Self-pay

## 2020-12-27 LAB — LIPID PANEL
Chol/HDL Ratio: 3.4 ratio (ref 0.0–5.0)
Cholesterol, Total: 141 mg/dL (ref 100–199)
HDL: 41 mg/dL (ref >39)
LDL Chol Calc (NIH): 81 mg/dL (ref 0–99)
Triglycerides: 101 mg/dL (ref 0–149)
VLDL Cholesterol Cal: 19 mg/dL (ref 5–40)

## 2020-12-27 LAB — MICROALBUMIN, URINE: Microalbumin, Urine: 984.5 ug/mL

## 2020-12-27 LAB — HEPATITIS C ANTIBODY: Hep C Virus Ab: 0.1 s/co ratio (ref 0.0–0.9)

## 2020-12-27 NOTE — Telephone Encounter (Signed)
OneTouch Ultra strips PA initiated thorough Cover my meds.   KeyMoise Boring - PA Case ID: [id number]

## 2020-12-28 NOTE — Telephone Encounter (Signed)
PA Approved

## 2021-01-01 ENCOUNTER — Encounter (HOSPITAL_COMMUNITY): Payer: Self-pay | Admitting: Nurse Practitioner

## 2021-01-03 ENCOUNTER — Other Ambulatory Visit: Payer: Self-pay | Admitting: Nurse Practitioner

## 2021-01-03 MED ORDER — INSULIN DETEMIR 100 UNIT/ML FLEXPEN: 20.0000 [IU] | PEN_INJECTOR | Freq: Every day | SUBCUTANEOUS | 11 refills | Status: DC

## 2021-01-03 NOTE — Progress Notes (Signed)
levim  [facility] [address] [place], [place]  [address] Phone:  [phone]   Fax:  [phone]

## 2021-02-22 ENCOUNTER — Emergency Department (HOSPITAL_COMMUNITY)
Admission: EM | Admit: 2021-02-22 | Discharge: 2021-02-22 | Disposition: A | Discharge status: Home / Self Care | Payer: Medicare HMO | Attending: Student | Admitting: Student

## 2021-02-22 ENCOUNTER — Encounter (HOSPITAL_COMMUNITY): Payer: Self-pay

## 2021-02-22 ENCOUNTER — Other Ambulatory Visit: Payer: Self-pay

## 2021-02-22 DIAGNOSIS — R109 Unspecified abdominal pain: Secondary | ICD-10-CM | POA: Insufficient documentation

## 2021-02-22 DIAGNOSIS — R1110 Vomiting, unspecified: Secondary | ICD-10-CM | POA: Diagnosis not present

## 2021-02-22 DIAGNOSIS — Z5321 Procedure and treatment not carried out due to patient leaving prior to being seen by health care provider: Secondary | ICD-10-CM | POA: Insufficient documentation

## 2021-02-22 DIAGNOSIS — R112 Nausea with vomiting, unspecified: Secondary | ICD-10-CM | POA: Insufficient documentation

## 2021-02-22 DIAGNOSIS — Z131 Encounter for screening for diabetes mellitus: Secondary | ICD-10-CM | POA: Diagnosis not present

## 2021-02-22 LAB — COMPREHENSIVE METABOLIC PANEL
ALT: 35 U/L (ref 0–44)
AST: 29 U/L (ref 15–41)
Albumin: 4.2 g/dL (ref 3.5–5.0)
Alkaline Phosphatase: 94 U/L (ref 38–126)
Anion gap: 11 (ref 5–15)
BUN: 21 mg/dL — ABNORMAL HIGH (ref 6–20)
CO2: 25 mmol/L (ref 22–32)
Calcium: 9.6 mg/dL (ref 8.9–10.3)
Chloride: 100 mmol/L (ref 98–111)
Creatinine, Ser: 1.6 mg/dL — ABNORMAL HIGH (ref 0.61–1.24)
GFR, Estimated: 54 mL/min — ABNORMAL LOW (ref >60)
Glucose, Bld: 201 mg/dL — ABNORMAL HIGH (ref 70–99)
Potassium: 3.7 mmol/L (ref 3.5–5.1)
Sodium: 136 mmol/L (ref 135–145)
Total Bilirubin: 0.9 mg/dL (ref 0.3–1.2)
Total Protein: 8.3 g/dL — ABNORMAL HIGH (ref 6.5–8.1)

## 2021-02-22 LAB — URINALYSIS, ROUTINE W REFLEX MICROSCOPIC
Bacteria, UA: NONE SEEN
Bilirubin Urine: NEGATIVE
Glucose, UA: NEGATIVE mg/dL
Hgb urine dipstick: NEGATIVE
Ketones, ur: NEGATIVE mg/dL
Leukocytes,Ua: NEGATIVE
Nitrite: NEGATIVE
Protein, ur: 100 mg/dL — AB
Specific Gravity, Urine: 1.015 (ref 1.005–1.030)
pH: 7 (ref 5.0–8.0)

## 2021-02-22 LAB — CBC WITH DIFFERENTIAL/PLATELET
Abs Immature Granulocytes: 0.03 10*3/uL (ref 0.00–0.07)
Basophils Absolute: 0 10*3/uL (ref 0.0–0.1)
Basophils Relative: 0 %
Eosinophils Absolute: 0.2 10*3/uL (ref 0.0–0.5)
Eosinophils Relative: 2 %
HCT: 44.4 % (ref 39.0–52.0)
Hemoglobin: 14.6 g/dL (ref 13.0–17.0)
Immature Granulocytes: 0 %
Lymphocytes Relative: 14 %
Lymphs Abs: 1.5 10*3/uL (ref 0.7–4.0)
MCH: 30.7 pg (ref 26.0–34.0)
MCHC: 32.9 g/dL (ref 30.0–36.0)
MCV: 93.5 fL (ref 80.0–100.0)
Monocytes Absolute: 0.5 10*3/uL (ref 0.1–1.0)
Monocytes Relative: 5 %
Neutro Abs: 7.9 10*3/uL — ABNORMAL HIGH (ref 1.7–7.7)
Neutrophils Relative %: 79 %
Platelets: 217 10*3/uL (ref 150–400)
RBC: 4.75 MIL/uL (ref 4.22–5.81)
RDW: 14.3 % (ref 11.5–15.5)
WBC: 10.1 10*3/uL (ref 4.0–10.5)
nRBC: 0 % (ref 0.0–0.2)

## 2021-02-22 LAB — CBG MONITORING, ED: Glucose-Capillary: 213 mg/dL — ABNORMAL HIGH (ref 70–99)

## 2021-02-22 LAB — LIPASE, BLOOD: Lipase: 35 U/L (ref 11–51)

## 2021-02-22 MED ORDER — ONDANSETRON 4 MG PO TBDP
4.0000 mg | ORAL_TABLET | Freq: Once | ORAL | Status: AC
  Administered 2021-02-22: 4 mg via ORAL
  Filled 2021-02-22: qty 1

## 2021-02-22 NOTE — ED Triage Notes (Signed)
Pt reports abd pain and emesis for the past 3 days

## 2021-02-22 NOTE — ED Provider Notes (Signed)
Emergency Medicine Provider Triage Evaluation Note  [name] , a 45 y.o. male  was evaluated in triage.  Pt complains of n/v, abd pain. Sxs x3 days. >20 episodes of emesis/day.   Review of Systems  Positive: N/v, abd pain Negative: Fever, urinary sxs, diarrhea  Physical Exam  There were no vitals taken for this visit. Gen:   Awake, no distress   Resp:  Normal effort  MSK:   Moves extremities without difficulty  Other:  Diffuse ttp of the abd, worse on the R side with voluntary guarding.   Medical Decision Making  Medically screening exam initiated at 8:49 AM.  Appropriate orders placed.  O[place] Kinyon was informed that the remainder of the evaluation will be completed by another provider, this initial triage assessment does not replace that evaluation, and the importance of remaining in the ED until their evaluation is complete.  Labs and ct   Alveria Apley, PA-C 02/22/21 0855    Terrilee Files, MD 02/22/21 [phone]

## 2021-02-22 NOTE — ED Notes (Signed)
Pt left AMA after tech encouraged pt to stay and receive care, IV was taken out.

## 2021-02-26 ENCOUNTER — Telehealth: Payer: Self-pay | Admitting: Family Medicine

## 2021-02-26 NOTE — Telephone Encounter (Signed)
Left message for patient to call back and schedule Medicare Annual Wellness Visit    awvi 08/08/15 per [facility] please schedule at anytime with health coach

## 2021-03-05 DIAGNOSIS — E119 Type 2 diabetes mellitus without complications: Secondary | ICD-10-CM | POA: Diagnosis not present

## 2021-03-05 DIAGNOSIS — H401113 Primary open-angle glaucoma, right eye, severe stage: Secondary | ICD-10-CM | POA: Diagnosis not present

## 2021-03-05 DIAGNOSIS — H401121 Primary open-angle glaucoma, left eye, mild stage: Secondary | ICD-10-CM | POA: Diagnosis not present

## 2021-04-09 DIAGNOSIS — H47011 Ischemic optic neuropathy, right eye: Secondary | ICD-10-CM | POA: Diagnosis not present

## 2021-04-09 DIAGNOSIS — H401221 Low-tension glaucoma, left eye, mild stage: Secondary | ICD-10-CM | POA: Diagnosis not present

## 2021-04-09 DIAGNOSIS — H401213 Low-tension glaucoma, right eye, severe stage: Secondary | ICD-10-CM | POA: Diagnosis not present

## 2021-06-10 ENCOUNTER — Emergency Department (HOSPITAL_COMMUNITY)
Admission: EM | Admit: 2021-06-10 | Discharge: 2021-06-10 | Disposition: A | Discharge status: Home / Self Care | Payer: Medicare HMO | Attending: Emergency Medicine | Admitting: Emergency Medicine

## 2021-06-10 DIAGNOSIS — R109 Unspecified abdominal pain: Secondary | ICD-10-CM | POA: Insufficient documentation

## 2021-06-10 DIAGNOSIS — Z5321 Procedure and treatment not carried out due to patient leaving prior to being seen by health care provider: Secondary | ICD-10-CM | POA: Diagnosis not present

## 2021-06-10 NOTE — ED Notes (Signed)
PT called 3x for triage w/o response.

## 2021-06-28 ENCOUNTER — Ambulatory Visit: Payer: Medicare HMO | Admitting: Nurse Practitioner

## 2021-07-25 ENCOUNTER — Other Ambulatory Visit: Payer: Self-pay

## 2021-07-25 ENCOUNTER — Emergency Department (HOSPITAL_COMMUNITY)
Admission: EM | Admit: 2021-07-25 | Discharge: 2021-07-25 | Disposition: A | Discharge status: Home / Self Care | Payer: Medicare HMO | Attending: Physician Assistant | Admitting: Physician Assistant

## 2021-07-25 DIAGNOSIS — Z5321 Procedure and treatment not carried out due to patient leaving prior to being seen by health care provider: Secondary | ICD-10-CM | POA: Insufficient documentation

## 2021-07-25 DIAGNOSIS — R112 Nausea with vomiting, unspecified: Secondary | ICD-10-CM | POA: Diagnosis not present

## 2021-07-25 DIAGNOSIS — R1011 Right upper quadrant pain: Secondary | ICD-10-CM | POA: Diagnosis not present

## 2021-07-25 DIAGNOSIS — R1031 Right lower quadrant pain: Secondary | ICD-10-CM | POA: Insufficient documentation

## 2021-07-25 LAB — COMPREHENSIVE METABOLIC PANEL
ALT: 17 U/L (ref 0–44)
AST: 19 U/L (ref 15–41)
Albumin: 3.8 g/dL (ref 3.5–5.0)
Alkaline Phosphatase: 87 U/L (ref 38–126)
Anion gap: 11 (ref 5–15)
BUN: 18 mg/dL (ref 6–20)
CO2: 28 mmol/L (ref 22–32)
Calcium: 9.4 mg/dL (ref 8.9–10.3)
Chloride: 98 mmol/L (ref 98–111)
Creatinine, Ser: 1.74 mg/dL — ABNORMAL HIGH (ref 0.61–1.24)
GFR, Estimated: 49 mL/min — ABNORMAL LOW (ref >60)
Glucose, Bld: 192 mg/dL — ABNORMAL HIGH (ref 70–99)
Potassium: 3.9 mmol/L (ref 3.5–5.1)
Sodium: 137 mmol/L (ref 135–145)
Total Bilirubin: 0.6 mg/dL (ref 0.3–1.2)
Total Protein: 7.9 g/dL (ref 6.5–8.1)

## 2021-07-25 LAB — CBC WITH DIFFERENTIAL/PLATELET
Abs Immature Granulocytes: 0.02 10*3/uL (ref 0.00–0.07)
Basophils Absolute: 0 10*3/uL (ref 0.0–0.1)
Basophils Relative: 0 %
Eosinophils Absolute: 0.1 10*3/uL (ref 0.0–0.5)
Eosinophils Relative: 1 %
HCT: 42.5 % (ref 39.0–52.0)
Hemoglobin: 14 g/dL (ref 13.0–17.0)
Immature Granulocytes: 0 %
Lymphocytes Relative: 18 %
Lymphs Abs: 1.6 10*3/uL (ref 0.7–4.0)
MCH: 30.6 pg (ref 26.0–34.0)
MCHC: 32.9 g/dL (ref 30.0–36.0)
MCV: 92.8 fL (ref 80.0–100.0)
Monocytes Absolute: 0.4 10*3/uL (ref 0.1–1.0)
Monocytes Relative: 5 %
Neutro Abs: 6.3 10*3/uL (ref 1.7–7.7)
Neutrophils Relative %: 76 %
Platelets: 214 10*3/uL (ref 150–400)
RBC: 4.58 MIL/uL (ref 4.22–5.81)
RDW: 13.2 % (ref 11.5–15.5)
WBC: 8.4 10*3/uL (ref 4.0–10.5)
nRBC: 0 % (ref 0.0–0.2)

## 2021-07-25 LAB — URINALYSIS, ROUTINE W REFLEX MICROSCOPIC
Bacteria, UA: NONE SEEN
Bilirubin Urine: NEGATIVE
Glucose, UA: NEGATIVE mg/dL
Hgb urine dipstick: NEGATIVE
Ketones, ur: NEGATIVE mg/dL
Leukocytes,Ua: NEGATIVE
Nitrite: NEGATIVE
Protein, ur: 100 mg/dL — AB
Specific Gravity, Urine: 1.016 (ref 1.005–1.030)
pH: 6 (ref 5.0–8.0)

## 2021-07-25 LAB — LIPASE, BLOOD: Lipase: 32 U/L (ref 11–51)

## 2021-07-25 MED ORDER — ONDANSETRON 4 MG PO TBDP
4.0000 mg | ORAL_TABLET | Freq: Once | ORAL | Status: AC
  Administered 2021-07-25: 4 mg via ORAL
  Filled 2021-07-25: qty 1

## 2021-07-25 MED ORDER — ACETAMINOPHEN 325 MG PO TABS
650.0000 mg | ORAL_TABLET | Freq: Once | ORAL | Status: DC
  Filled 2021-07-25: qty 2

## 2021-07-25 NOTE — ED Triage Notes (Signed)
Pt reported to ED with c/o pain to upper and lower right abdomen x3 days accompanied with nausea and vomiting. Denies any CP or SHOB. Normal BM this am.

## 2021-07-25 NOTE — ED Notes (Signed)
Pt no answer to give Tylenol, x 3

## 2021-07-25 NOTE — ED Notes (Signed)
Pt didn't answer when called for vitals x3

## 2021-08-01 ENCOUNTER — Telehealth: Payer: Self-pay | Admitting: Family

## 2021-08-01 NOTE — Telephone Encounter (Signed)
Pt called and states his amputation has a smell and is very painful. He thinks it may be infected again. Wondering if he can get on Erin sch tomorrow   [id number]

## 2021-08-01 NOTE — Telephone Encounter (Signed)
Can you please call pt to schedule this apt for tomorrow? Thank you

## 2021-08-01 NOTE — Telephone Encounter (Signed)
I opened up a spot

## 2021-08-02 ENCOUNTER — Ambulatory Visit: Payer: Medicare HMO | Admitting: Family

## 2021-08-06 ENCOUNTER — Encounter: Payer: Self-pay | Admitting: Family

## 2021-08-06 ENCOUNTER — Ambulatory Visit (INDEPENDENT_AMBULATORY_CARE_PROVIDER_SITE_OTHER): Payer: Medicare HMO | Admitting: Family

## 2021-08-06 DIAGNOSIS — S88119S Complete traumatic amputation at level between knee and ankle, unspecified lower leg, sequela: Secondary | ICD-10-CM

## 2021-08-06 DIAGNOSIS — Z89511 Acquired absence of right leg below knee: Secondary | ICD-10-CM

## 2021-08-06 MED ORDER — SULFAMETHOXAZOLE-TRIMETHOPRIM 800-160 MG PO TABS: 1.0000 | ORAL_TABLET | Freq: Two times a day (BID) | ORAL | 0 refills | Status: DC
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
## 2021-08-27 ENCOUNTER — Ambulatory Visit: Payer: Medicare HMO | Admitting: Family

## 2021-08-30 ENCOUNTER — Ambulatory Visit (INDEPENDENT_AMBULATORY_CARE_PROVIDER_SITE_OTHER): Payer: Medicare HMO | Admitting: Family

## 2021-08-30 ENCOUNTER — Encounter: Payer: Self-pay | Admitting: Family

## 2021-08-30 DIAGNOSIS — Z89511 Acquired absence of right leg below knee: Secondary | ICD-10-CM
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
## 2021-12-31 ENCOUNTER — Emergency Department (HOSPITAL_COMMUNITY): Admission: EM | Admit: 2021-12-31 | Discharge: 2021-12-31 | Discharge status: Absent without leave | Payer: Medicare HMO

## 2021-12-31 NOTE — ED Notes (Signed)
Pt called for triage, no answer x2 

## 2022-01-02 ENCOUNTER — Encounter (HOSPITAL_COMMUNITY): Payer: Self-pay

## 2022-01-02 ENCOUNTER — Emergency Department (HOSPITAL_COMMUNITY)
Admission: EM | Admit: 2022-01-02 | Discharge: 2022-01-02 | Disposition: A | Discharge status: Home / Self Care | Payer: Medicare HMO | Attending: Emergency Medicine | Admitting: Emergency Medicine

## 2022-01-02 ENCOUNTER — Emergency Department (HOSPITAL_COMMUNITY): Payer: Medicare HMO

## 2022-01-02 ENCOUNTER — Other Ambulatory Visit: Payer: Self-pay

## 2022-01-02 DIAGNOSIS — R112 Nausea with vomiting, unspecified: Secondary | ICD-10-CM | POA: Diagnosis not present

## 2022-01-02 DIAGNOSIS — D72829 Elevated white blood cell count, unspecified: Secondary | ICD-10-CM | POA: Diagnosis not present

## 2022-01-02 DIAGNOSIS — E1165 Type 2 diabetes mellitus with hyperglycemia: Secondary | ICD-10-CM | POA: Diagnosis not present

## 2022-01-02 DIAGNOSIS — R1084 Generalized abdominal pain: Secondary | ICD-10-CM | POA: Diagnosis not present

## 2022-01-02 DIAGNOSIS — Z8719 Personal history of other diseases of the digestive system: Secondary | ICD-10-CM | POA: Diagnosis not present

## 2022-01-02 DIAGNOSIS — I129 Hypertensive chronic kidney disease with stage 1 through stage 4 chronic kidney disease, or unspecified chronic kidney disease: Secondary | ICD-10-CM | POA: Diagnosis not present

## 2022-01-02 DIAGNOSIS — N183 Chronic kidney disease, stage 3 unspecified: Secondary | ICD-10-CM | POA: Diagnosis not present

## 2022-01-02 DIAGNOSIS — Z79899 Other long term (current) drug therapy: Secondary | ICD-10-CM | POA: Insufficient documentation

## 2022-01-02 DIAGNOSIS — Z794 Long term (current) use of insulin: Secondary | ICD-10-CM | POA: Insufficient documentation

## 2022-01-02 DIAGNOSIS — Q632 Ectopic kidney: Secondary | ICD-10-CM | POA: Diagnosis not present

## 2022-01-02 DIAGNOSIS — R109 Unspecified abdominal pain: Secondary | ICD-10-CM | POA: Diagnosis not present

## 2022-01-02 DIAGNOSIS — E1122 Type 2 diabetes mellitus with diabetic chronic kidney disease: Secondary | ICD-10-CM | POA: Diagnosis not present

## 2022-01-02 DIAGNOSIS — R1011 Right upper quadrant pain: Secondary | ICD-10-CM | POA: Diagnosis not present

## 2022-01-02 LAB — URINALYSIS, ROUTINE W REFLEX MICROSCOPIC
Bacteria, UA: NONE SEEN
Bilirubin Urine: NEGATIVE
Glucose, UA: >=500 mg/dL — AB
Ketones, ur: NEGATIVE mg/dL
Leukocytes,Ua: NEGATIVE
Nitrite: NEGATIVE
Protein, ur: 100 mg/dL — AB
Specific Gravity, Urine: 1.016 (ref 1.005–1.030)
pH: 6 (ref 5.0–8.0)

## 2022-01-02 LAB — COMPREHENSIVE METABOLIC PANEL
ALT: 37 U/L (ref 0–44)
AST: 26 U/L (ref 15–41)
Albumin: 3.9 g/dL (ref 3.5–5.0)
Alkaline Phosphatase: 101 U/L (ref 38–126)
Anion gap: 10 (ref 5–15)
BUN: 17 mg/dL (ref 6–20)
CO2: 23 mmol/L (ref 22–32)
Calcium: 9.1 mg/dL (ref 8.9–10.3)
Chloride: 100 mmol/L (ref 98–111)
Creatinine, Ser: 1.84 mg/dL — ABNORMAL HIGH (ref 0.61–1.24)
GFR, Estimated: 46 mL/min — ABNORMAL LOW (ref >60)
Glucose, Bld: 337 mg/dL — ABNORMAL HIGH (ref 70–99)
Potassium: 3.7 mmol/L (ref 3.5–5.1)
Sodium: 133 mmol/L — ABNORMAL LOW (ref 135–145)
Total Bilirubin: 0.8 mg/dL (ref 0.3–1.2)
Total Protein: 7.8 g/dL (ref 6.5–8.1)

## 2022-01-02 LAB — CBC WITH DIFFERENTIAL/PLATELET
Abs Immature Granulocytes: 0.04 10*3/uL (ref 0.00–0.07)
Basophils Absolute: 0 10*3/uL (ref 0.0–0.1)
Basophils Relative: 0 %
Eosinophils Absolute: 0 10*3/uL (ref 0.0–0.5)
Eosinophils Relative: 0 %
HCT: 43.8 % (ref 39.0–52.0)
Hemoglobin: 14.7 g/dL (ref 13.0–17.0)
Immature Granulocytes: 0 %
Lymphocytes Relative: 9 %
Lymphs Abs: 1 10*3/uL (ref 0.7–4.0)
MCH: 30.9 pg (ref 26.0–34.0)
MCHC: 33.6 g/dL (ref 30.0–36.0)
MCV: 92.2 fL (ref 80.0–100.0)
Monocytes Absolute: 0.3 10*3/uL (ref 0.1–1.0)
Monocytes Relative: 3 %
Neutro Abs: 10.3 10*3/uL — ABNORMAL HIGH (ref 1.7–7.7)
Neutrophils Relative %: 88 %
Platelets: 210 10*3/uL (ref 150–400)
RBC: 4.75 MIL/uL (ref 4.22–5.81)
RDW: 13.6 % (ref 11.5–15.5)
WBC: 11.8 10*3/uL — ABNORMAL HIGH (ref 4.0–10.5)
nRBC: 0 % (ref 0.0–0.2)

## 2022-01-02 LAB — LIPASE, BLOOD: Lipase: 28 U/L (ref 11–51)

## 2022-01-02 LAB — MAGNESIUM: Magnesium: 1.7 mg/dL (ref 1.7–2.4)

## 2022-01-02 LAB — CBG MONITORING, ED: Glucose-Capillary: 346 mg/dL — ABNORMAL HIGH (ref 70–99)

## 2022-01-02 MED ORDER — MORPHINE SULFATE (PF) 4 MG/ML IV SOLN
4.0000 mg | Freq: Once | INTRAVENOUS | Status: DC
  Filled 2022-01-02: qty 1

## 2022-01-02 MED ORDER — ONDANSETRON 4 MG PO TBDP: 4.0000 mg | ORAL_TABLET | Freq: Three times a day (TID) | ORAL | 0 refills | Status: AC | PRN

## 2022-01-02 MED ORDER — SUCRALFATE 1 G PO TABS: 1.0000 g | ORAL_TABLET | Freq: Three times a day (TID) | ORAL | 0 refills | Status: DC

## 2022-01-02 MED ORDER — MORPHINE SULFATE (PF) 4 MG/ML IV SOLN: 4.0000 mg | Freq: Once | INTRAVENOUS | Status: DC

## 2022-01-02 MED ORDER — PANTOPRAZOLE SODIUM 20 MG PO TBEC: 20.0000 mg | DELAYED_RELEASE_TABLET | Freq: Every day | ORAL | 0 refills | Status: DC

## 2022-01-02 MED ORDER — MORPHINE SULFATE (PF) 4 MG/ML IV SOLN
4.0000 mg | Freq: Once | INTRAVENOUS | Status: AC
  Administered 2022-01-02: 4 mg via INTRAVENOUS

## 2022-01-02 MED ORDER — ONDANSETRON 4 MG PO TBDP
4.0000 mg | ORAL_TABLET | Freq: Once | ORAL | Status: AC
Start: 2022-01-02 — End: 2022-01-02
  Administered 2022-01-02: 4 mg via ORAL
  Filled 2022-01-02: qty 1

## 2022-01-02 NOTE — ED Provider Notes (Signed)
[facility] EMERGENCY DEPARTMENT Provider Note   CSN: [id number] Arrival date & time: 01/02/22  4356     History  Chief Complaint  Patient presents with   Vomiting    [name] is a 46 y.o. male with a history of hypertension, CKD stage III, polysubstance use, diabetes mellitus type 2, pancreatitis, status post cholecystectomy.  Presents emergency department with chief complaint of abdominal pain, nausea, and vomiting.  Patient reports that his abdominal pain began 3 days ago.  Pain has been intermittent since then.  Patient reports that pain is located throughout his entire abdomen worse to the right lower quadrant.  Patient reports the pain is worse when laying flat on his back.  Patient denies any other aggravating factors.  Patient has not tried any modalities to alleviate his symptoms.  Patient endorses nausea and vomiting.  Reports that he has vomited more than 20 times in the last 24 hours.  Patient describes emesis as clear.  Denies any hematemesis or coffee-ground emesis.  Patient additionally endorses chills.  Denies any fever, constipation, diarrhea, blood in stool, melena, dysuria, hematuria, urinary urgency, urinary frequency, swelling or tenderness to genitals, genital sores or lesions, penile discharge, illicit drug use, alcohol use, frequent NSAID use.    HPI     Home Medications Prior to Admission medications   Medication Sig Start Date End Date Taking? Authorizing Provider  amLODipine (NORVASC) 10 MG tablet Take 1 tablet (10 mg total) by mouth daily. 12/26/20 12/21/21  Vevelyn Francois, NP  atorvastatin (LIPITOR) 10 MG tablet Take 1 tablet (10 mg total) by mouth daily. 12/26/20   Vevelyn Francois, NP  blood glucose meter kit and supplies KIT Dispense based on patient and insurance preference. Use up to four times daily as directed. (FOR ICD-9 250.00, 250.01). 05/16/17   Rai, Vernelle Emerald, MD  dicyclomine (BENTYL) 20 MG tablet Take 1 tablet (20 mg  total) by mouth 2 (two) times daily. 09/15/20   Alfredia Client, PA-C  famotidine (PEPCID) 20 MG tablet Take 1 tablet (20 mg total) by mouth 2 (two) times daily. Patient taking differently: Take 20 mg by mouth 2 (two) times daily as needed for heartburn. 04/24/20   Azzie Glatter, FNP  furosemide (LASIX) 20 MG tablet Take 1 tablet (20 mg total) by mouth daily as needed. Patient taking differently: Take 20 mg by mouth daily. 04/24/20   Azzie Glatter, FNP  glucose blood (ONETOUCH ULTRA) test strip Use up to four times daily as directed. 12/26/20   Vevelyn Francois, NP  ibuprofen (ADVIL) 800 MG tablet Take 1 tablet (800 mg total) by mouth every 8 (eight) hours as needed. 06/27/20   Azzie Glatter, FNP  insulin aspart (NOVOLOG FLEXPEN) 100 UNIT/ML FlexPen Inject 8 Units into the skin 3 (three) times daily with meals. 12/26/20 12/26/21  Vevelyn Francois, NP  insulin detemir (LEVEMIR) 100 UNIT/ML FlexPen Inject 20 Units into the skin at bedtime. 01/03/21   Vevelyn Francois, NP  Insulin Syringes, Disposable, U-100 0.5 ML MISC Use with lantus and novolog vials. 05/16/17   Rai, Vernelle Emerald, MD  Lancets MISC 1 each by Does not apply route 3 (three) times daily as needed. 12/22/19   Azzie Glatter, FNP  metoCLOPramide (REGLAN) 10 MG tablet Take 1 tablet (10 mg total) by mouth every 8 (eight) hours as needed for up to 5 days for nausea or vomiting. 08/20/19 03/28/21  Madilyn Hook A, PA-C  omeprazole (PRILOSEC OTC) 20  MG tablet Take 1 tablet (20 mg total) by mouth daily. 09/11/20   Hayden Rasmussen, MD  ondansetron (ZOFRAN ODT) 4 MG disintegrating tablet Take 1 tablet (4 mg total) by mouth every 8 (eight) hours as needed for nausea or vomiting. 09/11/20   Hayden Rasmussen, MD  pantoprazole (PROTONIX) 20 MG tablet Take 1 tablet (20 mg total) by mouth daily. 10/30/20 11/29/20  Breck Coons, MD  sucralfate (CARAFATE) 1 g tablet Take 1 tablet (1 g total) by mouth 4 (four) times daily -  with meals and at bedtime. 09/13/20    Carmin Muskrat, MD  sulfamethoxazole-trimethoprim (BACTRIM DS) 800-160 MG tablet Take 1 tablet by mouth 2 (two) times daily. 08/06/21   Suzan Slick, NP      Allergies    Patient has no known allergies.    Review of Systems   Review of Systems  Constitutional:  Positive for chills. Negative for fever.  Eyes:  Negative for visual disturbance.  Respiratory:  Negative for shortness of breath.   Cardiovascular:  Negative for chest pain.  Gastrointestinal:  Positive for abdominal pain, nausea and vomiting. Negative for abdominal distention, anal bleeding, blood in stool, constipation, diarrhea and rectal pain.  Genitourinary:  Negative for difficulty urinating, dysuria, flank pain, frequency, genital sores, hematuria, penile discharge, penile pain, penile swelling, scrotal swelling, testicular pain and urgency.  Musculoskeletal:  Negative for back pain and neck pain.  Skin:  Negative for color change and rash.  Neurological:  Negative for dizziness, syncope, light-headedness and headaches.  Psychiatric/Behavioral:  Negative for confusion.     Physical Exam Updated Vital Signs BP (!) 153/104 (BP Location: Left Arm)   Pulse 93   Temp 98.9 F (37.2 C) (Oral)   Resp 20   Ht $R'6\' 1"'ds$  (1.854 m)   Wt 108.9 kg   SpO2 96%   BMI 31.66 kg/m  Physical Exam Vitals and nursing note reviewed.  Constitutional:      General: He is not in acute distress.    Appearance: He is not ill-appearing, toxic-appearing or diaphoretic.  HENT:     Head: Normocephalic.  Eyes:     General: No scleral icterus.       Right eye: No discharge.        Left eye: No discharge.  Cardiovascular:     Rate and Rhythm: Normal rate.  Pulmonary:     Effort: Pulmonary effort is normal.  Abdominal:     General: Abdomen is flat. Bowel sounds are normal. There is no distension. There are no signs of injury.     Palpations: Abdomen is soft. There is no mass or pulsatile mass.     Tenderness: There is abdominal  tenderness. There is guarding. There is no right CVA tenderness, left CVA tenderness or rebound.     Hernia: There is no hernia in the umbilical area or ventral area.     Comments: Diffuse tenderness throughout entire abdomen with increased tenderness to epigastric area and right lower quadrant.  Guarding to palpation of right lower quadrant.  Skin:    General: Skin is warm and dry.  Neurological:     General: No focal deficit present.     Mental Status: He is alert.  Psychiatric:        Behavior: Behavior is cooperative.     ED Results / Procedures / Treatments   Labs (all labs ordered are listed, but only abnormal results are displayed) Labs Reviewed  COMPREHENSIVE METABOLIC PANEL - Abnormal;  Notable for the following components:      Result Value   Sodium 133 (*)    Glucose, Bld 337 (*)    Creatinine, Ser 1.84 (*)    GFR, Estimated 46 (*)    All other components within normal limits  CBC WITH DIFFERENTIAL/PLATELET - Abnormal; Notable for the following components:   WBC 11.8 (*)    Neutro Abs 10.3 (*)    All other components within normal limits  URINALYSIS, ROUTINE W REFLEX MICROSCOPIC - Abnormal; Notable for the following components:   Glucose, UA >=500 (*)    Hgb urine dipstick SMALL (*)    Protein, ur 100 (*)    All other components within normal limits  CBG MONITORING, ED - Abnormal; Notable for the following components:   Glucose-Capillary 346 (*)    All other components within normal limits  LIPASE, BLOOD  MAGNESIUM    EKG None  Radiology CT ABDOMEN PELVIS WO CONTRAST  Result Date: 01/02/2022 CLINICAL DATA:  46 year old male with history of right-sided abdominal pain for the past 3 days with some associated nausea and emesis. Small amount of hematemesis. EXAM: CT ABDOMEN AND PELVIS WITHOUT CONTRAST TECHNIQUE: Multidetector CT imaging of the abdomen and pelvis was performed following the standard protocol without IV contrast. RADIATION DOSE REDUCTION: This exam  was performed according to the departmental dose-optimization program which includes automated exposure control, adjustment of the mA and/or kV according to patient size and/or use of iterative reconstruction technique. COMPARISON:  CT of the abdomen and pelvis 09/11/2020. FINDINGS: Lower chest: Unremarkable. Hepatobiliary: No suspicious cystic or solid hepatic lesions are confidently identified on today's noncontrast CT examination. Status post cholecystectomy. Pancreas: No definite pancreatic mass or peripancreatic fluid collections or inflammatory changes are noted on today's noncontrast CT examination. Spleen: Unremarkable. Adrenals/Urinary Tract: No calcifications are noted within the collecting system of either kidney, along the course of either ureter, or within the lumen of the urinary bladder. Right kidney is in a low position and slightly rotated (ptotic kidney). Left kidney and bilateral adrenal glands are otherwise normal in appearance. No hydroureteronephrosis. Urinary bladder is normal in appearance. Stomach/Bowel: Unenhanced appearance of the stomach is normal. No pathologic dilatation of small bowel or colon. Normal appendix. Vascular/Lymphatic: No atherosclerotic calcifications are noted in the abdominal aorta or pelvic vasculature. No lymphadenopathy noted in the abdomen or pelvis. Reproductive: Prostate gland and seminal vesicles are unremarkable in appearance. Other: No significant volume of ascites.  No pneumoperitoneum. Musculoskeletal: There are no aggressive appearing lytic or blastic lesions noted in the visualized portions of the skeleton. IMPRESSION: 1. No acute findings are noted in the abdomen or pelvis to account for the patient's symptoms. Electronically Signed   By: Vinnie Langton [name]   On: 01/02/2022 05:46   US Abdomen Limited RUQ (LIVER/GB)  Result Date: 01/02/2022 CLINICAL DATA:  History of prior cholecystectomy, presenting with right upper quadrant pain. EXAM: ULTRASOUND  ABDOMEN LIMITED RIGHT UPPER QUADRANT COMPARISON:  September 13, 2020 FINDINGS: Gallbladder: The gallbladder is surgically absent. Common bile duct: Diameter: 3.2 mm Liver: No focal lesion identified. Within normal limits in parenchymal echogenicity. Portal vein is patent on color Doppler imaging with normal direction of blood flow towards the liver. Other: None. IMPRESSION: 1. Findings consistent with history of prior cholecystectomy. 2. Unremarkable ultrasonographic appearance of the liver and common bile duct. Electronically Signed   By: Virgina Norfolk [name]   On: 01/02/2022 03:56    Procedures Procedures    Medications Ordered in ED Medications  ondansetron (ZOFRAN-ODT) disintegrating tablet 4 mg (4 mg Oral Given 01/02/22 0310)  morphine (PF) 4 MG/ML injection 4 mg (4 mg Intravenous Given 01/02/22 0531)    ED Course/ Medical Decision Making/ A&P                           Medical Decision Making Amount and/or Complexity of Data Reviewed Radiology: ordered.  Risk Prescription drug management.   Alert 46 year old male in no acute distress, nontoxic-appearing.  Presents the emergency department complaint of abdominal pain, nausea, and vomiting.  Information was obtained from patient.  I reviewed patient's past medical records including previous prior notes, labs, and imaging.  Patient has medical history as outlined HPI was complicates his care.  Lab work and ultrasound imaging were initiated while patient was in triage.  At that time patient was primarily complaining of epigastric and right upper quadrant tenderness.  On my exam now patient is complaining of pain throughout entire abdomen with guarding to palpation of right lower quadrant.  Will obtain CT abdomen pelvis to look for possible appendicitis.  Patient given morphine for pain management.  I personally viewed and interpret patient's lab results.  Pertinent findings include: -Creatinine 1.84, appears baseline for patient -Glucose  337, no signs of DKA -Leukocytosis 11.8 -Lipase within normal limits -Urinalysis shows no signs of infection  Ultrasound imaging of right upper quadrant shows findings consistent with prior cholecystectomy, unremarkable appearance of liver and common bile duct.  I personally viewed and interpreted patient CT imaging.  Agree with radiology interpretation of no acute findings within the abdomen or pelvis.  On serial reexamination abdomen remains soft, nondistended, with improvement in tenderness.  Patient is able to tolerate p.o. intake without difficulty.  Will discharge patient with prescription for Zofran, Protonix, and Carafate.  Patient given patient to follow-up with gastroenterology in the outpatient setting.  Per chart review patient was previously prescribed PPI medication however states he is no longer taking this medication.  Based on patient's chief complaint, I considered admission might be necessary, however after reassuring ED workup feel patient is reasonable for discharge.  Discussed results, findings, treatment and follow up. Patient advised of return precautions. Patient verbalized understanding and agreed with plan.  Portions of this note were generated with Lobbyist. Dictation errors may occur despite best attempts at proofreading.           Final Clinical Impression(s) / ED Diagnoses Final diagnoses:  Generalized abdominal pain  Nausea and vomiting, unspecified vomiting type    Rx / DC Orders ED Discharge Orders          Ordered    ondansetron (ZOFRAN-ODT) 4 MG disintegrating tablet  Every 8 hours PRN        01/02/22 0624    sucralfate (CARAFATE) 1 g tablet  3 times daily with meals & bedtime        01/02/22 0624    pantoprazole (PROTONIX) 20 MG tablet  Daily        01/02/22 0624              Loni Beckwith, PA-C 84/13/24 4010    Delora Fuel, MD 27/25/36 [phone]

## 2022-01-02 NOTE — ED Triage Notes (Signed)
Generalized abdominal pain and vomiting x 3 days after eating pizza.

## 2022-01-02 NOTE — ED Notes (Signed)
Pt provided written and verbal d/c instructions. PT rx sent to pharmacy. Pt verbalizes understanding. Pt denies any needs. Pt reports having a ride home. Pt left dept. Via ambulation

## 2022-01-02 NOTE — ED Provider Triage Note (Addendum)
Emergency Medicine Provider Triage Evaluation Note  Kamrin Sibley , a 46 y.o. male  was evaluated in triage.  Pt complains of right upper quadrant epigastric pain x3 days with associated nausea with greater than 20 episodes of vomiting daily with small bright red blood streaks today without bilious emesis.  No history of the same.  Patient is an insulin dependent diabetic.  Review of Systems  Positive: Nausea vomiting abdominal pain Negative: Diarrhea, chills, fevers  Physical Exam  BP (!) 153/104 (BP Location: Left Arm)   Pulse 93   Temp 98.9 F (37.2 C) (Oral)   Resp 20   Ht 6\' 1"  (1.854 m)   Wt 108.9 kg   SpO2 96%   BMI 31.66 kg/m  Gen:   Awake, no distress   Resp:  Normal effort  MSK:   Moves extremities without difficulty  Other:  Exquisite right upper quadrant tenderness palpation with positive Murphy sign, mild epigastric tenderness palpation.  Left-sided CVAT.  Medical Decision Making  Medically screening exam initiated at 3:05 AM.  Appropriate orders placed.  [name] was informed that the remainder of the evaluation will be completed by another provider, this initial triage assessment does not replace that evaluation, and the importance of remaining in the ED until their evaluation is complete.  This chart was dictated using voice recognition software, Dragon. Despite the best efforts of this provider to proofread and correct errors, errors may still occur which can change documentation meaning.    [name], PA-C 01/02/22 0309    Shanikqua Zarzycki, 01/04/22, PA-C 01/02/22 [phone]

## 2022-01-02 NOTE — Discharge Instructions (Addendum)
You came to the emergency department today to be evaluated for your nausea, vomiting, and abdominal pain.  Your lab work and CT imaging were reassuring.  I have given you prescription for antinausea medication called Zofran.  Additionally have given you prescriptions for Protonix and Carafate, please take these as prescribed.  Please follow-up with the gastroenterology group listed on this paperwork for repeat evaluation.  Get help right away if: Your pain does not go away as soon as your health care provider told you to expect. You cannot stop vomiting. Your pain is only in areas of the abdomen, such as the right side or the left lower portion of the abdomen. Pain on the right side could be caused by appendicitis. You have bloody or black stools, or stools that look like tar. You have severe pain, cramping, or bloating in your abdomen. You have signs of dehydration, such as: Dark urine, very little urine, or no urine. Cracked lips. Dry mouth. Sunken eyes. Sleepiness. Weakness. You have trouble breathing or chest pain.

## 2022-02-04 ENCOUNTER — Other Ambulatory Visit: Payer: Self-pay | Admitting: Nurse Practitioner

## 2022-02-04 DIAGNOSIS — I10 Essential (primary) hypertension: Secondary | ICD-10-CM

## 2022-02-05 ENCOUNTER — Telehealth: Payer: Self-pay | Admitting: Family Medicine

## 2022-02-05 NOTE — Telephone Encounter (Signed)
Refill request for amlodipine and atorvastatin.  Pt has appoint with Tonya 03/13/22 at 10 am

## 2022-03-13 ENCOUNTER — Ambulatory Visit: Payer: Self-pay | Admitting: Nurse Practitioner

## 2022-04-14 ENCOUNTER — Ambulatory Visit (INDEPENDENT_AMBULATORY_CARE_PROVIDER_SITE_OTHER): Payer: Medicare HMO | Admitting: Nurse Practitioner

## 2022-04-14 ENCOUNTER — Encounter: Payer: Self-pay | Admitting: Nurse Practitioner

## 2022-04-14 DIAGNOSIS — I10 Essential (primary) hypertension: Secondary | ICD-10-CM

## 2022-04-14 DIAGNOSIS — E119 Type 2 diabetes mellitus without complications: Secondary | ICD-10-CM | POA: Diagnosis not present

## 2022-04-14 LAB — POCT GLYCOSYLATED HEMOGLOBIN (HGB A1C): Hemoglobin A1C: 11.4 % — AB (ref 4.0–5.6)

## 2022-04-14 MED ORDER — INSULIN DETEMIR 100 UNIT/ML FLEXPEN: 20.0000 [IU] | PEN_INJECTOR | Freq: Every day | SUBCUTANEOUS | 11 refills | Status: DC

## 2022-04-14 MED ORDER — AMLODIPINE BESYLATE 10 MG PO TABS: 10.0000 mg | ORAL_TABLET | Freq: Every day | ORAL | 0 refills | Status: DC

## 2022-04-14 MED ORDER — NOVOLOG FLEXPEN 100 UNIT/ML ~~LOC~~ SOPN: 8.0000 [IU] | PEN_INJECTOR | Freq: Three times a day (TID) | SUBCUTANEOUS | 3 refills | Status: DC

## 2022-04-14 MED ORDER — ATORVASTATIN CALCIUM 10 MG PO TABS: 10.0000 mg | ORAL_TABLET | Freq: Every day | ORAL | 0 refills | Status: DC

## 2022-04-14 NOTE — Patient Instructions (Signed)
1. Controlled type 2 diabetes mellitus without complication, unspecified whether long term insulin use (HCC)  - insulin aspart (NOVOLOG FLEXPEN) 100 UNIT/ML FlexPen; Inject 8 Units into the skin 3 (three) times daily with meals.  Dispense: 21.6 mL; Refill: 3 - insulin detemir (LEVEMIR) 100 UNIT/ML FlexPen; Inject 20 Units into the skin at bedtime.  Dispense: 15 mL; Refill: 11 - CBC - Comprehensive metabolic panel - Lipid Panel - AMB Referral to Pharmacy Medication Management  2. Hypertension, unspecified type  - amLODipine (NORVASC) 10 MG tablet; Take 1 tablet (10 mg total) by mouth daily.  Dispense: 90 tablet; Refill: 0 - atorvastatin (LIPITOR) 10 MG tablet; Take 1 tablet (10 mg total) by mouth daily.  Dispense: 90 tablet; Refill: 0 - CBC - Comprehensive metabolic panel - Lipid Panel   Follow up:  Follow up in 3 months

## 2022-04-14 NOTE — Assessment & Plan Note (Signed)
-   insulin aspart (NOVOLOG FLEXPEN) 100 UNIT/ML FlexPen; Inject 8 Units into the skin 3 (three) times daily with meals.  Dispense: 21.6 mL; Refill: 3 - insulin detemir (LEVEMIR) 100 UNIT/ML FlexPen; Inject 20 Units into the skin at bedtime.  Dispense: 15 mL; Refill: 11 - CBC - Comprehensive metabolic panel - Lipid Panel - AMB Referral to Pharmacy Medication Management  2. Hypertension, unspecified type  - amLODipine (NORVASC) 10 MG tablet; Take 1 tablet (10 mg total) by mouth daily.  Dispense: 90 tablet; Refill: 0 - atorvastatin (LIPITOR) 10 MG tablet; Take 1 tablet (10 mg total) by mouth daily.  Dispense: 90 tablet; Refill: 0 - CBC - Comprehensive metabolic panel - Lipid Panel   Follow up:  Follow up in 3 months
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
## 2022-04-14 NOTE — Addendum Note (Signed)
Addended by: Elta Guadeloupe on: 04/14/2022 10:43 AM   Modules accepted: Orders

## 2022-04-15 LAB — COMPREHENSIVE METABOLIC PANEL
ALT: 34 IU/L (ref 0–44)
AST: 29 IU/L (ref 0–40)
Albumin/Globulin Ratio: 1.4 (ref 1.2–2.2)
Albumin: 4.6 g/dL (ref 4.1–5.1)
Alkaline Phosphatase: 132 IU/L — ABNORMAL HIGH (ref 44–121)
BUN/Creatinine Ratio: 8 — ABNORMAL LOW (ref 9–20)
BUN: 16 mg/dL (ref 6–24)
Bilirubin Total: 0.3 mg/dL (ref 0.0–1.2)
CO2: 23 mmol/L (ref 20–29)
Calcium: 9.6 mg/dL (ref 8.7–10.2)
Chloride: 99 mmol/L (ref 96–106)
Creatinine, Ser: 1.89 mg/dL — ABNORMAL HIGH (ref 0.76–1.27)
Globulin, Total: 3.2 g/dL (ref 1.5–4.5)
Glucose: 255 mg/dL — ABNORMAL HIGH (ref 70–99)
Potassium: 4.2 mmol/L (ref 3.5–5.2)
Sodium: 137 mmol/L (ref 134–144)
Total Protein: 7.8 g/dL (ref 6.0–8.5)
eGFR: 44 mL/min/{1.73_m2} — ABNORMAL LOW (ref >59)

## 2022-04-15 LAB — LIPID PANEL
Chol/HDL Ratio: 4.6 ratio (ref 0.0–5.0)
Cholesterol, Total: 187 mg/dL (ref 100–199)
HDL: 41 mg/dL (ref >39)
LDL Chol Calc (NIH): 101 mg/dL — ABNORMAL HIGH (ref 0–99)
Triglycerides: 263 mg/dL — ABNORMAL HIGH (ref 0–149)
VLDL Cholesterol Cal: 45 mg/dL — ABNORMAL HIGH (ref 5–40)

## 2022-04-15 LAB — CBC
Hematocrit: 42.3 % (ref 37.5–51.0)
Hemoglobin: 14.5 g/dL (ref 13.0–17.7)
MCH: 30.9 pg (ref 26.6–33.0)
MCHC: 34.3 g/dL (ref 31.5–35.7)
MCV: 90 fL (ref 79–97)
Platelets: 202 10*3/uL (ref 150–450)
RBC: 4.7 x10E6/uL (ref 4.14–5.80)
RDW: 13.3 % (ref 11.6–15.4)
WBC: 8.3 10*3/uL (ref 3.4–10.8)

## 2022-04-16 ENCOUNTER — Telehealth: Payer: Self-pay

## 2022-04-16 NOTE — Progress Notes (Signed)
   Care Guide Note  04/16/2022 Name: [name] MRN: [id number] DOB: 02-Jul-1975  Referred by: Kallie Locks, FNP Reason for referral : Care Coordination (Outreach to schedule referral with Pharm D )   [name] is a 46 y.o. year old male who is a primary care patient of Kallie Locks, FNP. [name] was referred to the pharmacist for assistance related to DM.    Successful contact was made with the patient to discuss pharmacy services including being ready for the pharmacist to call at least 5 minutes before the scheduled appointment time, to have medication bottles and any blood sugar or blood pressure readings ready for review. The patient agreed to meet with the pharmacist via with the pharmacist via telephone visit on 04/29/2022.    Penne Lash, RMA Care Guide [facility] [place], [place] [address] Direct Dial: [phone] [email or web address]

## 2022-04-29 ENCOUNTER — Other Ambulatory Visit: Payer: Medicare HMO | Admitting: Pharmacist

## 2022-04-30 ENCOUNTER — Other Ambulatory Visit: Payer: Medicare HMO | Admitting: Pharmacist

## 2022-04-30 MED ORDER — FREESTYLE LIBRE 3 SENSOR MISC
11 refills | Status: DC
Start: 2022-04-30 — End: 2022-05-21

## 2022-04-30 NOTE — Chronic Care Management (AMB) (Signed)
04/30/2022 Name: [name] MRN: [id number] DOB: Sep 01, 1975  Chief Complaint  Patient presents with   Medication Management    Diabetes    [name] is a 46 y.o. year old male who presented for a telephone visit.   They were referred to the pharmacist by their PCP for assistance in managing diabetes.   Subjective:  Care Team: Primary Care Provider: Lazaro Arms, NP; Next Scheduled Visit: 07/14/2022  Medication Access/Adherence  Current Pharmacy:  [facility] ([place]), [place] - [address] [place] ([place] [address] Phone: [phone] Fax: [phone]  [facility] [place] [facility] [place] [place] [facility] [address] [place] [address] Phone: [phone] Fax: [phone]   Patient reports affordability concerns with their medications: No  Patient reports access/transportation concerns to their pharmacy: No  Patient reports adherence concerns with their medications:  No    Diabetes:  Current medications: Levemir 20 units daily, Novolog 8 units TID Medications tried in the past: glipizide (needed therapy intensification), metformin (no GI upset, just needed therapy intensification.    Current glucose readings: mostly 250s, checks every other day. Highest number seen was 300.   Patient denies hypoglycemic s/sx including dizziness, shakiness, sweating. Patient reports hyperglycemic symptoms including polyuria, polydipsia, polyphagia, nocturia, neuropathy, blurred vision.  Current meal patterns: meals per day vary, usually 1-2 meals/day - Breakfast: usually skips - Snacks fruits, veggies - Drinks soda  Current physical activity: Not physical activity, does not work. Limited walking.   Health Maintenance  Health Maintenance Due  Topic Date Due   COVID-19 Vaccine (1) Never done   Diabetic kidney evaluation - Urine ACR   01/07/2020   Medicare Annual Wellness (AWV)  01/13/2020   COLONOSCOPY (Pts 45-51yr Insurance coverage will need to be confirmed)  Never done   FOOT EXAM  12/26/2021   OPHTHALMOLOGY EXAM  03/05/2022     Objective: Lab Results  Component Value Date   HGBA1C 11.4 (A) 04/14/2022    Lab Results  Component Value Date   CREATININE 1.89 (H) 04/14/2022   BUN 16 04/14/2022   NA 137 04/14/2022   K 4.2 04/14/2022   CL 99 04/14/2022   CO2 23 04/14/2022    Lab Results  Component Value Date   CHOL 187 04/14/2022   HDL 41 04/14/2022   LDLCALC 101 (H) 04/14/2022   TRIG 263 (H) 04/14/2022   CHOLHDL 4.6 04/14/2022    Medications Reviewed Today     Reviewed by NFenton Foy NP (Nurse Practitioner) on 04/14/22 at 1034  Med List Status: <None>   Medication Order Taking? Sig Documenting Provider Last Dose Status Informant  amLODipine (NORVASC) 10 MG tablet [id number] Take 1 tablet (10 mg total) by mouth daily. NFenton Foy NP  Active   atorvastatin (LIPITOR) 10 MG tablet [id number] Take 1 tablet (10 mg total) by mouth daily. NFenton Foy NP  Active   blood glucose meter kit and supplies KIT [id number] Dispense based on patient and insurance preference. Use up to four times daily as directed. (FOR ICD-9 250.00, 250.01). Rai, RVernelle Emerald MD  Active Self  dicyclomine (BENTYL) 20 MG tablet [id number] Take 1 tablet (20 mg total) by mouth 2 (two) times daily. PAlfredia Client PA-C  Active   furosemide (LASIX) 20 MG tablet [id number] Take 1 tablet (20 mg total) by mouth daily as needed.  Patient taking differently: Take  20 mg by mouth daily.   Azzie Glatter, FNP  Active   glucose blood (ONETOUCH ULTRA) test strip [id number]  Use up to four times daily as directed. Vevelyn Francois, NP  Active   ibuprofen (ADVIL) 800 MG tablet [id number]  Take 1 tablet (800 mg total) by mouth every 8 (eight) hours as needed. Azzie Glatter, FNP  Active Self  insulin aspart (NOVOLOG FLEXPEN) 100  UNIT/ML FlexPen [id number]  Inject 8 Units into the skin 3 (three) times daily with meals. Fenton Foy, NP  Active   insulin detemir (LEVEMIR) 100 UNIT/ML FlexPen [id number]  Inject 20 Units into the skin at bedtime. Fenton Foy, NP  Active   Insulin Syringes, Disposable, U-100 0.5 ML MISC [id number]  Use with lantus and novolog vials. Mendel Corning, MD  Active Self  Lancets [place] [id number]  1 each by Does not apply route 3 (three) times daily as needed. Azzie Glatter, FNP  Active Self  metoCLOPramide (REGLAN) 10 MG tablet [id number]  Take 1 tablet (10 mg total) by mouth every 8 (eight) hours as needed for up to 5 days for nausea or vomiting. Alveria Apley, PA-C  Active Self           Med Note Renaee Munda Jul 23, 2020  9:32 AM)    ondansetron (ZOFRAN-ODT) 4 MG disintegrating tablet [id number]  Take 1 tablet (4 mg total) by mouth every 8 (eight) hours as needed for nausea or vomiting. Loni Beckwith, PA-C  Active   pantoprazole (PROTONIX) 20 MG tablet [id number]  Take 1 tablet (20 mg total) by mouth daily. Loni Beckwith, PA-C  Expired 02/01/22 2359   sucralfate (CARAFATE) 1 g tablet [id number]  Take 1 tablet (1 g total) by mouth 4 (four) times daily -  with meals and at bedtime for 14 days. Loni Beckwith, PA-C  Active   sulfamethoxazole-trimethoprim (BACTRIM DS) 800-160 MG tablet [id number]  Take 1 tablet by mouth 2 (two) times daily. Suzan Slick, NP  Active              Assessment/Plan:   Diabetes: - Currently uncontrolled - Reviewed long term cardiovascular and renal outcomes of uncontrolled blood sugar - Reviewed goal A1c, goal fasting, and goal 2 hour post prandial glucose - Reviewed dietary modifications including using sugar free soda.  - Recommend to continue Levemir 20 units daily and Novolog 8 units TID. Start Ozempic 0.25 mg weekly given concurrent CKD -Send CGM supplies.    Follow Up Plan:  Phone call in 3 weeks PCP visit on  07/14/2022  [name], Pharm.D. PGY-2 Ambulatory Care Pharmacy Resident 04/30/2022 3:16 PM

## 2022-05-05 MED ORDER — OZEMPIC (0.25 OR 0.5 MG/DOSE) 2 MG/3ML ~~LOC~~ SOPN: PEN_INJECTOR | SUBCUTANEOUS | 2 refills | Status: DC

## 2022-05-05 NOTE — Progress Notes (Signed)
Discussed with PCP, she was in agreement with starting Ozempic. Order placed for co-sign.   Catie Eppie Gibson, PharmD, [facility] [phone]

## 2022-05-09 ENCOUNTER — Telehealth: Payer: Self-pay

## 2022-05-09 NOTE — Progress Notes (Signed)
Attempted to contact patient to see if he was able to pick up Ozempic and CGM supplies that were newly prescribed at our last telephone visit on 04/30/2022.  Patient did not answer the phone and his voicemail was not set up so I was unable to leave a message. Follow up scheduled for 05/21/2022.   Valeda Malm, Pharm.D. PGY-2 Ambulatory Care Pharmacy Resident 05/09/2022 10:37 AM

## 2022-05-14 ENCOUNTER — Telehealth: Payer: Self-pay | Admitting: Family Medicine

## 2022-05-14 NOTE — Telephone Encounter (Signed)
Caller & Relationship to patient: patient   MRN #  [id number]   Call Back Number:   Date of Last Office Visit: 04/14/2022     Date of Next Office Visit: 07/14/2022    Medication(s) to be Refilled: amlodipine and atorvastatine   Preferred Pharmacy:   ** Please notify patient to allow 48-72 hours to process** **Let patient know to contact pharmacy at the end of the day to make sure medication is ready. ** **If patient has not been seen in a year or longer, book an appointment **Advise to use MyChart for refill requests OR to contact their pharmacy

## 2022-05-15 ENCOUNTER — Other Ambulatory Visit: Payer: Self-pay | Admitting: Nurse Practitioner

## 2022-05-15 ENCOUNTER — Other Ambulatory Visit: Payer: Self-pay

## 2022-05-15 DIAGNOSIS — I10 Essential (primary) hypertension: Secondary | ICD-10-CM

## 2022-05-15 MED ORDER — ATORVASTATIN CALCIUM 10 MG PO TABS: 10.0000 mg | ORAL_TABLET | Freq: Every day | ORAL | 0 refills | Status: DC

## 2022-05-15 MED ORDER — AMLODIPINE BESYLATE 10 MG PO TABS: 10.0000 mg | ORAL_TABLET | Freq: Every day | ORAL | 0 refills | Status: DC

## 2022-05-15 NOTE — Telephone Encounter (Signed)
Done Kh 

## 2022-05-21 ENCOUNTER — Other Ambulatory Visit: Payer: Medicare HMO | Admitting: Pharmacist

## 2022-05-21 MED ORDER — FREESTYLE LIBRE 3 SENSOR MISC: 3 refills | Status: AC

## 2022-05-21 NOTE — Chronic Care Management (AMB) (Signed)
05/21/2022 Name: [name] MRN: [id number] DOB: 16-Feb-1976  Chief Complaint  Patient presents with   Medication Management    Diabetes    [name] is a 46 y.o. year old male who presented for a telephone visit.   They were referred to the pharmacist by their PCP for assistance in managing diabetes.   Subjective:  Care Team: Primary Care Provider: Azzie Glatter, FNP ; Next Scheduled Visit: 07/14/2022  Medication Access/Adherence  Current Pharmacy:  [place] ([place]), [place] - [address] [place] ([place])  [address] Phone: [phone] Fax: [phone]  [facility] [place] [place] [facility] [address] [place] [address] Phone: [phone] Fax: [phone]   Patient reports affordability concerns with their medications: No  Patient reports access/transportation concerns to their pharmacy: No  Patient reports adherence concerns with their medications:  No     Diabetes:  Current medications: Levemir 10 units once day (prescribed as 20 units), Novolog 8 TID, Ozempic 0.25 mg weekly (Thursday's- has taken 2 doses thus far)    -Denies constipation or diarrhea since starting Ozempic. Of note, patient has h/o pancreatitis- denies any symptoms of pancreatitis including nausea/vomiting, belly pain, or abdomen swelling. He is aware this medication does increase the risk of pancreatitis and endorses understanding that he should immediately stop this medication and seek medical attention if s/sx of pancreatitis occur.   Current glucose readings: has not been checking, did not pick up CGM and does not have strips for OneTouch Ultra meter  Patient denies hypoglycemic s/sx including dizziness, shakiness, sweating. Patient denies hyperglycemic symptoms including polyuria, polydipsia, polyphagia, nocturia, neuropathy, blurred  vision.  Health Maintenance  Health Maintenance Due  Topic Date Due   COVID-19 Vaccine (1) Never done   Diabetic kidney evaluation - Urine ACR  Never done   Medicare Annual Wellness (AWV)  01/13/2020   COLONOSCOPY (Pts 45-43yr Insurance coverage will need to be confirmed)  Never done   FOOT EXAM  12/26/2021   OPHTHALMOLOGY EXAM  03/05/2022     Objective: Lab Results  Component Value Date   HGBA1C 11.4 (A) 04/14/2022    Lab Results  Component Value Date   CREATININE 1.89 (H) 04/14/2022   BUN 16 04/14/2022   NA 137 04/14/2022   K 4.2 04/14/2022   CL 99 04/14/2022   CO2 23 04/14/2022    Lab Results  Component Value Date   CHOL 187 04/14/2022   HDL 41 04/14/2022   LDLCALC 101 (H) 04/14/2022   TRIG 263 (H) 04/14/2022   CHOLHDL 4.6 04/14/2022    Medications Reviewed Today     Reviewed by NFenton Foy NP (Nurse Practitioner) on 04/14/22 at 1034  Med List Status: <None>   Medication Order Taking? Sig Documenting Provider Last Dose Status Informant  amLODipine (NORVASC) 10 MG tablet [id number] Take 1 tablet (10 mg total) by mouth daily. NFenton Foy NP  Active   atorvastatin (LIPITOR) 10 MG tablet [id number] Take 1 tablet (10 mg total) by mouth daily. NFenton Foy NP  Active   blood glucose meter kit and supplies KIT [id number] Dispense based on patient and insurance preference. Use up to four times daily as directed. (FOR ICD-9 250.00, 250.01). Rai, RVernelle Emerald MD  Active Self  dicyclomine (BENTYL) 20 MG tablet [id number] Take 1 tablet (20 mg total) by mouth 2 (two) times daily.  Alfredia Client, PA-C  Active   furosemide (LASIX) 20 MG tablet [id number]  Take 1 tablet (20 mg total) by mouth daily as needed.  Patient taking differently: Take 20 mg by mouth daily.   Azzie Glatter, FNP  Active   glucose blood (ONETOUCH ULTRA) test strip [id number]  Use up to four times daily as directed. Vevelyn Francois, NP  Active   ibuprofen (ADVIL) 800 MG tablet [id number]   Take 1 tablet (800 mg total) by mouth every 8 (eight) hours as needed. Azzie Glatter, FNP  Active Self  insulin aspart (NOVOLOG FLEXPEN) 100 UNIT/ML FlexPen [id number]  Inject 8 Units into the skin 3 (three) times daily with meals. Fenton Foy, NP  Active   insulin detemir (LEVEMIR) 100 UNIT/ML FlexPen [id number]  Inject 20 Units into the skin at bedtime. Fenton Foy, NP  Active   Insulin Syringes, Disposable, U-100 0.5 ML MISC [id number]  Use with lantus and novolog vials. Mendel Corning, MD  Active Self  Lancets [place] [id number]  1 each by Does not apply route 3 (three) times daily as needed. Azzie Glatter, FNP  Active Self  metoCLOPramide (REGLAN) 10 MG tablet [id number]  Take 1 tablet (10 mg total) by mouth every 8 (eight) hours as needed for up to 5 days for nausea or vomiting. Alveria Apley, PA-C  Active Self           Med Note Renaee Munda Jul 23, 2020  9:32 AM)    ondansetron (ZOFRAN-ODT) 4 MG disintegrating tablet [id number]  Take 1 tablet (4 mg total) by mouth every 8 (eight) hours as needed for nausea or vomiting. Loni Beckwith, PA-C  Active   pantoprazole (PROTONIX) 20 MG tablet [id number]  Take 1 tablet (20 mg total) by mouth daily. Loni Beckwith, PA-C  Expired 02/01/22 2359   sucralfate (CARAFATE) 1 g tablet [id number]  Take 1 tablet (1 g total) by mouth 4 (four) times daily -  with meals and at bedtime for 14 days. Loni Beckwith, PA-C  Active   sulfamethoxazole-trimethoprim (BACTRIM DS) 800-160 MG tablet [id number]  Take 1 tablet by mouth 2 (two) times daily. Suzan Slick, NP  Active             Assessment/Plan:   Diabetes: - Currently uncontrolled based on A1c. Patient has tolerated Ozempic 0.25 mg weekly without GI side effects. He was not able to pick up his CGM from [facility] as his insurance requires him to fill via mail order.  - Recommended to continue Ozempic 0.25 mg weekly x4 weeks, then increase to 0.5 mg weekly thereafter.   Discussed with PCP, and she is in agreement. - Patient is aware this medication does increase the risk of pancreatitis and endorses understanding that he should stop this medication and immediately seek medical attention if s/sx of pancreatitis occur. - Free[facility] Libre [facility] Provided number and instructed patient he'll need to inquire about cost.  - Reviewed long term cardiovascular and renal outcomes of uncontrolled blood sugar. - Reviewed goal A1c, goal fasting, and goal 2 hour post prandial glucose. - Recommend to use CGM to check glucose continuously. Instructed patient that if his CGM is not an affordable price, please reach out and we will send a refill for his OneTouch Ultra test strips.   Follow Up Plan:  Pharmacist 06/18/2022 PCP 07/14/2022  [name], Pharm.D. PGY-2 Ambulatory Care Pharmacy Resident 05/21/2022 3:41  PM

## 2022-05-21 NOTE — Progress Notes (Signed)
Reviewed resident documentation. Agree with counseling on signs/symptoms of pancreatitis and to discontinue therapy if any abdominal pain.   Catie Eppie Gibson, PharmD, BCACP, CPP [facility] [phone]

## 2022-06-03 ENCOUNTER — Telehealth: Payer: Self-pay | Admitting: Nurse Practitioner

## 2022-06-03 NOTE — Telephone Encounter (Signed)
Adding [facility] Refill pool to encounter

## 2022-06-03 NOTE — Telephone Encounter (Signed)
Caller & Relationship to patient:  MRN #  [id number]   Call Back Number: [phone]  Date of Last Office Visit: 05/15/2022     Date of Next Office Visit: 07/14/2022    Medication(s) to be Refilled: Atorvastatin and Amlodipine  Preferred Pharmacy:   ** Please notify patient to allow 48-72 hours to process** **Let patient know to contact pharmacy at the end of the day to make sure medication is ready. ** **If patient has not been seen in a year or longer, book an appointment **Advise to use MyChart for refill requests OR to contact their pharmacy

## 2022-06-16 ENCOUNTER — Telehealth: Payer: Self-pay | Admitting: Nurse Practitioner

## 2022-06-16 ENCOUNTER — Other Ambulatory Visit: Payer: Self-pay

## 2022-06-16 DIAGNOSIS — I10 Essential (primary) hypertension: Secondary | ICD-10-CM

## 2022-06-16 MED ORDER — ATORVASTATIN CALCIUM 10 MG PO TABS: 10.0000 mg | ORAL_TABLET | Freq: Every day | ORAL | 0 refills | Status: DC

## 2022-06-16 MED ORDER — AMLODIPINE BESYLATE 10 MG PO TABS: 10.0000 mg | ORAL_TABLET | Freq: Every day | ORAL | 0 refills | Status: DC

## 2022-06-16 NOTE — Telephone Encounter (Signed)
Caller & Relationship to patient:  MRN #  [id number]   Call Back Number:   Date of Last Office Visit: 06/03/2022     Date of Next Office Visit: 07/21/2022    Medication(s) to be Refilled: Amlodipine, Atorvastatin   Preferred Pharmacy:   ** Please notify patient to allow 48-72 hours to process** **Let patient know to contact pharmacy at the end of the day to make sure medication is ready. ** **If patient has not been seen in a year or longer, book an appointment **Advise to use MyChart for refill requests OR to contact their pharmacy

## 2022-06-16 NOTE — Telephone Encounter (Signed)
Done KH 

## 2022-06-18 ENCOUNTER — Other Ambulatory Visit: Payer: Medicare HMO | Admitting: Pharmacist

## 2022-06-18 NOTE — Progress Notes (Signed)
Attempted to contact patient for our telephone visit today. However, patient stated he was not able to talk today.   I was able to confirm patient has continued to tolerate Ozempic without issue. Denies GI side effects and denies symptoms of pancreatitis including but not limited to nausea/vomiting, belly pain, or abdomen swelling.   He states he was not able [facility], but is unsure why.   [facility] and they said member has to call DME supplier [place], [phone]) and give them the provider order code [phone].   Called back patient to Patient expressed understanding and stated he will call today. I also requested patient signup for MyChart for ease of communication.   Will try to follow up with patient for a full visit on 06/30/2022.  [name], Pharm.D. PGY-2 Ambulatory Care Pharmacy Resident 06/18/2022 2:10 PM

## 2022-06-30 ENCOUNTER — Other Ambulatory Visit: Payer: Medicare HMO | Admitting: Pharmacist

## 2022-06-30 MED ORDER — OZEMPIC (0.25 OR 0.5 MG/DOSE) 2 MG/3ML ~~LOC~~ SOPN: 0.5000 mg | PEN_INJECTOR | SUBCUTANEOUS | 2 refills | Status: DC

## 2022-06-30 NOTE — Progress Notes (Signed)
06/30/2022 Name: [name] MRN: [id number] DOB: Aug 16, 1975  Chief Complaint  Patient presents with   Medication Management   Diabetes    [name] is a 47 y.o. year old male who presented for a telephone visit.   They were referred to the pharmacist by their PCP for assistance in managing diabetes.  /  Subjective:  Care Team: Primary Care Provider: Fenton Foy, NP ; Next Scheduled Visit: 07/21/22   Medication Access/Adherence  Current Pharmacy:  [facility] ([place]), [place] - [address] [place] ([place] [address] Phone: [phone] Fax: [phone]  [facility] [place] [place] [facility] [address] [employer] [address] [place] [address] Phone: [phone] Fax: [phone]   Patient reports affordability concerns with their medications: No  Patient reports access/transportation concerns to their pharmacy: No  Patient reports adherence concerns with their medications:  No     Diabetes:  Current medications: Ozempic 0.25 mg weekly, Levemir 10 units daily, Novolog 8 units with meals.   Medications tried in the past: metformin (renal function borderline for initiation)  Denies any new stomach upset, nausea, constipation since starting Ozempic.   Current glucose readings: unknown; reports he talked to someone from his insurance the other day and his CGM order should be in the mail, should arrive in the next few days   Patient denies hypoglycemic s/sx including dizziness, shakiness, sweating. Patient reports hyperglycemic symptoms including polyuria, polydipsia, nocturia,     Objective:  Lab Results  Component Value Date   HGBA1C 11.4 (A) 04/14/2022    Lab Results  Component Value Date   CREATININE 1.89 (H) 04/14/2022   BUN 16 04/14/2022   NA 137 04/14/2022   K 4.2 04/14/2022   CL 99 04/14/2022   CO2 23 04/14/2022     Lab Results  Component Value Date   CHOL 187 04/14/2022   HDL 41 04/14/2022   LDLCALC 101 (H) 04/14/2022   TRIG 263 (H) 04/14/2022   CHOLHDL 4.6 04/14/2022    Medications Reviewed Today     Reviewed by Osker Mason, RPH-CPP (Pharmacist) on 06/30/22 at 57  Med List Status: <None>   Medication Order Taking? Sig Documenting Provider Last Dose Status Informant  amLODipine (NORVASC) 10 MG tablet [id number] Yes Take 1 tablet (10 mg total) by mouth daily. Fenton Foy, NP Taking Active   atorvastatin (LIPITOR) 10 MG tablet [id number] Yes Take 1 tablet (10 mg total) by mouth daily. Fenton Foy, NP Taking Active   blood glucose meter kit and supplies KIT [id number]  Dispense based on patient and insurance preference. Use up to four times daily as directed. (FOR ICD-9 250.00, 250.01). Rai, Vernelle Emerald, MD  Active Self  Continuous Blood Gluc Sensor (FREESTYLE LIBRE 3 SENSOR) [place] [id number] Yes Place 1 sensor on the skin every 14 days. Use to check glucose continuously. Fenton Foy, NP Taking Active   dicyclomine (BENTYL) 20 MG tablet [id number] Yes Take 1 tablet (20 mg total) by mouth 2 (two) times daily. Alfredia Client, PA-C Taking Active   furosemide (LASIX) 20 MG tablet [id number] Yes Take 1 tablet (20 mg total) by mouth daily as needed.  Patient taking differently: Take 20 mg by mouth daily.   Azzie Glatter, FNP Taking Active   glucose blood (ONETOUCH ULTRA) test strip [id number]  Use up to four times daily as directed. Vevelyn Francois, NP  Active  ibuprofen (ADVIL) 800 MG tablet [id number] Yes Take 1 tablet (800 mg total) by mouth every 8 (eight) hours as needed. Azzie Glatter, FNP Taking Active Self  insulin aspart (NOVOLOG FLEXPEN) 100 UNIT/ML FlexPen [id number] Yes Inject 8 Units into the skin 3 (three) times daily with meals. Fenton Foy, NP Taking Active   insulin detemir (LEVEMIR) 100 UNIT/ML FlexPen [id number] Yes Inject 20 Units into the skin at bedtime.  Fenton Foy, NP Taking Active   Insulin Syringes, Disposable, U-100 0.5 ML MISC [id number] No Use with lantus and novolog vials.  Patient not taking: Reported on 06/30/2022   Mendel Corning, MD Not Taking Active Self  Lancets [place] [id number]  1 each by Does not apply route 3 (three) times daily as needed. Azzie Glatter, FNP  Active Self  ondansetron (ZOFRAN-ODT) 4 MG disintegrating tablet [id number]  Take 1 tablet (4 mg total) by mouth every 8 (eight) hours as needed for nausea or vomiting. Loni Beckwith, PA-C  Active   pantoprazole (PROTONIX) 20 MG tablet [id number] Yes Take 1 tablet (20 mg total) by mouth daily. Loni Beckwith, PA-C Taking Active   Semaglutide,0.25 or 0.5MG /DOS, (OZEMPIC, 0.25 OR 0.5 MG/DOSE,) 2 MG/3ML SOPN [id number] Yes Inject 0.25 mg weekly for 4 weeks, then increase to 0.5 mg weekly Fenton Foy, NP Taking Active               Assessment/Plan:   Diabetes: - Currently uncontrolled - Reviewed long term cardiovascular and renal outcomes of uncontrolled blood sugar - Reviewed goal A1c, goal fasting, and goal 2 hour post prandial glucose - Recommend to increase Ozempic to 0.5 mg as previously prescribed. Recommend to reduce Levemir to 8 units daily and reduce Novolog to 6 units daily. Patient to contact me when CGM arrives.  - Counseled on signs and symptoms of hypoglycemia and to contact me with development of any   Follow Up Plan: phone call in 6 weeks  Catie Hedwig Morton, PharmD, [place], [facility] [phone]

## 2022-07-14 ENCOUNTER — Ambulatory Visit: Payer: Self-pay | Admitting: Nurse Practitioner

## 2022-07-21 ENCOUNTER — Ambulatory Visit: Payer: Self-pay | Admitting: Nurse Practitioner

## 2022-07-28 ENCOUNTER — Encounter: Payer: Self-pay | Admitting: Nurse Practitioner

## 2022-07-28 ENCOUNTER — Ambulatory Visit (INDEPENDENT_AMBULATORY_CARE_PROVIDER_SITE_OTHER): Payer: Medicare HMO | Admitting: Nurse Practitioner

## 2022-07-28 VITALS — BP 133/83 | HR 80 | Temp 97.3°F | Ht 73.0 in | Wt 233.6 lb

## 2022-07-28 DIAGNOSIS — E119 Type 2 diabetes mellitus without complications: Secondary | ICD-10-CM

## 2022-07-28 DIAGNOSIS — Z794 Long term (current) use of insulin: Secondary | ICD-10-CM | POA: Diagnosis not present

## 2022-07-28 DIAGNOSIS — I10 Essential (primary) hypertension: Secondary | ICD-10-CM

## 2022-07-28 LAB — POCT GLYCOSYLATED HEMOGLOBIN (HGB A1C): Hemoglobin A1C: 8.9 % — AB (ref 4.0–5.6)

## 2022-07-28 MED ORDER — AMLODIPINE BESYLATE 10 MG PO TABS: 10.0000 mg | ORAL_TABLET | Freq: Every day | ORAL | 0 refills | Status: DC

## 2022-07-28 MED ORDER — ATORVASTATIN CALCIUM 10 MG PO TABS: 10.0000 mg | ORAL_TABLET | Freq: Every day | ORAL | 0 refills | Status: DC

## 2022-07-28 NOTE — Assessment & Plan Note (Signed)
-   Ambulatory referral to Ophthalmology - amLODipine (NORVASC) 10 MG tablet; Take 1 tablet (10 mg total) by mouth daily.  Dispense: 90 tablet; Refill: 0 - atorvastatin (LIPITOR) 10 MG tablet; Take 1 tablet (10 mg total) by mouth daily.  Dispense: 90 tablet; Refill: 0 - CBC - Comprehensive metabolic panel  2. Controlled type 2 diabetes mellitus without complication, with long-term current use of insulin ([name])  - Ambulatory referral to Ophthalmology - Ambulatory referral to [place] / creatinine urine ratio - POCT glycosylated hemoglobin (Hb A1C) - CBC - Comprehensive metabolic panel  Follow up:  Follow up in 3 months
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
## 2022-07-28 NOTE — Patient Instructions (Signed)
1. Hypertension, unspecified type  - Ambulatory referral to Ophthalmology - amLODipine (NORVASC) 10 MG tablet; Take 1 tablet (10 mg total) by mouth daily.  Dispense: 90 tablet; Refill: 0 - atorvastatin (LIPITOR) 10 MG tablet; Take 1 tablet (10 mg total) by mouth daily.  Dispense: 90 tablet; Refill: 0 - CBC - Comprehensive metabolic panel  2. Controlled type 2 diabetes mellitus without complication, with long-term current use of insulin ([place])  - Ambulatory referral to Ophthalmology - Ambulatory referral to [place] / creatinine urine ratio - POCT glycosylated hemoglobin (Hb A1C) - CBC - Comprehensive metabolic panel  Follow up:  Follow up in 3 months

## 2022-07-29 LAB — CBC
Hematocrit: 38.4 % (ref 37.5–51.0)
Hemoglobin: 13.3 g/dL (ref 13.0–17.7)
MCH: 31.1 pg (ref 26.6–33.0)
MCHC: 34.6 g/dL (ref 31.5–35.7)
MCV: 90 fL (ref 79–97)
Platelets: 218 10*3/uL (ref 150–450)
RBC: 4.27 x10E6/uL (ref 4.14–5.80)
RDW: 13.1 % (ref 11.6–15.4)
WBC: 7.7 10*3/uL (ref 3.4–10.8)

## 2022-07-29 LAB — COMPREHENSIVE METABOLIC PANEL
ALT: 21 IU/L (ref 0–44)
AST: 20 IU/L (ref 0–40)
Albumin/Globulin Ratio: 1.4 (ref 1.2–2.2)
Albumin: 4.2 g/dL (ref 4.1–5.1)
Alkaline Phosphatase: 121 IU/L (ref 44–121)
BUN/Creatinine Ratio: 8 — ABNORMAL LOW (ref 9–20)
BUN: 15 mg/dL (ref 6–24)
Bilirubin Total: 0.3 mg/dL (ref 0.0–1.2)
CO2: 24 mmol/L (ref 20–29)
Calcium: 9.5 mg/dL (ref 8.7–10.2)
Chloride: 102 mmol/L (ref 96–106)
Creatinine, Ser: 1.77 mg/dL — ABNORMAL HIGH (ref 0.76–1.27)
Globulin, Total: 3.1 g/dL (ref 1.5–4.5)
Glucose: 205 mg/dL — ABNORMAL HIGH (ref 70–99)
Potassium: 4.1 mmol/L (ref 3.5–5.2)
Sodium: 140 mmol/L (ref 134–144)
Total Protein: 7.3 g/dL (ref 6.0–8.5)
eGFR: 48 mL/min/{1.73_m2} — ABNORMAL LOW (ref >59)

## 2022-07-29 LAB — MICROALBUMIN / CREATININE URINE RATIO
Creatinine, Urine: 206.5 mg/dL
Microalb/Creat Ratio: 324 mg/g creat — ABNORMAL HIGH (ref 0–29)
Microalbumin, Urine: 669.6 ug/mL

## 2022-07-30 ENCOUNTER — Other Ambulatory Visit: Payer: Self-pay | Admitting: Nurse Practitioner

## 2022-07-30 DIAGNOSIS — N289 Disorder of kidney and ureter, unspecified: Secondary | ICD-10-CM

## 2022-08-07 ENCOUNTER — Telehealth: Payer: Self-pay | Admitting: Nurse Practitioner

## 2022-08-07 NOTE — Telephone Encounter (Signed)
Contacted [name] to schedule their annual wellness visit. Appointment made for 08/14/2022.  Thank you,  [facility] Direct dial  [phone]

## 2022-08-12 NOTE — Progress Notes (Signed)
Lab results mail it to pt home address.

## 2022-08-13 ENCOUNTER — Telehealth: Payer: Self-pay

## 2022-08-13 ENCOUNTER — Telehealth: Payer: Self-pay | Admitting: Nurse Practitioner

## 2022-08-13 ENCOUNTER — Other Ambulatory Visit: Payer: Medicare HMO

## 2022-08-13 NOTE — Telephone Encounter (Signed)
I attempted to call patient to confirm phone call for AWV on 08/14/2022 at 10:15, but no voice mail.

## 2022-08-13 NOTE — Progress Notes (Signed)
Attempted to contact patient for scheduled appointment for medication management. However, patient's voicemail was not set up and I was unable to leave a message.   [name], Pharm.D. PGY-2 Ambulatory Care Pharmacy Resident

## 2022-08-14 ENCOUNTER — Telehealth: Payer: Self-pay | Admitting: Nurse Practitioner

## 2022-08-14 ENCOUNTER — Ambulatory Visit: Payer: Medicare HMO

## 2022-08-14 NOTE — Telephone Encounter (Signed)
I attempted to contact patient to reschedule AWV with Rise Paganini, [facility].  Patient had appointment for AWV scheduled this morning, but when [facility] called patient he was driving. Patient doesn't have voice mail.  If patient returns my call, please reschedule appointment with [facility] or you can transfer the call to me at [phone]. Thank you,  [facility] Direct [facility]  [phone]

## 2022-09-01 ENCOUNTER — Other Ambulatory Visit: Payer: Self-pay | Admitting: Pharmacist

## 2022-09-01 DIAGNOSIS — E119 Type 2 diabetes mellitus without complications: Secondary | ICD-10-CM

## 2022-09-01 MED ORDER — SEMAGLUTIDE (1 MG/DOSE) 4 MG/3ML ~~LOC~~ SOPN: 1.0000 mg | PEN_INJECTOR | SUBCUTANEOUS | 1 refills | Status: DC

## 2022-09-01 MED ORDER — NOVOLOG FLEXPEN 100 UNIT/ML ~~LOC~~ SOPN: 6.0000 [IU] | PEN_INJECTOR | Freq: Three times a day (TID) | SUBCUTANEOUS | 3 refills | Status: AC

## 2022-09-01 MED ORDER — INSULIN DETEMIR 100 UNIT/ML FLEXPEN: 8.0000 [IU] | PEN_INJECTOR | Freq: Every day | SUBCUTANEOUS | 11 refills | Status: AC

## 2022-09-01 NOTE — Progress Notes (Signed)
09/01/2022 Name: [name] MRN: [id number] DOB: August 10, 1975  Chief Complaint  Patient presents with   Medication Management   Diabetes    Ana Beldin is a 47 y.o. year old male who presented for a telephone visit.   They were referred to the pharmacist by their PCP for assistance in managing diabetes.    Subjective:  Care Team: Primary Care Provider: Fenton Foy, NP ; Next Scheduled Visit: 10/2022  Medication Access/Adherence  Current Pharmacy:  [place] ([place]), [place] - [address] [place]) [place] [address] Phone: [phone] Fax: [phone]  [facility] [place], [place] [facility] [address] [place] [address] Phone: [phone] Fax: [phone]   Patient reports affordability concerns with their medications: No  Patient reports access/transportation concerns to their pharmacy: No  Patient reports adherence concerns with their medications:  No     Diabetes:  Current medications: Ozempic 0.5 mg weekly, Levemir 10 units daily, Novolog 8 units with meals Medications tried in the past: renal function borderline for metformin initiation;   Current glucose readings: fastings 110-120s; 2 hour post prandial 240s  Reports he has the Libre CGM but has not gotten around to setting up yet.   Patient denies hypoglycemic s/sx including dizziness, shakiness, sweating. Patient denies hyperglycemic symptoms including polyuria, polydipsia, polyphagia, nocturia, neuropathy, blurred vision.   Objective:  Lab Results  Component Value Date   HGBA1C 8.9 (A) 07/28/2022    Lab Results  Component Value Date   CREATININE 1.77 (H) 07/28/2022   BUN 15 07/28/2022   NA 140 07/28/2022   K 4.1 07/28/2022   CL 102 07/28/2022   CO2 24 07/28/2022    Lab Results  Component Value Date   CHOL 187 04/14/2022   HDL 41  04/14/2022   LDLCALC 101 (H) 04/14/2022   TRIG 263 (H) 04/14/2022   CHOLHDL 4.6 04/14/2022    Medications Reviewed Today     Reviewed by Fenton Foy, NP (Nurse Practitioner) on 07/28/22 at 1352  Med List Status: <None>   Medication Order Taking? Sig Documenting Provider Last Dose Status Informant  amLODipine (NORVASC) 10 MG tablet [id number]  Take 1 tablet (10 mg total) by mouth daily. Fenton Foy, NP  Active   atorvastatin (LIPITOR) 10 MG tablet [id number]  Take 1 tablet (10 mg total) by mouth daily. Fenton Foy, NP  Active   blood glucose meter kit and supplies KIT [id number]  Dispense based on patient and insurance preference. Use up to four times daily as directed. (FOR ICD-9 250.00, 250.01). Rai, Vernelle Emerald, MD  Active Self  Continuous Blood Gluc Sensor (FREESTYLE LIBRE 3 SENSOR) [place] [id number] Yes Place 1 sensor on the skin every 14 days. Use to check glucose continuously. Fenton Foy, NP Taking Active   dicyclomine (BENTYL) 20 MG tablet [id number] Yes Take 1 tablet (20 mg total) by mouth 2 (two) times daily. Alfredia Client, PA-C Taking Active   furosemide (LASIX) 20 MG tablet [id number] Yes Take 1 tablet (20 mg total) by mouth daily as needed.  Patient taking differently: Take 20 mg by mouth daily.   Azzie Glatter, FNP Taking Active   glucose blood (ONETOUCH ULTRA) test strip [id number] Yes Use up to four times daily as directed. Vevelyn Francois, NP Taking Active   ibuprofen (ADVIL) 800 MG tablet [id number] Yes Take 1 tablet (800 mg total) by  mouth every 8 (eight) hours as needed. Azzie Glatter, FNP Taking Active Self           Med Note Elyse Jarvis   Mon Jul 28, 2022 11:04 AM) prn  insulin aspart (NOVOLOG FLEXPEN) 100 UNIT/ML FlexPen [id number] Yes Inject 8 Units into the skin 3 (three) times daily with meals. Fenton Foy, NP Taking Active   insulin detemir (LEVEMIR) 100 UNIT/ML FlexPen [id number] Yes Inject 20 Units into the skin at bedtime. Fenton Foy, NP Taking Active   Insulin Syringes, Disposable, U-100 0.5 ML MISC [id number] No Use with lantus and novolog vials.  Patient not taking: Reported on 06/30/2022   Mendel Corning, MD Not Taking Active Self  Lancets [place] [id number] Yes 1 each by Does not apply route 3 (three) times daily as needed. Azzie Glatter, FNP Taking Active Self  ondansetron (ZOFRAN-ODT) 4 MG disintegrating tablet [id number] Yes Take 1 tablet (4 mg total) by mouth every 8 (eight) hours as needed for nausea or vomiting. Loni Beckwith, PA-C Taking Active            Med Note Elyse Jarvis   Mon Jul 28, 2022 11:05 AM) prn  pantoprazole (PROTONIX) 20 MG tablet [id number] Yes Take 1 tablet (20 mg total) by mouth daily. Loni Beckwith, PA-C Taking Active            Med Note Elyse Jarvis   Mon Jul 28, 2022 11:05 AM) Prn   Semaglutide,0.25 or 0.5MG /DOS, (OZEMPIC, 0.25 OR 0.5 MG/DOSE,) 2 MG/3ML SOPN [id number] Yes Inject 0.5 mg into the skin once a week. Fenton Foy, NP Taking Active               Assessment/Plan:   Diabetes: - Currently uncontrolled but improved - Given upcoming Levemir discontinuation, adjustments needed. Discussed with patient. Increase Ozempic to 1 mg weekly, reduce Levemir to 8 units QPM and Novolog to 6 units with meals. Discontinue Levemir if any development of morning hypoglycemia.  - Moving forward, will discuss SGLT2.  Follow Up Plan: phone call in 4 weeks  Catie TJodi Mourning, PharmD, [place], [facility] [phone]

## 2022-09-15 ENCOUNTER — Telehealth: Payer: Self-pay | Admitting: Nurse Practitioner

## 2022-09-15 NOTE — Telephone Encounter (Signed)
Called patient to schedule Medicare Annual Wellness Visit (AWV). No voicemail available to leave a message.  Last date of AWV: AWVI eligible as of 08/08/2015  Please schedule an AWVI appointment at any time with [facility] VISIT.  If any questions, please contact me at [phone].    Thank you,  [facility] Support [facility] Direct dial  [phone]

## 2022-09-25 ENCOUNTER — Encounter: Payer: Self-pay | Admitting: Orthopedic Surgery

## 2022-09-25 ENCOUNTER — Ambulatory Visit (INDEPENDENT_AMBULATORY_CARE_PROVIDER_SITE_OTHER): Payer: Medicare HMO | Admitting: Orthopedic Surgery

## 2022-09-25 DIAGNOSIS — Z89511 Acquired absence of right leg below knee: Secondary | ICD-10-CM | POA: Diagnosis not present
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
## 2022-09-29 ENCOUNTER — Other Ambulatory Visit: Payer: Medicare HMO | Admitting: Pharmacist

## 2022-09-29 ENCOUNTER — Telehealth: Payer: Self-pay | Admitting: Pharmacist

## 2022-09-29 NOTE — Progress Notes (Unsigned)
Attempted to contact patient for scheduled appointment for medication management. Left HIPAA compliant message for patient to return my call at their convenience.    Catie T. Francene Mcerlean, PharmD, BCACP, CPP [facility] [phone]  

## 2022-10-01 ENCOUNTER — Telehealth: Payer: Self-pay

## 2022-10-01 NOTE — Progress Notes (Signed)
   Care Guide Note  10/01/2022 Name: Troy Kanouse MRN: [id number] DOB: Apr 04, 1976  Referred by: Ivonne Andrew, NP Reason for referral : Care Coordination (Outreach to reschedule f/u with Pharm d )   Rashawd Laskaris is a 47 y.o. year old male who is a primary care patient of Ivonne Andrew, NP. Elvyn Krohn was referred to the pharmacist for assistance related to DM.    An unsuccessful telephone outreach was attempted today to contact the patient who was referred to the pharmacy team for assistance with medication management. Additional attempts will be made to contact the patient.   Penne Lash, RMA Care Guide [facility]  [place], [place] [address] Direct Dial: [phone] [email or web address]

## 2022-10-09 NOTE — Progress Notes (Signed)
   Care Guide Note  10/09/2022 Name: [name] MRN: [id number] DOB: 1975/10/16  Referred by: Ivonne Andrew, NP Reason for referral : Care Coordination (Outreach to reschedule f/u with Pharm d )   [name] is a 47 y.o. year old male who is a primary care patient of Ivonne Andrew, NP. [name] was referred to the pharmacist for assistance related to DM.    A second unsuccessful telephone outreach was attempted today to contact the patient who was referred to the pharmacy team for assistance with medication management. Additional attempts will be made to contact the patient.  Penne Lash, RMA Care Guide [facility]  [place], [place] [address] Direct Dial: [phone] [email or web address]

## 2022-10-14 NOTE — Progress Notes (Signed)
   Care Guide Note  10/14/2022 Name: Linford Sweet MRN: [id number] DOB: 1975-11-19  Referred by: Ivonne Andrew, NP Reason for referral : Care Coordination (Outreach to reschedule f/u with Pharm d )   Kaiyan Laing is a 47 y.o. year old male who is a primary care patient of Ivonne Andrew, NP. Kendale Plucinski was referred to the pharmacist for assistance related to DM.    A third unsuccessful telephone outreach was attempted today to contact the patient who was referred to the pharmacy team for assistance with medication management. The Population Health team is pleased to engage with this patient at any time in the future upon receipt of referral and should he/she be interested in assistance from the [facility] team.   Penne Lash, RMA Care Guide [facility]  [place], [place] [address] Direct Dial: [phone] [email or web address]

## 2022-10-17 ENCOUNTER — Other Ambulatory Visit: Payer: Self-pay

## 2022-10-17 ENCOUNTER — Emergency Department (HOSPITAL_COMMUNITY): Payer: Medicare HMO

## 2022-10-17 ENCOUNTER — Encounter (HOSPITAL_COMMUNITY): Payer: Self-pay | Admitting: Emergency Medicine

## 2022-10-17 ENCOUNTER — Emergency Department (HOSPITAL_COMMUNITY)
Admission: EM | Admit: 2022-10-17 | Discharge: 2022-10-17 | Disposition: A | Discharge status: Home / Self Care | Payer: Medicare HMO | Attending: Emergency Medicine | Admitting: Emergency Medicine

## 2022-10-17 DIAGNOSIS — N289 Disorder of kidney and ureter, unspecified: Secondary | ICD-10-CM | POA: Diagnosis not present

## 2022-10-17 DIAGNOSIS — R7989 Other specified abnormal findings of blood chemistry: Secondary | ICD-10-CM | POA: Insufficient documentation

## 2022-10-17 DIAGNOSIS — I10 Essential (primary) hypertension: Secondary | ICD-10-CM | POA: Diagnosis not present

## 2022-10-17 DIAGNOSIS — Z794 Long term (current) use of insulin: Secondary | ICD-10-CM | POA: Insufficient documentation

## 2022-10-17 DIAGNOSIS — R1031 Right lower quadrant pain: Secondary | ICD-10-CM | POA: Diagnosis not present

## 2022-10-17 DIAGNOSIS — N3289 Other specified disorders of bladder: Secondary | ICD-10-CM | POA: Diagnosis not present

## 2022-10-17 LAB — URINALYSIS, ROUTINE W REFLEX MICROSCOPIC
Bilirubin Urine: NEGATIVE
Glucose, UA: NEGATIVE mg/dL
Hgb urine dipstick: NEGATIVE
Ketones, ur: NEGATIVE mg/dL
Leukocytes,Ua: NEGATIVE
Nitrite: NEGATIVE
Protein, ur: 100 mg/dL — AB
Specific Gravity, Urine: 1.016 (ref 1.005–1.030)
pH: 5 (ref 5.0–8.0)

## 2022-10-17 LAB — LIPASE, BLOOD: Lipase: 36 U/L (ref 11–51)

## 2022-10-17 LAB — CBC
HCT: 38.5 % — ABNORMAL LOW (ref 39.0–52.0)
Hemoglobin: 13.3 g/dL (ref 13.0–17.0)
MCH: 31.2 pg (ref 26.0–34.0)
MCHC: 34.5 g/dL (ref 30.0–36.0)
MCV: 90.4 fL (ref 80.0–100.0)
Platelets: 220 10*3/uL (ref 150–400)
RBC: 4.26 MIL/uL (ref 4.22–5.81)
RDW: 13.2 % (ref 11.5–15.5)
WBC: 6.7 10*3/uL (ref 4.0–10.5)
nRBC: 0 % (ref 0.0–0.2)

## 2022-10-17 LAB — COMPREHENSIVE METABOLIC PANEL
ALT: 17 U/L (ref 0–44)
AST: 19 U/L (ref 15–41)
Albumin: 3.9 g/dL (ref 3.5–5.0)
Alkaline Phosphatase: 90 U/L (ref 38–126)
Anion gap: 9 (ref 5–15)
BUN: 16 mg/dL (ref 6–20)
CO2: 27 mmol/L (ref 22–32)
Calcium: 8.9 mg/dL (ref 8.9–10.3)
Chloride: 98 mmol/L (ref 98–111)
Creatinine, Ser: 2.03 mg/dL — ABNORMAL HIGH (ref 0.61–1.24)
GFR, Estimated: 40 mL/min — ABNORMAL LOW (ref >60)
Glucose, Bld: 128 mg/dL — ABNORMAL HIGH (ref 70–99)
Potassium: 3.5 mmol/L (ref 3.5–5.1)
Sodium: 134 mmol/L — ABNORMAL LOW (ref 135–145)
Total Bilirubin: 0.9 mg/dL (ref 0.3–1.2)
Total Protein: 7.5 g/dL (ref 6.5–8.1)

## 2022-10-17 MED ORDER — SODIUM CHLORIDE 0.9 % IV BOLUS
500.0000 mL | Freq: Once | INTRAVENOUS | Status: AC
  Administered 2022-10-17: 500 mL via INTRAVENOUS

## 2022-10-17 MED ORDER — DROPERIDOL 2.5 MG/ML IJ SOLN
1.2500 mg | Freq: Once | INTRAMUSCULAR | Status: AC
  Administered 2022-10-17: 1.25 mg via INTRAVENOUS
  Filled 2022-10-17: qty 2

## 2022-10-17 NOTE — ED Provider Notes (Signed)
[address] EMERGENCY DEPARTMENT AT [facility] Provider Note   CSN: [id number] Arrival date & time: 10/17/22  0139     History  Chief Complaint  Patient presents with   Abdominal Pain    [name] is a 47 y.o. male.  The history is provided by the patient.  Abdominal Pain Pain location:  RLQ Pain quality: aching   Pain radiates to:  Does not radiate Pain severity:  Severe Onset quality:  Gradual Duration:  3 days Timing:  Constant Progression:  Unchanged Chronicity:  Recurrent Context: retching   Associated symptoms: nausea and vomiting   Associated symptoms: no diarrhea, no dysuria and no fever   Has had this pain before      Home Medications Prior to Admission medications   Medication Sig Start Date End Date Taking? Authorizing Provider  amLODipine (NORVASC) 10 MG tablet Take 1 tablet (10 mg total) by mouth daily. 07/28/22   Ivonne Andrew, NP  atorvastatin (LIPITOR) 10 MG tablet Take 1 tablet (10 mg total) by mouth daily. 07/28/22   Ivonne Andrew, NP  blood glucose meter kit and supplies KIT Dispense based on patient and insurance preference. Use up to four times daily as directed. (FOR ICD-9 250.00, 250.01). 05/16/17   Rai, Delene Ruffini, MD  Continuous Blood Gluc Sensor (FREESTYLE LIBRE 3 SENSOR) MISC Place 1 sensor on the skin every 14 days. Use to check glucose continuously. 05/21/22   Ivonne Andrew, NP  dicyclomine (BENTYL) 20 MG tablet Take 1 tablet (20 mg total) by mouth 2 (two) times daily. 09/15/20   Farrel Gordon, PA-C  furosemide (LASIX) 20 MG tablet Take 1 tablet (20 mg total) by mouth daily as needed. Patient taking differently: Take 20 mg by mouth daily. 04/24/20   Kallie Locks, FNP  glucose blood (ONETOUCH ULTRA) test strip Use up to four times daily as directed. 12/26/20   Barbette Merino, NP  ibuprofen (ADVIL) 800 MG tablet Take 1 tablet (800 mg total) by mouth every 8 (eight) hours as needed. 06/27/20   Kallie Locks, FNP   insulin aspart (NOVOLOG FLEXPEN) 100 UNIT/ML FlexPen Inject 6 Units into the skin 3 (three) times daily with meals. 09/01/22   Ivonne Andrew, NP  insulin detemir (LEVEMIR) 100 UNIT/ML FlexPen Inject 8 Units into the skin at bedtime. 09/01/22   Ivonne Andrew, NP  Insulin Syringes, Disposable, U-100 0.5 ML MISC Use with lantus and novolog vials. Patient not taking: Reported on 06/30/2022 05/16/17   Cathren Harsh, MD  Lancets MISC 1 each by Does not apply route 3 (three) times daily as needed. 12/22/19   Kallie Locks, FNP  ondansetron (ZOFRAN-ODT) 4 MG disintegrating tablet Take 1 tablet (4 mg total) by mouth every 8 (eight) hours as needed for nausea or vomiting. 01/02/22   Haskel Schroeder, PA-C  pantoprazole (PROTONIX) 20 MG tablet Take 1 tablet (20 mg total) by mouth daily. 01/02/22   Haskel Schroeder, PA-C  Semaglutide, 1 MG/DOSE, 4 MG/3ML SOPN Inject 1 mg as directed once a week. 09/01/22   Ivonne Andrew, NP      Allergies    Patient has no known allergies.    Review of Systems   Review of Systems  Constitutional:  Negative for fever.  HENT:  Negative for facial swelling.   Eyes:  Negative for redness.  Respiratory:  Negative for wheezing and stridor.   Gastrointestinal:  Positive for abdominal pain, nausea and vomiting. Negative  for diarrhea.  Genitourinary:  Negative for dysuria.  All other systems reviewed and are negative.   Physical Exam Updated Vital Signs BP (!) 147/95 (BP Location: Right Arm)   Pulse 82   Temp 98.4 F (36.9 C) (Oral)   Resp 19   SpO2 100%  Physical Exam Vitals and nursing note reviewed.  Constitutional:      General: He is not in acute distress.    Appearance: He is well-developed. He is not diaphoretic.  HENT:     Head: Normocephalic and atraumatic.     Nose: Nose normal.  Eyes:     Conjunctiva/sclera: Conjunctivae normal.     Pupils: Pupils are equal, round, and reactive to light.  Cardiovascular:     Rate and Rhythm: Normal  rate and regular rhythm.     Pulses: Normal pulses.     Heart sounds: Normal heart sounds.  Pulmonary:     Effort: Pulmonary effort is normal.     Breath sounds: Normal breath sounds. No wheezing or rales.  Abdominal:     General: Bowel sounds are normal.     Palpations: Abdomen is soft. There is no mass.     Tenderness: There is no abdominal tenderness. There is no guarding or rebound.     Hernia: No hernia is present.  Musculoskeletal:        General: Normal range of motion.     Cervical back: Normal range of motion and neck supple.  Skin:    General: Skin is warm and dry.     Capillary Refill: Capillary refill takes less than 2 seconds.  Neurological:     General: No focal deficit present.     Mental Status: He is alert and oriented to person, place, and time.  Psychiatric:        Thought Content: Thought content normal.     ED Results / Procedures / Treatments   Labs (all labs ordered are listed, but only abnormal results are displayed) Results for orders placed or performed during the hospital encounter of 10/17/22  Lipase, blood  Result Value Ref Range   Lipase 36 11 - 51 U/L  Comprehensive metabolic panel  Result Value Ref Range   Sodium 134 (L) 135 - 145 mmol/L   Potassium 3.5 3.5 - 5.1 mmol/L   Chloride 98 98 - 111 mmol/L   CO2 27 22 - 32 mmol/L   Glucose, Bld 128 (H) 70 - 99 mg/dL   BUN 16 6 - 20 mg/dL   Creatinine, Ser 6.21 (H) 0.61 - 1.24 mg/dL   Calcium 8.9 8.9 - 30.8 mg/dL   Total Protein 7.5 6.5 - 8.1 g/dL   Albumin 3.9 3.5 - 5.0 g/dL   AST 19 15 - 41 U/L   ALT 17 0 - 44 U/L   Alkaline Phosphatase 90 38 - 126 U/L   Total Bilirubin 0.9 0.3 - 1.2 mg/dL   GFR, Estimated 40 (L) >60 mL/min   Anion gap 9 5 - 15  CBC  Result Value Ref Range   WBC 6.7 4.0 - 10.5 K/uL   RBC 4.26 4.22 - 5.81 MIL/uL   Hemoglobin 13.3 13.0 - 17.0 g/dL   HCT 65.7 (L) 84.6 - 96.2 %   MCV 90.4 80.0 - 100.0 fL   MCH 31.2 26.0 - 34.0 pg   MCHC 34.5 30.0 - 36.0 g/dL   RDW 95.2  84.1 - 32.4 %   Platelets 220 150 - 400 K/uL   nRBC 0.0 0.0 -  0.2 %  Urinalysis, Routine w reflex microscopic -Urine, Clean Catch  Result Value Ref Range   Color, Urine YELLOW YELLOW   APPearance CLEAR CLEAR   Specific Gravity, Urine 1.016 1.005 - 1.030   pH 5.0 5.0 - 8.0   Glucose, UA NEGATIVE NEGATIVE mg/dL   Hgb urine dipstick NEGATIVE NEGATIVE   Bilirubin Urine NEGATIVE NEGATIVE   Ketones, ur NEGATIVE NEGATIVE mg/dL   Protein, ur 098 (A) NEGATIVE mg/dL   Nitrite NEGATIVE NEGATIVE   Leukocytes,Ua NEGATIVE NEGATIVE   RBC / HPF 0-5 0 - 5 RBC/hpf   WBC, UA 0-5 0 - 5 WBC/hpf   Bacteria, UA RARE (A) NONE SEEN   Squamous Epithelial / HPF 0-5 0 - 5 /HPF   Mucus PRESENT    Hyaline Casts, UA PRESENT    CT Renal Stone Study  Result Date: 10/17/2022 CLINICAL DATA:  Right lower quadrant pain. EXAM: CT ABDOMEN AND PELVIS WITHOUT CONTRAST TECHNIQUE: Multidetector CT imaging of the abdomen and pelvis was performed following the standard protocol without IV contrast. RADIATION DOSE REDUCTION: This exam was performed according to the departmental dose-optimization program which includes automated exposure control, adjustment of the mA and/or kV according to patient size and/or use of iterative reconstruction technique. COMPARISON:  None Available. FINDINGS: Lower chest: No acute abnormality. Hepatobiliary: No focal liver abnormality is seen. Status post cholecystectomy. No biliary dilatation. Pancreas: Unremarkable. No pancreatic ductal dilatation or surrounding inflammatory changes. Spleen: Normal in size without focal abnormality. Adrenals/Urinary Tract: Adrenal glands are unremarkable. 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 Kidneys are otherwise normal in size, without renal calculi, focal lesion, or hydronephrosis. The urinary bladder is poorly distended and subsequently limited in evaluation. Mild to moderate severity  predominantly anterior urinary bladder wall thickening is also seen. Stomach/Bowel: Stomach is within normal limits. Appendix appears normal. No evidence of bowel wall thickening, distention, or inflammatory changes. Vascular/Lymphatic: No significant vascular findings are present. No enlarged abdominal or pelvic lymph nodes. Reproductive: Prostate is unremarkable. Other: No abdominal wall hernia or abnormality. No abdominopelvic ascites. Musculoskeletal: Marked severity degenerative changes are seen at the levels of L4-L5 and L5-S1. IMPRESSION: 1. Evidence of prior cholecystectomy. 2. 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 3. Marked severity degenerative changes at the levels of L4-L5 and L5-S1. Electronically Signed   By: Aram Candela [name]   On: 10/17/2022 03:18     EKG EKG Interpretation  Date/Time:  Friday Oct 17 2022 02:40:12 EDT Ventricular Rate:  89 PR Interval:  157 QRS Duration: 88 QT Interval:  362 QTC Calculation: 441 R Axis:   -31 Text Interpretation: Sinus rhythm Left axis deviation Confirmed by Nicanor Alcon, Avett Reineck ([id number]) on 10/17/2022 3:21:26 AM  Radiology CT Renal Stone Study  Result Date: 10/17/2022 CLINICAL DATA:  Right lower quadrant pain. EXAM: CT ABDOMEN AND PELVIS WITHOUT CONTRAST TECHNIQUE: Multidetector CT imaging of the abdomen and pelvis was performed following the standard protocol without IV contrast. RADIATION DOSE REDUCTION: This exam was performed according to the departmental dose-optimization program which includes automated exposure control, adjustment of the mA and/or kV according to patient size and/or use of iterative reconstruction technique. COMPARISON:  None Available. FINDINGS: Lower chest: No acute abnormality. Hepatobiliary: No focal liver abnormality is seen. Status post cholecystectomy. No biliary dilatation. Pancreas: Unremarkable. No pancreatic ductal dilatation or surrounding inflammatory changes. Spleen: Normal in size without  focal abnormality. Adrenals/Urinary Tract: Adrenal glands are unremarkable. A congenitally malrotated and mildly malpositioned  right kidney is seen within the mid to lower right abdomen. Kidneys are otherwise normal in size, without renal calculi, focal lesion, or hydronephrosis. The urinary bladder is poorly distended and subsequently limited in evaluation. Mild to moderate severity predominantly anterior urinary bladder wall thickening is also seen. Stomach/Bowel: Stomach is within normal limits. Appendix appears normal. No evidence of bowel wall thickening, distention, or inflammatory changes. Vascular/Lymphatic: No significant vascular findings are present. No enlarged abdominal or pelvic lymph nodes. Reproductive: Prostate is unremarkable. Other: No abdominal wall hernia or abnormality. No abdominopelvic ascites. Musculoskeletal: Marked severity degenerative changes are seen at the levels of L4-L5 and L5-S1. IMPRESSION: 1. Evidence of prior cholecystectomy. 2. 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 3. Marked severity degenerative changes at the levels of L4-L5 and L5-S1. Electronically Signed   By: Aram Candela [name]   On: 10/17/2022 03:18    Procedures Procedures    Medications Ordered in ED Medications  droperidol (INAPSINE) 2.5 MG/ML injection 1.25 mg (1.25 mg Intravenous Given 10/17/22 0242)  sodium chloride 0.9 % bolus 500 mL (500 mLs Intravenous New Bag/Given 10/17/22 0254)    ED Course/ Medical Decision Making/ A&P                             Medical Decision Making Amount and/or Complexity of Data Reviewed Labs: ordered.    Details: Urine not consistent with UTI, sodium 134, normal potassium 3.5, elevated creatinine 2.03, normal LFTs, normal lipase  Radiology: ordered and independent interpretation performed.    Details: No stones by me   Risk Prescription drug management. Risk Details: Plan was pain medication and having patient follow up with [place]  [facility] for worsening CKD    Final Clinical Impression(s) / ED Diagnoses Final diagnoses:  Renal insufficiency   Patient left AMA informed by nurse  Rx / DC Orders ED Discharge Orders     None         Alaney Witter, MD 10/17/22 [phone]

## 2022-10-17 NOTE — ED Notes (Signed)
Pt in CT.

## 2022-10-17 NOTE — ED Notes (Signed)
Patient was c/o IV hurting him , IV was patent instructed patient to please keep line incase he was given pain med, stated he was leaving didn't want to stay. Patient signed AMA and left.

## 2022-10-17 NOTE — ED Triage Notes (Signed)
Pt c/o generalized abdominal pain worsening on the RLQ x 3 days. Pain described sharp and aching; is associated with nausea, bloating, cramping. Hx cholecystectomy. No urinary s/s. Denies difficulty with bowel movements.

## 2022-10-27 ENCOUNTER — Telehealth: Payer: Self-pay

## 2022-10-27 NOTE — Telephone Encounter (Signed)
Transition Care Management Unsuccessful Follow-up Telephone Call  Date of discharge and from where:  10/17/2022 [facility]  Attempts:  1st Attempt  Reason for unsuccessful TCM follow-up call:  Unable to reach patient  Elmo Shumard [facility]  [facility] Population Health Community Resource Care Guide   [email or web address]  ?? [phone]   Website: [email or web address]  [email or web address]

## 2022-10-28 ENCOUNTER — Telehealth: Payer: Self-pay

## 2022-10-28 NOTE — Telephone Encounter (Signed)
Transition Care Management Follow-up Telephone Call Date of discharge and from where: 10/17/2022 [facility] How have you been since you were released from the hospital? Patient is feeling better Any questions or concerns? No  Items Reviewed: Did the pt receive and understand the discharge instructions provided? Yes  Medications obtained and verified? Yes  Other? No  Any new allergies since your discharge? No  Dietary orders reviewed? Yes Do you have support at home? Yes   Follow up appointments reviewed:  PCP Hospital f/u appt confirmed? Yes  Scheduled to see Angus Seller NP on 10/29/2022 @ [facility]. Specialist Hospital f/u appt confirmed? No  Scheduled to see  on  @ . Are transportation arrangements needed? No  If their condition worsens, is the pt aware to call PCP or go to the Emergency Dept.? Yes Was the patient provided with contact information for the PCP's office or ED? Yes Was to pt encouraged to call back with questions or concerns? Yes  [name] [facility]  [facility] Population Health Community Resource Care Guide   [email or web address]  ?? [phone]   Website: [email or web address]  [email or web address]

## 2022-10-29 ENCOUNTER — Ambulatory Visit: Payer: Medicare HMO | Admitting: Nurse Practitioner

## 2022-11-12 ENCOUNTER — Encounter: Payer: Self-pay | Admitting: Nurse Practitioner

## 2022-11-12 ENCOUNTER — Ambulatory Visit (INDEPENDENT_AMBULATORY_CARE_PROVIDER_SITE_OTHER): Payer: Medicare HMO | Admitting: Nurse Practitioner

## 2022-11-12 VITALS — BP 145/84 | HR 84 | Ht 73.0 in | Wt 221.4 lb

## 2022-11-12 DIAGNOSIS — I10 Essential (primary) hypertension: Secondary | ICD-10-CM

## 2022-11-12 DIAGNOSIS — E119 Type 2 diabetes mellitus without complications: Secondary | ICD-10-CM | POA: Diagnosis not present

## 2022-11-12 DIAGNOSIS — Z13220 Encounter for screening for lipoid disorders: Secondary | ICD-10-CM

## 2022-11-12 DIAGNOSIS — R600 Localized edema: Secondary | ICD-10-CM

## 2022-11-12 DIAGNOSIS — Z13228 Encounter for screening for other metabolic disorders: Secondary | ICD-10-CM | POA: Diagnosis not present

## 2022-11-12 DIAGNOSIS — Z136 Encounter for screening for cardiovascular disorders: Secondary | ICD-10-CM | POA: Diagnosis not present

## 2022-11-12 LAB — POCT GLYCOSYLATED HEMOGLOBIN (HGB A1C): Hemoglobin A1C: 6.7 % — AB (ref 4.0–5.6)

## 2022-11-12 MED ORDER — SEMAGLUTIDE (1 MG/DOSE) 4 MG/3ML ~~LOC~~ SOPN: 1.0000 mg | PEN_INJECTOR | SUBCUTANEOUS | 1 refills | Status: DC

## 2022-11-12 MED ORDER — AMLODIPINE BESYLATE 10 MG PO TABS: 10.0000 mg | ORAL_TABLET | Freq: Every day | ORAL | 0 refills | Status: DC

## 2022-11-12 MED ORDER — PANTOPRAZOLE SODIUM 20 MG PO TBEC: 20.0000 mg | DELAYED_RELEASE_TABLET | Freq: Every day | ORAL | 0 refills | Status: DC

## 2022-11-12 MED ORDER — FUROSEMIDE 20 MG PO TABS
20.0000 mg | ORAL_TABLET | Freq: Every day | ORAL | 3 refills | Status: AC | PRN
Start: 2022-11-12 — End: ?

## 2022-11-12 MED ORDER — ATORVASTATIN CALCIUM 10 MG PO TABS: 10.0000 mg | ORAL_TABLET | Freq: Every day | ORAL | 0 refills | Status: DC

## 2022-11-12 NOTE — Assessment & Plan Note (Signed)
-   POCT glycosylated hemoglobin (Hb A1C)  2. Lipid screening  - Lipid Panel   Follow up:  Follow up in 3 months
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
## 2022-11-12 NOTE — Patient Instructions (Signed)
1. Controlled type 2 diabetes mellitus without complication, without long-term current use of insulin (HCC)  - POCT glycosylated hemoglobin (Hb A1C)  2. Lipid screening  - Lipid Panel   Follow up:  Follow up in 3 months

## 2022-11-13 LAB — LIPID PANEL
Chol/HDL Ratio: 3.1 ratio (ref 0.0–5.0)
Cholesterol, Total: 132 mg/dL (ref 100–199)
HDL: 43 mg/dL (ref >39)
LDL Chol Calc (NIH): 65 mg/dL (ref 0–99)
Triglycerides: 137 mg/dL (ref 0–149)
VLDL Cholesterol Cal: 24 mg/dL (ref 5–40)

## 2022-11-24 DIAGNOSIS — I129 Hypertensive chronic kidney disease with stage 1 through stage 4 chronic kidney disease, or unspecified chronic kidney disease: Secondary | ICD-10-CM | POA: Diagnosis not present

## 2022-11-24 DIAGNOSIS — N2581 Secondary hyperparathyroidism of renal origin: Secondary | ICD-10-CM | POA: Diagnosis not present

## 2022-11-24 DIAGNOSIS — D631 Anemia in chronic kidney disease: Secondary | ICD-10-CM | POA: Diagnosis not present

## 2022-11-24 DIAGNOSIS — R809 Proteinuria, unspecified: Secondary | ICD-10-CM | POA: Diagnosis not present

## 2022-11-24 DIAGNOSIS — N1832 Chronic kidney disease, stage 3b: Secondary | ICD-10-CM | POA: Diagnosis not present

## 2022-11-24 DIAGNOSIS — E785 Hyperlipidemia, unspecified: Secondary | ICD-10-CM | POA: Diagnosis not present

## 2022-12-03 DIAGNOSIS — N1832 Chronic kidney disease, stage 3b: Secondary | ICD-10-CM | POA: Diagnosis not present

## 2022-12-04 ENCOUNTER — Ambulatory Visit (INDEPENDENT_AMBULATORY_CARE_PROVIDER_SITE_OTHER): Payer: Medicare HMO

## 2022-12-04 VITALS — Ht 73.0 in | Wt 220.0 lb

## 2022-12-04 DIAGNOSIS — Z0000 Encounter for general adult medical examination without abnormal findings: Secondary | ICD-10-CM

## 2022-12-04 NOTE — Patient Instructions (Signed)
[name] , Thank you for taking time to come for your Medicare Wellness Visit. I appreciate your ongoing commitment to your health goals. Please review the following plan we discussed and let me know if I can assist you in the future.   These are the goals we discussed:  Goals      Patient Stated     Patient states his goal is to lose 40 lbs         This is a list of the screening recommended for you and due dates:  Health Maintenance  Topic Date Due   COVID-19 Vaccine (1) Never done   Complete foot exam   12/26/2021   Eye exam for diabetics  03/05/2022   Flu Shot  01/08/2023   Hemoglobin A1C  05/14/2023   Yearly kidney health urinalysis for diabetes  07/29/2023   Yearly kidney function blood test for diabetes  10/17/2023   Medicare Annual Wellness Visit  12/04/2023   DTaP/Tdap/Td vaccine (2 - Td or Tdap) 02/26/2026   Hepatitis C Screening  Completed   HIV Screening  Completed   HPV Vaccine  Aged Out    Advanced directives: Advance directive discussed with you today. Even though you declined this today, please call our office should you change your mind, and we can give you the proper paperwork for you to fill out. Advance care planning is a way to make decisions about medical care that fits your values in case you are ever unable to make these decisions for yourself.  Information on Advanced Care Planning can be found at [facility] [place] Advance Health Care Directives Advance Health Care Directives ([email or web address]    Conditions/risks identified: Aim for 30 minutes of exercise or brisk walking, 6-8 glasses of water, and 5 servings of fruits and vegetables each day.   Next appointment: VIRTUAL/ TELEPHONE VISIT Follow up in one year for your annual wellness visit  December 10, 2023 at 10am telephone visit.    Preventive Care 40-64 Years, Male Preventive care refers to lifestyle choices and visits with your health care provider that can promote health and  wellness. What does preventive care include? A yearly physical exam. This is also called an annual well check. Dental exams once or twice a year. Routine eye exams. Ask your health care provider how often you should have your eyes checked. Personal lifestyle choices, including: Daily care of your teeth and gums. Regular physical activity. Eating a healthy diet. Avoiding tobacco and drug use. Limiting alcohol use. Practicing safe sex. Taking low-dose aspirin every day starting at age 87. What happens during an annual well check? The services and screenings done by your health care provider during your annual well check will depend on your age, overall health, lifestyle risk factors, and family history of disease. Counseling  Your health care provider may ask you questions about your: Alcohol use. Tobacco use. Drug use. Emotional well-being. Home and relationship well-being. Sexual activity. Eating habits. Work and work Astronomer. Screening  You may have the following tests or measurements: Height, weight, and BMI. Blood pressure. Lipid and cholesterol levels. These may be checked every 5 years, or more frequently if you are over 76 years old. Skin check. Lung cancer screening. You may have this screening every year starting at age 14 if you have a 30-pack-year history of smoking and currently smoke or have quit within the past 15 years. Fecal occult blood test (FOBT) of the stool. You may have this test every year  starting at age 31. Flexible sigmoidoscopy or colonoscopy. You may have a sigmoidoscopy every 5 years or a colonoscopy every 10 years starting at age 24. Prostate cancer screening. Recommendations will vary depending on your family history and other risks. Hepatitis C blood test. Hepatitis B blood test. Sexually transmitted disease (STD) testing. Diabetes screening. This is done by checking your blood sugar (glucose) after you have not eaten for a while (fasting). You  may have this done every 1-3 years. Discuss your test results, treatment options, and if necessary, the need for more tests with your health care provider. Vaccines  Your health care provider may recommend certain vaccines, such as: Influenza vaccine. This is recommended every year. Tetanus, diphtheria, and acellular pertussis (Tdap, Td) vaccine. You may need a Td booster every 10 years. Zoster vaccine. You may need this after age 51. Pneumococcal 13-valent conjugate (PCV13) vaccine. You may need this if you have certain conditions and have not been vaccinated. Pneumococcal polysaccharide (PPSV23) vaccine. You may need one or two doses if you smoke cigarettes or if you have certain conditions. Talk to your health care provider about which screenings and vaccines you need and how often you need them. This information is not intended to replace advice given to you by your health care provider. Make sure you discuss any questions you have with your health care provider. Document Released: 06/22/2015 Document Revised: 02/13/2016 Document Reviewed: 03/27/2015 Elsevier Interactive Patient Education  2017 [employer].  Fall Prevention in the Home Falls can cause injuries. They can happen to people of all ages. There are many things you can do to make your home safe and to help prevent falls. What can I do on the outside of my home? Regularly fix the edges of walkways and driveways and fix any cracks. Remove anything that might make you trip as you walk through a door, such as a raised step or threshold. Trim any bushes or trees on the path to your home. Use bright outdoor lighting. Clear any walking paths of anything that might make someone trip, such as rocks or tools. Regularly check to see if handrails are loose or broken. Make sure that both sides of any steps have handrails. Any raised decks and porches should have guardrails on the edges. Have any leaves, snow, or ice cleared regularly. Use  sand or salt on walking paths during winter. Clean up any spills in your garage right away. This includes oil or grease spills. What can I do in the bathroom? Use night lights. Install grab bars by the toilet and in the tub and shower. Do not use towel bars as grab bars. Use non-skid mats or decals in the tub or shower. If you need to sit down in the shower, use a plastic, non-slip stool. Keep the floor dry. Clean up any water that spills on the floor as soon as it happens. Remove soap buildup in the tub or shower regularly. Attach bath mats securely with double-sided non-slip rug tape. Do not have throw rugs and other things on the floor that can make you trip. What can I do in the bedroom? Use night lights. Make sure that you have a light by your bed that is easy to reach. Do not use any sheets or blankets that are too big for your bed. They should not hang down onto the floor. Have a firm chair that has side arms. You can use this for support while you get dressed. Do not have throw rugs and  other things on the floor that can make you trip. What can I do in the kitchen? Clean up any spills right away. Avoid walking on wet floors. Keep items that you use a lot in easy-to-reach places. If you need to reach something above you, use a strong step stool that has a grab bar. Keep electrical cords out of the way. Do not use floor polish or wax that makes floors slippery. If you must use wax, use non-skid floor wax. Do not have throw rugs and other things on the floor that can make you trip. What can I do with my stairs? Do not leave any items on the stairs. Make sure that there are handrails on both sides of the stairs and use them. Fix handrails that are broken or loose. Make sure that handrails are as long as the stairways. Check any carpeting to make sure that it is firmly attached to the stairs. Fix any carpet that is loose or worn. Avoid having throw rugs at the top or bottom of the  stairs. If you do have throw rugs, attach them to the floor with carpet tape. Make sure that you have a light switch at the top of the stairs and the bottom of the stairs. If you do not have them, ask someone to add them for you. What else can I do to help prevent falls? Wear shoes that: Do not have high heels. Have rubber bottoms. Are comfortable and fit you well. Are closed at the toe. Do not wear sandals. If you use a stepladder: Make sure that it is fully opened. Do not climb a closed stepladder. Make sure that both sides of the stepladder are locked into place. Ask someone to hold it for you, if possible. Clearly mark and make sure that you can see: Any grab bars or handrails. First and last steps. Where the edge of each step is. Use tools that help you move around (mobility aids) if they are needed. These include: Canes. Walkers. Scooters. Crutches. Turn on the lights when you go into a dark area. Replace any light bulbs as soon as they burn out. Set up your furniture so you have a clear path. Avoid moving your furniture around. If any of your floors are uneven, fix them. If there are any pets around you, be aware of where they are. Review your medicines with your doctor. Some medicines can make you feel dizzy. This can increase your chance of falling. Ask your doctor what other things that you can do to help prevent falls. This information is not intended to replace advice given to you by your health care provider. Make sure you discuss any questions you have with your health care provider. Document Released: 03/22/2009 Document Revised: 11/01/2015 Document Reviewed: 06/30/2014 Elsevier Interactive Patient Education  2017 [employer].
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
## 2022-12-16 DIAGNOSIS — Z89511 Acquired absence of right leg below knee: Secondary | ICD-10-CM | POA: Diagnosis not present

## 2022-12-26 ENCOUNTER — Ambulatory Visit: Payer: Medicare HMO | Admitting: Podiatry

## 2022-12-29 ENCOUNTER — Ambulatory Visit: Payer: Medicare HMO | Admitting: Podiatry

## 2023-02-12 ENCOUNTER — Ambulatory Visit (INDEPENDENT_AMBULATORY_CARE_PROVIDER_SITE_OTHER): Payer: Medicare HMO | Admitting: Nurse Practitioner

## 2023-02-12 ENCOUNTER — Encounter: Payer: Self-pay | Admitting: Nurse Practitioner

## 2023-02-12 VITALS — BP 137/83 | HR 77 | Temp 97.0°F | Wt 216.0 lb

## 2023-02-12 DIAGNOSIS — E119 Type 2 diabetes mellitus without complications: Secondary | ICD-10-CM

## 2023-02-12 DIAGNOSIS — I10 Essential (primary) hypertension: Secondary | ICD-10-CM

## 2023-02-12 LAB — POCT GLYCOSYLATED HEMOGLOBIN (HGB A1C): Hemoglobin A1C: 6.1 % — AB (ref 4.0–5.6)

## 2023-02-12 LAB — HM DIABETES EYE EXAM

## 2023-02-12 MED ORDER — LOSARTAN POTASSIUM 25 MG PO TABS: 25.0000 mg | ORAL_TABLET | Freq: Every day | ORAL | 2 refills | Status: DC

## 2023-02-12 MED ORDER — ATORVASTATIN CALCIUM 10 MG PO TABS: 10.0000 mg | ORAL_TABLET | Freq: Every day | ORAL | 0 refills | Status: DC

## 2023-02-12 MED ORDER — AMLODIPINE BESYLATE 10 MG PO TABS: 10.0000 mg | ORAL_TABLET | Freq: Every day | ORAL | 0 refills | Status: DC

## 2023-02-12 MED ORDER — SEMAGLUTIDE (1 MG/DOSE) 4 MG/3ML ~~LOC~~ SOPN: 1.0000 mg | PEN_INJECTOR | SUBCUTANEOUS | 1 refills | Status: DC

## 2023-02-12 MED ORDER — PANTOPRAZOLE SODIUM 20 MG PO TBEC: 20.0000 mg | DELAYED_RELEASE_TABLET | Freq: Every day | ORAL | 0 refills | Status: DC

## 2023-02-12 NOTE — Patient Instructions (Addendum)
1. Controlled type 2 diabetes mellitus without complication, without long-term current use of insulin (HCC)  - POCT glycosylated hemoglobin (Hb A1C)   2. Hypertension, unspecified type  - atorvastatin (LIPITOR) 10 MG tablet; Take 1 tablet (10 mg total) by mouth daily.  Dispense: 90 tablet; Refill: 0 - amLODipine (NORVASC) 10 MG tablet; Take 1 tablet (10 mg total) by mouth daily.  Dispense: 90 tablet; Refill: 0    Follow up:  Follow up in 6 months

## 2023-02-12 NOTE — Assessment & Plan Note (Signed)
-   POCT glycosylated hemoglobin (Hb A1C)   2. Hypertension, unspecified type  - atorvastatin (LIPITOR) 10 MG tablet; Take 1 tablet (10 mg total) by mouth daily.  Dispense: 90 tablet; Refill: 0 - amLODipine (NORVASC) 10 MG tablet; Take 1 tablet (10 mg total) by mouth daily.  Dispense: 90 tablet; Refill: 0    Follow up:  Follow up in 6 months
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
## 2023-05-16 ENCOUNTER — Other Ambulatory Visit: Payer: Self-pay | Admitting: Nurse Practitioner

## 2023-05-16 DIAGNOSIS — I10 Essential (primary) hypertension: Secondary | ICD-10-CM

## 2023-05-20 DIAGNOSIS — E785 Hyperlipidemia, unspecified: Secondary | ICD-10-CM | POA: Diagnosis not present

## 2023-05-20 DIAGNOSIS — N1832 Chronic kidney disease, stage 3b: Secondary | ICD-10-CM | POA: Diagnosis not present

## 2023-05-20 DIAGNOSIS — N189 Chronic kidney disease, unspecified: Secondary | ICD-10-CM | POA: Diagnosis not present

## 2023-05-20 DIAGNOSIS — I129 Hypertensive chronic kidney disease with stage 1 through stage 4 chronic kidney disease, or unspecified chronic kidney disease: Secondary | ICD-10-CM | POA: Diagnosis not present

## 2023-05-20 DIAGNOSIS — R809 Proteinuria, unspecified: Secondary | ICD-10-CM | POA: Diagnosis not present

## 2023-05-20 DIAGNOSIS — N2581 Secondary hyperparathyroidism of renal origin: Secondary | ICD-10-CM | POA: Diagnosis not present

## 2023-08-03 ENCOUNTER — Ambulatory Visit (INDEPENDENT_AMBULATORY_CARE_PROVIDER_SITE_OTHER): Payer: Medicare PPO | Admitting: Orthopedic Surgery

## 2023-08-03 DIAGNOSIS — Z89511 Acquired absence of right leg below knee: Secondary | ICD-10-CM

## 2023-08-04 ENCOUNTER — Encounter: Payer: Self-pay | Admitting: Orthopedic Surgery
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
## 2023-08-10 ENCOUNTER — Ambulatory Visit (INDEPENDENT_AMBULATORY_CARE_PROVIDER_SITE_OTHER): Admitting: Orthopedic Surgery

## 2023-08-10 DIAGNOSIS — Z89511 Acquired absence of right leg below knee: Secondary | ICD-10-CM

## 2023-08-10 MED ORDER — OXYCODONE-ACETAMINOPHEN 5-325 MG PO TABS: 1.0000 | ORAL_TABLET | Freq: Three times a day (TID) | ORAL | 0 refills | Status: AC | PRN

## 2023-08-10 MED ORDER — DOXYCYCLINE HYCLATE 100 MG PO TABS: 100.0000 mg | ORAL_TABLET | Freq: Two times a day (BID) | ORAL | 0 refills | Status: DC

## 2023-08-12 ENCOUNTER — Ambulatory Visit: Payer: Self-pay | Admitting: Nurse Practitioner

## 2023-08-24 ENCOUNTER — Other Ambulatory Visit: Payer: Self-pay

## 2023-08-24 ENCOUNTER — Ambulatory Visit: Admitting: Orthopedic Surgery

## 2023-08-25 ENCOUNTER — Other Ambulatory Visit: Payer: Self-pay | Admitting: Nurse Practitioner

## 2023-08-25 ENCOUNTER — Telehealth: Payer: Self-pay

## 2023-08-25 ENCOUNTER — Encounter: Payer: Self-pay | Admitting: Orthopedic Surgery

## 2023-08-25 NOTE — Telephone Encounter (Signed)
 Pharmacy Patient Advocate Encounter  Received notification from [facility] that Prior Authorization for ozempic has been APPROVED from 06/10/2023 to 06/08/2024   PA #/Case ID/Reference #: [id number]  NFAOZHY HAS BEEN NOTIFIED.
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
## 2023-08-25 NOTE — Telephone Encounter (Unsigned)
 Copied from CRM [id number]. Topic: Clinical - Medication Refill >> Aug 25, 2023  8:59 AM Shon Hale wrote: Most Recent Primary Care Visit:  Provider: Ivonne Andrew  Department: [facility]  Visit Type: OFFICE VISIT  Date: 02/12/2023  Medication: Semaglutide, 1 MG/DOSE, 4 MG/3ML SOPN Patient scheduled appointment for 10/09/23 and has been added to wait list. Patient requesting courtesy refill.   Has the patient contacted their pharmacy? Yes  Is this the correct pharmacy for this prescription? Yes This is the patient's preferred pharmacy:  [id number] - [place]), [place] - [address] [place] [address] Phone: [phone] Fax: [phone]   Has the prescription been filled recently? No  Is the patient out of the medication? Yes  Has the patient been seen for an appointment in the last year OR does the patient have an upcoming appointment? Yes  Can we respond through MyChart? No  Agent: Please be advised that Rx refills may take up to 3 business days. We ask that you follow-up with your pharmacy.

## 2023-08-26 MED ORDER — SEMAGLUTIDE (1 MG/DOSE) 4 MG/3ML ~~LOC~~ SOPN: 1.0000 mg | PEN_INJECTOR | SUBCUTANEOUS | 1 refills | Status: DC

## 2023-09-10 ENCOUNTER — Ambulatory Visit: Admitting: Orthopedic Surgery

## 2023-09-16 DIAGNOSIS — E1122 Type 2 diabetes mellitus with diabetic chronic kidney disease: Secondary | ICD-10-CM | POA: Diagnosis not present

## 2023-09-16 DIAGNOSIS — I129 Hypertensive chronic kidney disease with stage 1 through stage 4 chronic kidney disease, or unspecified chronic kidney disease: Secondary | ICD-10-CM | POA: Diagnosis not present

## 2023-09-16 DIAGNOSIS — N1831 Chronic kidney disease, stage 3a: Secondary | ICD-10-CM | POA: Diagnosis not present

## 2023-09-16 DIAGNOSIS — N2581 Secondary hyperparathyroidism of renal origin: Secondary | ICD-10-CM | POA: Diagnosis not present

## 2023-09-16 DIAGNOSIS — D631 Anemia in chronic kidney disease: Secondary | ICD-10-CM | POA: Diagnosis not present

## 2023-09-16 DIAGNOSIS — E785 Hyperlipidemia, unspecified: Secondary | ICD-10-CM | POA: Diagnosis not present

## 2023-09-16 DIAGNOSIS — N189 Chronic kidney disease, unspecified: Secondary | ICD-10-CM | POA: Diagnosis not present

## 2023-09-24 ENCOUNTER — Ambulatory Visit: Admitting: Orthopedic Surgery

## 2023-09-28 DIAGNOSIS — N1831 Chronic kidney disease, stage 3a: Secondary | ICD-10-CM | POA: Diagnosis not present

## 2023-10-05 ENCOUNTER — Ambulatory Visit: Admitting: Orthopedic Surgery

## 2023-10-09 ENCOUNTER — Telehealth: Payer: Self-pay

## 2023-10-09 ENCOUNTER — Ambulatory Visit: Payer: Self-pay | Admitting: Nurse Practitioner

## 2023-10-09 NOTE — Telephone Encounter (Unsigned)
 Copied from CRM [id number]. Topic: Clinical - Medication Question >> Oct 09, 2023  8:29 AM Sophia H wrote: Reason for CRM: Pt has a conflict and  is unable to make it to his appointment today, rescheduled for 05/27. Wanted to let you know he is out of all of his medications. Please reach out.

## 2023-10-19 ENCOUNTER — Other Ambulatory Visit: Payer: Self-pay | Admitting: Nurse Practitioner

## 2023-10-19 DIAGNOSIS — I10 Essential (primary) hypertension: Secondary | ICD-10-CM

## 2023-10-20 ENCOUNTER — Other Ambulatory Visit: Payer: Self-pay | Admitting: Nurse Practitioner

## 2023-10-20 DIAGNOSIS — I10 Essential (primary) hypertension: Secondary | ICD-10-CM

## 2023-10-28 ENCOUNTER — Telehealth: Payer: Self-pay | Admitting: Nurse Practitioner

## 2023-10-28 NOTE — Telephone Encounter (Signed)
 Patient was identified as falling into the [place] Measure - Diabetes.   Patient was: Appointment already scheduled for:  11/03/2023.

## 2023-11-03 ENCOUNTER — Encounter: Payer: Self-pay | Admitting: Nurse Practitioner

## 2023-11-03 ENCOUNTER — Ambulatory Visit (INDEPENDENT_AMBULATORY_CARE_PROVIDER_SITE_OTHER): Payer: Self-pay | Admitting: Nurse Practitioner

## 2023-11-03 VITALS — BP 141/82 | HR 76 | Temp 97.7°F | Wt 204.4 lb

## 2023-11-03 DIAGNOSIS — I10 Essential (primary) hypertension: Secondary | ICD-10-CM | POA: Diagnosis not present

## 2023-11-03 DIAGNOSIS — E119 Type 2 diabetes mellitus without complications: Secondary | ICD-10-CM | POA: Diagnosis not present

## 2023-11-03 DIAGNOSIS — Z13220 Encounter for screening for lipoid disorders: Secondary | ICD-10-CM

## 2023-11-03 LAB — POCT GLYCOSYLATED HEMOGLOBIN (HGB A1C): Hemoglobin A1C: 5.6 % (ref 4.0–5.6)

## 2023-11-03 MED ORDER — AMLODIPINE BESYLATE 10 MG PO TABS: 10.0000 mg | ORAL_TABLET | Freq: Every day | ORAL | 0 refills | Status: DC

## 2023-11-03 MED ORDER — LOSARTAN POTASSIUM 25 MG PO TABS: 25.0000 mg | ORAL_TABLET | Freq: Every day | ORAL | 2 refills | Status: AC

## 2023-11-03 MED ORDER — ATORVASTATIN CALCIUM 10 MG PO TABS: 10.0000 mg | ORAL_TABLET | Freq: Every day | ORAL | 0 refills | Status: DC

## 2023-11-03 MED ORDER — PANTOPRAZOLE SODIUM 20 MG PO TBEC: 20.0000 mg | DELAYED_RELEASE_TABLET | Freq: Every day | ORAL | 0 refills | Status: AC

## 2023-11-03 MED ORDER — SEMAGLUTIDE (1 MG/DOSE) 4 MG/3ML ~~LOC~~ SOPN: 1.0000 mg | PEN_INJECTOR | SUBCUTANEOUS | 1 refills | Status: AC
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
## 2023-11-03 NOTE — Patient Instructions (Signed)
 1. Controlled type 2 diabetes mellitus without complication, without long-term current use of insulin  (HCC)  - Renal function panel [LAB19] - POCT glycosylated hemoglobin (Hb A1C) - Microalbumin/Creatinine Ratio, Urine - CBC - Comprehensive metabolic panel with GFR - Lipid Panel  2. Hypertension, unspecified type  - amLODipine  (NORVASC ) 10 MG tablet; Take 1 tablet (10 mg total) by mouth daily.  Dispense: 90 tablet; Refill: 0 - atorvastatin  (LIPITOR) 10 MG tablet; Take 1 tablet (10 mg total) by mouth daily.  Dispense: 90 tablet; Refill: 0  3. Lipid screening (Primary)  - Lipid Panel

## 2023-11-03 NOTE — Addendum Note (Signed)
 Addended by: Ammon Bales D on: 11/03/2023 11:39 AM   Modules accepted: Orders

## 2023-11-04 ENCOUNTER — Ambulatory Visit: Payer: Self-pay | Admitting: Nurse Practitioner

## 2023-11-04 DIAGNOSIS — N289 Disorder of kidney and ureter, unspecified: Secondary | ICD-10-CM

## 2023-11-04 LAB — COMPREHENSIVE METABOLIC PANEL WITH GFR
ALT: 13 IU/L (ref 0–44)
AST: 18 IU/L (ref 0–40)
Albumin: 4.4 g/dL (ref 4.1–5.1)
Alkaline Phosphatase: 118 IU/L (ref 44–121)
BUN/Creatinine Ratio: 6 — ABNORMAL LOW (ref 9–20)
BUN: 11 mg/dL (ref 6–24)
Bilirubin Total: 0.3 mg/dL (ref 0.0–1.2)
CO2: 24 mmol/L (ref 20–29)
Calcium: 9.5 mg/dL (ref 8.7–10.2)
Chloride: 102 mmol/L (ref 96–106)
Creatinine, Ser: 1.72 mg/dL — ABNORMAL HIGH (ref 0.76–1.27)
Globulin, Total: 2.9 g/dL (ref 1.5–4.5)
Glucose: 97 mg/dL (ref 70–99)
Potassium: 4.4 mmol/L (ref 3.5–5.2)
Sodium: 139 mmol/L (ref 134–144)
Total Protein: 7.3 g/dL (ref 6.0–8.5)
eGFR: 49 mL/min/{1.73_m2} — ABNORMAL LOW (ref >59)

## 2023-11-04 LAB — CBC
Hematocrit: 40.9 % (ref 37.5–51.0)
Hemoglobin: 13.2 g/dL (ref 13.0–17.7)
MCH: 30 pg (ref 26.6–33.0)
MCHC: 32.3 g/dL (ref 31.5–35.7)
MCV: 93 fL (ref 79–97)
Platelets: 222 10*3/uL (ref 150–450)
RBC: 4.4 x10E6/uL (ref 4.14–5.80)
RDW: 14.2 % (ref 11.6–15.4)
WBC: 7.7 10*3/uL (ref 3.4–10.8)

## 2023-11-04 LAB — LIPID PANEL
Chol/HDL Ratio: 2.8 ratio (ref 0.0–5.0)
Cholesterol, Total: 134 mg/dL (ref 100–199)
HDL: 48 mg/dL (ref >39)
LDL Chol Calc (NIH): 69 mg/dL (ref 0–99)
Triglycerides: 89 mg/dL (ref 0–149)
VLDL Cholesterol Cal: 17 mg/dL (ref 5–40)

## 2023-11-04 LAB — MICROALBUMIN / CREATININE URINE RATIO
Creatinine, Urine: 143.1 mg/dL
Microalb/Creat Ratio: 87 mg/g{creat} — ABNORMAL HIGH (ref 0–29)
Microalbumin, Urine: 124.3 ug/mL

## 2024-01-13 ENCOUNTER — Other Ambulatory Visit: Payer: Self-pay | Admitting: Nurse Practitioner

## 2024-01-13 DIAGNOSIS — I10 Essential (primary) hypertension: Secondary | ICD-10-CM

## 2024-02-03 ENCOUNTER — Ambulatory Visit: Payer: Self-pay | Admitting: Nurse Practitioner

## 2024-03-07 ENCOUNTER — Ambulatory Visit: Payer: Self-pay | Admitting: Nurse Practitioner

## 2024-03-16 ENCOUNTER — Ambulatory Visit: Payer: Self-pay | Admitting: Nurse Practitioner

## 2024-03-29 ENCOUNTER — Ambulatory Visit: Payer: Self-pay

## 2024-04-20 ENCOUNTER — Ambulatory Visit (INDEPENDENT_AMBULATORY_CARE_PROVIDER_SITE_OTHER): Admitting: Family

## 2024-04-20 DIAGNOSIS — Z89511 Acquired absence of right leg below knee: Secondary | ICD-10-CM

## 2024-04-22 ENCOUNTER — Encounter: Payer: Self-pay | Admitting: Family
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
## 2024-04-26 ENCOUNTER — Ambulatory Visit: Admitting: Physician Assistant

## 2024-04-26 ENCOUNTER — Encounter: Payer: Self-pay | Admitting: Physician Assistant

## 2024-04-26 DIAGNOSIS — T8781 Dehiscence of amputation stump: Secondary | ICD-10-CM

## 2024-04-26 MED ORDER — DOXYCYCLINE HYCLATE 100 MG PO TABS
100.0000 mg | ORAL_TABLET | Freq: Two times a day (BID) | ORAL | 0 refills | Status: AC
Start: 2024-04-26 — End: ?
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
## 2024-05-13 ENCOUNTER — Ambulatory Visit: Admitting: Family

## 2024-05-20 ENCOUNTER — Ambulatory Visit: Admitting: Family

## 2024-05-24 ENCOUNTER — Ambulatory Visit: Admitting: Family

## 2024-06-20 ENCOUNTER — Encounter (HOSPITAL_COMMUNITY): Payer: Self-pay

## 2024-06-20 ENCOUNTER — Emergency Department (HOSPITAL_COMMUNITY)

## 2024-06-20 ENCOUNTER — Other Ambulatory Visit: Payer: Self-pay

## 2024-06-20 ENCOUNTER — Emergency Department (HOSPITAL_COMMUNITY)
Admission: EM | Admit: 2024-06-20 | Discharge: 2024-06-21 | Attending: Emergency Medicine | Admitting: Emergency Medicine

## 2024-06-20 DIAGNOSIS — M79604 Pain in right leg: Secondary | ICD-10-CM | POA: Diagnosis present

## 2024-06-20 DIAGNOSIS — R519 Headache, unspecified: Secondary | ICD-10-CM | POA: Insufficient documentation

## 2024-06-20 DIAGNOSIS — Z5321 Procedure and treatment not carried out due to patient leaving prior to being seen by health care provider: Secondary | ICD-10-CM | POA: Diagnosis not present

## 2024-06-20 DIAGNOSIS — H538 Other visual disturbances: Secondary | ICD-10-CM | POA: Insufficient documentation

## 2024-06-20 DIAGNOSIS — Y92410 Unspecified street and highway as the place of occurrence of the external cause: Secondary | ICD-10-CM | POA: Diagnosis not present

## 2024-06-20 NOTE — ED Notes (Signed)
 Called PT three times for vitals check and no response .SABRASABRASABRA
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
## 2024-06-21 NOTE — ED Notes (Signed)
No response for vitals  

## 2024-06-25 ENCOUNTER — Other Ambulatory Visit: Payer: Self-pay | Admitting: Nurse Practitioner

## 2024-06-25 DIAGNOSIS — I10 Essential (primary) hypertension: Secondary | ICD-10-CM

## 2024-06-27 ENCOUNTER — Ambulatory Visit: Admitting: Physician Assistant

## 2024-06-27 ENCOUNTER — Encounter: Payer: Self-pay | Admitting: Physician Assistant

## 2024-06-27 DIAGNOSIS — T8789 Other complications of amputation stump: Secondary | ICD-10-CM

## 2024-06-27 DIAGNOSIS — M79604 Pain in right leg: Secondary | ICD-10-CM | POA: Diagnosis not present

## 2024-06-27 DIAGNOSIS — T148XXA Other injury of unspecified body region, initial encounter: Secondary | ICD-10-CM | POA: Diagnosis not present

## 2024-06-27 MED ORDER — TRAMADOL HCL 50 MG PO TABS: 50.0000 mg | ORAL_TABLET | Freq: Four times a day (QID) | ORAL | 0 refills | Status: AC | PRN
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
## 2024-06-27 NOTE — Telephone Encounter (Signed)
 atorvastatin  (LIPITOR) 10 MG tablet [Pharmacy Med Name: Atorvastatin  Calcium  10 MG Oral Tablet]

## 2024-06-30 ENCOUNTER — Other Ambulatory Visit: Payer: Self-pay | Admitting: Nurse Practitioner

## 2024-06-30 DIAGNOSIS — I10 Essential (primary) hypertension: Secondary | ICD-10-CM

## 2024-06-30 NOTE — Telephone Encounter (Signed)
 amLODipine  (NORVASC ) 10 MG tablet [Pharmacy Med Name: amLODIPine  Besylate 10 MG Oral Tablet]

## 2024-07-25 ENCOUNTER — Ambulatory Visit: Admitting: Physician Assistant
